# Patient Record
Sex: Female | Born: 1945
Health system: Southern US, Community
[De-identification: ages and names within clinical notes are randomized; demographics above are authoritative.]

## PROBLEM LIST (undated history)

## (undated) DIAGNOSIS — I252 Old myocardial infarction: Secondary | ICD-10-CM

## (undated) DIAGNOSIS — I200 Unstable angina: Secondary | ICD-10-CM

## (undated) DIAGNOSIS — E669 Obesity, unspecified: Secondary | ICD-10-CM

## (undated) DIAGNOSIS — R519 Headache, unspecified: Secondary | ICD-10-CM

## (undated) DIAGNOSIS — I219 Acute myocardial infarction, unspecified: Secondary | ICD-10-CM

## (undated) DIAGNOSIS — F329 Major depressive disorder, single episode, unspecified: Secondary | ICD-10-CM

## (undated) DIAGNOSIS — R112 Nausea with vomiting, unspecified: Secondary | ICD-10-CM

## (undated) DIAGNOSIS — I2510 Atherosclerotic heart disease of native coronary artery without angina pectoris: Secondary | ICD-10-CM

## (undated) DIAGNOSIS — Z9289 Personal history of other medical treatment: Secondary | ICD-10-CM

## (undated) DIAGNOSIS — R739 Hyperglycemia, unspecified: Secondary | ICD-10-CM

## (undated) DIAGNOSIS — D649 Anemia, unspecified: Secondary | ICD-10-CM

## (undated) DIAGNOSIS — F32A Depression, unspecified: Secondary | ICD-10-CM

## (undated) DIAGNOSIS — H269 Unspecified cataract: Secondary | ICD-10-CM

## (undated) DIAGNOSIS — C801 Malignant (primary) neoplasm, unspecified: Secondary | ICD-10-CM

## (undated) DIAGNOSIS — Z9889 Other specified postprocedural states: Secondary | ICD-10-CM

## (undated) DIAGNOSIS — G4730 Sleep apnea, unspecified: Secondary | ICD-10-CM

## (undated) DIAGNOSIS — G5600 Carpal tunnel syndrome, unspecified upper limb: Secondary | ICD-10-CM

## (undated) DIAGNOSIS — Z951 Presence of aortocoronary bypass graft: Secondary | ICD-10-CM

## (undated) DIAGNOSIS — I10 Essential (primary) hypertension: Secondary | ICD-10-CM

## (undated) DIAGNOSIS — N2889 Other specified disorders of kidney and ureter: Secondary | ICD-10-CM

## (undated) DIAGNOSIS — I4891 Unspecified atrial fibrillation: Secondary | ICD-10-CM

## (undated) DIAGNOSIS — I499 Cardiac arrhythmia, unspecified: Secondary | ICD-10-CM

## (undated) DIAGNOSIS — N200 Calculus of kidney: Secondary | ICD-10-CM

## (undated) DIAGNOSIS — M1990 Unspecified osteoarthritis, unspecified site: Secondary | ICD-10-CM

## (undated) DIAGNOSIS — F419 Anxiety disorder, unspecified: Secondary | ICD-10-CM

## (undated) DIAGNOSIS — Z8719 Personal history of other diseases of the digestive system: Secondary | ICD-10-CM

## (undated) DIAGNOSIS — R51 Headache: Secondary | ICD-10-CM

## (undated) DIAGNOSIS — Z8489 Family history of other specified conditions: Secondary | ICD-10-CM

## (undated) DIAGNOSIS — I209 Angina pectoris, unspecified: Secondary | ICD-10-CM

## (undated) DIAGNOSIS — E785 Hyperlipidemia, unspecified: Secondary | ICD-10-CM

## (undated) DIAGNOSIS — K219 Gastro-esophageal reflux disease without esophagitis: Secondary | ICD-10-CM

## (undated) HISTORY — PX: LUMBAR SPINE SURGERY: SHX701

## (undated) HISTORY — PX: OTHER SURGICAL HISTORY: SHX169

## (undated) HISTORY — PX: ABDOMINAL HYSTERECTOMY: SUR658

## (undated) HISTORY — PX: BLADDER SURGERY: SHX569

## (undated) HISTORY — DX: Anxiety disorder, unspecified: F41.9

## (undated) HISTORY — PX: APPENDECTOMY: SHX54

## (undated) HISTORY — DX: Carpal tunnel syndrome, unspecified upper limb: G56.00

## (undated) HISTORY — DX: Acute myocardial infarction, unspecified: I21.9

## (undated) HISTORY — DX: Sleep apnea, unspecified: G47.30

## (undated) HISTORY — PX: CATARACT EXTRACTION W/ INTRAOCULAR LENS  IMPLANT, BILATERAL: SHX1307

## (undated) HISTORY — DX: Gastro-esophageal reflux disease without esophagitis: K21.9

## (undated) HISTORY — DX: Unspecified cataract: H26.9

## (undated) HISTORY — DX: Hyperlipidemia, unspecified: E78.5

## (undated) HISTORY — PX: UPPER GASTROINTESTINAL ENDOSCOPY: SHX188

## (undated) HISTORY — PX: INGUINAL HERNIA REPAIR: SUR1180

## (undated) HISTORY — DX: Calculus of kidney: N20.0

## (undated) HISTORY — PX: LAPAROSCOPIC CHOLECYSTECTOMY: SUR755

## (undated) HISTORY — PX: INCONTINENCE SURGERY: SHX676

## (undated) HISTORY — DX: Atherosclerotic heart disease of native coronary artery without angina pectoris: I25.10

## (undated) HISTORY — DX: Anemia, unspecified: D64.9

## (undated) HISTORY — PX: BACK SURGERY: SHX140

## (undated) HISTORY — PX: CARPAL TUNNEL RELEASE: SHX101

## (undated) HISTORY — DX: Obesity, unspecified: E66.9

## (undated) HISTORY — PX: LUMBAR DISC SURGERY: SHX700

## (undated) HISTORY — DX: Unspecified osteoarthritis, unspecified site: M19.90

## (undated) HISTORY — DX: Unstable angina: I20.0

## (undated) HISTORY — DX: Hyperglycemia, unspecified: R73.9

## (undated) HISTORY — PX: CHOLECYSTECTOMY: SHX55

---

## 1997-05-25 DIAGNOSIS — I252 Old myocardial infarction: Secondary | ICD-10-CM

## 1997-05-25 HISTORY — DX: Old myocardial infarction: I25.2

## 1997-05-25 HISTORY — PX: CORONARY ANGIOPLASTY WITH STENT PLACEMENT: SHX49

## 1999-10-20 ENCOUNTER — Ambulatory Visit (HOSPITAL_COMMUNITY): Admission: RE | Admit: 1999-10-20 | Discharge: 1999-10-20 | Payer: Self-pay | Admitting: Neurological Surgery

## 1999-10-20 ENCOUNTER — Encounter: Payer: Self-pay | Admitting: Neurological Surgery

## 2000-08-14 ENCOUNTER — Encounter: Payer: Self-pay | Admitting: Neurological Surgery

## 2000-08-14 ENCOUNTER — Ambulatory Visit (HOSPITAL_COMMUNITY): Admission: RE | Admit: 2000-08-14 | Discharge: 2000-08-14 | Payer: Self-pay | Admitting: Neurological Surgery

## 2000-09-30 ENCOUNTER — Encounter: Payer: Self-pay | Admitting: Neurological Surgery

## 2000-10-04 ENCOUNTER — Ambulatory Visit (HOSPITAL_COMMUNITY): Admission: RE | Admit: 2000-10-04 | Discharge: 2000-10-05 | Payer: Self-pay | Admitting: Neurological Surgery

## 2000-10-04 ENCOUNTER — Encounter: Payer: Self-pay | Admitting: Neurological Surgery

## 2006-01-14 ENCOUNTER — Ambulatory Visit: Payer: Self-pay | Admitting: Family Medicine

## 2006-04-30 ENCOUNTER — Ambulatory Visit: Payer: Self-pay | Admitting: Family Medicine

## 2006-07-13 ENCOUNTER — Ambulatory Visit: Payer: Self-pay | Admitting: Family Medicine

## 2006-07-20 ENCOUNTER — Ambulatory Visit: Payer: Self-pay | Admitting: Family Medicine

## 2006-07-20 LAB — CONVERTED CEMR LAB
Cholesterol: 246 mg/dL (ref 0–200)
Direct LDL: 182.7 mg/dL
HDL: 47 mg/dL (ref >39.0)
VLDL: 33 mg/dL (ref 0–40)

## 2006-08-04 ENCOUNTER — Ambulatory Visit: Payer: Self-pay | Admitting: Gastroenterology

## 2006-08-10 ENCOUNTER — Ambulatory Visit: Payer: Self-pay | Admitting: Cardiology

## 2006-08-17 ENCOUNTER — Ambulatory Visit: Payer: Self-pay | Admitting: Family Medicine

## 2006-08-18 ENCOUNTER — Ambulatory Visit: Payer: Self-pay

## 2006-08-23 ENCOUNTER — Encounter (INDEPENDENT_AMBULATORY_CARE_PROVIDER_SITE_OTHER): Payer: Self-pay | Admitting: Specialist

## 2006-08-23 ENCOUNTER — Ambulatory Visit: Payer: Self-pay | Admitting: Gastroenterology

## 2006-09-20 ENCOUNTER — Ambulatory Visit: Payer: Self-pay | Admitting: Family Medicine

## 2006-09-20 LAB — CONVERTED CEMR LAB
ALT: 18 units/L (ref 0–40)
Bilirubin, Direct: 0.1 mg/dL (ref 0.0–0.3)
Cholesterol: 251 mg/dL (ref 0–200)
Direct LDL: 180.3 mg/dL
HDL: 43.6 mg/dL (ref >39.0)
Total CHOL/HDL Ratio: 5.8
Total Protein: 6.8 g/dL (ref 6.0–8.3)
VLDL: 36 mg/dL (ref 0–40)

## 2006-10-20 ENCOUNTER — Encounter: Admission: RE | Admit: 2006-10-20 | Discharge: 2006-10-20 | Payer: Self-pay | Admitting: Family Medicine

## 2006-10-22 ENCOUNTER — Encounter (INDEPENDENT_AMBULATORY_CARE_PROVIDER_SITE_OTHER): Payer: Self-pay | Admitting: *Deleted

## 2006-11-04 DIAGNOSIS — F419 Anxiety disorder, unspecified: Secondary | ICD-10-CM | POA: Insufficient documentation

## 2006-11-04 DIAGNOSIS — I252 Old myocardial infarction: Secondary | ICD-10-CM

## 2006-11-04 DIAGNOSIS — F411 Generalized anxiety disorder: Secondary | ICD-10-CM

## 2006-11-04 DIAGNOSIS — F329 Major depressive disorder, single episode, unspecified: Secondary | ICD-10-CM

## 2006-11-04 DIAGNOSIS — I2510 Atherosclerotic heart disease of native coronary artery without angina pectoris: Secondary | ICD-10-CM | POA: Insufficient documentation

## 2006-11-04 DIAGNOSIS — I10 Essential (primary) hypertension: Secondary | ICD-10-CM | POA: Insufficient documentation

## 2006-11-09 ENCOUNTER — Ambulatory Visit: Payer: Self-pay | Admitting: Family Medicine

## 2006-11-09 DIAGNOSIS — E7800 Pure hypercholesterolemia, unspecified: Secondary | ICD-10-CM | POA: Insufficient documentation

## 2006-11-09 DIAGNOSIS — G4709 Other insomnia: Secondary | ICD-10-CM

## 2006-11-09 DIAGNOSIS — R350 Frequency of micturition: Secondary | ICD-10-CM

## 2006-11-09 LAB — CONVERTED CEMR LAB
Casts: 0 /lpf
Urine crystals, microscopic: 0 /hpf

## 2007-01-17 ENCOUNTER — Ambulatory Visit: Payer: Self-pay | Admitting: Family Medicine

## 2007-01-17 LAB — CONVERTED CEMR LAB
Glucose, Urine, Semiquant: NEGATIVE
Ketones, urine, test strip: NEGATIVE
Specific Gravity, Urine: >=1.03
WBC Urine, dipstick: NEGATIVE
Yeast, UA: 0
pH: 6

## 2007-01-18 ENCOUNTER — Encounter: Payer: Self-pay | Admitting: Family Medicine

## 2007-01-19 LAB — CONVERTED CEMR LAB
Albumin: 3.7 g/dL (ref 3.5–5.2)
Calcium: 9.1 mg/dL (ref 8.4–10.5)
Chloride: 108 meq/L (ref 96–112)
Creatinine, Ser: 0.8 mg/dL (ref 0.4–1.2)
GFR calc Af Amer: 94 mL/min
GFR calc non Af Amer: 78 mL/min

## 2007-01-26 ENCOUNTER — Telehealth (INDEPENDENT_AMBULATORY_CARE_PROVIDER_SITE_OTHER): Payer: Self-pay | Admitting: *Deleted

## 2007-01-31 ENCOUNTER — Telehealth (INDEPENDENT_AMBULATORY_CARE_PROVIDER_SITE_OTHER): Payer: Self-pay | Admitting: *Deleted

## 2007-02-02 ENCOUNTER — Telehealth (INDEPENDENT_AMBULATORY_CARE_PROVIDER_SITE_OTHER): Payer: Self-pay | Admitting: *Deleted

## 2007-02-04 ENCOUNTER — Encounter: Payer: Self-pay | Admitting: Family Medicine

## 2007-02-24 ENCOUNTER — Telehealth (INDEPENDENT_AMBULATORY_CARE_PROVIDER_SITE_OTHER): Payer: Self-pay | Admitting: *Deleted

## 2007-03-14 ENCOUNTER — Telehealth (INDEPENDENT_AMBULATORY_CARE_PROVIDER_SITE_OTHER): Payer: Self-pay | Admitting: *Deleted

## 2007-03-29 ENCOUNTER — Telehealth: Payer: Self-pay | Admitting: Family Medicine

## 2007-05-13 ENCOUNTER — Ambulatory Visit: Payer: Self-pay | Admitting: Family Medicine

## 2007-06-03 ENCOUNTER — Ambulatory Visit: Payer: Self-pay | Admitting: Family Medicine

## 2007-06-06 LAB — CONVERTED CEMR LAB
AST: 23 units/L (ref 0–37)
Albumin: 4.1 g/dL (ref 3.5–5.2)
BUN: 18 mg/dL (ref 6–23)
Basophils Absolute: 0 10*3/uL (ref 0.0–0.1)
Calcium: 9.5 mg/dL (ref 8.4–10.5)
Cholesterol: 283 mg/dL (ref 0–200)
Eosinophils Absolute: 0.3 10*3/uL (ref 0.0–0.6)
GFR calc Af Amer: 82 mL/min
Glucose, Bld: 119 mg/dL — ABNORMAL HIGH (ref 70–99)
HDL: 42.3 mg/dL (ref >39.0)
MCHC: 34.1 g/dL (ref 30.0–36.0)
MCV: 95.5 fL (ref 78.0–100.0)
Monocytes Relative: 11.3 % — ABNORMAL HIGH (ref 3.0–11.0)
Neutrophils Relative %: 58 % (ref 43.0–77.0)
Platelets: 251 10*3/uL (ref 150–400)
Potassium: 4.7 meq/L (ref 3.5–5.1)
RBC: 4.48 M/uL (ref 3.87–5.11)
Triglycerides: 148 mg/dL (ref 0–149)

## 2007-06-22 ENCOUNTER — Ambulatory Visit: Payer: Self-pay | Admitting: Family Medicine

## 2007-08-02 ENCOUNTER — Telehealth: Payer: Self-pay | Admitting: Family Medicine

## 2007-08-03 ENCOUNTER — Telehealth: Payer: Self-pay | Admitting: Family Medicine

## 2007-08-08 ENCOUNTER — Telehealth: Payer: Self-pay | Admitting: Family Medicine

## 2007-08-09 ENCOUNTER — Telehealth: Payer: Self-pay | Admitting: Family Medicine

## 2007-09-14 ENCOUNTER — Ambulatory Visit: Payer: Self-pay | Admitting: Cardiology

## 2007-09-21 ENCOUNTER — Encounter: Payer: Self-pay | Admitting: Family Medicine

## 2007-10-04 ENCOUNTER — Ambulatory Visit: Payer: Self-pay | Admitting: Family Medicine

## 2007-10-10 ENCOUNTER — Telehealth: Payer: Self-pay | Admitting: Family Medicine

## 2007-10-10 ENCOUNTER — Ambulatory Visit (HOSPITAL_COMMUNITY): Admission: RE | Admit: 2007-10-10 | Discharge: 2007-10-10 | Payer: Self-pay | Admitting: Family Medicine

## 2007-10-11 DIAGNOSIS — R05 Cough: Secondary | ICD-10-CM

## 2007-10-19 ENCOUNTER — Ambulatory Visit: Payer: Self-pay | Admitting: Pulmonary Disease

## 2007-10-26 ENCOUNTER — Encounter: Admission: RE | Admit: 2007-10-26 | Discharge: 2007-10-26 | Payer: Self-pay | Admitting: Family Medicine

## 2007-10-28 ENCOUNTER — Encounter (INDEPENDENT_AMBULATORY_CARE_PROVIDER_SITE_OTHER): Payer: Self-pay | Admitting: *Deleted

## 2008-01-06 ENCOUNTER — Ambulatory Visit: Payer: Self-pay | Admitting: Family Medicine

## 2008-01-06 DIAGNOSIS — R7989 Other specified abnormal findings of blood chemistry: Secondary | ICD-10-CM | POA: Insufficient documentation

## 2008-01-11 LAB — CONVERTED CEMR LAB
Albumin: 3.8 g/dL (ref 3.5–5.2)
CO2: 27 meq/L (ref 19–32)
Calcium: 8.7 mg/dL (ref 8.4–10.5)
GFR calc Af Amer: 82 mL/min
GFR calc non Af Amer: 68 mL/min
HDL: 36.6 mg/dL — ABNORMAL LOW (ref >39.0)
Sodium: 140 meq/L (ref 135–145)
Total CHOL/HDL Ratio: 7.3
Triglycerides: 117 mg/dL (ref 0–149)

## 2008-02-20 DIAGNOSIS — Z860100 Personal history of colon polyps, unspecified: Secondary | ICD-10-CM | POA: Insufficient documentation

## 2008-02-20 DIAGNOSIS — K5730 Diverticulosis of large intestine without perforation or abscess without bleeding: Secondary | ICD-10-CM | POA: Insufficient documentation

## 2008-02-21 ENCOUNTER — Ambulatory Visit: Payer: Self-pay | Admitting: Gastroenterology

## 2008-02-21 DIAGNOSIS — K921 Melena: Secondary | ICD-10-CM

## 2008-09-04 ENCOUNTER — Encounter (INDEPENDENT_AMBULATORY_CARE_PROVIDER_SITE_OTHER): Payer: Self-pay | Admitting: *Deleted

## 2008-09-13 ENCOUNTER — Ambulatory Visit: Payer: Self-pay | Admitting: Family Medicine

## 2008-09-14 LAB — CONVERTED CEMR LAB
Alkaline Phosphatase: 55 units/L (ref 39–117)
Bilirubin, Direct: 0.2 mg/dL (ref 0.0–0.3)
Direct LDL: 224.2 mg/dL
Glucose, Bld: 98 mg/dL (ref 70–99)
HDL: 42.3 mg/dL (ref >39.00)
Hgb A1c MFr Bld: 5.8 % (ref 4.6–6.5)
Phosphorus: 3.5 mg/dL (ref 2.3–4.6)
Potassium: 4.5 meq/L (ref 3.5–5.1)
Sodium: 143 meq/L (ref 135–145)
Total CHOL/HDL Ratio: 7
VLDL: 30.8 mg/dL (ref 0.0–40.0)

## 2008-09-15 DIAGNOSIS — K449 Diaphragmatic hernia without obstruction or gangrene: Secondary | ICD-10-CM | POA: Insufficient documentation

## 2008-09-15 DIAGNOSIS — E785 Hyperlipidemia, unspecified: Secondary | ICD-10-CM | POA: Insufficient documentation

## 2008-09-15 DIAGNOSIS — M129 Arthropathy, unspecified: Secondary | ICD-10-CM | POA: Insufficient documentation

## 2008-09-17 ENCOUNTER — Ambulatory Visit: Payer: Self-pay | Admitting: Family Medicine

## 2008-09-17 DIAGNOSIS — J069 Acute upper respiratory infection, unspecified: Secondary | ICD-10-CM | POA: Insufficient documentation

## 2008-09-17 DIAGNOSIS — E119 Type 2 diabetes mellitus without complications: Secondary | ICD-10-CM

## 2008-10-01 ENCOUNTER — Encounter (INDEPENDENT_AMBULATORY_CARE_PROVIDER_SITE_OTHER): Payer: Self-pay | Admitting: *Deleted

## 2008-11-21 ENCOUNTER — Encounter: Admission: RE | Admit: 2008-11-21 | Discharge: 2008-11-21 | Payer: Self-pay | Admitting: Family Medicine

## 2008-11-27 ENCOUNTER — Encounter (INDEPENDENT_AMBULATORY_CARE_PROVIDER_SITE_OTHER): Payer: Self-pay | Admitting: *Deleted

## 2008-11-30 ENCOUNTER — Ambulatory Visit: Payer: Self-pay | Admitting: Family Medicine

## 2008-12-02 LAB — CONVERTED CEMR LAB
BUN: 17 mg/dL (ref 6–23)
Chloride: 107 meq/L (ref 96–112)
Glucose, Bld: 99 mg/dL (ref 70–99)
Phosphorus: 3.9 mg/dL (ref 2.3–4.6)
Potassium: 4.6 meq/L (ref 3.5–5.1)

## 2009-03-04 ENCOUNTER — Telehealth: Payer: Self-pay | Admitting: Family Medicine

## 2009-03-04 DIAGNOSIS — R5381 Other malaise: Secondary | ICD-10-CM | POA: Insufficient documentation

## 2009-03-04 DIAGNOSIS — R5383 Other fatigue: Secondary | ICD-10-CM

## 2009-03-05 ENCOUNTER — Telehealth: Payer: Self-pay | Admitting: Family Medicine

## 2009-03-08 ENCOUNTER — Encounter (INDEPENDENT_AMBULATORY_CARE_PROVIDER_SITE_OTHER): Payer: Self-pay | Admitting: *Deleted

## 2009-04-04 ENCOUNTER — Encounter (INDEPENDENT_AMBULATORY_CARE_PROVIDER_SITE_OTHER): Payer: Self-pay | Admitting: *Deleted

## 2009-04-09 ENCOUNTER — Encounter (INDEPENDENT_AMBULATORY_CARE_PROVIDER_SITE_OTHER): Payer: Self-pay | Admitting: *Deleted

## 2009-06-18 ENCOUNTER — Encounter: Payer: Self-pay | Admitting: Family Medicine

## 2009-07-04 ENCOUNTER — Encounter (INDEPENDENT_AMBULATORY_CARE_PROVIDER_SITE_OTHER): Payer: Self-pay | Admitting: *Deleted

## 2009-09-04 ENCOUNTER — Ambulatory Visit: Payer: Self-pay | Admitting: Gastroenterology

## 2009-09-06 ENCOUNTER — Telehealth (INDEPENDENT_AMBULATORY_CARE_PROVIDER_SITE_OTHER): Payer: Self-pay | Admitting: *Deleted

## 2009-12-06 ENCOUNTER — Ambulatory Visit: Payer: Self-pay | Admitting: Internal Medicine

## 2009-12-20 ENCOUNTER — Ambulatory Visit: Payer: Self-pay | Admitting: Surgery

## 2010-04-29 ENCOUNTER — Telehealth: Payer: Self-pay | Admitting: Gastroenterology

## 2010-06-14 ENCOUNTER — Encounter: Payer: Self-pay | Admitting: Internal Medicine

## 2010-06-15 ENCOUNTER — Encounter: Payer: Self-pay | Admitting: Internal Medicine

## 2010-06-16 ENCOUNTER — Encounter: Payer: Self-pay | Admitting: Family Medicine

## 2010-06-24 NOTE — Letter (Signed)
Summary: Note from pt. to schedule her appts.  Note from pt. to schedule her appts.   Imported By: Beau Fanny 06/19/2009 16:52:15  _____________________________________________________________________  External Attachment:    Type:   Image     Comment:   External Document

## 2010-06-24 NOTE — Progress Notes (Signed)
 ----   Converted from flag ---- ---- 09/06/2009 11:44 AM, Christie Nottingham CMA (AAMA) wrote: lol yes. he tells me to bill everyone who does not call and does not show up. It's like a standing order.   ---- 09/06/2009 11:09 AM, Leanor Kail [facility] wrote: Per Dr Russella Dar?  ---- 09/06/2009 11:08 AM, Christie Nottingham CMA (AAMA) wrote: sorry, you can bill  ---- 09/06/2009 10:18 AM, Leanor Kail [facility] wrote: No Show 09-04-09 rev ------------------------------    Patient BILLED. Leanor Kail [facility]  September 06, 2009 12:29 PM

## 2010-06-24 NOTE — Letter (Signed)
Summary: Colonoscopy Letter  [facility]  [address] [place], [place] [address]   Phone: [phone]  Fax: [phone]      July 04, 2009 MRN: [id number]   [facility] [address] [place], [place]  [address]   Dear Ms. Mangas,   According to your medical record, it is time for you to schedule a Colonoscopy. The American Cancer Society recommends this procedure as a method to detect early colon cancer. Patients with a family history of colon cancer, or a personal history of colon polyps or inflammatory bowel disease are at increased risk.  This letter has beeen generated based on the recommendations made at the time of your procedure. If you feel that in your particular situation this may no longer apply, please contact our office.  Please call our office at [phone] to schedule this appointment or to update your records at your earliest convenience.  Thank you for cooperating with [place] to provide you with the very best care possible.   Sincerely,  Judie Petit T. Russella Dar, M.D.  [facility] [phone]

## 2010-06-26 NOTE — Progress Notes (Signed)
Summary: Schedule Recall Colonoscopy   Phone Note Outgoing Call Call back at Home Phone [phone]   Call placed by: Harlow Mares CMA Duncan Dull),  April 29, 2010 11:28 AM Call placed to: Patient Summary of Call: Left a message on patients machine to call back, patient due for a colonoscopy due to previous adenomatous polyps.  Initial call taken by: Harlow Mares CMA Duncan Dull),  April 29, 2010 11:29 AM  Follow-up for Phone Call        Left a message on the patient machine to call back and schedule a previsit and procedure with our office. A letter will be mailed to the patient.   Follow-up by: Harlow Mares CMA Duncan Dull),  May 21, 2010 9:25 AM

## 2010-07-23 ENCOUNTER — Inpatient Hospital Stay: Payer: Self-pay | Admitting: Internal Medicine

## 2010-08-18 ENCOUNTER — Other Ambulatory Visit: Payer: Self-pay | Admitting: Family Medicine

## 2010-10-07 NOTE — Assessment & Plan Note (Signed)
[facility]                            CARDIOLOGY OFFICE NOTE   ZAMIA, TYMINSKI                 MRN:          [id number]  DATE:09/14/2007                            DOB:          22-Feb-1946    HISTORY:  Debbie Delacruz is a very pleasant 65 year old female with past  medical history of coronary disease, hypertension, hyperlipidemia.  Her  most recent Myoview was performed on August 18, 2006.  At that, time she  had an ejection fraction of 68% and there was no ischemia or infarction.  Since I last saw her, she denies any dyspnea, although she has had a  chronic cough since January that is being evaluated by Dr. Milinda Antis.  She  has not had chest pain or pedal edema.  There has been no syncope.  She  did not follow up in the lipid clinic.   SOCIAL HISTORY:  Note she does not smoke.   MEDICATIONS:  1. Zetia 10 mg p.o. daily.  2. Diovan 160 mg p.o. daily.  3. Prozac 40 mg p.o. daily.  4. Aspirin 81 mg p.o. daily.  5. Crestor 40 mg p.o. daily.  6. Toprol 150 mg p.o. daily.   PHYSICAL EXAMINATION:  VITAL SIGNS:  Blood pressure of 179/92, but she  states she is not taking her blood pressure medications, pulse is 94.  She weighs 215 pounds.  HEENT:  Normal.  NECK:  Supple with no bruits.  CHEST:  Clear.  CARDIOVASCULAR:  Regular rate and rhythm.  ABDOMEN:  Shows no tenderness.  EXTREMITIES:  No edema.   DIAGNOSTICS:  Electrogram shows sinus rhythm at a rate of 93.  There are  no significant ST changes.   DIAGNOSES:  1. Coronary artery disease - Mrs. Karow has had no chest pain or      shortness of breath.  We will continue with medical therapy      including her aspirin, ARB, statin and beta blocker.  She will need      to continue with risk factor modification.  Note she does not      smoke.  We discussed the importance of diet and exercise.  2. Hypertension - her blood pressure is elevated today, but she has      not taken her  medications.  I have asked her to track this at home.      If her systolic blood pressure runs higher than 130 or diastolic      higher than 85, then she will contact us and we could increase her      Diovan at that time.  3. Hyperlipidemia - she has severe hyperlipidemia.  She will continue      on her Crestor and Zetia.  I have asked her to follow up with the      lipid clinic (she did not do this previously).  There appears to be      difficulty in coming to the office due to her employment.  I have      offered that they could potentially give her suggestions concerning      additional medications and  follow her less frequently.  4. History of anxiety.   FOLLOW UP:  We will see her back in 12 months.     Madolyn Frieze Jens Som, MD, [facility]  Electronically Signed    BSC/MedQ  DD: 09/14/2007  DT: 09/14/2007  Job #: [id number]   cc:   Marne A. Milinda Antis, MD

## 2010-10-10 NOTE — Assessment & Plan Note (Signed)
[facility]                         GASTROENTEROLOGY OFFICE NOTE   LAFREDA, CASEBEER                 MRN:          [id number]  DATE:08/04/2006                            DOB:          07/05/1945    REFERRING PHYSICIAN:  Marne A. Tower, MD   REASON FOR REFERRAL:  Personal history of adenomatous colon polyps and  intermittent diarrhea.   HISTORY OF PRESENT ILLNESS:  Mrs.  Kilty is a 65 year old white  female, referred through the courtesy of Dr. Roxy Manns.  She has a  sister with ulcerative colitis, who developed colon cancer at age 82.  Mrs. Stenglein has had colon polyps found on prior colonoscopies by Dr.  Lynnae Prude.  She states she was recommended to have colonoscopies  every two years, but her last colonoscopy was about four to five years  ago.  I do not have records from her prior colonoscopies or pathology at  the time of this dictation.  She relates a history of hemorrhoids, but  no current active hemorrhoidal symptoms.  She has infrequent episodes of  heartburn and indigestion, occurring maybe once every two months.  She  has intermittent lower abdominal discomfort, associated with loose  stools when she eats salads, greens and other foods.  These symptoms  have been present for many years.  She relates no hematochezia, weight-  loss, change in stool caliber, change in bowel habits.   CURRENT MEDICATIONS:  Listed on the chart, updated and reviewed.   MEDICATION ALLERGIES:  None known.   PAST MEDICAL HISTORY:  Coronary artery disease, status post stent  placement in 2000.  Status post myocardial infarction, 2000.  Hyperlipidemia.  Status post cholecystectomy.  Status post right  inguinal hernia repairs times five.  Status post appendectomy.   SOCIAL HISTORY:  Per the handwritten form.   REVIEW OF SYSTEMS:  Per the handwritten form.   PHYSICAL EXAM:  Overweight, white female, in no acute distress.  Height  5 feet 2  inches, weight 195.6 pounds.  Blood pressure is 150/96, pulse  68 and regular.  HEENT EXAM:  Anicteric sclerae.  Oropharynx clear.  CHEST:  Clear to auscultation bilaterally.  CARDIAC:  Regular rate and rhythm without murmurs appreciated.  ABDOMEN:  Soft, nontender, nondistended.  Normoactive bowel sounds, no  palpable organomegaly, masses or hernias.  RECTAL EXAMINATION:  Deferred to the time of the colonoscopy.  EXTREMITIES:  Without clubbing, cyanosis or edema.  NEUROLOGICAL:  Alert and oriented times three.  Grossly nonfocal.   ASSESSMENT AND PLAN:  Personal history of adenomatous colon polyps with  chronic mild intermittent diarrhea.  We will obtain records from Dr.  Mechele Collin.  Risks, benefits, and alternatives to colonoscopy and possible  biopsy and possible polypectomy were discussed with the patient.  She  consented to proceed.  This will be scheduled electively.     Venita Lick. Russella Dar, MD, [facility]  Electronically Signed    MTS/MedQ  DD: 08/04/2006  DT: 08/05/2006  Job #: [id number]

## 2010-10-10 NOTE — Op Note (Signed)
[facility]  Patient:    Debbie Delacruz, Debbie Delacruz                       MRN: [id number] Proc. Date: 10/04/00 Adm. Date:  [id number] Attending:  Jonne Ply                           Operative Report  PREOPERATIVE DIAGNOSIS:  L5-S1 herniated nucleus pulposus with right lumbar radiculopathy.  POSTOPERATIVE DIAGNOSIS:  L5-S1 herniated nucleus pulposus with right lumbar radiculopathy.  OPERATION PERFORMED:  Lumbar microendoscopic diskectomy, L5-S1 right with operating microscope and microdissection technique.  SURGEON:  Stefani Dama, M.D.  ASSISTANT:  Danae Orleans. Venetia Maxon, M.D.  ANESTHESIA:  General endotracheal.  INDICATIONS FOR PROCEDURE:  The patient is a 65 year old individual who has had a lumbar herniated nucleus pulposus on the right side for over a years time.  She has had a chronic right lumbar radiculopathy in an L5 distribution and was advised regarding surgery.  She is now taken to the operating room.  DESCRIPTION OF PROCEDURE:   The patient was brought to the operating room supine on the stretcher.  After a smooth induction of general endotracheal anesthesia, she was turned prone.  The back was shaved and prepped with DuraPrep and draped in a sterile fashion.  The fluoroscope was then draped and brought into the field and in the PA projections, the midline and the right interpedicular line was marked.  The disk space and the L5-S1 level was marked and then the machine was turned into the cross table position and further cross table visualization was obtained.  The area of skin at the insertion site was then infiltrated with 5 cc of 1% lidocaine with epinephrine.  A small stab incision was made and the K-wire was passed down to the laminar arch of L5 on the right side.  The dissection was then obtained bluntly by using a series of dilators over the K-wire, removing the K-wire and then using a wanding technique to dissect the muscular  tissue along the laminar arch of L5. Dilation was then performed out to the 18 mm diameter and an 18 mm x 6 cm endoscopic cannula was placed and affixed at the laminar arch of L5.  The cannula was then affixed to the table with the holding clamp and then the microscope was draped and brought into the field.  Then using microdissection technique and an Anspach drill and 2.8 mm dissecting tool, a laminotomy of the L5 vertebra was created on the right side out to the mesial wall of the facet. Redundant yellow ligament was taken up in this region.  The common dural tube was explored and the take off of the L5 nerve root was noted to be compressed and elevated dorsally off of a significant mass.  The underlying mass was noted to be a portion of calcified ligament.  After dividing some epidural veins and mobilizing the nerve medially, this ligament was opened with a small vertical incision but then a 2 and 3 mm Kerrison punch was required to removed portions of the ligament to enter into the disk space and remove a significant quantity of markedly degenerated disk material.  A series of curets, rongeurs and pituitary forceps were used to remove the disk and free up  area along the path of the L5 nerve foot.  The disk space was evacuated of a significant quantity of markedly degenerated  disk material.  In the end, the L5 nerve root was well decompressed in its travel out the foramen and the disk space was also emptied of a remarkable quantity of significantly degenerated disk material.  End plate osteophytes off the inferior margin of the body of L5 and the superior margin of the body of S1 at the take off of the L5 nerve root were also cleared.  Hemostasis from the soft tissue was obtained meticulously. Small pledgets of Gelfoam soaked in thrombin which were used to obtain hemostasis were later removed and irrigated away and then the endoscopic cannula was removed and 3-0 Vicryl suture was used in  the subcutaneous tissues to close this layer and subcuticular tissues were closed similarly.  A clear plastic dressing was placed on the skin.  The patient tolerated the procedure well. DD:  10/04/00 TD:  10/04/00 Job: [id number]

## 2010-10-10 NOTE — Assessment & Plan Note (Signed)
[facility]                            CARDIOLOGY OFFICE NOTE   Debbie Delacruz                 MRN:          [id number]  DATE:08/10/2006                            DOB:          1946-02-03    Debbie Delacruz is a very pleasant 65 year old female with a past medical  history of coronary disease, hypertension, hyperlipidemia, who presents  for establishment.  The patient's cardiac history dates back to June of  2000.  She was in [place] at the time and developed sudden onset of neck  pain, nausea, and diaphoresis.  She was seen at the [facility] emergency  room and was found to be having a myocardial infarction.  She underwent  cardiac catheterization and stents placed, but I do not have those  records available.  She has since been followed by Dr. Juliann Pares in  [place], and had her last stress test approximately 3 years ago.  However, she would prefer to be followed in [place], and presents for  further establishment.  She denies any dyspnea on exertion, orthopnea,  PND, pedal edema, palpitations, presyncope, syncope, exertional chest  pain or neck pain.   CURRENT MEDICATIONS:  1. Zetia 10 mg p.o. daily.  2. Diovan 160 p.o. daily.  3. Prozac 40 mg p.o. daily.  4. Aspirin 81 mg p.o. daily.  5. Crestor 40 mg p.o. daily.   She has no known drug allergies.   SOCIAL HISTORY:  She does not smoke and only rarely consumes alcohol.   FAMILY HISTORY:  Positive for coronary artery disease.   PAST MEDICAL HISTORY:  Significant for hypertension and hyperlipidemia,  but there is no diabetes mellitus.  She has a history of coronary  disease as outlined in the HPI.  She has had previous hernia repair x5.  She has had carpal tunnel surgery as well as cholecystectomy.  She has  also had a hysterectomy.  She has also had an appendectomy.  She has had  previous bladder surgery.  She also has a hiatal hernia and has  arthritis.   REVIEW OF  SYSTEMS:  She denies any headaches or fevers or chills.  There  is no productive cough or hemoptysis.  There is no dysphagia or  odynophagia, melena or hematochezia.  There is no dysuria or hematuria.  No seizure activity.  There is no orthopnea, PND, or pedal edema.  She  does have some pain in her joints at times.  The remaining systems are  negative.   PHYSICAL EXAMINATION:  Shows a blood pressure of 148/86, and her pulse  is 97.  She weighs 196 pounds.  She is well developed and somewhat obese.  She is in no acute distress.  Her skin is warm and dry.  She does not appear to be depressed, and there is no peripheral  clubbing.  Her back is normal.  Her HEENT is unremarkable with normal eyelids.  Her neck is supple with a normal upstroke bilaterally and I cannot  appreciate bruits.  There is no jugular venous distension and there is  no thyromegaly noted.  Her chest is clear to auscultation with normal expansion.  Her cardiovascular exam reveals a regular rate and rhythm with normal S1  and S2.  There are no murmurs, rubs, or gallops noted.  ABDOMINAL EXAM:  Not tender.  Standard positive bowel sounds.  No  hepatosplenomegaly, no masses.  There is no abdominal bruit.  She has 2+ femoral pulses bilaterally, no bruits.  EXTREMITIES:  Showed no edema and I could palpate no cords.  She has 2+  dorsalis pedis pulses bilaterally.  NEUROLOGICAL EXAM:  Grossly intact.   Her electrocardiogram shows a sinus rhythm at a rate of 97.  There are  minor, nonspecific ST changes.   DIAGNOSES:  1. Coronary artery disease with history of percutaneous coronary      interventions.  2. Hypertension.  3. Hyperlipidemia.  4. History of anxiety.   PLAN:  Debbie Delacruz presents for establishment.  It has been 3 years  since her previous stress test, and we will plan to proceed with a  stress Myoview for risk stratification.  If it shows no ischemia, we  will continue with medical therapy.  We will  also obtain the records  from [place] concerning her previous cardiac anatomy and previous stent  placement.  We discussed risk factor modification today, including diet  and exercise.  Note, also, she has severe hyperlipidemia, and her LDL  most recently was 183 on Crestor and Zetia by her report.  I will have  her followed in the lipid clinic.  Her blood pressure is elevated today,  and I will add Toprol 50 mg p.o. daily.  We will see her back in 6  months or sooner if necessary.     Debbie Frieze Jens Som, MD, [facility]  Electronically Signed    BSC/MedQ  DD: 08/10/2006  DT: 08/11/2006  Job #: [id number]   cc:   Marne A. Milinda Antis, MD

## 2011-07-30 DIAGNOSIS — R51 Headache: Secondary | ICD-10-CM | POA: Insufficient documentation

## 2011-07-30 DIAGNOSIS — R519 Headache, unspecified: Secondary | ICD-10-CM | POA: Insufficient documentation

## 2011-08-24 ENCOUNTER — Encounter: Payer: Self-pay | Admitting: Gastroenterology

## 2011-09-14 DIAGNOSIS — D649 Anemia, unspecified: Secondary | ICD-10-CM | POA: Insufficient documentation

## 2011-12-03 ENCOUNTER — Ambulatory Visit: Payer: Self-pay | Admitting: Family Medicine

## 2012-08-29 ENCOUNTER — Emergency Department (HOSPITAL_COMMUNITY): Payer: 59

## 2012-08-29 ENCOUNTER — Encounter (HOSPITAL_COMMUNITY): Payer: Self-pay | Admitting: *Deleted

## 2012-08-29 ENCOUNTER — Observation Stay (HOSPITAL_COMMUNITY)
Admission: EM | Admit: 2012-08-29 | Discharge: 2012-08-30 | Disposition: A | Discharge status: Home / Self Care | Payer: 59 | Attending: Internal Medicine | Admitting: Internal Medicine

## 2012-08-29 DIAGNOSIS — F329 Major depressive disorder, single episode, unspecified: Secondary | ICD-10-CM

## 2012-08-29 DIAGNOSIS — M129 Arthropathy, unspecified: Secondary | ICD-10-CM

## 2012-08-29 DIAGNOSIS — R350 Frequency of micturition: Secondary | ICD-10-CM

## 2012-08-29 DIAGNOSIS — R7989 Other specified abnormal findings of blood chemistry: Secondary | ICD-10-CM

## 2012-08-29 DIAGNOSIS — R7309 Other abnormal glucose: Secondary | ICD-10-CM | POA: Insufficient documentation

## 2012-08-29 DIAGNOSIS — F411 Generalized anxiety disorder: Secondary | ICD-10-CM

## 2012-08-29 DIAGNOSIS — K5730 Diverticulosis of large intestine without perforation or abscess without bleeding: Secondary | ICD-10-CM

## 2012-08-29 DIAGNOSIS — E7800 Pure hypercholesterolemia, unspecified: Secondary | ICD-10-CM

## 2012-08-29 DIAGNOSIS — Z9861 Coronary angioplasty status: Secondary | ICD-10-CM | POA: Insufficient documentation

## 2012-08-29 DIAGNOSIS — R05 Cough: Secondary | ICD-10-CM

## 2012-08-29 DIAGNOSIS — I2510 Atherosclerotic heart disease of native coronary artery without angina pectoris: Secondary | ICD-10-CM | POA: Insufficient documentation

## 2012-08-29 DIAGNOSIS — J069 Acute upper respiratory infection, unspecified: Secondary | ICD-10-CM

## 2012-08-29 DIAGNOSIS — Z86010 Personal history of colonic polyps: Secondary | ICD-10-CM

## 2012-08-29 DIAGNOSIS — K449 Diaphragmatic hernia without obstruction or gangrene: Secondary | ICD-10-CM

## 2012-08-29 DIAGNOSIS — R55 Syncope and collapse: Principal | ICD-10-CM | POA: Diagnosis present

## 2012-08-29 DIAGNOSIS — R5381 Other malaise: Secondary | ICD-10-CM

## 2012-08-29 DIAGNOSIS — E119 Type 2 diabetes mellitus without complications: Secondary | ICD-10-CM

## 2012-08-29 DIAGNOSIS — G4709 Other insomnia: Secondary | ICD-10-CM

## 2012-08-29 DIAGNOSIS — I252 Old myocardial infarction: Secondary | ICD-10-CM | POA: Insufficient documentation

## 2012-08-29 DIAGNOSIS — E785 Hyperlipidemia, unspecified: Secondary | ICD-10-CM | POA: Insufficient documentation

## 2012-08-29 DIAGNOSIS — I10 Essential (primary) hypertension: Secondary | ICD-10-CM | POA: Insufficient documentation

## 2012-08-29 DIAGNOSIS — K921 Melena: Secondary | ICD-10-CM

## 2012-08-29 DIAGNOSIS — R197 Diarrhea, unspecified: Secondary | ICD-10-CM | POA: Diagnosis present

## 2012-08-29 HISTORY — DX: Old myocardial infarction: I25.2

## 2012-08-29 HISTORY — DX: Atherosclerotic heart disease of native coronary artery without angina pectoris: I25.10

## 2012-08-29 HISTORY — DX: Essential (primary) hypertension: I10

## 2012-08-29 LAB — CBC WITH DIFFERENTIAL/PLATELET
Eosinophils Absolute: 0.3 10*3/uL (ref 0.0–0.7)
Eosinophils Relative: 4 % (ref 0–5)
HCT: 40.5 % (ref 36.0–46.0)
Hemoglobin: 13.7 g/dL (ref 12.0–15.0)
Lymphocytes Relative: 22 % (ref 12–46)
Lymphs Abs: 1.6 10*3/uL (ref 0.7–4.0)
MCH: 30 pg (ref 26.0–34.0)
MCV: 88.6 fL (ref 78.0–100.0)
Monocytes Absolute: 0.6 10*3/uL (ref 0.1–1.0)
Monocytes Relative: 8 % (ref 3–12)
RBC: 4.57 MIL/uL (ref 3.87–5.11)

## 2012-08-29 LAB — TROPONIN I
Troponin I: <0.3 ng/mL (ref <0.30)
Troponin I: <0.3 ng/mL (ref <0.30)

## 2012-08-29 LAB — COMPREHENSIVE METABOLIC PANEL
ALT: 28 U/L (ref 0–35)
AST: 30 U/L (ref 0–37)
Albumin: 3.7 g/dL (ref 3.5–5.2)
CO2: 25 mEq/L (ref 19–32)
Calcium: 8.9 mg/dL (ref 8.4–10.5)
Chloride: 102 mEq/L (ref 96–112)
GFR calc non Af Amer: 88 mL/min — ABNORMAL LOW (ref >90)
Sodium: 137 mEq/L (ref 135–145)

## 2012-08-29 LAB — URINALYSIS, ROUTINE W REFLEX MICROSCOPIC
Bilirubin Urine: NEGATIVE
Glucose, UA: NEGATIVE mg/dL
Hgb urine dipstick: NEGATIVE
Specific Gravity, Urine: 1.012 (ref 1.005–1.030)
Urobilinogen, UA: 0.2 mg/dL (ref 0.0–1.0)
pH: 7 (ref 5.0–8.0)

## 2012-08-29 LAB — CREATININE, SERUM
Creatinine, Ser: 0.84 mg/dL (ref 0.50–1.10)
GFR calc Af Amer: 83 mL/min — ABNORMAL LOW (ref >90)
GFR calc non Af Amer: 72 mL/min — ABNORMAL LOW (ref >90)

## 2012-08-29 LAB — CBC
Hemoglobin: 14.1 g/dL (ref 12.0–15.0)
RBC: 4.74 MIL/uL (ref 3.87–5.11)

## 2012-08-29 LAB — URINE MICROSCOPIC-ADD ON

## 2012-08-29 MED ORDER — ONDANSETRON HCL 4 MG/2ML IJ SOLN
4.0000 mg | Freq: Three times a day (TID) | INTRAMUSCULAR | Status: DC | PRN
  Administered 2012-08-29: 4 mg via INTRAVENOUS
  Filled 2012-08-29: qty 2

## 2012-08-29 MED ORDER — HYDROCHLOROTHIAZIDE 50 MG PO TABS
50.0000 mg | ORAL_TABLET | Freq: Every day | ORAL | Status: DC
  Administered 2012-08-30: 50 mg via ORAL
  Filled 2012-08-29: qty 1

## 2012-08-29 MED ORDER — EZETIMIBE 10 MG PO TABS
10.0000 mg | ORAL_TABLET | Freq: Every day | ORAL | Status: DC
  Administered 2012-08-30: 10 mg via ORAL
  Filled 2012-08-29 (×2): qty 1

## 2012-08-29 MED ORDER — ASPIRIN EC 81 MG PO TBEC
81.0000 mg | DELAYED_RELEASE_TABLET | Freq: Every day | ORAL | Status: DC
  Administered 2012-08-30: 81 mg via ORAL
  Filled 2012-08-29 (×2): qty 1

## 2012-08-29 MED ORDER — SODIUM CHLORIDE 0.9 % IV SOLN: INTRAVENOUS | Status: DC

## 2012-08-29 MED ORDER — SODIUM CHLORIDE 0.9 % IV SOLN
INTRAVENOUS | Status: DC
  Administered 2012-08-29: 1000 mL via INTRAVENOUS
  Administered 2012-08-30: 04:00:00 via INTRAVENOUS

## 2012-08-29 MED ORDER — LABETALOL HCL 5 MG/ML IV SOLN: 10.0000 mg | INTRAVENOUS | Status: DC | PRN

## 2012-08-29 MED ORDER — TRAMADOL HCL 50 MG PO TABS: 50.0000 mg | ORAL_TABLET | Freq: Three times a day (TID) | ORAL | Status: DC | PRN

## 2012-08-29 MED ORDER — ASPIRIN 81 MG PO CHEW
234.0000 mg | CHEWABLE_TABLET | Freq: Once | ORAL | Status: AC
  Administered 2012-08-29: 243 mg via ORAL

## 2012-08-29 MED ORDER — ONDANSETRON HCL 4 MG PO TABS: 4.0000 mg | ORAL_TABLET | Freq: Four times a day (QID) | ORAL | Status: DC | PRN

## 2012-08-29 MED ORDER — ATORVASTATIN CALCIUM 80 MG PO TABS
80.0000 mg | ORAL_TABLET | Freq: Every day | ORAL | Status: DC
  Administered 2012-08-30: 80 mg via ORAL
  Filled 2012-08-29 (×2): qty 1

## 2012-08-29 MED ORDER — METOPROLOL SUCCINATE ER 50 MG PO TB24
50.0000 mg | ORAL_TABLET | Freq: Every day | ORAL | Status: DC
  Administered 2012-08-30: 50 mg via ORAL
  Filled 2012-08-29 (×2): qty 1

## 2012-08-29 MED ORDER — ONDANSETRON HCL 4 MG/2ML IJ SOLN: 4.0000 mg | Freq: Four times a day (QID) | INTRAMUSCULAR | Status: DC | PRN

## 2012-08-29 MED ORDER — VENLAFAXINE HCL ER 75 MG PO CP24
75.0000 mg | ORAL_CAPSULE | Freq: Every day | ORAL | Status: DC
Start: 2012-08-29 — End: 2012-08-30
  Administered 2012-08-29: 75 mg via ORAL
  Filled 2012-08-29 (×2): qty 1

## 2012-08-29 MED ORDER — AMLODIPINE BESYLATE 10 MG PO TABS
10.0000 mg | ORAL_TABLET | Freq: Every day | ORAL | Status: DC
  Administered 2012-08-30: 10 mg via ORAL
  Filled 2012-08-29: qty 1

## 2012-08-29 MED ORDER — ENOXAPARIN SODIUM 40 MG/0.4ML ~~LOC~~ SOLN
40.0000 mg | SUBCUTANEOUS | Status: DC
  Administered 2012-08-29: 40 mg via SUBCUTANEOUS
  Filled 2012-08-29 (×2): qty 0.4

## 2012-08-29 MED ORDER — SODIUM CHLORIDE 0.9 % IJ SOLN
3.0000 mL | Freq: Two times a day (BID) | INTRAMUSCULAR | Status: DC
  Administered 2012-08-29: 3 mL via INTRAVENOUS

## 2012-08-29 MED ORDER — ACETAMINOPHEN 650 MG RE SUPP: 650.0000 mg | Freq: Four times a day (QID) | RECTAL | Status: DC | PRN

## 2012-08-29 MED ORDER — ASPIRIN 81 MG PO CHEW
324.0000 mg | CHEWABLE_TABLET | Freq: Once | ORAL | Status: DC
  Filled 2012-08-29: qty 4

## 2012-08-29 MED ORDER — ACETAMINOPHEN 325 MG PO TABS: 650.0000 mg | ORAL_TABLET | Freq: Four times a day (QID) | ORAL | Status: DC | PRN

## 2012-08-29 NOTE — ED Notes (Signed)
No old EKG.

## 2012-08-29 NOTE — ED Provider Notes (Signed)
History     CSN: [id number]  Arrival date & time 08/29/12  1123   First MD Initiated Contact with Patient 08/29/12 1134      Chief Complaint  Patient presents with  . Abdominal Pain    (Consider location/radiation/quality/duration/timing/severity/associated sxs/prior treatment) HPI Comments: Debbie Delacruz is a 67 y.o. Female presenting with abdominal pain,  Nausea and diarrhea.  She describes having about 6 episodes of non bloody diarrhea along with intermittent cramping abdominal yesterday,  Then at work this am,  She became nauseated,  Had cramping abdominal pain,  Felt like she needed to have a bm but could not,  Then became diaphoretic and weak,  Made it to a coworkers office, sat down,  Then collapsed on the floor, possibly with a brief loc.  She denies chest pain or shortness of breath,  Has had no palpitations and denies injury from her fall from the chair.  She currently feels chilled and slightly nauseated and weak and still having vague mid abdominal cramping.  She has taken no medicines prior to arrival including her normal morning meds which she usually takes at work mid morning.     The history is provided by the patient.    Past Medical History  Diagnosis Date  . Hypertension   . Coronary artery disease   . Myocardial infarct, old     Past Surgical History  Procedure Laterality Date  . Coronary stent placement      multiple  . Appendectomy    . Cholecystectomy    . Hernia repair    . Abdominal hysterectomy      No family history on file.  History  Substance Use Topics  . Smoking status: Not on file  . Smokeless tobacco: Not on file  . Alcohol Use: Not on file    OB History   Grav Para Term Preterm Abortions TAB SAB Ect Mult Living                  Review of Systems  Constitutional: Positive for chills and diaphoresis. Negative for fever.  HENT: Negative for congestion, sore throat and neck pain.   Eyes: Negative.   Respiratory: Negative  for cough, chest tightness and shortness of breath.   Cardiovascular: Negative for chest pain and palpitations.  Gastrointestinal: Positive for nausea, abdominal pain and diarrhea. Negative for vomiting.  Genitourinary: Negative.  Negative for dysuria.  Musculoskeletal: Negative for joint swelling and arthralgias.  Skin: Negative.  Negative for rash and wound.  Neurological: Positive for weakness. Negative for dizziness, light-headedness, numbness and headaches.  Psychiatric/Behavioral: Negative.     Allergies  Review of patient's allergies indicates no known allergies.  Home Medications   Current Outpatient Rx  Name  Route  Sig  Dispense  Refill  . amLODipine (NORVASC) 10 MG tablet   Oral   Take 10 mg by mouth daily.         Marland Kitchen aspirin EC 81 MG tablet   Oral   Take 81 mg by mouth daily.         Marland Kitchen ezetimibe (ZETIA) 10 MG tablet   Oral   Take 10 mg by mouth daily.         . hydrochlorothiazide (HYDRODIURIL) 50 MG tablet   Oral   Take 50 mg by mouth daily.         . metoprolol succinate (TOPROL-XL) 50 MG 24 hr tablet   Oral   Take 50 mg by mouth daily. Take with  or immediately following a meal.         . rosuvastatin (CRESTOR) 40 MG tablet   Oral   Take 40 mg by mouth daily.         . traMADol (ULTRAM) 50 MG tablet   Oral   Take 50 mg by mouth every 8 (eight) hours as needed for pain.         Marland Kitchen venlafaxine XR (EFFEXOR-XR) 75 MG 24 hr capsule   Oral   Take 75 mg by mouth at bedtime.           BP 150/70  Pulse 84  Temp(Src) 98.5 F (36.9 C)  Resp 18  Ht 5' 2.5" (1.588 m)  Wt 190 lb (86.183 kg)  BMI 34.18 kg/m2  SpO2 97%  Physical Exam  Nursing note and vitals reviewed. Constitutional: She is oriented to person, place, and time. She appears well-developed and well-nourished.  HENT:  Head: Normocephalic and atraumatic.  Eyes: Conjunctivae are normal.  Neck: Normal range of motion.  Cardiovascular: Normal rate, regular rhythm, normal heart  sounds and intact distal pulses.   Pulmonary/Chest: Effort normal and breath sounds normal. She has no wheezes.  Abdominal: Soft. Bowel sounds are normal. She exhibits no distension. There is tenderness. There is no guarding.  Musculoskeletal: Normal range of motion.  Neurological: She is alert and oriented to person, place, and time.  Skin: Skin is warm and dry.  Psychiatric: She has a normal mood and affect.    ED Course  Procedures (including critical care time)  Labs Reviewed  COMPREHENSIVE METABOLIC PANEL - Abnormal; Notable for the following:    Glucose, Bld 121 (*)    GFR calc non Af Amer 88 (*)    All other components within normal limits  URINALYSIS, ROUTINE W REFLEX MICROSCOPIC - Abnormal; Notable for the following:    APPearance CLOUDY (*)    Leukocytes, UA TRACE (*)    All other components within normal limits  URINE MICROSCOPIC-ADD ON - Abnormal; Notable for the following:    Squamous Epithelial / LPF MANY (*)    Bacteria, UA MANY (*)    All other components within normal limits  POCT I-STAT TROPONIN I - Abnormal; Notable for the following:    Troponin i, poc 0.10 (*)    All other components within normal limits  URINE CULTURE  CBC WITH DIFFERENTIAL   Dg Chest 2 View  08/29/2012  *RADIOLOGY REPORT*  Clinical Data: Abdominal pain this morning.  Diarrhea began yesterday.  Syncopal episode at work.  History of hypertension.  CHEST - 2 VIEW  Comparison: 10/10/2007  Findings: Heart size is normal.  There are no focal consolidations or pleural effusions.  No pulmonary edema.  Mild degenerate changes are seen in the spine.  Surgical clips are present in the right upper quadrant of the abdomen.  IMPRESSION: Negative exam.   Original Report Authenticated By: Norva Pavlov, M.D.      1. Syncope   2. Diarrhea     Patient was giving her home morning BP medicines including metoprolol, amlodipine and HCTZ.  She was also given aspirin 324 mg by mouth.  Preliminary lab resulting  with a i-STAT troponin elevated at 0.10.  Other labs pending at this time.  EKG without acute changes.  MDM  Discussed patient's care with Dr. Adriana Simas who agrees with admission for troponin cycling and further evaluation of GI symptoms.  It is unclear whether her GI symptoms could be an atypical cardiac presentation.  Patient denies chest pain and shortness of breath still, but with syncopal event and prior history of MI with stent placement, further evaluation warranted.    Date: 08/29/2012  Rate: 94  Rhythm: normal sinus rhythm  QRS Axis: normal  Intervals: QT prolonged  ST/T Wave abnormalities: nonspecific ST changes  Conduction Disutrbances:none  Narrative Interpretation:   Old EKG Reviewed: none available     Discussed with Dr. Kirt Boys with [facility].  Will admit,  Temp admission orders given.     Burgess Amor, PA-C 08/29/12 1356  Burgess Amor, PA-C 08/29/12 1356

## 2012-08-29 NOTE — ED Notes (Signed)
Pt reports while at work she had ABD pain and nausea. Pt felt weak and  Sat on the floor but did not pass out. EMS reported Pt 's hair was wet from the reported earlier diaphoretic episode. EMS reported Pt has a Hx of multiple cardiac stents and a HX of HTN. Pt reports she did not take her AM Meds.

## 2012-08-29 NOTE — ED Provider Notes (Signed)
Medical screening examination/treatment/procedure(s) were conducted as a shared visit with non-physician practitioner(s) and myself.  I personally evaluated the patient during the encounter.  Unexplained syncope.  admit  Donnetta Hutching, MD 08/29/12 1409

## 2012-08-29 NOTE — ED Notes (Signed)
Patient transported to X-ray 

## 2012-08-29 NOTE — ED Notes (Signed)
Family at bedside. 

## 2012-08-29 NOTE — ED Notes (Signed)
PA at bedside.

## 2012-08-29 NOTE — H&P (Signed)
[facility] History and Physical  Debbie Delacruz NWG:[id number] DOB: [birth year] DOA: 08/29/2012  Referring physician:  PCP: Doristine Mango, PA-C  Specialists:   Chief Complaint: Syncope  HPI: Debbie Delacruz is a 67 y.o. female history of hypertension, coronary artery disease /MI in the past and status post stents (about 15 years ago) who presents with above complaints. She states that yesterday she had diarrhea x5, non-bloody, and today only one episode of diarrhea so far. She went to the office this morning and felt nauseous so she went to the bathroom but did not have a bowel movement and also did not vomit. On her way back to the office she just felt very nauseous and so stopped co-worker's office and sat down-the next thing she knew she had passed out and was lying on the floor. She states that when she came to it her coworker told her she had a passed out briefly-seconds. She was diaphoretic/clammy. No jerking or seizure-like activity, no incontinence reported. She denies dizziness, chest pain, fevers, dysuria, melena and no hematochezia. She was seen in the ED and a point-of-care troponin was elevated at 0.1 but the lab troponin came back normal at <0.30. A chest x-ray was done and showed no active cardiopulmonary disease. In the ED her blood pressure was markedly elevated at 192/80. She is admitted for further evaluation and management.   Review of Systems: The patient denies anorexia, fever, weight loss, vision loss, decreased hearing, hoarseness, chest pain, dyspnea on exertion, peripheral edema, balance deficits, hemoptysis, abdominal pain, melena, hematochezia, severe indigestion/heartburn, hematuria, incontinence,  transient blindness, difficulty walking, depression, unusual weight change, abnormal bleeding, enlarged lymph nodes, angioedema, and breast masses.    Past Medical History  Diagnosis Date  . Hypertension   . Coronary artery disease   . Myocardial infarct,  old    she admits to borderline diabetes-on no meds or diet. Past Surgical History  Procedure Laterality Date  . Coronary stent placement      multiple  . Appendectomy    . Cholecystectomy    . Hernia repair    . Abdominal hysterectomy     Social History:  has no tobacco, alcohol, and drug history on file.  where does patient live--home Can patient participate in ADLs-yes  No Known Allergies  Family history-her brother had a stroke  Prior to Admission medications   Medication Sig Start Date End Date Taking? Authorizing Provider  amLODipine (NORVASC) 10 MG tablet Take 10 mg by mouth daily.   Yes Historical Provider, MD  aspirin EC 81 MG tablet Take 81 mg by mouth daily.   Yes Historical Provider, MD  ezetimibe (ZETIA) 10 MG tablet Take 10 mg by mouth daily.   Yes Historical Provider, MD  hydrochlorothiazide (HYDRODIURIL) 50 MG tablet Take 50 mg by mouth daily.   Yes Historical Provider, MD  metoprolol succinate (TOPROL-XL) 50 MG 24 hr tablet Take 50 mg by mouth daily. Take with or immediately following a meal.   Yes Historical Provider, MD  rosuvastatin (CRESTOR) 40 MG tablet Take 40 mg by mouth daily.   Yes Historical Provider, MD  traMADol (ULTRAM) 50 MG tablet Take 50 mg by mouth every 8 (eight) hours as needed for pain.   Yes Historical Provider, MD  venlafaxine XR (EFFEXOR-XR) 75 MG 24 hr capsule Take 75 mg by mouth at bedtime.   Yes Historical Provider, MD   Physical Exam: Filed Vitals:   08/29/12 1438 08/29/12 1445 08/29/12 1553 08/29/12 1600  BP: 159/74  150/71 166/84 161/85  Pulse: 80 81 85 89  Temp:   98.9 F (37.2 C)   TempSrc:   Oral   Resp: 21 15 18 20   Height:   5' 2.5" (1.588 m)   Weight:   90.3 kg (199 lb 1.2 oz)   SpO2: 98% 97% 97% 98%    Constitutional: Vital signs reviewed.  Patient is a well-developed and well-nourished  in no acute distress and cooperative with exam. Alert and oriented x3.  Head: Normocephalic and atraumatic Ear: TM normal  bilaterally Mouth: no erythema or exudates, slightly dry MM Eyes: PERRL, EOMI, conjunctivae normal, No scleral icterus.  Neck: Supple, Trachea midline normal ROM, No JVD, mass, thyromegaly, or carotid bruit present.  Cardiovascular: RRR, S1 normal, S2 normal, no MRG, pulses symmetric and intact bilaterally Pulmonary/Chest: CTAB, no wheezes, rales, or rhonchi Abdominal: Soft. Non-tender, non-distended, bowel sounds are normal, no masses, organomegaly, or guarding present.  GU: no CVA tenderness Extremities: no cyanosis and no edema  Neurological: A&O x3, Strength is normal and symmetric bilaterally, cranial nerve II-XII are grossly intact, no focal motor deficit, sensory intact to light touch bilaterally.  Skin: Warm, dry and intact. No rash, cyanosis, or clubbing.  Psychiatric: Normal mood and affect. speech and behavior is normal.   Labs on Admission:  Basic Metabolic Panel:  Recent Labs Lab 08/29/12 1139  NA 137  K 3.9  CL 102  CO2 25  GLUCOSE 121*  BUN 23  CREATININE 0.73  CALCIUM 8.9   Liver Function Tests:  Recent Labs Lab 08/29/12 1139  AST 30  ALT 28  ALKPHOS 74  BILITOT 0.3  PROT 7.1  ALBUMIN 3.7   No results found for this basename: LIPASE, AMYLASE,  in the last 168 hours No results found for this basename: AMMONIA,  in the last 168 hours CBC:  Recent Labs Lab 08/29/12 1139  WBC 7.2  NEUTROABS 4.8  HGB 13.7  HCT 40.5  MCV 88.6  PLT 213   Cardiac Enzymes:  Recent Labs Lab 08/29/12 1433  TROPONINI <0.30    BNP (last 3 results) No results found for this basename: PROBNP,  in the last 8760 hours CBG: No results found for this basename: GLUCAP,  in the last 168 hours  Radiological Exams on Admission: Dg Chest 2 View  08/29/2012  *RADIOLOGY REPORT*  Clinical Data: Abdominal pain this morning.  Diarrhea began yesterday.  Syncopal episode at work.  History of hypertension.  CHEST - 2 VIEW  Comparison: 10/10/2007  Findings: Heart size is normal.   There are no focal consolidations or pleural effusions.  No pulmonary edema.  Mild degenerate changes are seen in the spine.  Surgical clips are present in the right upper quadrant of the abdomen.  IMPRESSION: Negative exam.   Original Report Authenticated By: Norva Pavlov, M.D.     EKG: Independently reviewed. Sinus rhythm at 94 with no acute ischemic changes the  Assessment/Plan Active Problems:   Syncope -Orthostatic versus vasovagal -She has no localizing neurologic findings -We'll obtain orthostatic vitals, cycle cardiac enzymes also obtained 2-D echocardiogram and follow.  -Elevated point-of-care troponin x1-follow and consult cardiology pending further enzymes  Uncontrolled HYPERTENSION -Improved after she was given her outpatient meds in ED, monitor and treat further with when necessary labetalol as appropriate   Diarrhea -Obtain stool studies, supportive care and follow   HYPERLIPIDEMIA -Continue statin   CAD -she is chest pain free, cycle cardiac enzymes as above -Continue outpatient medications the History of 'borderline' diabetes -  Obtain hemoglobin A1c and follow   Code Status: full Family Communication: Family at bedside  Disposition Plan: Admit to step down  Time spent:   Kela Millin [facility] Pager [phone]  If 7PM-7AM, please contact night-coverage [email or web address] Password [email or web address] 08/29/2012, 5:20 PM

## 2012-08-30 DIAGNOSIS — R197 Diarrhea, unspecified: Secondary | ICD-10-CM

## 2012-08-30 LAB — TROPONIN I: Troponin I: <0.3 ng/mL (ref <0.30)

## 2012-08-30 LAB — BASIC METABOLIC PANEL
BUN: 23 mg/dL (ref 6–23)
Calcium: 8.6 mg/dL (ref 8.4–10.5)
Chloride: 101 mEq/L (ref 96–112)
Creatinine, Ser: 0.93 mg/dL (ref 0.50–1.10)
GFR calc Af Amer: 74 mL/min — ABNORMAL LOW (ref >90)
GFR calc non Af Amer: 64 mL/min — ABNORMAL LOW (ref >90)

## 2012-08-30 NOTE — Progress Notes (Signed)
  Echocardiogram 2D Echocardiogram has been performed.  Ellender Hose A 08/30/2012, 12:06 PM

## 2012-08-30 NOTE — Discharge Summary (Signed)
Physician Discharge Summary  Debbie Delacruz NWG:[id number] DOB: 09-02-1945 DOA: 08/29/2012  PCP: Doristine Mango, PA-C  Admit date: 08/29/2012 Discharge date: 09/06/2012  Time spent: > 45 minutes   Discharge Diagnoses:  Principal Problem:   Syncope Active Problems:   HYPERLIPIDEMIA   HYPERTENSION   CAD   Diarrhea   Discharge Condition: stable  Diet recommendation: heart healthy  Filed Weights   08/29/12 1133 08/29/12 1553  Weight: 86.183 kg (190 lb) 90.3 kg (199 lb 1.2 oz)    History of present illness:  Debbie Delacruz is a 67 y.o. female history of hypertension, coronary artery disease /MI in the past and status post stents (about 15 years ago) who presents with above complaints. She states that yesterday she had diarrhea x5, non-bloody, and today only one episode of diarrhea so far. She went to the office this morning and felt nauseous so she went to the bathroom but did not have a bowel movement and also did not vomit. On her way back to the office she just felt very nauseous and so stopped co-worker's office and sat down-the next thing she knew she had passed out and was lying on the floor. She states that when she came to it her coworker told her she had a passed out briefly-seconds. She was diaphoretic/clammy. No jerking or seizure-like activity, no incontinence reported. She denies dizziness, chest pain, fevers, dysuria, melena and no hematochezia. She was seen in the ED and a point-of-care troponin was elevated at 0.1 but the lab troponin came back normal at <0.30. A chest x-ray was done and showed no active cardiopulmonary disease. In the ED her blood pressure was markedly elevated at 192/80. She is admitted for further evaluation and management.   Hospital Course:  Syncope  -Possibly vasovagal  -She has no localizing neurologic findings  - orthostatic vitals were negative -4 sets of troponins were negative after an initial troponin of 0.10 -A 2-D echo has been  completed-results reveal only grade 1 diastolic dysfuntion -Since the patient never had any chest pain no further cardiac workup has been ordered  Uncontrolled HYPERTENSION  -Improved after she was given her outpatient meds in ED  Diarrhea  -resolved  HYPERLIPIDEMIA  -Continue statin   CAD  -she is chest pain free, cycled cardiac enzymes- which were negative -Continue outpatient medications   History of 'borderline' diabetes  -A1c 5.8  NOTE: UA reveals trace leukocytes and many bacteria however, pt is asymptomatic and therefore does not require treatment for this  Discharge Exam: Filed Vitals:   08/30/12 0935 08/30/12 0937 08/30/12 1219 08/30/12 1605  BP: 164/89 161/96 143/91 127/68  Pulse: 81 86 73 78  Temp:   98.1 F (36.7 C) 98.8 F (37.1 C)  TempSrc:   Oral Oral  Resp: 15 18 18 22   Height:      Weight:      SpO2: 96% 96% 95% 95%    General: AAO X3, no distress Cardiovascular: RRR, no murmurs Respiratory: CTA b/l  Discharge Instructions      Discharge Orders   Future Appointments Provider Department Dept Phone   11/16/2012 8:30 AM Wynona Dove, MD [facility] Rough [phone]   Future Orders Complete By Expires     Diet - low sodium heart healthy  As directed     Increase activity slowly  As directed         Medication List    STOP taking these medications       rosuvastatin  40 MG tablet  Commonly known as:  CRESTOR      TAKE these medications       amLODipine 10 MG tablet  Commonly known as:  NORVASC  Take 10 mg by mouth daily.     aspirin EC 81 MG tablet  Take 81 mg by mouth daily.     ezetimibe 10 MG tablet  Commonly known as:  ZETIA  Take 10 mg by mouth daily.     hydrochlorothiazide 50 MG tablet  Commonly known as:  HYDRODIURIL  Take 50 mg by mouth daily.     metoprolol succinate 50 MG 24 hr tablet  Commonly known as:  TOPROL-XL  Take 50 mg by mouth daily. Take with or immediately following a meal.      traMADol 50 MG tablet  Commonly known as:  ULTRAM  Take 50 mg by mouth every 8 (eight) hours as needed for pain.     venlafaxine XR 75 MG 24 hr capsule  Commonly known as:  EFFEXOR-XR  Take 75 mg by mouth at bedtime.          The results of significant diagnostics from this hospitalization (including imaging, microbiology, ancillary and laboratory) are listed below for reference.    Significant Diagnostic Studies: Dg Chest 2 View  08/29/2012  *RADIOLOGY REPORT*  Clinical Data: Abdominal pain this morning.  Diarrhea began yesterday.  Syncopal episode at work.  History of hypertension.  CHEST - 2 VIEW  Comparison: 10/10/2007  Findings: Heart size is normal.  There are no focal consolidations or pleural effusions.  No pulmonary edema.  Mild degenerate changes are seen in the spine.  Surgical clips are present in the right upper quadrant of the abdomen.  IMPRESSION: Negative exam.   Original Report Authenticated By: Norva Pavlov, M.D.     Microbiology:    Labs: Basic Metabolic Panel: No results found for this basename: NA, K, CL, CO2, GLUCOSE, BUN, CREATININE, CALCIUM, MG, PHOS,  in the last 168 hours Liver Function Tests: No results found for this basename: AST, ALT, ALKPHOS, BILITOT, PROT, ALBUMIN,  in the last 168 hours No results found for this basename: LIPASE, AMYLASE,  in the last 168 hours No results found for this basename: AMMONIA,  in the last 168 hours CBC: No results found for this basename: WBC, NEUTROABS, HGB, HCT, MCV, PLT,  in the last 168 hours Cardiac Enzymes: No results found for this basename: CKTOTAL, CKMB, CKMBINDEX, TROPONINI,  in the last 168 hours BNP: BNP (last 3 results) No results found for this basename: PROBNP,  in the last 8760 hours CBG: No results found for this basename: GLUCAP,  in the last 168 hours     Signed:  Lumir Demetriou  [facility] 09/06/2012, 9:06 PM

## 2012-08-31 LAB — URINE CULTURE: Colony Count: >=100000

## 2012-11-16 ENCOUNTER — Encounter: Payer: Self-pay | Admitting: Gastroenterology

## 2012-11-16 ENCOUNTER — Ambulatory Visit (INDEPENDENT_AMBULATORY_CARE_PROVIDER_SITE_OTHER): Payer: Medicare Other | Admitting: Internal Medicine

## 2012-11-16 ENCOUNTER — Encounter: Payer: Self-pay | Admitting: Internal Medicine

## 2012-11-16 VITALS — BP 178/100 | HR 86 | Temp 98.2°F | Ht 62.0 in | Wt 205.0 lb

## 2012-11-16 DIAGNOSIS — R739 Hyperglycemia, unspecified: Secondary | ICD-10-CM

## 2012-11-16 DIAGNOSIS — R7309 Other abnormal glucose: Secondary | ICD-10-CM

## 2012-11-16 DIAGNOSIS — Z1211 Encounter for screening for malignant neoplasm of colon: Secondary | ICD-10-CM

## 2012-11-16 DIAGNOSIS — Z1239 Encounter for other screening for malignant neoplasm of breast: Secondary | ICD-10-CM

## 2012-11-16 DIAGNOSIS — D509 Iron deficiency anemia, unspecified: Secondary | ICD-10-CM | POA: Insufficient documentation

## 2012-11-16 DIAGNOSIS — D649 Anemia, unspecified: Secondary | ICD-10-CM

## 2012-11-16 DIAGNOSIS — I10 Essential (primary) hypertension: Secondary | ICD-10-CM

## 2012-11-16 DIAGNOSIS — M2550 Pain in unspecified joint: Secondary | ICD-10-CM | POA: Insufficient documentation

## 2012-11-16 DIAGNOSIS — E785 Hyperlipidemia, unspecified: Secondary | ICD-10-CM

## 2012-11-16 DIAGNOSIS — F411 Generalized anxiety disorder: Secondary | ICD-10-CM

## 2012-11-16 DIAGNOSIS — I2510 Atherosclerotic heart disease of native coronary artery without angina pectoris: Secondary | ICD-10-CM

## 2012-11-16 LAB — CBC WITH DIFFERENTIAL/PLATELET
Basophils Relative: 0.6 % (ref 0.0–3.0)
Eosinophils Relative: 5 % (ref 0.0–5.0)
HCT: 41.4 % (ref 36.0–46.0)
Lymphs Abs: 1.6 10*3/uL (ref 0.7–4.0)
MCV: 89 fl (ref 78.0–100.0)
Monocytes Absolute: 0.7 10*3/uL (ref 0.1–1.0)
Platelets: 254 10*3/uL (ref 150.0–400.0)
RBC: 4.65 Mil/uL (ref 3.87–5.11)
WBC: 7.5 10*3/uL (ref 4.5–10.5)

## 2012-11-16 LAB — LIPID PANEL
Cholesterol: 225 mg/dL — ABNORMAL HIGH (ref 0–200)
Total CHOL/HDL Ratio: 4
Triglycerides: 141 mg/dL (ref 0.0–149.0)

## 2012-11-16 LAB — COMPREHENSIVE METABOLIC PANEL
Albumin: 4.2 g/dL (ref 3.5–5.2)
Alkaline Phosphatase: 64 U/L (ref 39–117)
BUN: 23 mg/dL (ref 6–23)
Glucose, Bld: 116 mg/dL — ABNORMAL HIGH (ref 70–99)
Potassium: 4.9 mEq/L (ref 3.5–5.1)

## 2012-11-16 MED ORDER — HYDROCHLOROTHIAZIDE 50 MG PO TABS: 50.0000 mg | ORAL_TABLET | Freq: Every day | ORAL | Status: DC

## 2012-11-16 MED ORDER — FLUOXETINE HCL 20 MG PO TABS: 20.0000 mg | ORAL_TABLET | Freq: Every day | ORAL | Status: DC

## 2012-11-16 MED ORDER — METOPROLOL SUCCINATE ER 50 MG PO TB24: 50.0000 mg | ORAL_TABLET | Freq: Every day | ORAL | Status: DC

## 2012-11-16 MED ORDER — AMLODIPINE BESYLATE 10 MG PO TABS: 10.0000 mg | ORAL_TABLET | Freq: Every day | ORAL | Status: DC

## 2012-11-16 MED ORDER — TRAMADOL HCL 50 MG PO TABS: 50.0000 mg | ORAL_TABLET | Freq: Three times a day (TID) | ORAL | Status: DC | PRN

## 2012-11-16 NOTE — Assessment & Plan Note (Signed)
History of anemia in the past. CBC normal today.

## 2012-11-16 NOTE — Assessment & Plan Note (Signed)
History of coronary artery disease status post stent placement. Will request records on cardiac catheterization and stent placement. Starting Crestor today. Continue aspirin.

## 2012-11-16 NOTE — Assessment & Plan Note (Signed)
History of borderline elevated blood sugars. A1c today 6.4%. Encouraged healthy diet and regular physical activity. She is following up in 4 weeks. Will discuss nutrition education referral at that visit.

## 2012-11-16 NOTE — Assessment & Plan Note (Signed)
Lipids are elevated today with LDL 164. Known h/o CAD. Given myalgia with atorvastatin in the past, will try Crestor. Plan to repeat CMP and lipids in 1 month.

## 2012-11-16 NOTE — Assessment & Plan Note (Signed)
Significant anxiety with recent job stressors and caring for her nephew. Offered support today. Will start Fluoxetine as this has worked well for her in the past. Follow up 4 weeks and prn.

## 2012-11-16 NOTE — Assessment & Plan Note (Signed)
BP Readings from Last 3 Encounters:  11/16/12 178/100  08/30/12 127/68  09/17/08 150/100   BP elevated today, however she has been off meds for several days. Will restart BP meds and have her monitor BP at home. She will call if persistently >140/90. Follow up 4 weeks and prn.

## 2012-11-16 NOTE — Assessment & Plan Note (Signed)
Symptoms of diffuse arthralgia well controlled with tramadol as needed. Will continue.

## 2012-11-16 NOTE — Progress Notes (Signed)
Subjective:    Patient ID: Debbie Delacruz, female    DOB: [birth year], 67 y.o.   MRN: [id number]  HPI 67 year old female with history of hypertension, coronary artery disease s/p stent placement and anxiety/depression presents to establish care. She reports that approximately 10 years ago she had an episode during which he became diaphoretic at work. She went home, thinking that she had the flu. Ultimately, her husband brought her to the ER where she was found to have had a heart attack. She underwent cardiac catheterization and had 2 stents placed. She is unsure which vessels these were placed in. She was on Plavix for 3 months after this procedure. She has not recently followed with a cardiologist. She denies any recent episodes of chest pain, palpitations, shortness of breath. She has been generally compliant with her medication but has been out of blood pressure medication for several days. In the past, she was tried on Lipitor but had some muscle pain with this so stopped the medication.  She reports some worsening anxiety and depressed mood recently with work stressors and with caring for her 45 year old nephew. Her previous doctor had put her on Effexor for this however she did not feel that the medication was helpful. She reports that Prozac worked very well for her in the past.  Outpatient Encounter Prescriptions as of 11/16/2012  Medication Sig Dispense Refill  . amLODipine (NORVASC) 10 MG tablet Take 1 tablet (10 mg total) by mouth daily.  90 tablet  4  . aspirin EC 81 MG tablet Take 81 mg by mouth daily.      . hydrochlorothiazide (HYDRODIURIL) 50 MG tablet Take 1 tablet (50 mg total) by mouth daily.  90 tablet  4  . metoprolol succinate (TOPROL-XL) 50 MG 24 hr tablet Take 1 tablet (50 mg total) by mouth daily. Take with or immediately following a meal.  90 tablet  4  . traMADol (ULTRAM) 50 MG tablet Take 1 tablet (50 mg total) by mouth every 8 (eight) hours as needed for pain.  90  tablet  6   No facility-administered encounter medications on file as of 11/16/2012.   BP 178/100  Pulse 86  Temp(Src) 98.2 F (36.8 C) (Oral)  Ht 5\' 2"  (1.575 m)  Wt 205 lb (92.987 kg)  BMI 37.49 kg/m2  SpO2 95%  Review of Systems  Constitutional: Negative for fever, chills, appetite change, fatigue and unexpected weight change.  HENT: Negative for ear pain, congestion, sore throat, trouble swallowing, neck pain, voice change and sinus pressure.   Eyes: Negative for visual disturbance.  Respiratory: Negative for cough, shortness of breath, wheezing and stridor.   Cardiovascular: Negative for chest pain, palpitations and leg swelling.  Gastrointestinal: Negative for nausea, vomiting, abdominal pain, diarrhea, constipation, blood in stool, abdominal distention and anal bleeding.  Genitourinary: Negative for dysuria and flank pain.  Musculoskeletal: Negative for myalgias, arthralgias and gait problem.  Skin: Negative for color change and rash.  Neurological: Negative for dizziness and headaches.  Hematological: Negative for adenopathy. Does not bruise/bleed easily.  Psychiatric/Behavioral: Positive for dysphoric mood. Negative for suicidal ideas and sleep disturbance. The patient is nervous/anxious.        Objective:   Physical Exam  Constitutional: She is oriented to person, place, and time. She appears well-developed and well-nourished. No distress.  HENT:  Head: Normocephalic and atraumatic.  Right Ear: External ear normal.  Left Ear: External ear normal.  Nose: Nose normal.  Mouth/Throat: Oropharynx is clear and moist. No oropharyngeal  exudate.  Eyes: Conjunctivae are normal. Pupils are equal, round, and reactive to light. Right eye exhibits no discharge. Left eye exhibits no discharge. No scleral icterus.  Neck: Normal range of motion. Neck supple. No tracheal deviation present. No thyromegaly present.  Cardiovascular: Normal rate, regular rhythm, normal heart sounds and  intact distal pulses.  Exam reveals no gallop and no friction rub.   No murmur heard. Pulmonary/Chest: Effort normal and breath sounds normal. No accessory muscle usage. Not tachypneic. No respiratory distress. She has no decreased breath sounds. She has no wheezes. She has no rhonchi. She has no rales. She exhibits no tenderness.  Abdominal: Soft. Bowel sounds are normal. She exhibits no distension and no mass. There is no tenderness. There is no rebound and no guarding.  Musculoskeletal: Normal range of motion. She exhibits no edema and no tenderness.  Lymphadenopathy:    She has no cervical adenopathy.  Neurological: She is alert and oriented to person, place, and time. No cranial nerve deficit. She exhibits normal muscle tone. Coordination normal.  Skin: Skin is warm and dry. No rash noted. She is not diaphoretic. No erythema. No pallor.  Psychiatric: She has a normal mood and affect. Her behavior is normal. Judgment and thought content normal.          Assessment & Plan:

## 2012-11-17 ENCOUNTER — Encounter: Payer: Self-pay | Admitting: Internal Medicine

## 2012-11-21 ENCOUNTER — Encounter: Payer: Self-pay | Admitting: Internal Medicine

## 2012-11-21 ENCOUNTER — Ambulatory Visit: Payer: Medicare Other | Admitting: Cardiovascular Disease

## 2012-11-22 ENCOUNTER — Encounter: Payer: Self-pay | Admitting: Internal Medicine

## 2012-12-02 ENCOUNTER — Ambulatory Visit: Payer: Medicare Other | Admitting: Cardiovascular Disease

## 2012-12-12 ENCOUNTER — Encounter: Payer: Self-pay | Admitting: Internal Medicine

## 2012-12-12 DIAGNOSIS — F411 Generalized anxiety disorder: Secondary | ICD-10-CM

## 2012-12-13 ENCOUNTER — Encounter: Payer: Self-pay | Admitting: *Deleted

## 2012-12-13 ENCOUNTER — Telehealth: Payer: Self-pay | Admitting: *Deleted

## 2012-12-13 MED ORDER — FLUOXETINE HCL 20 MG PO TABS: 40.0000 mg | ORAL_TABLET | Freq: Every day | ORAL | Status: DC

## 2012-12-13 NOTE — Telephone Encounter (Signed)
Megan from [facility] pharmacy called, she stated there is an interaction between the Fluoxetine and Tramadol the patient is prescribed. Please advise

## 2012-12-13 NOTE — Telephone Encounter (Signed)
Informed patient to stop the Tramadol and she verbalized understanding. Also informed Aundra Millet the patient has been informed to d/c the Tramadol.

## 2012-12-13 NOTE — Telephone Encounter (Signed)
Please let pt know that she will need to discontinue Tramadol.

## 2012-12-15 ENCOUNTER — Ambulatory Visit
Admission: RE | Admit: 2012-12-15 | Discharge: 2012-12-15 | Disposition: A | Discharge status: Home / Self Care | Payer: Medicare Other | Source: Ambulatory Visit | Attending: Internal Medicine | Admitting: Internal Medicine

## 2012-12-15 DIAGNOSIS — Z1239 Encounter for other screening for malignant neoplasm of breast: Secondary | ICD-10-CM

## 2012-12-19 ENCOUNTER — Encounter: Payer: Self-pay | Admitting: Cardiovascular Disease

## 2012-12-22 ENCOUNTER — Ambulatory Visit: Payer: Medicare Other | Admitting: Cardiovascular Disease

## 2012-12-28 ENCOUNTER — Other Ambulatory Visit: Payer: Self-pay

## 2012-12-29 ENCOUNTER — Encounter: Payer: Self-pay | Admitting: Internal Medicine

## 2013-01-03 ENCOUNTER — Encounter: Payer: Self-pay | Admitting: Internal Medicine

## 2013-01-03 ENCOUNTER — Telehealth: Payer: Self-pay

## 2013-01-03 ENCOUNTER — Encounter: Payer: Self-pay | Admitting: Cardiovascular Disease

## 2013-01-03 NOTE — Telephone Encounter (Signed)
Dr. Windell Norfolk Debbie Delacruz is coming into the office for her pre-visit in the am prior to her colonoscopy with you on 01/18/13.Last colonoscopy done in 2008 the Debbie Delacruz had fair results with the Moviprep. She can not take Prepopik due to hypertension.Please advise  Thanks

## 2013-01-04 ENCOUNTER — Encounter: Payer: Self-pay | Admitting: Internal Medicine

## 2013-01-04 ENCOUNTER — Ambulatory Visit (AMBULATORY_SURGERY_CENTER): Payer: Medicare Other | Admitting: *Deleted

## 2013-01-04 ENCOUNTER — Encounter: Payer: Self-pay | Admitting: Cardiovascular Disease

## 2013-01-04 VITALS — Ht 64.0 in | Wt 211.4 lb

## 2013-01-04 DIAGNOSIS — Z86010 Personal history of colonic polyps: Secondary | ICD-10-CM

## 2013-01-04 MED ORDER — SOD PICOSULFATE-MAG OX-CIT ACD 10-3.5-12 MG-GM-GM PO PACK: 1.0000 | PACK | Freq: Once | ORAL | Status: DC

## 2013-01-04 NOTE — Progress Notes (Signed)
No egg or soy allergy  Pt has no renal hx, so prepopik given  She changed her appt to 01-13-13 at 8:30.  New times given with her instructions and understanding voiced

## 2013-01-04 NOTE — Telephone Encounter (Signed)
Noted  

## 2013-01-04 NOTE — Telephone Encounter (Signed)
The only contraindication to Prepopik is renal failure with pts on dialysis or very near dialysis.  No problem with HTN.

## 2013-01-09 ENCOUNTER — Encounter: Payer: Medicare Other | Admitting: Internal Medicine

## 2013-01-10 ENCOUNTER — Ambulatory Visit: Payer: Medicare Other | Admitting: Cardiovascular Disease

## 2013-01-13 ENCOUNTER — Ambulatory Visit (AMBULATORY_SURGERY_CENTER): Payer: Medicare Other | Admitting: Gastroenterology

## 2013-01-13 ENCOUNTER — Encounter: Payer: Self-pay | Admitting: Gastroenterology

## 2013-01-13 VITALS — BP 160/87 | HR 71 | Temp 99.1°F | Resp 16 | Ht 64.0 in | Wt 211.0 lb

## 2013-01-13 DIAGNOSIS — Z86010 Personal history of colonic polyps: Secondary | ICD-10-CM

## 2013-01-13 MED ORDER — SODIUM CHLORIDE 0.9 % IV SOLN: 500.0000 mL | INTRAVENOUS | Status: DC

## 2013-01-13 NOTE — Op Note (Signed)
[facility] [address] [place], [address]   COLONOSCOPY PROCEDURE REPORT  PATIENT: Debbie Delacruz, Debbie Delacruz  MR#: [id number] BIRTHDATE: 1945-12-02 , 67  yrs. old GENDER: Female ENDOSCOPIST: Meryl Dare, MD, [facility] PROCEDURE DATE:  01/13/2013 PROCEDURE:   Colonoscopy, screening First Screening Colonoscopy - Avg.  risk and is 50 yrs.  old or older - No.  Prior Negative Screening - Now for repeat screening. N/A  History of Adenoma - Now for follow-up colonoscopy & has been > or = to 3 yrs.  Yes hx of adenoma.  Has been 3 or more years since last colonoscopy.  Polyps Removed Today? No.  Recommend repeat exam, <10 yrs? Yes.  High risk (family or personal hx). ASA CLASS:   Class II INDICATIONS:Patient's personal history of adenomatous colon polyps.  MEDICATIONS: MAC sedation, administered by CRNA and propofol (Diprivan) 250mg  IV DESCRIPTION OF PROCEDURE:   After the risks benefits and alternatives of the procedure were thoroughly explained, informed consent was obtained.  A digital rectal exam revealed no abnormalities of the rectum.   The LB [id number] and [id number]  endoscope was introduced through the anus and advanced to the cecum, which was identified by both the appendix and ileocecal valve. No adverse events experienced.  The sigmoid colon was tortuous. It was moderately difficult to advance around the sigmoid colon.  The quality of the prep was [employer] The instrument was then slowly withdrawn as the colon was fully examined.  COLON FINDINGS: Moderate diverticulosis was noted in the descending colon and sigmoid colon.   The colon was otherwise normal.  There was no diverticulosis, inflammation, polyps or cancers unless previously stated.  Retroflexed views revealed small internal hemorrhoids. The time to cecum=8 minutes 36 seconds.  Withdrawal time=8 minutes 34 seconds.  The scope was withdrawn and the procedure  completed.  COMPLICATIONS: There were no complications.  ENDOSCOPIC IMPRESSION: 1.   Moderate diverticulosis was noted in the descending colon and sigmoid colon 2.   Small internal hemorroids  RECOMMENDATIONS: 1.  High fiber diet with liberal fluid intake. 2.  Repeat Colonoscopy in 5 years.  eSigned:  Meryl Dare, MD, [facility] 01/13/2013 8:58 AM

## 2013-01-13 NOTE — Progress Notes (Signed)
Patient did not experience any of the following events: a burn prior to discharge; a fall within the facility; wrong site/side/patient/procedure/implant event; or a hospital transfer or hospital admission upon discharge from the facility. (G8907) Patient did not have preoperative order for IV antibiotic SSI prophylaxis. (G8918)  

## 2013-01-13 NOTE — Progress Notes (Signed)
Procedure ends, to recovery, report given and VSS. 

## 2013-01-13 NOTE — Patient Instructions (Signed)
YOU HAD AN ENDOSCOPIC PROCEDURE TODAY AT THE [facility]: Refer to the procedure report that was given to you for any specific questions about what was found during the examination.  If the procedure report does not answer your questions, please call your gastroenterologist to clarify.  If you requested that your care partner not be given the details of your procedure findings, then the procedure report has been included in a sealed envelope for you to review at your convenience later.  YOU SHOULD EXPECT: Some feelings of bloating in the abdomen. Passage of more gas than usual.  Walking can help get rid of the air that was put into your GI tract during the procedure and reduce the bloating. If you had a lower endoscopy (such as a colonoscopy or flexible sigmoidoscopy) you may notice spotting of blood in your stool or on the toilet paper. If you underwent a bowel prep for your procedure, then you may not have a normal bowel movement for a few days.  DIET: Your first meal following the procedure should be a light meal and then it is ok to progress to your normal diet.  A half-sandwich or bowl of soup is an example of a good first meal.  Heavy or fried foods are harder to digest and may make you feel nauseous or bloated.  Likewise meals heavy in dairy and vegetables can cause extra gas to form and this can also increase the bloating.  Drink plenty of fluids but you should avoid alcoholic beverages for 24 hours.  ACTIVITY: Your care partner should take you home directly after the procedure.  You should plan to take it easy, moving slowly for the rest of the day.  You can resume normal activity the day after the procedure however you should NOT DRIVE or use heavy machinery for 24 hours (because of the sedation medicines used during the test).    SYMPTOMS TO REPORT IMMEDIATELY: A gastroenterologist can be reached at any hour.  During normal business hours, 8:30 AM to 5:00 PM Monday through Friday,  call [phone].  After hours and on weekends, please call the GI answering service at [phone] who will take a message and have the physician on call contact you.   Following lower endoscopy (colonoscopy or flexible sigmoidoscopy):  Excessive amounts of blood in the stool  Significant tenderness or worsening of abdominal pains  Swelling of the abdomen that is new, acute  Fever of 100F or higher    FOLLOW UP: If any biopsies were taken you will be contacted by phone or by letter within the next 1-3 weeks.  Call your gastroenterologist if you have not heard about the biopsies in 3 weeks.  Our staff will call the home number listed on your records the next business day following your procedure to check on you and address any questions or concerns that you may have at that time regarding the information given to you following your procedure. This is a courtesy call and so if there is no answer at the home number and we have not heard from you through the emergency physician on call, we will assume that you have returned to your regular daily activities without incident.  SIGNATURES/CONFIDENTIALITY: You and/or your care partner have signed paperwork which will be entered into your electronic medical record.  These signatures attest to the fact that that the information above on your After Visit Summary has been reviewed and is understood.  Full responsibility of the confidentiality   of this discharge information lies with you and/or your care-partner.     

## 2013-01-16 ENCOUNTER — Telehealth: Payer: Self-pay

## 2013-01-16 NOTE — Telephone Encounter (Signed)
  Follow up Call-  Call back number 01/13/2013  Post procedure Call Back phone  # [phone] or [phone]  Permission to leave phone message Yes     Patient questions:  Do you have a fever, pain , or abdominal swelling? no Pain Score  0 *  Have you tolerated food without any problems? yes  Have you been able to return to your normal activities? yes  Do you have any questions about your discharge instructions: Diet   no Medications  no Follow up visit  no  Do you have questions or concerns about your Care? no  Actions: * If pain score is 4 or above: No action needed, pain <4.

## 2013-01-18 ENCOUNTER — Encounter: Payer: Medicare Other | Admitting: Gastroenterology

## 2013-01-24 ENCOUNTER — Encounter: Payer: Self-pay | Admitting: Cardiovascular Disease

## 2013-01-24 ENCOUNTER — Ambulatory Visit (INDEPENDENT_AMBULATORY_CARE_PROVIDER_SITE_OTHER): Payer: Medicare Other | Admitting: Cardiovascular Disease

## 2013-01-24 VITALS — BP 158/98 | HR 80 | Ht 62.5 in | Wt 206.8 lb

## 2013-01-24 DIAGNOSIS — E785 Hyperlipidemia, unspecified: Secondary | ICD-10-CM

## 2013-01-24 DIAGNOSIS — I2510 Atherosclerotic heart disease of native coronary artery without angina pectoris: Secondary | ICD-10-CM

## 2013-01-24 DIAGNOSIS — I10 Essential (primary) hypertension: Secondary | ICD-10-CM

## 2013-01-24 NOTE — Progress Notes (Signed)
HPI  This is a pleasant 66 year old female who was referred by Dr. Dan Humphreys to establish cardiovascular care. She has known history of coronary artery disease s/p stent placement to the right coronary artery in 1999. No cardiac events since then. Most recent stress test was in 2003 which showed no evidence of ischemia. Other medical conditions include hypertension and hyperlipidemia with previous myalgia with atorvastatin. She reports high blood pressure readings when she is at the physician's office. Overall she has been stable and denies any chest pain, dyspnea, palpitations or syncope.  Allergies  Allergen Reactions  . Lipitor [Atorvastatin]     myalgia     Current Outpatient Prescriptions on File Prior to Visit  Medication Sig Dispense Refill  . amLODipine (NORVASC) 10 MG tablet Take 1 tablet (10 mg total) by mouth daily.  90 tablet  4  . aspirin EC 81 MG tablet Take 81 mg by mouth daily.      Marland Kitchen FLUoxetine (PROZAC) 20 MG tablet Take 2 tablets (40 mg total) by mouth daily.  60 tablet  3  . hydrochlorothiazide (HYDRODIURIL) 50 MG tablet Take 1 tablet (50 mg total) by mouth daily.  90 tablet  4  . metoprolol succinate (TOPROL-XL) 50 MG 24 hr tablet Take 1 tablet (50 mg total) by mouth daily. Take with or immediately following a meal.  90 tablet  4  . rosuvastatin (CRESTOR) 5 MG tablet Take 5 mg by mouth daily.      . traMADol (ULTRAM) 50 MG tablet Take 1 tablet (50 mg total) by mouth every 8 (eight) hours as needed for pain.  90 tablet  6   No current facility-administered medications on file prior to visit.     Past Medical History  Diagnosis Date  . Hypertension   . Coronary artery disease 1999    2x stents, Dr. Juliann Pares  . Anxiety   . Hyperlipidemia   . Myocardial infarct, old     15 years ago  . Sleep apnea      Past Surgical History  Procedure Laterality Date  . Coronary stent placement      multiple, x 2  . Appendectomy    . Cholecystectomy    . Hernia repair  Bilateral     x3  . Abdominal hysterectomy    . Bladder repair    . Cataracts      both eyes  . Carpal tunnel release      left  . Cardiac catheterization      [facility] x2 stent  . Back surgery       Family History  Problem Relation Age of Onset  . Heart disease Mother   . Stroke Mother   . Heart attack Mother   . COPD Father   . Heart disease Father   . Heart attack Father   . Cancer Sister 35    colon  . Stroke Brother   . Colon cancer Brother   . Esophageal cancer Neg Hx   . Stomach cancer Neg Hx   . Rectal cancer Neg Hx      History   Social History  . Marital Status: Widowed    Spouse Name: N/A    Number of Children: N/A  . Years of Education: N/A   Occupational History  . Not on file.   Social History Main Topics  . Smoking status: Never Smoker   . Smokeless tobacco: Never Used  . Alcohol Use: No     Comment: very  rarely  . Drug Use: No  . Sexual Activity: Not on file   Other Topics Concern  . Not on file   Social History Narrative   Lives in [place] with 10 YO nephew and husband. 3 dogs outside.      Work - Therapist, sports, [employer]      Diet - regular, limited meat      Exercise - walks some     ROS A 10 point review of system was performed. It is negative other than what is mentioned in the history of present illness.  PHYSICAL EXAM   BP 158/98  Pulse 80  Ht 5' 2.5" (1.588 m)  Wt 206 lb 12 oz (93.781 kg)  BMI 37.19 kg/m2 Constitutional: She is oriented to person, place, and time. She appears well-developed and well-nourished. No distress.  HENT: No nasal discharge.  Head: Normocephalic and atraumatic.  Eyes: Pupils are equal and round. Right eye exhibits no discharge. Left eye exhibits no discharge.  Neck: Normal range of motion. Neck supple. No JVD present. No thyromegaly present.  Cardiovascular: Normal rate, regular rhythm, normal heart sounds. Exam reveals no gallop and no friction rub. No murmur heard.    Pulmonary/Chest: Effort normal and breath sounds normal. No stridor. No respiratory distress. She has no wheezes. She has no rales. She exhibits no tenderness.  Abdominal: Soft. Bowel sounds are normal. She exhibits no distension. There is no tenderness. There is no rebound and no guarding.  Musculoskeletal: Normal range of motion. She exhibits no edema and no tenderness.  Neurological: She is alert and oriented to person, place, and time. Coordination normal.  Skin: Skin is warm and dry. No rash noted. She is not diaphoretic. No erythema. No pallor.  Psychiatric: She has a normal mood and affect. Her behavior is normal. Judgment and thought content normal.     EKG: Sinus  Rhythm  WITHIN NORMAL LIMITS   ASSESSMENT AND PLAN

## 2013-01-24 NOTE — Patient Instructions (Addendum)
Continue same medications  Follow up in 1 year

## 2013-01-29 ENCOUNTER — Encounter: Payer: Self-pay | Admitting: Cardiovascular Disease

## 2013-01-29 NOTE — Assessment & Plan Note (Signed)
Previous stenting of the right coronary artery in 1999. Currently, she has no symptoms suggestive of angina, heart failure or arrhythmia. Resting EKG is normal. Continue medical therapy.

## 2013-01-29 NOTE — Assessment & Plan Note (Signed)
Lab Results  Component Value Date   CHOL 225* 11/16/2012   HDL 50.20 11/16/2012   LDLDIRECT 164.8 11/16/2012   TRIG 141.0 11/16/2012   CHOLHDL 4 11/16/2012   She reports intolerance to atorvastatin. She is on small dose  rosuvastatin which should be continued given her established history of coronary artery disease with significant hyperlipidemia.

## 2013-01-29 NOTE — Assessment & Plan Note (Signed)
She probably has white coat syndrome. Continue to monitor blood pressure.

## 2013-02-14 ENCOUNTER — Telehealth: Payer: Self-pay | Admitting: *Deleted

## 2013-02-14 DIAGNOSIS — F411 Generalized anxiety disorder: Secondary | ICD-10-CM

## 2013-02-14 NOTE — Telephone Encounter (Signed)
That is fine 

## 2013-02-14 NOTE — Telephone Encounter (Signed)
Patient has transferred her prescriptions to [facility], they would like to know if it would be ok to dispense capsules instead of tablets for her Fluoxetine?

## 2013-02-15 MED ORDER — FLUOXETINE HCL 20 MG PO TABS: 40.0000 mg | ORAL_TABLET | Freq: Every day | ORAL | Status: DC

## 2013-02-15 NOTE — Telephone Encounter (Signed)
Sent prescription to pharmacy.  

## 2013-02-20 ENCOUNTER — Encounter: Payer: Self-pay | Admitting: Internal Medicine

## 2013-03-27 ENCOUNTER — Encounter: Payer: Self-pay | Admitting: *Deleted

## 2013-03-28 ENCOUNTER — Encounter: Payer: Self-pay | Admitting: Internal Medicine

## 2013-03-28 ENCOUNTER — Ambulatory Visit (INDEPENDENT_AMBULATORY_CARE_PROVIDER_SITE_OTHER): Payer: Medicare Other | Admitting: Internal Medicine

## 2013-03-28 VITALS — BP 180/98 | HR 74 | Temp 98.6°F | Ht 62.25 in | Wt 206.0 lb

## 2013-03-28 DIAGNOSIS — F411 Generalized anxiety disorder: Secondary | ICD-10-CM

## 2013-03-28 DIAGNOSIS — Z0000 Encounter for general adult medical examination without abnormal findings: Secondary | ICD-10-CM

## 2013-03-28 DIAGNOSIS — R739 Hyperglycemia, unspecified: Secondary | ICD-10-CM

## 2013-03-28 DIAGNOSIS — E785 Hyperlipidemia, unspecified: Secondary | ICD-10-CM

## 2013-03-28 DIAGNOSIS — I10 Essential (primary) hypertension: Secondary | ICD-10-CM

## 2013-03-28 DIAGNOSIS — R7309 Other abnormal glucose: Secondary | ICD-10-CM

## 2013-03-28 DIAGNOSIS — M2550 Pain in unspecified joint: Secondary | ICD-10-CM

## 2013-03-28 DIAGNOSIS — Z23 Encounter for immunization: Secondary | ICD-10-CM

## 2013-03-28 LAB — CBC WITH DIFFERENTIAL/PLATELET
Basophils Absolute: 0 10*3/uL (ref 0.0–0.1)
Basophils Relative: 0.7 % (ref 0.0–3.0)
Eosinophils Absolute: 0.5 10*3/uL (ref 0.0–0.7)
Eosinophils Relative: 9.3 % — ABNORMAL HIGH (ref 0.0–5.0)
Lymphocytes Relative: 25.6 % (ref 12.0–46.0)
Monocytes Absolute: 0.5 10*3/uL (ref 0.1–1.0)
Monocytes Relative: 9.4 % (ref 3.0–12.0)
Neutrophils Relative %: 55 % (ref 43.0–77.0)
RBC: 4.87 Mil/uL (ref 3.87–5.11)
RDW: 16.6 % — ABNORMAL HIGH (ref 11.5–14.6)
WBC: 5.3 10*3/uL (ref 4.5–10.5)

## 2013-03-28 LAB — LIPID PANEL
Cholesterol: 264 mg/dL — ABNORMAL HIGH (ref 0–200)
Triglycerides: 178 mg/dL — ABNORMAL HIGH (ref 0.0–149.0)

## 2013-03-28 LAB — COMPREHENSIVE METABOLIC PANEL
Albumin: 4 g/dL (ref 3.5–5.2)
Alkaline Phosphatase: 63 U/L (ref 39–117)
BUN: 19 mg/dL (ref 6–23)
CO2: 28 mEq/L (ref 19–32)
Calcium: 9 mg/dL (ref 8.4–10.5)
Chloride: 102 mEq/L (ref 96–112)
GFR: 74.28 mL/min (ref >60.00)
Glucose, Bld: 113 mg/dL — ABNORMAL HIGH (ref 70–99)
Potassium: 4 mEq/L (ref 3.5–5.1)

## 2013-03-28 LAB — MICROALBUMIN / CREATININE URINE RATIO
Creatinine,U: 116.9 mg/dL
Microalb Creat Ratio: 1.5 mg/g (ref 0.0–30.0)
Microalb, Ur: 1.7 mg/dL (ref 0.0–1.9)

## 2013-03-28 LAB — LDL CHOLESTEROL, DIRECT: Direct LDL: 206.9 mg/dL

## 2013-03-28 MED ORDER — AMLODIPINE BESYLATE 10 MG PO TABS: 10.0000 mg | ORAL_TABLET | Freq: Every day | ORAL | Status: DC

## 2013-03-28 MED ORDER — TRAMADOL HCL 50 MG PO TABS: 50.0000 mg | ORAL_TABLET | Freq: Three times a day (TID) | ORAL | Status: DC | PRN

## 2013-03-28 MED ORDER — HYDROCHLOROTHIAZIDE 50 MG PO TABS: 50.0000 mg | ORAL_TABLET | Freq: Every day | ORAL | Status: DC

## 2013-03-28 MED ORDER — ZOSTER VACCINE LIVE 19400 UNT/0.65ML ~~LOC~~ SOLR: 0.6500 mL | Freq: Once | SUBCUTANEOUS | Status: DC

## 2013-03-28 MED ORDER — METOPROLOL SUCCINATE ER 50 MG PO TB24: 50.0000 mg | ORAL_TABLET | Freq: Every day | ORAL | Status: DC

## 2013-03-28 MED ORDER — ROSUVASTATIN CALCIUM 5 MG PO TABS: 5.0000 mg | ORAL_TABLET | Freq: Every day | ORAL | Status: DC

## 2013-03-28 MED ORDER — FLUOXETINE HCL 20 MG PO TABS: 40.0000 mg | ORAL_TABLET | Freq: Every day | ORAL | Status: DC

## 2013-03-28 NOTE — Progress Notes (Signed)
Subjective:    Patient ID: Debbie Delacruz, female    DOB: 1945-06-27, 67 y.o.   MRN: [id number]  HPI The patient is here for annual Medicare wellness examination and management of other chronic and acute problems.   The risk factors are reflected in the social history.  The roster of all physicians providing medical care to patient - is listed in the Snapshot section of the chart.  Activities of daily living:  The patient is 100% independent in all ADLs: dressing, toileting, feeding as well as independent mobility  Home safety : The patient has smoke detectors in the home. They wear seatbelts.  There are no firearms at home. There is no violence in the home.   There is no risks for hepatitis, STDs or HIV. There is no history of blood transfusion. They have no travel history to infectious disease endemic areas of the world.  The patient has seen their dentist in the last six month. Dentist - Dr. Jolee Ewing  They have seen their eye doctor in the last year. Opthalmology - Dr. Rachael Darby No issues with hearing. They have deferred audiologic testing in the last year.   They do not  have excessive sun exposure. Discussed the need for sun protection: hats, long sleeves and use of sunscreen if there is significant sun exposure. Dermatologist - Dr. Adolphus Birchwood  Diet: the importance of a healthy diet is discussed. They do have a relatively healthy diet.  The benefits of regular aerobic exercise were discussed. She walks 2 nights per week.  Depression screen: there are no signs or vegative symptoms of depression- irritability, change in appetite, anhedonia, sadness/tearfullness.  Cognitive assessment: the patient manages all their financial and personal affairs and is actively engaged. They could relate day,date,year and events.  HCPOA - husband, Margarette Canada  The following portions of the patient's history were reviewed and updated as appropriate: allergies, current medications, past family history,  past medical history,  past surgical history, past social history  and problem list.  Visual acuity was not assessed per patient preference since she has regular follow up with her ophthalmologist. Hearing and body mass index were assessed and reviewed.   During the course of the visit the patient was educated and counseled about appropriate screening and preventive services including : fall prevention , diabetes screening, nutrition counseling, colorectal cancer screening, and recommended immunizations.    Outpatient Prescriptions Prior to Visit  Medication Sig Dispense Refill  . aspirin EC 81 MG tablet Take 81 mg by mouth daily.      Marland Kitchen amLODipine (NORVASC) 10 MG tablet Take 1 tablet (10 mg total) by mouth daily.  90 tablet  4  . FLUoxetine (PROZAC) 20 MG tablet Take 2 tablets (40 mg total) by mouth daily. Okay to dispense capsules  60 tablet  3  . hydrochlorothiazide (HYDRODIURIL) 50 MG tablet Take 1 tablet (50 mg total) by mouth daily.  90 tablet  4  . metoprolol succinate (TOPROL-XL) 50 MG 24 hr tablet Take 1 tablet (50 mg total) by mouth daily. Take with or immediately following a meal.  90 tablet  4  . rosuvastatin (CRESTOR) 5 MG tablet Take 5 mg by mouth daily.      . traMADol (ULTRAM) 50 MG tablet Take 1 tablet (50 mg total) by mouth every 8 (eight) hours as needed for pain.  90 tablet  6   No facility-administered medications prior to visit.   BP 180/98  Pulse 74  Temp(Src) 98.6 F (37 C) (  Oral)  Ht 5' 2.25" (1.581 m)  Wt 206 lb (93.441 kg)  BMI 37.38 kg/m2  SpO2 96%   Review of Systems  Constitutional: Negative for fever, chills, appetite change, fatigue and unexpected weight change.  HENT: Negative for congestion, ear pain, sinus pressure, sore throat, trouble swallowing and voice change.   Eyes: Negative for visual disturbance.  Respiratory: Negative for cough, shortness of breath, wheezing and stridor.   Cardiovascular: Negative for chest pain, palpitations and leg  swelling.  Gastrointestinal: Negative for nausea, vomiting, abdominal pain, diarrhea, constipation, blood in stool, abdominal distention and anal bleeding.  Genitourinary: Negative for dysuria and flank pain.  Musculoskeletal: Negative for arthralgias, gait problem, myalgias and neck pain.  Skin: Negative for color change and rash.  Neurological: Negative for dizziness and headaches.  Hematological: Negative for adenopathy. Does not bruise/bleed easily.  Psychiatric/Behavioral: Negative for suicidal ideas, sleep disturbance and dysphoric mood. The patient is not nervous/anxious.        Objective:   Physical Exam  Constitutional: She is oriented to person, place, and time. She appears well-developed and well-nourished. No distress.  HENT:  Head: Normocephalic and atraumatic.  Right Ear: External ear normal.  Left Ear: External ear normal.  Nose: Nose normal.  Mouth/Throat: Oropharynx is clear and moist. No oropharyngeal exudate.  Eyes: Conjunctivae are normal. Pupils are equal, round, and reactive to light. Right eye exhibits no discharge. Left eye exhibits no discharge. No scleral icterus.  Neck: Normal range of motion. Neck supple. No tracheal deviation present. No thyromegaly present.  Cardiovascular: Normal rate, regular rhythm, normal heart sounds and intact distal pulses.  Exam reveals no gallop and no friction rub.   No murmur heard. Pulmonary/Chest: Effort normal and breath sounds normal. No accessory muscle usage. Not tachypneic. No respiratory distress. She has no decreased breath sounds. She has no wheezes. She has no rhonchi. She has no rales. She exhibits no tenderness. Right breast exhibits no inverted nipple, no mass, no nipple discharge, no skin change and no tenderness. Left breast exhibits no inverted nipple, no mass, no nipple discharge, no skin change and no tenderness. Breasts are symmetrical.  Abdominal: Soft. Bowel sounds are normal. She exhibits no distension and no  mass. There is no tenderness. There is no rebound and no guarding.  Musculoskeletal: Normal range of motion. She exhibits no edema and no tenderness.  Lymphadenopathy:    She has no cervical adenopathy.  Neurological: She is alert and oriented to person, place, and time. No cranial nerve deficit. She exhibits normal muscle tone. Coordination normal.  Skin: Skin is warm and dry. No rash noted. She is not diaphoretic. No erythema. No pallor.  Psychiatric: She has a normal mood and affect. Her behavior is normal. Judgment and thought content normal.          Assessment & Plan:

## 2013-03-28 NOTE — Assessment & Plan Note (Signed)
BP Readings from Last 3 Encounters:  03/28/13 180/98  01/24/13 158/98  01/13/13 160/87   BP elevated today, but pt has not taken medications. BP well controlled at home. Will continue to monitor at home. She will call if BP consistently >140/90.

## 2013-03-28 NOTE — Assessment & Plan Note (Signed)
Pt was recently started on Crestor. Will recheck lipids with labs today.

## 2013-03-28 NOTE — Progress Notes (Signed)
Pre-visit discussion using our clinic review tool. No additional management support is needed unless otherwise documented below in the visit note.  

## 2013-03-28 NOTE — Assessment & Plan Note (Signed)
General medical exam including breast exam normal today. PAP and pelvic deferred as pt s/p complete hysterectomy. Mammogram UTD and reviewed. Colonoscopy UTD and reviewed. EKG stable today. Will check labs including CBC, CMP, lipids, urine microalbumin. Flu and pneumovax given today. Pt will look into coverage for Zostavax. Encouraged healthy diet and regular exercise. Follow up 6 months and prn.

## 2013-05-09 ENCOUNTER — Other Ambulatory Visit: Payer: Self-pay | Admitting: Internal Medicine

## 2013-05-09 ENCOUNTER — Encounter: Payer: Self-pay | Admitting: Internal Medicine

## 2013-05-11 ENCOUNTER — Other Ambulatory Visit: Payer: Medicare Other

## 2013-05-23 ENCOUNTER — Encounter: Payer: Self-pay | Admitting: Internal Medicine

## 2013-06-02 ENCOUNTER — Telehealth: Payer: Self-pay | Admitting: *Deleted

## 2013-06-02 ENCOUNTER — Other Ambulatory Visit: Payer: Self-pay | Admitting: *Deleted

## 2013-06-02 DIAGNOSIS — E785 Hyperlipidemia, unspecified: Secondary | ICD-10-CM

## 2013-06-02 NOTE — Telephone Encounter (Signed)
Cmp, lipids 272.4

## 2013-06-02 NOTE — Telephone Encounter (Signed)
Pt is coming in for labs on 01.12.2015 what labs and dX?

## 2013-06-05 ENCOUNTER — Other Ambulatory Visit: Payer: Medicare Other

## 2013-06-05 ENCOUNTER — Encounter: Payer: Self-pay | Admitting: Internal Medicine

## 2013-06-08 ENCOUNTER — Encounter: Payer: Self-pay | Admitting: Internal Medicine

## 2013-06-14 ENCOUNTER — Encounter: Payer: Self-pay | Admitting: Internal Medicine

## 2013-06-15 ENCOUNTER — Other Ambulatory Visit: Payer: Medicare Other

## 2013-06-16 ENCOUNTER — Other Ambulatory Visit: Payer: Medicare Other

## 2013-06-28 ENCOUNTER — Other Ambulatory Visit: Payer: Medicare Other

## 2013-07-17 ENCOUNTER — Encounter: Payer: Self-pay | Admitting: Internal Medicine

## 2013-08-03 ENCOUNTER — Encounter: Payer: Self-pay | Admitting: Internal Medicine

## 2013-08-14 ENCOUNTER — Encounter: Payer: Self-pay | Admitting: Internal Medicine

## 2013-08-15 ENCOUNTER — Ambulatory Visit: Payer: Medicare Other | Admitting: Internal Medicine

## 2013-08-26 ENCOUNTER — Emergency Department: Payer: Self-pay | Admitting: Emergency Medicine

## 2013-08-28 ENCOUNTER — Encounter: Payer: Self-pay | Admitting: Internal Medicine

## 2013-08-29 ENCOUNTER — Ambulatory Visit (INDEPENDENT_AMBULATORY_CARE_PROVIDER_SITE_OTHER): Payer: Medicare Other | Admitting: Internal Medicine

## 2013-08-29 ENCOUNTER — Encounter: Payer: Self-pay | Admitting: Internal Medicine

## 2013-08-29 VITALS — BP 160/90 | HR 85 | Temp 98.3°F | Wt 201.0 lb

## 2013-08-29 DIAGNOSIS — R059 Cough, unspecified: Secondary | ICD-10-CM

## 2013-08-29 DIAGNOSIS — J209 Acute bronchitis, unspecified: Secondary | ICD-10-CM | POA: Insufficient documentation

## 2013-08-29 DIAGNOSIS — M2550 Pain in unspecified joint: Secondary | ICD-10-CM

## 2013-08-29 DIAGNOSIS — R05 Cough: Secondary | ICD-10-CM

## 2013-08-29 MED ORDER — AZITHROMYCIN 250 MG PO TABS: ORAL_TABLET | ORAL | Status: DC

## 2013-08-29 MED ORDER — PREDNISONE 10 MG PO TABS: ORAL_TABLET | ORAL | Status: DC

## 2013-08-29 MED ORDER — TRAMADOL HCL 50 MG PO TABS: 50.0000 mg | ORAL_TABLET | Freq: Three times a day (TID) | ORAL | Status: DC | PRN

## 2013-08-29 NOTE — Assessment & Plan Note (Signed)
Symptoms most consistent with bronchitis. Will start prednisone taper and treat with azithromycin, given some symptoms suggestive of pertussis. Will use tramadol to help with cough. Follow up for recheck on Friday. Will also request records from ED with recent CXR.

## 2013-08-29 NOTE — Progress Notes (Signed)
Pre visit review using our clinic review tool, if applicable. No additional management support is needed unless otherwise documented below in the visit note. 

## 2013-08-29 NOTE — Progress Notes (Signed)
Subjective:    Patient ID: Debbie Delacruz, female    DOB: September 19, 1945, 68 y.o.   MRN: [id number]  HPI 68YO female presents for acute visit.  4 weeks ago, had nausea/vomiting and diarrhea (entire family sick). Shortly after developed a cough. Cough has persisted last 3 weeks. Cough is dry, hacking. Sometimes with post-tussive emesis. Was seen at [facility] Saturday, sent to [facility] ED. CXR was reportedly normal. Told she was having bronchial spasms. Treated with Tussionex. No fever, shortness of breath. Cannot sleep because of cough. Never smoker. No one smokes at home. Coworkers had been ill with similar symptoms. No h/o asthma. However, had similar episode 4-5 years ago during which she had to take Prednisone to control cough symptoms.  Review of Systems  Constitutional: Positive for fatigue. Negative for fever, chills and unexpected weight change.  HENT: Negative for congestion, ear discharge, ear pain, facial swelling, hearing loss, mouth sores, nosebleeds, postnasal drip, rhinorrhea, sinus pressure, sneezing, sore throat, tinnitus, trouble swallowing and voice change.   Eyes: Negative for pain, discharge, redness and visual disturbance.  Respiratory: Positive for cough. Negative for chest tightness, shortness of breath, wheezing and stridor.   Cardiovascular: Negative for chest pain, palpitations and leg swelling.  Musculoskeletal: Negative for arthralgias, myalgias, neck pain and neck stiffness.  Skin: Negative for color change and rash.  Neurological: Negative for dizziness, weakness, light-headedness and headaches.  Hematological: Negative for adenopathy.  Psychiatric/Behavioral: Positive for sleep disturbance.       Objective:    BP 160/90  Pulse 85  Temp(Src) 98.3 F (36.8 C) (Oral)  Wt 201 lb (91.173 kg)  SpO2 97% Physical Exam  Constitutional: She is oriented to person, place, and time. She appears well-developed and well-nourished. No distress.  HENT:  Head:  Normocephalic and atraumatic.  Right Ear: External ear normal.  Left Ear: External ear normal.  Nose: Nose normal.  Mouth/Throat: Oropharynx is clear and moist. No oropharyngeal exudate.  Eyes: Conjunctivae are normal. Pupils are equal, round, and reactive to light. Right eye exhibits no discharge. Left eye exhibits no discharge. No scleral icterus.  Neck: Normal range of motion. Neck supple. No tracheal deviation present. No thyromegaly present.  Cardiovascular: Normal rate, regular rhythm, normal heart sounds and intact distal pulses.  Exam reveals no gallop and no friction rub.   No murmur heard. Pulmonary/Chest: Effort normal. No accessory muscle usage. Not tachypneic. No respiratory distress. She has no decreased breath sounds. She has no wheezes. She has rhonchi (scattered). She has no rales. She exhibits no tenderness.  Musculoskeletal: Normal range of motion. She exhibits no edema and no tenderness.  Lymphadenopathy:    She has no cervical adenopathy.  Neurological: She is alert and oriented to person, place, and time. No cranial nerve deficit. She exhibits normal muscle tone. Coordination normal.  Skin: Skin is warm and dry. No rash noted. She is not diaphoretic. No erythema. No pallor.  Psychiatric: She has a normal mood and affect. Her behavior is normal. Judgment and thought content normal.          Assessment & Plan:   Problem List Items Addressed This Visit   Acute bronchitis - Primary     Symptoms most consistent with bronchitis. Will start prednisone taper and treat with azithromycin, given some symptoms suggestive of pertussis. Will use tramadol to help with cough. Follow up for recheck on Friday. Will also request records from ED with recent CXR.    Relevant Medications  predniSONE (DELTASONE) tablet   Arthralgia   Relevant Medications      traMADol (ULTRAM) 50 MG tablet    Other Visit Diagnoses   Cough        Relevant Medications       azithromycin  (ZITHROMAX) tablet        Return in about 3 days (around 09/01/2013) for Recheck.

## 2013-08-31 ENCOUNTER — Encounter: Payer: Self-pay | Admitting: Internal Medicine

## 2013-09-01 ENCOUNTER — Ambulatory Visit: Payer: Medicare Other | Admitting: Internal Medicine

## 2013-09-26 ENCOUNTER — Ambulatory Visit: Payer: Medicare Other | Admitting: Internal Medicine

## 2013-11-01 ENCOUNTER — Ambulatory Visit: Payer: Medicare Other | Admitting: Internal Medicine

## 2013-11-03 ENCOUNTER — Emergency Department: Payer: Self-pay | Admitting: Emergency Medicine

## 2013-11-10 ENCOUNTER — Encounter: Payer: Self-pay | Admitting: Internal Medicine

## 2013-11-30 ENCOUNTER — Other Ambulatory Visit: Payer: Self-pay

## 2013-11-30 DIAGNOSIS — Z1231 Encounter for screening mammogram for malignant neoplasm of breast: Secondary | ICD-10-CM

## 2013-12-06 ENCOUNTER — Ambulatory Visit: Payer: Medicare Other | Admitting: Internal Medicine

## 2013-12-27 ENCOUNTER — Ambulatory Visit: Payer: Medicare Other

## 2014-01-19 ENCOUNTER — Ambulatory Visit: Payer: Medicare Other | Admitting: Internal Medicine

## 2014-01-24 ENCOUNTER — Ambulatory Visit: Payer: Medicare Other

## 2014-01-30 ENCOUNTER — Ambulatory Visit: Payer: Medicare Other | Admitting: Cardiovascular Disease

## 2014-02-05 ENCOUNTER — Ambulatory Visit: Payer: Medicare Other

## 2014-02-12 ENCOUNTER — Ambulatory Visit: Payer: Medicare Other

## 2014-02-13 ENCOUNTER — Ambulatory Visit: Payer: Medicare Other | Admitting: Cardiovascular Disease

## 2014-02-20 ENCOUNTER — Ambulatory Visit: Payer: Medicare Other | Admitting: Internal Medicine

## 2014-02-20 ENCOUNTER — Ambulatory Visit: Payer: Medicare Other | Admitting: Cardiovascular Disease

## 2014-02-20 DIAGNOSIS — Z0289 Encounter for other administrative examinations: Secondary | ICD-10-CM

## 2014-02-23 ENCOUNTER — Ambulatory Visit: Payer: Medicare Other | Admitting: Cardiovascular Disease

## 2014-02-26 ENCOUNTER — Ambulatory Visit: Payer: Medicare Other

## 2014-03-02 ENCOUNTER — Ambulatory Visit: Payer: Medicare Other | Admitting: Cardiovascular Disease

## 2014-03-15 ENCOUNTER — Ambulatory Visit: Payer: Medicare Other | Admitting: Cardiovascular Disease

## 2014-03-20 ENCOUNTER — Ambulatory Visit: Payer: Medicare Other | Admitting: Cardiovascular Disease

## 2014-03-20 ENCOUNTER — Ambulatory Visit: Payer: Medicare Other

## 2014-04-02 ENCOUNTER — Other Ambulatory Visit: Payer: Self-pay | Admitting: Internal Medicine

## 2014-04-02 ENCOUNTER — Encounter: Payer: Self-pay | Admitting: Cardiovascular Disease

## 2014-04-03 ENCOUNTER — Ambulatory Visit: Payer: Medicare Other | Admitting: Cardiovascular Disease

## 2014-04-06 ENCOUNTER — Ambulatory Visit: Payer: Medicare Other

## 2014-04-17 ENCOUNTER — Ambulatory Visit: Payer: Medicare Other | Admitting: Cardiovascular Disease

## 2014-04-18 ENCOUNTER — Encounter: Payer: Medicare Other | Admitting: Internal Medicine

## 2014-05-25 DIAGNOSIS — Z9289 Personal history of other medical treatment: Secondary | ICD-10-CM

## 2014-05-25 HISTORY — DX: Personal history of other medical treatment: Z92.89

## 2014-05-30 ENCOUNTER — Encounter: Payer: Medicare Other | Admitting: Internal Medicine

## 2014-06-05 ENCOUNTER — Ambulatory Visit: Payer: Medicare Other | Admitting: Cardiovascular Disease

## 2014-06-05 ENCOUNTER — Observation Stay: Payer: Self-pay | Admitting: Internal Medicine

## 2014-06-05 LAB — URINALYSIS, COMPLETE
BILIRUBIN, UR: NEGATIVE
Bacteria: NONE SEEN
Blood: NEGATIVE
Glucose,UR: NEGATIVE mg/dL (ref 0–75)
KETONE: NEGATIVE
LEUKOCYTE ESTERASE: NEGATIVE
Nitrite: NEGATIVE
PH: 7 (ref 4.5–8.0)
PROTEIN: NEGATIVE
RBC,UR: <1 /HPF (ref 0–5)
SPECIFIC GRAVITY: 1.012 (ref 1.003–1.030)
Squamous Epithelial: 2
WBC UR: <1 /HPF (ref 0–5)

## 2014-06-05 LAB — COMPREHENSIVE METABOLIC PANEL
ANION GAP: 10 (ref 7–16)
AST: 25 U/L (ref 15–37)
Albumin: 3.4 g/dL (ref 3.4–5.0)
Alkaline Phosphatase: 71 U/L
BUN: 16 mg/dL (ref 7–18)
Bilirubin,Total: 0.4 mg/dL (ref 0.2–1.0)
CHLORIDE: 102 mmol/L (ref 98–107)
Calcium, Total: 8.3 mg/dL — ABNORMAL LOW (ref 8.5–10.1)
Co2: 25 mmol/L (ref 21–32)
Creatinine: 1.06 mg/dL (ref 0.60–1.30)
EGFR (Non-African Amer.): 55 — ABNORMAL LOW
Glucose: 202 mg/dL — ABNORMAL HIGH (ref 65–99)
OSMOLALITY: 281 (ref 275–301)
POTASSIUM: 3.3 mmol/L — AB (ref 3.5–5.1)
SGPT (ALT): 22 U/L
SODIUM: 137 mmol/L (ref 136–145)
TOTAL PROTEIN: 7.2 g/dL (ref 6.4–8.2)

## 2014-06-05 LAB — IRON AND TIBC
IRON BIND. CAP.(TOTAL): 532 ug/dL — AB (ref 250–450)
Iron Saturation: 4 %
Iron: 20 ug/dL — ABNORMAL LOW (ref 50–170)
Unbound Iron-Bind.Cap.: 512 ug/dL

## 2014-06-05 LAB — TROPONIN I
TROPONIN-I: 0.68 ng/mL — AB
TROPONIN-I: 0.53 ng/mL — AB
Troponin-I: 0.53 ng/mL — ABNORMAL HIGH

## 2014-06-05 LAB — RETICULOCYTES
Absolute Retic Count: 0.1765 10*6/uL (ref 0.019–0.186)
Reticulocyte: 3.99 % — ABNORMAL HIGH (ref 0.4–3.1)

## 2014-06-05 LAB — CBC
HCT: 25.5 % — AB (ref 35.0–47.0)
HGB: 7 g/dL — ABNORMAL LOW (ref 12.0–16.0)
MCH: 16 pg — ABNORMAL LOW (ref 26.0–34.0)
MCHC: 27.5 g/dL — ABNORMAL LOW (ref 32.0–36.0)
MCV: 58 fL — AB (ref 80–100)
Platelet: 291 10*3/uL (ref 150–440)
RBC: 4.37 10*6/uL (ref 3.80–5.20)
RDW: 19.3 % — ABNORMAL HIGH (ref 11.5–14.5)
WBC: 6.7 10*3/uL (ref 3.6–11.0)

## 2014-06-05 LAB — CK-MB: CK-MB: 1.1 ng/mL (ref 0.5–3.6)

## 2014-06-05 LAB — FERRITIN: Ferritin (ARMC): 3 ng/mL — ABNORMAL LOW (ref 8–388)

## 2014-06-06 ENCOUNTER — Telehealth: Payer: Self-pay | Admitting: *Deleted

## 2014-06-06 LAB — CBC WITH DIFFERENTIAL/PLATELET
BASOS PCT: 1 %
Basophil #: 0.1 10*3/uL (ref 0.0–0.1)
EOS ABS: 0.3 10*3/uL (ref 0.0–0.7)
EOS PCT: 4.2 %
HCT: 32.2 % — ABNORMAL LOW (ref 35.0–47.0)
HGB: 9.5 g/dL — ABNORMAL LOW (ref 12.0–16.0)
Lymphocyte #: 1.6 10*3/uL (ref 1.0–3.6)
Lymphocyte %: 26.5 %
MCH: 18.7 pg — AB (ref 26.0–34.0)
MCHC: 29.3 g/dL — AB (ref 32.0–36.0)
MCV: 64 fL — ABNORMAL LOW (ref 80–100)
Monocyte #: 0.8 x10 3/mm (ref 0.2–0.9)
Monocyte %: 13.7 %
Neutrophil #: 3.4 10*3/uL (ref 1.4–6.5)
Neutrophil %: 54.6 %
Platelet: 269 10*3/uL (ref 150–440)
RBC: 5.05 10*6/uL (ref 3.80–5.20)
RDW: 28.2 % — ABNORMAL HIGH (ref 11.5–14.5)
WBC: 6.2 10*3/uL (ref 3.6–11.0)

## 2014-06-06 LAB — BASIC METABOLIC PANEL
ANION GAP: 7 (ref 7–16)
BUN: 20 mg/dL — AB (ref 7–18)
Calcium, Total: 8.2 mg/dL — ABNORMAL LOW (ref 8.5–10.1)
Chloride: 101 mmol/L (ref 98–107)
Co2: 29 mmol/L (ref 21–32)
Creatinine: 1.05 mg/dL (ref 0.60–1.30)
EGFR (Non-African Amer.): 56 — ABNORMAL LOW
Glucose: 106 mg/dL — ABNORMAL HIGH (ref 65–99)
Osmolality: 277 (ref 275–301)
Potassium: 3.3 mmol/L — ABNORMAL LOW (ref 3.5–5.1)
SODIUM: 137 mmol/L (ref 136–145)

## 2014-06-06 LAB — HEMOGLOBIN A1C: Hemoglobin A1C: 5.6 % (ref 4.2–6.3)

## 2014-06-06 NOTE — Telephone Encounter (Signed)
Pt discharged from [facility] today, scheduled pt hospital follow up appt for 06/08/14.  Pt is being seen in 2 business days, therefore no TCM call required.

## 2014-06-08 ENCOUNTER — Ambulatory Visit: Payer: Medicare Other | Admitting: Internal Medicine

## 2014-06-12 ENCOUNTER — Ambulatory Visit: Payer: Medicare Other | Admitting: Cardiovascular Disease

## 2014-06-22 ENCOUNTER — Encounter: Payer: Self-pay | Admitting: Cardiovascular Disease

## 2014-06-25 HISTORY — PX: CARDIAC CATHETERIZATION: SHX172

## 2014-07-03 ENCOUNTER — Encounter: Payer: Self-pay | Admitting: Internal Medicine

## 2014-07-04 ENCOUNTER — Encounter: Payer: Medicare Other | Admitting: Internal Medicine

## 2014-07-11 ENCOUNTER — Ambulatory Visit: Payer: Self-pay | Admitting: Cardiology

## 2014-07-26 ENCOUNTER — Ambulatory Visit: Payer: Self-pay | Admitting: Unknown Physician Specialty

## 2014-08-09 ENCOUNTER — Encounter: Payer: Medicare Other | Admitting: Internal Medicine

## 2014-08-09 ENCOUNTER — Telehealth: Payer: Self-pay

## 2014-08-09 NOTE — Telephone Encounter (Signed)
Called the patient to inquire about not showing for her appointment, no answer, left a voice mail on her machine.

## 2014-08-14 ENCOUNTER — Ambulatory Visit: Payer: Self-pay | Admitting: Unknown Physician Specialty

## 2014-08-14 ENCOUNTER — Encounter: Payer: Self-pay | Admitting: Internal Medicine

## 2014-08-21 ENCOUNTER — Ambulatory Visit: Payer: Self-pay | Admitting: Unknown Physician Specialty

## 2014-08-27 ENCOUNTER — Other Ambulatory Visit: Payer: Self-pay | Admitting: Internal Medicine

## 2014-09-07 DIAGNOSIS — I219 Acute myocardial infarction, unspecified: Secondary | ICD-10-CM | POA: Insufficient documentation

## 2014-09-07 DIAGNOSIS — I2510 Atherosclerotic heart disease of native coronary artery without angina pectoris: Secondary | ICD-10-CM | POA: Insufficient documentation

## 2014-09-10 ENCOUNTER — Encounter: Payer: Self-pay | Admitting: Thoracic Surgery (Cardiothoracic Vascular Surgery)

## 2014-09-11 ENCOUNTER — Encounter: Payer: Self-pay | Admitting: Internal Medicine

## 2014-09-11 ENCOUNTER — Encounter: Payer: Self-pay | Admitting: *Deleted

## 2014-09-11 ENCOUNTER — Other Ambulatory Visit: Payer: Self-pay | Admitting: *Deleted

## 2014-09-11 ENCOUNTER — Encounter: Payer: Self-pay | Admitting: Thoracic Surgery (Cardiothoracic Vascular Surgery)

## 2014-09-11 ENCOUNTER — Ambulatory Visit (INDEPENDENT_AMBULATORY_CARE_PROVIDER_SITE_OTHER): Payer: Medicare Other | Admitting: Internal Medicine

## 2014-09-11 ENCOUNTER — Institutional Professional Consult (permissible substitution) (INDEPENDENT_AMBULATORY_CARE_PROVIDER_SITE_OTHER): Payer: Medicare Other | Admitting: Thoracic Surgery (Cardiothoracic Vascular Surgery)

## 2014-09-11 VITALS — BP 158/83 | HR 74 | Temp 98.2°F | Ht 62.25 in | Wt 179.0 lb

## 2014-09-11 VITALS — BP 142/88 | HR 80 | Resp 16 | Ht 63.0 in | Wt 179.0 lb

## 2014-09-11 DIAGNOSIS — I2510 Atherosclerotic heart disease of native coronary artery without angina pectoris: Secondary | ICD-10-CM

## 2014-09-11 DIAGNOSIS — I2583 Coronary atherosclerosis due to lipid rich plaque: Principal | ICD-10-CM

## 2014-09-11 DIAGNOSIS — D509 Iron deficiency anemia, unspecified: Secondary | ICD-10-CM

## 2014-09-11 DIAGNOSIS — I25119 Atherosclerotic heart disease of native coronary artery with unspecified angina pectoris: Secondary | ICD-10-CM | POA: Diagnosis not present

## 2014-09-11 DIAGNOSIS — K2100 Gastro-esophageal reflux disease with esophagitis, without bleeding: Secondary | ICD-10-CM

## 2014-09-11 DIAGNOSIS — K219 Gastro-esophageal reflux disease without esophagitis: Secondary | ICD-10-CM | POA: Insufficient documentation

## 2014-09-11 MED ORDER — PANTOPRAZOLE SODIUM 40 MG PO TBEC: 40.0000 mg | DELAYED_RELEASE_TABLET | Freq: Two times a day (BID) | ORAL | Status: DC

## 2014-09-11 MED ORDER — NITROGLYCERIN 0.4 MG/SPRAY TL SOLN: 1.0000 | Status: DC | PRN

## 2014-09-11 NOTE — Patient Instructions (Addendum)
Use sublingual nitroglycerine spray as needed for chest pain.  If pain is unrelieved despite 3 nitroglycerine applications call EMS or go directly to the nearest emergency room

## 2014-09-11 NOTE — Progress Notes (Signed)
[address]       [place] [address]             [phone]     CARDIOTHORACIC SURGERY CONSULTATION REPORT  Referring Provider is Yolonda Kida, MD PCP is [name], MD  Chief Complaint  Patient presents with  . Coronary Artery Disease    eval for CABG...CATH/ECHO     HPI:  Patient is a 69 year old female with known history of coronary artery disease, hypertension, hyperlipidemia, and a strong family history of coronary artery disease who has been referred for possible surgical treatment of severe multivessel coronary artery disease with unstable angina pectoris.  The patient's cardiac history dates back to 1999 when she suffered an acute inferior wall myocardial infarction just before her 52th birthday. She was hospitalized at [facility] where she underwent diagnostic cardiac catheterization followed by PCI with stenting of her right coronary artery. She recovered uneventfully and has done well until recently. She states that she was in her usual state of health until early January of this year when she developed new onset symptoms of exertional shortness of breath and chest tightness with severe fatigue. Symptoms progressed fairly rapidly over a relatively brief period of time, ultimately causing her to present to [facility]. She was found to be severely anemic at that time and transfused 2 units packed red blood cells. Details of that hospitalization are not currently available and it is unclear whether or not the patient ruled in for an acute non-ST segment elevation myocardial infarction.  Stool was reportedly Hemoccult negative at that time.  She was referred to a gastroenterologist who subsequently ordered an upper GI barium swallow and CT scan of the abdomen. She has been told that she had evidence of GE reflux disease with esophagitis. The patient was reportedly felt to be too high risk to undergo  upper GI endoscopy.The patient was additionally seen in follow-up by her cardiologist and diagnostic cardiac catheterization was recommended. Catheterization was performed 07/11/2014 and confirmed the presence of severe multivessel coronary artery disease with 100% chronic occlusion of the left anterior descending coronary artery. Left ventricular systolic function was mildly reduced. The patient's symptoms improved following transfusion at the time of her brief hospitalization in January, but she has continued to experience nearly daily episodes of substernal chest tightness and shortness of breath consistent with angina pectoralis that have been refractory to medical therapy. The patient has been referred for possible surgical revascularization.    The patient is married and lives with her husband in [place].  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 The patient and her husband care for their 56 year old great nephew whom they have legally adopted.  The patient does not exercise on a regular basis but she reports no specific physical limitations. Up until recently the patient has not experienced problems with chronic exertional shortness of breath or chest pain. Over the last few months she has continued to describe decreased energy, exertional shortness breath with chest tightness, and occasional palpitations. Symptoms of chest tightness and shortness of breath can occur with very mild activity and with stress, although for the most part symptoms are relieved with rest.  The patient denies any prolonged episodes of substernal chest tightness or shortness of breath that have lasted more than 5-10 minutes other than that which occurred prompting her hospitalization in early January. She denies any symptoms of PND, orthopnea, dizzy spells,  or syncope. She has occasional palpitations and intermittent mild bilateral lower extremity edema.  Recent complete blood count demonstrated stable  hemoglobin level of 9.7 and the patient has not had any symptoms to suggest active esophagitis, peptic ulcer disease, or GI bleeding.  She had a routine screening colonoscopy performed in August of 2014 that was reportedly normal.   Past Medical History  Diagnosis Date  . Hypertension   . Anxiety   . Hyperlipidemia   . Myocardial infarct, old     15 years ago  . Sleep apnea   . GERD (gastroesophageal reflux disease)   . Anemia   . Coronary artery disease     2x stents, Dr. Clayborn Bigness    Past Surgical History  Procedure Laterality Date  . Coronary stent placement      multiple, x 2  . Appendectomy    . Cholecystectomy    . Hernia repair Bilateral     x3  . Abdominal hysterectomy    . Bladder repair    . Cataracts      both eyes  . Carpal tunnel release      left  . Cardiac catheterization      [facility] x2 stent  . Back surgery      Family History  Problem Relation Age of Onset  . Heart disease Mother   . Stroke Mother   . Heart attack Mother   . Hypertension Mother   . COPD Father   . Heart disease Father   . Heart attack Father   . Glaucoma Father   . Osteoporosis Father   . Emphysema Father   . Cancer Sister 74    colon  . Hypertension Sister   . Stroke Brother   . Colon cancer Brother   . Hypertension Brother   . Esophageal cancer Neg Hx   . Stomach cancer Neg Hx   . Rectal cancer Neg Hx   . Stroke Maternal Grandmother   . Osteoporosis Maternal Grandmother   . Hypertension Maternal Grandmother   . Hypertension Paternal Grandmother   . Hypertension Paternal Grandfather     History   Social History  . Marital Status: Married    Spouse Name: N/A  . Number of Children: N/A  . Years of Education: N/A   Occupational History  . Not on file.   Social History Main Topics  . Smoking status: Never Smoker   . Smokeless tobacco: Never Used  . Alcohol Use: No     Comment: very rarely  . Drug Use: No  . Sexual Activity: Not on file   Other Topics  Concern  . Not on file   Social History Narrative   Lives in [place] with 87 YO nephew and husband. 3 dogs outside.      Work - Doctor, general practice, [employer]      Diet - regular, limited meat      Exercise - walks some    Current Outpatient Prescriptions  Medication Sig Dispense Refill  . amLODipine (NORVASC) 10 MG tablet TAKE 1 TABLET BY MOUTH ONCE DAILY. 90 tablet 1  . aspirin EC 81 MG tablet Take 81 mg by mouth daily.    . ferrous sulfate 325 (65 FE) MG tablet Take by mouth.    . isosorbide mononitrate (IMDUR) 60 MG 24 hr tablet Take 60 mg by mouth.    . metoprolol succinate (TOPROL-XL) 50 MG 24 hr tablet TAKE ONE TABLET BY MOUTH ONCE DAILY, WITH OR IMMEDIATELY FOLLOWING A MEAL.  90 tablet 1  . omeprazole (PRILOSEC OTC) 20 MG tablet Take 20 mg by mouth daily.    . ranolazine (RANEXA) 500 MG 12 hr tablet Take 500 mg by mouth 2 (two) times daily.    . rosuvastatin (CRESTOR) 20 MG tablet Take 40 mg by mouth daily. Take 2 tablets    . traMADol (ULTRAM) 50 MG tablet TAKE 1 OR 2 TABLETS BY MOUTH EVERY 8 HOURS AS NEEDED. (Patient taking differently: TAKE 1 OR 2 TABLETS BY MOUTH EVERY 8 HOURS AS NEEDED FOR PAIN) 180 tablet 1  . valsartan (DIOVAN) 320 MG tablet Take 320 mg by mouth daily.    Marland Kitchen venlafaxine XR (EFFEXOR-XR) 75 MG 24 hr capsule Take 75 mg by mouth daily with breakfast.     No current facility-administered medications for this visit.    Allergies  Allergen Reactions  . Lipitor [Atorvastatin]     myalgia      Review of Systems:   General:  decreased appetite, decreased energy, no weight gain, no weight loss, no fever  Cardiac:  + chest pain with exertion, no chest pain at rest, + SOB with exertion, no resting SOB, no PND, no orthopnea, occasional palpitations, no arrhythmia, no atrial fibrillation, intermittent mild LE edema, no dizzy spells, no syncope  Respiratory:  exertional shortness of breath, no home oxygen, no productive cough, no dry cough, no bronchitis,  no wheezing, no hemoptysis, no asthma, no pain with inspiration or cough, + sleep apnea, does not use CPAP at night  GI:   no difficulty swallowing, + reported h/o reflux, no frequent heartburn, + hiatal hernia, no abdominal pain, no constipation, no diarrhea, no hematochezia, no hematemesis, no melena  GU:   no dysuria,  no frequency, no urinary tract infection, no hematuria, no kidney stones, no kidney disease  Vascular:  no pain suggestive of claudication, no pain in feet, occasional nocturnal leg cramps, no varicose veins, no DVT, no non-healing foot ulcer  Neuro:   no stroke, no TIA's, no seizures, no headaches, no temporary blindness one eye,  no slurred speech, no peripheral neuropathy, no chronic pain, no instability of gait, no memory/cognitive dysfunction  Musculoskeletal: no arthritis, no joint swelling, no myalgias, no difficulty walking, normal mobility   Skin:   no rash, no itching, no skin infections, no pressure sores or ulcerations  Psych:   no anxiety, no depression, no nervousness, no unusual recent stress  Eyes:   no blurry vision, no floaters, no recent vision changes, + wears glasses or contacts  ENT:   no hearing loss, no loose or painful teeth, no dentures, last saw dentist within the past year  Hematologic:  + easy bruising, possible abnormal bleeding, no clotting disorder, no frequent epistaxis  Endocrine:  no diabetes, does not check CBG's at home     Physical Exam:   BP 142/88 mmHg  Pulse 80  Resp 16  Ht 5\' 3"  (1.6 m)  Wt 179 lb (81.194 kg)  BMI 31.72 kg/m2  SpO2 98%  General:  Mildly obese, o/w  well-appearing  HEENT:  Unremarkable   Neck:   no JVD, no bruits, no adenopathy   Chest:   clear to auscultation, symmetrical breath sounds, no wheezes, no rhonchi   CV:   RRR, no murmur   Abdomen:  soft, non-tender, no masses   Extremities:  warm, well-perfused, pulses palpable, no LE edema  Rectal/GU  Deferred  Neuro:   Grossly non-focal and symmetrical  throughout  Skin:   Clean  and dry, no rashes, no breakdown   Diagnostic Tests:  CARDIAC CATHETERIZATION  Both the report and images from diagnostic cardiac catheterization performed 07/11/2014 at [facility] are reviewed. The patient has diffuse multivessel coronary artery disease with mild left ventricular systolic dysfunction. There is 75% proximal stenosis of the left anterior descending coronary artery with 100% chronic occlusion of the left anterior descending coronary artery. There is 60% stenosis of a large obtuse marginal branch of the left circumflex system. There is right dominant coronary circulation. There is long segment diffuse stenosis within a long overlapping stent in the mid right coronary artery. Although the absolute caliber of stenosis is less than 50%, the amount of stenosis over the long distance within the stent may be flow limiting. There is 75% stenosis of the distal right coronary artery beyond the distal end of the stent. There are faint collaterals filling some terminal branches of the distal left anterior descending coronary artery territory. Whether or not there will be a reasonably good target vessel for grafting and the left anterior descending coronary artery territory remains uncertain. However, the distal anterior wall and apex appears viable based on only mild decrease in contractility noted on left ventriculogram.   Impression:  Patient has severe multivessel coronary artery disease including 100% chronic occlusion of the left anterior descending coronary artery and mild left ventricular systolic dysfunction. She is experiencing nearly daily symptoms of exertional chest tightness and shortness of breath consistent with unstable angina pectoris despite maximal medical therapy.  I have personally reviewed the patient's recent diagnostic cardiac catheterization films. I agree that she would probably benefit from surgical revascularization, although  it remains unclear whether or not she will have a very good target vessel for grafting in the left anterior descending coronary artery distribution.  Risks associated with surgery should be relatively low, although the reason for the patient's recent problems with severe anemia has never been completely explained.   Plan:  I have reviewed the indications, risks, and potential benefits of coronary artery bypass grafting with the patient in the office today.  Alternative treatment strategies have been discussed.  The patient understands and accepts all potential associated risks of surgery including but not limited to risk of death, stroke or other neurologic complication, myocardial infarction, congestive heart failure, respiratory failure, renal failure, bleeding requiring blood transfusion and/or reexploration, aortic dissection or other major vascular complication, arrhythmia, heart block or bradycardia requiring permanent pacemaker, pneumonia, pleural effusion, wound infection, pulmonary embolus or other thromboembolic complication, chronic pain or other delayed complications related to median sternotomy, or the late recurrence of symptomatic ischemic heart disease and/or congestive heart failure.  The importance of long term risk modification have been emphasized.  All questions answered.  We tentatively plan to proceed with surgery on Thursday, 09/27/2014.  We will try to obtain reports from the upper GI barium swallow and abdominal CT scan performed recently in [place].  I have given the patient a prescription for protonix to treat presumed GE reflux disease with esophagitis. I have also given the patient a prescription for sublingual nitroglycerin spray to use as needed.  The patient will return for follow-up on Monday, 09/24/2014 prior to surgery. All of her questions have been answered.   I spent in excess of 90 minutes during the conduct of this office consultation and >50% of this time involved  direct face-to-face encounter with the patient for counseling and/or coordination of their care.   Valentina Gu. Roxy Manns, MD 09/11/2014 12:13 PM

## 2014-09-11 NOTE — Progress Notes (Signed)
Subjective:    Patient ID: Debbie Delacruz, female    DOB: 06-Dec-1945, 69 y.o.   MRN: [id number]  HPI  69YO female presents for preop evaluation.  Plans to see Dr. Ricard Dillon today to discuss CABG. Followed by Dr. Clayborn Bigness. Having tightness in her chest and palpitations almost every day. Cardiac cath by Dr. Clayborn Bigness in 06/2014. Also had GI bleeding and was hospitalized in 06/2014 at [facility]. Presented with fatigue, no frank bleeding. 2 units of RPBC. Had a barium swallow and CT scan but source of bleeding not determined. Had colonoscopy last year which was normal. Recently had been using NSAIDS, suspected upper GI bleeding.  No previous adverse reactions to anesthesia. No recent illnesses.  Trying to follow a healthy diet. Unable to exercise.  Wt Readings from Last 3 Encounters:  09/11/14 179 lb (81.194 kg)  08/29/13 201 lb (91.173 kg)  03/28/13 206 lb (93.441 kg)     Past medical, surgical, family and social history per today's encounter.  Review of Systems  Constitutional: Positive for fatigue. Negative for fever, chills, appetite change and unexpected weight change.  Eyes: Negative for visual disturbance.  Respiratory: Positive for shortness of breath.   Cardiovascular: Positive for chest pain and palpitations. Negative for leg swelling.  Gastrointestinal: Negative for nausea, vomiting, abdominal pain, diarrhea, constipation and blood in stool.  Skin: Negative for color change and rash.  Hematological: Negative for adenopathy. Does not bruise/bleed easily.  Psychiatric/Behavioral: Negative for dysphoric mood. The patient is not nervous/anxious.        Objective:    BP 158/83 mmHg  Pulse 74  Temp(Src) 98.2 F (36.8 C) (Oral)  Ht 5' 2.25" (1.581 m)  Wt 179 lb (81.194 kg)  BMI 32.48 kg/m2  SpO2 98% Physical Exam  Constitutional: She is oriented to person, place, and time. She appears well-developed and well-nourished. No distress.  HENT:  Head: Normocephalic and  atraumatic.  Right Ear: External ear normal.  Left Ear: External ear normal.  Nose: Nose normal.  Mouth/Throat: Oropharynx is clear and moist. No oropharyngeal exudate.  Eyes: Conjunctivae are normal. Pupils are equal, round, and reactive to light. Right eye exhibits no discharge. Left eye exhibits no discharge. No scleral icterus.  Neck: Normal range of motion. Neck supple. No tracheal deviation present. No thyromegaly present.  Cardiovascular: Normal rate, regular rhythm, normal heart sounds and intact distal pulses.  Exam reveals no gallop and no friction rub.   No murmur heard. Pulmonary/Chest: Effort normal and breath sounds normal. No respiratory distress. She has no wheezes. She has no rales. She exhibits no tenderness.  Musculoskeletal: Normal range of motion. She exhibits no edema or tenderness.  Lymphadenopathy:    She has no cervical adenopathy.  Neurological: She is alert and oriented to person, place, and time. No cranial nerve deficit. She exhibits normal muscle tone. Coordination normal.  Skin: Skin is warm and dry. No rash noted. She is not diaphoretic. No erythema. No pallor.  Psychiatric: She has a normal mood and affect. Her behavior is normal. Judgment and thought content normal.          Assessment & Plan:   Problem List Items Addressed This Visit      Unprioritized   Anemia    Recent microcytic anemia c/w iron deficiency. She has stopped NSAIDS and started Omeprazole. Recent Hgb 9.7. Reviewed notes from GI. Will request recent CT abdomen.       Relevant Medications   ferrous sulfate 325 (65 FE) MG tablet  Coronary artery disease - Primary    Pt with unstable angina. Reviewed cardiology notes. Recommend proceed with surgical evaluation with Dr. Ricard Dillon. Continue Imdur, Aspirin, Metoprolol, Crestor. Follow up here after surgical evaluation.      Relevant Medications   isosorbide mononitrate (IMDUR) 60 MG 24 hr tablet       Return in about 3 months (around  12/11/2014) for Recheck.

## 2014-09-11 NOTE — Assessment & Plan Note (Signed)
Pt with unstable angina. Reviewed cardiology notes. Recommend proceed with surgical evaluation with Dr. Ricard Dillon. Continue Imdur, Aspirin, Metoprolol, Crestor. Follow up here after surgical evaluation.

## 2014-09-11 NOTE — Patient Instructions (Signed)
Follow up with Dr. Ricard Dillon as scheduled.

## 2014-09-11 NOTE — Assessment & Plan Note (Signed)
Recent microcytic anemia c/w iron deficiency. She has stopped NSAIDS and started Omeprazole. Recent Hgb 9.7. Reviewed notes from GI. Will request recent CT abdomen.

## 2014-09-11 NOTE — Progress Notes (Signed)
Pre visit review using our clinic review tool, if applicable. No additional management support is needed unless otherwise documented below in the visit note. 

## 2014-09-13 ENCOUNTER — Encounter (HOSPITAL_COMMUNITY): Payer: Self-pay | Admitting: Emergency Medicine

## 2014-09-13 ENCOUNTER — Emergency Department (HOSPITAL_COMMUNITY): Payer: Medicare Other

## 2014-09-13 ENCOUNTER — Inpatient Hospital Stay (HOSPITAL_COMMUNITY)
Admission: EM | Admit: 2014-09-13 | Discharge: 2014-09-23 | DRG: 236 | Disposition: A | Discharge status: Home / Self Care | Payer: Medicare Other | Attending: Thoracic Surgery (Cardiothoracic Vascular Surgery) | Admitting: Thoracic Surgery (Cardiothoracic Vascular Surgery)

## 2014-09-13 DIAGNOSIS — T1490XA Injury, unspecified, initial encounter: Secondary | ICD-10-CM

## 2014-09-13 DIAGNOSIS — F419 Anxiety disorder, unspecified: Secondary | ICD-10-CM | POA: Diagnosis present

## 2014-09-13 DIAGNOSIS — I25110 Atherosclerotic heart disease of native coronary artery with unstable angina pectoris: Secondary | ICD-10-CM | POA: Diagnosis not present

## 2014-09-13 DIAGNOSIS — D509 Iron deficiency anemia, unspecified: Secondary | ICD-10-CM | POA: Diagnosis present

## 2014-09-13 DIAGNOSIS — I10 Essential (primary) hypertension: Secondary | ICD-10-CM | POA: Diagnosis present

## 2014-09-13 DIAGNOSIS — Z8719 Personal history of other diseases of the digestive system: Secondary | ICD-10-CM | POA: Diagnosis present

## 2014-09-13 DIAGNOSIS — R55 Syncope and collapse: Secondary | ICD-10-CM | POA: Diagnosis present

## 2014-09-13 DIAGNOSIS — I25119 Atherosclerotic heart disease of native coronary artery with unspecified angina pectoris: Secondary | ICD-10-CM

## 2014-09-13 DIAGNOSIS — E785 Hyperlipidemia, unspecified: Secondary | ICD-10-CM | POA: Diagnosis not present

## 2014-09-13 DIAGNOSIS — D62 Acute posthemorrhagic anemia: Secondary | ICD-10-CM | POA: Diagnosis not present

## 2014-09-13 DIAGNOSIS — I200 Unstable angina: Secondary | ICD-10-CM | POA: Diagnosis present

## 2014-09-13 DIAGNOSIS — Z955 Presence of coronary angioplasty implant and graft: Secondary | ICD-10-CM

## 2014-09-13 DIAGNOSIS — G4733 Obstructive sleep apnea (adult) (pediatric): Secondary | ICD-10-CM | POA: Diagnosis present

## 2014-09-13 DIAGNOSIS — J9811 Atelectasis: Secondary | ICD-10-CM

## 2014-09-13 DIAGNOSIS — I2510 Atherosclerotic heart disease of native coronary artery without angina pectoris: Secondary | ICD-10-CM

## 2014-09-13 DIAGNOSIS — Z7982 Long term (current) use of aspirin: Secondary | ICD-10-CM

## 2014-09-13 DIAGNOSIS — Z825 Family history of asthma and other chronic lower respiratory diseases: Secondary | ICD-10-CM

## 2014-09-13 DIAGNOSIS — M25462 Effusion, left knee: Secondary | ICD-10-CM | POA: Diagnosis not present

## 2014-09-13 DIAGNOSIS — M25562 Pain in left knee: Secondary | ICD-10-CM

## 2014-09-13 DIAGNOSIS — K2980 Duodenitis without bleeding: Secondary | ICD-10-CM | POA: Diagnosis present

## 2014-09-13 DIAGNOSIS — I252 Old myocardial infarction: Secondary | ICD-10-CM

## 2014-09-13 DIAGNOSIS — K2270 Barrett's esophagus without dysplasia: Secondary | ICD-10-CM | POA: Diagnosis present

## 2014-09-13 DIAGNOSIS — K210 Gastro-esophageal reflux disease with esophagitis: Secondary | ICD-10-CM | POA: Diagnosis present

## 2014-09-13 DIAGNOSIS — E8770 Fluid overload, unspecified: Secondary | ICD-10-CM | POA: Diagnosis not present

## 2014-09-13 DIAGNOSIS — Z79899 Other long term (current) drug therapy: Secondary | ICD-10-CM

## 2014-09-13 DIAGNOSIS — Z8249 Family history of ischemic heart disease and other diseases of the circulatory system: Secondary | ICD-10-CM

## 2014-09-13 DIAGNOSIS — Z951 Presence of aortocoronary bypass graft: Secondary | ICD-10-CM

## 2014-09-13 HISTORY — DX: Angina pectoris, unspecified: I20.9

## 2014-09-13 HISTORY — DX: Presence of aortocoronary bypass graft: Z95.1

## 2014-09-13 HISTORY — DX: Headache, unspecified: R51.9

## 2014-09-13 HISTORY — DX: Family history of other specified conditions: Z84.89

## 2014-09-13 HISTORY — DX: Headache: R51

## 2014-09-13 HISTORY — DX: Other specified postprocedural states: Z98.890

## 2014-09-13 HISTORY — DX: Personal history of other medical treatment: Z92.89

## 2014-09-13 HISTORY — DX: Nausea with vomiting, unspecified: R11.2

## 2014-09-13 HISTORY — DX: Personal history of other diseases of the digestive system: Z87.19

## 2014-09-13 LAB — CBC WITH DIFFERENTIAL/PLATELET
BASOS ABS: 0 10*3/uL (ref 0.0–0.1)
Basophils Relative: 0 % (ref 0–1)
Eosinophils Absolute: 0.1 10*3/uL (ref 0.0–0.7)
Eosinophils Relative: 1 % (ref 0–5)
HCT: 29 % — ABNORMAL LOW (ref 36.0–46.0)
Hemoglobin: 8.3 g/dL — ABNORMAL LOW (ref 12.0–15.0)
Lymphocytes Relative: 10 % — ABNORMAL LOW (ref 12–46)
Lymphs Abs: 0.9 10*3/uL (ref 0.7–4.0)
MCH: 19.4 pg — AB (ref 26.0–34.0)
MCHC: 28.6 g/dL — ABNORMAL LOW (ref 30.0–36.0)
MCV: 67.8 fL — ABNORMAL LOW (ref 78.0–100.0)
MONO ABS: 0.6 10*3/uL (ref 0.1–1.0)
Monocytes Relative: 6 % (ref 3–12)
NEUTROS PCT: 83 % — AB (ref 43–77)
Neutro Abs: 7.8 10*3/uL — ABNORMAL HIGH (ref 1.7–7.7)
PLATELETS: 263 10*3/uL (ref 150–400)
RBC: 4.28 MIL/uL (ref 3.87–5.11)
RDW: 17.5 % — AB (ref 11.5–15.5)
WBC: 9.4 10*3/uL (ref 4.0–10.5)

## 2014-09-13 LAB — BASIC METABOLIC PANEL
Anion gap: 10 (ref 5–15)
BUN: 19 mg/dL (ref 6–23)
CO2: 23 mmol/L (ref 19–32)
Calcium: 9 mg/dL (ref 8.4–10.5)
Chloride: 105 mmol/L (ref 96–112)
Creatinine, Ser: 0.86 mg/dL (ref 0.50–1.10)
GFR calc Af Amer: 80 mL/min — ABNORMAL LOW (ref >90)
GFR calc non Af Amer: 69 mL/min — ABNORMAL LOW (ref >90)
Glucose, Bld: 119 mg/dL — ABNORMAL HIGH (ref 70–99)
POTASSIUM: 4.1 mmol/L (ref 3.5–5.1)
SODIUM: 138 mmol/L (ref 135–145)

## 2014-09-13 LAB — TROPONIN I: Troponin I: <0.03 ng/mL (ref <0.031)

## 2014-09-13 LAB — RETICULOCYTES
RBC.: 4.19 MIL/uL (ref 3.87–5.11)
RETIC CT PCT: 1.1 % (ref 0.4–3.1)
Retic Count, Absolute: 46.1 10*3/uL (ref 19.0–186.0)

## 2014-09-13 LAB — CBG MONITORING, ED: GLUCOSE-CAPILLARY: 122 mg/dL — AB (ref 70–99)

## 2014-09-13 LAB — I-STAT TROPONIN, ED: Troponin i, poc: 0.01 ng/mL (ref 0.00–0.08)

## 2014-09-13 MED ORDER — NITROGLYCERIN IN D5W 200-5 MCG/ML-% IV SOLN
0.0000 ug/min | Freq: Once | INTRAVENOUS | Status: AC
  Administered 2014-09-13: 10 ug/min via INTRAVENOUS

## 2014-09-13 MED ORDER — PANTOPRAZOLE SODIUM 40 MG PO TBEC
40.0000 mg | DELAYED_RELEASE_TABLET | Freq: Two times a day (BID) | ORAL | Status: DC
  Administered 2014-09-13 – 2014-09-16 (×6): 40 mg via ORAL
  Filled 2014-09-13 (×6): qty 1

## 2014-09-13 MED ORDER — METOPROLOL SUCCINATE ER 50 MG PO TB24
50.0000 mg | ORAL_TABLET | Freq: Every day | ORAL | Status: DC
  Administered 2014-09-14: 50 mg via ORAL
  Filled 2014-09-13: qty 1

## 2014-09-13 MED ORDER — ONDANSETRON HCL 4 MG/2ML IJ SOLN
4.0000 mg | Freq: Once | INTRAMUSCULAR | Status: AC
  Administered 2014-09-13: 4 mg via INTRAVENOUS
  Filled 2014-09-13: qty 2

## 2014-09-13 MED ORDER — ROSUVASTATIN CALCIUM 40 MG PO TABS
40.0000 mg | ORAL_TABLET | Freq: Every day | ORAL | Status: DC
  Administered 2014-09-14 – 2014-09-22 (×8): 40 mg via ORAL
  Filled 2014-09-13 (×3): qty 1
  Filled 2014-09-13: qty 4
  Filled 2014-09-13 (×3): qty 1
  Filled 2014-09-13 (×3): qty 4

## 2014-09-13 MED ORDER — ENOXAPARIN SODIUM 40 MG/0.4ML ~~LOC~~ SOLN
40.0000 mg | SUBCUTANEOUS | Status: DC
  Administered 2014-09-13 – 2014-09-15 (×3): 40 mg via SUBCUTANEOUS
  Filled 2014-09-13 (×3): qty 0.4

## 2014-09-13 MED ORDER — FLUOXETINE HCL 20 MG PO CAPS
40.0000 mg | ORAL_CAPSULE | Freq: Every day | ORAL | Status: DC
  Administered 2014-09-14 – 2014-09-23 (×9): 40 mg via ORAL
  Filled 2014-09-13 (×9): qty 2

## 2014-09-13 MED ORDER — NITROGLYCERIN IN D5W 200-5 MCG/ML-% IV SOLN
0.0000 ug/min | Freq: Once | INTRAVENOUS | Status: AC
  Administered 2014-09-13: 5 ug/min via INTRAVENOUS
  Filled 2014-09-13: qty 250

## 2014-09-13 MED ORDER — ASPIRIN EC 81 MG PO TBEC
81.0000 mg | DELAYED_RELEASE_TABLET | Freq: Every day | ORAL | Status: DC
  Administered 2014-09-14 – 2014-09-17 (×4): 81 mg via ORAL
  Filled 2014-09-13 (×4): qty 1

## 2014-09-13 MED ORDER — ISOSORBIDE MONONITRATE ER 60 MG PO TB24: 120.0000 mg | ORAL_TABLET | Freq: Every day | ORAL | Status: DC

## 2014-09-13 MED ORDER — SODIUM CHLORIDE 0.9 % IV SOLN
INTRAVENOUS | Status: DC
  Administered 2014-09-13 – 2014-09-14 (×2): via INTRAVENOUS

## 2014-09-13 MED ORDER — ACETAMINOPHEN 500 MG PO TABS
500.0000 mg | ORAL_TABLET | Freq: Four times a day (QID) | ORAL | Status: DC | PRN
  Administered 2014-09-17 (×3): 500 mg via ORAL
  Filled 2014-09-13 (×3): qty 1

## 2014-09-13 NOTE — ED Notes (Signed)
APPOINTMENT TIME FOR 3 WEST- 5:55PM

## 2014-09-13 NOTE — ED Provider Notes (Signed)
CSN: [id number]     Arrival date & time 09/13/14  1316 History   First MD Initiated Contact with Patient 09/13/14 1325     Chief Complaint  Patient presents with  . Chest Pain  . Loss of Consciousness     (Consider location/radiation/quality/duration/timing/severity/associated sxs/prior Treatment) HPI Patient states she felt nauseated at work immediately prior to coming here she stood up and suffered a syncopal event, she reports that her boss states he was unconscious for approximately 2 minutes. Upon regaining consciousness patient had anterior chest pain typical of the chest pain she gets daily chest pain is presently  minimal. The chest pain she gets daily is worse with exertion or stress. She denies any shortness of breath no longer nauseated. Patient is scheduled for CABG 09/27/2014. EMS treated patient with aspirin and started peripheral IV of normal saline.. Her topical nitrate was recently increased in dosage by her cardiologist, Dr.Collwood at [facility] clinic Past Medical History  Diagnosis Date  . Hypertension   . Anxiety   . Hyperlipidemia   . Myocardial infarct, old     15 years ago  . Sleep apnea   . GERD (gastroesophageal reflux disease)   . Anemia   . Coronary artery disease     2x stents, Dr. Clayborn Bigness   Past Surgical History  Procedure Laterality Date  . Coronary stent placement      multiple, x 2  . Appendectomy    . Cholecystectomy    . Hernia repair Bilateral     x3  . Abdominal hysterectomy    . Bladder repair    . Cataracts      both eyes  . Carpal tunnel release      left  . Cardiac catheterization      [facility] x2 stent  . Back surgery     Family History  Problem Relation Age of Onset  . Heart disease Mother   . Stroke Mother   . Heart attack Mother   . Hypertension Mother   . COPD Father   . Heart disease Father   . Heart attack Father   . Glaucoma Father   . Osteoporosis Father   . Emphysema Father   . Cancer Sister 103    colon  .  Hypertension Sister   . Stroke Brother   . Colon cancer Brother   . Hypertension Brother   . Esophageal cancer Neg Hx   . Stomach cancer Neg Hx   . Rectal cancer Neg Hx   . Stroke Maternal Grandmother   . Osteoporosis Maternal Grandmother   . Hypertension Maternal Grandmother   . Hypertension Paternal Grandmother   . Hypertension Paternal Grandfather    History  Substance Use Topics  . Smoking status: Never Smoker   . Smokeless tobacco: Never Used  . Alcohol Use: No     Comment: very rarely   OB History    No data available     Review of Systems  Constitutional: Negative.   HENT: Negative.   Respiratory: Negative.   Cardiovascular: Positive for chest pain.       Syncope  Gastrointestinal: Negative.   Musculoskeletal: Negative.   Skin: Negative.   Neurological: Negative.   Psychiatric/Behavioral: Negative.   All other systems reviewed and are negative.     Allergies  Lipitor  Home Medications   Prior to Admission medications   Medication Sig Start Date End Date Taking? Authorizing Provider  nitroGLYCERIN (NITROLINGUAL) 0.4 MG/SPRAY spray Place 1 spray under the tongue  every 5 (five) minutes x 3 doses as needed for chest pain. 09/11/14  Yes [name], MD  amLODipine (NORVASC) 10 MG tablet TAKE 1 TABLET BY MOUTH ONCE DAILY. 04/02/14   [name], MD  aspirin EC 81 MG tablet Take 81 mg by mouth daily.    Historical Provider, MD  ferrous sulfate 325 (65 FE) MG tablet Take by mouth.    Historical Provider, MD  isosorbide mononitrate (IMDUR) 60 MG 24 hr tablet Take 60 mg by mouth. 08/28/14 08/28/15  Historical Provider, MD  metoprolol succinate (TOPROL-XL) 50 MG 24 hr tablet TAKE ONE TABLET BY MOUTH ONCE DAILY, WITH OR IMMEDIATELY FOLLOWING A MEAL. 04/02/14   [name], MD  omeprazole (PRILOSEC OTC) 20 MG tablet Take 20 mg by mouth daily.    Historical Provider, MD  pantoprazole (PROTONIX) 40 MG tablet Take 1 tablet (40 mg total) by mouth 2 (two) times  daily. 09/11/14   [name], MD  ranolazine (RANEXA) 500 MG 12 hr tablet Take 500 mg by mouth 2 (two) times daily.    Historical Provider, MD  rosuvastatin (CRESTOR) 20 MG tablet Take 40 mg by mouth daily. Take 2 tablets    Historical Provider, MD  traMADol (ULTRAM) 50 MG tablet TAKE 1 OR 2 TABLETS BY MOUTH EVERY 8 HOURS AS NEEDED. Patient taking differently: TAKE 1 OR 2 TABLETS BY MOUTH EVERY 8 HOURS AS NEEDED FOR PAIN 04/02/14   [name], MD  valsartan (DIOVAN) 320 MG tablet Take 320 mg by mouth daily.    Historical Provider, MD  venlafaxine XR (EFFEXOR-XR) 75 MG 24 hr capsule Take 75 mg by mouth daily with breakfast.    Historical Provider, MD   BP 139/72 mmHg  Temp(Src) 97.6 F (36.4 C) (Oral)  Resp 15  Ht 5\' 2"  (1.575 m)  Wt 179 lb (81.194 kg)  BMI 32.73 kg/m2  SpO2 100% Physical Exam  Constitutional: She appears well-developed and well-nourished.  HENT:  Head: Normocephalic and atraumatic.  Eyes: Conjunctivae are normal. Pupils are equal, round, and reactive to light.  Neck: Neck supple. No tracheal deviation present. No thyromegaly present.  Cardiovascular: Normal rate and regular rhythm.   No murmur heard. Pulmonary/Chest: Effort normal and breath sounds normal.  Abdominal: Soft. Bowel sounds are normal. She exhibits no distension. There is no tenderness.  Musculoskeletal: Normal range of motion. She exhibits no edema or tenderness.  Neurological: She is alert. Coordination normal.  Skin: Skin is warm and dry. No rash noted.  Psychiatric: She has a normal mood and affect.  Nursing note and vitals reviewed.   ED Course  Procedures (including critical care time) Labs Review Labs Reviewed  CBG MONITORING, ED - Abnormal; Notable for the following:    Glucose-Capillary 122 (*)    All other components within normal limits  BASIC METABOLIC PANEL  CBC  I-STAT TROPOININ, ED    Imaging Review No results found.   EKG Interpretation   Date/Time:  Thursday  September 13 2014 13:22:15 EDT Ventricular Rate:  79 PR Interval:  109 QRS Duration: 92 QT Interval:  412 QTC Calculation: 472 R Axis:   37 Text Interpretation:  Sinus rhythm Short PR interval No significant change  since last tracing Confirmed by Niana Martorana  MD, Hammond Obeirne [phone]) on 09/13/2014  1:36:45 PM     4:20 PM patient asymptomatic pain-free after treatment with intravenous nitroglycerin drip at 15 mcg/m. Chest x-ray viewed by me Results for orders placed or performed during the  hospital encounter of 67/89/38  Basic metabolic panel  Result Value Ref Range   Sodium 138 135 - 145 mmol/L   Potassium 4.1 3.5 - 5.1 mmol/L   Chloride 105 96 - 112 mmol/L   CO2 23 19 - 32 mmol/L   Glucose, Bld 119 (H) 70 - 99 mg/dL   BUN 19 6 - 23 mg/dL   Creatinine, Ser 0.86 0.50 - 1.10 mg/dL   Calcium 9.0 8.4 - 10.5 mg/dL   GFR calc non Af Amer 69 (L) >90 mL/min   GFR calc Af Amer 80 (L) >90 mL/min   Anion gap 10 5 - 15  I-stat troponin, ED (not at [facility])  Result Value Ref Range   Troponin i, poc 0.01 0.00 - 0.08 ng/mL   Comment 3          CBG, ED  Result Value Ref Range   Glucose-Capillary 122 (H) 70 - 99 mg/dL   Dg Chest Port 1 View  09/13/2014   CLINICAL DATA:  Chest pain.  Loss of consciousness.  EXAM: PORTABLE CHEST - 1 VIEW  COMPARISON:  August 29, 2012.  FINDINGS: The heart size and mediastinal contours are within normal limits. Both lungs are clear. No pneumothorax or pleural effusion is noted. The visualized skeletal structures are unremarkable.  IMPRESSION: No acute cardiopulmonary abnormality seen   Electronically Signed   By: Marijo Conception, M.D.   On: 09/13/2014 14:56   Results for orders placed or performed during the hospital encounter of 03/10/50  Basic metabolic panel  Result Value Ref Range   Sodium 138 135 - 145 mmol/L   Potassium 4.1 3.5 - 5.1 mmol/L   Chloride 105 96 - 112 mmol/L   CO2 23 19 - 32 mmol/L   Glucose, Bld 119 (H) 70 - 99 mg/dL   BUN 19 6 - 23 mg/dL   Creatinine,  Ser 0.86 0.50 - 1.10 mg/dL   Calcium 9.0 8.4 - 10.5 mg/dL   GFR calc non Af Amer 69 (L) >90 mL/min   GFR calc Af Amer 80 (L) >90 mL/min   Anion gap 10 5 - 15  I-stat troponin, ED (not at [facility])  Result Value Ref Range   Troponin i, poc 0.01 0.00 - 0.08 ng/mL   Comment 3          CBG, ED  Result Value Ref Range   Glucose-Capillary 122 (H) 70 - 99 mg/dL   Dg Chest Port 1 View  09/13/2014   CLINICAL DATA:  Chest pain.  Loss of consciousness.  EXAM: PORTABLE CHEST - 1 VIEW  COMPARISON:  August 29, 2012.  FINDINGS: The heart size and mediastinal contours are within normal limits. Both lungs are clear. No pneumothorax or pleural effusion is noted. The visualized skeletal structures are unremarkable.  IMPRESSION: No acute cardiopulmonary abnormality seen   Electronically Signed   By: Marijo Conception, M.D.   On: 09/13/2014 14:56    MDM  Spoke with Dr.Akula plan admit telemetry, intravenous nitroglycerin drip. Cardiology consult.Spoke with Dr. Orene Desanctis , cardiology service, who will consult Diagnosis #1 unstable angina #2 syncope Final diagnoses:  None  CRITICAL CARE Performed by: Orlie Dakin Total critical care time: 30 minutes Critical care time was exclusive of separately billable procedures and treating other patients. Critical care was necessary to treat or prevent imminent or life-threatening deterioration. Critical care was time spent personally by me on the following activities: development of treatment plan with patient and/or surrogate as well as nursing, discussions  with consultants, evaluation of patient's response to treatment, examination of patient, obtaining history from patient or surrogate, ordering and performing treatments and interventions, ordering and review of laboratory studies, ordering and review of radiographic studies, pulse oximetry and re-evaluation of patient's condition.      Orlie Dakin, MD 09/13/14 [phone]

## 2014-09-13 NOTE — H&P (Signed)
[facility] History and Physical  Kimberli Winne FMB:[id number] DOB: Feb 03, 1946 DOA: 09/13/2014  Referring physician: EDP PCP: [name], MD   Chief Complaint: syncope earlier today with chest pain and nausea.   HPI: Debbie Delacruz is a 69 y.o. female with h/o hypertension, hyperlipidemia, CAD, comes inf or an episode of syncope at work. As per the patient, she was feeling nausea and stood up to go to the bathroom and the next thing she remembers is being on the floor. Her boss was beside her and had told her that she passed out for about 2 minutes. She also reports associated chest pain with some palpitations. She had a prior episode of syncope in January and she was admitted to the hospital and was found to be anemic. She reports her stools here heme occult negative at that time. She also underwent colonoscopy by Dr stark and was essentially normal . She never had an upper endoscopy done. At this time her chest pain has resolved with nitro drip. She denies any other complaints. She was referred to medical service for admission and cardiology consulted for recommendations. She was seen by Dr Ricard Dillon and planned for CABG for may 5th 2016.   Review of Systems:  Constitutional:  No weight loss, night sweats, Fevers, chills, fatigue.  HEENT:  No headaches, Difficulty swallowing,Tooth/dental problems,Sore throat,  No sneezing, itching, ear ache, nasal congestion, post nasal drip,  Cardio-vascular:  Chest pain, and passed out with some nausea.   GI:  No heartburn, indigestion, abdominal pain,  vomiting, diarrhea, change in bowel habits, loss of appetite  Resp:  No shortness of breath with exertion or at rest. No excess mucus, no productive cough, No non-productive cough, No coughing up of blood.No change in color of mucus.No wheezing.No chest wall deformity  Skin:  no rash or lesions.  GU:  no dysuria, change in color of urine, no urgency or frequency. No flank  pain.  Musculoskeletal:  No joint pain or swelling. No decreased range of motion. No back pain.  Psych:  No change in mood or affect. No depression or anxiety. No memory loss.   Past Medical History  Diagnosis Date  . Hypertension   . Anxiety   . Hyperlipidemia   . Myocardial infarct, old     15 years ago  . Sleep apnea   . GERD (gastroesophageal reflux disease)   . Anemia   . Coronary artery disease     2x stents, Dr. Clayborn Bigness   Past Surgical History  Procedure Laterality Date  . Coronary stent placement      multiple, x 2  . Appendectomy    . Cholecystectomy    . Hernia repair Bilateral     x3  . Abdominal hysterectomy    . Bladder repair    . Cataracts      both eyes  . Carpal tunnel release      left  . Cardiac catheterization      [facility] x2 stent  . Back surgery     Social History:  reports that she has never smoked. She has never used smokeless tobacco. She reports that she does not drink alcohol or use illicit drugs.  Allergies  Allergen Reactions  . Lipitor [Atorvastatin]     myalgia    Family History  Problem Relation Age of Onset  . Heart disease Mother   . Stroke Mother   . Heart attack Mother   . Hypertension Mother   . COPD Father   .  Heart disease Father   . Heart attack Father   . Glaucoma Father   . Osteoporosis Father   . Emphysema Father   . Cancer Sister 61    colon  . Hypertension Sister   . Stroke Brother   . Colon cancer Brother   . Hypertension Brother   . Esophageal cancer Neg Hx   . Stomach cancer Neg Hx   . Rectal cancer Neg Hx   . Stroke Maternal Grandmother   . Osteoporosis Maternal Grandmother   . Hypertension Maternal Grandmother   . Hypertension Paternal Grandmother   . Hypertension Paternal Grandfather     Prior to Admission medications   Medication Sig Start Date End Date Taking? Authorizing Provider  acetaminophen (TYLENOL) 500 MG tablet Take 500 mg by mouth every 6 (six) hours as needed for headache.   Yes  Historical Provider, MD  amLODipine (NORVASC) 10 MG tablet TAKE 1 TABLET BY MOUTH ONCE DAILY. 04/02/14  Yes [name], MD  aspirin EC 81 MG tablet Take 81 mg by mouth daily.   Yes Historical Provider, MD  ferrous sulfate 325 (65 FE) MG tablet Take 325 mg by mouth daily with breakfast.    Yes Historical Provider, MD  FLUoxetine (PROZAC) 20 MG capsule Take 40 mg by mouth daily.   Yes Historical Provider, MD  isosorbide mononitrate (IMDUR) 60 MG 24 hr tablet Take 120 mg by mouth daily.  08/28/14 08/28/15 Yes Historical Provider, MD  metoprolol succinate (TOPROL-XL) 50 MG 24 hr tablet TAKE ONE TABLET BY MOUTH ONCE DAILY, WITH OR IMMEDIATELY FOLLOWING A MEAL. 04/02/14  Yes [name], MD  nitroGLYCERIN (NITROLINGUAL) 0.4 MG/SPRAY spray Place 1 spray under the tongue every 5 (five) minutes x 3 doses as needed for chest pain. 09/11/14  Yes [name], MD  omeprazole (PRILOSEC OTC) 20 MG tablet Take 20 mg by mouth daily.   Yes Historical Provider, MD  pantoprazole (PROTONIX) 40 MG tablet Take 1 tablet (40 mg total) by mouth 2 (two) times daily. 09/11/14  Yes [name], MD  rosuvastatin (CRESTOR) 20 MG tablet Take 40 mg by mouth daily.    Yes Historical Provider, MD  traMADol (ULTRAM) 50 MG tablet TAKE 1 OR 2 TABLETS BY MOUTH EVERY 8 HOURS AS NEEDED. Patient taking differently: TAKE 1 OR 2 TABLETS BY MOUTH EVERY 8 HOURS AS NEEDED FOR PAIN 04/02/14  Yes [name], MD   Physical Exam: Filed Vitals:   09/13/14 1630 09/13/14 1640 09/13/14 1712 09/13/14 1720  BP: 127/63 119/59 114/65 123/71  Pulse: 84 80 90 85  Temp:      TempSrc:      Resp: 15 17 22 16   Height:      Weight:      SpO2: 99% 98% 95% 97%    Wt Readings from Last 3 Encounters:  09/13/14 81.194 kg (179 lb)  09/11/14 81.194 kg (179 lb)  09/11/14 81.194 kg (179 lb)    General:  Appears calm and comfortable Eyes: PERRL, normal lids, irises & conjunctiva Neck: no LAD, masses or thyromegaly Cardiovascular: RRR,  no m/r/g. No LE edema. Respiratory: CTA bilaterally, no w/r/r. Normal respiratory effort. Abdomen: soft, ntnd Skin: no rash or induration seen on limited exam Musculoskeletal: grossly normal tone BUE/BLE Psychiatric: grossly normal mood and affect, speech fluent and appropriate Neurologic: grossly non-focal.          Labs on Admission:  Basic Metabolic Panel:  Recent Labs Lab 09/13/14 1328  NA 138  K 4.1  CL 105  CO2 23  GLUCOSE 119*  BUN 19  CREATININE 0.86  CALCIUM 9.0   Liver Function Tests: No results for input(s): AST, ALT, ALKPHOS, BILITOT, PROT, ALBUMIN in the last 168 hours. No results for input(s): LIPASE, AMYLASE in the last 168 hours. No results for input(s): AMMONIA in the last 168 hours. CBC:  Recent Labs Lab 09/13/14 1500  WBC 9.4  NEUTROABS PENDING  HGB 8.3*  HCT 29.0*  MCV 67.8*  PLT 263   Cardiac Enzymes: No results for input(s): CKTOTAL, CKMB, CKMBINDEX, TROPONINI in the last 168 hours.  BNP (last 3 results) No results for input(s): BNP in the last 8760 hours.  ProBNP (last 3 results) No results for input(s): PROBNP in the last 8760 hours.  CBG:  Recent Labs Lab 09/13/14 1343  GLUCAP 122*    Radiological Exams on Admission: Dg Chest Port 1 View  09/13/2014   CLINICAL DATA:  Chest pain.  Loss of consciousness.  EXAM: PORTABLE CHEST - 1 VIEW  COMPARISON:  August 29, 2012.  FINDINGS: The heart size and mediastinal contours are within normal limits. Both lungs are clear. No pneumothorax or pleural effusion is noted. The visualized skeletal structures are unremarkable.  IMPRESSION: No acute cardiopulmonary abnormality seen   Electronically Signed   By: Marijo Conception, M.D.   On: 09/13/2014 14:56    EKG: reviewed sinus rhythm,  Assessment/Plan Active Problems:   Unstable angina   Syncope  Syncope: probably vaso vagal . Orthostatics and hydration.  Admit to telemetry and cycle cardiac enzymes and get echocardiogram.    Unstable  angina: relieved with nitro gtt.  Resume aspirin. Initial troponin negative. Cycle enzymes. Ekg does not show any ischemic changes.  Echocardiogram ordered and cardiology requested by EDP to see the patient.  CXR does not show any pulmonary edema.   Anemia: Hypochromic, anemia panel sent, and stool for occult blood ordered.    Hypertension; well controlled.    Hyperlipidemia:  Lipid panel in am.      Code Status: full code.  DVT Prophylaxis: Family Communication:family at bedside Disposition Plan: admit to telem  Time spent: 55 min  [facility] Pager [phone]

## 2014-09-13 NOTE — ED Notes (Signed)
Cbc to be redrawn per lab request

## 2014-09-13 NOTE — H&P (Signed)
Patient ID: Debbie Delacruz MRN: [id number], DOB/AGE: [id number]   Admit date: 09/13/2014   Primary Physician: [name], MD Primary Cardiologist: Dr Clayborn Bigness  HPI:  69 y/o female followed by Dr Clayborn Bigness with a history of CAD, s/p RCA PCI in 1999. She was admitted to [facility] in Feb with a GI bleed and had chest pain. Cath revealed progression of CAD and she was referred to Dr Roxy Manns for CABG which is scheduled for May 5th (see his note from 09/11/14).           The pt was in her usual state of health today when she developed nausea at work and got up to use the bathroom. She had to sit back down and the next thing she knew EMS was there. Her head was on her desk. She admits her heart was "pounding" before this episode. She does not complain of chest pain. She has had occasional palpitations at home but no syncope. She has had mild orthostatic symptoms when she gets up to fast.    Problem List: Past Medical History  Diagnosis Date  . Hypertension   . Anxiety   . Hyperlipidemia   . Myocardial infarct, old     15 years ago  . Sleep apnea   . GERD (gastroesophageal reflux disease)   . Anemia   . Coronary artery disease     2x stents, Dr. Clayborn Bigness    Past Surgical History  Procedure Laterality Date  . Coronary stent placement      multiple, x 2  . Appendectomy    . Cholecystectomy    . Hernia repair Bilateral     x3  . Abdominal hysterectomy    . Bladder repair    . Cataracts      both eyes  . Carpal tunnel release      left  . Cardiac catheterization      [facility] x2 stent  . Back surgery       Allergies:  Allergies  Allergen Reactions  . Lipitor [Atorvastatin]     myalgia     Home Medications Current Facility-Administered Medications  Medication Dose Route Frequency Provider Last Rate Last Dose  . 0.9 %  sodium chloride infusion   Intravenous Continuous Hosie Poisson, MD 75 mL/hr at 09/13/14 1826    . acetaminophen (TYLENOL) tablet 500 mg  500 mg  Oral Q6H PRN Hosie Poisson, MD      . Derrill Memo ON 09/14/2014] aspirin EC tablet 81 mg  81 mg Oral Daily Hosie Poisson, MD      . enoxaparin (LOVENOX) injection 40 mg  40 mg Subcutaneous Q24H Hosie Poisson, MD      . Derrill Memo ON 09/14/2014] FLUoxetine (PROZAC) capsule 40 mg  40 mg Oral Daily Hosie Poisson, MD      . Derrill Memo ON 09/14/2014] metoprolol succinate (TOPROL-XL) 24 hr tablet 50 mg  50 mg Oral Daily Hosie Poisson, MD      . pantoprazole (PROTONIX) EC tablet 40 mg  40 mg Oral BID Hosie Poisson, MD      . Derrill Memo ON 09/14/2014] rosuvastatin (CRESTOR) tablet 40 mg  40 mg Oral q1800 Hosie Poisson, MD         Family History  Problem Relation Age of Onset  . Heart disease Mother   . Stroke Mother   . Heart attack Mother   . Hypertension Mother   . COPD Father   . Heart disease Father   . Heart attack Father   .  Glaucoma Father   . Osteoporosis Father   . Emphysema Father   . Cancer Sister 33    colon  . Hypertension Sister   . Stroke Brother   . Colon cancer Brother   . Hypertension Brother   . Esophageal cancer Neg Hx   . Stomach cancer Neg Hx   . Rectal cancer Neg Hx   . Stroke Maternal Grandmother   . Osteoporosis Maternal Grandmother   . Hypertension Maternal Grandmother   . Hypertension Paternal Grandmother   . Hypertension Paternal Grandfather      History   Social History  . Marital Status: Married    Spouse Name: N/A  . Number of Children: N/A  . Years of Education: N/A   Occupational History  . Not on file.   Social History Main Topics  . Smoking status: Never Smoker   . Smokeless tobacco: Never Used  . Alcohol Use: No     Comment: very rarely  . Drug Use: No  . Sexual Activity: Not on file   Other Topics Concern  . Not on file   Social History Narrative   Lives in [place] with 26 YO nephew and husband. 3 dogs outside.      Work - Doctor, general practice, [employer]      Diet - regular, limited meat      Exercise - walks some     Review of  Systems: General: negative for chills, fever, night sweats or weight changes.  Cardiovascular: negative for chest pain, dyspnea on exertion, edema, orthopnea, palpitations, paroxysmal nocturnal dyspnea or shortness of breath Dermatological: negative for rash Respiratory: negative for cough or wheezing Urologic: negative for hematuria Abdominal: negative for nausea, vomiting, diarrhea, bright red blood per rectum, melena, or hematemesis Neurologic: negative for visual changes, syncope, or dizziness All other systems reviewed and are otherwise negative except as noted above.  Physical Exam: Blood pressure 131/68, pulse 90, temperature 98.8 F (37.1 C), temperature source Oral, resp. rate 16, height 5\' 2"  (1.575 m), weight 179 lb 6.4 oz (81.375 kg), SpO2 97 %.  General appearance: alert, cooperative, no distress, moderately obese and pale Neck: no JVD and Rt carotid bruit Lungs: clear to auscultation bilaterally Heart: regular rate and rhythm and 2/6 systolic murmur at AOV, preserved S2 Abdomen: obese, non tender Extremities: no edema Pulses: 2+ and symmetric Skin: Skin color, texture, turgor normal. No rashes or lesions or pale Neurologic: Grossly normal    Labs:   Results for orders placed or performed during the hospital encounter of 09/13/14 (from the past 24 hour(s))  Basic metabolic panel     Status: Abnormal   Collection Time: 09/13/14  1:28 PM  Result Value Ref Range   Sodium 138 135 - 145 mmol/L   Potassium 4.1 3.5 - 5.1 mmol/L   Chloride 105 96 - 112 mmol/L   CO2 23 19 - 32 mmol/L   Glucose, Bld 119 (H) 70 - 99 mg/dL   BUN 19 6 - 23 mg/dL   Creatinine, Ser 0.86 0.50 - 1.10 mg/dL   Calcium 9.0 8.4 - 10.5 mg/dL   GFR calc non Af Amer 69 (L) >90 mL/min   GFR calc Af Amer 80 (L) >90 mL/min   Anion gap 10 5 - 15  I-stat troponin, ED (not at [facility])     Status: None   Collection Time: 09/13/14  1:43 PM  Result Value Ref Range   Troponin i, poc 0.01 0.00 - 0.08 ng/mL    Comment  3          CBG, ED     Status: Abnormal   Collection Time: 09/13/14  1:43 PM  Result Value Ref Range   Glucose-Capillary 122 (H) 70 - 99 mg/dL  CBC with Differential     Status: Abnormal   Collection Time: 09/13/14  3:00 PM  Result Value Ref Range   WBC 9.4 4.0 - 10.5 K/uL   RBC 4.28 3.87 - 5.11 MIL/uL   Hemoglobin 8.3 (L) 12.0 - 15.0 g/dL   HCT 29.0 (L) 36.0 - 46.0 %   MCV 67.8 (L) 78.0 - 100.0 fL   MCH 19.4 (L) 26.0 - 34.0 pg   MCHC 28.6 (L) 30.0 - 36.0 g/dL   RDW 17.5 (H) 11.5 - 15.5 %   Platelets 263 150 - 400 K/uL   Neutrophils Relative % 83 (H) 43 - 77 %   Lymphocytes Relative 10 (L) 12 - 46 %   Monocytes Relative 6 3 - 12 %   Eosinophils Relative 1 0 - 5 %   Basophils Relative 0 0 - 1 %   Neutro Abs 7.8 (H) 1.7 - 7.7 K/uL   Lymphs Abs 0.9 0.7 - 4.0 K/uL   Monocytes Absolute 0.6 0.1 - 1.0 K/uL   Eosinophils Absolute 0.1 0.0 - 0.7 K/uL   Basophils Absolute 0.0 0.0 - 0.1 K/uL   RBC Morphology POLYCHROMASIA PRESENT    WBC Morphology TOXIC GRANULATION    Smear Review LARGE PLATELETS PRESENT   Troponin I (q 6hr x 3)     Status: None   Collection Time: 09/13/14  5:29 PM  Result Value Ref Range   Troponin I <0.03 <0.031 ng/mL     Radiology/Studies: Dg Chest Port 1 View  09/13/2014   CLINICAL DATA:  Chest pain.  Loss of consciousness.  EXAM: PORTABLE CHEST - 1 VIEW  COMPARISON:  August 29, 2012.  FINDINGS: The heart size and mediastinal contours are within normal limits. Both lungs are clear. No pneumothorax or pleural effusion is noted. The visualized skeletal structures are unremarkable.  IMPRESSION: No acute cardiopulmonary abnormality seen   Electronically Signed   By: Marijo Conception, M.D.   On: 09/13/2014 14:56    EKG:NSR without acute changes  ASSESSMENT AND PLAN:  Principal Problem:   Syncope Active Problems:   Coronary artery disease   Essential hypertension, benign   Anemia   Dyslipidemia   PLAN: Admit, check echo, check carotid dopplers, monitor,  cycle Troponin.    Henri Medal, PA-C 09/13/2014, 6:44 PM I have seen and examined the patient along with KILROY,LUKE K, PA-C.  I have reviewed the chart, notes and new data.  I agree with PA's note.  Key new complaints: episode of syncope was preceded by lengthy prodrome of nausea, diaphoresis and flushing, all more suggestive of a neurally mediated event than arrhythmia. However, she also had rapid palpitations. Key examination changes: loud right carotid bruit, less impressive aortic systolic ejection murmur Key new findings / data: still moderately anemic, Hgb 8.3  PLAN: I think it would be worthwhile to involve GI in this institution as well. I would not categorize her as high risk for EGD and endoscopy might offer very valuable insight into anemia etiology. Carotid Duplex. Repeat her echo - new wall motion abnormality? Murmur might be anemia/flow related. Telemetry. She will need a transfusion before surgery.  Sanda Klein, MD, [place] [phone] 09/13/2014, 7:00 PM

## 2014-09-13 NOTE — ED Notes (Signed)
Per EMS: pt from work for eval of sudden onset of substernal cp with no radiation that began today at 12 pm. Pt reports nausea, diaphoresis, and reports that when she stood up to go vomit she had LOC. Per co-workers pt was pale and unresponsive x2 minutes. EMS noted pt to be axox 4 en route and reports 3/10 cp at this time, received 324 mg of aspirin and takes long acting nitro normally. EKG showed NSR.  Pt has 90% blockage in "arteries of my heart and I am supposed to have a CABG soon." nad noted.

## 2014-09-14 ENCOUNTER — Other Ambulatory Visit: Payer: Self-pay | Admitting: *Deleted

## 2014-09-14 DIAGNOSIS — E785 Hyperlipidemia, unspecified: Secondary | ICD-10-CM | POA: Diagnosis not present

## 2014-09-14 DIAGNOSIS — K2270 Barrett's esophagus without dysplasia: Secondary | ICD-10-CM | POA: Diagnosis not present

## 2014-09-14 DIAGNOSIS — I2510 Atherosclerotic heart disease of native coronary artery without angina pectoris: Secondary | ICD-10-CM

## 2014-09-14 DIAGNOSIS — D520 Dietary folate deficiency anemia: Secondary | ICD-10-CM

## 2014-09-14 DIAGNOSIS — I10 Essential (primary) hypertension: Secondary | ICD-10-CM | POA: Diagnosis not present

## 2014-09-14 DIAGNOSIS — R55 Syncope and collapse: Secondary | ICD-10-CM | POA: Diagnosis not present

## 2014-09-14 LAB — VITAMIN B12
Vitamin B-12: 314 pg/mL (ref 211–911)
Vitamin B-12: 262 pg/mL (ref 211–911)

## 2014-09-14 LAB — LIPID PANEL
Cholesterol: 180 mg/dL (ref 0–200)
HDL: 29 mg/dL — ABNORMAL LOW (ref >39)
LDL Cholesterol: 121 mg/dL — ABNORMAL HIGH (ref 0–99)
Total CHOL/HDL Ratio: 6.2 ratio
Triglycerides: 148 mg/dL (ref <150)
VLDL: 30 mg/dL (ref 0–40)

## 2014-09-14 LAB — ABO/RH: ABO/RH(D): O POS

## 2014-09-14 LAB — FERRITIN
Ferritin: 5 ng/mL — ABNORMAL LOW (ref 10–291)
Ferritin: 6 ng/mL — ABNORMAL LOW (ref 10–291)

## 2014-09-14 LAB — CBC
HCT: 28.6 % — ABNORMAL LOW (ref 36.0–46.0)
Hemoglobin: 8.1 g/dL — ABNORMAL LOW (ref 12.0–15.0)
MCH: 19.3 pg — ABNORMAL LOW (ref 26.0–34.0)
MCHC: 28.3 g/dL — AB (ref 30.0–36.0)
MCV: 68.1 fL — ABNORMAL LOW (ref 78.0–100.0)
Platelets: 234 10*3/uL (ref 150–400)
RBC: 4.2 MIL/uL (ref 3.87–5.11)
RDW: 17.4 % — ABNORMAL HIGH (ref 11.5–15.5)
WBC: 4.9 10*3/uL (ref 4.0–10.5)

## 2014-09-14 LAB — IRON AND TIBC
Iron: 13 ug/dL — ABNORMAL LOW (ref 42–145)
Iron: 12 ug/dL — ABNORMAL LOW (ref 42–145)
Saturation Ratios: 3 % — ABNORMAL LOW (ref 20–55)
Saturation Ratios: 3 % — ABNORMAL LOW (ref 20–55)
TIBC: 438 ug/dL (ref 250–470)
TIBC: 417 ug/dL (ref 250–470)
UIBC: 425 ug/dL — AB (ref 125–400)
UIBC: 405 ug/dL — ABNORMAL HIGH (ref 125–400)

## 2014-09-14 LAB — TYPE AND SCREEN
ABO/RH(D): O POS
Antibody Screen: NEGATIVE

## 2014-09-14 LAB — TROPONIN I

## 2014-09-14 LAB — FOLATE
FOLATE: 14.2 ng/mL
Folate: 14.6 ng/mL

## 2014-09-14 LAB — RETICULOCYTES
RBC.: 4.53 MIL/uL (ref 3.87–5.11)
RETIC COUNT ABSOLUTE: 54.4 10*3/uL (ref 19.0–186.0)
Retic Ct Pct: 1.2 % (ref 0.4–3.1)

## 2014-09-14 MED ORDER — METOPROLOL SUCCINATE ER 50 MG PO TB24
75.0000 mg | ORAL_TABLET | Freq: Every day | ORAL | Status: DC
  Administered 2014-09-15 – 2014-09-17 (×3): 75 mg via ORAL
  Filled 2014-09-14 (×6): qty 1

## 2014-09-14 MED ORDER — AMLODIPINE BESYLATE 5 MG PO TABS
5.0000 mg | ORAL_TABLET | Freq: Every day | ORAL | Status: DC
Start: 2014-09-14 — End: 2014-09-18
  Administered 2014-09-14 – 2014-09-17 (×4): 5 mg via ORAL
  Filled 2014-09-14 (×2): qty 1
  Filled 2014-09-14: qty 2
  Filled 2014-09-14: qty 1

## 2014-09-14 MED ORDER — ISOSORBIDE MONONITRATE ER 30 MG PO TB24
30.0000 mg | ORAL_TABLET | Freq: Every day | ORAL | Status: DC
Start: 2014-09-14 — End: 2014-09-18
  Administered 2014-09-14 – 2014-09-17 (×4): 30 mg via ORAL
  Filled 2014-09-14 (×4): qty 1

## 2014-09-14 NOTE — Progress Notes (Signed)
Patient Demographics  Debbie Delacruz, is a 69 y.o. female, DOB - [birth year], CVE:[id number]  Admit date - 09/13/2014   Admitting Physician Hosie Poisson, MD  Outpatient Primary MD for the patient is [name], MD  LOS - 1   Chief Complaint  Patient presents with  . Chest Pain  . Loss of Consciousness        Subjective:   Debbie Delacruz today has, No headache, No chest pain, No abdominal pain - No Nausea, No new weakness tingling or numbness, No Cough - SOB.    Assessment & Plan    1. Syncope with chest pain in a patient who was scheduled for CABG in 2 weeks. Currently chest pain-free, highly suspicious for cardiac dysrhythmia, currently on telemetry cardiology on board, on aspirin, statin, beta blocker and Imdur. Echogram pending. Have requested cardiothoracic surgery to evaluate to see if we can proceed with CABG now instead of 2 weeks.   2. Hx of anemia. Apparently required transfusions in January, her and her hemoglobin at 8. This does not account for her syncope. She is undergoing outpatient GI workup which should be pursued. Continue PPI. Will check baseline anemia panel. No inpatient GI testing is warranted at this time unless H&H drops precipitously.   3. Carotid bruits. Carotid duplex ordered.   4. Dyslipidemia. Continue statin.   5. GERD. On PPI.   6. Essential hypertension continue combination of beta blocker, Norvasc and Imdur.     Code Status: Full  Family Communication: Husband bedside  Disposition Plan: Home   Procedures    Echogram, carotid duplex both pending   Consults  Cards, CTVS   Medications  Scheduled Meds: . amLODipine  5 mg Oral Daily  . aspirin EC  81 mg Oral Daily  . enoxaparin (LOVENOX) injection  40 mg Subcutaneous Q24H  .  FLUoxetine  40 mg Oral Daily  . isosorbide mononitrate  30 mg Oral Daily  . [START ON 09/15/2014] metoprolol succinate  75 mg Oral Daily  . pantoprazole  40 mg Oral BID  . rosuvastatin  40 mg Oral q1800   Continuous Infusions:  PRN Meds:.acetaminophen  DVT Prophylaxis  Lovenox    Lab Results  Component Value Date   PLT 234 09/14/2014    Antibiotics    Anti-infectives    None          Objective:   Filed Vitals:   09/14/14 0440 09/14/14 0550 09/14/14 0739 09/14/14 0926  BP: 136/58  145/75 141/62  Pulse: 68  82 87  Temp:  98.2 F (36.8 C) 98.1 F (36.7 C)   TempSrc:  Oral Oral   Resp: 15  18   Height:      Weight:  81.829 kg (180 lb 6.4 oz)    SpO2: 99%  99%     Wt Readings from Last 3 Encounters:  09/14/14 81.829 kg (180 lb 6.4 oz)  09/11/14 81.194 kg (179 lb)  09/11/14 81.194 kg (179 lb)     Intake/Output Summary (Last 24 hours) at 09/14/14 1043 Last data filed at 09/14/14 0916  Gross per 24 hour  Intake  545.4 ml  Output   1000 ml  Net -454.6 ml     Physical Exam  Awake Alert, Oriented  X 3, No new F.N deficits, Normal affect [place].AT,PERRAL Supple Neck,No JVD, No cervical lymphadenopathy appriciated.  Symmetrical Chest wall movement, Good air movement bilaterally, CTAB RRR,No Gallops,Rubs or new Murmurs, No Parasternal Heave +ve B.Sounds, Abd Soft, No tenderness, No organomegaly appriciated, No rebound - guarding or rigidity. No Cyanosis, Clubbing or edema, No new Rash or bruise     Data Review   Micro Results No results found for this or any previous visit (from the past 240 hour(s)).  Radiology Reports Dg Chest Port 1 View  09/13/2014   CLINICAL DATA:  Chest pain.  Loss of consciousness.  EXAM: PORTABLE CHEST - 1 VIEW  COMPARISON:  August 29, 2012.  FINDINGS: The heart size and mediastinal contours are within normal limits. Both lungs are clear. No pneumothorax or pleural effusion is noted. The visualized skeletal structures are unremarkable.   IMPRESSION: No acute cardiopulmonary abnormality seen   Electronically Signed   By: Marijo Conception, M.D.   On: 09/13/2014 14:56     CBC  Recent Labs Lab 09/13/14 1500 09/14/14 0624  WBC 9.4 4.9  HGB 8.3* 8.1*  HCT 29.0* 28.6*  PLT 263 234  MCV 67.8* 68.1*  MCH 19.4* 19.3*  MCHC 28.6* 28.3*  RDW 17.5* 17.4*  LYMPHSABS 0.9  --   MONOABS 0.6  --   EOSABS 0.1  --   BASOSABS 0.0  --     Chemistries   Recent Labs Lab 09/13/14 1328  NA 138  K 4.1  CL 105  CO2 23  GLUCOSE 119*  BUN 19  CREATININE 0.86  CALCIUM 9.0   ------------------------------------------------------------------------------------------------------------------ estimated creatinine clearance is 63.8 mL/min (by C-G formula based on Cr of 0.86). ------------------------------------------------------------------------------------------------------------------ No results for input(s): HGBA1C in the last 72 hours. ------------------------------------------------------------------------------------------------------------------  Recent Labs  09/14/14 0624  CHOL 180  HDL 29*  LDLCALC 121*  TRIG 148  CHOLHDL 6.2   ------------------------------------------------------------------------------------------------------------------ No results for input(s): TSH, T4TOTAL, T3FREE, THYROIDAB in the last 72 hours.  Invalid input(s): FREET3 ------------------------------------------------------------------------------------------------------------------  Recent Labs  09/13/14 1930  VITAMINB12 314  FOLATE 14.6  FERRITIN 5*  TIBC 438  IRON 13*  RETICCTPCT 1.1    Coagulation profile No results for input(s): INR, PROTIME in the last 168 hours.  No results for input(s): DDIMER in the last 72 hours.  Cardiac Enzymes  Recent Labs Lab 09/13/14 1729 09/13/14 2341 09/14/14 0624  TROPONINI <0.03 <0.03 <0.03    ------------------------------------------------------------------------------------------------------------------ Invalid input(s): POCBNP     Time Spent in minutes   35   Maylon Sailors K M.D on 09/14/2014 at 10:43 AM  Between 7am to 7pm - Pager - [phone]  After 7pm go to [email or web address] - password [facility]  [facility]   Office  [phone]

## 2014-09-14 NOTE — Progress Notes (Signed)
CARDIAC REHAB PHASE I   Pre-op education done. Reviewed IS, guidelines, surgery book, activity progression, sternal precautions. Pt verbalized understanding. Pt given pre and post surgery video information. Pt states she will watch videos with her husband tonight. Pt states she is anxious about surgery and "wishes she knew when she was going."  States her husband is retired and will stay with her after surgery. 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 Pt very appreciative of education.   [phone]  Lenna Sciara, RN, BSN 09/14/2014 3:20 PM

## 2014-09-14 NOTE — Progress Notes (Signed)
[address]       [place] [address]             [phone]     CARDIOTHORACIC SURGERY PROGRESS NOTE  Subjective: Patient is well known to me from recent office consultation earlier this week - please see note in EPIC.  Events of last 24 hours noted.  Patient denies any symptoms of chest discomfort, nausea, or dizziness since she was admitted. At present she feels quite well.    Objective: Vital signs in last 24 hours: Temp:  [98 F (36.7 C)-98.4 F (36.9 C)] 98 F (36.7 C) (04/22 1425) Pulse Rate:  [66-88] 66 (04/22 1425) Cardiac Rhythm:  [-] Normal sinus rhythm (04/22 1600) Resp:  [11-26] 18 (04/22 1425) BP: (107-151)/(51-95) 107/54 mmHg (04/22 1425) SpO2:  [92 %-100 %] 97 % (04/22 1425) Weight:  [81.829 kg (180 lb 6.4 oz)] 81.829 kg (180 lb 6.4 oz) (04/22 0550)  Physical Exam:  Rhythm:   sinus  Breath sounds: clear  Heart sounds:  RRR w/out murmur  Incisions:  n/a  Abdomen:  soft  Extremities:  warm   Intake/Output from previous day: 04/21 0701 - 04/22 0700 In: 125.2 [I.V.:125.2] Out: 1000 [Urine:1000] Intake/Output this shift: Total I/O In: 620.2 [P.O.:440; I.V.:180.2] Out: -   Lab Results:  Recent Labs  09/13/14 1500 09/14/14 0624  WBC 9.4 4.9  HGB 8.3* 8.1*  HCT 29.0* 28.6*  PLT 263 234   BMET:  Recent Labs  09/13/14 1328  NA 138  K 4.1  CL 105  CO2 23  GLUCOSE 119*  BUN 19  CREATININE 0.86  CALCIUM 9.0    CBG (last 3)   Recent Labs  09/13/14 1343  GLUCAP 122*   PT/INR:  No results for input(s): LABPROT, INR in the last 72 hours.  CXR:  PORTABLE CHEST - 1 VIEW  COMPARISON: August 29, 2012.  FINDINGS: The heart size and mediastinal contours are within normal limits. Both lungs are clear. No pneumothorax or pleural effusion is noted. The visualized skeletal structures are unremarkable.  IMPRESSION: No acute cardiopulmonary abnormality seen   Electronically Signed  By: Marijo Conception, M.D.   On: 09/13/2014 14:56  Assessment/Plan:  Events of yesterday notable in that the patient reports feeling nauseated at work associated with some chest tightness.  She got up to go use the bathroom and suffered a sudden syncopal episode.  She recovered quickly and has not had any recurrent symptoms since admission.  She has ruled out for an acute MI.  She is anemic but Hgb has remained stable since admission and no signs of ongoing blood loss.  There are no recorded stool samples for hemoccult testing during this admission, although an order has been placed. The source of anemia has not been confirmed, although recent upper GI barium contrast study was suggestive of possible esophagitis.  She was seen by a gastroenterologist in [place] who refused to perform upper GI endoscopy due to the presence of known 3-vessel CAD.  It's difficult to know for certain whether or not the patient's syncopal event yesterday was related to myocardial ischemia.  She needs CABG, but I remain concerned about the possibility of active esophagitis, gastritis, and/or peptic ulcer disease.  I recommend GI consult to reconsider upper GI endoscopy prior to subjecting her to the stress of CABG.  We can plan CABG early next week if EGD does not reveal something concerning.  I spent in excess of 15 minutes  during the conduct of this hospital encounter and >50% of this time involved direct face-to-face encounter with the patient for counseling and/or coordination of their care.     [name] 09/14/2014 6:55 PM

## 2014-09-14 NOTE — Care Management Note (Unsigned)
    Page 1 of 1   09/14/2014     4:41:06 PM CARE MANAGEMENT NOTE 09/14/2014  Patient:  Debbie Delacruz, Debbie Delacruz   Account Number:  [id number]  Date Initiated:  09/14/2014  Documentation initiated by:  GRAVES-BIGELOW,BRENDA  Subjective/Objective Assessment:   Pt admitted for cp and syncope. Pt is from home with husband.     Action/Plan:   CM to continue to monitor for disposition needs.   Anticipated DC Date:  09/15/2014   Anticipated DC Plan:  [place]  CM consult      Choice offered to / List presented to:             Status of service:  In process, will continue to follow Medicare Important Message given?  YES (If response is "NO", the following Medicare IM given date fields will be blank) Date Medicare IM given:  09/14/2014 Medicare IM given by:  GRAVES-BIGELOW,BRENDA Date Additional Medicare IM given:   Additional Medicare IM given by:    Discharge Disposition:    Per UR Regulation:  Reviewed for med. necessity/level of care/duration of stay  If discussed at [place] of Stay Meetings, dates discussed:    Comments:

## 2014-09-14 NOTE — Progress Notes (Addendum)
Subjective: No CP, SOB or dizziness.  Objective: Vital signs in last 24 hours: Temp:  [97.6 F (36.4 C)-98.8 F (37.1 C)] 98.1 F (36.7 C) (04/22 0739) Pulse Rate:  [68-90] 82 (04/22 0739) Resp:  [11-26] 18 (04/22 0739) BP: (103-145)/(51-95) 145/75 mmHg (04/22 0739) SpO2:  [92 %-100 %] 99 % (04/22 0739) Weight:  [179 lb (81.194 kg)-180 lb 6.4 oz (81.829 kg)] 180 lb 6.4 oz (81.829 kg) (04/22 0550) Last BM Date: 09/13/14  Intake/Output from previous day: 04/21 0701 - 04/22 0700 In: 47.2 [I.V.:47.2] Out: 1000 [Urine:1000] Intake/Output this shift: Total I/O In: 240 [P.O.:240] Out: -   Medications Current Facility-Administered Medications  Medication Dose Route Frequency Provider Last Rate Last Dose  . 0.9 %  sodium chloride infusion   Intravenous Continuous Hosie Poisson, MD 75 mL/hr at 09/14/14 0753    . acetaminophen (TYLENOL) tablet 500 mg  500 mg Oral Q6H PRN Hosie Poisson, MD      . aspirin EC tablet 81 mg  81 mg Oral Daily Hosie Poisson, MD      . enoxaparin (LOVENOX) injection 40 mg  40 mg Subcutaneous Q24H Hosie Poisson, MD   40 mg at 09/13/14 1951  . FLUoxetine (PROZAC) capsule 40 mg  40 mg Oral Daily Hosie Poisson, MD      . metoprolol succinate (TOPROL-XL) 24 hr tablet 50 mg  50 mg Oral Daily Hosie Poisson, MD      . pantoprazole (PROTONIX) EC tablet 40 mg  40 mg Oral BID Hosie Poisson, MD   40 mg at 09/13/14 2135  . rosuvastatin (CRESTOR) tablet 40 mg  40 mg Oral q1800 Hosie Poisson, MD         PE: General appearance: alert, cooperative and no distress Lungs: clear to auscultation bilaterally Heart: regular rate and rhythm and 1/6 sys MM Extremities: No LEE Pulses: 2+ and symmetric Skin: Warm and dry. Neurologic: Grossly normal  Lab Results:   Recent Labs  09/13/14 1500 09/14/14 0624  WBC 9.4 4.9  HGB 8.3* 8.1*  HCT 29.0* 28.6*  PLT 263 234   BMET  Recent Labs  09/13/14 1328  NA 138  K 4.1  CL 105  CO2 23  GLUCOSE 119*  BUN 19  CREATININE  0.86  CALCIUM 9.0   PT/INR No results for input(s): LABPROT, INR in the last 72 hours. Cholesterol  Recent Labs  09/14/14 0624  CHOL 180   Lipid Panel     Component Value Date/Time   CHOL 180 09/14/2014 0624   TRIG 148 09/14/2014 0624   HDL 29* 09/14/2014 0624   CHOLHDL 6.2 09/14/2014 0624   VLDL 30 09/14/2014 0624   LDLCALC 121* 09/14/2014 0624   LDLDIRECT 206.9 03/28/2013 0900     Assessment/Plan  69 y/o female followed by Dr Clayborn Bigness with a history of CAD, s/p RCA PCI in 1999. She was admitted to [facility] in Feb with a GI bleed and had chest pain. Cath revealed progression of CAD and she was referred to Dr Roxy Manns for CABG which is scheduled for May 5th (see his note from 09/11/14).  The pt was in her usual state of health today when she developed nausea at work and got up to use the bathroom. She had to sit back down and the next thing she knew EMS was there. Her head was on her desk. She admits her heart was "pounding" before this episode. She does not complain of chest pain. She has had occasional palpitations at home  but no syncope. She has had mild orthostatic symptoms when she gets up to fast.   Principal Problem:   Syncope Active Problems:   Dyslipidemia   Essential hypertension, benign   Anemia   Coronary artery disease  Plan:  Troponin negative x 3 in the setting of a hgb of 8.1 which is reassuring that this was not an arrhythmic mediated syncope.  NSR on tele with no alarms.   Echo pending.  Needs IV iron replacement.  BP stable.  ASA, toprol 50, crestor.    LOS: 1 day    HAGER, BRYAN PA-C 09/14/2014 8:51 AM  Patient seen and examined. Agree with assessment and plan. No chest pain presently, but pt states the pain was similar to her previous cardiac pain. Cardiac markers negative. H/H 8.1/28.6 today. Will increase cardiac meds; not well bb's with resting P in the upper 90's and current BP in the 140's. Will increase Toprol to 75 mg. Pt was also on ranexa 500  mg bid, imdur 60 mg daily and amlodipine when she was seen on 4/19 by Dr. Roxy Manns; currently is not recieving. Will resume nitrates at 30 mg and amlodipine 5 mg. GI eval. Consider contacting Dr. Roxy Manns to move up surgery date if stable from GI standpoint.   Troy Sine, MD, [facility] 09/14/2014 10:17 AM

## 2014-09-15 DIAGNOSIS — D509 Iron deficiency anemia, unspecified: Secondary | ICD-10-CM | POA: Diagnosis not present

## 2014-09-15 DIAGNOSIS — R55 Syncope and collapse: Secondary | ICD-10-CM | POA: Diagnosis not present

## 2014-09-15 DIAGNOSIS — Z8719 Personal history of other diseases of the digestive system: Secondary | ICD-10-CM

## 2014-09-15 DIAGNOSIS — I25119 Atherosclerotic heart disease of native coronary artery with unspecified angina pectoris: Secondary | ICD-10-CM

## 2014-09-15 LAB — CBC
HCT: 30.4 % — ABNORMAL LOW (ref 36.0–46.0)
Hemoglobin: 8.6 g/dL — ABNORMAL LOW (ref 12.0–15.0)
MCH: 19.2 pg — AB (ref 26.0–34.0)
MCHC: 28.3 g/dL — AB (ref 30.0–36.0)
MCV: 67.9 fL — ABNORMAL LOW (ref 78.0–100.0)
PLATELETS: 257 10*3/uL (ref 150–400)
RBC: 4.48 MIL/uL (ref 3.87–5.11)
RDW: 17.3 % — ABNORMAL HIGH (ref 11.5–15.5)
WBC: 5.7 10*3/uL (ref 4.0–10.5)

## 2014-09-15 LAB — OCCULT BLOOD X 1 CARD TO LAB, STOOL: FECAL OCCULT BLD: POSITIVE — AB

## 2014-09-15 MED ORDER — SODIUM CHLORIDE 0.9 % IV SOLN
25.0000 mg | Freq: Once | INTRAVENOUS | Status: AC
  Administered 2014-09-15: 25 mg via INTRAVENOUS
  Filled 2014-09-15: qty 0.5

## 2014-09-15 MED ORDER — SODIUM CHLORIDE 0.9 % IV SOLN
500.0000 mg | INTRAVENOUS | Status: AC
  Administered 2014-09-15 – 2014-09-17 (×3): 500 mg via INTRAVENOUS
  Filled 2014-09-15 (×4): qty 10

## 2014-09-15 NOTE — Progress Notes (Signed)
UR completed 

## 2014-09-15 NOTE — Progress Notes (Signed)
Patient is ambulating independently without difficulty about 600 feet.  She is aware to walk 3 times daily up until surgery and to report any signs and symptoms of angina.  Reviewed IS, "heart pillow", and pulmonary toilet.  She is awaiting EGD Sunday/Monday to evaluate anemia issues.  She seems less anxious, just wants to get the surgery behind her.  Encouraged and supported. [id number]

## 2014-09-15 NOTE — Consult Note (Signed)
[facility] Consult: 10:05 AM 09/15/2014  LOS: 2 days    Referring Provider: Dr Roxy Manns Primary Care Physician:  [name], MD Primary Gastroenterologist:  Dr. Fuller Plan.  Dr Tiffany Kocher in [place].     Reason for Consultation:  Dr.Owen requesting EGD be performed.  Pt has microcytic anemia.    HPI: Debbie Delacruz is a 69 y.o. female.  PMH CAD with PCI 1999.  OSA, Obesity, GERD, colon polyps.  S/p numerous abd/pelvic surgeries including right inguinal hernia repair 3..  12/2012  Colonoscopy.  Surveillance for hx adenomatous polyps in 1998, 2008 Descending and sigmoid diverticulosis, int rrhoids.  No recurrent polyps.   In 06/2014 anemia treated at [facility] with blood transfusion and parenteral iron.  FOBT was negative. Cath then showed progressive CAD with significant LAD disease and referred to Dr Roxy Manns. CABG set for May 2016.  Due to CAD GI decided against EGD.  Sent home on po iron. In 07/2014 had esophagram showing HH, "inflamed esophagus", GER.  Started on BID OTC Ranitidine.    Admitted 2 days ago with nausea, vomiting, syncope, pounding heart but no CP.  Not clear if she vomited bloody or coffee ground material. Ruled out for acute MI.  Labs show Hgb 8.3 to 8.1 with MCV 67.  In 06/2013 Hgb was 9.2, MCV 68. . Iron 12, ferritin 5.  B12 and Folate ok. Renal fx normal.   Patient has had some constipation due to her intake of twice daily oral iron. Her stools are not dark. She has not passed any blood or melena per rectum. There's been no unusual bleeding from the nose or significant bruising on the body. GI-wise she does not have significant symptoms and denies dysphagia.  She does admit to some mild discomfort in the right lower quadrant in the region of her 3 previous hernia repairs. This pain is not  severe and has been present for quite a long time.  Other than the recent nausea and vomiting she does not have these symptoms.  Past Medical History  Diagnosis Date  . Hypertension   . Anxiety   . Hyperlipidemia   . GERD (gastroesophageal reflux disease)   . Anemia   . Coronary artery disease     2x stents, Dr. Clayborn Bigness  . Family history of adverse reaction to anesthesia     "brother; S/P lipotripsy in [place]; transferred to [facility]; had to put him on life support for 2 days"  . PONV (postoperative nausea and vomiting)   . Anginal pain   . Myocardial infarct, old 1999    15 years ago  . Sleep apnea     "don't use a mask" (09/13/2014)  . History of blood transfusion 05/2014    "we haven't figured out why I needed it yet" (09/13/2014)  . History of hiatal hernia   . Headache     "weekly" (09/13/2014)    Past Surgical History  Procedure Laterality Date  . Incontinence surgery    . Cataract extraction w/ intraocular lens  implant, bilateral    .  Carpal tunnel release Left   . Back surgery    . Laparoscopic cholecystectomy    . Inguinal hernia repair Right X 3    "last one was in the 1990's; still have hernia there now" (09/13/2014)  . Coronary angioplasty with stent placement  1999    [facility] x2 stent  . Cardiac catheterization  06/2014  . Appendectomy    . Vaginal hysterectomy    . Lumbar disc surgery  1990's?    Prior to Admission medications   Medication Sig Start Date End Date Taking? Authorizing Provider  acetaminophen (TYLENOL) 500 MG tablet Take 500 mg by mouth every 6 (six) hours as needed for headache.   Yes Historical Provider, MD  amLODipine (NORVASC) 10 MG tablet TAKE 1 TABLET BY MOUTH ONCE DAILY. 04/02/14  Yes [name], MD  aspirin EC 81 MG tablet Take 81 mg by mouth daily.   Yes Historical Provider, MD  ferrous sulfate 325 (65 FE) MG tablet Take 325 mg by mouth daily with breakfast.    Yes Historical Provider, MD  FLUoxetine (PROZAC) 20 MG capsule  Take 40 mg by mouth daily.   Yes Historical Provider, MD  isosorbide mononitrate (IMDUR) 60 MG 24 hr tablet Take 120 mg by mouth daily.  08/28/14 08/28/15 Yes Historical Provider, MD  metoprolol succinate (TOPROL-XL) 50 MG 24 hr tablet TAKE ONE TABLET BY MOUTH ONCE DAILY, WITH OR IMMEDIATELY FOLLOWING A MEAL. 04/02/14  Yes [name], MD  nitroGLYCERIN (NITROLINGUAL) 0.4 MG/SPRAY spray Place 1 spray under the tongue every 5 (five) minutes x 3 doses as needed for chest pain. 09/11/14  Yes [name], MD  omeprazole (PRILOSEC OTC) 20 MG tablet Take 20 mg by mouth daily.   Yes Historical Provider, MD  pantoprazole (PROTONIX) 40 MG tablet Take 1 tablet (40 mg total) by mouth 2 (two) times daily. 09/11/14  Yes [name], MD  rosuvastatin (CRESTOR) 20 MG tablet Take 40 mg by mouth daily.    Yes Historical Provider, MD  traMADol (ULTRAM) 50 MG tablet TAKE 1 OR 2 TABLETS BY MOUTH EVERY 8 HOURS AS NEEDED. Patient taking differently: TAKE 1 OR 2 TABLETS BY MOUTH EVERY 8 HOURS AS NEEDED FOR PAIN 04/02/14  Yes [name], MD    Scheduled Meds: . amLODipine  5 mg Oral Daily  . aspirin EC  81 mg Oral Daily  . enoxaparin (LOVENOX) injection  40 mg Subcutaneous Q24H  . FLUoxetine  40 mg Oral Daily  . isosorbide mononitrate  30 mg Oral Daily  . metoprolol succinate  75 mg Oral Daily  . pantoprazole  40 mg Oral BID  . rosuvastatin  40 mg Oral q1800   Infusions:   PRN Meds: acetaminophen   Allergies as of 09/13/2014 - Review Complete 09/13/2014  Allergen Reaction Noted  . Lipitor [atorvastatin]  11/16/2012    Family History  Problem Relation Age of Onset  . Heart disease Mother   . Stroke Mother   . Heart attack Mother   . Hypertension Mother   . COPD Father   . Heart disease Father   . Heart attack Father   . Glaucoma Father   . Osteoporosis Father   . Emphysema Father   . Cancer Sister 2    colon  . Hypertension Sister   . Stroke Brother   . Colon cancer Brother   .  Hypertension Brother   . Esophageal cancer Neg Hx   . Stomach cancer Neg Hx   .  Rectal cancer Neg Hx   . Stroke Maternal Grandmother   . Osteoporosis Maternal Grandmother   . Hypertension Maternal Grandmother   . Hypertension Paternal Grandmother   . Hypertension Paternal Grandfather     History   Social History  . Marital Status: Married    Spouse Name: N/A  . Number of Children: N/A  . Years of Education: N/A   Occupational History  . Not on file.   Social History Main Topics  . Smoking status: Never Smoker   . Smokeless tobacco: Never Used  . Alcohol Use: Yes     Comment: 09/13/2014 "might have a drink a couple times/yr"  . Drug Use: No  . Sexual Activity: Not Currently   Other Topics Concern  . Not on file   Social History Narrative   Lives in [place] with 30 YO nephew and husband. 3 dogs outside.      Work - Doctor, general practice, [employer]      Diet - regular, limited meat      Exercise - walks some    REVIEW OF SYSTEMS: Constitutional: Appetite is good. Weight is stable.   ENT:  No nose bleeds Pulm:  Stable dyspnea with prolonged effort but able to climb a few stairs without problems CV:  No palpitations, no LE edema.  GU:  No hematuria, no frequency GI:  Per HPI Heme:  Per HPI Transfusions:  Per HPI Neuro:  No headaches, no peripheral tingling or numbness.. Syncope as per HPI Derm:  No itching, no rash or sores.  Endocrine:  No sweats or chills.  No polyuria or dysuria Immunization:  Not queried Travel:  None beyond local counties in last few months.    PHYSICAL EXAM: Vital signs in last 24 hours: Filed Vitals:   09/15/14 0530  BP: 121/60  Pulse: 63  Temp: 98 F (36.7 C)  Resp: 18   Wt Readings from Last 3 Encounters:  09/15/14 178 lb 9.6 oz (81.012 kg)  09/11/14 179 lb (81.194 kg)  09/11/14 179 lb (81.194 kg)    General: Pleasant, not acutely ill-appearing. Good historian. WF who looks younger than age.  HeadNo facial asymmetry or  edema. No signs of trauma.  Eyes:  No scleral icterus, no conjunctival pallor. Ears:  No difficulty hearing.  Nose:   No congestion or discharge Mouth:  Clear, moist. No lesions or exudate. Neck:  no JVD, no thyromegaly, no masses. Lung: Clear bilaterally. No cough or dyspnea Heart: RRR. No MRG. S1/S2 audible Abdomen:  soft, without masses. Active bowel sounds. Not tender. No HSM.Marland Kitchen   Rectal: FOBT negative on scant specimen.  No masses   Musc/Skeltl: no joint swelling, contracture or redness. Extremities:  No CCE  Neurologic:  Oriented x 3.  No tremor, no limb weakness.  Skin:  No telangectasia Tattoos:  none Nodes:  No cervical adenopathy   Psych:  Pleasant, intelligent, relaxed.   Intake/Output from previous day: 04/22 0701 - 04/23 0700 In: 740.2 [P.O.:560; I.V.:180.2] Out: 500 [Urine:500] Intake/Output this shift:    LAB RESULTS:  Recent Labs  09/13/14 1500 09/14/14 0624 09/15/14 0315  WBC 9.4 4.9 5.7  HGB 8.3* 8.1* 8.6*  HCT 29.0* 28.6* 30.4*  PLT 263 234 257   BMET Lab Results  Component Value Date   NA 138 09/13/2014   NA 137 03/28/2013   NA 141 11/16/2012   K 4.1 09/13/2014   K 4.0 03/28/2013   K 4.9 11/16/2012   CL 105 09/13/2014   CL  102 03/28/2013   CL 104 11/16/2012   CO2 23 09/13/2014   CO2 28 03/28/2013   CO2 23 11/16/2012   GLUCOSE 119* 09/13/2014   GLUCOSE 113* 03/28/2013   GLUCOSE 116* 11/16/2012   BUN 19 09/13/2014   BUN 19 03/28/2013   BUN 23 11/16/2012   CREATININE 0.86 09/13/2014   CREATININE 0.8 03/28/2013   CREATININE 0.8 11/16/2012   CALCIUM 9.0 09/13/2014   CALCIUM 9.0 03/28/2013   CALCIUM 9.4 11/16/2012   LFT No results for input(s): PROT, ALBUMIN, AST, ALT, ALKPHOS, BILITOT, BILIDIR, IBILI in the last 72 hours. PT/INR No results found for: INR, PROTIME  RADIOLOGY STUDIES: Dg Chest Port 1 View  09/13/2014   CLINICAL DATA:  Chest pain.  Loss of consciousness.  EXAM: PORTABLE CHEST - 1 VIEW  COMPARISON:  August 29, 2012.   FINDINGS: The heart size and mediastinal contours are within normal limits. Both lungs are clear. No pneumothorax or pleural effusion is noted. The visualized skeletal structures are unremarkable.  IMPRESSION: No acute cardiopulmonary abnormality seen   Electronically Signed   By: Marijo Conception, M.D.   On: 09/13/2014 14:56    ENDOSCOPIC STUDIES: Per HPI  IMPRESSION:   *  Microcytic anemia.  Treated with 2 units packed red blood cells and parenteral iron in January 2016.  On chronic twice a day iron since then.  *  Barium esophagram 07/2014 showing esophagitis, hiatal hernia, GER. Though she is heme negative, wonder if the hiatal hernia is large enough to be causing mechanical irritation and associated blood loss. Patient's H2 blocker/ranitidine has been replaced with oral Protonix.  *  History adenomatous polyps dating back to 1998. Surveillance colonoscopy in 2014 showed only left sided diverticulosis and internal hemorrhoids.  *  LAD/three-vessel disease equivalent. With her recent syncope, V date 4 CABG moved up to 5/26.  *  OSA.  Doesn't use CPAP at night due to claustrophobia.  PLAN:     *  Will check a TSH in the morning. *  Timing of EGD per Dr. Hilarie Fredrickson.   Azucena Freed  09/15/2014, 10:05 AM Pager: [phone]

## 2014-09-15 NOTE — Progress Notes (Signed)
Subjective:  No complaints of chest pain or shortness of breath.  Objective:  Vital Signs in the last 24 hours: BP 121/60 mmHg  Pulse 63  Temp(Src) 98 F (36.7 C) (Oral)  Resp 18  Ht 5\' 2"  (1.575 m)  Wt 81.012 kg (178 lb 9.6 oz)  BMI 32.66 kg/m2  SpO2 97%  Physical Exam: Pleasant female mildly obese in no acute distress Lungs:  Clear Cardiac:  Regular rhythm, normal S1 and S2, no S3 Abdomen:  Soft, nontender, no masses Extremities:  No edema present  Intake/Output from previous day: 04/22 0701 - 04/23 0700 In: 740.2 [P.O.:560; I.V.:180.2] Out: 500 [Urine:500]  Weight Filed Weights   09/13/14 1720 09/14/14 0550 09/15/14 0530  Weight: 81.375 kg (179 lb 6.4 oz) 81.829 kg (180 lb 6.4 oz) 81.012 kg (178 lb 9.6 oz)    Lab Results: Basic Metabolic Panel:  Recent Labs  09/13/14 1328  NA 138  K 4.1  CL 105  CO2 23  GLUCOSE 119*  BUN 19  CREATININE 0.86   CBC:  Recent Labs  09/13/14 1500 09/14/14 0624 09/15/14 0315  WBC 9.4 4.9 5.7  NEUTROABS 7.8*  --   --   HGB 8.3* 8.1* 8.6*  HCT 29.0* 28.6* 30.4*  MCV 67.8* 68.1* 67.9*  PLT 263 234 257   Cardiac Enzymes: Troponin (Point of Care Test)  Recent Labs  09/13/14 1343  TROPIPOC 0.01   Cardiac Panel (last 3 results)  Recent Labs  09/13/14 1729 09/13/14 2341 09/14/14 0624  TROPONINI <0.03 <0.03 <0.03    Telemetry: Sinus rhythm, no bradycardia  Assessment/Plan:  1.  Severe three-vessel coronary artery disease plans are for surgery on Tuesday 2.  Recent history of GI bleeding in February with continued anemia 3.  Hypertension controlled 4.  Syncope without recurrence 5.  Iron deficiency anemia  Recommendations:  Plan to keep in the hospital for surgery.  She is to have endoscopy either tomorrow or Monday to assess the status of the previous bleeding.  Continue telemetry monitoring.      Kerry Hough  MD [facility]  09/15/2014, 10:43 AM

## 2014-09-15 NOTE — Progress Notes (Addendum)
Patient Demographics  Debbie Delacruz, is a 69 y.o. female, DOB - 09-21-1945, JHE:[id number]  Admit date - 09/13/2014   Admitting Physician Hosie Poisson, MD  Outpatient Primary MD for the patient is [name], MD  LOS - 2   Chief Complaint  Patient presents with  . Chest Pain  . Loss of Consciousness        Subjective:   Debbie Delacruz today has, No headache, No chest pain, No abdominal pain - No Nausea, No new weakness tingling or numbness, No Cough - SOB.    Assessment & Plan    1. Syncope with chest pain in a patient who was scheduled for CABG in 2 weeks. Currently chest pain-free, highly suspicious for cardiac dysrhythmia, currently on telemetry cardiology on board, on aspirin, statin, beta blocker and Imdur. Echogram pending and carotid duplex. Seen by cardiothoracic surgery and CABG scheduled for 09/18/2014.   2. Hx of anemia. Apparently required transfusions in January, her and her hemoglobin at 8. This does not account for her syncope. He has had an unremarkable colonoscopy by Dr. Fuller Plan. Cardiothoracic surgery wants an EGD this admission due to her underlying esophagitis. Have requested GI to evaluate, continue PPI. Current H&H remain stable in fact is going up. For her iron deficiency IV iron to be replaced, pharmacy to dose on 09/15/2014.   3. Carotid bruits. Carotid duplex pending.   4. Dyslipidemia. Continue statin.   5. GERD. On PPI.   6. Essential hypertension continue combination of beta blocker, Norvasc and Imdur.     Code Status: Full  Family Communication: Husband bedside  Disposition Plan: Home   Procedures    Echogram, carotid duplex both pending   Consults  Cards, CTVS, [facility]   Medications  Scheduled Meds: . amLODipine  5 mg Oral  Daily  . aspirin EC  81 mg Oral Daily  . enoxaparin (LOVENOX) injection  40 mg Subcutaneous Q24H  . FLUoxetine  40 mg Oral Daily  . isosorbide mononitrate  30 mg Oral Daily  . metoprolol succinate  75 mg Oral Daily  . pantoprazole  40 mg Oral BID  . rosuvastatin  40 mg Oral q1800   Continuous Infusions:  PRN Meds:.acetaminophen  DVT Prophylaxis  Lovenox    Lab Results  Component Value Date   PLT 257 09/15/2014    Antibiotics    Anti-infectives    None          Objective:   Filed Vitals:   09/14/14 1040 09/14/14 1425 09/14/14 1941 09/15/14 0530  BP: 151/68 107/54 125/62 121/60  Pulse: 76 66 68 63  Temp:  98 F (36.7 C) 98.7 F (37.1 C) 98 F (36.7 C)  TempSrc:  Oral Oral Oral  Resp: 19 18 18 18   Height:      Weight:    81.012 kg (178 lb 9.6 oz)  SpO2: 99% 97% 97% 97%    Wt Readings from Last 3 Encounters:  09/15/14 81.012 kg (178 lb 9.6 oz)  09/11/14 81.194 kg (179 lb)  09/11/14 81.194 kg (179 lb)     Intake/Output Summary (Last 24 hours) at 09/15/14 1114 Last data filed at 09/15/14 0659  Gross per 24 hour  Intake    320 ml  Output  500 ml  Net   -180 ml     Physical Exam  Awake Alert, Oriented X 3, No new F.N deficits, Normal affect [place].AT,PERRAL Supple Neck,No JVD, No cervical lymphadenopathy appriciated.  Symmetrical Chest wall movement, Good air movement bilaterally, CTAB RRR,No Gallops,Rubs or new Murmurs, No Parasternal Heave +ve B.Sounds, Abd Soft, No tenderness, No organomegaly appriciated, No rebound - guarding or rigidity. No Cyanosis, Clubbing or edema, No new Rash or bruise     Data Review   Micro Results No results found for this or any previous visit (from the past 240 hour(s)).  Radiology Reports Dg Chest Port 1 View  09/13/2014   CLINICAL DATA:  Chest pain.  Loss of consciousness.  EXAM: PORTABLE CHEST - 1 VIEW  COMPARISON:  August 29, 2012.  FINDINGS: The heart size and mediastinal contours are within normal limits. Both  lungs are clear. No pneumothorax or pleural effusion is noted. The visualized skeletal structures are unremarkable.  IMPRESSION: No acute cardiopulmonary abnormality seen   Electronically Signed   By: Marijo Conception, M.D.   On: 09/13/2014 14:56     CBC  Recent Labs Lab 09/13/14 1500 09/14/14 0624 09/15/14 0315  WBC 9.4 4.9 5.7  HGB 8.3* 8.1* 8.6*  HCT 29.0* 28.6* 30.4*  PLT 263 234 257  MCV 67.8* 68.1* 67.9*  MCH 19.4* 19.3* 19.2*  MCHC 28.6* 28.3* 28.3*  RDW 17.5* 17.4* 17.3*  LYMPHSABS 0.9  --   --   MONOABS 0.6  --   --   EOSABS 0.1  --   --   BASOSABS 0.0  --   --     Chemistries   Recent Labs Lab 09/13/14 1328  NA 138  K 4.1  CL 105  CO2 23  GLUCOSE 119*  BUN 19  CREATININE 0.86  CALCIUM 9.0   ------------------------------------------------------------------------------------------------------------------ estimated creatinine clearance is 63.5 mL/min (by C-G formula based on Cr of 0.86). ------------------------------------------------------------------------------------------------------------------ No results for input(s): HGBA1C in the last 72 hours. ------------------------------------------------------------------------------------------------------------------  Recent Labs  09/14/14 0624  CHOL 180  HDL 29*  LDLCALC 121*  TRIG 148  CHOLHDL 6.2   ------------------------------------------------------------------------------------------------------------------ No results for input(s): TSH, T4TOTAL, T3FREE, THYROIDAB in the last 72 hours.  Invalid input(s): FREET3 ------------------------------------------------------------------------------------------------------------------  Recent Labs  09/13/14 1930 09/14/14 1225  VITAMINB12 314 262  FOLATE 14.6 14.2  FERRITIN 5* 6*  TIBC 438 417  IRON 13* 12*  RETICCTPCT 1.1 1.2    Coagulation profile No results for input(s): INR, PROTIME in the last 168 hours.  No results for input(s): DDIMER  in the last 72 hours.  Cardiac Enzymes  Recent Labs Lab 09/13/14 1729 09/13/14 2341 09/14/14 0624  TROPONINI <0.03 <0.03 <0.03   ------------------------------------------------------------------------------------------------------------------ Invalid input(s): POCBNP     Time Spent in minutes   35   SINGH,PRASHANT K M.D on 09/15/2014 at 11:14 AM  Between 7am to 7pm - Pager - [phone]  After 7pm go to [email or web address] - password [facility]  [facility]   Office  [phone]

## 2014-09-15 NOTE — Progress Notes (Signed)
MEDICATION RELATED CONSULT NOTE - INITIAL  Pharmacy Consult:  Infed Indication:  Iron-deficiency anemia  Allergies  Allergen Reactions  . Lipitor [Atorvastatin]     myalgia    Patient Measurements: Height: 5\' 2"  (157.5 cm) Weight: 178 lb 9.6 oz (81.012 kg) IBW/kg (Calculated) : 50.1  Vital Signs: Temp: 98 F (36.7 C) (04/23 0530) Temp Source: Oral (04/23 0530) BP: 121/60 mmHg (04/23 0530) Pulse Rate: 63 (04/23 0530) Intake/Output from previous day: 04/22 0701 - 04/23 0700 In: 740.2 [P.O.:560; I.V.:180.2] Out: 500 [Urine:500]  Labs:  Recent Labs  09/13/14 1328 09/13/14 1500 09/14/14 0624 09/15/14 0315  WBC  --  9.4 4.9 5.7  HGB  --  8.3* 8.1* 8.6*  HCT  --  29.0* 28.6* 30.4*  PLT  --  263 234 257  CREATININE 0.86  --   --   --    Estimated Creatinine Clearance: 63.5 mL/min (by C-G formula based on Cr of 0.86).   Microbiology: No results found for this or any previous visit (from the past 720 hour(s)).    Assessment: 60 YOF with history of anemia presented s/p syncopal episode.  Anemia panel revealed patient is iron deficient and Pharmacy consulted to supplement using IV iron.  Noted that patient was on ferrous sulfate PTA.   Goal of Therapy:  Hemoglobin ~14 g/dL   Plan:  - Iron dextran 25mg  IV x 1 test dose (tolerated), then - Iron dextran 500mg  IV Q24H x 3 doses - Pharmacy will sign off.  Thank you for the consult!    Maricella Filyaw D. Mina Marble, PharmD, BCPS Pager:  [phone] 09/15/2014, 11:44 AM

## 2014-09-15 NOTE — Progress Notes (Signed)
  Echocardiogram 2D Echocardiogram has been performed.  Margalit Leece FRANCES 09/15/2014, 11:29 AM

## 2014-09-16 DIAGNOSIS — R55 Syncope and collapse: Secondary | ICD-10-CM | POA: Diagnosis not present

## 2014-09-16 DIAGNOSIS — I25119 Atherosclerotic heart disease of native coronary artery with unspecified angina pectoris: Secondary | ICD-10-CM | POA: Diagnosis not present

## 2014-09-16 DIAGNOSIS — I2510 Atherosclerotic heart disease of native coronary artery without angina pectoris: Secondary | ICD-10-CM | POA: Diagnosis not present

## 2014-09-16 DIAGNOSIS — Z8719 Personal history of other diseases of the digestive system: Secondary | ICD-10-CM | POA: Diagnosis present

## 2014-09-16 DIAGNOSIS — I10 Essential (primary) hypertension: Secondary | ICD-10-CM | POA: Diagnosis not present

## 2014-09-16 DIAGNOSIS — E785 Hyperlipidemia, unspecified: Secondary | ICD-10-CM | POA: Diagnosis not present

## 2014-09-16 DIAGNOSIS — D520 Dietary folate deficiency anemia: Secondary | ICD-10-CM | POA: Diagnosis not present

## 2014-09-16 LAB — HEMOGLOBIN AND HEMATOCRIT, BLOOD
HCT: 29.9 % — ABNORMAL LOW (ref 36.0–46.0)
Hemoglobin: 8.4 g/dL — ABNORMAL LOW (ref 12.0–15.0)

## 2014-09-16 LAB — TSH: TSH: 2.406 u[IU]/mL (ref 0.350–4.500)

## 2014-09-16 MED ORDER — PANTOPRAZOLE SODIUM 40 MG PO TBEC
40.0000 mg | DELAYED_RELEASE_TABLET | Freq: Every day | ORAL | Status: DC
  Administered 2014-09-17: 40 mg via ORAL
  Filled 2014-09-16: qty 1

## 2014-09-16 NOTE — Progress Notes (Signed)
Patient Demographics  Debbie Delacruz, is a 69 y.o. female, DOB - [birth year], FAO:[id number]  Admit date - 09/13/2014   Admitting Physician Hosie Poisson, MD  Outpatient Primary MD for the patient is [name], MD  LOS - 3   Chief Complaint  Patient presents with  . Chest Pain  . Loss of Consciousness        Subjective:   Debbie Delacruz today has, No headache, No chest pain, No abdominal pain - No Nausea, No new weakness tingling or numbness, No Cough - SOB.  No dizziness remains symptom-free.  Assessment & Plan    1. Syncope with chest pain in a patient who was scheduled for CABG in 2 weeks. Currently chest pain-free, highly suspicious for cardiac dysrhythmia, currently on telemetry cardiology on board, on aspirin, statin, beta blocker and Imdur. Stable Echogram & carotid duplex. Seen by cardiothoracic surgery and CABG scheduled for 09/18/2014.   2. Hx of anemia. Apparently required transfusions in January, her and her hemoglobin at 8. This does not account for her syncope. He has had an unremarkable colonoscopy by Dr. Fuller Plan. Cardiothoracic surgery wants an EGD this admission due to her underlying esophagitis. GI on board and due for EGD on 09/17/2014, continue PPI. Current H&H remain stable in fact is going up. For her iron deficiency IV iron to be replaced, pharmacy to dose on 09/15/2014.   3. Carotid bruits. Carotid duplex stable.    4. Dyslipidemia. Continue statin.    5. GERD. On PPI.    6. Essential hypertension continue combination of beta blocker, Norvasc and Imdur.     Code Status: Full  Family Communication: Husband bedside  Disposition Plan: Home   Procedures    Echogram   - Left ventricle: The cavity size was normal. There was moderateconcentric  hypertrophy. Systolic function was normal. The estimated ejection fraction was in the range of 55% to 60%.Regional wall motion abnormalities cannot be excluded. - Mitral valve: There was mild regurgitation. - Left atrium: The atrium was mildly dilated.   Carotid Dopplers -  Preliminary report: 1-39% ICA stenosis. Vertebral artery flow is antegrade.    Consults  Cards, CTVS,  GI   Medications  Scheduled Meds: . amLODipine  5 mg Oral Daily  . aspirin EC  81 mg Oral Daily  . FLUoxetine  40 mg Oral Daily  . iron dextran (INFED/DEXFERRUM) infusion  500 mg Intravenous Q24H  . isosorbide mononitrate  30 mg Oral Daily  . metoprolol succinate  75 mg Oral Daily  . [START ON 09/17/2014] pantoprazole  40 mg Oral Daily  . rosuvastatin  40 mg Oral q1800   Continuous Infusions:  PRN Meds:.acetaminophen  DVT Prophylaxis  Lovenox    Lab Results  Component Value Date   PLT 257 09/15/2014    Antibiotics    Anti-infectives    None          Objective:   Filed Vitals:   09/15/14 1500 09/15/14 2121 09/16/14 0609 09/16/14 0910  BP: 106/84 104/57 124/76 125/55  Pulse: 65 64 71 68  Temp: 99.2 F (37.3 C) 98.1 F (36.7 C) 97.9 F (36.6 C)   TempSrc: Oral     Resp: 18 18 18    Height:  Weight:   81.33 kg (179 lb 4.8 oz)   SpO2: 98% 96% 99%     Wt Readings from Last 3 Encounters:  09/16/14 81.33 kg (179 lb 4.8 oz)  09/11/14 81.194 kg (179 lb)  09/11/14 81.194 kg (179 lb)     Intake/Output Summary (Last 24 hours) at 09/16/14 1119 Last data filed at 09/16/14 0900  Gross per 24 hour  Intake    680 ml  Output   1450 ml  Net   -770 ml     Physical Exam  Awake Alert, Oriented X 3, No new F.N deficits, Normal affect [place].AT,PERRAL Supple Neck,No JVD, No cervical lymphadenopathy appriciated.  Symmetrical Chest wall movement, Good air movement bilaterally, CTAB RRR,No Gallops,Rubs or new Murmurs, No Parasternal Heave +ve B.Sounds, Abd Soft, No tenderness, No  organomegaly appriciated, No rebound - guarding or rigidity. No Cyanosis, Clubbing or edema, No new Rash or bruise     Data Review   Micro Results No results found for this or any previous visit (from the past 240 hour(s)).  Radiology Reports Dg Chest Port 1 View  09/13/2014   CLINICAL DATA:  Chest pain.  Loss of consciousness.  EXAM: PORTABLE CHEST - 1 VIEW  COMPARISON:  August 29, 2012.  FINDINGS: The heart size and mediastinal contours are within normal limits. Both lungs are clear. No pneumothorax or pleural effusion is noted. The visualized skeletal structures are unremarkable.  IMPRESSION: No acute cardiopulmonary abnormality seen   Electronically Signed   By: Marijo Conception, M.D.   On: 09/13/2014 14:56     CBC  Recent Labs Lab 09/13/14 1500 09/14/14 0624 09/15/14 0315 09/16/14 0320  WBC 9.4 4.9 5.7  --   HGB 8.3* 8.1* 8.6* 8.4*  HCT 29.0* 28.6* 30.4* 29.9*  PLT 263 234 257  --   MCV 67.8* 68.1* 67.9*  --   MCH 19.4* 19.3* 19.2*  --   MCHC 28.6* 28.3* 28.3*  --   RDW 17.5* 17.4* 17.3*  --   LYMPHSABS 0.9  --   --   --   MONOABS 0.6  --   --   --   EOSABS 0.1  --   --   --   BASOSABS 0.0  --   --   --     Chemistries   Recent Labs Lab 09/13/14 1328  NA 138  K 4.1  CL 105  CO2 23  GLUCOSE 119*  BUN 19  CREATININE 0.86  CALCIUM 9.0   ------------------------------------------------------------------------------------------------------------------ estimated creatinine clearance is 63.6 mL/min (by C-G formula based on Cr of 0.86). ------------------------------------------------------------------------------------------------------------------ No results for input(s): HGBA1C in the last 72 hours. ------------------------------------------------------------------------------------------------------------------  Recent Labs  09/14/14 0624  CHOL 180  HDL 29*  LDLCALC 121*  TRIG 148  CHOLHDL 6.2    ------------------------------------------------------------------------------------------------------------------  Recent Labs  09/16/14 0320  TSH 2.406   ------------------------------------------------------------------------------------------------------------------  Recent Labs  09/13/14 1930 09/14/14 1225  VITAMINB12 314 262  FOLATE 14.6 14.2  FERRITIN 5* 6*  TIBC 438 417  IRON 13* 12*  RETICCTPCT 1.1 1.2    Coagulation profile No results for input(s): INR, PROTIME in the last 168 hours.  No results for input(s): DDIMER in the last 72 hours.  Cardiac Enzymes  Recent Labs Lab 09/13/14 1729 09/13/14 2341 09/14/14 0624  TROPONINI <0.03 <0.03 <0.03   ------------------------------------------------------------------------------------------------------------------ Invalid input(s): POCBNP     Time Spent in minutes   35   SINGH,PRASHANT K M.D on 09/16/2014 at 11:19 AM  Between  7am to 7pm - Pager - [phone]  After 7pm go to [email or web address] - password [facility]  [facility]   Office  [phone]

## 2014-09-16 NOTE — Progress Notes (Signed)
          Daily Rounding Note  09/16/2014, 11:00 AM  LOS: 3 days   SUBJECTIVE:       Feels well, no sob, weakness, pain.   OBJECTIVE:         Vital signs in last 24 hours:    Temp:  [97.9 F (36.6 C)-99.2 F (37.3 C)] 97.9 F (36.6 C) (04/24 0609) Pulse Rate:  [64-71] 68 (04/24 0910) Resp:  [18] 18 (04/24 0609) BP: (104-125)/(55-84) 125/55 mmHg (04/24 0910) SpO2:  [96 %-99 %] 99 % (04/24 0609) Weight:  [179 lb 4.8 oz (81.33 kg)] 179 lb 4.8 oz (81.33 kg) (04/24 0609) Last BM Date: 09/15/14 Filed Weights   09/14/14 0550 09/15/14 0530 09/16/14 0609  Weight: 180 lb 6.4 oz (81.829 kg) 178 lb 9.6 oz (81.012 kg) 179 lb 4.8 oz (81.33 kg)   General: looks rested, comfortable.  Not ill.   Heart: RRR Chest: clear bil.   No SOB or cough Abdomen: soft, NT, ND.  No mass or HSM.  Active BS  Extremities: no CCE  Neuro/Psych:  Pleasant, relaxed.  No gross deficits.   Intake/Output from previous day: 04/23 0701 - 04/24 0700 In: 720 [P.O.:720] Out: 1000 [Urine:1000]  Intake/Output this shift: Total I/O In: 200 [P.O.:200] Out: 450 [Urine:450]  Lab Results:  Recent Labs  09/13/14 1500 09/14/14 0624 09/15/14 0315 09/16/14 0320  WBC 9.4 4.9 5.7  --   HGB 8.3* 8.1* 8.6* 8.4*  HCT 29.0* 28.6* 30.4* 29.9*  PLT 263 234 257  --    BMET  Recent Labs  09/13/14 1328  NA 138  K 4.1  CL 105  CO2 23  GLUCOSE 119*  BUN 19  CREATININE 0.86  CALCIUM 9.0   Scheduled Meds: . amLODipine  5 mg Oral Daily  . aspirin EC  81 mg Oral Daily  . FLUoxetine  40 mg Oral Daily  . iron dextran (INFED/DEXFERRUM) infusion  500 mg Intravenous Q24H  . isosorbide mononitrate  30 mg Oral Daily  . metoprolol succinate  75 mg Oral Daily  . [START ON 09/17/2014] pantoprazole  40 mg Oral Daily  . rosuvastatin  40 mg Oral q1800   Continuous Infusions:  PRN Meds:.acetaminophen   ASSESMENT:   * Microcytic anemia. Treated with 2 units packed red  blood cells and parenteral iron in January/February 2016. On chronic twice a day iron since then.  Daily doses x 3 of Infed just initiated.  stool of 4/23 tested FOBT +.  There has been no gross bleeding or melena.    * Barium esophagram 07/2014 showing esophagitis, hiatal hernia, GER. Though she is heme negative, wonder if the hiatal hernia is large enough to be causing mechanical irritation and associated blood loss. Patient's H2 blocker/ranitidine has been replaced with oral Protonix.  * History adenomatous polyps dating back to 1998. Surveillance colonoscopy in 2014 showed only left sided diverticulosis and internal hemorrhoids.  * LAD/three-vessel disease equivalent. With her recent syncope, V date 4 CABG moved up to 5/26.  * OSA. Doesn't use CPAP at night due to claustrophobia.    PLAN   *  EGD tomorrow.  Will stop Lovenox in anticipation of this, so not injection tonight.  Daily Protonix.     Azucena Freed  09/16/2014, 11:00 AM Pager: [phone]

## 2014-09-16 NOTE — Progress Notes (Signed)
UR completed 

## 2014-09-16 NOTE — Progress Notes (Signed)
Subjective:  No complaints of chest pain or shortness of breath. Awaiting endo tomorrow.  Repost one stool positive for blood.  Objective:  Vital Signs in the last 24 hours: BP 124/76 mmHg  Pulse 71  Temp(Src) 97.9 F (36.6 C) (Oral)  Resp 18  Ht 5\' 2"  (1.575 m)  Wt 81.33 kg (179 lb 4.8 oz)  BMI 32.79 kg/m2  SpO2 99%  Physical Exam: Pleasant female mildly obese in no acute distress Lungs:  Clear Cardiac:  Regular rhythm, normal S1 and S2, no S3 Abdomen:  Soft, nontender, no masses Extremities:  No edema present  Intake/Output from previous day: 04/23 0701 - 04/24 0700 In: 720 [P.O.:720] Out: 1000 [Urine:1000]  Weight Filed Weights   09/14/14 0550 09/15/14 0530 09/16/14 0609  Weight: 81.829 kg (180 lb 6.4 oz) 81.012 kg (178 lb 9.6 oz) 81.33 kg (179 lb 4.8 oz)    Lab Results: Basic Metabolic Panel:  Recent Labs  09/13/14 1328  NA 138  K 4.1  CL 105  CO2 23  GLUCOSE 119*  BUN 19  CREATININE 0.86   CBC:  Recent Labs  09/13/14 1500 09/14/14 0624 09/15/14 0315 09/16/14 0320  WBC 9.4 4.9 5.7  --   NEUTROABS 7.8*  --   --   --   HGB 8.3* 8.1* 8.6* 8.4*  HCT 29.0* 28.6* 30.4* 29.9*  MCV 67.8* 68.1* 67.9*  --   PLT 263 234 257  --    Cardiac Enzymes: Troponin (Point of Care Test)  Recent Labs  09/13/14 1343  TROPIPOC 0.01   Cardiac Panel (last 3 results)  Recent Labs  09/13/14 1729 09/13/14 2341 09/14/14 0624  TROPONINI <0.03 <0.03 <0.03    Telemetry: Sinus rhythm, no bradycardia  Assessment/Plan:  1.  Severe three-vessel coronary artery disease plans are for surgery on Tuesday 2.  Recent history of GI bleeding in February with continued anemia endo planned tomorrow 3.  Hypertension controlled 4.  Syncope without recurrence 5.  Iron deficiency anemia  Recommendations:  Plan to keep in the hospital for surgery.  She is to have endoscopy tomorrow  to assess the status of the previous bleeding.  Continue telemetry monitoring. No heparin  now.     Kerry Hough  MD [facility]  09/16/2014, 8:15 AM

## 2014-09-16 NOTE — Progress Notes (Addendum)
VASCULAR LAB PRELIMINARY  PRELIMINARY  PRELIMINARY  PRELIMINARY  Pre-op Cardiac Surgery  Carotid Findings:  1-39% ICA stenosis. Vertebral artery flow is antegrade.   Delacruz, Debbie, RVT 09/16/2014, 11:07 AM   Upper Extremity Right Left  Brachial Pressures 119 130  Radial Waveforms Bi Tri  Ulnar Waveforms Bi Tri  Palmar Arch (Allen's Test) Normal  Obliterates with radial compression, normal with ulnar compression   Findings:  Palpable pedal pulses    Debbie Delacruz, Debbie Delacruz, RVT 09/17/2014, 3:23 PM

## 2014-09-17 ENCOUNTER — Observation Stay (HOSPITAL_COMMUNITY): Payer: Medicare Other

## 2014-09-17 ENCOUNTER — Encounter (HOSPITAL_COMMUNITY)
Admission: EM | Disposition: A | Discharge status: Home / Self Care | Payer: Self-pay | Source: Home / Self Care | Attending: Thoracic Surgery (Cardiothoracic Vascular Surgery)

## 2014-09-17 ENCOUNTER — Other Ambulatory Visit: Payer: Self-pay | Admitting: *Deleted

## 2014-09-17 ENCOUNTER — Observation Stay (HOSPITAL_COMMUNITY): Payer: Medicare Other | Admitting: Anesthesiology

## 2014-09-17 ENCOUNTER — Encounter (HOSPITAL_COMMUNITY): Payer: Self-pay | Admitting: *Deleted

## 2014-09-17 DIAGNOSIS — K2980 Duodenitis without bleeding: Secondary | ICD-10-CM | POA: Diagnosis present

## 2014-09-17 DIAGNOSIS — K2270 Barrett's esophagus without dysplasia: Secondary | ICD-10-CM

## 2014-09-17 DIAGNOSIS — I10 Essential (primary) hypertension: Secondary | ICD-10-CM | POA: Diagnosis not present

## 2014-09-17 DIAGNOSIS — I25119 Atherosclerotic heart disease of native coronary artery with unspecified angina pectoris: Secondary | ICD-10-CM

## 2014-09-17 DIAGNOSIS — I2510 Atherosclerotic heart disease of native coronary artery without angina pectoris: Secondary | ICD-10-CM | POA: Diagnosis not present

## 2014-09-17 DIAGNOSIS — I25110 Atherosclerotic heart disease of native coronary artery with unstable angina pectoris: Secondary | ICD-10-CM

## 2014-09-17 DIAGNOSIS — R55 Syncope and collapse: Secondary | ICD-10-CM | POA: Diagnosis not present

## 2014-09-17 HISTORY — PX: ESOPHAGOGASTRODUODENOSCOPY: SHX5428

## 2014-09-17 LAB — PULMONARY FUNCTION TEST
DL/VA % pred: 90 %
DL/VA: 4.1 ml/min/mmHg/L
DLCO unc % pred: 70 %
DLCO unc: 15.1 ml/min/mmHg
FEF 25-75 POST: 2.18 L/s
FEF 25-75 Pre: 2.11 L/sec
FEF2575-%Change-Post: 3 %
FEF2575-%PRED-PRE: 109 %
FEF2575-%Pred-Post: 112 %
FEV1-%Change-Post: 0 %
FEV1-%PRED-POST: 96 %
FEV1-%Pred-Pre: 95 %
FEV1-Post: 2.1 L
FEV1-Pre: 2.09 L
FEV1FVC-%Change-Post: 0 %
FEV1FVC-%PRED-PRE: 103 %
FEV6-%Change-Post: 0 %
FEV6-%Pred-Post: 94 %
FEV6-%Pred-Pre: 95 %
FEV6-Post: 2.6 L
FEV6-Pre: 2.61 L
FEV6FVC-%Change-Post: 0 %
FEV6FVC-%PRED-PRE: 104 %
FEV6FVC-%Pred-Post: 103 %
FVC-%CHANGE-POST: 0 %
FVC-%Pred-Post: 91 %
FVC-%Pred-Pre: 91 %
FVC-Post: 2.61 L
FVC-Pre: 2.61 L
POST FEV1/FVC RATIO: 81 %
PRE FEV1/FVC RATIO: 80 %
PRE FEV6/FVC RATIO: 100 %
Post FEV6/FVC ratio: 100 %
RV % PRED: 108 %
RV: 2.2 L
TLC % pred: 100 %
TLC: 4.77 L

## 2014-09-17 SURGERY — EGD (ESOPHAGOGASTRODUODENOSCOPY)
Anesthesia: Monitor Anesthesia Care

## 2014-09-17 MED ORDER — DEXTROSE 5 % IV SOLN
750.0000 mg | INTRAVENOUS | Status: DC
  Filled 2014-09-17: qty 750

## 2014-09-17 MED ORDER — METOPROLOL TARTRATE 12.5 MG HALF TABLET
12.5000 mg | ORAL_TABLET | Freq: Once | ORAL | Status: AC
  Administered 2014-09-18: 12.5 mg via ORAL
  Filled 2014-09-17: qty 1

## 2014-09-17 MED ORDER — PROPOFOL 10 MG/ML IV BOLUS
INTRAVENOUS | Status: DC | PRN
  Administered 2014-09-17: 10 mg via INTRAVENOUS
  Administered 2014-09-17 (×2): 20 mg via INTRAVENOUS
  Administered 2014-09-17: 10 mg via INTRAVENOUS
  Administered 2014-09-17 (×3): 20 mg via INTRAVENOUS

## 2014-09-17 MED ORDER — MIDAZOLAM HCL 5 MG/5ML IJ SOLN
INTRAMUSCULAR | Status: DC | PRN
  Administered 2014-09-17: 0.5 mg via INTRAVENOUS
  Administered 2014-09-17: 1.5 mg via INTRAVENOUS

## 2014-09-17 MED ORDER — SODIUM CHLORIDE 0.9 % IV SOLN
INTRAVENOUS | Status: DC
  Administered 2014-09-17: 11:00:00 via INTRAVENOUS

## 2014-09-17 MED ORDER — SODIUM CHLORIDE 0.9 % IV SOLN
INTRAVENOUS | Status: DC
  Filled 2014-09-17: qty 2.5

## 2014-09-17 MED ORDER — DEXTROSE 5 % IV SOLN
1.5000 g | INTRAVENOUS | Status: AC
  Administered 2014-09-18: .75 g via INTRAVENOUS
  Administered 2014-09-18: 1.5 g via INTRAVENOUS
  Filled 2014-09-17: qty 1.5

## 2014-09-17 MED ORDER — PHENYLEPHRINE HCL 10 MG/ML IJ SOLN
30.0000 ug/min | INTRAVENOUS | Status: DC
  Filled 2014-09-17: qty 2

## 2014-09-17 MED ORDER — NITROGLYCERIN IN D5W 200-5 MCG/ML-% IV SOLN
2.0000 ug/min | INTRAVENOUS | Status: DC
  Filled 2014-09-17: qty 250

## 2014-09-17 MED ORDER — BUTAMBEN-TETRACAINE-BENZOCAINE 2-2-14 % EX AERO
INHALATION_SPRAY | CUTANEOUS | Status: DC | PRN
  Administered 2014-09-17 (×2): 2 via TOPICAL

## 2014-09-17 MED ORDER — ONDANSETRON HCL 4 MG/2ML IJ SOLN
INTRAMUSCULAR | Status: DC | PRN
  Administered 2014-09-17: 4 mg via INTRAVENOUS

## 2014-09-17 MED ORDER — CHLORHEXIDINE GLUCONATE 4 % EX LIQD
60.0000 mL | Freq: Once | CUTANEOUS | Status: AC
  Administered 2014-09-17: 4 via TOPICAL
  Filled 2014-09-17: qty 60

## 2014-09-17 MED ORDER — VANCOMYCIN HCL 1000 MG IV SOLR
INTRAVENOUS | Status: AC
  Administered 2014-09-18: 800 mL
  Filled 2014-09-17: qty 1000

## 2014-09-17 MED ORDER — LACTATED RINGERS IV SOLN
INTRAVENOUS | Status: DC
  Administered 2014-09-17: 1000 mL via INTRAVENOUS

## 2014-09-17 MED ORDER — SODIUM CHLORIDE 0.9 % IV SOLN
INTRAVENOUS | Status: DC
  Filled 2014-09-17: qty 30

## 2014-09-17 MED ORDER — LORAZEPAM 0.5 MG PO TABS
0.5000 mg | ORAL_TABLET | Freq: Every evening | ORAL | Status: DC | PRN
  Administered 2014-09-17: 0.5 mg via ORAL
  Filled 2014-09-17: qty 1

## 2014-09-17 MED ORDER — VANCOMYCIN HCL 10 G IV SOLR
1250.0000 mg | INTRAVENOUS | Status: AC
  Administered 2014-09-18: 1250 mg via INTRAVENOUS
  Filled 2014-09-17: qty 1250

## 2014-09-17 MED ORDER — POTASSIUM CHLORIDE 2 MEQ/ML IV SOLN
80.0000 meq | INTRAVENOUS | Status: DC
  Filled 2014-09-17: qty 40

## 2014-09-17 MED ORDER — EPINEPHRINE HCL 1 MG/ML IJ SOLN
0.0000 ug/min | INTRAVENOUS | Status: DC
  Filled 2014-09-17: qty 4

## 2014-09-17 MED ORDER — COLCHICINE 0.6 MG PO TABS
0.6000 mg | ORAL_TABLET | Freq: Two times a day (BID) | ORAL | Status: AC
  Administered 2014-09-17 (×2): 0.6 mg via ORAL
  Filled 2014-09-17 (×2): qty 1

## 2014-09-17 MED ORDER — ALBUTEROL SULFATE (2.5 MG/3ML) 0.083% IN NEBU
2.5000 mg | INHALATION_SOLUTION | Freq: Once | RESPIRATORY_TRACT | Status: AC
  Administered 2014-09-17: 2.5 mg via RESPIRATORY_TRACT

## 2014-09-17 MED ORDER — BISACODYL 5 MG PO TBEC
5.0000 mg | DELAYED_RELEASE_TABLET | Freq: Once | ORAL | Status: AC
  Administered 2014-09-17: 5 mg via ORAL
  Filled 2014-09-17: qty 1

## 2014-09-17 MED ORDER — TEMAZEPAM 15 MG PO CAPS: 15.0000 mg | ORAL_CAPSULE | Freq: Once | ORAL | Status: DC | PRN

## 2014-09-17 MED ORDER — SODIUM CHLORIDE 0.9 % IV SOLN
INTRAVENOUS | Status: DC
  Filled 2014-09-17: qty 40

## 2014-09-17 MED ORDER — DOPAMINE-DEXTROSE 3.2-5 MG/ML-% IV SOLN
0.0000 ug/kg/min | INTRAVENOUS | Status: DC
  Filled 2014-09-17: qty 250

## 2014-09-17 MED ORDER — LIDOCAINE HCL (CARDIAC) 20 MG/ML IV SOLN
INTRAVENOUS | Status: DC | PRN
  Administered 2014-09-17: 60 mg via INTRAVENOUS

## 2014-09-17 MED ORDER — PAPAVERINE HCL 30 MG/ML IJ SOLN
INTRAMUSCULAR | Status: AC
  Administered 2014-09-18: 400 mL
  Filled 2014-09-17: qty 2.5

## 2014-09-17 MED ORDER — MAGNESIUM SULFATE 50 % IJ SOLN
40.0000 meq | INTRAMUSCULAR | Status: DC
  Filled 2014-09-17: qty 10

## 2014-09-17 MED ORDER — DEXMEDETOMIDINE HCL IN NACL 400 MCG/100ML IV SOLN
0.1000 ug/kg/h | INTRAVENOUS | Status: DC
Start: 2014-09-18 — End: 2014-09-18
  Filled 2014-09-17: qty 100

## 2014-09-17 MED ORDER — METOPROLOL SUCCINATE ER 50 MG PO TB24: 75.0000 mg | ORAL_TABLET | Freq: Every day | ORAL | Status: DC

## 2014-09-17 MED ORDER — CHLORHEXIDINE GLUCONATE 4 % EX LIQD
60.0000 mL | Freq: Once | CUTANEOUS | Status: AC
  Administered 2014-09-18: 4 via TOPICAL
  Filled 2014-09-17: qty 60

## 2014-09-17 NOTE — Op Note (Signed)
[facility] [place], [id number]   ENDOSCOPY PROCEDURE REPORT  PATIENT: Debbie Delacruz, Debbie Delacruz  MR#: [id number] BIRTHDATE: 01-Dec-1945 , 43  yrs. old GENDER: female ENDOSCOPIST: Inda Castle, MD REFERRED BY:  Ronette Deter, M.D. PROCEDURE DATE:  09/17/2014 PROCEDURE:  EGD w/ biopsy ASA CLASS:     Class II INDICATIONS:  iron deficiency anemia. MEDICATIONS: Monitored anesthesia care TOPICAL ANESTHETIC:  DESCRIPTION OF PROCEDURE: After the risks benefits and alternatives of the procedure were thoroughly explained, informed consent was obtained.  The Pentax Gastroscope [id number] endoscope was introduced through the mouth and advanced to the third portion of the duodenum , Without limitations.  The instrument was slowly withdrawn as the mucosa was fully examined.    DUODENUM: Moderate duodenal inflammation was found in the bulb and second portion of the duodenum. There were enlarged, firm folds. Biopsies were taken.    ESOPHAGUS: There was a 7cm segment of suspected Barrett's esophagus found 33 cm from the incisors.  The length of circumferential Barrett's was 7cm ([place] C7).  There was no nodular mucosa noted in the Barrett's segment.  Multiple biopsies were performed using cold forceps. There was a 5 cm sliding hiatal hernia.The remainder the exam was normal.  There was no fresh or old blood. Retroflexed views revealed no abnormalities. The scope was then withdrawn from the patient and the procedure completed.  COMPLICATIONS: There were no immediate complications.  ENDOSCOPIC IMPRESSION: 1.  probable Barrett's esophagus 2.  Enlarged duodenal folds 2.  Hiatal hernia  No bleeding source was identified by upper endoscopy  RECOMMENDATIONS: Await biopsy results capsule endoscopy as an outpatient  REPEAT EXAM:  eSigned:  Inda Castle, MD 09/17/2014 12:05 PM    CC: Darylene Price, MD  PATIENT NAME:  Debbie Delacruz, Debbie Delacruz MR#:  [id number]

## 2014-09-17 NOTE — Progress Notes (Signed)
Patient: Debbie Delacruz / Admit Date: 09/13/2014 / Date of Encounter: 09/17/2014, 7:46 AM   Subjective: No CP, SOB, dizziness or recurrent syncope. She banged her left knee on the bedside table yesterday and has some knee swelling.   Objective: Telemetry: NSR Physical Exam: Blood pressure 115/62, pulse 68, temperature 98.9 F (37.2 C), temperature source Oral, resp. rate 18, height 5\' 2"  (1.575 m), weight 179 lb 5.2 oz (81.34 kg), SpO2 96 %. General: Well developed, well nourished WF in no acute distress. Head: Normocephalic, atraumatic, sclera non-icteric, no xanthomas, nares are without discharge. Neck: Negative for carotid bruits. JVP not elevated. Lungs: Clear bilaterally to auscultation without wheezes, rales, or rhonchi. Breathing is unlabored. Heart: RRR S1 S2 without murmurs, rubs, or gallops.  Abdomen: Soft, non-tender, non-distended with normoactive bowel sounds. No rebound/guarding. Extremities: No clubbing or cyanosis. No edema, except for mild left knee swelling, no obvious ecchymosis, + tender to palpation. Distal pedal pulses are 2+ and equal bilaterally. Neuro: Alert and oriented X 3. Moves all extremities spontaneously. Psych:  Responds to questions appropriately with a normal affect.   Intake/Output Summary (Last 24 hours) at 09/17/14 0746 Last data filed at 09/17/14 0427  Gross per 24 hour  Intake    450 ml  Output   1050 ml  Net   -600 ml    Inpatient Medications:  . amLODipine  5 mg Oral Daily  . aspirin EC  81 mg Oral Daily  . FLUoxetine  40 mg Oral Daily  . iron dextran (INFED/DEXFERRUM) infusion  500 mg Intravenous Q24H  . isosorbide mononitrate  30 mg Oral Daily  . metoprolol succinate  75 mg Oral Daily  . pantoprazole  40 mg Oral Daily  . rosuvastatin  40 mg Oral q1800   Infusions:    Labs: No results for input(s): NA, K, CL, CO2, GLUCOSE, BUN, CREATININE, CALCIUM, MG, PHOS in the last 72 hours. No results for input(s): AST, ALT, ALKPHOS,  BILITOT, PROT, ALBUMIN in the last 72 hours.  Recent Labs  09/15/14 0315 09/16/14 0320  WBC 5.7  --   HGB 8.6* 8.4*  HCT 30.4* 29.9*  MCV 67.9*  --   PLT 257  --    No results for input(s): CKTOTAL, CKMB, TROPONINI in the last 72 hours. Invalid input(s): POCBNP No results for input(s): HGBA1C in the last 72 hours.   Radiology/Studies:  Dg Chest Port 1 View  09/13/2014   CLINICAL DATA:  Chest pain.  Loss of consciousness.  EXAM: PORTABLE CHEST - 1 VIEW  COMPARISON:  August 29, 2012.  FINDINGS: The heart size and mediastinal contours are within normal limits. Both lungs are clear. No pneumothorax or pleural effusion is noted. The visualized skeletal structures are unremarkable.  IMPRESSION: No acute cardiopulmonary abnormality seen   Electronically Signed   By: Marijo Conception, M.D.   On: 09/13/2014 14:56   Dg Knee Left Port  09/17/2014   CLINICAL DATA:  Acute onset of anterior left knee pain, inferior to the patella. Initial encounter.  EXAM: PORTABLE LEFT KNEE - 1-2 VIEW  COMPARISON:  None.  FINDINGS: There is no evidence of fracture or dislocation. Mild joint space narrowing is noted at the patellofemoral compartment. Small marginal osteophytes are seen arising at the lateral and patellofemoral compartments.  Trace knee joint fluid likely remains within normal limits. Scattered vascular calcifications are seen.  IMPRESSION: 1. No evidence of fracture or dislocation. 2. Mild osteoarthritis noted at the left knee. 3. Scattered vascular calcifications  seen.   Electronically Signed   By: Garald Balding M.D.   On: 09/17/2014 05:32     Assessment and Plan  1. Syncope, question due to neurocardiogenic cause versus cardiac arrhythmia - 2D echo with normal LV function, RMWA could not be excluded, mild MR - no further events  2. Severe three-vessel CAD  - tentative plans for CABG on 4/26 pending outcome of EGD - continue aspirin, BB, statin, Imdur  3. Recent history of GIB with microcytic  anemia - required transfusions in January/Feb 2016 - FOBT + on 4/23 but no gross bleeding or melena - endoscopy planned for today, GI on board  4. HTN - continue current regimen  5. Hyperlipidemia - continue Crestor  6. Knee pain - further per IM  Signed, Melina Copa PA-C Pager: [phone]    Agree with assessment and plan. No further chest pain. Sinus rhythm at 64. EGD with probable Barrett's esophagus, enlarged duodenal folds and hiatal hernia without fresh or old blood.  For probable CABG tomorrow.   Troy Sine, MD, [facility] 09/17/2014 2:20 PM

## 2014-09-17 NOTE — Progress Notes (Signed)
[facility] BRIEF PROGRESS NOTE   Results of EGD noted. We plan CABG in am tomorrow. I have again reviewed the indications, risks and potential benefits of surgery. All questions answered.  Debbie Delacruz 09/17/2014 6:49 PM

## 2014-09-17 NOTE — Progress Notes (Signed)
Patient Demographics  Debbie Delacruz, is a 69 y.o. female, DOB - 1946/05/14, JSH:[id number]  Admit date - 09/13/2014   Admitting Physician Hosie Poisson, MD  Outpatient Primary MD for the patient is [name], MD  LOS - 4   Chief Complaint  Patient presents with  . Chest Pain  . Loss of Consciousness        Subjective:   Debbie Delacruz today has, No headache, No chest pain, No abdominal pain - No Nausea, No new weakness tingling or numbness, No Cough - SOB.  No dizziness remains symptom-free. She bumped her left knee on the side of the table on 09/16/2014 night with smile amount of swelling and discomfort.   Assessment & Plan    1. Syncope with chest pain in a patient who was scheduled for CABG in 2 weeks. Currently chest pain-free, highly suspicious for cardiac dysrhythmia, currently on telemetry cardiology on board, on aspirin, statin, beta blocker and Imdur. Stable Echogram & carotid duplex. Seen by cardiothoracic surgery and CABG scheduled for 09/18/2014.   2. Hx of anemia. Apparently required transfusions in January, her and her hemoglobin at 8. This does not account for her syncope. He has had an unremarkable colonoscopy by Dr. Fuller Plan. Cardiothoracic surgery wants an EGD this admission due to her underlying esophagitis. GI on board and due for EGD later on 09/17/2014, continue PPI. Current H&H remain stable in fact is going up. For her iron deficiency she received IV iron on 09/15/2014 dose by pharmacy.   3. Carotid bruits. Carotid duplex stable.    4. Dyslipidemia. Continue statin.    5. GERD. On PPI.    6. Essential hypertension continue combination of beta blocker, Norvasc and Imdur.    7. Left knee. Small traumatic injury, x-ray stable, minimal effusion. Ice  packs and supportive care. Cannot use NSAIDs due to #1 and #2. Will give her a few doses of Colchicine for an anti-inflammatory effect.    Code Status: Full  Family Communication: Husband bedside  Disposition Plan: Home   Procedures    Echogram   - Left ventricle: The cavity size was normal. There was moderateconcentric hypertrophy. Systolic function was normal. The estimated ejection fraction was in the range of 55% to 60%.Regional wall motion abnormalities cannot be excluded. - Mitral valve: There was mild regurgitation. - Left atrium: The atrium was mildly dilated.   Carotid Dopplers -  Preliminary report: 1-39% ICA stenosis. Vertebral artery flow is antegrade.    EGD later today on 09/17/2014   CABG 09/18/2014   Consults  Cards, CTVS, [facility]   Medications  Scheduled Meds: . [MAR Hold] amLODipine  5 mg Oral Daily  . [MAR Hold] aspirin EC  81 mg Oral Daily  . [MAR Hold] colchicine  0.6 mg Oral BID  . [MAR Hold] FLUoxetine  40 mg Oral Daily  . [MAR Hold] iron dextran (INFED/DEXFERRUM) infusion  500 mg Intravenous Q24H  . [MAR Hold] isosorbide mononitrate  30 mg Oral Daily  . [MAR Hold] metoprolol succinate  75 mg Oral Daily  . [MAR Hold] pantoprazole  40 mg Oral Daily  . [MAR Hold] rosuvastatin  40 mg Oral q1800   Continuous Infusions:  PRN Meds:.[MAR Hold] acetaminophen  DVT Prophylaxis  Lovenox  Lab Results  Component Value Date   PLT 257 09/15/2014    Antibiotics    Anti-infectives    None          Objective:   Filed Vitals:   09/16/14 0910 09/16/14 1501 09/16/14 2100 09/17/14 0541  BP: 125/55 113/59 101/56 115/62  Pulse: 68 65 62 68  Temp:  98.2 F (36.8 C) 98.6 F (37 C) 98.9 F (37.2 C)  TempSrc:  Oral Oral Oral  Resp:  18 18 18   Height:      Weight:    81.34 kg (179 lb 5.2 oz)  SpO2:  97% 98% 96%    Wt Readings from Last 3 Encounters:  09/17/14 81.34 kg (179 lb 5.2 oz)  09/11/14 81.194 kg (179 lb)  09/11/14  81.194 kg (179 lb)     Intake/Output Summary (Last 24 hours) at 09/17/14 1014 Last data filed at 09/17/14 0427  Gross per 24 hour  Intake    250 ml  Output    600 ml  Net   -350 ml     Physical Exam  Awake Alert, Oriented X 3, No new F.N deficits, Normal affect [place].AT,PERRAL Supple Neck,No JVD, No cervical lymphadenopathy appriciated.  Symmetrical Chest wall movement, Good air movement bilaterally, CTAB RRR,No Gallops,Rubs or new Murmurs, No Parasternal Heave +ve B.Sounds, Abd Soft, No tenderness, No organomegaly appriciated, No rebound - guarding or rigidity. No Cyanosis, Clubbing or edema, No new Rash or bruise     Data Review   Micro Results No results found for this or any previous visit (from the past 240 hour(s)).  Radiology Reports Dg Chest Port 1 View  09/13/2014   CLINICAL DATA:  Chest pain.  Loss of consciousness.  EXAM: PORTABLE CHEST - 1 VIEW  COMPARISON:  August 29, 2012.  FINDINGS: The heart size and mediastinal contours are within normal limits. Both lungs are clear. No pneumothorax or pleural effusion is noted. The visualized skeletal structures are unremarkable.  IMPRESSION: No acute cardiopulmonary abnormality seen   Electronically Signed   By: Marijo Conception, M.D.   On: 09/13/2014 14:56   Dg Knee Left Port  09/17/2014   CLINICAL DATA:  Acute onset of anterior left knee pain, inferior to the patella. Initial encounter.  EXAM: PORTABLE LEFT KNEE - 1-2 VIEW  COMPARISON:  None.  FINDINGS: There is no evidence of fracture or dislocation. Mild joint space narrowing is noted at the patellofemoral compartment. Small marginal osteophytes are seen arising at the lateral and patellofemoral compartments.  Trace knee joint fluid likely remains within normal limits. Scattered vascular calcifications are seen.  IMPRESSION: 1. No evidence of fracture or dislocation. 2. Mild osteoarthritis noted at the left knee. 3. Scattered vascular calcifications seen.   Electronically Signed    By: Garald Balding M.D.   On: 09/17/2014 05:32     CBC  Recent Labs Lab 09/13/14 1500 09/14/14 0624 09/15/14 0315 09/16/14 0320  WBC 9.4 4.9 5.7  --   HGB 8.3* 8.1* 8.6* 8.4*  HCT 29.0* 28.6* 30.4* 29.9*  PLT 263 234 257  --   MCV 67.8* 68.1* 67.9*  --   MCH 19.4* 19.3* 19.2*  --   MCHC 28.6* 28.3* 28.3*  --   RDW 17.5* 17.4* 17.3*  --   LYMPHSABS 0.9  --   --   --   MONOABS 0.6  --   --   --   EOSABS 0.1  --   --   --  BASOSABS 0.0  --   --   --     Chemistries   Recent Labs Lab 09/13/14 1328  NA 138  K 4.1  CL 105  CO2 23  GLUCOSE 119*  BUN 19  CREATININE 0.86  CALCIUM 9.0   ------------------------------------------------------------------------------------------------------------------ estimated creatinine clearance is 63.6 mL/min (by C-G formula based on Cr of 0.86). ------------------------------------------------------------------------------------------------------------------ No results for input(s): HGBA1C in the last 72 hours. ------------------------------------------------------------------------------------------------------------------ No results for input(s): CHOL, HDL, LDLCALC, TRIG, CHOLHDL, LDLDIRECT in the last 72 hours. ------------------------------------------------------------------------------------------------------------------  Recent Labs  09/16/14 0320  TSH 2.406   ------------------------------------------------------------------------------------------------------------------  Recent Labs  09/14/14 1225  VITAMINB12 262  FOLATE 14.2  FERRITIN 6*  TIBC 417  IRON 12*  RETICCTPCT 1.2    Coagulation profile No results for input(s): INR, PROTIME in the last 168 hours.  No results for input(s): DDIMER in the last 72 hours.  Cardiac Enzymes  Recent Labs Lab 09/13/14 1729 09/13/14 2341 09/14/14 0624  TROPONINI <0.03 <0.03 <0.03    ------------------------------------------------------------------------------------------------------------------ Invalid input(s): POCBNP     Time Spent in minutes   35   Gayle Martinez K M.D on 09/17/2014 at 10:14 AM  Between 7am to 7pm - Pager - [phone]  After 7pm go to [email or web address] - password [email or web address]  [facility]   Office  [phone]

## 2014-09-17 NOTE — H&P (View-Only) (Signed)
          Daily Rounding Note  09/16/2014, 11:00 AM  LOS: 3 days   SUBJECTIVE:       Feels well, no sob, weakness, pain.   OBJECTIVE:         Vital signs in last 24 hours:    Temp:  [97.9 F (36.6 C)-99.2 F (37.3 C)] 97.9 F (36.6 C) (04/24 0609) Pulse Rate:  [64-71] 68 (04/24 0910) Resp:  [18] 18 (04/24 0609) BP: (104-125)/(55-84) 125/55 mmHg (04/24 0910) SpO2:  [96 %-99 %] 99 % (04/24 0609) Weight:  [179 lb 4.8 oz (81.33 kg)] 179 lb 4.8 oz (81.33 kg) (04/24 0609) Last BM Date: 09/15/14 Filed Weights   09/14/14 0550 09/15/14 0530 09/16/14 0609  Weight: 180 lb 6.4 oz (81.829 kg) 178 lb 9.6 oz (81.012 kg) 179 lb 4.8 oz (81.33 kg)   General: looks rested, comfortable.  Not ill.   Heart: RRR Chest: clear bil.   No SOB or cough Abdomen: soft, NT, ND.  No mass or HSM.  Active BS  Extremities: no CCE  Neuro/Psych:  Pleasant, relaxed.  No gross deficits.   Intake/Output from previous day: 04/23 0701 - 04/24 0700 In: 720 [P.O.:720] Out: 1000 [Urine:1000]  Intake/Output this shift: Total I/O In: 200 [P.O.:200] Out: 450 [Urine:450]  Lab Results:  Recent Labs  09/13/14 1500 09/14/14 0624 09/15/14 0315 09/16/14 0320  WBC 9.4 4.9 5.7  --   HGB 8.3* 8.1* 8.6* 8.4*  HCT 29.0* 28.6* 30.4* 29.9*  PLT 263 234 257  --    BMET  Recent Labs  09/13/14 1328  NA 138  K 4.1  CL 105  CO2 23  GLUCOSE 119*  BUN 19  CREATININE 0.86  CALCIUM 9.0   Scheduled Meds: . amLODipine  5 mg Oral Daily  . aspirin EC  81 mg Oral Daily  . FLUoxetine  40 mg Oral Daily  . iron dextran (INFED/DEXFERRUM) infusion  500 mg Intravenous Q24H  . isosorbide mononitrate  30 mg Oral Daily  . metoprolol succinate  75 mg Oral Daily  . [START ON 09/17/2014] pantoprazole  40 mg Oral Daily  . rosuvastatin  40 mg Oral q1800   Continuous Infusions:  PRN Meds:.acetaminophen   ASSESMENT:   * Microcytic anemia. Treated with 2 units packed red  blood cells and parenteral iron in January/February 2016. On chronic twice a day iron since then.  Daily doses x 3 of Infed just initiated.  stool of 4/23 tested FOBT +.  There has been no gross bleeding or melena.    * Barium esophagram 07/2014 showing esophagitis, hiatal hernia, GER. Though she is heme negative, wonder if the hiatal hernia is large enough to be causing mechanical irritation and associated blood loss. Patient's H2 blocker/ranitidine has been replaced with oral Protonix.  * History adenomatous polyps dating back to 1998. Surveillance colonoscopy in 2014 showed only left sided diverticulosis and internal hemorrhoids.  * LAD/three-vessel disease equivalent. With her recent syncope, V date 4 CABG moved up to 5/26.  * OSA. Doesn't use CPAP at night due to claustrophobia.    PLAN   *  EGD tomorrow.  Will stop Lovenox in anticipation of this, so not injection tonight.  Daily Protonix.     Azucena Freed  09/16/2014, 11:00 AM Pager: [phone]

## 2014-09-17 NOTE — Transfer of Care (Signed)
Immediate Anesthesia Transfer of Care Note  Patient: Debbie Delacruz  Procedure(s) Performed: Procedure(s): ESOPHAGOGASTRODUODENOSCOPY (EGD) (N/A)  Patient Location: Endoscopy Unit  Anesthesia Type:MAC  Level of Consciousness: awake, alert  and sedated  Airway & Oxygen Therapy: Patient connected to nasal cannula oxygen  Post-op Assessment: Report given to RN  Post vital signs: stable  Last Vitals:  Filed Vitals:   09/17/14 1135  BP: 179/106  Pulse:   Temp:   Resp: 14    Complications: No apparent anesthesia complications

## 2014-09-17 NOTE — Progress Notes (Addendum)
      [address]       [place], [address]             [phone]     CARDIOTHORACIC SURGERY PROGRESS NOTE  Subjective: No complaints except pain in left knee, which is quite sore.  No chest pain over the weekend.    Objective: Vital signs in last 24 hours: Temp:  [98.2 F (36.8 C)-98.9 F (37.2 C)] 98.9 F (37.2 C) (04/25 0541) Pulse Rate:  [62-68] 68 (04/25 0541) Cardiac Rhythm:  [-] Normal sinus rhythm (04/25 0813) Resp:  [18] 18 (04/25 0541) BP: (101-115)/(56-62) 115/62 mmHg (04/25 0541) SpO2:  [96 %-98 %] 96 % (04/25 0541) Weight:  [81.34 kg (179 lb 5.2 oz)] 81.34 kg (179 lb 5.2 oz) (04/25 0541)  Physical Exam:  Rhythm:   sinus  Breath sounds: clear  Heart sounds:  RRR  Incisions:  n/a  Abdomen:  soft  Extremities:  Warm, well-perfused    Intake/Output from previous day: 04/24 0701 - 04/25 0700 In: 450 [P.O.:200; IV Piggyback:250] Out: 1050 [Urine:1050] Intake/Output this shift:    Lab Results:  Recent Labs  09/15/14 0315 09/16/14 0320  WBC 5.7  --   HGB 8.6* 8.4*  HCT 30.4* 29.9*  PLT 257  --    BMET: No results for input(s): NA, K, CL, CO2, GLUCOSE, BUN, CREATININE, CALCIUM in the last 72 hours.  CBG (last 3)  No results for input(s): GLUCAP in the last 72 hours. PT/INR:  No results for input(s): LABPROT, INR in the last 72 hours.  CXR:  N/A  PORTABLE LEFT KNEE - 1-2 VIEW  COMPARISON: None.  FINDINGS: There is no evidence of fracture or dislocation. Mild joint space narrowing is noted at the patellofemoral compartment. Small marginal osteophytes are seen arising at the lateral and patellofemoral compartments.  Trace knee joint fluid likely remains within normal limits. Scattered vascular calcifications are seen.  IMPRESSION: 1. No evidence of fracture or dislocation. 2. Mild osteoarthritis noted at the left knee. 3. Scattered vascular calcifications seen.   Electronically Signed  By: Garald Balding  M.D.  On: 09/17/2014 05:32  Assessment/Plan:  Hemeoccult positive stool w/ GI bleed requiring transfusion this January.  Hgb stable since admission.  For EGD today.  Severe 3-vessel CAD w/ unstable angina, brief syncopal episode prior to admission.  Will tentatively plan for CABG tomorrow pending results of EGD.  Left knee injury over the weekend - currently icing the knee.  Single view x-ray unrevealing.  This may affect the patient's ability to mobilize post-operatively.  Needs PT consult.  I spent in excess of 15 minutes during the conduct of this hospital encounter and >50% of this time involved direct face-to-face encounter with the patient for counseling and/or coordination of their care.         [name] 09/17/2014 9:23 AM

## 2014-09-17 NOTE — Anesthesia Preprocedure Evaluation (Addendum)
Anesthesia Evaluation  Patient identified by MRN, date of birth, ID band Patient awake    Reviewed: Allergy & Precautions, NPO status , Patient's Chart, lab work & pertinent test results  History of Anesthesia Complications (+) PONV  Airway Mallampati: II  TM Distance: >3 FB Neck ROM: Full    Dental   Pulmonary sleep apnea ,  breath sounds clear to auscultation        Cardiovascular hypertension, + angina at rest + CAD and + Past MI Rhythm:Regular Rate:Normal  Severe 3v CAD for CABG 4/26  NL LV function- EF 55%.   Neuro/Psych  Headaches, Anxiety    GI/Hepatic Neg liver ROS, hiatal hernia, GERD-  ,  Endo/Other  Morbid obesity  Renal/GU      Musculoskeletal negative musculoskeletal ROS (+)   Abdominal   Peds  Hematology  (+) anemia , Hgb 8.4   Anesthesia Other Findings Scheduled for CABG  With Dr. Roxy Manns 4/26.    Reproductive/Obstetrics                          Anesthesia Physical Anesthesia Plan  ASA: IV  Anesthesia Plan: MAC   Post-op Pain Management:    Induction: Intravenous  Airway Management Planned: Simple Face Mask and Nasal Cannula  Additional Equipment:   Intra-op Plan:   Post-operative Plan:   Informed Consent: I have reviewed the patients History and Physical, chart, labs and discussed the procedure including the risks, benefits and alternatives for the proposed anesthesia with the patient or authorized representative who has indicated his/her understanding and acceptance.   Dental advisory given  Plan Discussed with: CRNA  Anesthesia Plan Comments:         Anesthesia Quick Evaluation

## 2014-09-17 NOTE — Progress Notes (Signed)
Came earlier and pt c/o knee pain and didn't feel she could walk right then. Returned again and she is in respiratory but husband sts her knee is still painful and she has not been able to walk in the room much today. Will hold ambulation today. Yves Dill CES, ACSM 2:11 PM 09/17/2014

## 2014-09-17 NOTE — Interval H&P Note (Signed)
History and Physical Interval Note:  09/17/2014 11:31 AM  Debbie Delacruz  has presented today for surgery, with the diagnosis of microcytic anemia.  The various methods of treatment have been discussed with the patient and family. After consideration of risks, benefits and other options for treatment, the patient has consented to  Procedure(s): ESOPHAGOGASTRODUODENOSCOPY (EGD) (N/A) as a surgical intervention .  The patient's history has been reviewed, patient examined, no change in status, stable for surgery.  I have reviewed the patient's chart and labs.  Questions were answered to the patient's satisfaction.    The recent H&P (dated *09/16/14**) was reviewed, the patient was examined and there is no change in the patients condition since that H&P was completed.   [name]  09/17/2014, 11:31 AM    [name]

## 2014-09-17 NOTE — Anesthesia Postprocedure Evaluation (Signed)
  Anesthesia Post-op Note  Patient: Debbie Delacruz  Procedure(s) Performed: Procedure(s): ESOPHAGOGASTRODUODENOSCOPY (EGD) (N/A)  Patient Location: PACU  Anesthesia Type:MAC  Level of Consciousness: awake and alert   Airway and Oxygen Therapy: Patient Spontanous Breathing  Post-op Pain: none  Post-op Assessment: Post-op Vital signs reviewed  Post-op Vital Signs: Reviewed  Last Vitals:  Filed Vitals:   09/17/14 1209  BP:   Pulse:   Temp: 36.9 C  Resp:     Complications: No apparent anesthesia complications

## 2014-09-17 NOTE — Progress Notes (Signed)
No bleeding source was seen at upper endoscopy.  She appears to have Barrett's esophagus and duodenitis.  Recommendations #1 continue PPI therapy #2 capsule study as an outpatient procedure  Signing off

## 2014-09-17 NOTE — Progress Notes (Signed)
Pt complains of L knee pain. She states she hit it yesterday on the bedside table. Her knee is swollen and painful to palpation.  She states is hurts when she walks on it.  Tylene Fantasia NP notified and orders received. Ice pack applied and PRN tylenol given will continue to monitor Jessie Foot, RN

## 2014-09-18 ENCOUNTER — Observation Stay (HOSPITAL_COMMUNITY): Payer: Medicare Other | Admitting: Certified Registered Nurse Anesthetist

## 2014-09-18 ENCOUNTER — Encounter (HOSPITAL_COMMUNITY): Admission: RE | Payer: Self-pay | Source: Ambulatory Visit

## 2014-09-18 ENCOUNTER — Inpatient Hospital Stay (HOSPITAL_COMMUNITY): Payer: Medicare Other

## 2014-09-18 ENCOUNTER — Inpatient Hospital Stay (HOSPITAL_COMMUNITY)
Admission: RE | Admit: 2014-09-18 | Payer: Medicare Other | Source: Ambulatory Visit | Admitting: Thoracic Surgery (Cardiothoracic Vascular Surgery)

## 2014-09-18 ENCOUNTER — Encounter (HOSPITAL_COMMUNITY): Payer: Self-pay | Admitting: Gastroenterology

## 2014-09-18 ENCOUNTER — Encounter: Payer: Self-pay | Admitting: Gastroenterology

## 2014-09-18 ENCOUNTER — Encounter (HOSPITAL_COMMUNITY)
Admission: EM | Disposition: A | Discharge status: Home / Self Care | Payer: Medicare Other | Source: Home / Self Care | Attending: Thoracic Surgery (Cardiothoracic Vascular Surgery)

## 2014-09-18 DIAGNOSIS — I25110 Atherosclerotic heart disease of native coronary artery with unstable angina pectoris: Secondary | ICD-10-CM | POA: Diagnosis not present

## 2014-09-18 DIAGNOSIS — E8770 Fluid overload, unspecified: Secondary | ICD-10-CM | POA: Diagnosis not present

## 2014-09-18 DIAGNOSIS — D62 Acute posthemorrhagic anemia: Secondary | ICD-10-CM | POA: Diagnosis not present

## 2014-09-18 DIAGNOSIS — D500 Iron deficiency anemia secondary to blood loss (chronic): Secondary | ICD-10-CM | POA: Diagnosis present

## 2014-09-18 DIAGNOSIS — Z79899 Other long term (current) drug therapy: Secondary | ICD-10-CM | POA: Diagnosis not present

## 2014-09-18 DIAGNOSIS — K2980 Duodenitis without bleeding: Secondary | ICD-10-CM | POA: Diagnosis present

## 2014-09-18 DIAGNOSIS — E785 Hyperlipidemia, unspecified: Secondary | ICD-10-CM | POA: Diagnosis present

## 2014-09-18 DIAGNOSIS — Z825 Family history of asthma and other chronic lower respiratory diseases: Secondary | ICD-10-CM | POA: Diagnosis not present

## 2014-09-18 DIAGNOSIS — F419 Anxiety disorder, unspecified: Secondary | ICD-10-CM | POA: Diagnosis present

## 2014-09-18 DIAGNOSIS — I252 Old myocardial infarction: Secondary | ICD-10-CM | POA: Diagnosis not present

## 2014-09-18 DIAGNOSIS — Z8249 Family history of ischemic heart disease and other diseases of the circulatory system: Secondary | ICD-10-CM | POA: Diagnosis not present

## 2014-09-18 DIAGNOSIS — Z951 Presence of aortocoronary bypass graft: Secondary | ICD-10-CM

## 2014-09-18 DIAGNOSIS — K2270 Barrett's esophagus without dysplasia: Secondary | ICD-10-CM | POA: Diagnosis present

## 2014-09-18 DIAGNOSIS — I10 Essential (primary) hypertension: Secondary | ICD-10-CM | POA: Diagnosis not present

## 2014-09-18 DIAGNOSIS — M25462 Effusion, left knee: Secondary | ICD-10-CM | POA: Diagnosis not present

## 2014-09-18 DIAGNOSIS — R55 Syncope and collapse: Secondary | ICD-10-CM | POA: Diagnosis present

## 2014-09-18 DIAGNOSIS — I200 Unstable angina: Secondary | ICD-10-CM | POA: Diagnosis present

## 2014-09-18 DIAGNOSIS — Z955 Presence of coronary angioplasty implant and graft: Secondary | ICD-10-CM | POA: Diagnosis not present

## 2014-09-18 DIAGNOSIS — G4733 Obstructive sleep apnea (adult) (pediatric): Secondary | ICD-10-CM | POA: Diagnosis present

## 2014-09-18 DIAGNOSIS — K210 Gastro-esophageal reflux disease with esophagitis: Secondary | ICD-10-CM | POA: Diagnosis present

## 2014-09-18 DIAGNOSIS — Z7982 Long term (current) use of aspirin: Secondary | ICD-10-CM | POA: Diagnosis not present

## 2014-09-18 HISTORY — DX: Presence of aortocoronary bypass graft: Z95.1

## 2014-09-18 HISTORY — PX: TEE WITHOUT CARDIOVERSION: SHX5443

## 2014-09-18 HISTORY — PX: CORONARY ARTERY BYPASS GRAFT: SHX141

## 2014-09-18 LAB — POCT I-STAT 3, ART BLOOD GAS (G3+)
Acid-base deficit: 5 mmol/L — ABNORMAL HIGH (ref 0.0–2.0)
Acid-base deficit: 5 mmol/L — ABNORMAL HIGH (ref 0.0–2.0)
Acid-base deficit: 5 mmol/L — ABNORMAL HIGH (ref 0.0–2.0)
Acid-base deficit: 5 mmol/L — ABNORMAL HIGH (ref 0.0–2.0)
Bicarbonate: 25 mEq/L — ABNORMAL HIGH (ref 20.0–24.0)
Bicarbonate: 20.4 mEq/L (ref 20.0–24.0)
Bicarbonate: 19.5 mEq/L — ABNORMAL LOW (ref 20.0–24.0)
Bicarbonate: 21.3 mEq/L (ref 20.0–24.0)
Bicarbonate: 21.4 mEq/L (ref 20.0–24.0)
O2 SAT: 100 %
O2 SAT: 97 %
O2 SAT: 95 %
O2 Saturation: 98 %
O2 Saturation: 95 %
PCO2 ART: 39.1 mmHg (ref 35.0–45.0)
PCO2 ART: 33.7 mmHg — AB (ref 35.0–45.0)
PCO2 ART: 42.2 mmHg (ref 35.0–45.0)
PH ART: 7.31 — AB (ref 7.350–7.450)
PO2 ART: 83 mmHg (ref 80.0–100.0)
Patient temperature: 36.8
Patient temperature: 36.5
Patient temperature: 36.3
TCO2: 26 mmol/L (ref 0–100)
TCO2: 22 mmol/L (ref 0–100)
TCO2: 21 mmol/L (ref 0–100)
TCO2: 23 mmol/L (ref 0–100)
TCO2: 23 mmol/L (ref 0–100)
pCO2 arterial: 43.3 mmHg (ref 35.0–45.0)
pCO2 arterial: 45.8 mmHg — ABNORMAL HIGH (ref 35.0–45.0)
pH, Arterial: 7.369 (ref 7.350–7.450)
pH, Arterial: 7.324 — ABNORMAL LOW (ref 7.350–7.450)
pH, Arterial: 7.368 (ref 7.350–7.450)
pH, Arterial: 7.274 — ABNORMAL LOW (ref 7.350–7.450)
pO2, Arterial: 258 mmHg — ABNORMAL HIGH (ref 80.0–100.0)
pO2, Arterial: 99 mmHg (ref 80.0–100.0)
pO2, Arterial: 114 mmHg — ABNORMAL HIGH (ref 80.0–100.0)
pO2, Arterial: 78 mmHg — ABNORMAL LOW (ref 80.0–100.0)

## 2014-09-18 LAB — POCT I-STAT, CHEM 8
BUN: 13 mg/dL (ref 6–23)
BUN: 11 mg/dL (ref 6–23)
BUN: 10 mg/dL (ref 6–23)
BUN: 11 mg/dL (ref 6–23)
CALCIUM ION: 1.19 mmol/L (ref 1.13–1.30)
CALCIUM ION: 1.28 mmol/L (ref 1.13–1.30)
CHLORIDE: 105 mmol/L (ref 96–112)
CHLORIDE: 104 mmol/L (ref 96–112)
CREATININE: 0.6 mg/dL (ref 0.50–1.10)
Calcium, Ion: 1.06 mmol/L — ABNORMAL LOW (ref 1.13–1.30)
Calcium, Ion: 1.08 mmol/L — ABNORMAL LOW (ref 1.13–1.30)
Chloride: 106 mmol/L (ref 96–112)
Chloride: 110 mmol/L (ref 96–112)
Creatinine, Ser: 0.6 mg/dL (ref 0.50–1.10)
Creatinine, Ser: 0.6 mg/dL (ref 0.50–1.10)
Creatinine, Ser: 0.7 mg/dL (ref 0.50–1.10)
GLUCOSE: 105 mg/dL — AB (ref 70–99)
GLUCOSE: 120 mg/dL — AB (ref 70–99)
Glucose, Bld: 140 mg/dL — ABNORMAL HIGH (ref 70–99)
Glucose, Bld: 145 mg/dL — ABNORMAL HIGH (ref 70–99)
HCT: 23 % — ABNORMAL LOW (ref 36.0–46.0)
HEMATOCRIT: 28 % — AB (ref 36.0–46.0)
HEMATOCRIT: 29 % — AB (ref 36.0–46.0)
HEMATOCRIT: 27 % — AB (ref 36.0–46.0)
Hemoglobin: 9.5 g/dL — ABNORMAL LOW (ref 12.0–15.0)
Hemoglobin: 9.9 g/dL — ABNORMAL LOW (ref 12.0–15.0)
Hemoglobin: 9.2 g/dL — ABNORMAL LOW (ref 12.0–15.0)
Hemoglobin: 7.8 g/dL — ABNORMAL LOW (ref 12.0–15.0)
POTASSIUM: 4.1 mmol/L (ref 3.5–5.1)
POTASSIUM: 4.4 mmol/L (ref 3.5–5.1)
Potassium: 4.1 mmol/L (ref 3.5–5.1)
Potassium: 4.3 mmol/L (ref 3.5–5.1)
SODIUM: 139 mmol/L (ref 135–145)
Sodium: 141 mmol/L (ref 135–145)
Sodium: 142 mmol/L (ref 135–145)
Sodium: 139 mmol/L (ref 135–145)
TCO2: 22 mmol/L (ref 0–100)
TCO2: 22 mmol/L (ref 0–100)
TCO2: 20 mmol/L (ref 0–100)
TCO2: 20 mmol/L (ref 0–100)

## 2014-09-18 LAB — PROTIME-INR
INR: 1.14 (ref 0.00–1.49)
INR: 1.71 — ABNORMAL HIGH (ref 0.00–1.49)
PROTHROMBIN TIME: 14.8 s (ref 11.6–15.2)
Prothrombin Time: 20.2 seconds — ABNORMAL HIGH (ref 11.6–15.2)

## 2014-09-18 LAB — COMPREHENSIVE METABOLIC PANEL
ALBUMIN: 3.1 g/dL — AB (ref 3.5–5.2)
ALT: 10 U/L (ref 0–35)
AST: 15 U/L (ref 0–37)
Alkaline Phosphatase: 55 U/L (ref 39–117)
Anion gap: 10 (ref 5–15)
BUN: 14 mg/dL (ref 6–23)
CO2: 22 mmol/L (ref 19–32)
Calcium: 8.5 mg/dL (ref 8.4–10.5)
Chloride: 108 mmol/L (ref 96–112)
Creatinine, Ser: 0.88 mg/dL (ref 0.50–1.10)
GFR calc Af Amer: 78 mL/min — ABNORMAL LOW (ref >90)
GFR, EST NON AFRICAN AMERICAN: 67 mL/min — AB (ref >90)
Glucose, Bld: 114 mg/dL — ABNORMAL HIGH (ref 70–99)
Potassium: 4 mmol/L (ref 3.5–5.1)
SODIUM: 140 mmol/L (ref 135–145)
TOTAL PROTEIN: 5.8 g/dL — AB (ref 6.0–8.3)
Total Bilirubin: 0.7 mg/dL (ref 0.3–1.2)

## 2014-09-18 LAB — POCT I-STAT 4, (NA,K, GLUC, HGB,HCT)
Glucose, Bld: 112 mg/dL — ABNORMAL HIGH (ref 70–99)
HEMATOCRIT: 26 % — AB (ref 36.0–46.0)
Hemoglobin: 8.8 g/dL — ABNORMAL LOW (ref 12.0–15.0)
Potassium: 3.8 mmol/L (ref 3.5–5.1)
SODIUM: 140 mmol/L (ref 135–145)

## 2014-09-18 LAB — GLUCOSE, CAPILLARY
GLUCOSE-CAPILLARY: 95 mg/dL (ref 70–99)
GLUCOSE-CAPILLARY: 114 mg/dL — AB (ref 70–99)
Glucose-Capillary: 105 mg/dL — ABNORMAL HIGH (ref 70–99)
Glucose-Capillary: 107 mg/dL — ABNORMAL HIGH (ref 70–99)
Glucose-Capillary: 115 mg/dL — ABNORMAL HIGH (ref 70–99)
Glucose-Capillary: 101 mg/dL — ABNORMAL HIGH (ref 70–99)
Glucose-Capillary: 142 mg/dL — ABNORMAL HIGH (ref 70–99)
Glucose-Capillary: 100 mg/dL — ABNORMAL HIGH (ref 70–99)

## 2014-09-18 LAB — CBC
HCT: 29.6 % — ABNORMAL LOW (ref 36.0–46.0)
HCT: 26.6 % — ABNORMAL LOW (ref 36.0–46.0)
HEMATOCRIT: 25.3 % — AB (ref 36.0–46.0)
HEMOGLOBIN: 8 g/dL — AB (ref 12.0–15.0)
Hemoglobin: 8.5 g/dL — ABNORMAL LOW (ref 12.0–15.0)
Hemoglobin: 7.5 g/dL — ABNORMAL LOW (ref 12.0–15.0)
MCH: 19.6 pg — ABNORMAL LOW (ref 26.0–34.0)
MCH: 21.9 pg — AB (ref 26.0–34.0)
MCH: 21.5 pg — ABNORMAL LOW (ref 26.0–34.0)
MCHC: 28.7 g/dL — ABNORMAL LOW (ref 30.0–36.0)
MCHC: 30.1 g/dL (ref 30.0–36.0)
MCHC: 29.6 g/dL — ABNORMAL LOW (ref 30.0–36.0)
MCV: 68.2 fL — ABNORMAL LOW (ref 78.0–100.0)
MCV: 72.7 fL — ABNORMAL LOW (ref 78.0–100.0)
MCV: 72.5 fL — AB (ref 78.0–100.0)
PLATELETS: 183 10*3/uL (ref 150–400)
Platelets: 217 10*3/uL (ref 150–400)
Platelets: 163 10*3/uL (ref 150–400)
RBC: 4.34 MIL/uL (ref 3.87–5.11)
RBC: 3.66 MIL/uL — ABNORMAL LOW (ref 3.87–5.11)
RBC: 3.49 MIL/uL — AB (ref 3.87–5.11)
RDW: 17.9 % — AB (ref 11.5–15.5)
RDW: 20.8 % — AB (ref 11.5–15.5)
RDW: 20.8 % — AB (ref 11.5–15.5)
WBC: 8 10*3/uL (ref 4.0–10.5)
WBC: 15.3 10*3/uL — AB (ref 4.0–10.5)
WBC: 17.4 10*3/uL — ABNORMAL HIGH (ref 4.0–10.5)

## 2014-09-18 LAB — MAGNESIUM: Magnesium: 3.2 mg/dL — ABNORMAL HIGH (ref 1.5–2.5)

## 2014-09-18 LAB — SURGICAL PCR SCREEN
MRSA, PCR: NEGATIVE
Staphylococcus aureus: NEGATIVE

## 2014-09-18 LAB — PREPARE RBC (CROSSMATCH)

## 2014-09-18 LAB — HEMOGLOBIN AND HEMATOCRIT, BLOOD
HCT: 25.5 % — ABNORMAL LOW (ref 36.0–46.0)
HEMOGLOBIN: 7.7 g/dL — AB (ref 12.0–15.0)

## 2014-09-18 LAB — CREATININE, SERUM
CREATININE: 0.73 mg/dL (ref 0.50–1.10)
GFR calc Af Amer: >90 mL/min (ref >90)
GFR calc non Af Amer: 87 mL/min — ABNORMAL LOW (ref >90)

## 2014-09-18 LAB — APTT
APTT: 35 s (ref 24–37)
aPTT: 32 seconds (ref 24–37)

## 2014-09-18 LAB — PLATELET COUNT: PLATELETS: 169 10*3/uL (ref 150–400)

## 2014-09-18 SURGERY — CORONARY ARTERY BYPASS GRAFTING (CABG)
Anesthesia: General | Site: Chest

## 2014-09-18 MED ORDER — FAMOTIDINE IN NACL 20-0.9 MG/50ML-% IV SOLN
20.0000 mg | Freq: Two times a day (BID) | INTRAVENOUS | Status: AC
  Administered 2014-09-18: 20 mg via INTRAVENOUS

## 2014-09-18 MED ORDER — TRAMADOL HCL 50 MG PO TABS
50.0000 mg | ORAL_TABLET | ORAL | Status: DC | PRN
  Administered 2014-09-19: 100 mg via ORAL
  Filled 2014-09-18: qty 2

## 2014-09-18 MED ORDER — ARTIFICIAL TEARS OP OINT
TOPICAL_OINTMENT | OPHTHALMIC | Status: AC
  Filled 2014-09-18: qty 3.5

## 2014-09-18 MED ORDER — FENTANYL CITRATE (PF) 250 MCG/5ML IJ SOLN
INTRAMUSCULAR | Status: AC
  Filled 2014-09-18: qty 5

## 2014-09-18 MED ORDER — MORPHINE SULFATE 2 MG/ML IJ SOLN
1.0000 mg | INTRAMUSCULAR | Status: AC | PRN
  Administered 2014-09-18: 2 mg via INTRAVENOUS

## 2014-09-18 MED ORDER — SODIUM CHLORIDE 0.9 % IV SOLN
INTRAVENOUS | Status: DC
  Administered 2014-09-18: 1.8 [IU]/h via INTRAVENOUS
  Filled 2014-09-18 (×2): qty 2.5

## 2014-09-18 MED ORDER — SODIUM CHLORIDE 0.9 % IJ SOLN
INTRAMUSCULAR | Status: AC
  Filled 2014-09-18: qty 10

## 2014-09-18 MED ORDER — INSULIN REGULAR BOLUS VIA INFUSION
0.0000 [IU] | Freq: Three times a day (TID) | INTRAVENOUS | Status: DC
  Filled 2014-09-18: qty 10

## 2014-09-18 MED ORDER — PROPOFOL 10 MG/ML IV BOLUS
INTRAVENOUS | Status: DC | PRN
  Administered 2014-09-18: 30 mg via INTRAVENOUS
  Administered 2014-09-18: 20 mg via INTRAVENOUS

## 2014-09-18 MED ORDER — PROPOFOL 10 MG/ML IV BOLUS
INTRAVENOUS | Status: AC
  Filled 2014-09-18: qty 20

## 2014-09-18 MED ORDER — ALBUMIN HUMAN 5 % IV SOLN
250.0000 mL | INTRAVENOUS | Status: AC | PRN
  Administered 2014-09-18 (×3): 250 mL via INTRAVENOUS
  Filled 2014-09-18: qty 250

## 2014-09-18 MED ORDER — FENTANYL CITRATE (PF) 100 MCG/2ML IJ SOLN
INTRAMUSCULAR | Status: DC | PRN
  Administered 2014-09-18 (×2): 50 ug via INTRAVENOUS
  Administered 2014-09-18: 150 ug via INTRAVENOUS
  Administered 2014-09-18 (×2): 100 ug via INTRAVENOUS
  Administered 2014-09-18: 50 ug via INTRAVENOUS
  Administered 2014-09-18: 150 ug via INTRAVENOUS
  Administered 2014-09-18: 100 ug via INTRAVENOUS
  Administered 2014-09-18: 150 ug via INTRAVENOUS
  Administered 2014-09-18 (×4): 100 ug via INTRAVENOUS
  Administered 2014-09-18: 150 ug via INTRAVENOUS
  Administered 2014-09-18: 100 ug via INTRAVENOUS

## 2014-09-18 MED ORDER — CHLORHEXIDINE GLUCONATE 0.12 % MT SOLN
15.0000 mL | Freq: Two times a day (BID) | OROMUCOSAL | Status: DC
  Administered 2014-09-18 – 2014-09-19 (×2): 15 mL via OROMUCOSAL
  Filled 2014-09-18 (×2): qty 15

## 2014-09-18 MED ORDER — SODIUM CHLORIDE 0.9 % IV SOLN: INTRAVENOUS | Status: DC

## 2014-09-18 MED ORDER — LIDOCAINE HCL (CARDIAC) 20 MG/ML IV SOLN
INTRAVENOUS | Status: DC | PRN
  Administered 2014-09-18: 70 mg via INTRAVENOUS

## 2014-09-18 MED ORDER — BISACODYL 5 MG PO TBEC
10.0000 mg | DELAYED_RELEASE_TABLET | Freq: Every day | ORAL | Status: DC
  Administered 2014-09-19 – 2014-09-23 (×3): 10 mg via ORAL
  Filled 2014-09-18 (×2): qty 2

## 2014-09-18 MED ORDER — AMINOCAPROIC ACID 250 MG/ML IV SOLN
10.0000 g | INTRAVENOUS | Status: DC | PRN
  Administered 2014-09-18: 5 g/h via INTRAVENOUS

## 2014-09-18 MED ORDER — PANTOPRAZOLE SODIUM 40 MG PO TBEC: 40.0000 mg | DELAYED_RELEASE_TABLET | Freq: Every day | ORAL | Status: DC

## 2014-09-18 MED ORDER — VECURONIUM BROMIDE 10 MG IV SOLR
INTRAVENOUS | Status: DC | PRN
  Administered 2014-09-18 (×4): 5 mg via INTRAVENOUS

## 2014-09-18 MED ORDER — CETYLPYRIDINIUM CHLORIDE 0.05 % MT LIQD
7.0000 mL | Freq: Four times a day (QID) | OROMUCOSAL | Status: DC
  Administered 2014-09-18 – 2014-09-19 (×3): 7 mL via OROMUCOSAL

## 2014-09-18 MED ORDER — MAGNESIUM SULFATE 4 GM/100ML IV SOLN
4.0000 g | Freq: Once | INTRAVENOUS | Status: AC
  Administered 2014-09-18: 4 g via INTRAVENOUS
  Filled 2014-09-18: qty 100

## 2014-09-18 MED ORDER — LACTATED RINGERS IV SOLN
INTRAVENOUS | Status: DC | PRN
  Administered 2014-09-18: 07:00:00 via INTRAVENOUS

## 2014-09-18 MED ORDER — HEMOSTATIC AGENTS (NO CHARGE) OPTIME
TOPICAL | Status: DC | PRN
  Administered 2014-09-18: 5 via TOPICAL

## 2014-09-18 MED ORDER — PHENYLEPHRINE HCL 10 MG/ML IJ SOLN
INTRAMUSCULAR | Status: DC | PRN
  Administered 2014-09-18: 40 ug via INTRAVENOUS
  Administered 2014-09-18: 80 ug via INTRAVENOUS
  Administered 2014-09-18 (×3): 40 ug via INTRAVENOUS

## 2014-09-18 MED ORDER — ASPIRIN 81 MG PO CHEW: 324.0000 mg | CHEWABLE_TABLET | Freq: Every day | ORAL | Status: DC

## 2014-09-18 MED ORDER — MIDAZOLAM HCL 10 MG/2ML IJ SOLN
INTRAMUSCULAR | Status: AC
  Filled 2014-09-18: qty 2

## 2014-09-18 MED ORDER — ACETAMINOPHEN 500 MG PO TABS
1000.0000 mg | ORAL_TABLET | Freq: Four times a day (QID) | ORAL | Status: DC
Start: 2014-09-19 — End: 2014-09-23
  Administered 2014-09-18 – 2014-09-23 (×18): 1000 mg via ORAL
  Filled 2014-09-18 (×21): qty 2

## 2014-09-18 MED ORDER — ARTIFICIAL TEARS OP OINT
TOPICAL_OINTMENT | OPHTHALMIC | Status: DC | PRN
  Administered 2014-09-18: 1 via OPHTHALMIC

## 2014-09-18 MED ORDER — OXYCODONE HCL 5 MG PO TABS
5.0000 mg | ORAL_TABLET | ORAL | Status: DC | PRN
Start: 2014-09-18 — End: 2014-09-23
  Administered 2014-09-18 – 2014-09-19 (×8): 5 mg via ORAL
  Administered 2014-09-20 – 2014-09-22 (×5): 10 mg via ORAL
  Filled 2014-09-18 (×2): qty 1
  Filled 2014-09-18: qty 2
  Filled 2014-09-18 (×2): qty 1
  Filled 2014-09-18: qty 2
  Filled 2014-09-18 (×2): qty 1
  Filled 2014-09-18 (×2): qty 2
  Filled 2014-09-18 (×2): qty 1
  Filled 2014-09-18: qty 2

## 2014-09-18 MED ORDER — SODIUM CHLORIDE 0.9 % IV SOLN
250.0000 [IU] | INTRAVENOUS | Status: DC | PRN
  Administered 2014-09-18: .9 [IU]/h via INTRAVENOUS

## 2014-09-18 MED ORDER — SODIUM CHLORIDE 0.9 % IV SOLN
INTRAVENOUS | Status: AC
  Administered 2014-09-18: 100 mL/h via INTRAVENOUS

## 2014-09-18 MED ORDER — MIDAZOLAM HCL 5 MG/5ML IJ SOLN
INTRAMUSCULAR | Status: DC | PRN
  Administered 2014-09-18 (×2): 1 mg via INTRAVENOUS
  Administered 2014-09-18: 4 mg via INTRAVENOUS
  Administered 2014-09-18: 1 mg via INTRAVENOUS
  Administered 2014-09-18: 3 mg via INTRAVENOUS

## 2014-09-18 MED ORDER — SODIUM CHLORIDE 0.9 % IV SOLN
Freq: Once | INTRAVENOUS | Status: AC
  Administered 2014-09-18: 21:00:00 via INTRAVENOUS

## 2014-09-18 MED ORDER — ROCURONIUM BROMIDE 50 MG/5ML IV SOLN
INTRAVENOUS | Status: AC
  Filled 2014-09-18: qty 1

## 2014-09-18 MED ORDER — MIDAZOLAM HCL 2 MG/2ML IJ SOLN: 2.0000 mg | INTRAMUSCULAR | Status: DC | PRN

## 2014-09-18 MED ORDER — BISACODYL 10 MG RE SUPP: 10.0000 mg | Freq: Every day | RECTAL | Status: DC

## 2014-09-18 MED ORDER — HEPARIN SODIUM (PORCINE) 1000 UNIT/ML IJ SOLN
INTRAMUSCULAR | Status: DC | PRN
  Administered 2014-09-18: 25 mL via INTRAVENOUS

## 2014-09-18 MED ORDER — SODIUM CHLORIDE 0.45 % IV SOLN
INTRAVENOUS | Status: DC | PRN
  Administered 2014-09-18: 20 mL/h via INTRAVENOUS

## 2014-09-18 MED ORDER — LACTATED RINGERS IV SOLN: INTRAVENOUS | Status: DC

## 2014-09-18 MED ORDER — SODIUM CHLORIDE 0.9 % IJ SOLN: 3.0000 mL | INTRAMUSCULAR | Status: DC | PRN

## 2014-09-18 MED ORDER — ONDANSETRON HCL 4 MG/2ML IJ SOLN
4.0000 mg | Freq: Four times a day (QID) | INTRAMUSCULAR | Status: DC | PRN
  Administered 2014-09-20: 4 mg via INTRAVENOUS
  Filled 2014-09-18: qty 2

## 2014-09-18 MED ORDER — NITROGLYCERIN IN D5W 200-5 MCG/ML-% IV SOLN
0.0000 ug/min | INTRAVENOUS | Status: DC
Start: 2014-09-18 — End: 2014-09-19

## 2014-09-18 MED ORDER — SODIUM CHLORIDE 0.9 % IV SOLN
250.0000 mL | INTRAVENOUS | Status: DC
Start: 2014-09-19 — End: 2014-09-23
  Administered 2014-09-19: 250 mL via INTRAVENOUS

## 2014-09-18 MED ORDER — ALBUMIN HUMAN 5 % IV SOLN
INTRAVENOUS | Status: DC | PRN
  Administered 2014-09-18 (×3): via INTRAVENOUS

## 2014-09-18 MED ORDER — SODIUM CHLORIDE 0.9 % IJ SOLN
3.0000 mL | Freq: Two times a day (BID) | INTRAMUSCULAR | Status: DC
  Administered 2014-09-19: 10 mL via INTRAVENOUS
  Administered 2014-09-19 – 2014-09-22 (×3): 3 mL via INTRAVENOUS

## 2014-09-18 MED ORDER — ACETAMINOPHEN 650 MG RE SUPP
650.0000 mg | Freq: Once | RECTAL | Status: AC
  Administered 2014-09-18: 650 mg via RECTAL

## 2014-09-18 MED ORDER — LACTATED RINGERS IV SOLN
INTRAVENOUS | Status: DC | PRN
  Administered 2014-09-18: 08:00:00 via INTRAVENOUS

## 2014-09-18 MED ORDER — DOCUSATE SODIUM 100 MG PO CAPS
200.0000 mg | ORAL_CAPSULE | Freq: Every day | ORAL | Status: DC
  Administered 2014-09-19 – 2014-09-23 (×4): 200 mg via ORAL
  Filled 2014-09-18 (×5): qty 2

## 2014-09-18 MED ORDER — SODIUM CHLORIDE 0.9 % IV SOLN
200.0000 ug | INTRAVENOUS | Status: DC | PRN
  Administered 2014-09-18: .3 ug/kg/h via INTRAVENOUS

## 2014-09-18 MED ORDER — METOPROLOL TARTRATE 1 MG/ML IV SOLN: 2.5000 mg | INTRAVENOUS | Status: DC | PRN

## 2014-09-18 MED ORDER — 0.9 % SODIUM CHLORIDE (POUR BTL) OPTIME
TOPICAL | Status: DC | PRN
  Administered 2014-09-18: 2000 mL

## 2014-09-18 MED ORDER — ACETAMINOPHEN 160 MG/5ML PO SOLN: 1000.0000 mg | Freq: Four times a day (QID) | ORAL | Status: DC

## 2014-09-18 MED ORDER — PHENYLEPHRINE HCL 10 MG/ML IJ SOLN
10.0000 mg | INTRAVENOUS | Status: DC | PRN
  Administered 2014-09-18: 25 ug/min via INTRAVENOUS

## 2014-09-18 MED ORDER — SUCCINYLCHOLINE CHLORIDE 20 MG/ML IJ SOLN
INTRAMUSCULAR | Status: AC
  Filled 2014-09-18: qty 1

## 2014-09-18 MED ORDER — SODIUM CHLORIDE 0.9 % IJ SOLN
INTRAMUSCULAR | Status: DC | PRN
  Administered 2014-09-18: 4 mL via TOPICAL

## 2014-09-18 MED ORDER — LIDOCAINE HCL (CARDIAC) 20 MG/ML IV SOLN
INTRAVENOUS | Status: AC
  Filled 2014-09-18: qty 5

## 2014-09-18 MED ORDER — MORPHINE SULFATE 2 MG/ML IJ SOLN
2.0000 mg | INTRAMUSCULAR | Status: DC | PRN
  Administered 2014-09-18 – 2014-09-19 (×4): 2 mg via INTRAVENOUS
  Filled 2014-09-18 (×5): qty 1

## 2014-09-18 MED ORDER — ROCURONIUM BROMIDE 100 MG/10ML IV SOLN
INTRAVENOUS | Status: DC | PRN
  Administered 2014-09-18: 50 mg via INTRAVENOUS

## 2014-09-18 MED ORDER — PHENYLEPHRINE HCL 10 MG/ML IJ SOLN
0.0000 ug/min | INTRAVENOUS | Status: DC
  Filled 2014-09-18: qty 2

## 2014-09-18 MED ORDER — VANCOMYCIN HCL IN DEXTROSE 1-5 GM/200ML-% IV SOLN
1000.0000 mg | Freq: Once | INTRAVENOUS | Status: AC
  Administered 2014-09-18: 1000 mg via INTRAVENOUS
  Filled 2014-09-18: qty 200

## 2014-09-18 MED ORDER — PROTAMINE SULFATE 10 MG/ML IV SOLN
INTRAVENOUS | Status: DC | PRN
  Administered 2014-09-18: 230 mg via INTRAVENOUS
  Administered 2014-09-18: 10 mg via INTRAVENOUS

## 2014-09-18 MED ORDER — LACTATED RINGERS IV SOLN: 500.0000 mL | Freq: Once | INTRAVENOUS | Status: AC | PRN

## 2014-09-18 MED ORDER — DEXMEDETOMIDINE HCL IN NACL 200 MCG/50ML IV SOLN
0.0000 ug/kg/h | INTRAVENOUS | Status: DC
  Filled 2014-09-18: qty 50

## 2014-09-18 MED ORDER — PHENYLEPHRINE 40 MCG/ML (10ML) SYRINGE FOR IV PUSH (FOR BLOOD PRESSURE SUPPORT)
PREFILLED_SYRINGE | INTRAVENOUS | Status: AC
  Filled 2014-09-18: qty 10

## 2014-09-18 MED ORDER — EPHEDRINE SULFATE 50 MG/ML IJ SOLN
INTRAMUSCULAR | Status: AC
  Filled 2014-09-18: qty 1

## 2014-09-18 MED ORDER — ASPIRIN EC 325 MG PO TBEC
325.0000 mg | DELAYED_RELEASE_TABLET | Freq: Every day | ORAL | Status: DC
  Administered 2014-09-19 – 2014-09-23 (×5): 325 mg via ORAL
  Filled 2014-09-18 (×5): qty 1

## 2014-09-18 MED ORDER — SODIUM BICARBONATE 8.4 % IV SOLN
50.0000 meq | Freq: Once | INTRAVENOUS | Status: AC
  Administered 2014-09-18: 50 meq via INTRAVENOUS

## 2014-09-18 MED ORDER — PROTAMINE SULFATE 10 MG/ML IV SOLN
INTRAVENOUS | Status: AC
  Filled 2014-09-18: qty 25

## 2014-09-18 MED ORDER — METOPROLOL TARTRATE 25 MG/10 ML ORAL SUSPENSION
12.5000 mg | Freq: Two times a day (BID) | ORAL | Status: DC
  Filled 2014-09-18 (×3): qty 5

## 2014-09-18 MED ORDER — DEXTROSE 5 % IV SOLN
1.5000 g | Freq: Two times a day (BID) | INTRAVENOUS | Status: AC
  Administered 2014-09-18 – 2014-09-20 (×4): 1.5 g via INTRAVENOUS
  Filled 2014-09-18 (×4): qty 1.5

## 2014-09-18 MED ORDER — METOPROLOL TARTRATE 12.5 MG HALF TABLET
12.5000 mg | ORAL_TABLET | Freq: Two times a day (BID) | ORAL | Status: DC
  Administered 2014-09-19 – 2014-09-20 (×3): 12.5 mg via ORAL
  Filled 2014-09-18 (×7): qty 1

## 2014-09-18 MED ORDER — POTASSIUM CHLORIDE 10 MEQ/50ML IV SOLN
10.0000 meq | INTRAVENOUS | Status: AC
  Administered 2014-09-18 (×3): 10 meq via INTRAVENOUS

## 2014-09-18 MED ORDER — ACETAMINOPHEN 160 MG/5ML PO SOLN: 650.0000 mg | Freq: Once | ORAL | Status: AC

## 2014-09-18 MED ORDER — HEPARIN SODIUM (PORCINE) 1000 UNIT/ML IJ SOLN
INTRAMUSCULAR | Status: AC
  Filled 2014-09-18: qty 1

## 2014-09-18 MED FILL — Electrolyte-R (PH 7.4) Solution: INTRAVENOUS | Qty: 4000 | Status: AC

## 2014-09-18 MED FILL — Heparin Sodium (Porcine) Inj 1000 Unit/ML: INTRAMUSCULAR | Qty: 10 | Status: AC

## 2014-09-18 MED FILL — Lidocaine HCl IV Inj 20 MG/ML: INTRAVENOUS | Qty: 5 | Status: AC

## 2014-09-18 MED FILL — Sodium Bicarbonate IV Soln 8.4%: INTRAVENOUS | Qty: 50 | Status: AC

## 2014-09-18 MED FILL — Mannitol IV Soln 20%: INTRAVENOUS | Qty: 500 | Status: AC

## 2014-09-18 MED FILL — Sodium Chloride IV Soln 0.9%: INTRAVENOUS | Qty: 2000 | Status: AC

## 2014-09-18 SURGICAL SUPPLY — 93 items
ADAPTER CARDIO PERF ANTE/RETRO (ADAPTER) ×3 IMPLANT
ADPR PRFSN 84XANTGRD RTRGD (ADAPTER) ×2
BAG DECANTER FOR FLEXI CONT (MISCELLANEOUS) ×6 IMPLANT
BANDAGE ELASTIC 4 VELCRO ST LF (GAUZE/BANDAGES/DRESSINGS) ×3 IMPLANT
BANDAGE ELASTIC 6 VELCRO ST LF (GAUZE/BANDAGES/DRESSINGS) ×3 IMPLANT
BASKET HEART (ORDER IN 25'S) (MISCELLANEOUS) ×1
BASKET HEART (ORDER IN 25S) (MISCELLANEOUS) ×2 IMPLANT
BLADE STERNUM SYSTEM 6 (BLADE) ×3 IMPLANT
BLADE SURG ROTATE 9660 (MISCELLANEOUS) IMPLANT
BNDG GAUZE ELAST 4 BULKY (GAUZE/BANDAGES/DRESSINGS) ×3 IMPLANT
CANISTER SUCTION 2500CC (MISCELLANEOUS) ×3 IMPLANT
CANNULA EZ GLIDE AORTIC 21FR (CANNULA) ×6 IMPLANT
CANNULA GUNDRY RCSP 15FR (MISCELLANEOUS) ×3 IMPLANT
CATH CPB KIT OWEN (MISCELLANEOUS) ×3 IMPLANT
CATH THORACIC 36FR (CATHETERS) ×3 IMPLANT
CLIP RETRACTION 3.0MM CORONARY (MISCELLANEOUS) ×3 IMPLANT
CLIP TI MEDIUM 24 (CLIP) IMPLANT
CLIP TI WIDE RED SMALL 24 (CLIP) IMPLANT
CRADLE DONUT ADULT HEAD (MISCELLANEOUS) ×3 IMPLANT
DRAIN CHANNEL 32F RND 10.7 FF (WOUND CARE) ×6 IMPLANT
DRAPE CARDIOVASCULAR INCISE (DRAPES) ×3
DRAPE INCISE IOBAN 66X45 STRL (DRAPES) ×3 IMPLANT
DRAPE SLUSH/WARMER DISC (DRAPES) ×3 IMPLANT
DRAPE SRG 135X102X78XABS (DRAPES) ×2 IMPLANT
DRSG AQUACEL AG ADV 3.5X14 (GAUZE/BANDAGES/DRESSINGS) ×3 IMPLANT
DRSG COVADERM 4X14 (GAUZE/BANDAGES/DRESSINGS) ×3 IMPLANT
ELECT REM PT RETURN 9FT ADLT (ELECTROSURGICAL) ×6
ELECTRODE REM PT RTRN 9FT ADLT (ELECTROSURGICAL) ×4 IMPLANT
GAUZE SPONGE 4X4 12PLY STRL (GAUZE/BANDAGES/DRESSINGS) ×6 IMPLANT
GLOVE ORTHO TXT STRL SZ7.5 (GLOVE) ×6 IMPLANT
GOWN STRL REUS W/ TWL LRG LVL3 (GOWN DISPOSABLE) ×8 IMPLANT
GOWN STRL REUS W/TWL LRG LVL3 (GOWN DISPOSABLE) ×4
HEMOSTAT POWDER SURGIFOAM 1G (HEMOSTASIS) ×15 IMPLANT
INSERT FOGARTY XLG (MISCELLANEOUS) ×3 IMPLANT
KIT BASIN OR (CUSTOM PROCEDURE TRAY) ×3 IMPLANT
KIT ROOM TURNOVER OR (KITS) ×3 IMPLANT
KIT SUCTION CATH 14FR (SUCTIONS) ×9 IMPLANT
KIT VASOVIEW W/TROCAR VH 2000 (KITS) ×3 IMPLANT
LEAD PACING MYOCARDI (MISCELLANEOUS) ×3 IMPLANT
MARKER GRAFT CORONARY BYPASS (MISCELLANEOUS) ×9 IMPLANT
NS IRRIG 1000ML POUR BTL (IV SOLUTION) ×15 IMPLANT
PACK OPEN HEART (CUSTOM PROCEDURE TRAY) ×3 IMPLANT
PAD ARMBOARD 7.5X6 YLW CONV (MISCELLANEOUS) ×6 IMPLANT
PAD ELECT DEFIB RADIOL ZOLL (MISCELLANEOUS) ×3 IMPLANT
PENCIL BUTTON HOLSTER BLD 10FT (ELECTRODE) ×3 IMPLANT
PUNCH AORTIC ROTATE 4.0MM (MISCELLANEOUS) ×3 IMPLANT
PUNCH AORTIC ROTATE 4.5MM 8IN (MISCELLANEOUS) IMPLANT
PUNCH AORTIC ROTATE 5MM 8IN (MISCELLANEOUS) IMPLANT
SET CARDIOPLEGIA MPS 5001102 (MISCELLANEOUS) ×3 IMPLANT
SOLUTION ANTI FOG 6CC (MISCELLANEOUS) IMPLANT
SPONGE GAUZE 4X4 12PLY STER LF (GAUZE/BANDAGES/DRESSINGS) ×6 IMPLANT
SPONGE LAP 18X18 X RAY DECT (DISPOSABLE) ×3 IMPLANT
SPONGE LAP 4X18 X RAY DECT (DISPOSABLE) ×3 IMPLANT
SUT BONE WAX W31G (SUTURE) ×3 IMPLANT
SUT ETHIBOND X763 2 0 SH 1 (SUTURE) ×6 IMPLANT
SUT MNCRL AB 3-0 PS2 18 (SUTURE) ×6 IMPLANT
SUT MNCRL AB 4-0 PS2 18 (SUTURE) IMPLANT
SUT PDS AB 1 CTX 36 (SUTURE) ×6 IMPLANT
SUT PROLENE 2 0 SH DA (SUTURE) IMPLANT
SUT PROLENE 3 0 SH DA (SUTURE) ×3 IMPLANT
SUT PROLENE 3 0 SH1 36 (SUTURE) IMPLANT
SUT PROLENE 4 0 RB 1 (SUTURE) ×3
SUT PROLENE 4 0 SH DA (SUTURE) ×3 IMPLANT
SUT PROLENE 4-0 RB1 .5 CRCL 36 (SUTURE) ×2 IMPLANT
SUT PROLENE 5 0 C 1 36 (SUTURE) IMPLANT
SUT PROLENE 6 0 C 1 30 (SUTURE) ×3 IMPLANT
SUT PROLENE 7.0 RB 3 (SUTURE) ×9 IMPLANT
SUT PROLENE 8 0 BV175 6 (SUTURE) ×9 IMPLANT
SUT PROLENE BLUE 7 0 (SUTURE) ×27 IMPLANT
SUT PROLENE POLY MONO (SUTURE) IMPLANT
SUT SILK  1 MH (SUTURE) ×1
SUT SILK 1 MH (SUTURE) ×2 IMPLANT
SUT STEEL 6MS V (SUTURE) IMPLANT
SUT STEEL STERNAL CCS#1 18IN (SUTURE) IMPLANT
SUT STEEL SZ 6 DBL 3X14 BALL (SUTURE) IMPLANT
SUT VIC AB 1 CTX 36 (SUTURE)
SUT VIC AB 1 CTX36XBRD ANBCTR (SUTURE) IMPLANT
SUT VIC AB 2-0 CT1 27 (SUTURE) ×3
SUT VIC AB 2-0 CT1 TAPERPNT 27 (SUTURE) ×2 IMPLANT
SUT VIC AB 2-0 CTX 27 (SUTURE) IMPLANT
SUT VIC AB 3-0 SH 27 (SUTURE)
SUT VIC AB 3-0 SH 27X BRD (SUTURE) IMPLANT
SUT VIC AB 3-0 X1 27 (SUTURE) ×3 IMPLANT
SUT VICRYL 4-0 PS2 18IN ABS (SUTURE) IMPLANT
SUTURE E-PAK OPEN HEART (SUTURE) ×3 IMPLANT
SYSTEM SAHARA CHEST DRAIN ATS (WOUND CARE) ×3 IMPLANT
TAPE CLOTH SURG 4X10 WHT LF (GAUZE/BANDAGES/DRESSINGS) ×3 IMPLANT
TOWEL OR 17X24 6PK STRL BLUE (TOWEL DISPOSABLE) ×6 IMPLANT
TOWEL OR 17X26 10 PK STRL BLUE (TOWEL DISPOSABLE) ×6 IMPLANT
TRAY FOLEY IC TEMP SENS 16FR (CATHETERS) ×3 IMPLANT
TUBING INSUFFLATION (TUBING) ×3 IMPLANT
UNDERPAD 30X30 INCONTINENT (UNDERPADS AND DIAPERS) ×3 IMPLANT
WATER STERILE IRR 1000ML POUR (IV SOLUTION) ×6 IMPLANT

## 2014-09-18 NOTE — Anesthesia Procedure Notes (Signed)
Procedure Name: Intubation Date/Time: 09/18/2014 8:00 AM Performed by: Clearnce Sorrel Pre-anesthesia Checklist: Patient identified, Emergency Drugs available, Suction available, Patient being monitored and Timeout performed Patient Re-evaluated:Patient Re-evaluated prior to inductionOxygen Delivery Method: Circle system utilized Preoxygenation: Pre-oxygenation with 100% oxygen Intubation Type: IV induction Ventilation: Mask ventilation without difficulty Laryngoscope Size: Mac and 3 Grade View: Grade II Tube type: Oral Endobronchial tube: EBT position confirmed by auscultation Tube size: 8.0 mm Number of attempts: 1 Airway Equipment and Method: Stylet Placement Confirmation: ETT inserted through vocal cords under direct vision,  positive ETCO2 and breath sounds checked- equal and bilateral Secured at: 22 cm Tube secured with: Tape Dental Injury: Teeth and Oropharynx as per pre-operative assessment

## 2014-09-18 NOTE — Brief Op Note (Addendum)
09/13/2014 - 09/18/2014      [address]       [place] [address]             [phone]     09/13/2014 - 09/18/2014  11:07 AM  PATIENT:  Debbie Delacruz  69 y.o. female  PRE-OPERATIVE DIAGNOSIS:  CAD  POST-OPERATIVE DIAGNOSIS:  CAD  PROCEDURE:  Procedure(s): CORONARY ARTERY BYPASS GRAFTING (CABG)X4 LIMA-LAD; SEQ SVG-PD-DIST RCA; SVG-OM EVH RIGHT THIGH GREATER SAPHENOUS VEIN TRANSESOPHAGEAL ECHOCARDIOGRAM (TEE)  SURGEON:    [name], MD  ASSISTANTS:  John Giovanni, PA-C  ANESTHESIA:   Laurie Panda, MD  CROSSCLAMP TIME:   79'  CARDIOPULMONARY BYPASS TIME: 100'  FINDINGS:  Normal LV systolic function  Remote myocardial infarction involving distal anterior wall, septum and apex  Good quality LIMA conduit for grafting  Good quality SVG conduit for grafting  Good quality target vessels for grafting  COMPLICATIONS: None  BASELINE WEIGHT: 81.5 kg  PATIENT DISPOSITION:   TO SICU IN STABLE CONDITION  [name] 09/18/2014 12:36 PM

## 2014-09-18 NOTE — Plan of Care (Signed)
Patient has been in the OR since this morning for CABG. Once she comes out of the OR if she still under the hospitalist service we will see her or else kindly call with any questions. She likely will be transferred to Dr. Roxy Manns service cardiothoracic surgery postop.  Thurnell Lose M.D on 09/18/2014 at 10:50 AM  Between 7am to 7pm - Pager - [phone], After 7pm go to [email or web address] - password [facility]  Office  [phone]

## 2014-09-18 NOTE — Op Note (Signed)
CARDIOTHORACIC SURGERY OPERATIVE NOTE  Date of Procedure: 09/18/2014  Preoperative Diagnosis:   Severe 3-vessel Coronary Artery Disease  Unstable Angina Pectoris  Postoperative Diagnosis: Same  Procedure:    Coronary Artery Bypass Grafting x 4   Left Internal Mammary Artery to Distal Left Anterior Descending Coronary Artery  Saphenous Vein Graft to Posterior Descending Coronary Artery  Sequential Saphenous Vein Graft to Distal Right Coronary Artery  Saphenous Vein Graft to Obtuse Marginal Branch of Left Circumflex Coronary Artery  Endoscopic Vein Harvest from Right Thigh   Surgeon: Valentina Gu. Roxy Manns, MD  Assistant: John Giovanni, PA-C  Anesthesia: Laurie Panda, MD  Operative Findings:  Normal LV systolic function  Remote myocardial infarction involving distal anterior wall, septum and apex  Good quality LIMA conduit for grafting  Good quality SVG conduit for grafting  Good quality target vessels for grafting     BRIEF CLINICAL NOTE AND INDICATIONS FOR SURGERY  Patient is a 69 year old female with known history of coronary artery disease, hypertension, hyperlipidemia, and a strong family history of coronary artery disease who has been referred for possible surgical treatment of severe multivessel coronary artery disease with unstable angina pectoris. The patient's cardiac history dates back to 1999 when she suffered an acute inferior wall myocardial infarction just before her 52th birthday. She was hospitalized at [facility] where she underwent diagnostic cardiac catheterization followed by PCI with stenting of her right coronary artery. She recovered uneventfully and has done well until recently. She states that she was in her usual state of health until early January of this year when she developed new onset symptoms of exertional shortness of breath and chest tightness with severe fatigue. Symptoms progressed fairly rapidly over a relatively brief  period of time, ultimately causing her to present to [facility]. She was found to be severely anemic at that time and transfused 2 units packed red blood cells. Details of that hospitalization are not currently available and it is unclear whether or not the patient ruled in for an acute non-ST segment elevation myocardial infarction. Stool was reportedly Hemoccult negative at that time. She was referred to a gastroenterologist who subsequently ordered an upper GI barium swallow and CT scan of the abdomen. She has been told that she had evidence of GE reflux disease with esophagitis. The patient was reportedly felt to be too high risk to undergo upper GI endoscopy.The patient was additionally seen in follow-up by her cardiologist and diagnostic cardiac catheterization was recommended. Catheterization was performed 07/11/2014 and confirmed the presence of severe multivessel coronary artery disease with 100% chronic occlusion of the left anterior descending coronary artery. Left ventricular systolic function was mildly reduced. The patient's symptoms improved following transfusion at the time of her brief hospitalization in January, but she has continued to experience nearly daily episodes of substernal chest tightness and shortness of breath consistent with angina pectoralis that have been refractory to medical therapy. The patient was referred for possible surgical revascularization. The patient has been seen in consultation and counseled at length regarding the indications, risks and potential benefits of surgery.  Surgery was scheduled electively for the first week of May per the patient's request.  However, she was subsequently admitted to the hospital acutely for a syncopal episode associated with chest pain.  She ruled out for acute myocardial infarction.  Plans were made for urgent surgery.  All questions have been answered, and the patient provides full informed consent for the  operation as described.  DETAILS OF THE OPERATIVE PROCEDURE  Preparation:  The patient is brought to the operating room on the above mentioned date and central monitoring was established by the anesthesia team including placement of Swan-Ganz catheter and radial arterial line. The patient is placed in the supine position on the operating table.  Intravenous antibiotics are administered. General endotracheal anesthesia is induced uneventfully. A Foley catheter is placed.  Baseline transesophageal echocardiogram was performed.  Findings were notable for normal LV systolic function with ejection fraction estimated 55%.  The patient's chest, abdomen, both groins, and both lower extremities are prepared and draped in a sterile manner. A time out procedure is performed.   Surgical Approach and Conduit Harvest:  A median sternotomy incision was performed and the left internal mammary artery is dissected from the chest wall and prepared for bypass grafting. The left internal mammary artery is notably good quality conduit. Simultaneously, the greater saphenous vein is obtained from the patient's right thigh using endoscopic vein harvest technique. The saphenous vein is notably good quality conduit. After removal of the saphenous vein, the small surgical incisions in the lower extremity are closed with absorbable suture. Following systemic heparinization, the left internal mammary artery was transected distally noted to have excellent flow.   Extracorporeal Cardiopulmonary Bypass and Myocardial Protection:  The pericardium is opened. The ascending aorta is normal in appearance. The ascending aorta and the right atrium are cannulated for cardiopulmonary bypass.  Adequate heparinization is verified.   A retrograde cardioplegia cannula is placed through the right atrium into the coronary sinus.  The entire pre-bypass portion of the operation was notable for stable hemodynamics.  Cardiopulmonary bypass  was begun and the surface of the heart is inspected. Distal target vessels are selected for coronary artery bypass grafting. There is scarring in the distal anterior wall consistent with remote myocardial infarction.  A cardioplegia cannula is placed in the ascending aorta.  A temperature probe was placed in the interventricular septum.  The patient is allowed to cool passively to [facility] systemic temperature.  The aortic cross clamp is applied and cold blood cardioplegia is delivered initially in an antegrade fashion through the aortic root.   Supplemental cardioplegia is given retrograde through the coronary sinus catheter.  Iced saline slush is applied for topical hypothermia.  The initial cardioplegic arrest is rapid with early diastolic arrest.  Repeat doses of cardioplegia are administered intermittently throughout the entire cross clamp portion of the operation through the aortic root, through the coronary sinus catheter, and through subsequently placed vein grafts in order to maintain completely flat electrocardiogram and septal myocardial temperature below 15C.  Myocardial protection was felt to be excellent.  Coronary Artery Bypass Grafting:   The posterior descending branch of the right coronary artery was grafted using a reversed saphenous vein graft in an side-to-side fashion.  At the site of distal anastomosis the target vessel was good quality and measured approximately 1.8 mm in diameter.  The distal right coronary artery was grafted in and end-to-side fashion using a sequential saphenous vein graft off of the distal segment of the vein graft placed to the posterior descending coronary artery.  At the site of distal anastomosis the target vessel was good quality and measured approximately 2.0 mm in diameter.  The obtuse marginal branch of the left circumflex coronary artery was grafted using a reversed saphenous vein graft in an end-to-side fashion.  At the site of distal anastomosis the  target vessel was good quality and measured approximately 2.0 mm in  diameter.  The distal left anterior coronary artery was grafted with the left internal mammary artery in an end-to-side fashion.  At the site of distal anastomosis the target vessel was fair to good quality and measured approximately 1.5 mm in diameter.  All proximal vein graft anastomoses were placed directly to the ascending aorta prior to removal of the aortic cross clamp.  The septal myocardial temperature rose rapidly after reperfusion of the left internal mammary artery graft.  The aortic cross clamp was removed after a total cross clamp time of 79 minutes.   Procedure Completion:  All proximal and distal coronary anastomoses were inspected for hemostasis and appropriate graft orientation. Epicardial pacing wires are fixed to the right ventricular outflow tract and to the right atrial appendage. The patient is rewarmed to 37C temperature. The patient is weaned and disconnected from cardiopulmonary bypass.  The patient's rhythm at separation from bypass was sinus brady.  Atrial pacing was employed.  The patient was weaned from cardiopulmonary bypass without any inotropic support. Total cardiopulmonary bypass time for the operation was 100 minutes.  Followup transesophageal echocardiogram performed after separation from bypass revealed no changes from the preoperative exam.  The aortic and venous cannula were removed uneventfully. Protamine was administered to reverse the anticoagulation. The mediastinum and pleural space were inspected for hemostasis and irrigated with saline solution. The mediastinum and the left pleural space were drained using 3 chest tubes placed through separate stab incisions inferiorly.  The soft tissues anterior to the aorta were reapproximated loosely. The sternum is closed with double strength sternal wire. The soft tissues anterior to the sternum were closed in multiple layers and the skin is closed  with a running subcuticular skin closure.  The post-bypass portion of the operation was notable for stable rhythm and hemodynamics.  The patient received 2 units packed red blood cells during the procedure due to anemia which was present preoperatively and exacerbated by acute blood loss and hemodilution during cardiopulmonary bypass.   Disposition:  The patient tolerated the procedure well and is transported to the surgical intensive care in stable condition. There are no intraoperative complications. All sponge and instrument counts are verified correct at completion of the operation.    Valentina Gu. Roxy Manns MD 09/18/2014 12:40 PM

## 2014-09-18 NOTE — Anesthesia Postprocedure Evaluation (Signed)
  Anesthesia Post-op Note  Patient: Debbie Delacruz  Procedure(s) Performed: Procedure(s): CORONARY ARTERY BYPASS GRAFTING (CABG)times four using LIMA  to LAD:SVG to  PD and DIST RCA;SVG to OM;, EVH Right thigh (N/A) TRANSESOPHAGEAL ECHOCARDIOGRAM (TEE) (N/A)  Patient Location: ICU  Anesthesia Type:General  Level of Consciousness: sedated  Airway and Oxygen Therapy: Patient remains intubated per anesthesia plan  Post-op Pain: none  Post-op Assessment: Post-op Vital signs reviewed, Patient's Cardiovascular Status Stable, Respiratory Function Stable, Patent Airway and Pain level controlled  Post-op Vital Signs: Reviewed and stable  Last Vitals:  Filed Vitals:   09/18/14 1311  BP: 112/60  Pulse: 86  Temp:   Resp: 14    Complications: No apparent anesthesia complications

## 2014-09-18 NOTE — Anesthesia Preprocedure Evaluation (Addendum)
Anesthesia Evaluation  Patient identified by MRN, date of birth, ID band Patient awake    Reviewed: Allergy & Precautions, NPO status , Patient's Chart, lab work & pertinent test results, reviewed documented beta blocker date and time   History of Anesthesia Complications (+) PONV and history of anesthetic complications  Airway Mallampati: III  TM Distance: >3 FB Neck ROM: Full    Dental  (+) Teeth Intact   Pulmonary sleep apnea ,  breath sounds clear to auscultation  Pulmonary exam normal       Cardiovascular hypertension, Pt. on home beta blockers and Pt. on medications + angina + CAD and + Past MI Rhythm:Regular Rate:Normal     Neuro/Psych  Headaches, Anxiety    GI/Hepatic Neg liver ROS, hiatal hernia, GERD-  Controlled and Medicated,  Endo/Other    Renal/GU negative Renal ROS     Musculoskeletal   Abdominal (+)  Abdomen: soft.    Peds  Hematology  (+) anemia ,   Anesthesia Other Findings   Reproductive/Obstetrics                          Anesthesia Physical Anesthesia Plan  ASA: IV  Anesthesia Plan: General   Post-op Pain Management:    Induction: Intravenous  Airway Management Planned: Oral ETT  Additional Equipment: Arterial line, CVP, PA Cath and TEE  Intra-op Plan:   Post-operative Plan: Post-operative intubation/ventilation  Informed Consent: I have reviewed the patients History and Physical, chart, labs and discussed the procedure including the risks, benefits and alternatives for the proposed anesthesia with the patient or authorized representative who has indicated his/her understanding and acceptance.   Dental advisory given  Plan Discussed with: CRNA and Surgeon  Anesthesia Plan Comments:        Anesthesia Quick Evaluation

## 2014-09-18 NOTE — Progress Notes (Signed)
  Echocardiogram Echocardiogram Transesophageal has been performed.  Debbie Delacruz 09/18/2014, 8:59 AM

## 2014-09-18 NOTE — Transfer of Care (Signed)
Immediate Anesthesia Transfer of Care Note  Patient: Debbie Delacruz  Procedure(s) Performed: Procedure(s): CORONARY ARTERY BYPASS GRAFTING (CABG)times four using LIMA  to LAD:SVG to  PD and DIST RCA;SVG to OM;, EVH Right thigh (N/A) TRANSESOPHAGEAL ECHOCARDIOGRAM (TEE) (N/A)  Patient Location: SICU  Anesthesia Type:General  Level of Consciousness: Patient remains intubated per anesthesia plan  Airway & Oxygen Therapy: Patient remains intubated per anesthesia plan  Post-op Assessment: Report given to RN and Post -op Vital signs reviewed and stable  Post vital signs: Reviewed  Last Vitals:  Filed Vitals:   09/18/14 0500  BP: 121/79  Pulse: 69  Temp: 37 C  Resp: 18    Complications: No apparent anesthesia complications

## 2014-09-18 NOTE — Procedures (Signed)
Extubation Procedure Note  Patient Details:   Name: Debbie Delacruz DOB: Jul 22, 1945 MRN: [id number]   Airway Documentation:     Evaluation  O2 sats: stable throughout Complications: No apparent complications Patient did tolerate procedure well. Bilateral Breath Sounds: Clear, Diminished   Yes  Pt extubated at [place]. NIF -25 VC- 0.7L Pt able to vocalize and clear secretions. RT to monitor as needed.  Jori Moll 09/18/2014, 6:14 PM

## 2014-09-18 NOTE — Progress Notes (Signed)
[facility] BRIEF SICU PROGRESS NOTE  Day of Surgery  S/P Procedure(s) (LRB): CORONARY ARTERY BYPASS GRAFTING (CABG)times four using LIMA  to LAD:SVG to  PD and DIST RCA;SVG to OM;, EVH Right thigh (N/A) TRANSESOPHAGEAL ECHOCARDIOGRAM (TEE) (N/A)   Extubated uneventfully Awake and alert, mild soreness in chest NSR - AAI paced with stable hemodynamics Chest tube output low UOP adequate Labs okay although Hgb down to 7.8  Plan: Will transfuse 1 unit PRBC's.  Otherwise routine postop  [name] 09/18/2014 7:36 PM

## 2014-09-19 ENCOUNTER — Encounter (HOSPITAL_COMMUNITY): Payer: Self-pay | Admitting: Thoracic Surgery (Cardiothoracic Vascular Surgery)

## 2014-09-19 ENCOUNTER — Inpatient Hospital Stay (HOSPITAL_COMMUNITY): Payer: Medicare Other

## 2014-09-19 DIAGNOSIS — Z951 Presence of aortocoronary bypass graft: Secondary | ICD-10-CM

## 2014-09-19 LAB — BASIC METABOLIC PANEL
Anion gap: 6 (ref 5–15)
BUN: 12 mg/dL (ref 6–23)
CALCIUM: 7.5 mg/dL — AB (ref 8.4–10.5)
CO2: 22 mmol/L (ref 19–32)
Chloride: 109 mmol/L (ref 96–112)
Creatinine, Ser: 0.8 mg/dL (ref 0.50–1.10)
GFR calc Af Amer: 87 mL/min — ABNORMAL LOW (ref >90)
GFR, EST NON AFRICAN AMERICAN: 75 mL/min — AB (ref >90)
GLUCOSE: 102 mg/dL — AB (ref 70–99)
POTASSIUM: 4.2 mmol/L (ref 3.5–5.1)
Sodium: 137 mmol/L (ref 135–145)

## 2014-09-19 LAB — GLUCOSE, CAPILLARY
GLUCOSE-CAPILLARY: 100 mg/dL — AB (ref 70–99)
GLUCOSE-CAPILLARY: 130 mg/dL — AB (ref 70–99)
GLUCOSE-CAPILLARY: 98 mg/dL (ref 70–99)
Glucose-Capillary: 110 mg/dL — ABNORMAL HIGH (ref 70–99)
Glucose-Capillary: 156 mg/dL — ABNORMAL HIGH (ref 70–99)
Glucose-Capillary: 94 mg/dL (ref 70–99)
Glucose-Capillary: 96 mg/dL (ref 70–99)
Glucose-Capillary: 97 mg/dL (ref 70–99)

## 2014-09-19 LAB — CBC
HCT: 27 % — ABNORMAL LOW (ref 36.0–46.0)
HCT: 27.4 % — ABNORMAL LOW (ref 36.0–46.0)
Hemoglobin: 8.3 g/dL — ABNORMAL LOW (ref 12.0–15.0)
Hemoglobin: 8.1 g/dL — ABNORMAL LOW (ref 12.0–15.0)
MCH: 22.9 pg — ABNORMAL LOW (ref 26.0–34.0)
MCH: 22.6 pg — AB (ref 26.0–34.0)
MCHC: 30.7 g/dL (ref 30.0–36.0)
MCHC: 29.6 g/dL — ABNORMAL LOW (ref 30.0–36.0)
MCV: 74.4 fL — AB (ref 78.0–100.0)
MCV: 76.5 fL — AB (ref 78.0–100.0)
PLATELETS: 194 10*3/uL (ref 150–400)
PLATELETS: 154 10*3/uL (ref 150–400)
RBC: 3.63 MIL/uL — AB (ref 3.87–5.11)
RBC: 3.58 MIL/uL — ABNORMAL LOW (ref 3.87–5.11)
RDW: 21.1 % — ABNORMAL HIGH (ref 11.5–15.5)
RDW: 22.6 % — ABNORMAL HIGH (ref 11.5–15.5)
WBC: 15.8 10*3/uL — ABNORMAL HIGH (ref 4.0–10.5)
WBC: 11.9 10*3/uL — AB (ref 4.0–10.5)

## 2014-09-19 LAB — POCT I-STAT, CHEM 8
BUN: 15 mg/dL (ref 6–23)
BUN: 15 mg/dL (ref 6–23)
Calcium, Ion: 1.18 mmol/L (ref 1.13–1.30)
Calcium, Ion: 1.18 mmol/L (ref 1.13–1.30)
Chloride: 101 mmol/L (ref 96–112)
Chloride: 103 mmol/L (ref 96–112)
Creatinine, Ser: 0.8 mg/dL (ref 0.50–1.10)
Creatinine, Ser: 0.8 mg/dL (ref 0.50–1.10)
GLUCOSE: 129 mg/dL — AB (ref 70–99)
Glucose, Bld: 114 mg/dL — ABNORMAL HIGH (ref 70–99)
HCT: 28 % — ABNORMAL LOW (ref 36.0–46.0)
HEMATOCRIT: 25 % — AB (ref 36.0–46.0)
HEMOGLOBIN: 8.5 g/dL — AB (ref 12.0–15.0)
Hemoglobin: 9.5 g/dL — ABNORMAL LOW (ref 12.0–15.0)
Potassium: 4.4 mmol/L (ref 3.5–5.1)
Potassium: 4.4 mmol/L (ref 3.5–5.1)
Sodium: 137 mmol/L (ref 135–145)
Sodium: 138 mmol/L (ref 135–145)
TCO2: 21 mmol/L (ref 0–100)
TCO2: 21 mmol/L (ref 0–100)

## 2014-09-19 LAB — HEMOGLOBIN A1C
HEMOGLOBIN A1C: 6.3 % — AB (ref 4.8–5.6)
Mean Plasma Glucose: 134 mg/dL

## 2014-09-19 LAB — POCT I-STAT 3, ART BLOOD GAS (G3+)
Acid-base deficit: 5 mmol/L — ABNORMAL HIGH (ref 0.0–2.0)
Bicarbonate: 21.2 mEq/L (ref 20.0–24.0)
O2 Saturation: 95 %
TCO2: 22 mmol/L (ref 0–100)
pCO2 arterial: 42.3 mmHg (ref 35.0–45.0)
pH, Arterial: 7.307 — ABNORMAL LOW (ref 7.350–7.450)
pO2, Arterial: 83 mmHg (ref 80.0–100.0)

## 2014-09-19 LAB — MAGNESIUM
MAGNESIUM: 2.8 mg/dL — AB (ref 1.5–2.5)
Magnesium: 2.6 mg/dL — ABNORMAL HIGH (ref 1.5–2.5)

## 2014-09-19 LAB — CREATININE, SERUM
CREATININE: 0.81 mg/dL (ref 0.50–1.10)
GFR calc non Af Amer: 74 mL/min — ABNORMAL LOW (ref >90)
GFR, EST AFRICAN AMERICAN: 86 mL/min — AB (ref >90)

## 2014-09-19 MED ORDER — PANTOPRAZOLE SODIUM 40 MG PO TBEC: 40.0000 mg | DELAYED_RELEASE_TABLET | Freq: Every day | ORAL | Status: DC

## 2014-09-19 MED ORDER — FUROSEMIDE 10 MG/ML IJ SOLN
20.0000 mg | Freq: Once | INTRAMUSCULAR | Status: AC
  Administered 2014-09-19: 20 mg via INTRAVENOUS

## 2014-09-19 MED ORDER — PANTOPRAZOLE SODIUM 40 MG PO TBEC
40.0000 mg | DELAYED_RELEASE_TABLET | Freq: Two times a day (BID) | ORAL | Status: DC
  Administered 2014-09-19 – 2014-09-22 (×8): 40 mg via ORAL
  Filled 2014-09-19 (×8): qty 1

## 2014-09-19 MED ORDER — INSULIN ASPART 100 UNIT/ML ~~LOC~~ SOLN
0.0000 [IU] | SUBCUTANEOUS | Status: DC
  Administered 2014-09-19 – 2014-09-20 (×3): 2 [IU] via SUBCUTANEOUS

## 2014-09-19 MED ORDER — MORPHINE SULFATE 2 MG/ML IJ SOLN
2.0000 mg | INTRAMUSCULAR | Status: DC | PRN
  Administered 2014-09-19 (×3): 2 mg via INTRAVENOUS
  Filled 2014-09-19 (×3): qty 1

## 2014-09-19 MED FILL — Dexmedetomidine HCl in NaCl 0.9% IV Soln 400 MCG/100ML: INTRAVENOUS | Qty: 100 | Status: AC

## 2014-09-19 MED FILL — Heparin Sodium (Porcine) Inj 1000 Unit/ML: INTRAMUSCULAR | Qty: 30 | Status: AC

## 2014-09-19 MED FILL — Potassium Chloride Inj 2 mEq/ML: INTRAVENOUS | Qty: 40 | Status: AC

## 2014-09-19 MED FILL — Magnesium Sulfate Inj 50%: INTRAMUSCULAR | Qty: 10 | Status: AC

## 2014-09-19 NOTE — Plan of Care (Signed)
Problem: Phase II - Intermediate Post-Op Goal: Activity Progressed Outcome: Progressing OOB to chair.

## 2014-09-19 NOTE — Plan of Care (Signed)
Problem: Phase II - Intermediate Post-Op Goal: Maintain Hemodynamic Stability Outcome: Completed/Met Date Met:  09/19/14 No drips. Neo weaned off at 40. MAP remains >65.

## 2014-09-19 NOTE — Progress Notes (Signed)
[address]       [place] [address]             [phone]        CARDIOTHORACIC SURGERY PROGRESS NOTE   R1 Day Post-Op Procedure(s) (LRB): CORONARY ARTERY BYPASS GRAFTING (CABG)times four using LIMA  to LAD:SVG to  PD and DIST RCA;SVG to OM;, EVH Right thigh (N/A) TRANSESOPHAGEAL ECHOCARDIOGRAM (TEE) (N/A)  Subjective: Feels well.  Very mild soreness in chest.  No SOB  Objective: Vital signs: BP Readings from Last 1 Encounters:  09/19/14 92/52   Pulse Readings from Last 1 Encounters:  09/19/14 90   Resp Readings from Last 1 Encounters:  09/19/14 16   Temp Readings from Last 1 Encounters:  09/19/14 98.6 F (37 C)     Hemodynamics: PAP: (17-34)/(5-25) 27/16 mmHg CO:  [3.9 L/min-5.7 L/min] 5.7 L/min CI:  [2.1 L/min/m2-3.1 L/min/m2] 3.1 L/min/m2  Physical Exam:  Rhythm:   sinus  Breath sounds: clear  Heart sounds:  RRR  Incisions:  Dressing dry, intact  Abdomen:  Soft, non-distended, non-tender  Extremities:  Warm, well-perfused  Chest tubes:  Low volume thin serosanguinous output, no air leak    Intake/Output from previous day: 04/26 0701 - 04/27 0700 In: 7107.9 [P.O.:400; I.V.:3872.9; Blood:735; IV [id number]:5093] Out: 2730 [Urine:2370; Chest Tube:360] Intake/Output this shift: Total I/O In: 65 [I.V.:65] Out: -   Lab Results:  CBC: Recent Labs  09/18/14 1845 09/18/14 1852 09/19/14 0412  WBC 17.4*  --  15.8*  HGB 7.5* 7.8* 8.3*  HCT 25.3* 23.0* 27.0*  PLT 183  --  194    BMET:  Recent Labs  09/18/14 0424  09/18/14 1852 09/19/14 0412  NA 140  < > 139 137  K 4.0  < > 4.4 4.2  CL 108  < > 110 109  CO2 22  --   --  22  GLUCOSE 114*  < > 120* 102*  BUN 14  < > 11 12  CREATININE 0.88  < > 0.70 0.80  CALCIUM 8.5  --   --  7.5*  < > = values in this interval not displayed.   CBG (last 3)   Recent Labs  09/19/14 0002 09/19/14 0418 09/19/14 0726  GLUCAP 97 96 98    ABG    Component Value Date/Time   PHART 7.307* 09/19/2014 0422   PCO2ART 42.3 09/19/2014 0422   PO2ART 83.0 09/19/2014 0422   HCO3 21.2 09/19/2014 0422   TCO2 22 09/19/2014 0422   ACIDBASEDEF 5.0* 09/19/2014 0422   O2SAT 95.0 09/19/2014 0422    CXR: PORTABLE CHEST - 1 VIEW  COMPARISON: 09/18/2014.  FINDINGS: Interim removal of endotracheal tube and NG tube. Left chest tube, mediastinal drainage catheter, and Swan-Ganz catheter stable position. Prior CABG. Stable cardiomegaly. No pulmonary venous congestion. Persistent left mid lung and left lung base subsegmental atelectasis. Tiny left pleural effusion cannot be excluded. No pneumothorax.  IMPRESSION: 1. Interim removal of endotracheal tube and NG tube. Remaining lines and tubes in stable position. No pneumothorax. 2. Stable left mid lung and left lung base subsegmental atelectasis. Tiny left pleural effusion cannot be excluded. 3. Prior CABG . Stable cardiomegaly. No pulmonary venous congestion.   Electronically Signed  By: Marcello Moores Register  On: 09/19/2014 07:44   Assessment/Plan: S/P Procedure(s) (LRB): CORONARY ARTERY BYPASS GRAFTING (CABG)times four using LIMA  to LAD:SVG to  PD and DIST RCA;SVG to OM;, EVH Right thigh (N/A) TRANSESOPHAGEAL ECHOCARDIOGRAM (TEE) (N/A)  Doing  well POD1 Maintaining NSR w/ stable hemodynamics on low dose Neo drip for BP Expected post op acute blood loss anemia with pre-op anemia, stable Expected post op atelectasis, mild Expected post op volume excess, mild GERD with Barrett's Esophagus Hemoccult positive stool Obstructive sleep apnea   Mobilize  D/C tubes and lines  Wean neo as tolerated  Hold diuretics until stable off Neo  Protonix  Transfer step down once stable of Neo   [name] 09/19/2014 8:42 AM

## 2014-09-19 NOTE — Progress Notes (Signed)
Patient examined and record reviewed.Hemodynamics stable,labs satisfactory.Patient had stable day.Continue current care. Debbie Delacruz 09/19/2014

## 2014-09-19 NOTE — Plan of Care (Signed)
Problem: Phase II - Intermediate Post-Op Goal: Advance Diet Outcome: Completed/Met Date Met:  09/19/14 Cardiac/carb modified diet.

## 2014-09-19 NOTE — Progress Notes (Signed)
    Doing well post op CABG She feels better than anticipated NSR  Candee Furbish, MD

## 2014-09-20 ENCOUNTER — Inpatient Hospital Stay (HOSPITAL_COMMUNITY): Payer: Medicare Other

## 2014-09-20 LAB — CBC
HCT: 26 % — ABNORMAL LOW (ref 36.0–46.0)
HEMOGLOBIN: 7.8 g/dL — AB (ref 12.0–15.0)
MCH: 23 pg — AB (ref 26.0–34.0)
MCHC: 30 g/dL (ref 30.0–36.0)
MCV: 76.7 fL — ABNORMAL LOW (ref 78.0–100.0)
Platelets: 138 10*3/uL — ABNORMAL LOW (ref 150–400)
RBC: 3.39 MIL/uL — ABNORMAL LOW (ref 3.87–5.11)
RDW: 23.7 % — AB (ref 11.5–15.5)
WBC: 10.1 10*3/uL (ref 4.0–10.5)

## 2014-09-20 LAB — BASIC METABOLIC PANEL
Anion gap: 6 (ref 5–15)
BUN: 15 mg/dL (ref 6–23)
CALCIUM: 8 mg/dL — AB (ref 8.4–10.5)
CO2: 26 mmol/L (ref 19–32)
CREATININE: 0.81 mg/dL (ref 0.50–1.10)
Chloride: 104 mmol/L (ref 96–112)
GFR calc Af Amer: 86 mL/min — ABNORMAL LOW (ref >90)
GFR calc non Af Amer: 74 mL/min — ABNORMAL LOW (ref >90)
GLUCOSE: 128 mg/dL — AB (ref 70–99)
Potassium: 4.4 mmol/L (ref 3.5–5.1)
Sodium: 136 mmol/L (ref 135–145)

## 2014-09-20 LAB — GLUCOSE, CAPILLARY: GLUCOSE-CAPILLARY: 131 mg/dL — AB (ref 70–99)

## 2014-09-20 MED ORDER — SODIUM CHLORIDE 0.9 % IJ SOLN
3.0000 mL | Freq: Two times a day (BID) | INTRAMUSCULAR | Status: DC
  Administered 2014-09-20 – 2014-09-23 (×4): 3 mL via INTRAVENOUS

## 2014-09-20 MED ORDER — SODIUM CHLORIDE 0.9 % IV SOLN: 250.0000 mL | INTRAVENOUS | Status: DC | PRN

## 2014-09-20 MED ORDER — FOLIC ACID 1 MG PO TABS
1.0000 mg | ORAL_TABLET | Freq: Every day | ORAL | Status: DC
  Administered 2014-09-20 – 2014-09-23 (×4): 1 mg via ORAL
  Filled 2014-09-20 (×4): qty 1

## 2014-09-20 MED ORDER — FERROUS SULFATE 325 (65 FE) MG PO TABS
325.0000 mg | ORAL_TABLET | Freq: Every day | ORAL | Status: DC
  Administered 2014-09-21 – 2014-09-23 (×3): 325 mg via ORAL
  Filled 2014-09-20 (×4): qty 1

## 2014-09-20 MED ORDER — FUROSEMIDE 40 MG PO TABS
40.0000 mg | ORAL_TABLET | Freq: Every day | ORAL | Status: AC
  Administered 2014-09-20 – 2014-09-22 (×3): 40 mg via ORAL
  Filled 2014-09-20 (×3): qty 1

## 2014-09-20 MED ORDER — SODIUM CHLORIDE 0.9 % IJ SOLN: 3.0000 mL | INTRAMUSCULAR | Status: DC | PRN

## 2014-09-20 MED ORDER — POTASSIUM CHLORIDE CRYS ER 20 MEQ PO TBCR
20.0000 meq | EXTENDED_RELEASE_TABLET | Freq: Every day | ORAL | Status: AC
  Administered 2014-09-20 – 2014-09-22 (×3): 20 meq via ORAL
  Filled 2014-09-20 (×3): qty 1

## 2014-09-20 MED ORDER — MOVING RIGHT ALONG BOOK
Freq: Once | Status: AC
  Administered 2014-09-20: 10:00:00
  Filled 2014-09-20: qty 1

## 2014-09-20 NOTE — Progress Notes (Signed)
[address]       [place] [address]             [phone]        CARDIOTHORACIC SURGERY PROGRESS NOTE   R2 Days Post-Op Procedure(s) (LRB): CORONARY ARTERY BYPASS GRAFTING (CABG)times four using LIMA  to LAD:SVG to  PD and DIST RCA;SVG to OM;, EVH Right thigh (N/A) TRANSESOPHAGEAL ECHOCARDIOGRAM (TEE) (N/A)  Subjective: Feels well.  No complaints.  Already ambulated around SICU once this morning.  Objective: Vital signs: BP Readings from Last 1 Encounters:  09/20/14 124/63   Pulse Readings from Last 1 Encounters:  09/20/14 73   Resp Readings from Last 1 Encounters:  09/20/14 27   Temp Readings from Last 1 Encounters:  09/20/14 98.1 F (36.7 C) Oral    Hemodynamics: PAP: (27)/(16) 27/16 mmHg  Physical Exam:  Rhythm:   sinus  Breath sounds: clear  Heart sounds:  RRR  Incisions:  Dressing dry, intact  Abdomen:  Soft, non-distended, non-tender  Extremities:  Warm, well-perfused    Intake/Output from previous day: 04/27 0701 - 04/28 0700 In: 1088.8 [P.O.:770; I.V.:218.8; IV Piggyback:100] Out: 665 [Urine:555; Chest Tube:110] Intake/Output this shift:    Lab Results:  CBC: Recent Labs  09/19/14 1625  09/19/14 2339 09/20/14 0420  WBC 11.9*  --   --  10.1  HGB 8.1*  < > 8.5* 7.8*  HCT 27.4*  < > 25.0* 26.0*  PLT 154  --   --  138*  < > = values in this interval not displayed.  BMET:  Recent Labs  09/19/14 0412  09/19/14 2339 09/20/14 0420  NA 137  < > 137 136  K 4.2  < > 4.4 4.4  CL 109  < > 101 104  CO2 22  --   --  26  GLUCOSE 102*  < > 129* 128*  BUN 12  < > 15 15  CREATININE 0.80  < > 0.80 0.81  CALCIUM 7.5*  --   --  8.0*  < > = values in this interval not displayed.   CBG (last 3)   Recent Labs  09/19/14 1926 09/19/14 2355 09/20/14 0351  GLUCAP 156* 130* 131*    ABG    Component Value Date/Time   PHART 7.307* 09/19/2014 0422   PCO2ART 42.3 09/19/2014 0422   PO2ART 83.0 09/19/2014 0422   HCO3  21.2 09/19/2014 0422   TCO2 21 09/19/2014 2339   ACIDBASEDEF 5.0* 09/19/2014 0422   O2SAT 95.0 09/19/2014 0422    CXR: PORTABLE CHEST - 1 VIEW  COMPARISON: 09/19/2014.  FINDINGS: Right IJ sheath in good anatomic position. Interim removal of Swan-Ganz catheter, mediastinal drainage catheter, and left chest tube. Cardiomegaly. Prior CABG. Pulmonary vascularity normal. Mild bibasilar subsegmental atelectasis. Tiny left pleural effusion cannot be excluded. No pneumothorax.  IMPRESSION: 1. Interim removal Swan-Ganz catheter, mediastinal drainage catheter, and left chest tube. No pneumothorax. 2. Prior CABG. Stable cardiomegaly. No pulmonary venous congestion. 3. Mild bibasilar subsegmental atelectasis. Tiny left pleural effusion cannot be excluded .   Electronically Signed  By: Marcello Moores Register  On: 09/20/2014 07:37   Assessment/Plan: S/P Procedure(s) (LRB): CORONARY ARTERY BYPASS GRAFTING (CABG)times four using LIMA  to LAD:SVG to  PD and DIST RCA;SVG to OM;, EVH Right thigh (N/A) TRANSESOPHAGEAL ECHOCARDIOGRAM (TEE) (N/A)  Doing well POD2 Maintaining NSR w/ stable BP Expected post op acute blood loss anemia with pre-op anemia, Hgb down slightly to 7.8 today Expected post op atelectasis,  mild Expected post op volume excess, mild GERD with Barrett's Esophagus Hemoccult positive stool Obstructive sleep apnea   Mobilize  Diuresis  Watch Hgb  Resume iron  Protonix  Transfer floor  Possible d/c home 2-3 days  [name] 09/20/2014 8:50 AM

## 2014-09-21 ENCOUNTER — Inpatient Hospital Stay (HOSPITAL_COMMUNITY): Payer: Medicare Other

## 2014-09-21 LAB — BASIC METABOLIC PANEL
Anion gap: 8 (ref 5–15)
BUN: 21 mg/dL (ref 6–23)
CO2: 26 mmol/L (ref 19–32)
Calcium: 8.1 mg/dL — ABNORMAL LOW (ref 8.4–10.5)
Chloride: 104 mmol/L (ref 96–112)
Creatinine, Ser: 0.92 mg/dL (ref 0.50–1.10)
GFR calc Af Amer: 74 mL/min — ABNORMAL LOW (ref >90)
GFR, EST NON AFRICAN AMERICAN: 63 mL/min — AB (ref >90)
Glucose, Bld: 124 mg/dL — ABNORMAL HIGH (ref 70–99)
Potassium: 3.7 mmol/L (ref 3.5–5.1)
Sodium: 138 mmol/L (ref 135–145)

## 2014-09-21 LAB — TYPE AND SCREEN
ABO/RH(D): O POS
Antibody Screen: NEGATIVE
UNIT DIVISION: 0
UNIT DIVISION: 0
UNIT DIVISION: 0
Unit division: 0
Unit division: 0
Unit division: 0

## 2014-09-21 LAB — CBC
HCT: 27 % — ABNORMAL LOW (ref 36.0–46.0)
Hemoglobin: 8 g/dL — ABNORMAL LOW (ref 12.0–15.0)
MCH: 23.3 pg — ABNORMAL LOW (ref 26.0–34.0)
MCHC: 29.6 g/dL — AB (ref 30.0–36.0)
MCV: 78.5 fL (ref 78.0–100.0)
Platelets: 168 10*3/uL (ref 150–400)
RBC: 3.44 MIL/uL — AB (ref 3.87–5.11)
RDW: 26.3 % — ABNORMAL HIGH (ref 11.5–15.5)
WBC: 9 10*3/uL (ref 4.0–10.5)

## 2014-09-21 MED ORDER — POTASSIUM CHLORIDE CRYS ER 20 MEQ PO TBCR
40.0000 meq | EXTENDED_RELEASE_TABLET | Freq: Once | ORAL | Status: AC
  Administered 2014-09-21: 40 meq via ORAL
  Filled 2014-09-21: qty 2

## 2014-09-21 MED ORDER — METOPROLOL SUCCINATE ER 50 MG PO TB24
50.0000 mg | ORAL_TABLET | Freq: Every day | ORAL | Status: DC
  Administered 2014-09-21 – 2014-09-23 (×3): 50 mg via ORAL
  Filled 2014-09-21 (×3): qty 1

## 2014-09-21 NOTE — Discharge Summary (Signed)
Physician Discharge Summary  Patient ID: Debbie Delacruz MRN: [id number] DOB/AGE: [birth year] 69 y.o.  Admit date: 09/13/2014 Discharge date: 09/23/2014  Admission Diagnoses:  Patient Active Problem List   Diagnosis Date Noted  . [name] esophagus 09/17/2014  . Duodenitis 09/17/2014  . History of gastritis   . Unstable angina 09/13/2014  . Syncope 09/13/2014  . GERD (gastroesophageal reflux disease)   . Medicare annual wellness visit, initial 03/28/2013  . Essential hypertension, benign 11/16/2012  . Anemia 11/16/2012  . Elevated blood sugar 11/16/2012  . Arthralgia 11/16/2012  . Screening for breast cancer 11/16/2012  . Special screening for malignant neoplasms, colon 11/16/2012  . Coronary artery disease 11/16/2012  . Dyslipidemia 09/15/2008  . COLONIC POLYPS, ADENOMATOUS, HX OF 02/20/2008  . ANXIETY 11/04/2006   Discharge Diagnoses:   Patient Active Problem List   Diagnosis Date Noted  . S/P CABG x 4 09/18/2014  . Bernerd Terhune's esophagus 09/17/2014  . Duodenitis 09/17/2014  . History of gastritis   . Unstable angina 09/13/2014  . Syncope 09/13/2014  . GERD (gastroesophageal reflux disease)   . Medicare annual wellness visit, initial 03/28/2013  . Essential hypertension, benign 11/16/2012  . Anemia 11/16/2012  . Elevated blood sugar 11/16/2012  . Arthralgia 11/16/2012  . Screening for breast cancer 11/16/2012  . Special screening for malignant neoplasms, colon 11/16/2012  . Coronary artery disease 11/16/2012  . Dyslipidemia 09/15/2008  . COLONIC POLYPS, ADENOMATOUS, HX OF 02/20/2008  . ANXIETY 11/04/2006   Discharged Condition: good  History of Present Illness:  [name] is a [age] year old female with known history of coronary artery disease, hypertension, hyperlipidemia, and a strong family history of coronary artery disease who has been referred for possible surgical treatment of severe multivessel coronary artery disease with unstable angina  pectoris. The patient's cardiac history dates back to 1999 when she suffered an acute inferior wall myocardial infarction just before her 52th birthday. She was hospitalized at [facility] where she underwent diagnostic cardiac catheterization followed by PCI with stenting of her right coronary artery. She recovered uneventfully and has done well until recently. She states that she was in her usual state of health until early January of this year when she developed new onset symptoms of exertional shortness of breath and chest tightness with severe fatigue. Symptoms progressed fairly rapidly over a relatively brief period of time, ultimately causing her to present to [facility]. She was found to be severely anemic at that time and transfused 2 units packed red blood cells. Details of that hospitalization are not currently available and it is unclear whether or not the patient ruled in for an acute non-ST segment elevation myocardial infarction. Stool was reportedly Hemoccult negative at that time. She was referred to a gastroenterologist who subsequently ordered an upper GI barium swallow and CT scan of the abdomen. She has been told that she had evidence of GE reflux disease with esophagitis. The patient was reportedly felt to be too high risk to undergo upper GI endoscopy.The patient was additionally seen in follow-up by her cardiologist and diagnostic cardiac catheterization was recommended. Catheterization was performed 07/11/2014 and confirmed the presence of severe multivessel coronary artery disease with 100% chronic occlusion of the left anterior descending coronary artery. Left ventricular systolic function was mildly reduced. The patient's symptoms improved following transfusion at the time of her brief hospitalization in January, but she has continued to experience nearly daily episodes of substernal chest tightness and shortness of breath consistent with  angina pectoralis that have been refractory to medical therapy.  Due to this she was felt to require surgical revascularization.  She was evaluated by Dr. Roxy Manns on 09/11/2014 at which time he was in agreement the patient would benefit from Coronary Bypass Grafting procedure.  The risks and benefits of the  procedure were explained to the patient and she was agreeable to proceed.  This was tentatively scheduled for 09/27/2014.  However, the patient presented to the ED with complaints of chest pain on 09/13/2014.  She developed chest pain with associated nausea and vomiting, however when the patient stood up to vomit she lost conscious ness.  He cardiac enzymes were negative but she was admitted for further care.      Hospital Course:   During admission the patient underwent GI workup for anemia.  She had a recent GI bleed in January which required transfusion.  She underwent EGD which was negative for active bleed.  She remained chest pain free during this hospitalization.  It was felt CABG procedure should be done during this admission.  She was taken to the operating room on 09/18/2014.  She underwent CABG x 4 utilizing LIMA to LAD, SVG to PDA, SVG to RCA, and SVG to OM.  She also underwent Endoscopic harvest of the greater saphenous vein from the right thigh.  She tolerated the procedure well and was taken to the SICU in stable condition.  She was extubated the evening of surgery.  During her stay in the SICU the patient was transfused 1 unit of packed cells for post operative anemia.  She was weaned off Neo Synephrine drip as tolerated.  Her chest tubes and arterial lines were removed without difficulty.  She was maintaining NSR and was felt stable for transfer to the floor on POD #2.   The patient continued to progress.  She continued to maintain NSR and her pacing wires were removed without difficulty.  She was hypervolemic and responded well to Lasix.  Follow up CXR does show small pleural effusions and patient  was educated on importance of IS use.  She is ambulating independently.  She is tolerating a heart healthy diet.  She is felt medically stable for discharge 09/23/2014.  Treatments: surgery:    Coronary Artery Bypass Grafting x 4 Left Internal Mammary Artery to Distal Left Anterior Descending Coronary Artery Saphenous Vein Graft to Posterior Descending Coronary Artery Sequential Saphenous Vein Graft to Distal Right Coronary Artery Saphenous Vein Graft to Obtuse Marginal Branch of Left Circumflex Coronary Artery Endoscopic Vein Harvest from Right Thigh    Disposition: 01-Home or Self Care   Discharge Medications:  The patient has been discharged on:   1.Beta Blocker:  Yes [x   ]                              No   [   ]                              If No, reason:  2.Ace Inhibitor/ARB: Yes [   ]                                     No  [ x   ]  If No, reason: Labile blood pressure  3.Statin:   Yes [ x  ]                  No  [   ]                  If No, reason:  4.Ecasa:  Yes  [  x ]                  No   [   ]                  If No, reason:     Medication List    STOP taking these medications        amLODipine 10 MG tablet  Commonly known as:  NORVASC     isosorbide mononitrate 60 MG 24 hr tablet  Commonly known as:  IMDUR     nitroGLYCERIN 0.4 MG/SPRAY spray  Commonly known as:  NITROLINGUAL      TAKE these medications        acetaminophen 500 MG tablet  Commonly known as:  TYLENOL  Take 500 mg by mouth every 6 (six) hours as needed for headache.     aspirin 325 MG EC tablet  Take 1 tablet (325 mg total) by mouth daily.     ferrous sulfate 325 (65 FE) MG tablet  Take 325 mg by mouth daily with breakfast.     FLUoxetine 20 MG capsule  Commonly known as:  PROZAC  Take 40 mg by mouth daily.     folic acid 1 MG tablet  Commonly known as:  FOLVITE  Take 1 tablet  (1 mg total) by mouth daily.     furosemide 40 MG tablet  Commonly known as:  LASIX  Take 1 tablet (40 mg total) by mouth daily. For 7 Days     metoprolol succinate 50 MG 24 hr tablet  Commonly known as:  TOPROL-XL  TAKE ONE TABLET BY MOUTH ONCE DAILY, WITH OR IMMEDIATELY FOLLOWING A MEAL.     omeprazole 20 MG tablet  Commonly known as:  PRILOSEC OTC  Take 20 mg by mouth daily.     oxyCODONE 5 MG immediate release tablet  Commonly known as:  Oxy IR/ROXICODONE  Take 1-2 tablets (5-10 mg total) by mouth every 3 (three) hours as needed for severe pain.     pantoprazole 40 MG tablet  Commonly known as:  PROTONIX  Take 1 tablet (40 mg total) by mouth 2 (two) times daily.     rosuvastatin 20 MG tablet  Commonly known as:  CRESTOR  Take 40 mg by mouth daily.     traMADol 50 MG tablet  Commonly known as:  ULTRAM  TAKE 1 OR 2 TABLETS BY MOUTH EVERY 8 HOURS AS NEEDED.       Follow-up Information    Follow up with [facility] On 10/15/2014.   Why:  Appointment is at 1:00   Contact information:   [address] [place] [id number]       Follow up with [facility] On 10/15/2014.   Why:  Please get CXR at 12:30   Contact information:   [facility]       Follow up with Yolonda Kida., MD.   Specialty:  Internal Medicine   Why:  Please contact office to set up follow up appointment at your convenience   Contact information:  [place] [address] [phone]       Signed: Ellwood Handler 09/23/2014, 9:41 AM

## 2014-09-21 NOTE — Addendum Note (Signed)
Addendum  created 09/21/14 0951 by Josephine Igo, CRNA   Modules edited: Anesthesia Medication Administration

## 2014-09-21 NOTE — Progress Notes (Signed)
UR Completed. Sabir Charters, RN, BSN.  [phone] 

## 2014-09-21 NOTE — Progress Notes (Signed)
Medicare Important Message given? YES  (If response is "NO", the following Medicare IM given date fields will be blank)  Date Medicare IM given: 09/21/14 Medicare IM given by:  Dahlia Client [employer]

## 2014-09-21 NOTE — Discharge Instructions (Signed)
Coronary Artery Bypass Grafting, Care After °Refer to this sheet in the next few weeks. These instructions provide you with information on caring for yourself after your procedure. Your health care provider may also give you more specific instructions. Your treatment has been planned according to current medical practices, but problems sometimes occur. Call your health care provider if you have any problems or questions after your procedure. °WHAT TO EXPECT AFTER THE PROCEDURE °Recovery from surgery will be different for everyone. Some people feel well after 3 or 4 weeks, while for others it takes longer. After your procedure, it is typical to have the following: °· Nausea and a lack of appetite.   °· Constipation. °· Weakness and fatigue.   °· Depression or irritability.   °· Pain or discomfort at your incision site. °HOME CARE INSTRUCTIONS °· Take medicines only as directed by your health care provider. Do not stop taking medicines or start any new medicines without first checking with your health care provider. °· Take your pulse as directed by your health care provider. °· Perform deep breathing as directed by your health care provider. If you were given a device called an incentive spirometer, use it to practice deep breathing several times a day. Support your chest with a pillow or your arms when you take deep breaths or cough. °· Keep incision areas clean, dry, and protected. Remove or change any bandages (dressings) only as directed by your health care provider. You may have skin adhesive strips over the incision areas. Do not take the strips off. They will fall off on their own. °· Check incision areas daily for any swelling, redness, or drainage. °· If incisions were made in your legs, do the following: °¨ Avoid crossing your legs.   °¨ Avoid sitting for long periods of time. Change positions every 30 minutes.   °¨ Elevate your legs when you are sitting. °· Wear compression stockings as directed by your  health care provider. These stockings help keep blood clots from forming in your legs. °· Take showers once your health care provider approves. Until then, only take sponge baths. Pat incisions dry. Do not rub incisions with a washcloth or towel. Do not take baths, swim, or use a hot tub until your health care provider approves. °· Eat foods that are high in fiber, such as raw fruits and vegetables, whole grains, beans, and nuts. Meats should be lean cut. Avoid canned, processed, and fried foods. °· Drink enough fluid to keep your urine clear or pale yellow. °· Weigh yourself every day. This helps identify if you are retaining fluid that may make your heart and lungs work harder. °· Rest and limit activity as directed by your health care provider. You may be instructed to: °¨ Stop any activity at once if you have chest pain, shortness of breath, irregular heartbeats, or dizziness. Get help right away if you have any of these symptoms. °¨ Move around frequently for short periods or take short walks as directed by your health care provider. Increase your activities gradually. You may need physical therapy or cardiac rehabilitation to help strengthen your muscles and build your endurance. °¨ Avoid lifting, pushing, or pulling anything heavier than 10 lb (4.5 kg) for at least 6 weeks after surgery. °· Do not drive until your health care provider approves.  °· Ask your health care provider when you may return to work. °· Ask your health care provider when you may resume sexual activity. °· Keep all follow-up visits as directed by your health care   provider. This is important. °SEEK MEDICAL CARE IF: °· You have swelling, redness, increasing pain, or drainage at the site of an incision. °· You have a fever. °· You have swelling in your ankles or legs. °· You have pain in your legs.   °· You gain 2 or more pounds (0.9 kg) a day. °· You are nauseous or vomit. °· You have diarrhea.  °SEEK IMMEDIATE MEDICAL CARE IF: °· You have  chest pain that goes to your jaw or arms. °· You have shortness of breath.   °· You have a fast or irregular heartbeat.   °· You notice a "clicking" in your breastbone (sternum) when you move.   °· You have numbness or weakness in your arms or legs. °· You feel dizzy or light-headed.   °MAKE SURE YOU: °· Understand these instructions. °· Will watch your condition. °· Will get help right away if you are not doing well or get worse. °Document Released: 11/28/2004 Document Revised: 09/25/2013 Document Reviewed: 10/18/2012 °ExitCare® Patient Information ©2015 [employer]. This information is not intended to replace advice given to you by your health care provider. Make sure you discuss any questions you have with your health care provider. ° °Endoscopic Saphenous Vein Harvesting °Care After °Refer to this sheet in the next few weeks. These instructions provide you with information on caring for yourself after your procedure. Your health care provider may also give you more specific instructions. Your treatment has been planned according to current medical practices, but problems sometimes occur. Call your health care provider if you have any problems or questions after your procedure. °HOME CARE INSTRUCTIONS °Medicine °· Take whatever pain medicine your surgeon prescribes. Follow the directions carefully. Do not take over-the-counter pain medicine unless your surgeon says it is okay. Some pain medicine can cause bleeding problems for several weeks after surgery. °· Follow your surgeon's instructions about driving. You will probably not be permitted to drive after heart surgery. °· Take any medicines your surgeon prescribes. Any medicines you took before your heart surgery should be checked with your health care provider before you start taking them again. °Wound care °· If your surgeon has prescribed an elastic bandage or stocking, ask how long you should wear it. °· Check the area around your surgical cuts  (incisions) whenever your bandages (dressings) are changed. Look for any redness or swelling. °· You will need to return to have the stitches (sutures) or staples taken out. Ask your surgeon when to do that. °· Ask your surgeon when you can shower or bathe. °Activity °· Try to keep your legs raised when you are sitting. °· Do any exercises your health care providers have given you. These may include deep breathing exercises, coughing, walking, or other exercises. °SEEK MEDICAL CARE IF: °· You have any questions about your medicines. °· You have more leg pain, especially if your pain medicine stops working. °· New or growing bruises develop on your leg. °· Your leg swells, feels tight, or becomes red. °· You have numbness in your leg. °SEEK IMMEDIATE MEDICAL CARE IF: °· Your pain gets much worse. °· Blood or fluid leaks from any of the incisions. °· Your incisions become warm, swollen, or red. °· You have chest pain. °· You have trouble breathing. °· You have a fever. °· You have more pain near your leg incision. °MAKE SURE YOU: °· Understand these instructions. °· Will watch your condition. °· Will get help right away if you are not doing well or get worse. °Document Released: 01/21/2011   Document Revised: 05/16/2013 Document Reviewed: 01/21/2011 °ExitCare® Patient Information ©2015 [employer]. This information is not intended to replace advice given to you by your health care provider. Make sure you discuss any questions you have with your health care provider. ° ° °

## 2014-09-21 NOTE — Progress Notes (Signed)
[id number] Offered to walk with pt twice but stated would prefer to walk with cousin after bath. Pt stated she walked from unit yesterday and did well. Stated she has rolling walker available if needed for home. Education completed with pt and cousin who voiced understanding. Encouraged her to watch post op video later. Discussed CRP 2 and permission given to send letter of interest to [place]. Graylon Good RN BSN 09/21/2014 10:56 AM .

## 2014-09-22 MED ORDER — FUROSEMIDE 40 MG PO TABS
40.0000 mg | ORAL_TABLET | Freq: Every day | ORAL | Status: DC
  Filled 2014-09-22: qty 1

## 2014-09-22 MED ORDER — FUROSEMIDE 40 MG PO TABS
40.0000 mg | ORAL_TABLET | Freq: Every day | ORAL | Status: DC
  Administered 2014-09-23: 40 mg via ORAL
  Filled 2014-09-22: qty 1

## 2014-09-22 NOTE — Progress Notes (Signed)
Epicardial pacing wires discontinued per md order and protocol.  Sutures were clipped and wires were removed without difficulty.  No drainage from wire sites.  Chest tube sutures are still in place, dry/intact.  Pt had no complaints during or following wire removal.  VS remained stable.  Will continue to monitor.

## 2014-09-22 NOTE — Progress Notes (Addendum)
      [address]       [place] [address]             [phone]      4 Days Post-Op Procedure(s) (LRB): CORONARY ARTERY BYPASS GRAFTING (CABG)times four using LIMA  to LAD:SVG to  PD and DIST RCA;SVG to OM;, EVH Right thigh (N/A) TRANSESOPHAGEAL ECHOCARDIOGRAM (TEE) (N/A)   Subjective:  Ms. Scogin has no complaints this morning.  States she is feeling good and hopeful to be discharged tomorrow.  + ambulation + BM  Objective: Vital signs in last 24 hours: Temp:  [98.3 F (36.8 C)-98.6 F (37 C)] 98.3 F (36.8 C) (04/30 0543) Pulse Rate:  [71-75] 71 (04/30 0543) Cardiac Rhythm:  [-] Normal sinus rhythm (04/30 0327) Resp:  [18] 18 (04/30 0543) BP: (108-138)/(57-69) 137/69 mmHg (04/30 0543) SpO2:  [95 %-97 %] 95 % (04/30 0543) Weight:  [189 lb 14.4 oz (86.138 kg)] 189 lb 14.4 oz (86.138 kg) (04/30 0543)  Intake/Output from previous day: 04/29 0701 - 04/30 0700 In: 360 [P.O.:360] Out: 1 [Stool:1]  General appearance: alert, cooperative and no distress Heart: regular rate and rhythm Lungs: clear to auscultation bilaterally Abdomen: soft, non-tender; bowel sounds normal; no masses,  no organomegaly Extremities: edema trace Wound: clean and dry, some ecchymosis at [facility] site  Lab Results:  Recent Labs  09/20/14 0420 09/21/14 0610  WBC 10.1 9.0  HGB 7.8* 8.0*  HCT 26.0* 27.0*  PLT 138* 168   BMET:  Recent Labs  09/20/14 0420 09/21/14 0610  NA 136 138  K 4.4 3.7  CL 104 104  CO2 26 26  GLUCOSE 128* 124*  BUN 15 21  CREATININE 0.81 0.92  CALCIUM 8.0* 8.1*    PT/INR: No results for input(s): LABPROT, INR in the last 72 hours. ABG    Component Value Date/Time   PHART 7.307* 09/19/2014 0422   HCO3 21.2 09/19/2014 0422   TCO2 21 09/19/2014 2339   ACIDBASEDEF 5.0* 09/19/2014 0422   O2SAT 95.0 09/19/2014 0422   CBG (last 3)   Recent Labs  09/19/14 1926 09/19/14 2355 09/20/14 0351  GLUCAP 156* 130* 131*    Assessment/Plan: S/P  Procedure(s) (LRB): CORONARY ARTERY BYPASS GRAFTING (CABG)times four using LIMA  to LAD:SVG to  PD and DIST RCA;SVG to OM;, EVH Right thigh (N/A) TRANSESOPHAGEAL ECHOCARDIOGRAM (TEE) (N/A)  1. CV- NSR continue Lopressor 2. Pulm- off oxygen, no acute issues 3. Renal- creatinine WNL, remains hypervolemic weight is up 10 lbs since admission, continue Lasix 4. Expected Blood Loss Anemia- stable, Hgb is 8.0, continue iron, no further evidence of melena 5. Dispo- patient progressing well, d/c EPW today, likely home in AM   LOS: 9 days    BARRETT, ERIN 09/22/2014  I have seen and examined [name] and agree with the above assessment  and plan.  Grace Isaac MD [facility] [phone] Office [phone] 09/22/2014 2:23 PM

## 2014-09-22 NOTE — Progress Notes (Signed)
CARDIAC REHAB PHASE I   PRE:  Rate/Rhythm: 74 SR  BP:  Supine:   Sitting: 130/70  Standing:    SaO2: 95%RA  MODE:  Ambulation: 450 ft   POST:  Rate/Rhythm: 86  BP:  Supine: 160/78  Sitting:   Standing:    SaO2: 97%RA 0940-1002 Pt walked 450 ft on RA with rolling walker with steady gait. Stopped a couple of times to rest. To bed after walk. Pt knows to walk at least two more times today.   Graylon Good, RN BSN  09/22/2014 10:00 AM

## 2014-09-23 MED ORDER — ASPIRIN 325 MG PO TBEC: 325.0000 mg | DELAYED_RELEASE_TABLET | Freq: Every day | ORAL | Status: DC

## 2014-09-23 MED ORDER — FOLIC ACID 1 MG PO TABS: 1.0000 mg | ORAL_TABLET | Freq: Every day | ORAL | Status: DC

## 2014-09-23 MED ORDER — OXYCODONE HCL 5 MG PO TABS: 5.0000 mg | ORAL_TABLET | ORAL | Status: DC | PRN

## 2014-09-23 MED ORDER — FUROSEMIDE 40 MG PO TABS: 40.0000 mg | ORAL_TABLET | Freq: Every day | ORAL | Status: DC

## 2014-09-23 NOTE — Discharge Summary (Signed)
PATIENT NAME:  Debbie Delacruz, Debbie Delacruz MR#:  [id number] DATE OF BIRTH:  [birth year]  DATE OF ADMISSION:  06/05/2014 DATE OF DISCHARGE:  06/06/2014  PRIMARY CARE PHYSICIAN:  Anderson Malta A. Gilford Rile, MD   FINAL DIAGNOSES: 1. Symptomatic anemia. Iron deficiency anemia from pica, eating ice.  2. Elevated troponin. Demand ischemia from anemia.  3. Hypertension.  4. Hypokalemia.  5. Hyperlipidemia, unspecified.  6. Impaired fasting glucose, but not a diabetic.   MEDICATIONS ON DISCHARGE: Include Norvasc 10 mg daily, tramadol 50 mg every 4 hours as needed for pain, aspirin 81 mg daily, Prozac 20 mg 2 capsules daily, metoprolol ER 50 mg daily, Crestor 5 mg once a day, ferrous sulfate 325 mg twice a day, ranitidine 150 mg twice a day. Stop taking hydrochlorothiazide.   HOME HEALTH: None.   HOME OXYGEN: No.   DIET: Low-sodium diet, regular consistency.   ACTIVITY: As tolerated.   FOLLOWUP INSTRUCTIONS: Follow-up with your gastroenterologist as outpatient for a possible EGD. Follow up in 2-4 weeks with Dr. Ronette Deter.   HOSPITAL COURSE: The patient was admitted 06/05/2014 and discharged 06/06/2014, admitted as an observation for shortness of breath, no energy, and heart racing. Found to be acute anemia and was guaiac negative. Iron studies were sent off. The patient had an elevated troponin.   LABORATORY AND RADIOLOGICAL DATA: Vitamin B12 of 310, reticulocyte count 3.99, iron saturation 4%, iron serum 20, ferritin 3, TIBC 532. Urinalysis: Negative. Troponin: Borderline at 0.68. White blood cell count 6.7, H and H 7.0 and 25.5, platelet count of 291, MCV 58. Glucose 202, BUN 16, creatinine 1.06, sodium 137, potassium 3.3, chloride 102, CO2 of 25, calcium 8.3. Liver function tests: Normal range. EKG: Normal sinus rhythm, nonspecific ST or T-wave changes. Chest x-ray: No active lung disease. The patient's troponin trended down to 0.53 x 2 more times. Hemoglobin A1c 5.6, creatinine upon discharge 1.05, glucose  106, hematocrit is 32.2, hemoglobin 9.5, platelet count 269. White blood cell count 6.2.   HOSPITAL COURSE PER PROBLEM LIST:  1. For the patient's symptomatic anemia. With very limited activity, the patient was short of breath, found to have a hemoglobin of 7.0, was transfused 2 units of packed red blood cells. Hemoglobin came up to 9.5, and the patient was feeling symptomatically better and was discharged home in stable condition. I sent off iron studies. She has severe iron deficiency anemia, most likely is secondary to pica ice eating. I told her to stop eating ice. The patient was guaiac-negative, but further GI work-up can be done as an outpatient with guaiac stools and possible endoscopy. She had a colonoscopy last year. I have seen people with pica ice eating have a severe iron deficiency anemia. I did give the patient IV iron of Venofer on the morning of January 13th and oral iron upon discharge home. The patient will follow-up with Dr. Ronette Deter and close follow up on blood counts as an outpatient.  2. Elevated troponin. This is demand ischemia from anemia. Because she was symptomatic, she felt much better upon going home, I did keep her aspirin and metoprolol going with her history of coronary artery disease.  3. Hypertension. Blood pressure stable. I got rid of the hydrochlorothiazide.  4. Hypokalemia. I got rid of the hydrochlorothiazide, replaced potassium.  5. Hyperlipidemia, unspecified, continue Crestor.  6. Impaired fasting glucose. The patient is not a diabetic. First sugar was elevated, second sugar was normal range. Hemoglobin A1c was not a diabetic at this point. Follow up as  outpatient.   TIME SPENT ON DISCHARGE: 35 minutes.    ____________________________ Tana Conch. Leslye Peer, MD rjw:mw D: 06/06/2014 16:27:52 ET T: 06/06/2014 17:23:04 ET JOB#: [id number]  cc: Tana Conch. Leslye Peer, MD, <Dictator> Eduard Clos. Gilford Rile, MD Marisue Brooklyn MD ELECTRONICALLY SIGNED 06/12/2014  8:31

## 2014-09-23 NOTE — Progress Notes (Signed)
Patient sutures removed.  Discharge paperwork and prescriptions given.

## 2014-09-23 NOTE — H&P (Signed)
PATIENT NAME:  Debbie Delacruz, Debbie Delacruz MR#:  [id number] DATE OF BIRTH:  [birth year]  DATE OF ADMISSION:  06/05/2014  PRIMARY CARE PHYSICIAN: Anderson Malta A. Gilford Rile, MD  CARDIOLOGIST: Mertie Clause. Fletcher Anon, MD  CHIEF COMPLAINT: Shortness of breath. No energy. Heart racing.   HISTORY OF PRESENT ILLNESS: This is a 69 year old female with a history of heart disease. She presents to the ER which shortness of breath, no energy, heart racing, dizziness with standing, and unable to walk very far without shortness of breath. All these symptoms have been going on for a few weeks. She came to the ER for further evaluation. In the ER, she was found to have a hemoglobin of 7 with an MCV of 58 and a borderline troponin at 0.68. Hospitalist services were contacted for further evaluation.   The patient has no nausea or vomiting. No abdominal pain. No bright red blood per rectum. No melena. In the ER, she was guaiac-negative. As per the patient last year she had a colonoscopy that was negative; no polyps were seen. She has never had an endoscopy. The patient does admit to eating ice and also eating Maalox tablets. Hospitalist services were contacted for further evaluation.   PAST MEDICAL HISTORY: Anemia, sleep apnea, hypertension, hyperlipidemia, coronary artery disease.   PAST SURGICAL HISTORY: Appendectomy, hysterectomy, cholecystectomy, bladder tuck, carpal tunnel on the left.   ALLERGIES: No known drug allergies.   MEDICATIONS: As per prescription writer include: Aspirin 81 mg daily, Crestor 5 mg daily, hydrochlorothiazide 50 mg daily, metoprolol ER 50 mg daily, Norvasc 10 mg daily, Prozac 20 mg daily, tramadol 50 mg as needed.   SOCIAL HISTORY: No smoking. Very rare alcohol. No drug use. Is a paralegal.   FAMILY HISTORY: Father with chronic obstructive pulmonary disease and MI. Mother died of CVA at age 91.   REVIEW OF SYSTEMS:  CONSTITUTIONAL: Slight weight loss. No fever, chills, or sweats. Positive for fatigue.   EYES: No blurry vision. Ears, nose, mouth and throat: No hearing loss. No sore throat. No difficulty swallowing. CARDIOVASCULAR: No chest pain. Positive for palpitations.  RESPIRATORY: Positive for shortness of breath with activity.  Slight cough. No sputum. No hemoptysis.  GASTROINTESTINAL: No nausea. No vomiting. No abdominal pain. No diarrhea. No constipation. No bright red blood per rectum. No melena.  GENITOURINARY: No burning on urination. No hematuria.  MUSCULOSKELETAL: No joint pain or muscle pain.  INTEGUMENT: Occasional itching on the arms and neck.  NEUROLOGIC: No fainting or blackouts.  PSYCHIATRIC: On medication for depression.  ENDOCRINE: No thyroid problems.  HEMATOLOGIC AND LYMPHATIC: Positive for anemia in the past.   PHYSICAL EXAMINATION:  VITAL SIGNS: On presentation to the ER, included temperature of 98.5, pulse 95, respirations 20, blood pressure 144/79, pulse oximetry 100% on room air. The patient was not orthostatic.  GENERAL: No respiratory distress.  EYES: Conjunctivae pale. Lids normal. Pupils equal, round, and reactive to light. Extraocular muscles intact. No nystagmus.  EARS, NOSE, MOUTH AND THROAT: Tympanic Membranes: No erythema. Nasal Mucosa: No erythema. Throat: No erythema. No exudate seen. Lips and Gums: No lesions.  NECK: No JVD. No bruits. No lymphadenopathy. No thyromegaly. No thyroid nodules palpated.  LUNGS: Clear to auscultation. No use of accessory muscles to breathe. No rhonchi, rales, or wheeze heard.  CARDIOVASCULAR: S1, S2 normal. No gallops, rubs, or murmurs heard. Carotid upstroke 2+ bilaterally. No bruits. Dorsalis pedis pulses 2+ bilaterally. No edema of the lower extremities.  ABDOMEN: Soft, nontender. No organomegaly/splenomegaly. Normoactive bowel sounds. No masses felt.  RECTAL: ER physician did rectal exam, which was guaiac-negative.  EXTREMITIES: No clubbing, edema, or cyanosis.  SKIN: No rashes or ulcers seen.  NEUROLOGIC: Cranial  nerves II through XII grossly intact. Deep tendon reflexes 2+ bilateral lower extremities.  PSYCHIATRIC: The patient is oriented to person, place, and time.   LABORATORY AND RADIOLOGICAL DATA: Chest x-ray showed no active disease. Glucose 202, BUN 16, creatinine 1.06, sodium 137, potassium 3.3, chloride 102, CO2 of 25, calcium 8.3. Liver function tests normal range. White blood cell count 6.7, H and H 7.0 and 25.5, platelet count of 291,000, MCV 58. Troponin borderline at 0.68. Urinalysis negative.   ASSESSMENT AND PLAN:  1.  Acute anemia. The patient is guaiac-negative. I will send off iron studies to figure out what type of anemia this is. With her history of the eating ice, which is pica, I have seen severe iron deficiency anemia because of this. It would not surprise me if her ferritin is very low. I will send off a stool guaiac again, if she has a bowel movement here. The ER physician ordered 2 units of blood. I will spread that out over 4 hours instead of the 1 hour that he ordered. I consented for blood also; benefits and risks explained, since the patient is symptomatic with her anemia, with shortness of breath with any activity.  2.  Elevated troponin. This is likely demand ischemia. I will put her on telemetry. Continue at the aspirin for now, metoprolol and statin.  3.  Hypertension. Blood pressure currently stable. Will hold hydrochlorothiazide with the patient's low potassium at this point.  4.  Hyperlipidemia. We will check a lipid profile in the morning; continue statin. Hyperlipidemia is unspecified.  5.  Hypokalemia, likely secondary to high-dose hydrochlorothiazide. Will stop the hydrochlorothiazide and replace potassium.  6.  Impaired fasting glucose with a glucose over 200. We will check a hemoglobin A1c and put her on sliding scale at this point.   TIME SPENT ON ADMISSION: 55 minutes.   CODE STATUS: The patient is a Full Code.    ____________________________ Tana Conch.  Leslye Peer, MD rjw:MT D: 06/05/2014 15:34:51 ET T: 06/05/2014 16:04:58 ET JOB#: [id number]  cc: Tana Conch. Leslye Peer, MD, <Dictator> Eduard Clos. Gilford Rile, MD Mertie Clause. Fletcher Anon, MD Marisue Brooklyn MD ELECTRONICALLY SIGNED 06/06/2014 17:00

## 2014-09-24 ENCOUNTER — Ambulatory Visit (HOSPITAL_COMMUNITY): Payer: BLUE CROSS/BLUE SHIELD

## 2014-09-24 ENCOUNTER — Encounter: Payer: Self-pay | Admitting: Thoracic Surgery (Cardiothoracic Vascular Surgery)

## 2014-09-24 ENCOUNTER — Encounter (HOSPITAL_COMMUNITY): Payer: Self-pay

## 2014-09-24 ENCOUNTER — Other Ambulatory Visit (HOSPITAL_COMMUNITY): Payer: Self-pay

## 2014-09-24 ENCOUNTER — Emergency Department: Payer: Medicare Other

## 2014-09-24 ENCOUNTER — Encounter: Payer: Self-pay | Admitting: *Deleted

## 2014-09-24 ENCOUNTER — Emergency Department
Admission: EM | Admit: 2014-09-24 | Discharge: 2014-09-24 | Disposition: A | Discharge status: Home / Self Care | Payer: Medicare Other | Attending: Emergency Medicine | Admitting: Emergency Medicine

## 2014-09-24 ENCOUNTER — Other Ambulatory Visit: Payer: Self-pay

## 2014-09-24 DIAGNOSIS — Z79899 Other long term (current) drug therapy: Secondary | ICD-10-CM | POA: Insufficient documentation

## 2014-09-24 DIAGNOSIS — F419 Anxiety disorder, unspecified: Secondary | ICD-10-CM | POA: Insufficient documentation

## 2014-09-24 DIAGNOSIS — I10 Essential (primary) hypertension: Secondary | ICD-10-CM | POA: Diagnosis not present

## 2014-09-24 DIAGNOSIS — Z7982 Long term (current) use of aspirin: Secondary | ICD-10-CM | POA: Diagnosis not present

## 2014-09-24 DIAGNOSIS — K5900 Constipation, unspecified: Secondary | ICD-10-CM | POA: Diagnosis present

## 2014-09-24 MED ORDER — PROMETHAZINE HCL 25 MG PO TABS
ORAL_TABLET | ORAL | Status: AC
  Filled 2014-09-24: qty 1

## 2014-09-24 MED ORDER — ONDANSETRON 8 MG PO TBDP
ORAL_TABLET | ORAL | Status: AC
  Administered 2014-09-24: 8 mg via SUBLINGUAL
  Filled 2014-09-24: qty 1

## 2014-09-24 MED ORDER — ACETAMINOPHEN 500 MG PO TABS
ORAL_TABLET | ORAL | Status: AC
Start: 2014-09-24 — End: 2014-09-24
  Filled 2014-09-24: qty 2

## 2014-09-24 MED ORDER — LIDOCAINE HCL 2 % EX GEL
CUTANEOUS | Status: AC
  Administered 2014-09-24: 15:00:00
  Filled 2014-09-24: qty 10

## 2014-09-24 MED ORDER — ACETAMINOPHEN 500 MG PO TABS
1000.0000 mg | ORAL_TABLET | Freq: Once | ORAL | Status: AC
  Administered 2014-09-24: 1000 mg via ORAL

## 2014-09-24 MED ORDER — ONDANSETRON 8 MG PO TBDP: 8.0000 mg | ORAL_TABLET | Freq: Once | ORAL | Status: AC

## 2014-09-24 MED ORDER — PROMETHAZINE HCL 25 MG PO TABS
25.0000 mg | ORAL_TABLET | Freq: Once | ORAL | Status: AC
  Administered 2014-09-24: 25 mg via ORAL

## 2014-09-24 NOTE — ED Provider Notes (Signed)
[facility] Emergency Department Provider Note   ____________________________________________  Time see  I have reviewed the triage vital signs and the nursing notes.   HISTORY  Chief Complaint Constipation     HPI Debbie Delacruz is a 69 y.o. female who reports she had a CABG on this past Tuesday and has not stooled since patient says she is having a lot of pressure in her rectum when she tries to his stool she gets pain in her belly and chest and she has not been able to pass any stool since surgery she has no other complaints and feels fine otherwise patient is currently in the hallway and I cannot do a rectal exam on her there     Past Medical History  Diagnosis Date  . Hypertension   . Anxiety   . Hyperlipidemia   . GERD (gastroesophageal reflux disease)   . Anemia   . Coronary artery disease     2x stents, Dr. Clayborn Bigness  . Family history of adverse reaction to anesthesia     "brother; S/P lipotripsy in [place]; transferred to [facility]; had to put him on life support for 2 days"  . PONV (postoperative nausea and vomiting)   . Anginal pain   . Myocardial infarct, old 1999    15 years ago  . Sleep apnea     "don't use a mask" (09/13/2014)  . History of blood transfusion 05/2014    "we haven't figured out why I needed it yet" (09/13/2014)  . History of hiatal hernia   . Headache     "weekly" (09/13/2014)  . S/P CABG x 4 09/18/2014    LIMA to LAD, SVG to PDA-dRCA sequentially, SVG to OM, EVH via right thigh     Patient Active Problem List   Diagnosis Date Noted  . S/P CABG x 4 09/18/2014  . Barrett's esophagus 09/17/2014  . Duodenitis 09/17/2014  . History of gastritis   . Unstable angina 09/13/2014  . Syncope 09/13/2014  . GERD (gastroesophageal reflux disease)   . Medicare annual wellness visit, initial 03/28/2013  . Essential hypertension, benign 11/16/2012  . Anemia 11/16/2012  . Elevated blood sugar 11/16/2012  .  Arthralgia 11/16/2012  . Screening for breast cancer 11/16/2012  . Special screening for malignant neoplasms, colon 11/16/2012  . Coronary artery disease 11/16/2012  . Dyslipidemia 09/15/2008  . COLONIC POLYPS, ADENOMATOUS, HX OF 02/20/2008  . ANXIETY 11/04/2006    Past Surgical History  Procedure Laterality Date  . Incontinence surgery    . Cataract extraction w/ intraocular lens  implant, bilateral    . Carpal tunnel release Left   . Back surgery    . Laparoscopic cholecystectomy    . Inguinal hernia repair Right X 3    "last one was in the 1990's; still have hernia there now" (09/13/2014)  . Coronary angioplasty with stent placement  1999    [facility] x2 stent  . Cardiac catheterization  06/2014  . Appendectomy    . Vaginal hysterectomy    . Lumbar disc surgery  1990's?  . Esophagogastroduodenoscopy N/A 09/17/2014    Procedure: ESOPHAGOGASTRODUODENOSCOPY (EGD);  Surgeon: Inda Castle, MD;  Location: [place];  Service: Endoscopy;  Laterality: N/A;  . Coronary artery bypass graft N/A 09/18/2014    Procedure: CORONARY ARTERY BYPASS GRAFTING (CABG)times four using LIMA  to LAD:SVG to  PD and DIST RCA;SVG to OM;, EVH Right thigh;  Surgeon: [name], MD;  Location: [place];  Service: Open Heart Surgery;  Laterality: N/A;  . Tee without cardioversion N/A 09/18/2014    Procedure: TRANSESOPHAGEAL ECHOCARDIOGRAM (TEE);  Surgeon: [name], MD;  Location: [place];  Service: Open Heart Surgery;  Laterality: N/A;    Current Outpatient Rx  Name  Route  Sig  Dispense  Refill  . acetaminophen (TYLENOL) 500 MG tablet   Oral   Take 500 mg by mouth every 6 (six) hours as needed for headache.         Marland Kitchen aspirin EC 325 MG EC tablet   Oral   Take 1 tablet (325 mg total) by mouth daily.   30 tablet   0   . ferrous sulfate 325 (65 FE) MG tablet   Oral   Take 325 mg by mouth daily with breakfast.          . FLUoxetine (PROZAC) 20 MG capsule   Oral   Take 40 mg by mouth daily.          . folic acid (FOLVITE) 1 MG tablet   Oral   Take 1 tablet (1 mg total) by mouth daily.   30 tablet   0   . furosemide (LASIX) 40 MG tablet   Oral   Take 1 tablet (40 mg total) by mouth daily. For 7 Days   7 tablet   0   . metoprolol succinate (TOPROL-XL) 50 MG 24 hr tablet      TAKE ONE TABLET BY MOUTH ONCE DAILY, WITH OR IMMEDIATELY FOLLOWING A MEAL.   90 tablet   1   . omeprazole (PRILOSEC OTC) 20 MG tablet   Oral   Take 20 mg by mouth daily.         Marland Kitchen oxyCODONE (OXY IR/ROXICODONE) 5 MG immediate release tablet   Oral   Take 1-2 tablets (5-10 mg total) by mouth every 3 (three) hours as needed for severe pain.   30 tablet   0   . pantoprazole (PROTONIX) 40 MG tablet   Oral   Take 1 tablet (40 mg total) by mouth 2 (two) times daily.   60 tablet   0   . rosuvastatin (CRESTOR) 20 MG tablet   Oral   Take 40 mg by mouth daily.          . traMADol (ULTRAM) 50 MG tablet      TAKE 1 OR 2 TABLETS BY MOUTH EVERY 8 HOURS AS NEEDED. Patient taking differently: TAKE 1 OR 2 TABLETS BY MOUTH EVERY 8 HOURS AS NEEDED FOR PAIN   180 tablet   1     Allergies Lipitor  Family History  Problem Relation Age of Onset  . Heart disease Mother   . Stroke Mother   . Heart attack Mother   . Hypertension Mother   . COPD Father   . Heart disease Father   . Heart attack Father   . Glaucoma Father   . Osteoporosis Father   . Emphysema Father   . Cancer Sister 35    colon  . Hypertension Sister   . Stroke Brother   . Colon cancer Brother   . Hypertension Brother   . Esophageal cancer Neg Hx   . Stomach cancer Neg Hx   . Rectal cancer Neg Hx   . Stroke Maternal Grandmother   . Osteoporosis Maternal Grandmother   . Hypertension Maternal Grandmother   . Hypertension Paternal Grandmother   . Hypertension Paternal Grandfather     Social History History  Substance Use Topics  . Smoking status: Never Smoker   . Smokeless tobacco: Never Used  . Alcohol Use: Yes      Comment: 09/13/2014 "might have a drink a couple times/yr"    Review of Systems  Constitutional: Negative for fever. Eyes: Negative for visual changes. ENT: Negative for sore throat. Cardiovascular: Negative for chest pain. Respiratory: Negative for shortness of breath. Gastrointestinal: Negative for abdominal pain, vomiting and diarrhea. Genitourinary: Negative for dysuria. Musculoskeletal: Negative for back pain. Skin: Negative for rash. Neurological: Negative for headaches, focal weakness or numbness.   10-point ROS otherwise negative.  ____________________________________________   PHYSICAL EXAM:  VITAL SIGNS: ED Triage Vitals  Enc Vitals Group     BP 09/24/14 1210 167/75 mmHg     Pulse Rate 09/24/14 1210 80     Resp 09/24/14 1217 20     Temp 09/24/14 1210 98.6 F (37 C)     Temp Source 09/24/14 1210 Oral     SpO2 09/24/14 1210 97 %     Weight --      Height --      Head Cir --      Peak Flow --      Pain Score 09/24/14 1217 6     Pain Loc --      Pain Edu? --      Excl. in [place]? --      Constitutional: Alert and oriented. Well appearing and in no distress. Eyes: Conjunctivae are normal. PERRL. Normal extraocular movements. ENT   Head: Normocephalic and atraumatic.   Nose: No congestion/rhinnorhea.   Mouth/Throat: Mucous membranes are moist.   Neck: No stridor. Hematological/Lymphatic/Immunilogical: No cervical lymphadenopathy. Cardiovascular: Normal rate, regular rhythm. Normal and symmetric distal pulses are present in all extremities. No murmurs, rubs, or gallops. Respiratory: Normal respiratory effort without tachypnea nor retractions. Breath sounds are clear and equal bilaterally. No wheezes/rales/rhonchi. Gastrointestinal: Soft and nontender. No distention. No abdominal bruits. There is no CVA tenderness. Musculoskeletal: Nontender with normal range of motion in all extremities. No joint effusions.  No lower extremity tenderness nor  edema. Neurologic:  Normal speech and language. No gross focal neurologic deficits are appreciated. Speech is normal. . Skin:  Skin is warm, dry and intact. No rash noted. Psychiatric: Mood and affect are normal. Speech and behavior are normal. Patient exhibits appropriate insight and judgment. Rectal exam Hemoccult negative patient appears to be very tender there are no hemorrhoids or obvious erythema there is no crepitus does have a large soft bolus of stool in the rectum  ____________________________________________   EKG    ____________________________________________    RADIOLOGY   KUB results reviewed pictures reviewed as well there is stool in the rectum on the x-ray  ____________________________________________   PROCEDURES  Procedure(s) performed: None  Critical Care performed: No  ____________________________________________   INITIAL IMPRESSION / ASSESSMENT AND PLAN / ED COURSE  Pertinent labs & imaging results that were available during my care of the patient were reviewed by me and considered in my medical decision making (see chart for details).    ____________________________________________   FINAL CLINICAL IMPRESSION(S) / ED DIAGNOSES  Final diagnoses:  None   [facility] Emergency Department Provider Note   ____________________________________________  Time seen:   I have reviewed the triage vital signs and the nursing notes.   HISTORY  Chief Complaint Constipation       HPI Debbie Delacruz is a 69 y.o. female    Past Medical History  Diagnosis  Date  . Hypertension   . Anxiety   . Hyperlipidemia   . GERD (gastroesophageal reflux disease)   . Anemia   . Coronary artery disease     2x stents, Dr. Clayborn Bigness  . Family history of adverse reaction to anesthesia     "brother; S/P lipotripsy in [place]; transferred to [facility]; had to put him on life support for 2 days"  . PONV  (postoperative nausea and vomiting)   . Anginal pain   . Myocardial infarct, old 1999    15 years ago  . Sleep apnea     "don't use a mask" (09/13/2014)  . History of blood transfusion 05/2014    "we haven't figured out why I needed it yet" (09/13/2014)  . History of hiatal hernia   . Headache     "weekly" (09/13/2014)  . S/P CABG x 4 09/18/2014    LIMA to LAD, SVG to PDA-dRCA sequentially, SVG to OM, EVH via right thigh     Patient Active Problem List   Diagnosis Date Noted  . S/P CABG x 4 09/18/2014  . Barrett's esophagus 09/17/2014  . Duodenitis 09/17/2014  . History of gastritis   . Unstable angina 09/13/2014  . Syncope 09/13/2014  . GERD (gastroesophageal reflux disease)   . Medicare annual wellness visit, initial 03/28/2013  . Essential hypertension, benign 11/16/2012  . Anemia 11/16/2012  . Elevated blood sugar 11/16/2012  . Arthralgia 11/16/2012  . Screening for breast cancer 11/16/2012  . Special screening for malignant neoplasms, colon 11/16/2012  . Coronary artery disease 11/16/2012  . Dyslipidemia 09/15/2008  . COLONIC POLYPS, ADENOMATOUS, HX OF 02/20/2008  . ANXIETY 11/04/2006    Past Surgical History  Procedure Laterality Date  . Incontinence surgery    . Cataract extraction w/ intraocular lens  implant, bilateral    . Carpal tunnel release Left   . Back surgery    . Laparoscopic cholecystectomy    . Inguinal hernia repair Right X 3    "last one was in the 1990's; still have hernia there now" (09/13/2014)  . Coronary angioplasty with stent placement  1999    [facility] x2 stent  . Cardiac catheterization  06/2014  . Appendectomy    . Vaginal hysterectomy    . Lumbar disc surgery  1990's?  . Esophagogastroduodenoscopy N/A 09/17/2014    Procedure: ESOPHAGOGASTRODUODENOSCOPY (EGD);  Surgeon: Inda Castle, MD;  Location: [place];  Service: Endoscopy;  Laterality: N/A;  . Coronary artery bypass graft N/A 09/18/2014    Procedure: CORONARY ARTERY BYPASS GRAFTING  (CABG)times four using LIMA  to LAD:SVG to  PD and DIST RCA;SVG to OM;, EVH Right thigh;  Surgeon: [name], MD;  Location: [place];  Service: Open Heart Surgery;  Laterality: N/A;  . Tee without cardioversion N/A 09/18/2014    Procedure: TRANSESOPHAGEAL ECHOCARDIOGRAM (TEE);  Surgeon: [name], MD;  Location: [place];  Service: Open Heart Surgery;  Laterality: N/A;    Current Outpatient Rx  Name  Route  Sig  Dispense  Refill  . acetaminophen (TYLENOL) 500 MG tablet   Oral   Take 500 mg by mouth every 6 (six) hours as needed for headache.         Marland Kitchen aspirin EC 325 MG EC tablet   Oral   Take 1 tablet (325 mg total) by mouth daily.   30 tablet   0   . ferrous sulfate 325 (65 FE) MG tablet   Oral   Take  325 mg by mouth daily with breakfast.          . FLUoxetine (PROZAC) 20 MG capsule   Oral   Take 40 mg by mouth daily.         . folic acid (FOLVITE) 1 MG tablet   Oral   Take 1 tablet (1 mg total) by mouth daily.   30 tablet   0   . furosemide (LASIX) 40 MG tablet   Oral   Take 1 tablet (40 mg total) by mouth daily. For 7 Days   7 tablet   0   . metoprolol succinate (TOPROL-XL) 50 MG 24 hr tablet      TAKE ONE TABLET BY MOUTH ONCE DAILY, WITH OR IMMEDIATELY FOLLOWING A MEAL.   90 tablet   1   . omeprazole (PRILOSEC OTC) 20 MG tablet   Oral   Take 20 mg by mouth daily.         Marland Kitchen oxyCODONE (OXY IR/ROXICODONE) 5 MG immediate release tablet   Oral   Take 1-2 tablets (5-10 mg total) by mouth every 3 (three) hours as needed for severe pain.   30 tablet   0   . pantoprazole (PROTONIX) 40 MG tablet   Oral   Take 1 tablet (40 mg total) by mouth 2 (two) times daily.   60 tablet   0   . rosuvastatin (CRESTOR) 20 MG tablet   Oral   Take 40 mg by mouth daily.          . traMADol (ULTRAM) 50 MG tablet      TAKE 1 OR 2 TABLETS BY MOUTH EVERY 8 HOURS AS NEEDED. Patient taking differently: TAKE 1 OR 2 TABLETS BY MOUTH EVERY 8 HOURS AS NEEDED FOR PAIN    180 tablet   1     Allergies Lipitor  Family History  Problem Relation Age of Onset  . Heart disease Mother   . Stroke Mother   . Heart attack Mother   . Hypertension Mother   . COPD Father   . Heart disease Father   . Heart attack Father   . Glaucoma Father   . Osteoporosis Father   . Emphysema Father   . Cancer Sister 59    colon  . Hypertension Sister   . Stroke Brother   . Colon cancer Brother   . Hypertension Brother   . Esophageal cancer Neg Hx   . Stomach cancer Neg Hx   . Rectal cancer Neg Hx   . Stroke Maternal Grandmother   . Osteoporosis Maternal Grandmother   . Hypertension Maternal Grandmother   . Hypertension Paternal Grandmother   . Hypertension Paternal Grandfather     Social History History  Substance Use Topics  . Smoking status: Never Smoker   . Smokeless tobacco: Never Used  . Alcohol Use: Yes     Comment: 09/13/2014 "might have a drink a couple times/yr"    Review of Systems  Review of systems negative except for as noted in history of present illness  10-point ROS otherwise negative.  ____________________________________________   PHYSICAL EXAM:  VITAL SIGNS: ED Triage Vitals  Enc Vitals Group     BP 09/24/14 1210 167/75 mmHg     Pulse Rate 09/24/14 1210 80     Resp 09/24/14 1217 20     Temp 09/24/14 1210 98.6 F (37 C)     Temp Source 09/24/14 1210 Oral     SpO2 09/24/14 1210 97 %  Weight --      Height --      Head Cir --      Peak Flow --      Pain Score 09/24/14 1217 6     Pain Loc --      Pain Edu? --      Excl. in [place]? --     Constitutional: Alert and oriented. Well appearing and in no distress. Eyes: Conjunctivae are normal. PERRL. Normal extraocular movements. ENT   Head: Normocephalic and atraumatic.   Nose: No congestion/rhinnorhea.   Mouth/Throat: Mucous membranes are moist.   Neck: No stridor. Hematological/Lymphatic/Immunilogical: No cervical lymphadenopathy. Cardiovascular: Normal  rate, regular rhythm. Normal and symmetric distal pulses are present in all extremities. No murmurs, rubs, or gallops. Chest has a large midline scar from her CABG this is healing nicely and does not appear to be tender Respiratory: Normal respiratory effort without tachypnea nor retractions. Breath sounds are clear and equal bilaterally. No wheezes/rales/rhonchi. Gastrointestinal: Soft and nontender. No distention. No abdominal bruits. There is no CVA tenderness. Genitourinary deferred Musculoskeletal: Nontender with normal range of motion in all extremities. No joint effusions.  No lower extremity tenderness nor edema. Neurologic:  Normal speech and language. No gross focal neurologic deficits are appreciated. Speech is normal. No gait instability. Skin:  Skin is warm, dry and intact. No rash noted. Psychiatric: Mood and affect are normal. Speech and behavior are normal. Patient exhibits appropriate insight and judgment.  ____________________________________________   EKG  EKG noted below  ____________________________________________    RADIOLOGY  Per radiology and my review KUB shows stool in the rectum  ____________________________________________   PROCEDURES  Procedure(s) performed: None  Critical Care performed: No  ____________________________________________   INITIAL IMPRESSION / ASSESSMENT AND PLAN / ED COURSE  Pertinent labs & imaging results that were available during my care of the patient were reviewed by me and considered in my medical decision making (see chart for details).  On rectal exam patient is a large soft mass of stool in the rectum she complains of a lot of pain when this is done and stool is Hemoccult negative Urojet with lidocaine jelly was inserted into the rectum and the Urojet was given to the patient after this patient receives one enema puts out 1 the ball sized lump of stool another enema was subsequently given to the patient patient had a  very large result reports she feels much much better she had felt some nausea but that improved drastically KG was done because of the nausea showed normal sinus rhythm at 79 and normal axis and no acute changes were seen no old EKGs were found to be available in the record due to the new Epic computer system  ____________________________________________   FINAL CLINICAL IMPRESSION(S) / ED DIAGNOSES  Final diagnoses:  Constipation, unspecified constipation type    Nena Polio, MD 09/24/14 1625

## 2014-09-24 NOTE — Discharge Instructions (Signed)

## 2014-09-24 NOTE — ED Notes (Signed)
Enema given at 1450, order completed in error at this time.

## 2014-09-24 NOTE — ED Notes (Signed)
Pt to ED via EMS with rectal pain and constipation for the past few days. Pt was released from this hospital yesterday after cardiac bypass surgery on Tuesday. Pt states unable to have bowel movement, feels the urge but cannot release anything. Vitals wnl, no acute distress noted.

## 2014-09-24 NOTE — ED Notes (Signed)
Pt experienced large bowel movement, states "feeling much better, but still sick on stomach." MD Malinda notified, no further orders given at this time. Pt cleaned up, sheets changed, and readjusted in bed. Pt resting comfortably at this time, vitals wnl, no acute distress noted.

## 2014-09-25 ENCOUNTER — Observation Stay
Admission: EM | Admit: 2014-09-25 | Discharge: 2014-09-27 | Disposition: A | Discharge status: Home / Self Care | Payer: Medicare Other | Attending: Internal Medicine | Admitting: Internal Medicine

## 2014-09-25 ENCOUNTER — Encounter: Payer: Self-pay | Admitting: Emergency Medicine

## 2014-09-25 DIAGNOSIS — G4730 Sleep apnea, unspecified: Secondary | ICD-10-CM | POA: Diagnosis not present

## 2014-09-25 DIAGNOSIS — D649 Anemia, unspecified: Secondary | ICD-10-CM | POA: Insufficient documentation

## 2014-09-25 DIAGNOSIS — K219 Gastro-esophageal reflux disease without esophagitis: Secondary | ICD-10-CM | POA: Diagnosis not present

## 2014-09-25 DIAGNOSIS — R51 Headache: Secondary | ICD-10-CM | POA: Insufficient documentation

## 2014-09-25 DIAGNOSIS — Z955 Presence of coronary angioplasty implant and graft: Secondary | ICD-10-CM | POA: Insufficient documentation

## 2014-09-25 DIAGNOSIS — Y839 Surgical procedure, unspecified as the cause of abnormal reaction of the patient, or of later complication, without mention of misadventure at the time of the procedure: Secondary | ICD-10-CM | POA: Insufficient documentation

## 2014-09-25 DIAGNOSIS — Z79899 Other long term (current) drug therapy: Secondary | ICD-10-CM | POA: Diagnosis not present

## 2014-09-25 DIAGNOSIS — I25119 Atherosclerotic heart disease of native coronary artery with unspecified angina pectoris: Secondary | ICD-10-CM | POA: Insufficient documentation

## 2014-09-25 DIAGNOSIS — I10 Essential (primary) hypertension: Secondary | ICD-10-CM | POA: Diagnosis not present

## 2014-09-25 DIAGNOSIS — E785 Hyperlipidemia, unspecified: Secondary | ICD-10-CM | POA: Diagnosis not present

## 2014-09-25 DIAGNOSIS — Z888 Allergy status to other drugs, medicaments and biological substances status: Secondary | ICD-10-CM | POA: Insufficient documentation

## 2014-09-25 DIAGNOSIS — Z7982 Long term (current) use of aspirin: Secondary | ICD-10-CM | POA: Diagnosis not present

## 2014-09-25 DIAGNOSIS — F419 Anxiety disorder, unspecified: Secondary | ICD-10-CM | POA: Insufficient documentation

## 2014-09-25 DIAGNOSIS — F329 Major depressive disorder, single episode, unspecified: Secondary | ICD-10-CM | POA: Insufficient documentation

## 2014-09-25 DIAGNOSIS — K9189 Other postprocedural complications and disorders of digestive system: Principal | ICD-10-CM | POA: Insufficient documentation

## 2014-09-25 DIAGNOSIS — K567 Ileus, unspecified: Secondary | ICD-10-CM | POA: Diagnosis present

## 2014-09-25 DIAGNOSIS — R1110 Vomiting, unspecified: Secondary | ICD-10-CM

## 2014-09-25 DIAGNOSIS — I499 Cardiac arrhythmia, unspecified: Secondary | ICD-10-CM | POA: Insufficient documentation

## 2014-09-25 DIAGNOSIS — Z951 Presence of aortocoronary bypass graft: Secondary | ICD-10-CM | POA: Diagnosis not present

## 2014-09-25 DIAGNOSIS — R112 Nausea with vomiting, unspecified: Secondary | ICD-10-CM | POA: Diagnosis present

## 2014-09-25 DIAGNOSIS — I252 Old myocardial infarction: Secondary | ICD-10-CM | POA: Insufficient documentation

## 2014-09-25 HISTORY — DX: Major depressive disorder, single episode, unspecified: F32.9

## 2014-09-25 HISTORY — DX: Cardiac arrhythmia, unspecified: I49.9

## 2014-09-25 HISTORY — DX: Depression, unspecified: F32.A

## 2014-09-25 LAB — COMPREHENSIVE METABOLIC PANEL
ALBUMIN: 3.5 g/dL (ref 3.5–5.0)
ALT: 15 U/L (ref 14–54)
AST: 23 U/L (ref 15–41)
Alkaline Phosphatase: 56 U/L (ref 38–126)
Anion gap: 10 (ref 5–15)
BUN: 16 mg/dL (ref 6–20)
CHLORIDE: 107 mmol/L (ref 101–111)
CO2: 25 mmol/L (ref 22–32)
Calcium: 8.3 mg/dL — ABNORMAL LOW (ref 8.9–10.3)
Creatinine, Ser: 0.57 mg/dL (ref 0.44–1.00)
GFR calc Af Amer: >60 mL/min (ref >60)
GFR calc non Af Amer: >60 mL/min (ref >60)
Glucose, Bld: 116 mg/dL — ABNORMAL HIGH (ref 65–99)
Potassium: 3.3 mmol/L — ABNORMAL LOW (ref 3.5–5.1)
Sodium: 142 mmol/L (ref 135–145)
TOTAL PROTEIN: 7 g/dL (ref 6.5–8.1)
Total Bilirubin: 0.9 mg/dL (ref 0.3–1.2)

## 2014-09-25 LAB — CBC
HCT: 32.8 % — ABNORMAL LOW (ref 35.0–47.0)
Hemoglobin: 10.3 g/dL — ABNORMAL LOW (ref 12.0–16.0)
MCH: 25.1 pg — ABNORMAL LOW (ref 26.0–34.0)
MCHC: 31.6 g/dL — ABNORMAL LOW (ref 32.0–36.0)
MCV: 79.5 fL — ABNORMAL LOW (ref 80.0–100.0)
Platelets: 310 10*3/uL (ref 150–440)
RBC: 4.12 MIL/uL (ref 3.80–5.20)
RDW: 34 % — ABNORMAL HIGH (ref 11.5–14.5)
WBC: 8.6 10*3/uL (ref 3.6–11.0)

## 2014-09-25 LAB — CREATININE, SERUM
Creatinine, Ser: 0.61 mg/dL (ref 0.44–1.00)
GFR calc Af Amer: >60 mL/min (ref >60)
GFR calc non Af Amer: >60 mL/min (ref >60)

## 2014-09-25 LAB — CBC WITH DIFFERENTIAL/PLATELET
BASOS ABS: 0 10*3/uL (ref 0–0.1)
Eosinophils Absolute: 0 10*3/uL (ref 0–0.7)
Eosinophils Relative: 0 %
HCT: 36.3 % (ref 35.0–47.0)
HEMOGLOBIN: 11.5 g/dL — AB (ref 12.0–16.0)
Lymphocytes Relative: 8 %
Lymphs Abs: 0.7 10*3/uL — ABNORMAL LOW (ref 1.0–3.6)
MCH: 25 pg — AB (ref 26.0–34.0)
MCHC: 31.7 g/dL — ABNORMAL LOW (ref 32.0–36.0)
MCV: 79 fL — ABNORMAL LOW (ref 80.0–100.0)
MONO ABS: 0.5 10*3/uL (ref 0.2–0.9)
Monocytes Relative: 5 %
Neutro Abs: 8 10*3/uL — ABNORMAL HIGH (ref 1.4–6.5)
Neutrophils Relative %: 87 %
Platelets: 301 10*3/uL (ref 150–440)
RBC: 4.59 MIL/uL (ref 3.80–5.20)
RDW: 33.9 % — ABNORMAL HIGH (ref 11.5–14.5)
WBC: 9.3 10*3/uL (ref 3.6–11.0)

## 2014-09-25 MED ORDER — ONDANSETRON 4 MG PO TBDP
4.0000 mg | ORAL_TABLET | Freq: Once | ORAL | Status: AC
  Administered 2014-09-25: 4 mg via ORAL

## 2014-09-25 MED ORDER — ONDANSETRON HCL 4 MG/2ML IJ SOLN: 4.0000 mg | Freq: Four times a day (QID) | INTRAMUSCULAR | Status: DC | PRN

## 2014-09-25 MED ORDER — FOLIC ACID 1 MG PO TABS
1.0000 mg | ORAL_TABLET | Freq: Every day | ORAL | Status: DC
  Administered 2014-09-26 – 2014-09-27 (×2): 1 mg via ORAL
  Filled 2014-09-25 (×2): qty 1

## 2014-09-25 MED ORDER — FERROUS SULFATE 325 (65 FE) MG PO TABS
325.0000 mg | ORAL_TABLET | Freq: Every day | ORAL | Status: DC
  Administered 2014-09-26 – 2014-09-27 (×2): 325 mg via ORAL
  Filled 2014-09-25 (×2): qty 1

## 2014-09-25 MED ORDER — POTASSIUM CHLORIDE IN NACL 20-0.9 MEQ/L-% IV SOLN
INTRAVENOUS | Status: DC
  Administered 2014-09-25: via INTRAVENOUS
  Filled 2014-09-25 (×5): qty 1000

## 2014-09-25 MED ORDER — SODIUM CHLORIDE 0.9 % IV SOLN
Freq: Once | INTRAVENOUS | Status: AC
  Administered 2014-09-25: 15:00:00 via INTRAVENOUS

## 2014-09-25 MED ORDER — PROMETHAZINE HCL 25 MG/ML IJ SOLN
12.5000 mg | Freq: Once | INTRAMUSCULAR | Status: AC
  Administered 2014-09-25: 12.5 mg via INTRAVENOUS

## 2014-09-25 MED ORDER — MINERAL OIL RE ENEM
1.0000 | ENEMA | Freq: Once | RECTAL | Status: AC
  Administered 2014-09-25: 1 via RECTAL

## 2014-09-25 MED ORDER — PROMETHAZINE HCL 25 MG/ML IJ SOLN
INTRAMUSCULAR | Status: AC
  Administered 2014-09-25: 12.5 mg via INTRAVENOUS
  Filled 2014-09-25: qty 1

## 2014-09-25 MED ORDER — METOCLOPRAMIDE HCL 5 MG/ML IJ SOLN
INTRAMUSCULAR | Status: AC
  Administered 2014-09-25: 10 mg via INTRAVENOUS
  Filled 2014-09-25: qty 2

## 2014-09-25 MED ORDER — ONDANSETRON HCL 4 MG/2ML IJ SOLN
4.0000 mg | Freq: Once | INTRAMUSCULAR | Status: AC
  Administered 2014-09-25: 4 mg via INTRAVENOUS

## 2014-09-25 MED ORDER — METOCLOPRAMIDE HCL 5 MG/ML IJ SOLN
10.0000 mg | Freq: Once | INTRAMUSCULAR | Status: AC
  Administered 2014-09-25: 10 mg via INTRAVENOUS

## 2014-09-25 MED ORDER — METOPROLOL SUCCINATE ER 50 MG PO TB24
50.0000 mg | ORAL_TABLET | Freq: Every day | ORAL | Status: DC
  Administered 2014-09-26 – 2014-09-27 (×2): 50 mg via ORAL
  Filled 2014-09-25 (×2): qty 1

## 2014-09-25 MED ORDER — ONDANSETRON HCL 4 MG PO TABS: 4.0000 mg | ORAL_TABLET | Freq: Four times a day (QID) | ORAL | Status: DC | PRN

## 2014-09-25 MED ORDER — ASPIRIN EC 325 MG PO TBEC
325.0000 mg | DELAYED_RELEASE_TABLET | Freq: Every day | ORAL | Status: DC
  Administered 2014-09-25 – 2014-09-27 (×3): 325 mg via ORAL
  Filled 2014-09-25 (×3): qty 1

## 2014-09-25 MED ORDER — PANTOPRAZOLE SODIUM 40 MG PO TBEC
40.0000 mg | DELAYED_RELEASE_TABLET | Freq: Two times a day (BID) | ORAL | Status: DC
  Administered 2014-09-25 – 2014-09-27 (×4): 40 mg via ORAL
  Filled 2014-09-25 (×4): qty 1

## 2014-09-25 MED ORDER — MORPHINE SULFATE 2 MG/ML IJ SOLN: 1.0000 mg | INTRAMUSCULAR | Status: DC | PRN

## 2014-09-25 MED ORDER — TRAMADOL HCL 50 MG PO TABS: 50.0000 mg | ORAL_TABLET | Freq: Four times a day (QID) | ORAL | Status: DC | PRN

## 2014-09-25 MED ORDER — FUROSEMIDE 40 MG PO TABS
40.0000 mg | ORAL_TABLET | Freq: Every day | ORAL | Status: DC
  Administered 2014-09-26 – 2014-09-27 (×2): 40 mg via ORAL
  Filled 2014-09-25 (×2): qty 1

## 2014-09-25 MED ORDER — HEPARIN SODIUM (PORCINE) 5000 UNIT/ML IJ SOLN
5000.0000 [IU] | Freq: Three times a day (TID) | INTRAMUSCULAR | Status: DC
  Administered 2014-09-25 – 2014-09-27 (×5): 5000 [IU] via SUBCUTANEOUS
  Filled 2014-09-25 (×5): qty 1

## 2014-09-25 MED ORDER — ROSUVASTATIN CALCIUM 20 MG PO TABS
40.0000 mg | ORAL_TABLET | Freq: Every day | ORAL | Status: DC
  Administered 2014-09-26 – 2014-09-27 (×2): 40 mg via ORAL
  Filled 2014-09-25 (×2): qty 2

## 2014-09-25 MED ORDER — FLUOXETINE HCL 20 MG PO CAPS
40.0000 mg | ORAL_CAPSULE | Freq: Every day | ORAL | Status: DC
  Administered 2014-09-26 – 2014-09-27 (×2): 40 mg via ORAL
  Filled 2014-09-25 (×2): qty 2

## 2014-09-25 MED ORDER — ONDANSETRON HCL 4 MG/2ML IJ SOLN
INTRAMUSCULAR | Status: AC
  Administered 2014-09-25: 4 mg via INTRAVENOUS
  Filled 2014-09-25: qty 2

## 2014-09-25 MED ORDER — SENNOSIDES-DOCUSATE SODIUM 8.6-50 MG PO TABS: 1.0000 | ORAL_TABLET | Freq: Every evening | ORAL | Status: DC | PRN

## 2014-09-25 NOTE — ED Notes (Signed)
Admitting physician at the bedside for evaluation

## 2014-09-25 NOTE — ED Notes (Signed)
Patient to Ed with c.o abdominal pain and chest pain. Patient recently had Bypass surgery. Patient was seen in the ER last night for constipation and was given an enema. Patient states she still has had no appetite, experiencing abdominal pain and has been nauseated. Patient also states she has not had any BM since the enema.

## 2014-09-25 NOTE — ED Notes (Signed)
Pt urinated, not able to catch in the bedpad. Admitting physician back to bedside for evaluation

## 2014-09-25 NOTE — ED Notes (Signed)
Patient had bypass surgery one week ago, was here last pm with c/o constipation, was given enema, states still unable to eat, abdominal pain, has take stool softeners, no laxatives

## 2014-09-25 NOTE — ED Notes (Signed)
Pt vomited clear yellow emesis and had small bowel movement. Pt ivf running very slow, flushed with with turbulent flush. Restarted iv for hydration

## 2014-09-25 NOTE — ED Provider Notes (Addendum)
[facility] Emergency Department Provider Note   Time seen: 2 PM  I have reviewed the triage vital signs and nursing notes.   HISTORY  Chief Complaint Abdominal Pain and Constipation    HPI Debbie Delacruz is a 69 y.o. female who presents here 1 week after bypass surgery. Patient states she has not been able to have a bowel movement since discharge from hospital. She was seen last night and was disimpacted and believe received an enema as well. She complains of severe nausea Or poor by mouth intake. She is not having abdominal tenderness or pain but nausea is severe denies any chest pain.    Past Medical History  Diagnosis Date  . Hypertension   . Anxiety   . Hyperlipidemia   . GERD (gastroesophageal reflux disease)   . Anemia   . Coronary artery disease     2x stents, Dr. Clayborn Bigness  . Family history of adverse reaction to anesthesia     "brother; S/P lipotripsy in [place]; transferred to [facility]; had to put him on life support for 2 days"  . PONV (postoperative nausea and vomiting)   . Anginal pain   . Myocardial infarct, old 1999    15 years ago  . Sleep apnea     "don't use a mask" (09/13/2014)  . History of blood transfusion 05/2014    "we haven't figured out why I needed it yet" (09/13/2014)  . History of hiatal hernia   . Headache     "weekly" (09/13/2014)  . S/P CABG x 4 09/18/2014    LIMA to LAD, SVG to PDA-dRCA sequentially, SVG to OM, EVH via right thigh     Patient Active Problem List   Diagnosis Date Noted  . S/P CABG x 4 09/18/2014  . Barrett's esophagus 09/17/2014  . Duodenitis 09/17/2014  . History of gastritis   . Unstable angina 09/13/2014  . Syncope 09/13/2014  . GERD (gastroesophageal reflux disease)   . Medicare annual wellness visit, initial 03/28/2013  . Essential hypertension, benign 11/16/2012  . Anemia 11/16/2012  . Elevated blood sugar 11/16/2012  . Arthralgia 11/16/2012  . Screening for breast  cancer 11/16/2012  . Special screening for malignant neoplasms, colon 11/16/2012  . Coronary artery disease 11/16/2012  . Dyslipidemia 09/15/2008  . COLONIC POLYPS, ADENOMATOUS, HX OF 02/20/2008  . ANXIETY 11/04/2006    Past Surgical History  Procedure Laterality Date  . Incontinence surgery    . Cataract extraction w/ intraocular lens  implant, bilateral    . Carpal tunnel release Left   . Back surgery    . Laparoscopic cholecystectomy    . Inguinal hernia repair Right X 3    "last one was in the 1990's; still have hernia there now" (09/13/2014)  . Coronary angioplasty with stent placement  1999    [facility] x2 stent  . Cardiac catheterization  06/2014  . Appendectomy    . Vaginal hysterectomy    . Lumbar disc surgery  1990's?  . Esophagogastroduodenoscopy N/A 09/17/2014    Procedure: ESOPHAGOGASTRODUODENOSCOPY (EGD);  Surgeon: Inda Castle, MD;  Location: [place];  Service: Endoscopy;  Laterality: N/A;  . Coronary artery bypass graft N/A 09/18/2014    Procedure: CORONARY ARTERY BYPASS GRAFTING (CABG)times four using LIMA  to LAD:SVG to  PD and DIST RCA;SVG to OM;, EVH Right thigh;  Surgeon: [name], MD;  Location: [place];  Service: Open Heart Surgery;  Laterality: N/A;  . Tee without cardioversion N/A  09/18/2014    Procedure: TRANSESOPHAGEAL ECHOCARDIOGRAM (TEE);  Surgeon: [name], MD;  Location: [place];  Service: Open Heart Surgery;  Laterality: N/A;    Current Outpatient Rx  Name  Route  Sig  Dispense  Refill  . acetaminophen (TYLENOL) 500 MG tablet   Oral   Take 500 mg by mouth every 6 (six) hours as needed for headache.         Marland Kitchen aspirin EC 325 MG EC tablet   Oral   Take 1 tablet (325 mg total) by mouth daily.   30 tablet   0   . ferrous sulfate 325 (65 FE) MG tablet   Oral   Take 325 mg by mouth daily with breakfast.          . FLUoxetine (PROZAC) 20 MG capsule   Oral   Take 40 mg by mouth daily.         . folic acid (FOLVITE) 1 MG tablet    Oral   Take 1 tablet (1 mg total) by mouth daily.   30 tablet   0   . furosemide (LASIX) 40 MG tablet   Oral   Take 1 tablet (40 mg total) by mouth daily. For 7 Days   7 tablet   0   . metoprolol succinate (TOPROL-XL) 50 MG 24 hr tablet      TAKE ONE TABLET BY MOUTH ONCE DAILY, WITH OR IMMEDIATELY FOLLOWING A MEAL.   90 tablet   1   . omeprazole (PRILOSEC OTC) 20 MG tablet   Oral   Take 20 mg by mouth daily.         Marland Kitchen oxyCODONE (OXY IR/ROXICODONE) 5 MG immediate release tablet   Oral   Take 1-2 tablets (5-10 mg total) by mouth every 3 (three) hours as needed for severe pain.   30 tablet   0   . pantoprazole (PROTONIX) 40 MG tablet   Oral   Take 1 tablet (40 mg total) by mouth 2 (two) times daily.   60 tablet   0   . rosuvastatin (CRESTOR) 20 MG tablet   Oral   Take 40 mg by mouth daily.          . traMADol (ULTRAM) 50 MG tablet      TAKE 1 OR 2 TABLETS BY MOUTH EVERY 8 HOURS AS NEEDED. Patient taking differently: TAKE 1 OR 2 TABLETS BY MOUTH EVERY 8 HOURS AS NEEDED FOR PAIN   180 tablet   1     Allergies Lipitor  Family History  Problem Relation Age of Onset  . Heart disease Mother   . Stroke Mother   . Heart attack Mother   . Hypertension Mother   . COPD Father   . Heart disease Father   . Heart attack Father   . Glaucoma Father   . Osteoporosis Father   . Emphysema Father   . Cancer Sister 1    colon  . Hypertension Sister   . Stroke Brother   . Colon cancer Brother   . Hypertension Brother   . Esophageal cancer Neg Hx   . Stomach cancer Neg Hx   . Rectal cancer Neg Hx   . Stroke Maternal Grandmother   . Osteoporosis Maternal Grandmother   . Hypertension Maternal Grandmother   . Hypertension Paternal Grandmother   . Hypertension Paternal Grandfather     Social History History  Substance Use Topics  . Smoking status: Never Smoker   . Smokeless  tobacco: Never Used  . Alcohol Use: Yes     Comment: 09/13/2014 "might have a drink a  couple times/yr"    Review of Systems Constitutional: Negative for fever. Eyes: Negative for visual changes. ENT: Negative for sore throat. Cardiovascular: Negative for chest pain. Respiratory: Negative for shortness of breath. Gastrointestinal: Negative for abdominal pain, strong positive for nausea Genitourinary: Negative for dysuria. Musculoskeletal: Negative for back pain. Skin: Negative for rash. Neurological: Negative for headaches, positive for weakness  10-point ROS otherwise negative.  ____________________________________________   PHYSICAL EXAM:  VITAL SIGNS: ED Triage Vitals  Enc Vitals Group     BP 09/25/14 1256 144/88 mmHg     Pulse --      Resp 09/25/14 1256 18     Temp --      Temp Source 09/25/14 1256 Oral     SpO2 09/25/14 1256 97 %     Weight --      Height --      Head Cir --      Peak Flow --      Pain Score 09/25/14 1257 8     Pain Loc --      Pain Edu? --      Excl. in [place]? --     Constitutional: Alert and oriented. Mild distress Eyes: Conjunctivae are normal. PERRL. Normal extraocular movements. ENT   Head: Normocephalic and atraumatic.   Nose: No congestion/rhinnorhea.   Mouth/Throat: Mucous membranes are moist.   Neck: No stridor. Hematological/Lymphatic/Immunilogical: No cervical lymphadenopathy. Cardiovascular: Normal rate, regular rhythm. Normal and symmetric distal pulses are present in all extremities. No murmurs, rubs, or gallops. Respiratory: Normal respiratory effort without tachypnea nor retractions. Breath sounds are clear and equal bilaterally. No wheezes/rales/rhonchi. Gastrointestinal: Soft and nontender. No distention. No abdominal bruits. There is no CVA tenderness. Rectal: No significant stool rectally Musculoskeletal: Nontender with normal range of motion in all extremities. No joint effusions.  No lower extremity tenderness nor edema. Neurologic:  Normal speech and language. No gross focal neurologic deficits  are appreciated. Speech is normal. No gait instability. Skin:  Skin is warm, dry and intact. No rash noted.   ____________________________________________    LABS (pertinent positives/negatives)  Grossly unremarkable  ____________________________________________  EKG: Normal sinus rhythm with normal axis and normal intervals no evidence of hypertrophy. Possible septal infarct. Rate is 84 bpm    RADIOLOGY  None  ____________________________________________    IMPRESSION / ASSESSMENT AND PLAN / ED COURSE  Pertinent labs & imaging results that were available during my care of the patient were reviewed by me and considered in my medical decision making (see chart for details).  Constipation and fecal impaction  We'll give IV fluid rehydration Zofran and disimpacted patient -------------------------------------------------------------------------- Enema given with positive results. The patient does not feel much better ----------------------------------------------------------------------------  Final impression  Intractable nausea and vomiting  Patient received saline hydration as well as 3 different doses of antiemetics. She is still vomiting. Abdomen remained soft she will require inpatient admission and IV hydration as well as continued IV antiemetics.   Earleen Newport, MD   Earleen Newport, MD 09/25/14 1713  Earleen Newport, MD 09/25/14 2154

## 2014-09-25 NOTE — ED Notes (Signed)
Pt still vomiting. MD notified.  MD reports going to admit patient.  No new orders at this time.

## 2014-09-25 NOTE — H&P (Signed)
Debbie Delacruz is an 69 y.o. female.   Chief Complaint: nausea and vomiting.  HPI: the patient presents to the emergency department complaining of nausea for 3 days. The patient has had nonbloody emesis for 1 day. Unfortunately, all of her vomit has been bilious in color. The patient recently underwent coronary artery bypass graft surgery. She has not been able to eat more than a few bites of pastry and perhaps 6 ounces of water due to her upset stomach for the last few days. She states that her chest only hurts when she is vomiting. She denies any shortness of breath or fevers. In the emergency department the patient was given antibiotics but continued to have bilious vomiting. She also states that she has not been able to have a bowel movement in nearly a week. She was seen in emergency department a few days ago and given an enema which only yielded a few small pieces of hard stool. Due to intractable vomiting emergency department staff called for admission.   Past Medical History  Diagnosis Date  . Hypertension   . Anxiety   . Hyperlipidemia   . GERD (gastroesophageal reflux disease)   . Anemia   . Coronary artery disease     2x stents, Dr. Clayborn Bigness  . Family history of adverse reaction to anesthesia     "brother; S/P lipotripsy in [place]; transferred to [facility]; had to put him on life support for 2 days"  . PONV (postoperative nausea and vomiting)   . Anginal pain   . Myocardial infarct, old 1999    15 years ago  . Sleep apnea     "don't use a mask" (09/13/2014)  . History of blood transfusion 05/2014    "we haven't figured out why I needed it yet" (09/13/2014)  . History of hiatal hernia   . Headache     "weekly" (09/13/2014)  . S/P CABG x 4 09/18/2014    LIMA to LAD, SVG to PDA-dRCA sequentially, SVG to OM, EVH via right thigh   . Dysrhythmia   . Depression     Past Surgical History  Procedure Laterality Date  . Incontinence surgery    . Cataract extraction w/  intraocular lens  implant, bilateral    . Carpal tunnel release Left   . Back surgery    . Laparoscopic cholecystectomy    . Inguinal hernia repair Right X 3    "last one was in the 1990's; still have hernia there now" (09/13/2014)  . Coronary angioplasty with stent placement  1999    [facility] x2 stent  . Cardiac catheterization  06/2014  . Appendectomy    . Vaginal hysterectomy    . Lumbar disc surgery  1990's?  . Esophagogastroduodenoscopy N/A 09/17/2014    Procedure: ESOPHAGOGASTRODUODENOSCOPY (EGD);  Surgeon: Inda Castle, MD;  Location: [place];  Service: Endoscopy;  Laterality: N/A;  . Coronary artery bypass graft N/A 09/18/2014    Procedure: CORONARY ARTERY BYPASS GRAFTING (CABG)times four using LIMA  to LAD:SVG to  PD and DIST RCA;SVG to OM;, EVH Right thigh;  Surgeon: [name], MD;  Location: [place];  Service: Open Heart Surgery;  Laterality: N/A;  . Tee without cardioversion N/A 09/18/2014    Procedure: TRANSESOPHAGEAL ECHOCARDIOGRAM (TEE);  Surgeon: [name], MD;  Location: [place];  Service: Open Heart Surgery;  Laterality: N/A;    Family History  Problem Relation Age of Onset  . Heart disease Mother   . Stroke Mother   .  Heart attack Mother   . Hypertension Mother   . COPD Father   . Heart disease Father   . Heart attack Father   . Glaucoma Father   . Osteoporosis Father   . Emphysema Father   . Cancer Sister 70    colon  . Hypertension Sister   . Stroke Brother   . Colon cancer Brother   . Hypertension Brother   . Esophageal cancer Neg Hx   . Stomach cancer Neg Hx   . Rectal cancer Neg Hx   . Stroke Maternal Grandmother   . Osteoporosis Maternal Grandmother   . Hypertension Maternal Grandmother   . Hypertension Paternal Grandmother   . Hypertension Paternal Grandfather    Social History:  reports that she has never smoked. She has never used smokeless tobacco. She reports that she drinks alcohol. She reports that she does not use illicit  drugs.  Allergies:  Allergies  Allergen Reactions  . Lipitor [Atorvastatin]     myalgia    Medications Prior to Admission  Medication Sig Dispense Refill  . acetaminophen (TYLENOL) 500 MG tablet Take 500 mg by mouth every 6 (six) hours as needed for headache.    Marland Kitchen aspirin EC 325 MG EC tablet Take 1 tablet (325 mg total) by mouth daily. 30 tablet 0  . ferrous sulfate 325 (65 FE) MG tablet Take 325 mg by mouth daily with breakfast.     . FLUoxetine (PROZAC) 20 MG capsule Take 40 mg by mouth daily.    . folic acid (FOLVITE) 1 MG tablet Take 1 tablet (1 mg total) by mouth daily. 30 tablet 0  . furosemide (LASIX) 40 MG tablet Take 1 tablet (40 mg total) by mouth daily. For 7 Days 7 tablet 0  . metoprolol succinate (TOPROL-XL) 50 MG 24 hr tablet TAKE ONE TABLET BY MOUTH ONCE DAILY, WITH OR IMMEDIATELY FOLLOWING A MEAL. 90 tablet 1  . omeprazole (PRILOSEC OTC) 20 MG tablet Take 20 mg by mouth daily.    Marland Kitchen oxyCODONE (OXY IR/ROXICODONE) 5 MG immediate release tablet Take 1-2 tablets (5-10 mg total) by mouth every 3 (three) hours as needed for severe pain. 30 tablet 0  . pantoprazole (PROTONIX) 40 MG tablet Take 1 tablet (40 mg total) by mouth 2 (two) times daily. 60 tablet 0  . rosuvastatin (CRESTOR) 20 MG tablet Take 40 mg by mouth daily.     . traMADol (ULTRAM) 50 MG tablet TAKE 1 OR 2 TABLETS BY MOUTH EVERY 8 HOURS AS NEEDED. (Patient taking differently: TAKE 1 OR 2 TABLETS BY MOUTH EVERY 8 HOURS AS NEEDED FOR PAIN) 180 tablet 1    Results for orders placed or performed during the hospital encounter of 09/25/14 (from the past 48 hour(s))  Comprehensive metabolic panel     Status: Abnormal   Collection Time: 09/25/14  2:09 PM  Result Value Ref Range   Sodium 142 135 - 145 mmol/L   Potassium 3.3 (L) 3.5 - 5.1 mmol/L   Chloride 107 101 - 111 mmol/L   CO2 25 22 - 32 mmol/L   Glucose, Bld 116 (H) 65 - 99 mg/dL   BUN 16 6 - 20 mg/dL   Creatinine, Ser 0.57 0.44 - 1.00 mg/dL   Calcium 8.3 (L)  8.9 - 10.3 mg/dL   Total Protein 7.0 6.5 - 8.1 g/dL   Albumin 3.5 3.5 - 5.0 g/dL   AST 23 15 - 41 U/L   ALT 15 14 - 54 U/L   Alkaline  Phosphatase 56 38 - 126 U/L   Total Bilirubin 0.9 0.3 - 1.2 mg/dL   GFR calc non Af Amer >60 >60 mL/min   GFR calc Af Amer >60 >60 mL/min    Comment: (NOTE) The eGFR has been calculated using the CKD EPI equation. This calculation has not been validated in all clinical situations. eGFR's persistently <90 mL/min signify possible Chronic Kidney Disease.    Anion gap 10 5 - 15  CBC with Differential/Platelet     Status: Abnormal   Collection Time: 09/25/14  2:09 PM  Result Value Ref Range   WBC 9.3 3.6 - 11.0 K/uL   RBC 4.59 3.80 - 5.20 MIL/uL   Hemoglobin 11.5 (L) 12.0 - 16.0 g/dL   HCT 36.3 35.0 - 47.0 %   MCV 79.0 (L) 80.0 - 100.0 fL   MCH 25.0 (L) 26.0 - 34.0 pg   MCHC 31.7 (L) 32.0 - 36.0 g/dL   RDW 33.9 (H) 11.5 - 14.5 %   Platelets 301 150 - 440 K/uL   Neutrophils Relative % 87% %   Neutro Abs 8.0 (H) 1.4 - 6.5 K/uL   Lymphocytes Relative 8% %   Lymphs Abs 0.7 (L) 1.0 - 3.6 K/uL   Monocytes Relative 5% %   Monocytes Absolute 0.5 0.2 - 0.9 K/uL   Eosinophils Relative 0% %   Eosinophils Absolute 0.0 0 - 0.7 K/uL   Basophils Relative 0% %   Basophils Absolute 0.0 0 - 0.1 K/uL   Dg Abd 1 View  09/24/2014   CLINICAL DATA:  Constipation. Rectal pain. Recent coronary artery bypass surgery.  EXAM: ABDOMEN - 1 VIEW  COMPARISON:  07/26/2014  FINDINGS: Left lower lobe airspace opacity with blunting of the left lateral costophrenic angle.  Mild prominence of formed stool in the rectum. Upper normal amount of stool in the rest of the colon. No dilated small bowel.  Cholecystectomy clips noted.  IMPRESSION: 1. Borderline prominence of rectal stool but the remainder of the colon appears unremarkable. Correlate with bowel sounds and bowel recovery after surgery in assessing for ileus, but overall no significantly dilated bowel is appreciated. 2. Left lower  lobe airspace opacity probably from atelectasis. There is at least a small left pleural effusion.   Electronically Signed   By: Van Clines M.D.   On: 09/24/2014 13:48    Review of Systems  Constitutional: Negative for fever and chills.  HENT: Negative for sore throat.   Eyes: Negative for blurred vision.  Respiratory: Negative for cough and shortness of breath.   Cardiovascular: Negative for chest pain, palpitations and orthopnea.  Gastrointestinal: Positive for nausea, vomiting and constipation. Negative for diarrhea and blood in stool.  Genitourinary: Negative for dysuria and urgency.  Musculoskeletal: Positive for back pain. Negative for myalgias.  Skin: Negative for itching and rash.  Neurological: Positive for weakness and headaches. Negative for dizziness and focal weakness.  Endo/Heme/Allergies: Negative for polydipsia. Does not bruise/bleed easily.  Psychiatric/Behavioral: Negative for depression and suicidal ideas.    Blood pressure 152/76, pulse 85, temperature 98.4 F (36.9 C), temperature source Oral, resp. rate 18, SpO2 94 %. Physical Exam  Vitals reviewed. Constitutional: She is oriented to person, place, and time. She appears well-developed and well-nourished.  HENT:  Head: Normocephalic and atraumatic.  Mouth/Throat: Oropharynx is clear and moist.  Eyes: EOM are normal. Pupils are equal, round, and reactive to light. No scleral icterus.  Neck: Normal range of motion. No tracheal deviation present. No thyromegaly present.  Cardiovascular:  Normal rate, regular rhythm and normal heart sounds.  Exam reveals no gallop and no friction rub.   No murmur heard. Respiratory: Effort normal and breath sounds normal. She exhibits tenderness.  GI: Soft. She exhibits no distension. Bowel sounds are decreased. There is no hepatosplenomegaly. There is tenderness in the right lower quadrant, suprapubic area and left lower quadrant. There is no rebound, no guarding and negative  Murphy's sign.  Musculoskeletal: Normal range of motion. She exhibits no edema or tenderness.  Lymphadenopathy:    She has no cervical adenopathy.  Neurological: She is alert and oriented to person, place, and time. She has normal reflexes. No cranial nerve deficit.  Skin: Skin is warm and dry. No rash noted. No erythema.  Psychiatric: She has a normal mood and affect. Judgment normal.     Assessment/Plan This is a 69 year old female admitted for intractable nausea and vomiting likely due to postoperative ileus. 1. Ileus: The patient has been unable to move her bowels and continues to have bilious emesis. We will manage her nausea with anti-emetics and continue to provide intravenous fluid for hydration. I have placed her on a clear liquid diet for now and we will advance her diet as tolerated. 2. Coronary artery disease: The patient denies chest pain and has had no EKG changes consistent with ischemia. I do not believe that her nausea and vomiting as a symptom of myocarditis or heart failure. We will place her on telemetry. Continue high-dose aspirin and Lasix per home regimen. 3. Hyperlipidemia: Continue statin therapy. 4. Hypertension: Continue metoprolol  5. Depression: Continue fluoxetine  6. DVT prophylaxis: Heparin  7. GI prophylaxis: Pantoprazole per home regimen due to gastroesophageal reflux disease  The patient is a full code. Time spent on admission orders and patient care approximately 45 minutes.   Harrie Foreman 09/25/2014, 8:38 PM

## 2014-09-25 NOTE — ED Notes (Signed)
Patient denies pain and is resting comfortably.  

## 2014-09-26 DIAGNOSIS — K9189 Other postprocedural complications and disorders of digestive system: Secondary | ICD-10-CM | POA: Diagnosis not present

## 2014-09-26 LAB — URINALYSIS COMPLETE WITH MICROSCOPIC (ARMC ONLY)
Bilirubin Urine: NEGATIVE
GLUCOSE, UA: NEGATIVE mg/dL
Hgb urine dipstick: NEGATIVE
KETONES UR: NEGATIVE mg/dL
LEUKOCYTES UA: NEGATIVE
NITRITE: NEGATIVE
Protein, ur: NEGATIVE mg/dL
SPECIFIC GRAVITY, URINE: 1.013 (ref 1.005–1.030)
pH: 7 (ref 5.0–8.0)

## 2014-09-26 LAB — COMPREHENSIVE METABOLIC PANEL
ALT: 13 U/L — ABNORMAL LOW (ref 14–54)
AST: 18 U/L (ref 15–41)
Albumin: 3 g/dL — ABNORMAL LOW (ref 3.5–5.0)
Alkaline Phosphatase: 45 U/L (ref 38–126)
Anion gap: 9 (ref 5–15)
BUN: 15 mg/dL (ref 6–20)
CO2: 26 mmol/L (ref 22–32)
Calcium: 8.3 mg/dL — ABNORMAL LOW (ref 8.9–10.3)
Chloride: 107 mmol/L (ref 101–111)
Creatinine, Ser: 0.67 mg/dL (ref 0.44–1.00)
GFR calc Af Amer: >60 mL/min (ref >60)
GFR calc non Af Amer: >60 mL/min (ref >60)
Glucose, Bld: 95 mg/dL (ref 65–99)
Potassium: 3.4 mmol/L — ABNORMAL LOW (ref 3.5–5.1)
Sodium: 142 mmol/L (ref 135–145)
Total Bilirubin: 1 mg/dL (ref 0.3–1.2)
Total Protein: 5.9 g/dL — ABNORMAL LOW (ref 6.5–8.1)

## 2014-09-26 MED ORDER — POLYETHYLENE GLYCOL 3350 17 G PO PACK
17.0000 g | PACK | Freq: Every day | ORAL | Status: DC | PRN
  Administered 2014-09-26: 17 g via ORAL
  Filled 2014-09-26: qty 1

## 2014-09-26 MED ORDER — POTASSIUM CHLORIDE CRYS ER 20 MEQ PO TBCR
20.0000 meq | EXTENDED_RELEASE_TABLET | Freq: Two times a day (BID) | ORAL | Status: DC
Start: 2014-09-26 — End: 2014-09-27
  Administered 2014-09-26 – 2014-09-27 (×3): 20 meq via ORAL
  Filled 2014-09-26 (×3): qty 1

## 2014-09-26 MED ORDER — LACTULOSE 10 GM/15ML PO SOLN
30.0000 g | Freq: Two times a day (BID) | ORAL | Status: DC
  Administered 2014-09-26: 30 g via ORAL
  Filled 2014-09-26: qty 60

## 2014-09-26 NOTE — Care Management (Signed)
Patient with recent discharge from [facility] 4/27 for CABG.  No services in the home.   None ordered at discharge from [facility].  Admitted under observation for nausea, vomiting.  Sx have improved.  No needs

## 2014-09-26 NOTE — Progress Notes (Signed)
[facility] NAME: Debbie Delacruz    MR#:  [id number]  DATE OF BIRTH:  1945/06/04  SUBJECTIVE:  CHIEF COMPLAINT:   Chief Complaint  Patient presents with  . Abdominal Pain  . Constipation   Today pt feels better. Abdominal pain resolved. No Nausea, vomiting.  Had BM yesterday.  Tolerating liquid diet well.    REVIEW OF SYSTEMS:  CONSTITUTIONAL: No fever, fatigue or weakness.  RESPIRATORY: No cough, shortness of breath, wheezing or hemoptysis.  CARDIOVASCULAR: No chest pain, orthopnea, edema.  GASTROINTESTINAL: No nausea, vomiting, diarrhea or abdominal pain.  GENITOURINARY: No dysuria, hematuria.  ENDOCRINE: No polyuria, nocturia,  HEMATOLOGY: No anemia, easy bruising or bleeding SKIN: No rash or lesion. MUSCULOSKELETAL: No joint pain or arthritis.   NEUROLOGIC: No tingling, numbness, weakness.  PSYCHIATRY: No anxiety or depression.   DRUG ALLERGIES:   Allergies  Allergen Reactions  . Lipitor [Atorvastatin]     myalgia    VITALS:  Blood pressure 147/70, pulse 79, temperature 98.5 F (36.9 C), temperature source Oral, resp. rate 18, height 5\' 2"  (1.575 m), weight 78.835 kg (173 lb 12.8 oz), SpO2 96 %.  PHYSICAL EXAMINATION:  GENERAL:  69 y.o.-year-old patient lying in the bed with no acute distress.  EYES: Pupils equal, round, reactive to light and accommodation. No scleral icterus. Extraocular muscles intact.  HEENT: Head atraumatic, normocephalic. Oropharynx and nasopharynx clear.  NECK:  Supple, no jugular venous distention. No thyroid enlargement, no tenderness.  LUNGS: Normal breath sounds bilaterally, no wheezing, rales,rhonchi or crepitation. No use of accessory muscles of respiration.  CARDIOVASCULAR: S1, S2 normal. No murmurs, rubs, or gallops. + scar from recent CABG  ABDOMEN: Soft, nontender, nondistended. + bowel sounds. No organomegaly or mass.  EXTREMITIES: No cyanosis, clubbing or peripheral  edema.  NEUROLOGIC: Cranial nerves II through XII are intact. Muscle strength 5/5 in all extremities. No focal sensory defecits PSYCHIATRIC: The patient is alert and oriented x 3.  SKIN: No obvious rash, lesion, or ulcer.    LABORATORY PANEL:   CBC  Recent Labs Lab 09/25/14 2008  WBC 8.6  HGB 10.3*  HCT 32.8*  PLT 310   ------------------------------------------------------------------------------------------------------------------  Chemistries   Recent Labs Lab 09/19/14 1625  09/26/14 0419  NA  --   < > 142  K  --   < > 3.4*  CL  --   < > 107  CO2  --   < > 26  GLUCOSE  --   < > 95  BUN  --   < > 15  CREATININE 0.81  < > 0.67  CALCIUM  --   < > 8.3*  MG 2.6*  --   --   AST  --   < > 18  ALT  --   < > 13*  ALKPHOS  --   < > 45  BILITOT  --   < > 1.0  < > = values in this interval not displayed. ------------------------------------------------------------------------------------------------------------------  Cardiac Enzymes No results for input(s): TROPONINI in the last 168 hours. ------------------------------------------------------------------------------------------------------------------  RADIOLOGY:  Dg Abd 1 View  09/24/2014   CLINICAL DATA:  Constipation. Rectal pain. Recent coronary artery bypass surgery.  EXAM: ABDOMEN - 1 VIEW  COMPARISON:  07/26/2014  FINDINGS: Left lower lobe airspace opacity with blunting of the left lateral costophrenic angle.  Mild prominence of formed stool in the rectum. Upper normal amount of stool in the rest of the colon. No dilated small  bowel.  Cholecystectomy clips noted.  IMPRESSION: 1. Borderline prominence of rectal stool but the remainder of the colon appears unremarkable. Correlate with bowel sounds and bowel recovery after surgery in assessing for ileus, but overall no significantly dilated bowel is appreciated. 2. Left lower lobe airspace opacity probably from atelectasis. There is at least a small left pleural effusion.    Electronically Signed   By: Van Clines M.D.   On: 09/24/2014 13:48    EKG:     .   Marland Kitchen     ASSESSMENT AND PLAN:   1. Ileus - likely cause of pt's nausea, vomiting, abdominal pain.  - had enema in the ER and had BM yesterday.  Clinically improved.  - no N/V and abdominal pain resolved.  - will start on Lactulose, Miralax PRN - will advanced diet and d/c fluids and monitor.    2. Hx of CAD s/p CABG - no chest pain.  No evidence of CHF - cont. ASA, B-blocker, Statin.  - d/c tele.   3. HTN - cont. Metoprolol.   4. Depression - cont. Prozac.   5. Hx of CHF - clinically not in CHF.  - cont. Lasix, b-blocker.   6. GERD - cont. Protonix.   7. Hyperlipidemia - cont. Crestor.   If tolerating PO well and has BM tomorrow then likely d/c home.     All the records are reviewed and case discussed with Care Management/Social Workerr. Management plans discussed with the patient, family and they are in agreement.  CODE STATUS: Full  TOTAL TIME TAKING CARE OF THIS PATIENT: 25 minutes.   POSSIBLE D/C IN 1-2 DAYS, DEPENDING ON CLINICAL CONDITION.   Henreitta Leber M.D on 09/26/2014 at 10:09 AM  Between 7am to 6pm - Pager - [phone]  After 6pm go to [email or web address] - password [email or web address] [facility]  Office  [phone]  CC: Primary care physician; [name], MD

## 2014-09-27 ENCOUNTER — Telehealth: Payer: Self-pay | Admitting: Internal Medicine

## 2014-09-27 DIAGNOSIS — K9189 Other postprocedural complications and disorders of digestive system: Secondary | ICD-10-CM | POA: Diagnosis not present

## 2014-09-27 NOTE — Telephone Encounter (Signed)
Discharge Date: 09/27/14  Transition Care Management Follow-up Telephone Call  How have you been since you were released from the hospital? No appetite. Denies any more nausea or vomiting.    Do you understand why you were in the hospital? YES. Vomiting  Do you understand the discharge instructions? YES. Items Reviewed:  Medications reviewed: YES. No changes per patient.   Allergies reviewed: No new drug allergies, allergies reviewed.   Dietary changes reviewed: low sodium diet  Referrals reviewed: n/a   Functional Questionnaire:   Activities of Daily Living (ADLs):  She states they are independent in the following: Independent in all States they require assistance with the following: Driving until cleared with cardiologist   Any transportation issues/concerns?: NO, will have husband drive until cleared by cardiologist.   Any patient concerns? Not at this time. Advised to call back with any questions/concerns   Confirmed importance and date/time of follow-up visits scheduled: YES, will have to send message to Dr. Gilford Rile for work in appt. No appts currently available.    Confirmed with patient if condition begins to worsen call PCP or go to the ER. Patient was given the Call-a-Nurse line [phone]: YES

## 2014-09-27 NOTE — Discharge Summary (Signed)
[facility]   PATIENT NAME: Debbie Delacruz    MR#:  [id number]  DATE OF BIRTH:  07-01-1945  DATE OF ADMISSION:  09/25/2014 ADMITTING PHYSICIAN: Harrie Foreman, MD  DATE OF DISCHARGE: 09/27/2014  PRIMARY CARE PHYSICIAN: [name], MD    ADMISSION DIAGNOSIS:  Intractable vomiting with nausea, vomiting of unspecified type [R11.10]  DISCHARGE DIAGNOSIS:   Post operative ileus with nausea and vomiting, abdominal pain now resolved   SECONDARY DIAGNOSIS:   Past Medical History  Diagnosis Date  . Hypertension   . Anxiety   . Hyperlipidemia   . GERD (gastroesophageal reflux disease)   . Anemia   . Coronary artery disease     2x stents, Dr. Clayborn Bigness  . Family history of adverse reaction to anesthesia     "brother; S/P lipotripsy in [place]; transferred to [facility]; had to put him on life support for 2 days"  . PONV (postoperative nausea and vomiting)   . Anginal pain   . Myocardial infarct, old 1999    15 years ago  . Sleep apnea     "don't use a mask" (09/13/2014)  . History of blood transfusion 05/2014    "we haven't figured out why I needed it yet" (09/13/2014)  . History of hiatal hernia   . Headache     "weekly" (09/13/2014)  . S/P CABG x 4 09/18/2014    LIMA to LAD, SVG to PDA-dRCA sequentially, SVG to OM, EVH via right thigh   . Dysrhythmia   . Depression     HOSPITAL COURSE:   1. Ileus - causing nausea, vomiting, abdominal pain.  - feeling much better this morning. Has had multiple BM's, tolerating meals, up and walking in hall.  2. Hx of CAD s/p CABG - no chest pain. No evidence of CHF - cont. ASA, B-blocker, Statin.  - will need to follow cardiothoracic surgery recs as before.  3. HTN - cont. Metoprolol.   4. Depression - cont. Prozac.   5. Hx of CHF - clinically not in CHF.  - cont. Lasix, b-blocker.   6. GERD - cont. Protonix.   7. Hyperlipidemia - cont.  Crestor.    DISCHARGE CONDITIONS:   Stable with no additional home health needs  CONSULTS OBTAINED:   none  DRUG ALLERGIES:   Allergies  Allergen Reactions  . Lipitor [Atorvastatin]     myalgia    DISCHARGE MEDICATIONS:   Current Discharge Medication List    CONTINUE these medications which have NOT CHANGED   Details  acetaminophen (TYLENOL) 500 MG tablet Take 500 mg by mouth every 6 (six) hours as needed for headache.    aspirin EC 325 MG EC tablet Take 1 tablet (325 mg total) by mouth daily. Qty: 30 tablet, Refills: 0    ferrous sulfate 325 (65 FE) MG tablet Take 325 mg by mouth daily with breakfast.     FLUoxetine (PROZAC) 20 MG capsule Take 40 mg by mouth daily.    folic acid (FOLVITE) 1 MG tablet Take 1 tablet (1 mg total) by mouth daily. Qty: 30 tablet, Refills: 0    furosemide (LASIX) 40 MG tablet Take 1 tablet (40 mg total) by mouth daily. For 7 Days Qty: 7 tablet, Refills: 0    metoprolol succinate (TOPROL-XL) 50 MG 24 hr tablet TAKE ONE TABLET BY MOUTH ONCE DAILY, WITH OR IMMEDIATELY FOLLOWING A MEAL. Qty: 90 tablet, Refills: 1    oxyCODONE (OXY  IR/ROXICODONE) 5 MG immediate release tablet Take 1-2 tablets (5-10 mg total) by mouth every 3 (three) hours as needed for severe pain. Qty: 30 tablet, Refills: 0    pantoprazole (PROTONIX) 40 MG tablet Take 1 tablet (40 mg total) by mouth 2 (two) times daily. Qty: 60 tablet, Refills: 0    rosuvastatin (CRESTOR) 20 MG tablet Take 40 mg by mouth daily.     traMADol (ULTRAM) 50 MG tablet TAKE 1 OR 2 TABLETS BY MOUTH EVERY 8 HOURS AS NEEDED. Qty: 180 tablet, Refills: 1      STOP taking these medications     omeprazole (PRILOSEC OTC) 20 MG tablet          DISCHARGE INSTRUCTIONS:     If you experience worsening of your admission symptoms, develop shortness of breath, life threatening emergency, suicidal or homicidal thoughts you must seek medical attention immediately by calling 911 or calling your MD  immediately  if symptoms less severe.  You Must read complete instructions/literature along with all the possible adverse reactions/side effects for all the Medicines you take and that have been prescribed to you. Take any new Medicines after you have completely understood and accept all the possible adverse reactions/side effects.   Please note  You were cared for by a hospitalist during your hospital stay. If you have any questions about your discharge medications or the care you received while you were in the hospital after you are discharged, you can call the unit and asked to speak with the hospitalist on call if the hospitalist that took care of you is not available. Once you are discharged, your primary care physician will handle any further medical issues. Please note that NO REFILLS for any discharge medications will be authorized once you are discharged, as it is imperative that you return to your primary care physician (or establish a relationship with a primary care physician if you do not have one) for your aftercare needs so that they can reassess your need for medications and monitor your lab values.    Today   CHIEF COMPLAINT:   Chief Complaint  Patient presents with  . Abdominal Pain  . Constipation    HISTORY OF PRESENT ILLNESS:  HPI: the patient presents to the emergency department complaining of nausea for 3 days. The patient has had nonbloody emesis for 1 day. Unfortunately, all of her vomit has been bilious in color. The patient recently underwent coronary artery bypass graft surgery. She has not been able to eat more than a few bites of pastry and perhaps 6 ounces of water due to her upset stomach for the last few days. She states that her chest only hurts when she is vomiting. She denies any shortness of breath or fevers. In the emergency department the patient was given antibiotics but continued to have bilious vomiting. She also states that she has not been able to have  a bowel movement in nearly a week. She was seen in emergency department a few days ago and given an enema which only yielded a few small pieces of hard stool. Due to intractable vomiting emergency department staff called for admission.    VITAL SIGNS:  Blood pressure 158/77, pulse 75, temperature 98 F (36.7 C), temperature source Oral, resp. rate 18, height 5\' 2"  (1.575 m), weight 80.513 kg (177 lb 8 oz), SpO2 98 %.  I/O:   Intake/Output Summary (Last 24 hours) at 09/27/14 1017 Last data filed at 09/27/14 0506  Gross per 24 hour  Intake    360 ml  Output      0 ml  Net    360 ml    PHYSICAL EXAMINATION:  GENERAL:  69 y.o.-year-old patient sitting in chair with no acute distress.  LUNGS: Normal breath sounds bilaterally, no wheezing, rales,rhonchi or crepitation. No use of accessory muscles of respiration.  CARDIOVASCULAR: S1, S2 normal. No murmurs, rubs, or gallops. Sternal scar healing well ABDOMEN: Soft, non-tender, non-distended. Bowel sounds present. No organomegaly or mass.  EXTREMITIES: No pedal edema, cyanosis, or clubbing.  NEUROLOGIC: Cranial nerves II through XII are intact. Muscle strength 5/5 in all extremities. Sensation intact. Gait normal, up and walking in the hall PSYCHIATRIC: The patient is alert and oriented x 3.  SKIN: No obvious rash, lesion, or ulcer.   DATA REVIEW:   CBC  Recent Labs Lab 09/25/14 2008  WBC 8.6  HGB 10.3*  HCT 32.8*  PLT 310    Chemistries   Recent Labs Lab 09/26/14 0419  NA 142  K 3.4*  CL 107  CO2 26  GLUCOSE 95  BUN 15  CREATININE 0.67  CALCIUM 8.3*  AST 18  ALT 13*  ALKPHOS 45  BILITOT 1.0    Cardiac Enzymes No results for input(s): TROPONINI in the last 168 hours.  Microbiology Results  Results for orders placed or performed during the hospital encounter of 09/13/14  Surgical pcr screen     Status: None   Collection Time: 09/18/14  5:00 AM  Result Value Ref Range Status   MRSA, PCR NEGATIVE NEGATIVE Final    Staphylococcus aureus NEGATIVE NEGATIVE Final    Comment:        The Xpert SA Assay (FDA approved for NASAL specimens in patients over 63 years of age), is one component of a comprehensive surveillance program.  Test performance has been validated by [facility] for patients greater than or equal to 101 year old. It is not intended to diagnose infection nor to guide or monitor treatment.     RADIOLOGY:  No results found.  EKG:   Orders placed or performed during the hospital encounter of 09/25/14  . EKG 12-Lead  . EKG 12-Lead      Management plans discussed with the patient, family and they are in agreement.  CODE STATUS:     Code Status Orders        Start     Ordered   09/25/14 2008  Full code   Continuous     09/25/14 2007    Advance Directive Documentation        Most Recent Value   Type of Advance Directive  Healthcare Power of Attorney, Living will   Pre-existing out of facility DNR order (yellow form or pink MOST form)     "MOST" Form in Place?        TOTAL TIME TAKING CARE OF THIS PATIENT: 45 minutes.    Myrtis Ser M.D on 09/27/2014 at 10:17 AM  Between 7am to 6pm - Pager - [phone]  After 6pm go to [email or web address] - password [email or web address] [facility]  Office  [phone]  CC: Primary care physician; [name], MD

## 2014-09-27 NOTE — Telephone Encounter (Signed)
TCM call completed. Offered appt 10/01/14 with Dr. Gilford Rile. Pt states unable to have any appointments between 2 and 4 each day, has school pick up. Is there another date you would like to work her in?

## 2014-09-27 NOTE — Progress Notes (Signed)
Pt resting well between care. No c/o pain or SOB. 1 assist to bathroom. Will continue to monitor.

## 2014-09-27 NOTE — Telephone Encounter (Signed)
Can we use an open Tuesday morning 32min slot?

## 2014-09-27 NOTE — Telephone Encounter (Signed)
Pt needs HFU for vomiting and nausea. Pt being discharged today. Please advise where to add pt to the schedule.

## 2014-09-27 NOTE — Progress Notes (Signed)
VSS. Room air. No tele. Pt has not reported any pain. BM 5/4. Husband at the bedside. Takes meds ok. A & O. IV removed. Discharge instructions given to pt. Standby assist to BR. Pt has no further concerns at this time.

## 2014-09-27 NOTE — Telephone Encounter (Signed)
I have 3:00 10/01/14 blocked on Walker;s schedule for a hospital f/u, see if she can come then? Let me know, if she makes it then, no TCM call needed, within 2 business days. Thanks!

## 2014-09-28 DIAGNOSIS — Z736 Limitation of activities due to disability: Secondary | ICD-10-CM

## 2014-10-01 NOTE — Telephone Encounter (Signed)
Spoke to pt, appt scheduled 10/03/14 with Dr. Gilford Rile.

## 2014-10-03 ENCOUNTER — Ambulatory Visit: Payer: Medicare Other | Admitting: Internal Medicine

## 2014-10-03 DIAGNOSIS — Z0289 Encounter for other administrative examinations: Secondary | ICD-10-CM

## 2014-10-12 ENCOUNTER — Other Ambulatory Visit: Payer: Self-pay | Admitting: Thoracic Surgery (Cardiothoracic Vascular Surgery)

## 2014-10-12 DIAGNOSIS — Z951 Presence of aortocoronary bypass graft: Secondary | ICD-10-CM

## 2014-10-15 ENCOUNTER — Ambulatory Visit
Admission: RE | Admit: 2014-10-15 | Discharge: 2014-10-15 | Disposition: A | Discharge status: Home / Self Care | Payer: Medicare Other | Source: Ambulatory Visit | Attending: Thoracic Surgery (Cardiothoracic Vascular Surgery) | Admitting: Thoracic Surgery (Cardiothoracic Vascular Surgery)

## 2014-10-15 ENCOUNTER — Ambulatory Visit (INDEPENDENT_AMBULATORY_CARE_PROVIDER_SITE_OTHER): Payer: Self-pay | Admitting: Physician Assistant

## 2014-10-15 VITALS — BP 153/98 | HR 100 | Resp 20 | Ht 62.0 in

## 2014-10-15 DIAGNOSIS — Z951 Presence of aortocoronary bypass graft: Secondary | ICD-10-CM

## 2014-10-15 DIAGNOSIS — I25119 Atherosclerotic heart disease of native coronary artery with unspecified angina pectoris: Secondary | ICD-10-CM

## 2014-10-15 NOTE — Progress Notes (Signed)
[address]       [place] [address]             [phone]          HPI: Patient returns for routine postoperative follow-up having undergone CABG x 4 by Dr. Roxy Manns on 09/18/2014.  The patient's postoperative course was generally uneventful and she was discharged home on 09/23/2014. She ended up returning to the ER at [facility] 2 days later with constipation and nausea. She was treated with an emema and was discharged home after having a small BM, but returned to the ER on 5/3 with intractable nausea and vomiting.  She was found to have an ileus and was admitted for further treatment.  She was managed conservatively with minimization of narcotics and an aggressive bowel regimen and was her ileus did eventually resolve.  She was discharged home on 5/4 in improved condition.   Since discharge, she has continued to progress.  She still has decreased appetite, but no further nausea and vomiting.  Bowel function is back to normal. She is ambulating daily and feels that her breathing is significantly improved since prior to surgery. She has had minimal chest discomfort and has not taken any pain medication since discharge.  She has been seen by her cardiologist and is scheduled for further follow up in September. Overall, she is doing well and has no complaints today.    Current Outpatient Prescriptions  Medication Sig Dispense Refill  . acetaminophen (TYLENOL) 500 MG tablet Take 500 mg by mouth every 6 (six) hours as needed for headache.    Marland Kitchen aspirin EC 325 MG EC tablet Take 1 tablet (325 mg total) by mouth daily. 30 tablet 0  . ferrous sulfate 325 (65 FE) MG tablet Take 325 mg by mouth daily with breakfast.     . FLUoxetine (PROZAC) 20 MG capsule Take 40 mg by mouth daily.    . folic acid (FOLVITE) 1 MG tablet Take 1 tablet (1 mg total) by mouth daily. 30 tablet 0  . metoprolol succinate (TOPROL-XL) 50 MG 24 hr tablet TAKE ONE TABLET BY MOUTH ONCE DAILY, WITH OR IMMEDIATELY  FOLLOWING A MEAL. 90 tablet 1  . pantoprazole (PROTONIX) 40 MG tablet Take 1 tablet (40 mg total) by mouth 2 (two) times daily. 60 tablet 0  . rosuvastatin (CRESTOR) 20 MG tablet Take 40 mg by mouth daily.     . traMADol (ULTRAM) 50 MG tablet TAKE 1 OR 2 TABLETS BY MOUTH EVERY 8 HOURS AS NEEDED. (Patient taking differently: TAKE 1 OR 2 TABLETS BY MOUTH EVERY 8 HOURS AS NEEDED FOR PAIN) 180 tablet 1   No current facility-administered medications for this visit.     Physical Exam: BP 153/98 HR 100 Resp 20 Wounds: Sternal and right leg wounds healing well. Sternum is stable to palpation. Heart: regular rate and rhythm Lungs: Clear to auscultation Extremities: Trace right lower extremity edema   Diagnostic Tests: Chest xray: Dg Chest 2 View  10/15/2014   CLINICAL DATA:  CABG.  Soreness.  EXAM: CHEST  2 VIEW  COMPARISON:  09/21/2014.  FINDINGS: Mediastinum hilar structures are normal. Prior CABG. Stable cardiomegaly. No pulmonary venous congestion. Mild left base subsegmental atelectasis with small left pleural effusion. No acute bony abnormality.  IMPRESSION: 1. Prior CABG.  Mild cardiomegaly.  No pulmonary venous congestion. 2. Mild left base subsegmental atelectasis and small left pleural effusion.   Electronically Signed   By: Marcello Moores  Register  On: 10/15/2014 12:29       Assessment/Plan: Ms. Hohn is overall doing well status post CABG.  She was admitted to [facility] with a postop ileus, but this has since resolved and bowel function is back to baseline.  I went ahead and discontinued her oral iron today since she feels it is affecting her appetite. She has been seen by Dr. Clayborn Bigness and is doing well from his standpoint. Her blood pressure is elevated today, but she states it has been within normal limits and that she hasn't take her medications yet today.  These may need to be adjusted if her BPs remain elevated.  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  She has been contacted by cardiac rehab and plans to enroll once her work schedule is finalized.  She may begin driving at this point and increase her activity as tolerated.  We will see her back in 1 month for follow up, or sooner if she has any problems.

## 2014-10-30 ENCOUNTER — Encounter: Payer: Self-pay | Admitting: Internal Medicine

## 2014-11-19 ENCOUNTER — Ambulatory Visit (INDEPENDENT_AMBULATORY_CARE_PROVIDER_SITE_OTHER): Payer: Self-pay | Admitting: Thoracic Surgery (Cardiothoracic Vascular Surgery)

## 2014-11-19 ENCOUNTER — Encounter: Payer: Self-pay | Admitting: Thoracic Surgery (Cardiothoracic Vascular Surgery)

## 2014-11-19 VITALS — BP 149/87 | HR 78 | Resp 16 | Ht 62.0 in | Wt 176.0 lb

## 2014-11-19 DIAGNOSIS — Z951 Presence of aortocoronary bypass graft: Secondary | ICD-10-CM

## 2014-11-19 DIAGNOSIS — I25119 Atherosclerotic heart disease of native coronary artery with unspecified angina pectoris: Secondary | ICD-10-CM

## 2014-11-19 NOTE — Patient Instructions (Addendum)
The patient should continue to avoid any heavy lifting or strenuous use of arms or shoulders for at least a total of three months from the time of surgery.  The patient is encouraged to enroll and participate in the outpatient cardiac rehab program beginning as soon as practical.  The patient is reminded of the numerous potential long-term benefits of regular exercise and adherence to a "heart healthy" diet  Keep an eye on your blood pressure and report your readings to your primary care physician

## 2014-11-19 NOTE — Progress Notes (Signed)
[address]       [place] [address]             [phone]     CARDIOTHORACIC SURGERY OFFICE NOTE  Referring Provider is Yolonda Kida, MD PCP is [name], MD   HPI:  Patient returns for routine follow-up status post coronary artery bypass grafting 4 on 09/18/2014 for severe three-vessel coronary artery disease with unstable angina pectoris.  Her early postoperative recovery was notable for postoperative nausea and vomiting and constipation requiring readmission to the hospital at [facility] in early May.  These problems resolved by minimizing narcotic administration and the patient has subsequently done very well. She was last seen here in our office on 10/15/2014. Since then she has continued to do well. She reports excellent improvement in her overall exercise tolerance. She is walking every day, as much as 2 miles at a time. She recently purchased a bicycle in hopes to start riding her bicycle for exercise. She plans to enroll in the cardiac rehabilitation program. She has no significant residual soreness in her chest. Her appetite is normal. She is sleeping well. She feels well overall.   Current Outpatient Prescriptions  Medication Sig Dispense Refill  . acetaminophen (TYLENOL) 500 MG tablet Take 500 mg by mouth every 6 (six) hours as needed for headache.    Marland Kitchen aspirin EC 325 MG EC tablet Take 1 tablet (325 mg total) by mouth daily. 30 tablet 0  . FLUoxetine (PROZAC) 20 MG capsule Take 40 mg by mouth daily.    . metoprolol succinate (TOPROL-XL) 50 MG 24 hr tablet TAKE ONE TABLET BY MOUTH ONCE DAILY, WITH OR IMMEDIATELY FOLLOWING A MEAL. 90 tablet 1  . pantoprazole (PROTONIX) 40 MG tablet Take 1 tablet (40 mg total) by mouth 2 (two) times daily. 60 tablet 0  . rosuvastatin (CRESTOR) 20 MG tablet Take 40 mg by mouth daily.     . traMADol (ULTRAM) 50 MG tablet TAKE 1 OR 2 TABLETS BY MOUTH EVERY 8 HOURS AS NEEDED. (Patient taking differently: TAKE 1  OR 2 TABLETS BY MOUTH EVERY 8 HOURS AS NEEDED FOR PAIN) 180 tablet 1   No current facility-administered medications for this visit.      Physical Exam:   BP 149/87 mmHg  Pulse 78  Resp 16  Ht 5\' 2"  (1.575 m)  Wt 176 lb (79.833 kg)  BMI 32.18 kg/m2  SpO2 97%  General:  Well-appearing  Chest:   clear  CV:   Regular rate and rhythm without murmur  Incisions:  Healing nicely, sternum is stable  Abdomen:  Soft and nontender  Extremities:  Warm and well-perfused  Diagnostic Tests:  n/a   Impression:  Patient is doing very well 2 months status post coronary artery bypass grafting.  Plan:  I have encouraged the patient to continue to gradually increase her physical activity with her only limitations at this point remaining that she refrain from heavy lifting or other strenuous use of her arms and shoulders for at least another 4-6 weeks.  We have not recommended any changes to the patient's current medications at this time. The patient will return for routine follow-up next April, approximately 1 year following her original surgery.  Otherwise she will call and return to see [place] as needed.  During the interim period time she will continue to follow-up with Dr. Clayborn Bigness and Dr. Gilford Rile.     Valentina Gu. Roxy Manns, MD 11/19/2014 5:02 PM

## 2014-12-10 ENCOUNTER — Ambulatory Visit: Payer: Medicare Other | Admitting: Internal Medicine

## 2014-12-10 DIAGNOSIS — Z0289 Encounter for other administrative examinations: Secondary | ICD-10-CM

## 2015-01-08 ENCOUNTER — Encounter: Payer: Self-pay | Admitting: Thoracic Surgery (Cardiothoracic Vascular Surgery)

## 2015-01-17 ENCOUNTER — Other Ambulatory Visit: Payer: Medicare Other

## 2015-01-21 ENCOUNTER — Other Ambulatory Visit: Payer: Medicare Other

## 2015-01-30 ENCOUNTER — Ambulatory Visit: Payer: Medicare Other | Admitting: Internal Medicine

## 2015-01-30 ENCOUNTER — Telehealth: Payer: Self-pay | Admitting: *Deleted

## 2015-01-30 NOTE — Telephone Encounter (Signed)
Patient canceled appt, from today and wants to reschedule within the next two week , please advise where to put patient on Dr. Thomes Dinning schedule.-Thanks

## 2015-02-01 NOTE — Telephone Encounter (Signed)
Left vm for pt to return my call, pt's are available for next week

## 2015-02-18 ENCOUNTER — Encounter: Payer: Self-pay | Admitting: Internal Medicine

## 2015-02-19 ENCOUNTER — Other Ambulatory Visit: Payer: Self-pay

## 2015-02-19 ENCOUNTER — Telehealth: Payer: Self-pay

## 2015-02-19 MED ORDER — FLUOXETINE HCL 20 MG PO CAPS: 40.0000 mg | ORAL_CAPSULE | Freq: Every day | ORAL | Status: DC

## 2015-02-19 MED ORDER — METOPROLOL SUCCINATE ER 50 MG PO TB24
50.0000 mg | ORAL_TABLET | Freq: Every day | ORAL | Status: DC
Start: 2015-02-19 — End: 2016-05-15

## 2015-02-19 MED ORDER — AMLODIPINE BESYLATE 10 MG PO TABS: 10.0000 mg | ORAL_TABLET | Freq: Every day | ORAL | Status: DC

## 2015-02-19 NOTE — Telephone Encounter (Signed)
Called her to ask her about her rx request.

## 2015-02-19 NOTE — Telephone Encounter (Signed)
Left voice mail to call back 

## 2015-02-19 NOTE — Telephone Encounter (Signed)
I don't typically write for that high of a dose of HCTZ. Please decline refill. May need to check with pt to see if another provider has written this.

## 2015-02-19 NOTE — Telephone Encounter (Signed)
[facility]

## 2015-02-19 NOTE — Telephone Encounter (Signed)
Pts pharmacy sent in a rx for hydrochlorothiazide 50 mg. i dont see on her list. Please advise?

## 2015-02-20 DIAGNOSIS — R1084 Generalized abdominal pain: Secondary | ICD-10-CM | POA: Diagnosis not present

## 2015-02-20 DIAGNOSIS — R1111 Vomiting without nausea: Secondary | ICD-10-CM | POA: Diagnosis not present

## 2015-03-07 ENCOUNTER — Inpatient Hospital Stay (HOSPITAL_COMMUNITY)
Admission: EM | Admit: 2015-03-07 | Discharge: 2015-03-09 | DRG: 392 | Disposition: A | Discharge status: Home / Self Care | Payer: Medicare Other | Attending: Internal Medicine | Admitting: Internal Medicine

## 2015-03-07 ENCOUNTER — Encounter (HOSPITAL_COMMUNITY): Payer: Self-pay | Admitting: *Deleted

## 2015-03-07 ENCOUNTER — Emergency Department (HOSPITAL_COMMUNITY): Payer: Medicare Other

## 2015-03-07 DIAGNOSIS — F419 Anxiety disorder, unspecified: Secondary | ICD-10-CM | POA: Diagnosis not present

## 2015-03-07 DIAGNOSIS — G4730 Sleep apnea, unspecified: Secondary | ICD-10-CM | POA: Diagnosis not present

## 2015-03-07 DIAGNOSIS — F329 Major depressive disorder, single episode, unspecified: Secondary | ICD-10-CM | POA: Diagnosis present

## 2015-03-07 DIAGNOSIS — Z888 Allergy status to other drugs, medicaments and biological substances status: Secondary | ICD-10-CM

## 2015-03-07 DIAGNOSIS — E785 Hyperlipidemia, unspecified: Secondary | ICD-10-CM | POA: Diagnosis present

## 2015-03-07 DIAGNOSIS — N179 Acute kidney failure, unspecified: Secondary | ICD-10-CM

## 2015-03-07 DIAGNOSIS — R1110 Vomiting, unspecified: Secondary | ICD-10-CM | POA: Diagnosis not present

## 2015-03-07 DIAGNOSIS — I25110 Atherosclerotic heart disease of native coronary artery with unstable angina pectoris: Secondary | ICD-10-CM | POA: Diagnosis present

## 2015-03-07 DIAGNOSIS — I252 Old myocardial infarction: Secondary | ICD-10-CM

## 2015-03-07 DIAGNOSIS — R404 Transient alteration of awareness: Secondary | ICD-10-CM | POA: Diagnosis not present

## 2015-03-07 DIAGNOSIS — R112 Nausea with vomiting, unspecified: Secondary | ICD-10-CM | POA: Diagnosis not present

## 2015-03-07 DIAGNOSIS — Z951 Presence of aortocoronary bypass graft: Secondary | ICD-10-CM

## 2015-03-07 DIAGNOSIS — K2970 Gastritis, unspecified, without bleeding: Secondary | ICD-10-CM | POA: Diagnosis not present

## 2015-03-07 DIAGNOSIS — E669 Obesity, unspecified: Secondary | ICD-10-CM | POA: Diagnosis not present

## 2015-03-07 DIAGNOSIS — Z955 Presence of coronary angioplasty implant and graft: Secondary | ICD-10-CM | POA: Diagnosis not present

## 2015-03-07 DIAGNOSIS — I2510 Atherosclerotic heart disease of native coronary artery without angina pectoris: Secondary | ICD-10-CM | POA: Diagnosis not present

## 2015-03-07 DIAGNOSIS — R1114 Bilious vomiting: Secondary | ICD-10-CM

## 2015-03-07 DIAGNOSIS — K210 Gastro-esophageal reflux disease with esophagitis: Secondary | ICD-10-CM | POA: Diagnosis not present

## 2015-03-07 DIAGNOSIS — K2270 Barrett's esophagus without dysplasia: Secondary | ICD-10-CM | POA: Diagnosis not present

## 2015-03-07 DIAGNOSIS — E869 Volume depletion, unspecified: Secondary | ICD-10-CM | POA: Diagnosis present

## 2015-03-07 DIAGNOSIS — R55 Syncope and collapse: Secondary | ICD-10-CM | POA: Diagnosis not present

## 2015-03-07 DIAGNOSIS — Z9884 Bariatric surgery status: Secondary | ICD-10-CM

## 2015-03-07 DIAGNOSIS — I10 Essential (primary) hypertension: Secondary | ICD-10-CM | POA: Diagnosis not present

## 2015-03-07 LAB — URINALYSIS, ROUTINE W REFLEX MICROSCOPIC
Bilirubin Urine: NEGATIVE
Glucose, UA: NEGATIVE mg/dL
Hgb urine dipstick: NEGATIVE
Ketones, ur: 15 mg/dL — AB
Leukocytes, UA: NEGATIVE
Nitrite: NEGATIVE
Protein, ur: NEGATIVE mg/dL
Specific Gravity, Urine: 1.02 (ref 1.005–1.030)
UROBILINOGEN UA: 0.2 mg/dL (ref 0.0–1.0)
pH: 6.5 (ref 5.0–8.0)

## 2015-03-07 LAB — COMPREHENSIVE METABOLIC PANEL
ALBUMIN: 4.4 g/dL (ref 3.5–5.0)
ALK PHOS: 58 U/L (ref 38–126)
ALT: 22 U/L (ref 14–54)
ANION GAP: 12 (ref 5–15)
AST: 31 U/L (ref 15–41)
BUN: 23 mg/dL — ABNORMAL HIGH (ref 6–20)
CALCIUM: 10.4 mg/dL — AB (ref 8.9–10.3)
CO2: 24 mmol/L (ref 22–32)
CREATININE: 1.05 mg/dL — AB (ref 0.44–1.00)
Chloride: 102 mmol/L (ref 101–111)
GFR calc Af Amer: >60 mL/min (ref >60)
GFR calc non Af Amer: 54 mL/min — ABNORMAL LOW (ref >60)
GLUCOSE: 154 mg/dL — AB (ref 65–99)
Potassium: 4 mmol/L (ref 3.5–5.1)
Sodium: 138 mmol/L (ref 135–145)
TOTAL PROTEIN: 7.8 g/dL (ref 6.5–8.1)
Total Bilirubin: 0.8 mg/dL (ref 0.3–1.2)

## 2015-03-07 LAB — CBC
HCT: 46.5 % — ABNORMAL HIGH (ref 36.0–46.0)
Hemoglobin: 15.8 g/dL — ABNORMAL HIGH (ref 12.0–15.0)
MCH: 31.2 pg (ref 26.0–34.0)
MCHC: 34 g/dL (ref 30.0–36.0)
MCV: 91.9 fL (ref 78.0–100.0)
PLATELETS: 231 10*3/uL (ref 150–400)
RBC: 5.06 MIL/uL (ref 3.87–5.11)
RDW: 13.4 % (ref 11.5–15.5)
WBC: 11.4 10*3/uL — ABNORMAL HIGH (ref 4.0–10.5)

## 2015-03-07 LAB — TROPONIN I: TROPONIN I: 0.03 ng/mL (ref <0.031)

## 2015-03-07 LAB — LIPASE, BLOOD: Lipase: 29 U/L (ref 22–51)

## 2015-03-07 MED ORDER — SODIUM CHLORIDE 0.9 % IV SOLN: INTRAVENOUS | Status: DC

## 2015-03-07 MED ORDER — PROMETHAZINE HCL 25 MG/ML IJ SOLN
12.5000 mg | Freq: Once | INTRAMUSCULAR | Status: AC
  Administered 2015-03-07: 12.5 mg via INTRAVENOUS
  Filled 2015-03-07: qty 1

## 2015-03-07 MED ORDER — SODIUM CHLORIDE 0.9 % IV SOLN: Freq: Once | INTRAVENOUS | Status: AC

## 2015-03-07 MED ORDER — ONDANSETRON HCL 4 MG/2ML IJ SOLN
4.0000 mg | Freq: Three times a day (TID) | INTRAMUSCULAR | Status: DC | PRN
Start: 2015-03-07 — End: 2015-03-07

## 2015-03-07 MED ORDER — ISOSORBIDE MONONITRATE ER 60 MG PO TB24
60.0000 mg | ORAL_TABLET | Freq: Every day | ORAL | Status: DC
  Administered 2015-03-09: 60 mg via ORAL
  Filled 2015-03-07 (×2): qty 1

## 2015-03-07 MED ORDER — ONDANSETRON HCL 4 MG/2ML IJ SOLN
4.0000 mg | Freq: Four times a day (QID) | INTRAMUSCULAR | Status: DC | PRN
  Administered 2015-03-08: 4 mg via INTRAVENOUS
  Filled 2015-03-07: qty 2

## 2015-03-07 MED ORDER — ENOXAPARIN SODIUM 40 MG/0.4ML ~~LOC~~ SOLN
40.0000 mg | Freq: Every day | SUBCUTANEOUS | Status: DC
  Administered 2015-03-08 – 2015-03-09 (×2): 40 mg via SUBCUTANEOUS
  Filled 2015-03-07 (×2): qty 0.4

## 2015-03-07 MED ORDER — DEXTROSE-NACL 5-0.45 % IV SOLN
INTRAVENOUS | Status: DC
  Administered 2015-03-08: 01:00:00 via INTRAVENOUS

## 2015-03-07 MED ORDER — METOPROLOL SUCCINATE ER 50 MG PO TB24
50.0000 mg | ORAL_TABLET | Freq: Every day | ORAL | Status: DC
  Administered 2015-03-09: 50 mg via ORAL
  Filled 2015-03-07 (×2): qty 1

## 2015-03-07 MED ORDER — METOCLOPRAMIDE HCL 5 MG/ML IJ SOLN
10.0000 mg | Freq: Once | INTRAMUSCULAR | Status: AC
  Administered 2015-03-07: 10 mg via INTRAVENOUS
  Filled 2015-03-07: qty 2

## 2015-03-07 MED ORDER — SODIUM CHLORIDE 0.9 % IV BOLUS (SEPSIS)
500.0000 mL | Freq: Once | INTRAVENOUS | Status: AC
  Administered 2015-03-07: 500 mL via INTRAVENOUS

## 2015-03-07 MED ORDER — PANTOPRAZOLE SODIUM 40 MG IV SOLR
40.0000 mg | Freq: Two times a day (BID) | INTRAVENOUS | Status: DC
  Administered 2015-03-08 (×3): 40 mg via INTRAVENOUS
  Filled 2015-03-07 (×4): qty 40

## 2015-03-07 MED ORDER — SODIUM CHLORIDE 0.9 % IV SOLN
Freq: Once | INTRAVENOUS | Status: AC
  Administered 2015-03-07: 16:00:00 via INTRAVENOUS

## 2015-03-07 MED ORDER — ASPIRIN EC 325 MG PO TBEC
325.0000 mg | DELAYED_RELEASE_TABLET | Freq: Every day | ORAL | Status: DC
  Administered 2015-03-09: 325 mg via ORAL
  Filled 2015-03-07 (×2): qty 1

## 2015-03-07 MED ORDER — ONDANSETRON HCL 4 MG/2ML IJ SOLN
4.0000 mg | Freq: Once | INTRAMUSCULAR | Status: AC
  Administered 2015-03-07: 4 mg via INTRAVENOUS
  Filled 2015-03-07: qty 2

## 2015-03-07 NOTE — ED Notes (Signed)
Patient with active vomiting, EDP aware, comfort measures taken

## 2015-03-07 NOTE — ED Notes (Signed)
Patient with large watery stool, patient unable to control bowels due to vomiting. Patient cleaned and bed changed. EDP made aware.

## 2015-03-07 NOTE — ED Notes (Signed)
Attempted report 

## 2015-03-07 NOTE — H&P (Signed)
History and Physical  Debbie Delacruz NID:[id number] DOB: 1945/11/06 DOA: 03/07/2015  PCP: [name], MD   Chief Complaint: vomiting   History of Present Illness:  Patient is a 69 year old female with history of CAD s/p CABG in April, HTN, Barrett esophagus and several admissions with ileus who came here today with cc of vomiting that started today around noon. She has not eaten anything except cereals before she vomited at least once around noon then she had a sandwich from [employer] that was followed by frequent vomiting of bile with no blood. She had watery diarrhea and epigastric abdominal pain. No fever, chills, dyspnea, cough, dysuria, chest pain. No constipation   Review of Systems:  CONSTITUTIONAL:  No night sweats.  No fatigue, malaise, lethargy.  No fever or chills. Eyes:  No visual changes.  No eye pain.  No eye discharge.   ENT:    No epistaxis.  No sinus pain.  No sore throat.  No ear pain.  No congestion. RESPIRATORY:  No cough.  No wheeze.  No hemoptysis.  No shortness of breath. CARDIOVASCULAR:  No chest pains.  No palpitations. GASTROINTESTINAL:  +abdominal pain.  +nausea +vomiting.  +diarrhea .  No hematemesis.  No hematochezia.  No melena. GENITOURINARY:  No urgency.  No frequency.  No dysuria.  No hematuria.  No obstructive symptoms.  No discharge.  No pain.  No significant abnormal bleeding. MUSCULOSKELETAL:  No musculoskeletal pain.  No joint swelling.  No arthritis. NEUROLOGICAL:  No confusion.  No weakness. No headache. No seizure. PSYCHIATRIC:  No depression. No anxiety. No suicidal ideation. SKIN:  No rashes.  No lesions.  No wounds. ENDOCRINE:  No unexplained weight loss.  No polydipsia.  No polyuria.  No polyphagia. HEMATOLOGIC:  No anemia.  No purpura.  No petechiae.  No bleeding.  ALLERGIC AND IMMUNOLOGIC:  No pruritus.  No swelling Other:  Past Medical and Surgical History:   Past Medical History  Diagnosis Date  . CAD (coronary  artery disease)   . Myocardial infarct ([place])   . Carpal tunnel syndrome     bilateral  . Hyperglycemia   . Sleep apnea   . Obesity   . Unstable angina ([place])   . Hypertension   . Anxiety   . Hyperlipidemia   . GERD (gastroesophageal reflux disease)   . Anemia   . Coronary artery disease     2x stents, Dr. Clayborn Bigness  . Family history of adverse reaction to anesthesia     "brother; S/P lipotripsy in [place]; transferred to [facility]; had to put him on life support for 2 days"  . PONV (postoperative nausea and vomiting)   . Anginal pain ([place])   . Myocardial infarct, old 1999    15 years ago  . Sleep apnea     "don't use a mask" (09/13/2014)  . History of blood transfusion 05/2014    "we haven't figured out why I needed it yet" (09/13/2014)  . History of hiatal hernia   . Headache     "weekly" (09/13/2014)  . S/P CABG x 4 09/18/2014    LIMA to LAD, SVG to PDA-dRCA sequentially, SVG to OM, EVH via right thigh   . Dysrhythmia   . Depression    Past Surgical History  Procedure Laterality Date  . Cholecystectomy    . Lumbar spine surgery    . Inguinal hernia repair    . Abdominal hysterectomy    . Removal of ovaries    .  Bladder surgery    . Heart stents      1999  . Incontinence surgery    . Cataract extraction w/ intraocular lens  implant, bilateral    . Carpal tunnel release Left   . Back surgery    . Laparoscopic cholecystectomy    . Inguinal hernia repair Right X 3    "last one was in the 1990's; still have hernia there now" (09/13/2014)  . Coronary angioplasty with stent placement  1999    [facility] x2 stent  . Cardiac catheterization  06/2014  . Appendectomy    . Vaginal hysterectomy    . Lumbar disc surgery  1990's?  . Esophagogastroduodenoscopy N/A 09/17/2014    Procedure: ESOPHAGOGASTRODUODENOSCOPY (EGD);  Surgeon: Inda Castle, MD;  Location: [facility];  Service: Endoscopy;  Laterality: N/A;  . Coronary artery bypass graft N/A 09/18/2014    Procedure:  CORONARY ARTERY BYPASS GRAFTING (CABG)times four using LIMA  to LAD:SVG to  PD and DIST RCA;SVG to OM;, EVH Right thigh;  Surgeon: [name], MD;  Location: [facility];  Service: Open Heart Surgery;  Laterality: N/A;  . Tee without cardioversion N/A 09/18/2014    Procedure: TRANSESOPHAGEAL ECHOCARDIOGRAM (TEE);  Surgeon: [name], MD;  Location: [facility];  Service: Open Heart Surgery;  Laterality: N/A;    Social History:   reports that she has never smoked. She has never used smokeless tobacco. She reports that she drinks alcohol. She reports that she does not use illicit drugs.   Allergies  Allergen Reactions  . Lipitor [Atorvastatin]     myalgia    Family History  Problem Relation Age of Onset  . Coronary artery disease Mother   . Osteoporosis Mother   . Hypertension Father   . Coronary artery disease Father   . Colon cancer Sister   . Stroke Maternal Grandfather   . Heart disease Mother   . Stroke Mother   . Heart attack Mother   . Hypertension Mother   . COPD Father   . Heart disease Father   . Heart attack Father   . Glaucoma Father   . Osteoporosis Father   . Emphysema Father   . Cancer Sister 23    colon  . Hypertension Sister   . Stroke Brother   . Colon cancer Brother   . Hypertension Brother   . Esophageal cancer Neg Hx   . Stomach cancer Neg Hx   . Rectal cancer Neg Hx   . Stroke Maternal Grandmother   . Osteoporosis Maternal Grandmother   . Hypertension Maternal Grandmother   . Hypertension Paternal Grandmother   . Hypertension Paternal Grandfather       Prior to Admission medications   Medication Sig Start Date End Date Taking? Authorizing Provider  acetaminophen (TYLENOL) 500 MG tablet Take 500 mg by mouth every 6 (six) hours as needed for headache.    Historical Provider, MD  amLODipine (NORVASC) 10 MG tablet Take 1 tablet (10 mg total) by mouth daily. 02/19/15   [name], MD  aspirin EC 325 MG EC tablet Take 1 tablet (325 mg total) by  mouth daily. 09/23/14   Erin R Barrett, PA-C  aspirin EC 81 MG tablet Take 81 mg by mouth daily.    Historical Provider, MD  ferrous fumarate (HEMOCYTE - 106 MG FE) 325 (106 FE) MG TABS tablet Take 1 tablet by mouth daily.    Historical Provider, MD  FLUoxetine (PROZAC) 20 MG capsule Take 2 capsules (  40 mg total) by mouth daily. 02/19/15   [name], MD  isosorbide mononitrate (IMDUR) 60 MG 24 hr tablet Take 60 mg by mouth daily.    Historical Provider, MD  metoprolol succinate (TOPROL-XL) 50 MG 24 hr tablet Take 50 mg by mouth daily. Take with or immediately following a meal.    Historical Provider, MD  metoprolol succinate (TOPROL-XL) 50 MG 24 hr tablet Take 1 tablet (50 mg total) by mouth daily. Take with or immediately following a meal. 02/19/15   [name], MD  omeprazole (PRILOSEC OTC) 20 MG tablet Take 20 mg by mouth daily.    Historical Provider, MD  pantoprazole (PROTONIX) 40 MG tablet Take 1 tablet (40 mg total) by mouth 2 (two) times daily. 09/11/14   [name], MD  rosuvastatin (CRESTOR) 20 MG tablet Take 40 mg by mouth daily.     Historical Provider, MD  rosuvastatin (CRESTOR) 40 MG tablet Take 40 mg by mouth daily.    Historical Provider, MD  traMADol (ULTRAM) 50 MG tablet TAKE 1 OR 2 TABLETS BY MOUTH EVERY 8 HOURS AS NEEDED. Patient taking differently: TAKE 1 OR 2 TABLETS BY MOUTH EVERY 8 HOURS AS NEEDED FOR PAIN 04/02/14   [name], MD  traMADol (ULTRAM) 50 MG tablet Take by mouth every 6 (six) hours as needed.    Historical Provider, MD  valsartan (DIOVAN) 320 MG tablet Take 320 mg by mouth daily.    Historical Provider, MD  venlafaxine XR (EFFEXOR-XR) 75 MG 24 hr capsule Take 75 mg by mouth daily with breakfast.    Historical Provider, MD    Physical Exam: BP 129/84 mmHg  Pulse 93  Temp(Src) 97.9 F (36.6 C) (Oral)  Resp 18  SpO2 94%  GENERAL : Well developed, well nourished, alert and cooperative, and appears to be in mild acute distress. HEAD:  normocephalic. EYES: PERRL, EOMI.  EARS: hearing grossly intact. NOSE: No nasal discharge. THROAT: Oral cavity and pharynx normal.  NECK: Neck supple, non-tender. CARDIAC: Normal S1 and S2. No S3, S4 or murmurs. Rhythm is regular. There is no peripheral edema, cyanosis or pallor.  LUNGS: Clear to auscultation and percussion without rales, rhonchi, wheezing or diminished breath sounds. ABDOMEN: Positive bowel sounds. Soft, nondistended, tender in periumbilical, epigastric and RUQ area. + guarding .No rebound. No masses. EXTREMITIES: No significant deformity or joint abnormality. No edema. NEUROLOGICAL: The mental examination revealed the patient was oriented to person, place, and time.CN II-XII intact. Strength and sensation symmetric and intact throughout.  SKIN: Skin normal color, texture and turgor with no lesions or eruptions. PSYCHIATRIC:  The patient was able to demonstrate good judgement and reason, without hallucinations, abnormal affect or abnormal behaviors during the examination. Patient is not suicidal.          Labs on Admission:  Reviewed.   Radiological Exams on Admission: Dg Abd Acute W/chest  03/07/2015  CLINICAL DATA:  Nausea and vomiting 1 day. Gastric bypass surgery April 2016. EXAM: DG ABDOMEN ACUTE W/ 1V CHEST COMPARISON:  Chest x-ray 10/15/2014 and abdominal films 09/24/2014 FINDINGS: Sternotomy wires are present. Lungs are hypoinflated and otherwise clear. Cardiomediastinal silhouette and remainder of the chest is unchanged. Bowel gas pattern is nonobstructive. There is no free peritoneal air. There are surgical clips over the right upper quadrant and adjacent the left hemidiaphragm. There are mild degenerate changes of the spine and hips. IMPRESSION: No acute cardiopulmonary disease.  Nonobstructive bowel gas pattern. Electronically Signed   By: Quillian Quince  Derrel Nip M.D.   On: 03/07/2015 17:20    EKG:  Independently reviewed. NSR  Assessment/Plan  Vomiting/Nausea:    Likely due to gastritis/esophagitis  No ileus by AXR Will keep NPO, continue IVF, PPI IV BID, Zofran prn.   CAD s/p CABG:  Continue meds once she can tolerate PO meds  HTN: continue meds once she tolerates PO meds. BP stable for now   DVT prophylaxis: [place] enoxaparin GI prophylaxis:PPI IV Code Status: Full Family Communication: Husband at bedside      Gennaro Africa M.D [facility]

## 2015-03-07 NOTE — ED Notes (Signed)
Patient continues to have nausea and vomiting, patient diaphoretic and uncomfortable, EDP is aware

## 2015-03-07 NOTE — ED Notes (Signed)
Patients gown changed and linens changed

## 2015-03-07 NOTE — ED Provider Notes (Signed)
CSN: [id number]     Arrival date & time 03/07/15  5 History   First MD Initiated Contact with Patient 03/07/15 1600     Chief Complaint  Patient presents with  . Nausea  . Emesis     (Consider location/radiation/quality/duration/timing/severity/associated sxs/prior Treatment) HPI Comments: 69 y/o female 6 months out from 4X CABG surgery - had an admission just post op for abdominal pain n/v with constipation - during that admission she spent 3 days in the hospital, gradually improved and was able to be discharged home. She has not had any significant problems since that time. She reports that today she developed acute onset of nausea vomiting and midabdominal pain which occurred after she ate lunch. It is periumbilical but spreads across the right and left abdomen mostly in the lower abdomen. There is associated nausea and vomiting 4 episodes, she does note decreased stool frequency and hard stools. She denies fevers, chills, cough, shortness of breath, back pain, chest pain, diarrhea, rectal bleeding, swelling, numbness, weakness, headaches. She does report having dysuria ever since surgery. She came by paramedic transport, received antiemetics in route with some improvement.  Patient is a 69 y.o. female presenting with vomiting. The history is provided by the patient and medical records.  Emesis   Past Medical History  Diagnosis Date  . CAD (coronary artery disease)   . Myocardial infarct ([place])   . Carpal tunnel syndrome     bilateral  . Hyperglycemia   . Sleep apnea   . Obesity   . Unstable angina ([place])   . Hypertension   . Anxiety   . Hyperlipidemia   . GERD (gastroesophageal reflux disease)   . Anemia   . Coronary artery disease     2x stents, Dr. Clayborn Bigness  . Family history of adverse reaction to anesthesia     "brother; S/P lipotripsy in [place]; transferred to [facility]; had to put him on life support for 2 days"  . PONV (postoperative nausea and vomiting)   .  Anginal pain ([place])   . Myocardial infarct, old 1999    15 years ago  . Sleep apnea     "don't use a mask" (09/13/2014)  . History of blood transfusion 05/2014    "we haven't figured out why I needed it yet" (09/13/2014)  . History of hiatal hernia   . Headache     "weekly" (09/13/2014)  . S/P CABG x 4 09/18/2014    LIMA to LAD, SVG to PDA-dRCA sequentially, SVG to OM, EVH via right thigh   . Dysrhythmia   . Depression    Past Surgical History  Procedure Laterality Date  . Cholecystectomy    . Lumbar spine surgery    . Inguinal hernia repair    . Abdominal hysterectomy    . Removal of ovaries    . Bladder surgery    . Heart stents      1999  . Incontinence surgery    . Cataract extraction w/ intraocular lens  implant, bilateral    . Carpal tunnel release Left   . Back surgery    . Laparoscopic cholecystectomy    . Inguinal hernia repair Right X 3    "last one was in the 1990's; still have hernia there now" (09/13/2014)  . Coronary angioplasty with stent placement  1999    [facility] x2 stent  . Cardiac catheterization  06/2014  . Appendectomy    . Vaginal hysterectomy    . Lumbar disc surgery  1990's?  . Esophagogastroduodenoscopy N/A 09/17/2014    Procedure: ESOPHAGOGASTRODUODENOSCOPY (EGD);  Surgeon: Inda Castle, MD;  Location: [place];  Service: Endoscopy;  Laterality: N/A;  . Coronary artery bypass graft N/A 09/18/2014    Procedure: CORONARY ARTERY BYPASS GRAFTING (CABG)times four using LIMA  to LAD:SVG to  PD and DIST RCA;SVG to OM;, EVH Right thigh;  Surgeon: [name], MD;  Location: [place];  Service: Open Heart Surgery;  Laterality: N/A;  . Tee without cardioversion N/A 09/18/2014    Procedure: TRANSESOPHAGEAL ECHOCARDIOGRAM (TEE);  Surgeon: [name], MD;  Location: [place];  Service: Open Heart Surgery;  Laterality: N/A;   Family History  Problem Relation Age of Onset  . Coronary artery disease Mother   . Osteoporosis Mother   . Hypertension Father   .  Coronary artery disease Father   . Colon cancer Sister   . Stroke Maternal Grandfather   . Heart disease Mother   . Stroke Mother   . Heart attack Mother   . Hypertension Mother   . COPD Father   . Heart disease Father   . Heart attack Father   . Glaucoma Father   . Osteoporosis Father   . Emphysema Father   . Cancer Sister 83    colon  . Hypertension Sister   . Stroke Brother   . Colon cancer Brother   . Hypertension Brother   . Esophageal cancer Neg Hx   . Stomach cancer Neg Hx   . Rectal cancer Neg Hx   . Stroke Maternal Grandmother   . Osteoporosis Maternal Grandmother   . Hypertension Maternal Grandmother   . Hypertension Paternal Grandmother   . Hypertension Paternal Grandfather    Social History  Substance Use Topics  . Smoking status: Never Smoker   . Smokeless tobacco: Never Used  . Alcohol Use: 0.0 oz/week    0 Standard drinks or equivalent per week     Comment: 09/13/2014 "might have a drink a couple times/yr"   OB History    Gravida Para Term Preterm AB TAB SAB Ectopic Multiple Living   0 0 0 0 0 0 0 0       Review of Systems  Gastrointestinal: Positive for vomiting.  All other systems reviewed and are negative.     Allergies  Lipitor  Home Medications   Prior to Admission medications   Medication Sig Start Date End Date Taking? Authorizing Provider  acetaminophen (TYLENOL) 500 MG tablet Take 500 mg by mouth every 6 (six) hours as needed for headache.   Yes Historical Provider, MD  amLODipine (NORVASC) 10 MG tablet Take 1 tablet (10 mg total) by mouth daily. 02/19/15  Yes [name], MD  aspirin 325 MG tablet Take 325 mg by mouth daily.   Yes Historical Provider, MD  aspirin EC 325 MG EC tablet Take 1 tablet (325 mg total) by mouth daily. 09/23/14  Yes Erin R Barrett, PA-C  FLUoxetine (PROZAC) 20 MG capsule Take 2 capsules (40 mg total) by mouth daily. 02/19/15  Yes [name], MD  isosorbide mononitrate (IMDUR) 60 MG 24 hr tablet Take  60 mg by mouth daily.   Yes Historical Provider, MD  metoprolol succinate (TOPROL-XL) 50 MG 24 hr tablet Take 1 tablet (50 mg total) by mouth daily. Take with or immediately following a meal. 02/19/15  Yes [name], MD  omeprazole (PRILOSEC OTC) 20 MG tablet Take 20 mg by mouth daily.   Yes Historical  Provider, MD  pantoprazole (PROTONIX) 40 MG tablet Take 1 tablet (40 mg total) by mouth 2 (two) times daily. 09/11/14  Yes [name], MD  rosuvastatin (CRESTOR) 20 MG tablet Take 40 mg by mouth daily.    Yes Historical Provider, MD  traMADol (ULTRAM) 50 MG tablet TAKE 1 OR 2 TABLETS BY MOUTH EVERY 8 HOURS AS NEEDED. Patient taking differently: TAKE 1 OR 2 TABLETS BY MOUTH EVERY 8 HOURS AS NEEDED FOR PAIN 04/02/14  Yes [name], MD  valsartan (DIOVAN) 320 MG tablet Take 320 mg by mouth daily.   Yes Historical Provider, MD   BP 132/61 mmHg  Pulse 93  Temp(Src) 98.3 F (36.8 C) (Oral)  Resp 15  SpO2 97% Physical Exam  Constitutional: She appears well-developed and well-nourished.  Uncomfortable appearing  HENT:  Head: Normocephalic and atraumatic.  Mouth/Throat: Oropharynx is clear and moist. No oropharyngeal exudate.  Eyes: Conjunctivae and EOM are normal. Pupils are equal, round, and reactive to light. Right eye exhibits no discharge. Left eye exhibits no discharge. No scleral icterus.  Neck: Normal range of motion. Neck supple. No JVD present. No thyromegaly present.  Cardiovascular: Normal rate, regular rhythm, normal heart sounds and intact distal pulses.  Exam reveals no gallop and no friction rub.   No murmur heard. No murmurs, strong pulses at the radial arteries, no JVD  Pulmonary/Chest: Effort normal and breath sounds normal. No respiratory distress. She has no wheezes. She has no rales.  Normal respiratory effort, 100% on room air, speaks in full sentences, no abnormal lung sounds  Abdominal: Soft. Bowel sounds are normal. She exhibits no distension and no mass.  There is tenderness.  Soft abdomen, no upper abdominal tenderness, mid to lower bilateral abdominal tenderness with fullness, no tympanitic sounds to percussion, no peritoneal signs, no guarding  Musculoskeletal: Normal range of motion. She exhibits no edema or tenderness.  Lymphadenopathy:    She has no cervical adenopathy.  Neurological: She is alert. Coordination normal.  Skin: Skin is warm and dry. No rash noted. No erythema.  Psychiatric: She has a normal mood and affect. Her behavior is normal.  Nursing note and vitals reviewed.   ED Course  Procedures (including critical care time) Labs Review Labs Reviewed  COMPREHENSIVE METABOLIC PANEL - Abnormal; Notable for the following:    Glucose, Bld 154 (*)    BUN 23 (*)    Creatinine, Ser 1.05 (*)    Calcium 10.4 (*)    GFR calc non Af Amer 54 (*)    All other components within normal limits  CBC - Abnormal; Notable for the following:    WBC 11.4 (*)    Hemoglobin 15.8 (*)    HCT 46.5 (*)    All other components within normal limits  URINALYSIS, ROUTINE W REFLEX MICROSCOPIC (NOT AT [facility]) - Abnormal; Notable for the following:    Ketones, ur 15 (*)    All other components within normal limits  LIPASE, BLOOD  TROPONIN I  CBC WITH DIFFERENTIAL/PLATELET  COMPREHENSIVE METABOLIC PANEL    Imaging Review Dg Abd Acute W/chest  03/07/2015  CLINICAL DATA:  Nausea and vomiting 1 day. Gastric bypass surgery April 2016. EXAM: DG ABDOMEN ACUTE W/ 1V CHEST COMPARISON:  Chest x-ray 10/15/2014 and abdominal films 09/24/2014 FINDINGS: Sternotomy wires are present. Lungs are hypoinflated and otherwise clear. Cardiomediastinal silhouette and remainder of the chest is unchanged. Bowel gas pattern is nonobstructive. There is no free peritoneal air. There are surgical clips over the  right upper quadrant and adjacent the left hemidiaphragm. There are mild degenerate changes of the spine and hips. IMPRESSION: No acute cardiopulmonary disease.   Nonobstructive bowel gas pattern. Electronically Signed   By: Marin Olp M.D.   On: 03/07/2015 17:20   I have personally reviewed and evaluated these images and lab results as part of my medical decision-making.   EKG Interpretation   Date/Time:  Thursday March 07 2015 15:55:09 EDT Ventricular Rate:  72 PR Interval:  127 QRS Duration: 101 QT Interval:  440 QTC Calculation: 481 R Axis:   29 Text Interpretation:  Sinus rhythm Low voltage, precordial leads  Borderline repolarization abnormality since last tracing no significant  change Confirmed by Sabra Heck  MD, Sharlon Pfohl ([id number]) on 03/07/2015 4:13:57 PM      MDM   Final diagnoses:  Intractable vomiting with nausea, vomiting of unspecified type    The patient has normal vital signs, only mild hypertension, abdominal exam suggest retained stool burden, would consider other sources as well as she is postoperative and has had multiple abdominal surgeries would consider bowel obstruction, less likely to be mesenteric ischemia as the patient is taking aspirin and usually does not have postprandial pain, chronic dysuria will be evaluated with urinalysis, place IV, fluids, antiemetics. The patient is in agreement with the plan  Pt reevaluated - has ongoing n/v - it has been totally uncontrolled despite using zofran, phenergan.    Multiple different medications, multiple IV fluid boluses, no improvement in symptoms, admitted to the hospitalist for symptomatically control, no definite etiology found for the patient's nausea and vomiting.  Noemi Chapel, MD 03/08/15 [phone]

## 2015-03-07 NOTE — ED Notes (Signed)
Patient with sudden onset around 1230 of nausea of vomiting, mild generalized abdominal pain. 20g(L)AC, 4mg  of zofran, vitals stable en route. 12 lead unremarkable.

## 2015-03-08 ENCOUNTER — Ambulatory Visit: Payer: Medicare Other | Admitting: Internal Medicine

## 2015-03-08 DIAGNOSIS — I10 Essential (primary) hypertension: Secondary | ICD-10-CM

## 2015-03-08 DIAGNOSIS — I2510 Atherosclerotic heart disease of native coronary artery without angina pectoris: Secondary | ICD-10-CM

## 2015-03-08 DIAGNOSIS — R1110 Vomiting, unspecified: Secondary | ICD-10-CM | POA: Insufficient documentation

## 2015-03-08 DIAGNOSIS — K2970 Gastritis, unspecified, without bleeding: Principal | ICD-10-CM

## 2015-03-08 DIAGNOSIS — N179 Acute kidney failure, unspecified: Secondary | ICD-10-CM

## 2015-03-08 LAB — COMPREHENSIVE METABOLIC PANEL
ALT: 20 U/L (ref 14–54)
ANION GAP: 11 (ref 5–15)
AST: 33 U/L (ref 15–41)
Albumin: 3.5 g/dL (ref 3.5–5.0)
Alkaline Phosphatase: 49 U/L (ref 38–126)
BUN: 23 mg/dL — ABNORMAL HIGH (ref 6–20)
CO2: 23 mmol/L (ref 22–32)
Calcium: 8.6 mg/dL — ABNORMAL LOW (ref 8.9–10.3)
Chloride: 101 mmol/L (ref 101–111)
Creatinine, Ser: 1.05 mg/dL — ABNORMAL HIGH (ref 0.44–1.00)
GFR calc Af Amer: >60 mL/min (ref >60)
GFR calc non Af Amer: 54 mL/min — ABNORMAL LOW (ref >60)
GLUCOSE: 151 mg/dL — AB (ref 65–99)
POTASSIUM: 3.5 mmol/L (ref 3.5–5.1)
SODIUM: 135 mmol/L (ref 135–145)
Total Bilirubin: 0.6 mg/dL (ref 0.3–1.2)
Total Protein: 6.6 g/dL (ref 6.5–8.1)

## 2015-03-08 LAB — CBC WITH DIFFERENTIAL/PLATELET
BASOS PCT: 0 %
Basophils Absolute: 0 10*3/uL (ref 0.0–0.1)
EOS ABS: 0 10*3/uL (ref 0.0–0.7)
Eosinophils Relative: 0 %
HCT: 42.9 % (ref 36.0–46.0)
HEMOGLOBIN: 14.4 g/dL (ref 12.0–15.0)
Lymphocytes Relative: 6 %
Lymphs Abs: 0.9 10*3/uL (ref 0.7–4.0)
MCH: 31.4 pg (ref 26.0–34.0)
MCHC: 33.6 g/dL (ref 30.0–36.0)
MCV: 93.5 fL (ref 78.0–100.0)
MONO ABS: 0.4 10*3/uL (ref 0.1–1.0)
MONOS PCT: 3 %
NEUTROS PCT: 91 %
Neutro Abs: 13.6 10*3/uL — ABNORMAL HIGH (ref 1.7–7.7)
PLATELETS: 223 10*3/uL (ref 150–400)
RBC: 4.59 MIL/uL (ref 3.87–5.11)
RDW: 13.6 % (ref 11.5–15.5)
WBC: 14.9 10*3/uL — ABNORMAL HIGH (ref 4.0–10.5)

## 2015-03-08 MED ORDER — INFLUENZA VAC SPLIT QUAD 0.5 ML IM SUSY
0.5000 mL | PREFILLED_SYRINGE | INTRAMUSCULAR | Status: AC
  Administered 2015-03-09: 0.5 mL via INTRAMUSCULAR
  Filled 2015-03-08: qty 0.5

## 2015-03-08 MED ORDER — SODIUM CHLORIDE 0.9 % IV SOLN
INTRAVENOUS | Status: DC
  Administered 2015-03-08 (×2): via INTRAVENOUS
  Filled 2015-03-08 (×4): qty 1000

## 2015-03-08 MED ORDER — BUPIVACAINE-EPINEPHRINE (PF) 0.25% -1:200000 IJ SOLN
INTRAMUSCULAR | Status: AC
  Filled 2015-03-08: qty 30

## 2015-03-08 NOTE — Progress Notes (Signed)
Utilization review completed. Jakylah Bassinger, RN, BSN. 

## 2015-03-08 NOTE — Progress Notes (Signed)
Report given to oncoming RN.

## 2015-03-08 NOTE — Progress Notes (Signed)
PROGRESS NOTE  Debbie Delacruz JYN:[id number] DOB: 1946-04-05 DOA: 03/07/2015 PCP: [name], MD  Brief history 69 year old female with a history of CAD status post CABG x 4 on 09/18/2014, hypertension, Barrett's esophagus, anxiety, hyperlipidemia, impaired glucose tolerance presented with one-day history of nausea and vomiting. The patient also had complained of some loose stools, but states that this has improved since admission. She also complained of some epigastric pain.  She denied any fevers, chills, chest pain, shortness breath, syncope. Patient states that she has an episode of nausea and vomiting approximately 3 weeks prior to this admission resulting in syncope. She denies any recent travels or eating any unusual or undercooked foods. She states that 2 of her colleagues at work have also been sick with nausea and vomiting. She has not started on any new medications. Assessment/Plan: Intractable nausea and vomiting -Suspect underlying gastritis and esophagitis  -patient has biopsy-proven Barrett's esophagus, but only taking Prilosec OTC once daily -Restart Protonix 40 mg IV twice a day  -Certainly this may be nonspecific viral illness as she had colleagues with similar illness -Vomiting appears to be improving  -03/07/2015 acute abdominal series negative  Acute kidney injury -Secondary to volume depletion -Continue IV fluids -Baseline creatinine 0.6-0.8 -change fluids to 0.9 NS Hypertension  -Hold amlodipine,valsartan as the patient's blood pressure remained soft  -Restart medications as blood pressure improves  -Continue metoprolol succinate Leukocytosis  -The patient is afebrile and hemodynamically stable  -Urinalysis negative for pyuria  -Chest x-ray negative  -observe off antibiotics CAD with history of CABG  -Continue metoprolol succinate  -Continue imdur  -Continue aspirin  Hyperlipidemia  -Restart Crestor when able to tolerate diet    Depression/anxiety  -Restart Prozac and Effexor when able to tolerate diet   Family Communication:   Husband updated at beside Disposition Plan:   Home 03/09/15 if stable       Procedures/Studies: Dg Abd Acute W/chest  03/07/2015  CLINICAL DATA:  Nausea and vomiting 1 day. Gastric bypass surgery April 2016. EXAM: DG ABDOMEN ACUTE W/ 1V CHEST COMPARISON:  Chest x-ray 10/15/2014 and abdominal films 09/24/2014 FINDINGS: Sternotomy wires are present. Lungs are hypoinflated and otherwise clear. Cardiomediastinal silhouette and remainder of the chest is unchanged. Bowel gas pattern is nonobstructive. There is no free peritoneal air. There are surgical clips over the right upper quadrant and adjacent the left hemidiaphragm. There are mild degenerate changes of the spine and hips. IMPRESSION: No acute cardiopulmonary disease.  Nonobstructive bowel gas pattern. Electronically Signed   By: Marin Olp M.D.   On: 03/07/2015 17:20         Subjective: Patient is feeling better this morning. Vomiting and diarrhea have improved. Abdominal pain is improved. Denies any fevers, chills, chest pain, shortness breath, hematemesis, dysuria, hematuria, hematochezia, melena. No abdominal pain. Denies any headache or rashes.   Objective: Filed Vitals:   03/07/15 2130 03/07/15 2230 03/07/15 2317 03/08/15 0522  BP: 129/84 132/79 132/61 107/50  Pulse: 93 97 93 103  Temp:   98.3 F (36.8 C) 98 F (36.7 C)  TempSrc:   Oral Oral  Resp:   15 14  Height:    5\' 2"  (1.575 m)  Weight:    79.5 kg (175 lb 4.3 oz)  SpO2: 94% 97% 97% 95%    Intake/Output Summary (Last 24 hours) at 03/08/15 1206 Last data filed at 03/08/15 1108  Gross per 24 hour  Intake    500 ml  Output      5 ml  Net    495 ml   Weight change:  Exam:   General:  Pt is alert, follows commands appropriately, not in acute distress  HEENT: No icterus, No thrush, No neck mass, [place]/AT  Cardiovascular: RRR, S1/S2, no rubs, no  gallops  Respiratory: CTA bilaterally, no wheezing, no crackles, no rhonchi  Abdomen: Soft/+BS, non tender, non distended, no guarding  Extremities: No edema, No lymphangitis, No petechiae, No rashes, no synovitis  Data Reviewed: Basic Metabolic Panel:  Recent Labs Lab 03/07/15 1621 03/08/15 0514  NA 138 135  K 4.0 3.5  CL 102 101  CO2 24 23  GLUCOSE 154* 151*  BUN 23* 23*  CREATININE 1.05* 1.05*  CALCIUM 10.4* 8.6*   Liver Function Tests:  Recent Labs Lab 03/07/15 1621 03/08/15 0514  AST 31 33  ALT 22 20  ALKPHOS 58 49  BILITOT 0.8 0.6  PROT 7.8 6.6  ALBUMIN 4.4 3.5    Recent Labs Lab 03/07/15 1621  LIPASE 29   No results for input(s): AMMONIA in the last 168 hours. CBC:  Recent Labs Lab 03/07/15 1621 03/08/15 0514  WBC 11.4* 14.9*  NEUTROABS  --  13.6*  HGB 15.8* 14.4  HCT 46.5* 42.9  MCV 91.9 93.5  PLT 231 223   Cardiac Enzymes:  Recent Labs Lab 03/07/15 1825  TROPONINI 0.03   BNP: Invalid input(s): POCBNP CBG: No results for input(s): GLUCAP in the last 168 hours.  No results found for this or any previous visit (from the past 240 hour(s)).   Scheduled Meds: . aspirin EC  325 mg Oral Daily  . enoxaparin (LOVENOX) injection  40 mg Subcutaneous Daily  . [START ON 03/09/2015] Influenza vac split quadrivalent PF  0.5 mL Intramuscular Tomorrow-1000  . isosorbide mononitrate  60 mg Oral Daily  . metoprolol succinate  50 mg Oral Daily  . pantoprazole (PROTONIX) IV  40 mg Intravenous Q12H   Continuous Infusions: . dextrose 5 % and 0.45% NaCl 125 mL/hr at 03/08/15 0124     Izzah Pasqua, DO  [facility] Pager [phone]  If 7PM-7AM, please contact night-coverage [email or web address] Password [email or web address] 03/08/2015, 12:06 PM   LOS: 1 day

## 2015-03-08 NOTE — Plan of Care (Signed)
Problem: Phase I Progression Outcomes Goal: Initial discharge plan identified Outcome: Completed/Met Date Met:  03/08/15 To return home with husband

## 2015-03-09 DIAGNOSIS — F329 Major depressive disorder, single episode, unspecified: Secondary | ICD-10-CM | POA: Diagnosis not present

## 2015-03-09 DIAGNOSIS — E669 Obesity, unspecified: Secondary | ICD-10-CM | POA: Diagnosis not present

## 2015-03-09 DIAGNOSIS — I10 Essential (primary) hypertension: Secondary | ICD-10-CM | POA: Diagnosis not present

## 2015-03-09 DIAGNOSIS — G4730 Sleep apnea, unspecified: Secondary | ICD-10-CM | POA: Diagnosis not present

## 2015-03-09 DIAGNOSIS — Z951 Presence of aortocoronary bypass graft: Secondary | ICD-10-CM | POA: Diagnosis not present

## 2015-03-09 DIAGNOSIS — Z955 Presence of coronary angioplasty implant and graft: Secondary | ICD-10-CM | POA: Diagnosis not present

## 2015-03-09 DIAGNOSIS — K210 Gastro-esophageal reflux disease with esophagitis: Secondary | ICD-10-CM | POA: Diagnosis not present

## 2015-03-09 DIAGNOSIS — Z888 Allergy status to other drugs, medicaments and biological substances status: Secondary | ICD-10-CM | POA: Diagnosis not present

## 2015-03-09 DIAGNOSIS — E869 Volume depletion, unspecified: Secondary | ICD-10-CM | POA: Diagnosis not present

## 2015-03-09 DIAGNOSIS — K2970 Gastritis, unspecified, without bleeding: Secondary | ICD-10-CM | POA: Diagnosis not present

## 2015-03-09 DIAGNOSIS — Z9884 Bariatric surgery status: Secondary | ICD-10-CM | POA: Diagnosis not present

## 2015-03-09 DIAGNOSIS — K2270 Barrett's esophagus without dysplasia: Secondary | ICD-10-CM | POA: Diagnosis not present

## 2015-03-09 DIAGNOSIS — I252 Old myocardial infarction: Secondary | ICD-10-CM | POA: Diagnosis not present

## 2015-03-09 DIAGNOSIS — F419 Anxiety disorder, unspecified: Secondary | ICD-10-CM | POA: Diagnosis not present

## 2015-03-09 DIAGNOSIS — E785 Hyperlipidemia, unspecified: Secondary | ICD-10-CM | POA: Diagnosis not present

## 2015-03-09 DIAGNOSIS — I25110 Atherosclerotic heart disease of native coronary artery with unstable angina pectoris: Secondary | ICD-10-CM | POA: Diagnosis not present

## 2015-03-09 DIAGNOSIS — N179 Acute kidney failure, unspecified: Secondary | ICD-10-CM | POA: Diagnosis not present

## 2015-03-09 LAB — BASIC METABOLIC PANEL
Anion gap: 7 (ref 5–15)
BUN: 16 mg/dL (ref 6–20)
CO2: 27 mmol/L (ref 22–32)
Calcium: 8.6 mg/dL — ABNORMAL LOW (ref 8.9–10.3)
Chloride: 108 mmol/L (ref 101–111)
Creatinine, Ser: 0.98 mg/dL (ref 0.44–1.00)
GFR calc Af Amer: >60 mL/min (ref >60)
GFR, EST NON AFRICAN AMERICAN: 58 mL/min — AB (ref >60)
GLUCOSE: 97 mg/dL (ref 65–99)
POTASSIUM: 4 mmol/L (ref 3.5–5.1)
Sodium: 142 mmol/L (ref 135–145)

## 2015-03-09 LAB — CBC
HCT: 39.5 % (ref 36.0–46.0)
Hemoglobin: 13.1 g/dL (ref 12.0–15.0)
MCH: 31.6 pg (ref 26.0–34.0)
MCHC: 33.2 g/dL (ref 30.0–36.0)
MCV: 95.4 fL (ref 78.0–100.0)
Platelets: 171 10*3/uL (ref 150–400)
RBC: 4.14 MIL/uL (ref 3.87–5.11)
RDW: 13.7 % (ref 11.5–15.5)
WBC: 7.1 10*3/uL (ref 4.0–10.5)

## 2015-03-09 MED ORDER — PANTOPRAZOLE SODIUM 40 MG PO TBEC: 40.0000 mg | DELAYED_RELEASE_TABLET | Freq: Two times a day (BID) | ORAL | Status: DC

## 2015-03-09 NOTE — Progress Notes (Signed)
Debbie Delacruz to be D/C'd Home per MD order.  Discussed with the patient and all questions fully answered.  VSS, Skin clean, dry and intact without evidence of skin break down, no evidence of skin tears noted. IV catheter discontinued intact. Site without signs and symptoms of complications. Dressing and pressure applied.  An After Visit Summary was printed and given to the patient. Patient received prescription.  D/c education completed with patient/family including follow up instructions, medication list, d/c activities limitations if indicated, with other d/c instructions as indicated by MD - patient able to verbalize understanding, all questions fully answered.   Patient instructed to return to ED, call 911, or call MD for any changes in condition.   Patient escorted via [place], and D/C home via private auto.  Audria Nine F 03/09/2015 10:42 AM

## 2015-03-09 NOTE — Discharge Summary (Signed)
Physician Discharge Summary  Debbie Delacruz [id number] DOB: 1946-03-03 DOA: 03/07/2015  PCP: [name], MD  Admit date: 03/07/2015 Discharge date: 03/09/2015  Recommendations for Outpatient Follow-up:  1. Pt will need to follow up with PCP in 2 weeks post discharge 2. Please obtain BMP in 1-2 weeks  Discharge Diagnoses:  Intractable nausea and vomiting -Suspect underlying gastritis and esophagitis  -patient has biopsy-proven Barrett's esophagus, but only taking Prilosec OTC  -Restart Protonix 40 mg twice a day  -Certainly this may be nonspecific viral illness as she had colleagues with similar illness -Vomiting appears to be improving  -diet advanced which the patient tolerated -03/07/2015 acute abdominal series negative  -pt has not follow up with GI since dx of Barrett's (April 2016)--encouraged her to make appt to see her GI physician soon after d/c Acute kidney injury -Secondary to volume depletion -Continue IV fluids -Baseline creatinine 0.6-0.8 -change fluids to 0.9 NS -serum creatinine 0.9 on the day of d/c Hypertension  -Held amlodipine,valsartan initially as the patient's blood pressure remained soft  -Restart medications as blood pressure improves  -Continue metoprolol succinate -restart amlodipine -do not restart valsartan until pt follows up with PCP for BP check in setting of recovering AKI;  BP acceptable Leukocytosis  -The patient is afebrile and hemodynamically stable  -Urinalysis negative for pyuria  -Chest x-ray negative  -observe off antibiotics--remained clinically stable CAD with history of CABG  -Continue metoprolol succinate  -Continue imdur  -Continue aspirin  -S/p CABG x 4 vessels 09/18/14 Hyperlipidemia  -Restart Crestor  Depression/anxiety  -Restart Prozac and Effexor  Discharge Condition: stable  Disposition: home  Diet:heart healthy Wt Readings from Last 3 Encounters:  03/08/15 79.5 kg (175 lb  4.3 oz)  11/19/14 79.833 kg (176 lb)  09/27/14 80.513 kg (177 lb 8 oz)    History of present illness:  69 year old female with a history of CAD status post CABG x 4 on 09/18/2014, hypertension, Barrett's esophagus, anxiety, hyperlipidemia, impaired glucose tolerance presented with one-day history of nausea and vomiting. The patient also had complained of some loose stools, but states that this has improved since admission. She also complained of some epigastric pain. She denied any fevers, chills, chest pain, shortness breath, syncope. Patient states that she has an episode of nausea and vomiting approximately 3 weeks prior to this admission resulting in syncope. She denies any recent travels or eating any unusual or undercooked foods. She states that 2 of her colleagues at work have also been sick with nausea and vomiting. She has not started on any new medications.  Discharge Exam: Filed Vitals:   03/09/15 0534  BP: 135/68  Pulse: 77  Temp: 98.1 F (36.7 C)  Resp: 18   Filed Vitals:   03/08/15 0522 03/08/15 1454 03/08/15 2112 03/09/15 0534  BP: 107/50 130/68 130/70 135/68  Pulse: 103 91 82 77  Temp: 98 F (36.7 C) 99 F (37.2 C) 98.5 F (36.9 C) 98.1 F (36.7 C)  TempSrc: Oral Oral Oral Oral  Resp: 14 16 18 18   Height: 5\' 2"  (2.800 m)     Weight: 79.5 kg (175 lb 4.3 oz)     SpO2: 95% 89% 97% 98%   General: A&O x 3, NAD, pleasant, cooperative Cardiovascular: RRR, no rub, no gallop, no S3 Respiratory: CTAB, no wheeze, no rhonchi Abdomen:soft, nontender, nondistended, positive bowel sounds Extremities: No edema, No lymphangitis, no petechiae  Discharge Instructions      Discharge Instructions    Diet - low sodium heart healthy  Complete by:  As directed      Discharge instructions    Complete by:  As directed   Do not take valsartan until you follow up with your family doctor and instructed to restart     Increase activity slowly    Complete by:  As directed               Medication List    STOP taking these medications        omeprazole 20 MG tablet  Commonly known as:  PRILOSEC OTC     valsartan 320 MG tablet  Commonly known as:  DIOVAN      TAKE these medications        acetaminophen 500 MG tablet  Commonly known as:  TYLENOL  Take 500 mg by mouth every 6 (six) hours as needed for headache.     amLODipine 10 MG tablet  Commonly known as:  NORVASC  Take 1 tablet (10 mg total) by mouth daily.     aspirin 325 MG tablet  Take 325 mg by mouth daily.     FLUoxetine 20 MG capsule  Commonly known as:  PROZAC  Take 2 capsules (40 mg total) by mouth daily.     isosorbide mononitrate 60 MG 24 hr tablet  Commonly known as:  IMDUR  Take 60 mg by mouth daily.     metoprolol succinate 50 MG 24 hr tablet  Commonly known as:  TOPROL-XL  Take 1 tablet (50 mg total) by mouth daily. Take with or immediately following a meal.     pantoprazole 40 MG tablet  Commonly known as:  PROTONIX  Take 1 tablet (40 mg total) by mouth 2 (two) times daily.     rosuvastatin 20 MG tablet  Commonly known as:  CRESTOR  Take 40 mg by mouth daily.     traMADol 50 MG tablet  Commonly known as:  ULTRAM  TAKE 1 OR 2 TABLETS BY MOUTH EVERY 8 HOURS AS NEEDED.         The results of significant diagnostics from this hospitalization (including imaging, microbiology, ancillary and laboratory) are listed below for reference.    Significant Diagnostic Studies: Dg Abd Acute W/chest  03/07/2015  CLINICAL DATA:  Nausea and vomiting 1 day. Gastric bypass surgery April 2016. EXAM: DG ABDOMEN ACUTE W/ 1V CHEST COMPARISON:  Chest x-ray 10/15/2014 and abdominal films 09/24/2014 FINDINGS: Sternotomy wires are present. Lungs are hypoinflated and otherwise clear. Cardiomediastinal silhouette and remainder of the chest is unchanged. Bowel gas pattern is nonobstructive. There is no free peritoneal air. There are surgical clips over the right upper quadrant and adjacent the left  hemidiaphragm. There are mild degenerate changes of the spine and hips. IMPRESSION: No acute cardiopulmonary disease.  Nonobstructive bowel gas pattern. Electronically Signed   By: Marin Olp M.D.   On: 03/07/2015 17:20     Microbiology: No results found for this or any previous visit (from the past 240 hour(s)).   Labs: Basic Metabolic Panel:  Recent Labs Lab 03/07/15 1621 03/08/15 0514 03/09/15 0500  NA 138 135 142  K 4.0 3.5 4.0  CL 102 101 108  CO2 24 23 27   GLUCOSE 154* 151* 97  BUN 23* 23* 16  CREATININE 1.05* 1.05* 0.98  CALCIUM 10.4* 8.6* 8.6*   Liver Function Tests:  Recent Labs Lab 03/07/15 1621 03/08/15 0514  AST 31 33  ALT 22 20  ALKPHOS 58 49  BILITOT 0.8 0.6  PROT 7.8 6.6  ALBUMIN 4.4 3.5    Recent Labs Lab 03/07/15 1621  LIPASE 29   No results for input(s): AMMONIA in the last 168 hours. CBC:  Recent Labs Lab 03/07/15 1621 03/08/15 0514 03/09/15 0500  WBC 11.4* 14.9* 7.1  NEUTROABS  --  13.6*  --   HGB 15.8* 14.4 13.1  HCT 46.5* 42.9 39.5  MCV 91.9 93.5 95.4  PLT 231 223 171   Cardiac Enzymes:  Recent Labs Lab 03/07/15 1825  TROPONINI 0.03   BNP: Invalid input(s): POCBNP CBG: No results for input(s): GLUCAP in the last 168 hours.  Time coordinating discharge:  Greater than 30 minutes  Signed:  Kathryn Cosby, DO [facility] Pager: [phone] 03/09/2015, 8:04 AM

## 2015-03-11 ENCOUNTER — Telehealth: Payer: Self-pay

## 2015-03-11 NOTE — Telephone Encounter (Signed)
Transition Care Management Follow-up Telephone Call  How have you been since you were released from the hospital? [place], Have some stomach pain still, Admitted because of her Barrett's esophagus, not throwing up, small amount of diarhea.    Do you understand why you were in the hospital? Yes, basically kidney failure and dehydration   Do you understand the discharge instrcutions? Yes, GI for Endoscopy, already has appt with cardiology.   Items Reviewed:  Medications reviewed: No, Protonix started  Allergies reviewed: yes   Dietary changes reviewed:Yes, no peanuts, lettuce  Referrals reviewed: Endoscopy, Already has follow up   Functional Questionnaire:   Activities of Daily Living (ADLs):   She states they are independent in the following: Independent States they require assistance with the following: Independent   Any transportation issues/concerns?: no   Any patient concerns? no  Confirmed importance and date/time of follow-up visits scheduled: yes, scheduled for Tomorrow am at 7am.    Confirmed with patient if condition begins to worsen call PCP or go to the ER.  Patient was given the Call-a-Nurse line [phone]: Yes

## 2015-03-12 ENCOUNTER — Ambulatory Visit: Payer: Self-pay | Admitting: Internal Medicine

## 2015-03-13 ENCOUNTER — Telehealth: Payer: Self-pay | Admitting: Internal Medicine

## 2015-03-13 ENCOUNTER — Ambulatory Visit: Payer: Self-pay | Admitting: Internal Medicine

## 2015-03-13 DIAGNOSIS — Z0289 Encounter for other administrative examinations: Secondary | ICD-10-CM

## 2015-03-13 NOTE — Telephone Encounter (Signed)
FYI, Pt called stating that she cannot make her appt today due to having diarrhea. Appt is still on schedule. Pt did reschedule appt. Thank You!

## 2015-03-27 ENCOUNTER — Encounter: Payer: Self-pay | Admitting: Internal Medicine

## 2015-03-27 ENCOUNTER — Ambulatory Visit: Payer: Self-pay | Admitting: Internal Medicine

## 2015-04-11 ENCOUNTER — Encounter: Payer: Self-pay | Admitting: Internal Medicine

## 2015-04-24 ENCOUNTER — Other Ambulatory Visit: Payer: Self-pay | Admitting: Internal Medicine

## 2015-04-30 ENCOUNTER — Other Ambulatory Visit: Payer: Self-pay | Admitting: Internal Medicine

## 2015-06-24 ENCOUNTER — Other Ambulatory Visit: Payer: Self-pay | Admitting: Internal Medicine

## 2015-07-09 ENCOUNTER — Ambulatory Visit: Payer: Self-pay | Admitting: Internal Medicine

## 2015-07-09 ENCOUNTER — Telehealth: Payer: Self-pay | Admitting: Internal Medicine

## 2015-07-09 NOTE — Telephone Encounter (Signed)
FYI, Pt not able to come in today for her appt, due to being called into work today. Appt is still on schedule. Pt made another appt. Thank you!

## 2015-07-17 ENCOUNTER — Encounter: Payer: Self-pay | Admitting: Internal Medicine

## 2015-07-24 ENCOUNTER — Ambulatory Visit: Payer: Self-pay | Admitting: Internal Medicine

## 2015-08-12 ENCOUNTER — Encounter: Payer: Self-pay | Admitting: Gastroenterology

## 2015-08-30 ENCOUNTER — Telehealth: Payer: Self-pay | Admitting: Internal Medicine

## 2015-08-30 NOTE — Telephone Encounter (Signed)
Done

## 2015-08-30 NOTE — Telephone Encounter (Signed)
Please advise regarding labs and referral. Thanks

## 2015-08-30 NOTE — Telephone Encounter (Signed)
She needs to keep a follow up. She has no-showed or cancelled to last appointments.

## 2015-08-30 NOTE — Telephone Encounter (Signed)
Pt called wanting to get Hep C screening she states she received a letter stating she is due for it. Pt also states she has paper work that needs to be filled out regarding her heart surgery last year regarding pt may have came in contact with some kind of infectious disease. And pt needs to be referred to a infectious disease doctor. Call pt @ [phone] or cell [phone]. Thank you!

## 2015-08-30 NOTE — Telephone Encounter (Signed)
Rasheedah, Can you schedule her for a 30 minute appt to get that all taken care of. Thanks

## 2015-09-03 ENCOUNTER — Ambulatory Visit: Payer: Self-pay | Admitting: Internal Medicine

## 2015-09-12 ENCOUNTER — Telehealth: Payer: Self-pay

## 2015-09-12 NOTE — Telephone Encounter (Signed)
Called pt to change appt time. Pt agreed to change appt time to 10:00am. [name]-PV

## 2015-09-13 ENCOUNTER — Ambulatory Visit: Payer: Medicare Other

## 2015-09-13 VITALS — Ht 62.5 in

## 2015-09-13 DIAGNOSIS — K2270 Barrett's esophagus without dysplasia: Secondary | ICD-10-CM

## 2015-09-13 NOTE — Progress Notes (Signed)
No allergies to eggs or soy No past problems with anesthesia ; no difficulty intubating No diet meds No home oxygen  Has email and internet; refused emmi

## 2015-09-16 ENCOUNTER — Ambulatory Visit: Payer: Medicare Other | Admitting: Thoracic Surgery (Cardiothoracic Vascular Surgery)

## 2015-09-23 ENCOUNTER — Ambulatory Visit: Payer: Medicare Other | Admitting: Thoracic Surgery (Cardiothoracic Vascular Surgery)

## 2015-09-26 ENCOUNTER — Encounter: Payer: Self-pay | Admitting: Gastroenterology

## 2015-09-26 ENCOUNTER — Ambulatory Visit (AMBULATORY_SURGERY_CENTER): Payer: Medicare Other | Admitting: Gastroenterology

## 2015-09-26 VITALS — BP 135/70 | HR 68 | Temp 97.8°F | Resp 15 | Ht 62.0 in | Wt 175.0 lb

## 2015-09-26 DIAGNOSIS — K2270 Barrett's esophagus without dysplasia: Secondary | ICD-10-CM

## 2015-09-26 DIAGNOSIS — K219 Gastro-esophageal reflux disease without esophagitis: Secondary | ICD-10-CM | POA: Diagnosis not present

## 2015-09-26 MED ORDER — PANTOPRAZOLE SODIUM 40 MG PO TBEC: 40.0000 mg | DELAYED_RELEASE_TABLET | Freq: Two times a day (BID) | ORAL | Status: DC

## 2015-09-26 MED ORDER — SODIUM CHLORIDE 0.9 % IV SOLN: 500.0000 mL | INTRAVENOUS | Status: DC

## 2015-09-26 NOTE — Progress Notes (Signed)
Report to PACU, RN, vss, BBS= Clear.  

## 2015-09-26 NOTE — Op Note (Signed)
[facility] Patient Name: Debbie Delacruz Procedure Date: 09/26/2015 9:50 AM MRN: [id number] Endoscopist: Ladene Artist , MD Age: 70 Date of Birth: 1945/09/25 Gender: Female Procedure:                Upper GI endoscopy Indications:              Follow-up of Barrett's esophagus Medicines:                Monitored Anesthesia Care Procedure:                Pre-Anesthesia Assessment:                           - Prior to the procedure, a History and Physical                            was performed, and patient medications and                            allergies were reviewed. The patient's tolerance of                            previous anesthesia was also reviewed. The risks                            and benefits of the procedure and the sedation                            options and risks were discussed with the patient.                            All questions were answered, and informed consent                            was obtained. Prior Anticoagulants: The patient has                            taken no previous anticoagulant or antiplatelet                            agents. ASA Grade Assessment: II - A patient with                            mild systemic disease. After reviewing the risks                            and benefits, the patient was deemed in                            satisfactory condition to undergo the procedure.                           After obtaining informed consent, the endoscope was  passed under direct vision. Throughout the                            procedure, the patient's blood pressure, pulse, and                            oxygen saturations were monitored continuously. The                            Model GIF-HQ190 [id number]) scope was introduced                            through the mouth, and advanced to the second part                            of duodenum. The upper GI endoscopy was            accomplished without difficulty. The patient                            tolerated the procedure well. Scope In: Scope Out: Findings:                 There were esophageal mucosal changes secondary to                            established long-segment Barrett's disease present                            in the distal esophagus. The maximum longitudinal                            extent of these mucosal changes was 7 cm in length.                            Mucosa was biopsied with a cold forceps for                            histology in 4 quadrants at intervals of 1.5 cm.                           The exam of the esophagus was otherwise normal.                           A small hiatal hernia was present on retroflexed                            view.                           The exam of the stomach was otherwise normal.                           The duodenal bulb and second portion of the  duodenum were normal. Complications:            No immediate complications. Estimated Blood Loss:     Estimated blood loss: none. Impression:               - Esophageal mucosal changes secondary to                            established long-segment Barrett's disease.                            Biopsied.                           - Small hiatal hernia. Recommendation:           - Patient has a contact number available for                            emergencies. The signs and symptoms of potential                            delayed complications were discussed with the                            patient. Return to normal activities tomorrow.                            Written discharge instructions were provided to the                            patient.                           - Resume previous diet.                           - Continue present medications.                           - Await pathology results.                           - Repeat upper endoscopy  in 2 years for                            surveillance pending pathology review. Ladene Artist, MD 09/26/2015 10:12:29 AM This report has been signed electronically.

## 2015-09-26 NOTE — Patient Instructions (Signed)
YOU HAD AN ENDOSCOPIC PROCEDURE TODAY AT [facility]:   Refer to the procedure report that was given to you for any specific questions about what was found during the examination.  If the procedure report does not answer your questions, please call your gastroenterologist to clarify.  If you requested that your care partner not be given the details of your procedure findings, then the procedure report has been included in a sealed envelope for you to review at your convenience later.  YOU SHOULD EXPECT: Some feelings of bloating in the abdomen. Passage of more gas than usual.  Walking can help get rid of the air that was put into your GI tract during the procedure and reduce the bloating.   Please Note:  You might notice some irritation and congestion in your nose or some drainage.  This is from the oxygen used during your procedure.  There is no need for concern and it should clear up in a day or so.  SYMPTOMS TO REPORT IMMEDIATELY:   Following upper endoscopy (EGD)  Vomiting of blood or coffee ground material  New chest pain or pain under the shoulder blades  Painful or persistently difficult swallowing  New shortness of breath  Fever of 100F or higher  Black, tarry-looking stools  For urgent or emergent issues, a gastroenterologist can be reached at any hour by calling [phone].   DIET: Your first meal following the procedure should be a small meal and then it is ok to progress to your normal diet. Heavy or fried foods are harder to digest and may make you feel nauseous or bloated.  Likewise, meals heavy in dairy and vegetables can increase bloating.  Drink plenty of fluids but you should avoid alcoholic beverages for 24 hours.  ACTIVITY:  You should plan to take it easy for the rest of today and you should NOT DRIVE or use heavy machinery until tomorrow (because of the sedation medicines used during the test).    FOLLOW UP: Our staff will call the number listed on  your records the next business day following your procedure to check on you and address any questions or concerns that you may have regarding the information given to you following your procedure. If we do not reach you, we will leave a message.  However, if you are feeling well and you are not experiencing any problems, there is no need to return our call.  We will assume that you have returned to your regular daily activities without incident.  If any biopsies were taken you will be contacted by phone or by letter within the next 1-3 weeks.  Please call us at [phone] if you have not heard about the biopsies in 3 weeks.    SIGNATURES/CONFIDENTIALITY: You and/or your care partner have signed paperwork which will be entered into your electronic medical record.  These signatures attest to the fact that that the information above on your After Visit Summary has been reviewed and is understood.  Full responsibility of the confidentiality of this discharge information lies with you and/or your care-partner.  Your prescription was sent to [facility]  Please read over handouts about Barretts and Hiatal hernia  Please continue your normal medications and diet

## 2015-09-26 NOTE — Progress Notes (Signed)
Called to room to assist during endoscopic procedure.  Patient ID and intended procedure confirmed with present staff. Received instructions for my participation in the procedure from the performing physician.  

## 2015-09-27 ENCOUNTER — Telehealth: Payer: Self-pay | Admitting: *Deleted

## 2015-09-27 NOTE — Telephone Encounter (Signed)
  Follow up Call-  Call back number 09/26/2015 01/13/2013  Post procedure Call Back phone  # [phone] cell [phone] or [phone]  Permission to leave phone message Yes Yes     Patient questions:  Do you have a fever, pain , or abdominal swelling? No. Pain Score  0 *  Have you tolerated food without any problems? Yes.    Have you been able to return to your normal activities? Yes.    Do you have any questions about your discharge instructions: Diet   No. Medications  No. Follow up visit  No.  Do you have questions or concerns about your Care? No.  Actions: * If pain score is 4 or above: No action needed, pain <4.

## 2015-09-28 DIAGNOSIS — H524 Presbyopia: Secondary | ICD-10-CM | POA: Diagnosis not present

## 2015-10-02 ENCOUNTER — Encounter: Payer: Self-pay | Admitting: Gastroenterology

## 2015-10-04 ENCOUNTER — Ambulatory Visit: Payer: Self-pay | Admitting: Internal Medicine

## 2015-10-09 ENCOUNTER — Encounter: Payer: Self-pay | Admitting: Internal Medicine

## 2015-10-09 ENCOUNTER — Telehealth: Payer: Self-pay

## 2015-10-09 ENCOUNTER — Telehealth: Payer: Self-pay | Admitting: Internal Medicine

## 2015-10-09 ENCOUNTER — Ambulatory Visit (INDEPENDENT_AMBULATORY_CARE_PROVIDER_SITE_OTHER): Payer: Medicare Other | Admitting: Internal Medicine

## 2015-10-09 VITALS — BP 160/90 | HR 70 | Ht 62.0 in | Wt 183.0 lb

## 2015-10-09 DIAGNOSIS — Z20818 Contact with and (suspected) exposure to other bacterial communicable diseases: Secondary | ICD-10-CM

## 2015-10-09 DIAGNOSIS — R112 Nausea with vomiting, unspecified: Secondary | ICD-10-CM | POA: Diagnosis not present

## 2015-10-09 DIAGNOSIS — I10 Essential (primary) hypertension: Secondary | ICD-10-CM

## 2015-10-09 DIAGNOSIS — E785 Hyperlipidemia, unspecified: Secondary | ICD-10-CM

## 2015-10-09 DIAGNOSIS — F329 Major depressive disorder, single episode, unspecified: Secondary | ICD-10-CM | POA: Insufficient documentation

## 2015-10-09 DIAGNOSIS — F419 Anxiety disorder, unspecified: Secondary | ICD-10-CM

## 2015-10-09 DIAGNOSIS — F32A Depression, unspecified: Secondary | ICD-10-CM

## 2015-10-09 DIAGNOSIS — Z1159 Encounter for screening for other viral diseases: Secondary | ICD-10-CM

## 2015-10-09 LAB — COMPREHENSIVE METABOLIC PANEL
ALBUMIN: 4.4 g/dL (ref 3.5–5.2)
ALT: 12 U/L (ref 0–35)
AST: 17 U/L (ref 0–37)
Alkaline Phosphatase: 55 U/L (ref 39–117)
BILIRUBIN TOTAL: 0.4 mg/dL (ref 0.2–1.2)
BUN: 20 mg/dL (ref 6–23)
CALCIUM: 9.7 mg/dL (ref 8.4–10.5)
CO2: 28 mEq/L (ref 19–32)
CREATININE: 0.94 mg/dL (ref 0.40–1.20)
Chloride: 101 mEq/L (ref 96–112)
GFR: 62.96 mL/min (ref >60.00)
Glucose, Bld: 112 mg/dL — ABNORMAL HIGH (ref 70–99)
Potassium: 4.9 mEq/L (ref 3.5–5.1)
Sodium: 137 mEq/L (ref 135–145)
Total Protein: 7.4 g/dL (ref 6.0–8.3)

## 2015-10-09 LAB — CBC WITH DIFFERENTIAL/PLATELET
BASOS ABS: 0 10*3/uL (ref 0.0–0.1)
BASOS PCT: 0.6 % (ref 0.0–3.0)
EOS ABS: 0.3 10*3/uL (ref 0.0–0.7)
Eosinophils Relative: 4.8 % (ref 0.0–5.0)
HEMATOCRIT: 38 % (ref 36.0–46.0)
HEMOGLOBIN: 12.4 g/dL (ref 12.0–15.0)
LYMPHS PCT: 23 % (ref 12.0–46.0)
Lymphs Abs: 1.3 10*3/uL (ref 0.7–4.0)
MCHC: 32.5 g/dL (ref 30.0–36.0)
MCV: 80.4 fl (ref 78.0–100.0)
Monocytes Absolute: 0.7 10*3/uL (ref 0.1–1.0)
Monocytes Relative: 13.1 % — ABNORMAL HIGH (ref 3.0–12.0)
Neutro Abs: 3.3 10*3/uL (ref 1.4–7.7)
Neutrophils Relative %: 58.5 % (ref 43.0–77.0)
Platelets: 296 10*3/uL (ref 150.0–400.0)
RBC: 4.73 Mil/uL (ref 3.87–5.11)
RDW: 15.3 % (ref 11.5–15.5)
WBC: 5.6 10*3/uL (ref 4.0–10.5)

## 2015-10-09 LAB — LIPID PANEL
Cholesterol: 307 mg/dL — ABNORMAL HIGH (ref 0–200)
HDL: 37.3 mg/dL — AB (ref >39.00)
NonHDL: 269.62
TRIGLYCERIDES: 231 mg/dL — AB (ref 0.0–149.0)
Total CHOL/HDL Ratio: 8
VLDL: 46.2 mg/dL — AB (ref 0.0–40.0)

## 2015-10-09 LAB — LIPASE: Lipase: 11 U/L (ref 11.0–59.0)

## 2015-10-09 LAB — LDL CHOLESTEROL, DIRECT: Direct LDL: 222 mg/dL

## 2015-10-09 MED ORDER — SERTRALINE HCL 50 MG PO TABS: 50.0000 mg | ORAL_TABLET | Freq: Every day | ORAL | Status: DC

## 2015-10-09 NOTE — Assessment & Plan Note (Signed)
Reviewed letter she received from [facility] regarding potential exposure to Mycobacterium during her cardiac surgery. She has not had fever, chills, but does have sweats at night. Will set up ID evaluation. Question if any additional testing is necessary. CBC sent today.

## 2015-10-09 NOTE — Telephone Encounter (Signed)
I would recommend that we consider evaluation in the cardiology lipid clinic if she is taking Crestor 20mg  daily consistently. Her cholesterol is extremely high. If she is willing, I will place an order for referral.

## 2015-10-09 NOTE — Assessment & Plan Note (Signed)
Worsening depression/anxiety related to work stressors. Will stop Fluoxetine and start Sertraline at 50mg  daily. Follow up in 4 weeks. We also discussed counseling, but will hold off for now.

## 2015-10-09 NOTE — Telephone Encounter (Signed)
-----   Message from [name], MD sent at 10/09/2015 11:21 AM EDT ----- Please make sure pt gets this message.  Labs are normal except for cholesterol which is extremely high, in the range of inherited high cholesterol.  I see that you are not able to tolerate Atorvastatin. Have you tried Crestor in the past?

## 2015-10-09 NOTE — Assessment & Plan Note (Signed)
Intermittent nausea and vomiting. No persistent abdominal pain. Recent EGD normal. Exam today normal. No clear trigger. Will check CBC, CMP, lipase. Consider referral back to GI if symptoms persistent.

## 2015-10-09 NOTE — Progress Notes (Signed)
Subjective:    Patient ID: Debbie Delacruz, female    DOB: 1946-01-29, 70 y.o.   MRN: [id number]  HPI  70YO female presents for follow up.  Last seen over a year ago.  Recently received a letter from [facility] regarding potential exposure to Mycobacterium during her heart surgery. Recommended ID evaluation. She has not had fever, but has has had sweats at night and chronic NV.  Frequent, intermittent nausea and vomiting. No bloody emesis. No chronic abdominal pain. No clear food trigger. Recent endoscopy showed no clear cause of symptoms. Followed by Dr. Fuller Plan in GI. Taking Pantoprazole, and symptoms are worse if she misses a dose.  Depression - Feels that Prozac is no longer effective. Stressful time at work.  HTN - BP well controlled at home. Did not take meds today. No recent headache, chest pain or palpitations.   Wt Readings from Last 3 Encounters:  10/09/15 183 lb (83.008 kg)  09/26/15 175 lb (79.379 kg)  03/08/15 175 lb 4.3 oz (79.5 kg)   BP Readings from Last 3 Encounters:  10/09/15 160/90  09/26/15 135/70  03/09/15 135/68    Past Medical History  Diagnosis Date  . CAD (coronary artery disease)   . Myocardial infarct ([place])   . Carpal tunnel syndrome     bilateral  . Hyperglycemia   . Sleep apnea   . Obesity   . Unstable angina ([place])   . Hypertension   . Anxiety   . Hyperlipidemia   . GERD (gastroesophageal reflux disease)   . Anemia   . Coronary artery disease     2x stents, Dr. Clayborn Bigness  . Family history of adverse reaction to anesthesia     "brother; S/P lipotripsy in [place]; transferred to [facility]; had to put him on life support for 2 days"  . PONV (postoperative nausea and vomiting)   . Anginal pain ([place])   . Myocardial infarct, old 1999    15 years ago  . Sleep apnea     "don't use a mask" (09/13/2014)  . History of blood transfusion 05/2014    "we haven't figured out why I needed it yet" (09/13/2014)  . History of hiatal hernia   .  Headache     "weekly" (09/13/2014)  . S/P CABG x 4 09/18/2014    LIMA to LAD, SVG to PDA-dRCA sequentially, SVG to OM, EVH via right thigh   . Dysrhythmia   . Depression   . Arthritis   . Cataract     bil removed cataracts   Family History  Problem Relation Age of Onset  . Coronary artery disease Mother   . Osteoporosis Mother   . Heart disease Mother   . Stroke Mother   . Heart attack Mother   . Hypertension Mother   . Hypertension Father   . Coronary artery disease Father   . COPD Father   . Heart disease Father   . Heart attack Father   . Glaucoma Father   . Osteoporosis Father   . Emphysema Father   . Stroke Maternal Grandfather   . Cancer Sister 77    colon  . Hypertension Sister   . Colon cancer Sister     dx in her 41's  . Stroke Brother   . Hypertension Brother   . Esophageal cancer Neg Hx   . Stomach cancer Neg Hx   . Rectal cancer Neg Hx   . Pancreatic cancer Neg Hx   . Stroke Maternal Grandmother   .  Osteoporosis Maternal Grandmother   . Hypertension Maternal Grandmother   . Hypertension Paternal Grandmother   . Hypertension Paternal Grandfather    Past Surgical History  Procedure Laterality Date  . Cholecystectomy    . Lumbar spine surgery    . Inguinal hernia repair    . Abdominal hysterectomy    . Removal of ovaries    . Bladder surgery    . Heart stents      1999  . Incontinence surgery    . Cataract extraction w/ intraocular lens  implant, bilateral    . Carpal tunnel release Left   . Back surgery    . Laparoscopic cholecystectomy    . Inguinal hernia repair Right X 3    "last one was in the 1990's; still have hernia there now" (09/13/2014)  . Coronary angioplasty with stent placement  1999    [facility] x2 stent  . Cardiac catheterization  06/2014  . Appendectomy    . Vaginal hysterectomy    . Lumbar disc surgery  1990's?  . Esophagogastroduodenoscopy N/A 09/17/2014    Procedure: ESOPHAGOGASTRODUODENOSCOPY (EGD);  Surgeon: Inda Castle, MD;   Location: [place];  Service: Endoscopy;  Laterality: N/A;  . Coronary artery bypass graft N/A 09/18/2014    Procedure: CORONARY ARTERY BYPASS GRAFTING (CABG)times four using LIMA  to LAD:SVG to  PD and DIST RCA;SVG to OM;, EVH Right thigh;  Surgeon: [name], MD;  Location: [place];  Service: Open Heart Surgery;  Laterality: N/A;  . Tee without cardioversion N/A 09/18/2014    Procedure: TRANSESOPHAGEAL ECHOCARDIOGRAM (TEE);  Surgeon: [name], MD;  Location: [place];  Service: Open Heart Surgery;  Laterality: N/A;  . Upper gastrointestinal endoscopy    . Colonoscopy     Social History   Social History  . Marital Status: Married    Spouse Name: N/A  . Number of Children: N/A  . Years of Education: N/A   Social History Main Topics  . Smoking status: Never Smoker   . Smokeless tobacco: Never Used  . Alcohol Use: 0.0 oz/week    0 Standard drinks or equivalent per week     Comment: 09/13/2014 "might have a drink a couple times/yr"  . Drug Use: No  . Sexual Activity: Not Currently   Other Topics Concern  . None   Social History Narrative   ** Merged History Encounter **       Lives in [place] with 47 YO nephew and husband. 3 dogs outside.  Work - Doctor, general practice, [employer]  Diet - regular, limited meat  Exercise - walks some    Review of Systems  Constitutional: Negative for fever, chills, appetite change, fatigue and unexpected weight change.  Eyes: Negative for visual disturbance.  Respiratory: Negative for cough and shortness of breath.   Cardiovascular: Negative for chest pain, palpitations and leg swelling.  Gastrointestinal: Positive for nausea and vomiting. Negative for abdominal pain, diarrhea and constipation.  Musculoskeletal: Negative for myalgias and arthralgias.  Skin: Negative for color change and rash.  Hematological: Negative for adenopathy. Does not bruise/bleed easily.  Psychiatric/Behavioral: Positive for dysphoric mood. Negative for  suicidal ideas and sleep disturbance. The patient is nervous/anxious.        Objective:    BP 160/90 mmHg  Pulse 70  Ht 5\' 2"  (1.575 m)  Wt 183 lb (83.008 kg)  BMI 33.46 kg/m2  SpO2 98% Physical Exam  Constitutional: She is oriented to person, place, and time. She appears well-developed  and well-nourished. No distress.  HENT:  Head: Normocephalic and atraumatic.  Right Ear: External ear normal.  Left Ear: External ear normal.  Nose: Nose normal.  Mouth/Throat: Oropharynx is clear and moist. No oropharyngeal exudate.  Eyes: Conjunctivae are normal. Pupils are equal, round, and reactive to light. Right eye exhibits no discharge. Left eye exhibits no discharge. No scleral icterus.  Neck: Normal range of motion. Neck supple. No tracheal deviation present. No thyromegaly present.  Cardiovascular: Normal rate, regular rhythm, normal heart sounds and intact distal pulses.  Exam reveals no gallop and no friction rub.   No murmur heard. Pulmonary/Chest: Effort normal and breath sounds normal. No respiratory distress. She has no wheezes. She has no rales. She exhibits no tenderness.  Musculoskeletal: Normal range of motion. She exhibits no edema or tenderness.  Lymphadenopathy:    She has no cervical adenopathy.  Neurological: She is alert and oriented to person, place, and time. No cranial nerve deficit. She exhibits normal muscle tone. Coordination normal.  Skin: Skin is warm and dry. No rash noted. She is not diaphoretic. No erythema. No pallor.  Psychiatric: Her speech is normal and behavior is normal. Judgment and thought content normal. Her mood appears anxious. She exhibits a depressed mood. She expresses no suicidal ideation.          Assessment & Plan:   Problem List Items Addressed This Visit      Unprioritized   Contact with and (suspected) exposure to other bacterial communicable diseases    Reviewed letter she received from [facility] regarding potential exposure to  Mycobacterium during her cardiac surgery. She has not had fever, chills, but does have sweats at night. Will set up ID evaluation. Question if any additional testing is necessary. CBC sent today.      Relevant Orders   Ambulatory referral to Infectious Disease   CBC with Differential/Platelet   Depression    Worsening depression/anxiety related to work stressors. Will stop Fluoxetine and start Sertraline at 50mg  daily. Follow up in 4 weeks. We also discussed counseling, but will hold off for now.      Relevant Medications   sertraline (ZOLOFT) 50 MG tablet   Dyslipidemia   Relevant Orders   Lipid panel   Essential hypertension, benign - Primary    BP Readings from Last 3 Encounters:  10/09/15 160/90  09/26/15 135/70  03/09/15 135/68   BP elevated today, but she has not taken her medication. Will monitor for now. Continue Metoprolol, Imdur and Norvasc. Follow up in 4 weeks.      Relevant Orders   Comprehensive metabolic panel   Nausea with vomiting    Intermittent nausea and vomiting. No persistent abdominal pain. Recent EGD normal. Exam today normal. No clear trigger. Will check CBC, CMP, lipase. Consider referral back to GI if symptoms persistent.      Relevant Orders   Lipase   Need for hepatitis C screening test   Relevant Orders   Hepatitis C antibody       Return in about 4 weeks (around 11/06/2015) for Recheck.  Ronette Deter, MD Internal Medicine [facility]

## 2015-10-09 NOTE — Progress Notes (Signed)
Pre visit review using our clinic review tool, if applicable. No additional management support is needed unless otherwise documented below in the visit note. 

## 2015-10-09 NOTE — Telephone Encounter (Signed)
Patient informed of lab results and cholesterol.  She is currently taking crestor.

## 2015-10-09 NOTE — Patient Instructions (Addendum)
Stop Fluoxetine.  Start Sertraline 50mg  daily at bedtime.  We will set up referral to infectious disease.  Labs today.

## 2015-10-09 NOTE — Telephone Encounter (Signed)
Pt returned call. Please call at [phone].

## 2015-10-09 NOTE — Assessment & Plan Note (Signed)
BP Readings from Last 3 Encounters:  10/09/15 160/90  09/26/15 135/70  03/09/15 135/68   BP elevated today, but she has not taken her medication. Will monitor for now. Continue Metoprolol, Imdur and Norvasc. Follow up in 4 weeks.

## 2015-10-09 NOTE — Telephone Encounter (Signed)
Left message for patient to call office to discuss considering cardiology lipid clinic.

## 2015-10-10 LAB — HEPATITIS C ANTIBODY: HCV Ab: NEGATIVE

## 2015-10-10 NOTE — Telephone Encounter (Signed)
Patient is willing to try the cardiology lipid clinic.  Please order.

## 2015-10-14 ENCOUNTER — Telehealth: Payer: Self-pay | Admitting: *Deleted

## 2015-10-14 ENCOUNTER — Encounter: Payer: Medicare Other | Admitting: Thoracic Surgery (Cardiothoracic Vascular Surgery)

## 2015-10-15 ENCOUNTER — Encounter: Payer: Self-pay | Admitting: Thoracic Surgery (Cardiothoracic Vascular Surgery)

## 2015-10-15 ENCOUNTER — Encounter: Payer: Self-pay | Admitting: Internal Medicine

## 2015-10-15 ENCOUNTER — Encounter: Payer: Self-pay | Admitting: Pharmacist

## 2015-10-15 ENCOUNTER — Other Ambulatory Visit: Payer: Self-pay | Admitting: Internal Medicine

## 2015-10-15 NOTE — Progress Notes (Signed)
This encounter was created in error - please disregard.

## 2015-10-16 ENCOUNTER — Encounter: Payer: Self-pay | Admitting: Internal Medicine

## 2015-10-16 ENCOUNTER — Other Ambulatory Visit: Payer: Self-pay | Admitting: *Deleted

## 2015-10-16 MED ORDER — ROSUVASTATIN CALCIUM 10 MG PO TABS: 10.0000 mg | ORAL_TABLET | Freq: Every day | ORAL | Status: DC

## 2015-10-17 ENCOUNTER — Ambulatory Visit: Payer: Medicare Other | Admitting: Pharmacist

## 2015-10-19 ENCOUNTER — Encounter: Payer: Self-pay | Admitting: Internal Medicine

## 2015-10-24 ENCOUNTER — Encounter: Payer: Self-pay | Admitting: Internal Medicine

## 2015-11-04 ENCOUNTER — Ambulatory Visit: Payer: Medicare Other | Admitting: Thoracic Surgery (Cardiothoracic Vascular Surgery)

## 2015-11-11 ENCOUNTER — Ambulatory Visit: Payer: Medicare Other | Admitting: Thoracic Surgery (Cardiothoracic Vascular Surgery)

## 2015-11-15 ENCOUNTER — Encounter: Payer: Self-pay | Admitting: Thoracic Surgery (Cardiothoracic Vascular Surgery)

## 2015-11-18 ENCOUNTER — Encounter: Payer: Self-pay | Admitting: Internal Medicine

## 2015-11-21 ENCOUNTER — Encounter: Payer: Self-pay | Admitting: Internal Medicine

## 2015-11-22 ENCOUNTER — Encounter: Payer: Self-pay | Admitting: Internal Medicine

## 2015-11-22 ENCOUNTER — Ambulatory Visit (INDEPENDENT_AMBULATORY_CARE_PROVIDER_SITE_OTHER): Payer: Medicare Other | Admitting: Internal Medicine

## 2015-11-22 VITALS — BP 138/84 | HR 77 | Ht 62.0 in | Wt 187.6 lb

## 2015-11-22 DIAGNOSIS — N63 Unspecified lump in unspecified breast: Secondary | ICD-10-CM | POA: Insufficient documentation

## 2015-11-22 DIAGNOSIS — F329 Major depressive disorder, single episode, unspecified: Secondary | ICD-10-CM | POA: Diagnosis not present

## 2015-11-22 DIAGNOSIS — F32A Depression, unspecified: Secondary | ICD-10-CM

## 2015-11-22 NOTE — Patient Instructions (Signed)
We will set up evaluation with mammogram and ultrasound.  Follow up 2 weeks.

## 2015-11-22 NOTE — Assessment & Plan Note (Signed)
Symptoms of depression improved with addition of Sertraline. Will continue to monitor.

## 2015-11-22 NOTE — Assessment & Plan Note (Signed)
Left breast lump. Last mammogram 2014 BIRADS1. Strong family hx of breast cancer. Will get diagnostic mammogram and [place]. Follow up in 2 weeks.

## 2015-11-22 NOTE — Progress Notes (Signed)
Pre visit review using our clinic review tool, if applicable. No additional management support is needed unless otherwise documented below in the visit note. 

## 2015-11-22 NOTE — Progress Notes (Signed)
Subjective:    Patient ID: Debbie Delacruz, female    DOB: 1946/04/11, 70 y.o.   MRN: [id number]  HPI  70YO female presents for follow up.  Recently seen for worsening depression. Started on Sertraline. She reports symptoms of depression have improved.No side effects noted.  Breast lump - Left breast lump. Sore to touch. Noted a few weeks ago.  Last mammogram 2014, BIRADS 1. Strong family history of breast cancer.     Wt Readings from Last 3 Encounters:  11/22/15 187 lb 9.6 oz (85.095 kg)  10/09/15 183 lb (83.008 kg)  09/26/15 175 lb (79.379 kg)   BP Readings from Last 3 Encounters:  11/22/15 138/84  10/09/15 160/90  09/26/15 135/70    Past Medical History  Diagnosis Date  . CAD (coronary artery disease)   . Myocardial infarct ([place])   . Carpal tunnel syndrome     bilateral  . Hyperglycemia   . Sleep apnea   . Obesity   . Unstable angina ([place])   . Hypertension   . Anxiety   . Hyperlipidemia   . GERD (gastroesophageal reflux disease)   . Anemia   . Coronary artery disease     2x stents, Dr. Clayborn Bigness  . Family history of adverse reaction to anesthesia     "brother; S/P lipotripsy in [place]; transferred to [facility]; had to put him on life support for 2 days"  . PONV (postoperative nausea and vomiting)   . Anginal pain ([place])   . Myocardial infarct, old 1999    15 years ago  . Sleep apnea     "don't use a mask" (09/13/2014)  . History of blood transfusion 05/2014    "we haven't figured out why I needed it yet" (09/13/2014)  . History of hiatal hernia   . Headache     "weekly" (09/13/2014)  . S/P CABG x 4 09/18/2014    LIMA to LAD, SVG to PDA-dRCA sequentially, SVG to OM, EVH via right thigh   . Dysrhythmia   . Depression   . Arthritis   . Cataract     bil removed cataracts   Family History  Problem Relation Age of Onset  . Coronary artery disease Mother   . Osteoporosis Mother   . Heart disease Mother   . Stroke Mother   . Heart attack  Mother   . Hypertension Mother   . Hypertension Father   . Coronary artery disease Father   . COPD Father   . Heart disease Father   . Heart attack Father   . Glaucoma Father   . Osteoporosis Father   . Emphysema Father   . Stroke Maternal Grandfather   . Cancer Sister 91    colon  . Hypertension Sister   . Colon cancer Sister     dx in her 41's  . Stroke Brother   . Hypertension Brother   . Esophageal cancer Neg Hx   . Stomach cancer Neg Hx   . Rectal cancer Neg Hx   . Pancreatic cancer Neg Hx   . Stroke Maternal Grandmother   . Osteoporosis Maternal Grandmother   . Hypertension Maternal Grandmother   . Hypertension Paternal Grandmother   . Hypertension Paternal Grandfather    Past Surgical History  Procedure Laterality Date  . Cholecystectomy    . Lumbar spine surgery    . Inguinal hernia repair    . Abdominal hysterectomy    . Removal of ovaries    . Bladder  surgery    . Heart stents      1999  . Incontinence surgery    . Cataract extraction w/ intraocular lens  implant, bilateral    . Carpal tunnel release Left   . Back surgery    . Laparoscopic cholecystectomy    . Inguinal hernia repair Right X 3    "last one was in the 1990's; still have hernia there now" (09/13/2014)  . Coronary angioplasty with stent placement  1999    [facility] x2 stent  . Cardiac catheterization  06/2014  . Appendectomy    . Vaginal hysterectomy    . Lumbar disc surgery  1990's?  . Esophagogastroduodenoscopy N/A 09/17/2014    Procedure: ESOPHAGOGASTRODUODENOSCOPY (EGD);  Surgeon: Inda Castle, MD;  Location: [place];  Service: Endoscopy;  Laterality: N/A;  . Coronary artery bypass graft N/A 09/18/2014    Procedure: CORONARY ARTERY BYPASS GRAFTING (CABG)times four using LIMA  to LAD:SVG to  PD and DIST RCA;SVG to OM;, EVH Right thigh;  Surgeon: [name], MD;  Location: [place];  Service: Open Heart Surgery;  Laterality: N/A;  . Tee without cardioversion N/A 09/18/2014    Procedure:  TRANSESOPHAGEAL ECHOCARDIOGRAM (TEE);  Surgeon: [name], MD;  Location: [place];  Service: Open Heart Surgery;  Laterality: N/A;  . Upper gastrointestinal endoscopy    . Colonoscopy     Social History   Social History  . Marital Status: Married    Spouse Name: N/A  . Number of Children: N/A  . Years of Education: N/A   Social History Main Topics  . Smoking status: Never Smoker   . Smokeless tobacco: Never Used  . Alcohol Use: 0.0 oz/week    0 Standard drinks or equivalent per week     Comment: 09/13/2014 "might have a drink a couple times/yr"  . Drug Use: No  . Sexual Activity: Not Currently   Other Topics Concern  . None   Social History Narrative   ** Merged History Encounter **       Lives in [place] with 73 YO nephew and husband. 3 dogs outside.  Work - Doctor, general practice, [employer]  Diet - regular, limited meat  Exercise - walks some    Review of Systems  Constitutional: Negative for fever, chills, appetite change, fatigue and unexpected weight change.  Eyes: Negative for visual disturbance.  Respiratory: Negative for cough and shortness of breath.   Cardiovascular: Negative for chest pain, palpitations and leg swelling.  Gastrointestinal: Negative for nausea, vomiting, abdominal pain, diarrhea and constipation.  Musculoskeletal: Negative for myalgias and arthralgias.  Skin: Negative for color change and rash.  Hematological: Negative for adenopathy. Does not bruise/bleed easily.  Psychiatric/Behavioral: Negative for sleep disturbance and dysphoric mood. The patient is not nervous/anxious.        Objective:    BP 138/84 mmHg  Pulse 77  Ht 5\' 2"  (1.575 m)  Wt 187 lb 9.6 oz (85.095 kg)  BMI 34.30 kg/m2  SpO2 98% Physical Exam  Constitutional: She is oriented to person, place, and time. She appears well-developed and well-nourished. No distress.  HENT:  Head: Normocephalic and atraumatic.  Right Ear: External ear normal.  Left Ear: External  ear normal.  Nose: Nose normal.  Mouth/Throat: Oropharynx is clear and moist. No oropharyngeal exudate.  Eyes: Conjunctivae are normal. Pupils are equal, round, and reactive to light. Right eye exhibits no discharge. Left eye exhibits no discharge. No scleral icterus.  Neck: Normal range of motion. Neck  supple. No tracheal deviation present. No thyromegaly present.  Cardiovascular: Normal rate, regular rhythm, normal heart sounds and intact distal pulses.  Exam reveals no gallop and no friction rub.   No murmur heard. Pulmonary/Chest: Effort normal and breath sounds normal. No accessory muscle usage. No tachypnea. No respiratory distress. She has no decreased breath sounds. She has no wheezes. She has no rhonchi. She has no rales. She exhibits no tenderness. Right breast exhibits no inverted nipple, no mass, no nipple discharge, no skin change and no tenderness. Left breast exhibits mass and tenderness. Left breast exhibits no inverted nipple, no nipple discharge and no skin change. Breasts are symmetrical.    Abdominal: Soft. Bowel sounds are normal. She exhibits no distension and no mass. There is no tenderness. There is no rebound and no guarding.  Musculoskeletal: Normal range of motion. She exhibits no edema or tenderness.  Lymphadenopathy:    She has no cervical adenopathy.  Neurological: She is alert and oriented to person, place, and time. No cranial nerve deficit. She exhibits normal muscle tone. Coordination normal.  Skin: Skin is warm and dry. No rash noted. She is not diaphoretic. No erythema. No pallor.  Psychiatric: She has a normal mood and affect. Her behavior is normal. Judgment and thought content normal.          Assessment & Plan:   Problem List Items Addressed This Visit      Unprioritized   Breast lump - Primary    Left breast lump. Last mammogram 2014 BIRADS1. Strong family hx of breast cancer. Will get diagnostic mammogram and [place]. Follow up in 2 weeks.       Relevant Orders   MM Digital Diagnostic Bilat   US BREAST LTD UNI LEFT INC AXILLA   US BREAST LTD UNI RIGHT INC AXILLA   Depression    Symptoms of depression improved with addition of Sertraline. Will continue to monitor.          Return in about 2 weeks (around 12/06/2015) for Recheck.  Ronette Deter, MD Internal Medicine [facility]

## 2015-12-02 ENCOUNTER — Other Ambulatory Visit: Payer: Self-pay | Admitting: Internal Medicine

## 2015-12-02 ENCOUNTER — Telehealth: Payer: Self-pay | Admitting: Infectious Diseases

## 2015-12-02 ENCOUNTER — Ambulatory Visit
Admission: RE | Admit: 2015-12-02 | Discharge: 2015-12-02 | Disposition: A | Discharge status: Home / Self Care | Payer: Medicare Other | Source: Ambulatory Visit | Attending: Internal Medicine | Admitting: Internal Medicine

## 2015-12-02 DIAGNOSIS — N63 Unspecified lump in unspecified breast: Secondary | ICD-10-CM

## 2015-12-02 LAB — HM MAMMOGRAPHY

## 2015-12-02 NOTE — Telephone Encounter (Signed)
She is not having any of those symptoms. I will try to offer her reassurance. Thanks for your note! Delsa Sale

## 2015-12-02 NOTE — Telephone Encounter (Signed)
Debbie Delacruz I received a request for ID referral for this patient.  However it seems to be regarding the letter she received about potential exposure pt Non TB mycobacteria during her CABG last year.  As you know that letter was sent to thousands of people just letting them know of a potential exposure but very low risk of infection.  If she has not been ill with fevers, weight loss, etc infection would be very unlikely and she would not need any workup.  I can see if you still want me to but unlikely to do any further workup.  I will be out of town until Aug but can see her then if needed. Let me know Thanks Waunita Schooner

## 2015-12-04 ENCOUNTER — Other Ambulatory Visit: Payer: Self-pay | Admitting: Internal Medicine

## 2015-12-11 ENCOUNTER — Ambulatory Visit: Payer: Self-pay | Admitting: Internal Medicine

## 2015-12-11 ENCOUNTER — Encounter: Payer: Self-pay | Admitting: Internal Medicine

## 2015-12-16 ENCOUNTER — Encounter: Payer: Self-pay | Admitting: Internal Medicine

## 2015-12-18 NOTE — Telephone Encounter (Signed)
Yes ID clinic declined the referral. Dr. Dortha Schwalbe is a pulmonologist. I will send this to pulmonology at [facility].

## 2015-12-23 ENCOUNTER — Ambulatory Visit: Payer: Medicare Other | Admitting: Thoracic Surgery (Cardiothoracic Vascular Surgery)

## 2015-12-24 ENCOUNTER — Ambulatory Visit: Payer: Self-pay

## 2016-01-07 ENCOUNTER — Other Ambulatory Visit: Payer: Self-pay

## 2016-01-07 DIAGNOSIS — F329 Major depressive disorder, single episode, unspecified: Secondary | ICD-10-CM

## 2016-01-07 DIAGNOSIS — F32A Depression, unspecified: Secondary | ICD-10-CM

## 2016-01-07 MED ORDER — SERTRALINE HCL 50 MG PO TABS: 50.0000 mg | ORAL_TABLET | Freq: Every day | ORAL | 3 refills | Status: DC

## 2016-01-24 ENCOUNTER — Ambulatory Visit: Payer: Self-pay | Admitting: Family Medicine

## 2016-02-04 ENCOUNTER — Encounter: Payer: Self-pay | Admitting: Family Medicine

## 2016-02-06 ENCOUNTER — Other Ambulatory Visit: Payer: Self-pay | Admitting: Family Medicine

## 2016-02-06 DIAGNOSIS — A319 Mycobacterial infection, unspecified: Secondary | ICD-10-CM

## 2016-03-02 ENCOUNTER — Encounter: Payer: Self-pay | Admitting: Family Medicine

## 2016-03-05 ENCOUNTER — Other Ambulatory Visit: Payer: Self-pay | Admitting: Family Medicine

## 2016-03-05 ENCOUNTER — Encounter: Payer: Self-pay | Admitting: Thoracic Surgery (Cardiothoracic Vascular Surgery)

## 2016-03-05 ENCOUNTER — Encounter: Payer: Self-pay | Admitting: Family Medicine

## 2016-03-05 DIAGNOSIS — A319 Mycobacterial infection, unspecified: Secondary | ICD-10-CM

## 2016-03-09 ENCOUNTER — Ambulatory Visit: Payer: Medicare Other | Admitting: Thoracic Surgery (Cardiothoracic Vascular Surgery)

## 2016-03-10 ENCOUNTER — Ambulatory Visit: Payer: Self-pay | Admitting: Family Medicine

## 2016-03-16 ENCOUNTER — Ambulatory Visit: Payer: Self-pay | Admitting: Family Medicine

## 2016-03-18 ENCOUNTER — Encounter: Payer: Self-pay | Admitting: Family Medicine

## 2016-03-20 ENCOUNTER — Ambulatory Visit: Payer: Self-pay | Admitting: Family Medicine

## 2016-04-03 ENCOUNTER — Telehealth: Payer: Self-pay | Admitting: Family Medicine

## 2016-04-03 NOTE — Telephone Encounter (Signed)
I called pt and left a vm to call the office to sch AWV. Thank you! °

## 2016-04-06 ENCOUNTER — Ambulatory Visit: Payer: Medicare Other | Admitting: Thoracic Surgery (Cardiothoracic Vascular Surgery)

## 2016-04-20 ENCOUNTER — Ambulatory Visit: Payer: Self-pay | Admitting: Family Medicine

## 2016-04-27 ENCOUNTER — Ambulatory Visit: Payer: Self-pay | Admitting: Family Medicine

## 2016-05-15 ENCOUNTER — Other Ambulatory Visit: Payer: Self-pay

## 2016-05-15 ENCOUNTER — Ambulatory Visit: Payer: Self-pay

## 2016-05-15 MED ORDER — METOPROLOL SUCCINATE ER 50 MG PO TB24: 50.0000 mg | ORAL_TABLET | Freq: Every day | ORAL | 0 refills | Status: DC

## 2016-05-15 NOTE — Telephone Encounter (Signed)
Metoprolol sent to pharmacy. On review of most recent PCP note it appears that she is supposed to be on metoprolol, Imdur, and Norvasc. There is no mention of HCTZ. Please get the patient set up for follow-up. Thanks.

## 2016-05-15 NOTE — Telephone Encounter (Signed)
Last filled 02/19/15 30 0rf, pharmacy has also requested refill on hydrochlorothiazide which I see has been discontinued. I have left message with patient to confirm if she is taking this

## 2016-05-15 NOTE — Telephone Encounter (Signed)
Follow up on 05/27/16,lmtrc

## 2016-05-27 ENCOUNTER — Ambulatory Visit (INDEPENDENT_AMBULATORY_CARE_PROVIDER_SITE_OTHER): Payer: Medicare Other | Admitting: Family Medicine

## 2016-05-27 ENCOUNTER — Encounter: Payer: Self-pay | Admitting: Family Medicine

## 2016-05-27 ENCOUNTER — Ambulatory Visit (INDEPENDENT_AMBULATORY_CARE_PROVIDER_SITE_OTHER): Payer: Medicare Other

## 2016-05-27 VITALS — BP 130/64 | HR 96 | Temp 98.1°F | Wt 192.6 lb

## 2016-05-27 DIAGNOSIS — R059 Cough, unspecified: Secondary | ICD-10-CM

## 2016-05-27 DIAGNOSIS — R05 Cough: Secondary | ICD-10-CM

## 2016-05-27 DIAGNOSIS — I10 Essential (primary) hypertension: Secondary | ICD-10-CM

## 2016-05-27 DIAGNOSIS — J069 Acute upper respiratory infection, unspecified: Secondary | ICD-10-CM

## 2016-05-27 DIAGNOSIS — Z20818 Contact with and (suspected) exposure to other bacterial communicable diseases: Secondary | ICD-10-CM | POA: Diagnosis not present

## 2016-05-27 DIAGNOSIS — A319 Mycobacterial infection, unspecified: Secondary | ICD-10-CM | POA: Diagnosis not present

## 2016-05-27 DIAGNOSIS — B9789 Other viral agents as the cause of diseases classified elsewhere: Secondary | ICD-10-CM

## 2016-05-27 DIAGNOSIS — J40 Bronchitis, not specified as acute or chronic: Secondary | ICD-10-CM | POA: Insufficient documentation

## 2016-05-27 LAB — POCT INFLUENZA A/B
INFLUENZA B, POC: NEGATIVE
Influenza A, POC: NEGATIVE

## 2016-05-27 MED ORDER — BENZONATATE 200 MG PO CAPS: 200.0000 mg | ORAL_CAPSULE | Freq: Two times a day (BID) | ORAL | 0 refills | Status: DC | PRN

## 2016-05-27 NOTE — Assessment & Plan Note (Signed)
At goal. Continue current medications. 

## 2016-05-27 NOTE — Patient Instructions (Signed)
Nice to see you. Your symptoms are likely related to a virus though given your cough and your prior exposure to ENT and we will check a chest x-ray and a CBC. We will get she referred to infectious disease in the area. Can use the Tessalon for cough. If you develop cough productive of blood, trouble breathing, or fevers please seek medical attention immediately.

## 2016-05-27 NOTE — Progress Notes (Signed)
Pre visit review using our clinic review tool, if applicable. No additional management support is needed unless otherwise documented below in the visit note. 

## 2016-05-27 NOTE — Assessment & Plan Note (Signed)
Possible exposure to non-tubercular microbacterium during prior cardiac surgery. Has been having sweats at night since the surgery. No fevers or chills until her current respiratory illness. We'll attempt to refer her back to ID for evaluation and see if somebody will evaluate her for this. Check chest x-ray and CBC today.

## 2016-05-27 NOTE — Assessment & Plan Note (Signed)
Suspect recurrent respiratory illnesses related to viral upper respiratory infection. No focal findings to indicate bacterial illness. Check a chest x-ray given non-tubercular microbacterium exposure previously. CBC as well. Tessalon for cough. Stay well hydrated. Supportive care. Given return precautions.

## 2016-05-27 NOTE — Progress Notes (Signed)
Tommi Rumps, MD Phone: [phone]  Debbie Delacruz is a 71 y.o. female who presents today for follow-up.  Patient notes onset of cough 3-4 days ago. Notes some chest congestion, rhinorrhea, and aches. Had some chills last night though no sweats. No fevers. Has sick contacts at home. Does not feel like she's getting any better.  Patient was exposed to NTM during her CABG earlier this year. Has been referred to multiple infectious disease doctors though it appears that nobody will see her per her report. She notes no cough prior to her current illness. No fevers. Has been having some sweats at night reportedly since her surgery.  Hypertension: Checking at home. Typically in the 130s over 80s. Taking metoprolol, Imdur, and Norvasc. No chest pain or shortness of breath. No edema.  PMH: nonsmoker.   ROS see history of present illness  Objective  Physical Exam Vitals:   05/27/16 1548  BP: 130/64  Pulse: 96  Temp: 98.1 F (36.7 C)    BP Readings from Last 3 Encounters:  05/27/16 130/64  11/22/15 138/84  10/09/15 (!) 160/90   Wt Readings from Last 3 Encounters:  05/27/16 192 lb 9.6 oz (87.4 kg)  11/22/15 187 lb 9.6 oz (85.1 kg)  10/09/15 183 lb (83 kg)    Physical Exam  Constitutional: No distress.  HENT:  Head: Normocephalic and atraumatic.  Mouth/Throat: Oropharynx is clear and moist. No oropharyngeal exudate.  Normal TMs  Eyes: Conjunctivae are normal. Pupils are equal, round, and reactive to light.  Cardiovascular: Normal rate.   Pulmonary/Chest: Effort normal.  Musculoskeletal: She exhibits no edema.  Neurological: She is alert. Gait normal.  Skin: She is not diaphoretic.     Assessment/Plan: Please see individual problem list.  Contact with and (suspected) exposure to other bacterial communicable diseases Possible exposure to non-tubercular microbacterium during prior cardiac surgery. Has been having sweats at night since the surgery. No fevers or  chills until her current respiratory illness. We'll attempt to refer her back to ID for evaluation and see if somebody will evaluate her for this. Check chest x-ray and CBC today.  Viral upper respiratory illness Suspect recurrent respiratory illnesses related to viral upper respiratory infection. No focal findings to indicate bacterial illness. Check a chest x-ray given non-tubercular microbacterium exposure previously. CBC as well. Tessalon for cough. Stay well hydrated. Supportive care. Given return precautions.  Essential hypertension, benign At goal. Continue current medications.   Orders Placed This Encounter  Procedures  . DG Chest 2 View    Standing Status:   Future    Number of Occurrences:   1    Standing Expiration Date:   07/25/2017    Order Specific Question:   Reason for Exam (SYMPTOM  OR DIAGNOSIS REQUIRED)    Answer:   cough, chills, prior possible exposure to NTM    Order Specific Question:   Preferred imaging location?    Answer:   [facility]  . CBC w/Diff  . Ambulatory referral to Infectious Disease    Referral Priority:   Routine    Referral Type:   Consultation    Referral Reason:   Specialty Services Required    Requested Specialty:   Infectious Diseases    Number of Visits Requested:   1  . POCT Influenza A/B  Negative rapid flu.  Meds ordered this encounter  Medications  . benzonatate (TESSALON) 200 MG capsule    Sig: Take 1 capsule (200 mg total) by mouth 2 (two) times daily  as needed for cough.    Dispense:  20 capsule    Refill:  0    Tommi Rumps, MD [place]

## 2016-05-28 ENCOUNTER — Telehealth: Payer: Self-pay

## 2016-05-28 DIAGNOSIS — D72821 Monocytosis (symptomatic): Secondary | ICD-10-CM

## 2016-05-28 LAB — CBC WITH DIFFERENTIAL/PLATELET
BASOS PCT: 0.3 % (ref 0.0–3.0)
Basophils Absolute: 0 10*3/uL (ref 0.0–0.1)
EOS PCT: 10.5 % — AB (ref 0.0–5.0)
Eosinophils Absolute: 0.5 10*3/uL (ref 0.0–0.7)
HEMATOCRIT: 33.7 % — AB (ref 36.0–46.0)
HEMOGLOBIN: 10.9 g/dL — AB (ref 12.0–15.0)
LYMPHS PCT: 22.2 % (ref 12.0–46.0)
Lymphs Abs: 1.1 10*3/uL (ref 0.7–4.0)
MCHC: 32.4 g/dL (ref 30.0–36.0)
MCV: 71.8 fl — ABNORMAL LOW (ref 78.0–100.0)
Monocytes Absolute: 0.9 10*3/uL (ref 0.1–1.0)
Neutro Abs: 2.3 10*3/uL (ref 1.4–7.7)
Neutrophils Relative %: 48.1 % (ref 43.0–77.0)
Platelets: 261 10*3/uL (ref 150.0–400.0)
RBC: 4.69 Mil/uL (ref 3.87–5.11)
RDW: 17.9 % — AB (ref 11.5–15.5)
WBC: 4.9 10*3/uL (ref 4.0–10.5)

## 2016-05-28 NOTE — Telephone Encounter (Signed)
Pt coming for repeat labs 05/30/15. Please place future order. Thank you.

## 2016-05-28 NOTE — Telephone Encounter (Signed)
Orders placed.

## 2016-05-29 ENCOUNTER — Telehealth: Payer: Self-pay

## 2016-05-29 ENCOUNTER — Other Ambulatory Visit (INDEPENDENT_AMBULATORY_CARE_PROVIDER_SITE_OTHER): Payer: Medicare Other

## 2016-05-29 DIAGNOSIS — D72821 Monocytosis (symptomatic): Secondary | ICD-10-CM

## 2016-05-29 LAB — CBC WITH DIFFERENTIAL/PLATELET
BASOS PCT: 0.4 % (ref 0.0–3.0)
Basophils Absolute: 0 10*3/uL (ref 0.0–0.1)
EOS ABS: 0.4 10*3/uL (ref 0.0–0.7)
EOS PCT: 8.4 % — AB (ref 0.0–5.0)
HEMATOCRIT: 36.6 % (ref 36.0–46.0)
Hemoglobin: 11.7 g/dL — ABNORMAL LOW (ref 12.0–15.0)
Lymphocytes Relative: 21.6 % (ref 12.0–46.0)
Lymphs Abs: 1.1 10*3/uL (ref 0.7–4.0)
MCHC: 31.9 g/dL (ref 30.0–36.0)
MCV: 73.1 fl — ABNORMAL LOW (ref 78.0–100.0)
MONO ABS: 0.8 10*3/uL (ref 0.1–1.0)
Monocytes Relative: 15.4 % — ABNORMAL HIGH (ref 3.0–12.0)
NEUTROS ABS: 2.7 10*3/uL (ref 1.4–7.7)
Neutrophils Relative %: 54.2 % (ref 43.0–77.0)
PLATELETS: 255 10*3/uL (ref 150.0–400.0)
RBC: 5.01 Mil/uL (ref 3.87–5.11)
RDW: 18.1 % — AB (ref 11.5–15.5)
WBC: 4.9 10*3/uL (ref 4.0–10.5)

## 2016-05-29 NOTE — Telephone Encounter (Signed)
Patient notified and will go to the walk in clinic, patient states she has chest tightness only when she coughs, and still has sore throat and ear ache.

## 2016-05-29 NOTE — Telephone Encounter (Signed)
Patient should be re-evaluated given the tightness. Unfortunately we have no appointments. Given the tightness I would suggest urgent care or emergency room for evaluation. Thanks.

## 2016-05-29 NOTE — Telephone Encounter (Signed)
Patient came in for lab appointment and left note for you to inform you that she is not feeling better, the cough is worse,her throat hurts and she is having chest tightness. Please advise, patient was referred to infectious disease but has not yet been done.

## 2016-05-30 ENCOUNTER — Emergency Department
Admission: EM | Admit: 2016-05-30 | Discharge: 2016-05-30 | Disposition: A | Discharge status: Home / Self Care | Payer: Medicare Other | Attending: Student in an Organized Health Care Education/Training Program | Admitting: Student in an Organized Health Care Education/Training Program

## 2016-05-30 ENCOUNTER — Encounter: Payer: Self-pay | Admitting: *Deleted

## 2016-05-30 ENCOUNTER — Emergency Department: Payer: Medicare Other

## 2016-05-30 DIAGNOSIS — R059 Cough, unspecified: Secondary | ICD-10-CM

## 2016-05-30 DIAGNOSIS — R05 Cough: Secondary | ICD-10-CM | POA: Diagnosis not present

## 2016-05-30 DIAGNOSIS — I10 Essential (primary) hypertension: Secondary | ICD-10-CM | POA: Diagnosis not present

## 2016-05-30 DIAGNOSIS — Z7982 Long term (current) use of aspirin: Secondary | ICD-10-CM | POA: Diagnosis not present

## 2016-05-30 DIAGNOSIS — I2510 Atherosclerotic heart disease of native coronary artery without angina pectoris: Secondary | ICD-10-CM | POA: Insufficient documentation

## 2016-05-30 DIAGNOSIS — Z79899 Other long term (current) drug therapy: Secondary | ICD-10-CM | POA: Diagnosis not present

## 2016-05-30 DIAGNOSIS — J40 Bronchitis, not specified as acute or chronic: Secondary | ICD-10-CM | POA: Insufficient documentation

## 2016-05-30 LAB — BASIC METABOLIC PANEL
ANION GAP: 7 (ref 5–15)
BUN: 14 mg/dL (ref 6–20)
CHLORIDE: 105 mmol/L (ref 101–111)
CO2: 26 mmol/L (ref 22–32)
Calcium: 8.8 mg/dL — ABNORMAL LOW (ref 8.9–10.3)
Creatinine, Ser: 0.94 mg/dL (ref 0.44–1.00)
GFR calc Af Amer: >60 mL/min (ref >60)
GLUCOSE: 134 mg/dL — AB (ref 65–99)
Potassium: 4.4 mmol/L (ref 3.5–5.1)
Sodium: 138 mmol/L (ref 135–145)

## 2016-05-30 LAB — CBC
HCT: 38.3 % (ref 35.0–47.0)
HEMOGLOBIN: 12.1 g/dL (ref 12.0–16.0)
MCH: 23.2 pg — ABNORMAL LOW (ref 26.0–34.0)
MCHC: 31.6 g/dL — AB (ref 32.0–36.0)
MCV: 73.4 fL — AB (ref 80.0–100.0)
Platelets: 249 10*3/uL (ref 150–440)
RBC: 5.22 MIL/uL — ABNORMAL HIGH (ref 3.80–5.20)
RDW: 18.3 % — ABNORMAL HIGH (ref 11.5–14.5)
WBC: 5.3 10*3/uL (ref 3.6–11.0)

## 2016-05-30 LAB — TROPONIN I: Troponin I: <0.03 ng/mL (ref <0.03)

## 2016-05-30 MED ORDER — ALBUTEROL SULFATE (2.5 MG/3ML) 0.083% IN NEBU
5.0000 mg | INHALATION_SOLUTION | Freq: Once | RESPIRATORY_TRACT | Status: AC
  Administered 2016-05-30: 5 mg via RESPIRATORY_TRACT
  Filled 2016-05-30: qty 6

## 2016-05-30 MED ORDER — PREDNISONE 20 MG PO TABS: 40.0000 mg | ORAL_TABLET | Freq: Every day | ORAL | 0 refills | Status: AC

## 2016-05-30 MED ORDER — AZITHROMYCIN 500 MG PO TABS
500.0000 mg | ORAL_TABLET | Freq: Once | ORAL | Status: AC
  Administered 2016-05-30: 500 mg via ORAL
  Filled 2016-05-30: qty 1

## 2016-05-30 MED ORDER — AZITHROMYCIN 250 MG PO TABS: ORAL_TABLET | ORAL | 0 refills | Status: DC

## 2016-05-30 MED ORDER — OPTICHAMBER DIAMOND MISC
Status: AC
  Filled 2016-05-30: qty 1

## 2016-05-30 MED ORDER — PREDNISONE 10 MG PO TABS
ORAL_TABLET | ORAL | Status: AC
  Filled 2016-05-30: qty 1

## 2016-05-30 MED ORDER — ALBUTEROL SULFATE HFA 108 (90 BASE) MCG/ACT IN AERS: 2.0000 | INHALATION_SPRAY | Freq: Four times a day (QID) | RESPIRATORY_TRACT | 2 refills | Status: DC | PRN

## 2016-05-30 MED ORDER — OPTICHAMBER ADVANTAGE MISC: 1.0000 | Freq: Once | Status: DC

## 2016-05-30 MED ORDER — IPRATROPIUM-ALBUTEROL 0.5-2.5 (3) MG/3ML IN SOLN
RESPIRATORY_TRACT | Status: AC
  Filled 2016-05-30: qty 3

## 2016-05-30 MED ORDER — IPRATROPIUM-ALBUTEROL 0.5-2.5 (3) MG/3ML IN SOLN
3.0000 mL | Freq: Once | RESPIRATORY_TRACT | Status: AC
  Administered 2016-05-30: 3 mL via RESPIRATORY_TRACT

## 2016-05-30 MED ORDER — OPTICHAMBER DIAMOND MISC: 1.0000 | Freq: Once | Status: DC

## 2016-05-30 MED ORDER — IPRATROPIUM-ALBUTEROL 0.5-2.5 (3) MG/3ML IN SOLN
3.0000 mL | Freq: Once | RESPIRATORY_TRACT | Status: AC
  Administered 2016-05-30: 3 mL via RESPIRATORY_TRACT
  Filled 2016-05-30: qty 3

## 2016-05-30 MED ORDER — PREDNISONE 20 MG PO TABS
ORAL_TABLET | ORAL | Status: AC
  Filled 2016-05-30: qty 3

## 2016-05-30 MED ORDER — PREDNISONE 20 MG PO TABS
60.0000 mg | ORAL_TABLET | Freq: Once | ORAL | Status: AC
  Administered 2016-05-30: 60 mg via ORAL

## 2016-05-30 MED ORDER — DEXTROSE 5 % IV SOLN: 500.0000 mg | Freq: Once | INTRAVENOUS | Status: DC

## 2016-05-30 NOTE — ED Provider Notes (Signed)
[facility] Emergency Department Provider Note    First MD Initiated Contact with Patient 05/30/16 [phone]     (approximate)  I have reviewed the triage vital signs and the nursing notes.   HISTORY  Chief Complaint Cough    HPI Debbie Delacruz is a 71 y.o. female presents with 4 days of worsening shortness of breath and cough. Patient initially seen and PCP had a chest x-ray done and was told it was clear. Was given conservative management. The past 4 days has had worsening productive cough. No fevers. Has noted some wheezing. Denies any chest pain. Denies any nausea or vomiting. Having difficulty sleeping due to cough.   Past Medical History:  Diagnosis Date  . Anemia   . Anginal pain ([place])   . Anxiety   . Arthritis   . CAD (coronary artery disease)   . Carpal tunnel syndrome    bilateral  . Cataract    bil removed cataracts  . Coronary artery disease    2x stents, Dr. Clayborn Bigness  . Depression   . Dysrhythmia   . Family history of adverse reaction to anesthesia    "brother; S/P lipotripsy in [place]; transferred to [facility]; had to put him on life support for 2 days"  . GERD (gastroesophageal reflux disease)   . Headache    "weekly" (09/13/2014)  . History of blood transfusion 05/2014   "we haven't figured out why I needed it yet" (09/13/2014)  . History of hiatal hernia   . Hyperglycemia   . Hyperlipidemia   . Hypertension   . Myocardial infarct   . Myocardial infarct, old 1999   15 years ago  . Obesity   . PONV (postoperative nausea and vomiting)   . S/P CABG x 4 09/18/2014   LIMA to LAD, SVG to PDA-dRCA sequentially, SVG to OM, EVH via right thigh   . Sleep apnea   . Sleep apnea    "don't use a mask" (09/13/2014)  . Unstable angina (HCC)    Family History  Problem Relation Age of Onset  . Coronary artery disease Mother   . Osteoporosis Mother   . Heart disease Mother   . Stroke Mother   . Heart attack Mother   .  Hypertension Mother   . Hypertension Father   . Coronary artery disease Father   . COPD Father   . Heart disease Father   . Heart attack Father   . Glaucoma Father   . Osteoporosis Father   . Emphysema Father   . Stroke Maternal Grandfather   . Cancer Sister 31    colon  . Hypertension Sister   . Colon cancer Sister     dx in her 36's  . Breast cancer Sister   . Stroke Brother   . Hypertension Brother   . Esophageal cancer Neg Hx   . Stomach cancer Neg Hx   . Rectal cancer Neg Hx   . Pancreatic cancer Neg Hx   . Stroke Maternal Grandmother   . Osteoporosis Maternal Grandmother   . Hypertension Maternal Grandmother   . Hypertension Paternal Grandmother   . Hypertension Paternal Grandfather   . Breast cancer Maternal Aunt     2 aunts-50's   Past Surgical History:  Procedure Laterality Date  . ABDOMINAL HYSTERECTOMY    . APPENDECTOMY    . BACK SURGERY    . BLADDER SURGERY    . CARDIAC CATHETERIZATION  06/2014  . CARPAL TUNNEL RELEASE Left   .  CATARACT EXTRACTION W/ INTRAOCULAR LENS  IMPLANT, BILATERAL    . CHOLECYSTECTOMY    . COLONOSCOPY    . CORONARY ANGIOPLASTY WITH STENT PLACEMENT  1999   [facility] x2 stent  . CORONARY ARTERY BYPASS GRAFT N/A 09/18/2014   Procedure: CORONARY ARTERY BYPASS GRAFTING (CABG)times four using LIMA  to LAD:SVG to  PD and DIST RCA;SVG to OM;, EVH Right thigh;  Surgeon: [name], MD;  Location: [place];  Service: Open Heart Surgery;  Laterality: N/A;  . ESOPHAGOGASTRODUODENOSCOPY N/A 09/17/2014   Procedure: ESOPHAGOGASTRODUODENOSCOPY (EGD);  Surgeon: Inda Castle, MD;  Location: [place];  Service: Endoscopy;  Laterality: N/A;  . heart stents     1999  . INCONTINENCE SURGERY    . INGUINAL HERNIA REPAIR    . INGUINAL HERNIA REPAIR Right X 3   "last one was in the 1990's; still have hernia there now" (09/13/2014)  . LAPAROSCOPIC CHOLECYSTECTOMY    . LUMBAR DISC SURGERY  1990's?  . LUMBAR SPINE SURGERY    . removal of ovaries    . TEE  WITHOUT CARDIOVERSION N/A 09/18/2014   Procedure: TRANSESOPHAGEAL ECHOCARDIOGRAM (TEE);  Surgeon: [name], MD;  Location: [place];  Service: Open Heart Surgery;  Laterality: N/A;  . UPPER GASTROINTESTINAL ENDOSCOPY    . VAGINAL HYSTERECTOMY     Patient Active Problem List   Diagnosis Date Noted  . Viral upper respiratory illness 05/27/2016  . Breast lump 11/22/2015  . Contact with and (suspected) exposure to other bacterial communicable diseases 10/09/2015  . Depression 10/09/2015  . S/P CABG x 4 09/18/2014  . Barrett's esophagus 09/17/2014  . Duodenitis 09/17/2014  . History of gastritis   . GERD (gastroesophageal reflux disease)   . CAD (coronary artery disease)   . Medicare annual wellness visit, initial 03/28/2013  . Essential hypertension, benign 11/16/2012  . Anemia 11/16/2012  . Elevated blood sugar 11/16/2012  . Arthralgia 11/16/2012  . Screening for breast cancer 11/16/2012  . Special screening for malignant neoplasms, colon 11/16/2012  . Coronary artery disease 11/16/2012  . Dyslipidemia 09/15/2008  . COLONIC POLYPS, ADENOMATOUS, HX OF 02/20/2008  . ANXIETY 11/04/2006      Prior to Admission medications   Medication Sig Start Date End Date Taking? Authorizing Provider  acetaminophen (TYLENOL) 500 MG tablet Take 500 mg by mouth every 6 (six) hours as needed for headache.    Historical Provider, MD  albuterol (PROVENTIL HFA;VENTOLIN HFA) 108 (90 Base) MCG/ACT inhaler Inhale 2 puffs into the lungs every 6 (six) hours as needed for wheezing or shortness of breath. 05/30/16   Merlyn Lot, MD  amLODipine (NORVASC) 10 MG tablet TAKE 1 TABLET BY MOUTH ONCE DAILY. 12/04/15   [name], MD  aspirin 325 MG tablet Take 325 mg by mouth daily.    Historical Provider, MD  azithromycin (ZITHROMAX Z-PAK) 250 MG tablet take 1 tablet (250 mg) once daily on Days 2 through 5. 05/30/16 06/04/16  Merlyn Lot, MD  benzonatate (TESSALON) 200 MG capsule Take 1 capsule (200 mg  total) by mouth 2 (two) times daily as needed for cough. 05/27/16   [name], MD  isosorbide mononitrate (IMDUR) 60 MG 24 hr tablet Take 60 mg by mouth daily.    Historical Provider, MD  metoprolol succinate (TOPROL-XL) 50 MG 24 hr tablet Take 1 tablet (50 mg total) by mouth daily. Take with or immediately following a meal. 05/15/16   [name], MD  pantoprazole (PROTONIX) 40 MG tablet Take  1 tablet (40 mg total) by mouth 2 (two) times daily. 09/26/15   Ladene Artist, MD  predniSONE (DELTASONE) 20 MG tablet Take 2 tablets (40 mg total) by mouth daily. 05/30/16 06/04/16  Merlyn Lot, MD  rosuvastatin (CRESTOR) 10 MG tablet Take 1 tablet (10 mg total) by mouth daily. 10/16/15   [name], MD  sertraline (ZOLOFT) 50 MG tablet Take 1 tablet (50 mg total) by mouth daily. 01/07/16   [name], MD    Allergies Lipitor [atorvastatin]    Social History Social History  Substance Use Topics  . Smoking status: Never Smoker  . Smokeless tobacco: Never Used  . Alcohol use 0.0 oz/week     Comment: 09/13/2014 "might have a drink a couple times/yr"    Review of Systems Patient denies headaches, rhinorrhea, blurry vision, numbness, shortness of breath, chest pain, edema, cough, abdominal pain, nausea, vomiting, diarrhea, dysuria, fevers, rashes or hallucinations unless otherwise stated above in HPI. ____________________________________________   PHYSICAL EXAM:  VITAL SIGNS: Vitals:   05/30/16 0544 05/30/16 0700  BP: 122/71 127/75  Pulse: (!) 111 (!) 116  Resp: 19 (!) 29  Temp: 98.2 F (36.8 C)     Constitutional: Alert and oriented. Well appearing and in no acute distress. Eyes: Conjunctivae are normal. PERRL. EOMI. Head: Atraumatic. Nose: No congestion/rhinnorhea. Mouth/Throat: Mucous membranes are moist.  Oropharynx non-erythematous. Neck: No stridor. Painless ROM. No cervical spine tenderness to palpation Hematological/Lymphatic/Immunilogical: No cervical  lymphadenopathy. Cardiovascular: Normal rate, regular rhythm. Grossly normal heart sounds.  Good peripheral circulation. Respiratory: Normal respiratory effort.  No retractions. Lungs CTAB. Gastrointestinal: Soft and nontender. No distention. No abdominal bruits. No CVA tenderness. Genitourinary:  Musculoskeletal: No lower extremity tenderness nor edema.  No joint effusions. Neurologic:  Normal speech and language. No gross focal neurologic deficits are appreciated. No gait instability. Skin:  Skin is warm, dry and intact. No rash noted. Psychiatric: Mood and affect are normal. Speech and behavior are normal.  ____________________________________________   LABS (all labs ordered are listed, but only abnormal results are displayed)  Results for orders placed or performed during the hospital encounter of 05/30/16 (from the past 24 hour(s))  Basic metabolic panel     Status: Abnormal   Collection Time: 05/30/16  4:31 AM  Result Value Ref Range   Sodium 138 135 - 145 mmol/L   Potassium 4.4 3.5 - 5.1 mmol/L   Chloride 105 101 - 111 mmol/L   CO2 26 22 - 32 mmol/L   Glucose, Bld 134 (H) 65 - 99 mg/dL   BUN 14 6 - 20 mg/dL   Creatinine, Ser 0.94 0.44 - 1.00 mg/dL   Calcium 8.8 (L) 8.9 - 10.3 mg/dL   GFR calc non Af Amer >60 >60 mL/min   GFR calc Af Amer >60 >60 mL/min   Anion gap 7 5 - 15  CBC     Status: Abnormal   Collection Time: 05/30/16  4:31 AM  Result Value Ref Range   WBC 5.3 3.6 - 11.0 K/uL   RBC 5.22 (H) 3.80 - 5.20 MIL/uL   Hemoglobin 12.1 12.0 - 16.0 g/dL   HCT 38.3 35.0 - 47.0 %   MCV 73.4 (L) 80.0 - 100.0 fL   MCH 23.2 (L) 26.0 - 34.0 pg   MCHC 31.6 (L) 32.0 - 36.0 g/dL   RDW 18.3 (H) 11.5 - 14.5 %   Platelets 249 150 - 440 K/uL  Troponin I     Status: None   Collection  Time: 05/30/16  4:31 AM  Result Value Ref Range   Troponin I <0.03 <0.03 ng/mL   ____________________________________________  EKG My review and personal interpretation at Time: 4:23     Indication: cough  Rate: 90  Rhythm: sinus Axis: normal Other: sinus, no acute ischemia, normal intervals ____________________________________________  RADIOLOGY  I personally reviewed all radiographic images ordered to evaluate for the above acute complaints and reviewed radiology reports and findings.  These findings were personally discussed with the patient.  Please see medical record for radiology report. ____________________________________________   PROCEDURES  Procedure(s) performed:  Procedures    Critical Care performed: no ____________________________________________   INITIAL IMPRESSION / ASSESSMENT AND PLAN / ED COURSE  Pertinent labs & imaging results that were available during my care of the patient were reviewed by me and considered in my medical decision making (see chart for details).  DDX: Asthma, copd, CHF, pna, ptx, malignancy, Pe, anemia   Jennean Guillet is a 71 y.o. who presents to the ED with 4 days of worsening cough and shortness of breath. Patient afebrile and hemodynamically stable without any acute hypoxia. Her exam is consistent with acute bronchitis that she has diffuse expiratory wheezing with cough. Have very low suspicion for ACS. Although she does have history of CABG, she denies any chest pain or pressure. Her EKG shows no acute ischemia and her troponin is negative. Chest x-ray was ordered to evaluate for any lobar pneumonia shows none. Do not feel this clinically consistent with PE area plan nebulizer treatment, steroids and azithromycin. We'll reassess.  Clinical Course as of May 30 716  Sat May 30, 2016  [id number] Patient with marked improvement after nebulizer treatments. Significant improvement in airway movement. Still with significant cough the patient states she feels much improved. Based on her rapid improvement after nebulizer treatments do not feel this clinically consistent with pulmonary embolism. She is still mildly tachycardic  but has received nebulizer treatments and was not tachycardic upon arrival. She was able to ambulate with a steady gait without any hypoxia. She is able to tolerate oral medications. At this point I have offered admission to hospital for further symptomatic management.  Patient prefers a trial of outpatient management. Do feel that her presentation is consistent with bronchitis and has good follow-up.  Have discussed with the patient and available family all diagnostics and treatments performed thus far and all questions were answered to the best of my ability. The patient demonstrates understanding and agreement with plan.   [PR]    Clinical Course User Index [PR] Merlyn Lot, MD     ____________________________________________   FINAL CLINICAL IMPRESSION(S) / ED DIAGNOSES  Final diagnoses:  Bronchitis  Cough      NEW MEDICATIONS STARTED DURING THIS VISIT:  New Prescriptions   ALBUTEROL (PROVENTIL HFA;VENTOLIN HFA) 108 (90 BASE) MCG/ACT INHALER    Inhale 2 puffs into the lungs every 6 (six) hours as needed for wheezing or shortness of breath.   AZITHROMYCIN (ZITHROMAX Z-PAK) 250 MG TABLET    take 1 tablet (250 mg) once daily on Days 2 through 5.   PREDNISONE (DELTASONE) 20 MG TABLET    Take 2 tablets (40 mg total) by mouth daily.     Note:  This document was prepared using Dragon voice recognition software and may include unintentional dictation errors.    Merlyn Lot, MD 05/30/16 [phone]

## 2016-05-30 NOTE — ED Notes (Signed)
MD Quentin Cornwall requested to ambulate patient while monitoring pulse oximetry. Pt's O2 saturation stable at 94-95%. Pt denied SOB, dizziness, lightheadedness. Pt ambulated with steady gate. MD Quentin Cornwall informed.

## 2016-05-30 NOTE — ED Notes (Signed)
Patient c/o cough X 4 days. Pt denies chest pain/tightness, nasal congestion/drainage, SOB. Patient reports coughing worsens when she lays flat. Pt was given albuterol treatment in triage. Pt reports some relief with breathing treatment.

## 2016-05-30 NOTE — ED Triage Notes (Signed)
Pt presents w/ dry, bronchitic cough since this past Sunday. Pt saw PCP on Wed, CXR done and was clear. Pt given rx for tesslon perles, has been taking them w/o relief. Pt has taken OTC cough syrup w/o relief. Pt states coughing worse when lying flat and she is unable to sleep and has been. Pt denies CP and SoB, fever, n/v/d.

## 2016-05-30 NOTE — ED Notes (Signed)
Patient finished Neb Treatment, states improvement since arrival.

## 2016-05-30 NOTE — ED Notes (Signed)
MD Robinson at bedside 

## 2016-06-01 ENCOUNTER — Telehealth: Payer: Self-pay | Admitting: *Deleted

## 2016-06-01 LAB — PATHOLOGIST SMEAR REVIEW

## 2016-06-01 NOTE — Telephone Encounter (Signed)
Patient will need a ER follow up, she was seen at [facility] on 01/06 for bronchitis, pt was advised to have a follow up after meds are completed on 06/05/16. Please give a time and date for Pt to follow up next week.

## 2016-06-01 NOTE — Telephone Encounter (Signed)
Please schedule for Monday 05/1516 11:15 and block 1130, thank you

## 2016-06-02 NOTE — Telephone Encounter (Signed)
Patient scheduled and a message was left on voicemail with time and date

## 2016-06-08 ENCOUNTER — Encounter: Payer: Self-pay | Admitting: Family Medicine

## 2016-06-08 ENCOUNTER — Ambulatory Visit (INDEPENDENT_AMBULATORY_CARE_PROVIDER_SITE_OTHER): Payer: Medicare Other | Admitting: Family Medicine

## 2016-06-08 ENCOUNTER — Other Ambulatory Visit: Payer: Self-pay | Admitting: Family Medicine

## 2016-06-08 ENCOUNTER — Other Ambulatory Visit (INDEPENDENT_AMBULATORY_CARE_PROVIDER_SITE_OTHER): Payer: Medicare Other

## 2016-06-08 DIAGNOSIS — J40 Bronchitis, not specified as acute or chronic: Secondary | ICD-10-CM | POA: Diagnosis not present

## 2016-06-08 DIAGNOSIS — D509 Iron deficiency anemia, unspecified: Secondary | ICD-10-CM | POA: Diagnosis not present

## 2016-06-08 MED ORDER — PREDNISONE 10 MG PO TABS: ORAL_TABLET | ORAL | 0 refills | Status: DC

## 2016-06-08 MED ORDER — HYDROCODONE-HOMATROPINE 5-1.5 MG/5ML PO SYRP: 5.0000 mL | ORAL_SOLUTION | Freq: Three times a day (TID) | ORAL | 0 refills | Status: DC | PRN

## 2016-06-08 NOTE — Patient Instructions (Signed)
Nice to see you. We will prolong your steroid taper. We will treat your cough with Hycodan. Please monitor your symptoms and if they do not continue to improve please let us know. If you develop shortness of breath, cough productive of blood, or fevers please seek medical attention immediately.

## 2016-06-08 NOTE — Progress Notes (Signed)
Pre visit review using our clinic review tool, if applicable. No additional management support is needed unless otherwise documented below in the visit note. 

## 2016-06-08 NOTE — Progress Notes (Signed)
  Tommi Rumps, MD Phone: [phone]  Debbie Delacruz is a 71 y.o. female who presents today for same-day visit.  Patient previously seen by me for this issue. Patient notes continuing to cough after being seen in the emergency room. At that time she had some shortness of breath and wheezing. They treated her with nebulizers and she had improvement in her symptoms at that time. Discharged her with azithromycin and prednisone. She has finished these though continues to have cough. Cough is nonproductive. No chest pain. No fevers. Has decreased in energy. Cough has not gotten any better. The inhaler does help.   ROS see history of present illness  Objective  Physical Exam Vitals:   06/08/16 1058  BP: 134/80  Pulse: 94  Temp: 98.4 F (36.9 C)    BP Readings from Last 3 Encounters:  06/08/16 134/80  05/30/16 127/75  05/27/16 130/64   Wt Readings from Last 3 Encounters:  06/08/16 189 lb 14.4 oz (86.1 kg)  05/30/16 187 lb (84.8 kg)  05/27/16 192 lb 9.6 oz (87.4 kg)    Physical Exam  Constitutional: No distress.  HENT:  Head: Normocephalic.  Mouth/Throat: Oropharynx is clear and moist. No oropharyngeal exudate.  Eyes: Conjunctivae are normal. Pupils are equal, round, and reactive to light.  Cardiovascular: Normal rate, regular rhythm and normal heart sounds.   Pulmonary/Chest: Effort normal. No respiratory distress. She has wheezes (faint scattered wheezes). She has no rales.  Neurological: She is alert. Gait normal.  Skin: Skin is warm and dry. She is not diaphoretic.     Assessment/Plan: Please see individual problem list.  Bronchitis Patient's symptoms most consistent with bronchitis. I discussed the natural course of bronchitis and that it may take several weeks for this to improve significantly. Given mild faint wheezes we will extend her course of prednisone and taper her off of it. She'll continue the inhaler as needed. Hycodan for cough. Discussed that this  may make her drowsy. Continue to monitor her symptoms. Given return precautions.   No orders of the defined types were placed in this encounter.   Meds ordered this encounter  Medications  . predniSONE (DELTASONE) 10 MG tablet    Sig: Please take 40 mg (4 tablets) by mouth daily for 4 days, then decrease by 1 tablet daily until gone    Dispense:  22 tablet    Refill:  0  . HYDROcodone-homatropine (HYCODAN) 5-1.5 MG/5ML syrup    Sig: Take 5 mLs by mouth every 8 (eight) hours as needed for cough.    Dispense:  120 mL    Refill:  0    Tommi Rumps, MD [place]

## 2016-06-08 NOTE — Assessment & Plan Note (Addendum)
Patient's symptoms most consistent with bronchitis. I discussed the natural course of bronchitis and that it may take several weeks for this to improve significantly. Given mild faint wheezes we will extend her course of prednisone and taper her off of it. She'll continue the inhaler as needed. Hycodan for cough. Discussed that this may make her drowsy. Continue to monitor her symptoms. Given return precautions.

## 2016-06-09 LAB — IRON,TIBC AND FERRITIN PANEL
%SAT: 10 % — AB (ref 11–50)
Ferritin: 7 ng/mL — ABNORMAL LOW (ref 20–288)
Iron: 38 ug/dL — ABNORMAL LOW (ref 45–160)
TIBC: 399 ug/dL (ref 250–450)

## 2016-06-10 ENCOUNTER — Other Ambulatory Visit: Payer: Self-pay | Admitting: Family Medicine

## 2016-06-10 MED ORDER — FERROUS SULFATE 325 (65 FE) MG PO TABS: 325.0000 mg | ORAL_TABLET | Freq: Every day | ORAL | 1 refills | Status: DC

## 2016-06-12 ENCOUNTER — Encounter: Payer: Self-pay | Admitting: Family Medicine

## 2016-06-12 NOTE — Telephone Encounter (Signed)
Talked with referrals and looks like in order toget infectious disease to except patient we have to have confirmation patient exposed or a positive PPD or blood test.

## 2016-06-15 ENCOUNTER — Observation Stay (HOSPITAL_COMMUNITY)
Admission: EM | Admit: 2016-06-15 | Discharge: 2016-06-17 | Disposition: A | Discharge status: Home / Self Care | Payer: Medicare Other | Attending: Internal Medicine | Admitting: Internal Medicine

## 2016-06-15 ENCOUNTER — Encounter (HOSPITAL_COMMUNITY): Payer: Self-pay | Admitting: Emergency Medicine

## 2016-06-15 DIAGNOSIS — R55 Syncope and collapse: Secondary | ICD-10-CM | POA: Diagnosis not present

## 2016-06-15 DIAGNOSIS — Z955 Presence of coronary angioplasty implant and graft: Secondary | ICD-10-CM | POA: Diagnosis not present

## 2016-06-15 DIAGNOSIS — K219 Gastro-esophageal reflux disease without esophagitis: Secondary | ICD-10-CM | POA: Diagnosis not present

## 2016-06-15 DIAGNOSIS — I214 Non-ST elevation (NSTEMI) myocardial infarction: Secondary | ICD-10-CM | POA: Insufficient documentation

## 2016-06-15 DIAGNOSIS — I2510 Atherosclerotic heart disease of native coronary artery without angina pectoris: Secondary | ICD-10-CM | POA: Diagnosis not present

## 2016-06-15 DIAGNOSIS — R739 Hyperglycemia, unspecified: Secondary | ICD-10-CM | POA: Diagnosis not present

## 2016-06-15 DIAGNOSIS — Z803 Family history of malignant neoplasm of breast: Secondary | ICD-10-CM | POA: Diagnosis not present

## 2016-06-15 DIAGNOSIS — I10 Essential (primary) hypertension: Secondary | ICD-10-CM | POA: Diagnosis not present

## 2016-06-15 DIAGNOSIS — D649 Anemia, unspecified: Secondary | ICD-10-CM | POA: Insufficient documentation

## 2016-06-15 DIAGNOSIS — I25710 Atherosclerosis of autologous vein coronary artery bypass graft(s) with unstable angina pectoris: Secondary | ICD-10-CM | POA: Insufficient documentation

## 2016-06-15 DIAGNOSIS — E785 Hyperlipidemia, unspecified: Secondary | ICD-10-CM | POA: Diagnosis not present

## 2016-06-15 DIAGNOSIS — Z882 Allergy status to sulfonamides status: Secondary | ICD-10-CM | POA: Diagnosis not present

## 2016-06-15 DIAGNOSIS — G4730 Sleep apnea, unspecified: Secondary | ICD-10-CM | POA: Insufficient documentation

## 2016-06-15 DIAGNOSIS — F329 Major depressive disorder, single episode, unspecified: Secondary | ICD-10-CM | POA: Insufficient documentation

## 2016-06-15 DIAGNOSIS — Z8249 Family history of ischemic heart disease and other diseases of the circulatory system: Secondary | ICD-10-CM | POA: Insufficient documentation

## 2016-06-15 DIAGNOSIS — F419 Anxiety disorder, unspecified: Secondary | ICD-10-CM | POA: Diagnosis not present

## 2016-06-15 DIAGNOSIS — Z951 Presence of aortocoronary bypass graft: Secondary | ICD-10-CM

## 2016-06-15 DIAGNOSIS — Z7952 Long term (current) use of systemic steroids: Secondary | ICD-10-CM | POA: Diagnosis not present

## 2016-06-15 DIAGNOSIS — K449 Diaphragmatic hernia without obstruction or gangrene: Secondary | ICD-10-CM | POA: Insufficient documentation

## 2016-06-15 DIAGNOSIS — E669 Obesity, unspecified: Secondary | ICD-10-CM | POA: Insufficient documentation

## 2016-06-15 DIAGNOSIS — Z6833 Body mass index (BMI) 33.0-33.9, adult: Secondary | ICD-10-CM | POA: Insufficient documentation

## 2016-06-15 DIAGNOSIS — I252 Old myocardial infarction: Secondary | ICD-10-CM | POA: Insufficient documentation

## 2016-06-15 DIAGNOSIS — I700 Atherosclerosis of aorta: Secondary | ICD-10-CM | POA: Insufficient documentation

## 2016-06-15 DIAGNOSIS — N179 Acute kidney failure, unspecified: Secondary | ICD-10-CM | POA: Diagnosis not present

## 2016-06-15 DIAGNOSIS — Z7982 Long term (current) use of aspirin: Secondary | ICD-10-CM | POA: Diagnosis not present

## 2016-06-15 DIAGNOSIS — M1990 Unspecified osteoarthritis, unspecified site: Secondary | ICD-10-CM | POA: Insufficient documentation

## 2016-06-15 DIAGNOSIS — Z800 Family history of malignant neoplasm of digestive organs: Secondary | ICD-10-CM | POA: Diagnosis not present

## 2016-06-15 DIAGNOSIS — K210 Gastro-esophageal reflux disease with esophagitis: Secondary | ICD-10-CM | POA: Diagnosis not present

## 2016-06-15 DIAGNOSIS — R404 Transient alteration of awareness: Secondary | ICD-10-CM | POA: Diagnosis not present

## 2016-06-15 DIAGNOSIS — D509 Iron deficiency anemia, unspecified: Secondary | ICD-10-CM | POA: Diagnosis present

## 2016-06-15 DIAGNOSIS — Z823 Family history of stroke: Secondary | ICD-10-CM | POA: Diagnosis not present

## 2016-06-15 DIAGNOSIS — K2270 Barrett's esophagus without dysplasia: Secondary | ICD-10-CM | POA: Insufficient documentation

## 2016-06-15 LAB — BASIC METABOLIC PANEL
ANION GAP: 11 (ref 5–15)
BUN: 26 mg/dL — ABNORMAL HIGH (ref 6–20)
CO2: 24 mmol/L (ref 22–32)
Calcium: 8.8 mg/dL — ABNORMAL LOW (ref 8.9–10.3)
Chloride: 104 mmol/L (ref 101–111)
Creatinine, Ser: 1.17 mg/dL — ABNORMAL HIGH (ref 0.44–1.00)
GFR calc Af Amer: 54 mL/min — ABNORMAL LOW (ref >60)
GFR, EST NON AFRICAN AMERICAN: 47 mL/min — AB (ref >60)
GLUCOSE: 123 mg/dL — AB (ref 65–99)
POTASSIUM: 3.9 mmol/L (ref 3.5–5.1)
Sodium: 139 mmol/L (ref 135–145)

## 2016-06-15 LAB — CBC
HEMATOCRIT: 32 % — AB (ref 36.0–46.0)
HEMOGLOBIN: 9.5 g/dL — AB (ref 12.0–15.0)
MCH: 22.8 pg — ABNORMAL LOW (ref 26.0–34.0)
MCHC: 29.7 g/dL — AB (ref 30.0–36.0)
MCV: 76.7 fL — ABNORMAL LOW (ref 78.0–100.0)
Platelets: 314 10*3/uL (ref 150–400)
RBC: 4.17 MIL/uL (ref 3.87–5.11)
RDW: 18.1 % — ABNORMAL HIGH (ref 11.5–15.5)
WBC: 8.5 10*3/uL (ref 4.0–10.5)

## 2016-06-15 LAB — URINALYSIS, ROUTINE W REFLEX MICROSCOPIC
BILIRUBIN URINE: NEGATIVE
Glucose, UA: NEGATIVE mg/dL
HGB URINE DIPSTICK: NEGATIVE
KETONES UR: NEGATIVE mg/dL
Leukocytes, UA: NEGATIVE
Nitrite: NEGATIVE
PH: 7 (ref 5.0–8.0)
Protein, ur: NEGATIVE mg/dL
Specific Gravity, Urine: 1.017 (ref 1.005–1.030)

## 2016-06-15 LAB — I-STAT TROPONIN, ED: Troponin i, poc: 0.01 ng/mL (ref 0.00–0.08)

## 2016-06-15 MED ORDER — SODIUM CHLORIDE 0.9 % IV BOLUS (SEPSIS)
1000.0000 mL | Freq: Once | INTRAVENOUS | Status: AC
  Administered 2016-06-15: 1000 mL via INTRAVENOUS

## 2016-06-15 NOTE — ED Notes (Signed)
Attempted report x1. 

## 2016-06-15 NOTE — ED Notes (Signed)
Pt made aware of bed assignment 

## 2016-06-15 NOTE — H&P (Signed)
History and Physical        Hospital Admission Note Date: 06/15/2016  Patient name: Debbie Delacruz record number: [id number] Date of birth: March 09, 1946 Age: 71 y.o. Gender: female  PCP: Tommi Rumps, MD   Referring physician: Dr Lovena Le  Patient coming from: work via EMS   Chief Complaint:  Passed out  HPI: Patient is a 71 year old female with history of chronic anemia, coronary artery disease status post stent 2, CABG x4v, hypertension, hyperlipidemia, dysrhythmias, follows cardiology at [facility] presented with syncopal episode. History was obtained from the patient who has been going through extreme stressful period at home recently. Patient reported that her daughter-in-law committed suicide last week and they have 55-year-old grandson who was with her at the time. This morning, patient woke up, ate light breakfast, went to work around 8 AM. Around 10 AM, she felt slightly nauseous, dizzy and lightheaded. Next thing, she remembers is being with the EMS. She otherwise did not have any recollection of the event. She denied any chest pain, shortness of breath, any palpitations. No reports of any seizure-like activity, slurred speech or focal neurological deficits. Patient denied any recent vomiting or diarrhea. She reports that she recently had bronchitis in the last 2 weeks.   ED work-up/course:  At the time of triage, BP was 99/65, temp 98.8, respiratory rate 18, pulse 84 Sodium 139, potassium 3.9, creatinine 1.17, baseline creatinine 0.94 Troponin 0.01 Hemoglobin 9.5, hemoglobin was 12.1 on 05/30/16 EKG rate 85, normal sinus rhythm, no acute ST-T wave changes  Review of Systems: Positives marked in 'bold' Constitutional: Denies fever, chills, diaphoresis, poor appetite and fatigue.  HEENT: Denies photophobia, eye pain, redness, hearing loss, ear pain,  congestion, sore throat, rhinorrhea, sneezing, mouth sores, trouble swallowing, neck pain, neck stiffness and tinnitus.   Respiratory: Denies SOB, DOE, cough, chest tightness,  and wheezing.   Cardiovascular: Denies chest pain, palpitations and leg swelling.  Gastrointestinal: + nausea, no vomiting, abdominal pain, diarrhea, constipation, blood in stool and abdominal distention.  Genitourinary: Denies dysuria, urgency, frequency, hematuria, flank pain and difficulty urinating.  Musculoskeletal: Denies myalgias, back pain, joint swelling, arthralgias and gait problem.  Skin: Denies pallor, rash and wound.  Neurological:  dizziness, syncope, light-headedness, numbness and headaches. No seizures  Hematological: Denies adenopathy. Easy bruising, personal or family bleeding history  Psychiatric/Behavioral: Denies suicidal ideation, mood changes, confusion, nervousness, sleep disturbance and agitation  Past Medical History: Past Medical History:  Diagnosis Date  . Anemia   . Anginal pain ([place])   . Anxiety   . Arthritis   . CAD (coronary artery disease)   . Carpal tunnel syndrome    bilateral  . Cataract    bil removed cataracts  . Coronary artery disease    2x stents, Dr. Clayborn Bigness  . Depression   . Dysrhythmia   . Family history of adverse reaction to anesthesia    "brother; S/P lipotripsy in [place]; transferred to [facility]; had to put him on life support for 2 days"  . GERD (gastroesophageal reflux disease)   . Headache    "weekly" (09/13/2014)  . History of blood transfusion 05/2014   "we haven't figured out why I  needed it yet" (09/13/2014)  . History of hiatal hernia   . Hyperglycemia   . Hyperlipidemia   . Hypertension   . Myocardial infarct   . Myocardial infarct, old 1999   15 years ago  . Obesity   . PONV (postoperative nausea and vomiting)   . S/P CABG x 4 09/18/2014   LIMA to LAD, SVG to PDA-dRCA sequentially, SVG to OM, EVH via right thigh   . Sleep apnea   .  Sleep apnea    "don't use a mask" (09/13/2014)  . Unstable angina [facility])     Past Surgical History:  Procedure Laterality Date  . ABDOMINAL HYSTERECTOMY    . APPENDECTOMY    . BACK SURGERY    . BLADDER SURGERY    . CARDIAC CATHETERIZATION  06/2014  . CARPAL TUNNEL RELEASE Left   . CATARACT EXTRACTION W/ INTRAOCULAR LENS  IMPLANT, BILATERAL    . CHOLECYSTECTOMY    . COLONOSCOPY    . CORONARY ANGIOPLASTY WITH STENT PLACEMENT  1999   [facility] x2 stent  . CORONARY ARTERY BYPASS GRAFT N/A 09/18/2014   Procedure: CORONARY ARTERY BYPASS GRAFTING (CABG)times four using LIMA  to LAD:SVG to  PD and DIST RCA;SVG to OM;, EVH Right thigh;  Surgeon: [name], MD;  Location: [facility];  Service: Open Heart Surgery;  Laterality: N/A;  . ESOPHAGOGASTRODUODENOSCOPY N/A 09/17/2014   Procedure: ESOPHAGOGASTRODUODENOSCOPY (EGD);  Surgeon: Inda Castle, MD;  Location: [facility];  Service: Endoscopy;  Laterality: N/A;  . heart stents     1999  . INCONTINENCE SURGERY    . INGUINAL HERNIA REPAIR    . INGUINAL HERNIA REPAIR Right X 3   "last one was in the 1990's; still have hernia there now" (09/13/2014)  . LAPAROSCOPIC CHOLECYSTECTOMY    . LUMBAR DISC SURGERY  1990's?  . LUMBAR SPINE SURGERY    . removal of ovaries    . TEE WITHOUT CARDIOVERSION N/A 09/18/2014   Procedure: TRANSESOPHAGEAL ECHOCARDIOGRAM (TEE);  Surgeon: [name], MD;  Location: [place];  Service: Open Heart Surgery;  Laterality: N/A;  . UPPER GASTROINTESTINAL ENDOSCOPY    . VAGINAL HYSTERECTOMY      Medications: Prior to Admission medications   Medication Sig Start Date End Date Taking? Authorizing Provider  acetaminophen (TYLENOL) 500 MG tablet Take 500 mg by mouth every 6 (six) hours as needed for headache.   Yes Historical Provider, MD  albuterol (PROVENTIL HFA;VENTOLIN HFA) 108 (90 Base) MCG/ACT inhaler Inhale 2 puffs into the lungs every 6 (six) hours as needed for wheezing or shortness of breath. 05/30/16  Yes Merlyn Lot, MD  amLODipine (NORVASC) 10 MG tablet TAKE 1 TABLET BY MOUTH ONCE DAILY. 12/04/15  Yes [name], MD  aspirin 325 MG tablet Take 325 mg by mouth daily.   Yes Historical Provider, MD  benzonatate (TESSALON) 200 MG capsule Take 1 capsule (200 mg total) by mouth 2 (two) times daily as needed for cough. 05/27/16  Yes [name], MD  isosorbide mononitrate (IMDUR) 60 MG 24 hr tablet Take 60 mg by mouth daily.   Yes Historical Provider, MD  metoprolol succinate (TOPROL-XL) 50 MG 24 hr tablet Take 1 tablet (50 mg total) by mouth daily. Take with or immediately following a meal. 05/15/16  Yes [name], MD  pantoprazole (PROTONIX) 40 MG tablet Take 1 tablet (40 mg total) by mouth 2 (two) times daily. 09/26/15  Yes Ladene Artist, MD  rosuvastatin (CRESTOR) 10  MG tablet Take 1 tablet (10 mg total) by mouth daily. 10/16/15  Yes [name], MD  sertraline (ZOLOFT) 50 MG tablet Take 1 tablet (50 mg total) by mouth daily. 01/07/16  Yes [name], MD  Throat Lozenges (COUGH DROPS MT) Use as directed 1 lozenge in the mouth or throat daily as needed (COUGH).   Yes Historical Provider, MD  ferrous sulfate 325 (65 FE) MG tablet Take 1 tablet (325 mg total) by mouth daily with breakfast. 06/10/16   [name], MD  HYDROcodone-homatropine [facility]) 5-1.5 MG/5ML syrup Take 5 mLs by mouth every 8 (eight) hours as needed for cough. 06/08/16   [name], MD  predniSONE (DELTASONE) 10 MG tablet Please take 40 mg (4 tablets) by mouth daily for 4 days, then decrease by 1 tablet daily until gone Patient not taking: Reported on 06/15/2016 06/08/16   [name], MD    Allergies:   Allergies  Allergen Reactions  . Ferrous Sulfate [Ferrous Fumarate] Other (See Comments)    STOMACH ISSUES  . Lipitor [Atorvastatin] Other (See Comments)    myalgia    Social History:  reports that she has never smoked. She has never used smokeless tobacco. She reports that she  drinks alcohol. She reports that she does not use drugs.  Family History: Family History  Problem Relation Age of Onset  . Coronary artery disease Mother   . Osteoporosis Mother   . Heart disease Mother   . Stroke Mother   . Heart attack Mother   . Hypertension Mother   . Hypertension Father   . Coronary artery disease Father   . COPD Father   . Heart disease Father   . Heart attack Father   . Glaucoma Father   . Osteoporosis Father   . Emphysema Father   . Cancer Sister 34    colon  . Hypertension Sister   . Colon cancer Sister     dx in her 76's  . Breast cancer Sister   . Stroke Brother   . Hypertension Brother   . Stroke Maternal Grandfather   . Stroke Maternal Grandmother   . Osteoporosis Maternal Grandmother   . Hypertension Maternal Grandmother   . Hypertension Paternal Grandmother   . Hypertension Paternal Grandfather   . Breast cancer Maternal Aunt     2 aunts-50's  . Esophageal cancer Neg Hx   . Stomach cancer Neg Hx   . Rectal cancer Neg Hx   . Pancreatic cancer Neg Hx     Physical Exam: Blood pressure 133/67, pulse 90, temperature 98 F (36.7 C), temperature source Oral, resp. rate 16, height 5' 2.5" (1.588 m), weight 84.8 kg (187 lb), SpO2 100 %. General: Alert, awake, oriented x3, in no acute distress. HEENT: normocephalic, atraumatic, anicteric sclera, pink conjunctiva, pupils equal and reactive to light and accomodation, oropharynx clear Neck: supple, no masses or lymphadenopathy, no goiter, no bruits  Heart: Regular rate and rhythm, without murmurs, rubs or gallops. Lungs: Clear to auscultation bilaterally, no wheezing, rales or rhonchi. Abdomen: Soft, nontender, nondistended, positive bowel sounds, no masses. Extremities: No clubbing, cyanosis or edema with positive pedal pulses. Neuro: Grossly intact, no focal neurological deficits, strength 5/5 upper and lower extremities bilaterally Psych: alert and oriented x 3, normal mood and affect Skin:  no rashes or lesions, warm and dry   LABS on Admission:  Basic Metabolic Panel:  Recent Labs Lab 06/15/16 1142  NA 139  K 3.9  CL 104  CO2 24  GLUCOSE 123*  BUN 26*  CREATININE 1.17*  CALCIUM 8.8*   Liver Function Tests: No results for input(s): AST, ALT, ALKPHOS, BILITOT, PROT, ALBUMIN in the last 168 hours. No results for input(s): LIPASE, AMYLASE in the last 168 hours. No results for input(s): AMMONIA in the last 168 hours. CBC:  Recent Labs Lab 06/15/16 1142  WBC 8.5  HGB 9.5*  HCT 32.0*  MCV 76.7*  PLT 314   Cardiac Enzymes: No results for input(s): CKTOTAL, CKMB, CKMBINDEX, TROPONINI in the last 168 hours. BNP: Invalid input(s): POCBNP CBG: No results for input(s): GLUCAP in the last 168 hours.  Radiological Exams on Admission:  No results found.  *I have personally reviewed the images above*  EKG: Independently reviewed. Rate 85, normal sinus rhythm, no acute ST-T wave changes suggestive of ischemia  Assessment/Plan Principal Problem:   Syncope; possible vasovagal episode however patient also has significant cardiac history. Patient is also going through extreme stressful period at home   -  admit for observation, telemetry, serial cardiac enzymes  - Orthostatic vitals, place on gentle hydration  - 2-D echocardiogram  - BP was in 90s in ED, for now continue on the metoprolol and hold off on Norvasc and Imdur, gradually add other outpatient antihypertensives   Active Problems:   Essential hypertension, benign - Obtain orthostatic vitals, for now only continue metoprolol and add other outpatient antihypertensives gradually - Continue gentle hydration    Anemia - Obtain stool occult test, anemia panel - Patient has history of chronic anemia    CAD (coronary artery disease), status post CABG 4v - Continue aspirin, statin, beta blocker    GERD (gastroesophageal reflux disease) - Continue PPI    AKI (acute kidney injury) ([place]) - Placed on  gentle hydration    DVT prophylaxis: SCD's  CODE STATUS: full  Consults called: none   Family Communication: Admission, patients condition and plan of care including tests being ordered have been discussed with the patient and husband who indicates understanding and agree with the plan and Code Status  Admission status: obs, tele  Disposition plan: Further plan will depend as patient's clinical course evolves and further radiologic and laboratory data become available. Likely home When ready  At the time of admission, it appears that the appropriate admission status for this patient is Observation . This is judged to be reasonable and necessary in order to provide the required intensity of service to ensure the patient's safety given the presenting symptoms, physical exam findings, and initial radiographic and laboratory data in the context of their chronic comorbidities.     Time Spent on Admission: 20mins     Cathryne Mancebo M.D. [facility] 06/15/2016, 5:54 PM Pager: [phone]  If 7PM-7AM, please contact night-coverage [email or web address] Password [email or web address]

## 2016-06-15 NOTE — ED Triage Notes (Signed)
Per EMS: pt from work with syncope today; co worker found her on the floor; pt is unsure of event other than felt nausea; pt denies complaint at present

## 2016-06-15 NOTE — ED Provider Notes (Signed)
[facility] DEPT Provider Note   CSN: [id number] Arrival date & time: 06/15/16  1138     History   Chief Complaint Chief Complaint  Patient presents with  . Loss of Consciousness    HPI Debbie Delacruz is a 71 y.o. female.  HPI The patient is a 71 year old female with a past medical history of anemia, coronary artery disease status post stent 2 and coronary artery bypass graft to 4 vessels, hypertension, hyperlipidemia and dysrhythmia. Presents with loss of consciousness. This is the third episode of loss of consciousness the patient has experienced. The most recent one was approximately 2 years prior which led to her having a coronary artery bypass graft. She states today she was walking down the hall at work and began to feel lightheaded and dizzy. The next thing she remembers is being with the EMS. She has no further recollection of the event. She is unclear if she hit her head but denies headache. She has mild neck pain but has no other pain complaints on examination. She also states that she has been under extreme amounts of stress lately at work which has gotten acutely worse with the passing of her daughter-in-law by a drug overdose on Friday of last week. She was in her usual state of health until this episode today. She denies chest pain or shortness of breath. She denies nausea, vomiting or abdominal pain. She denies polyuria or dysuria. She denies diarrhea or constipation. She denies fevers or chills. She has no additional acute complaints or concerns at today's visit. Past Medical History:  Diagnosis Date  . Anemia   . Anginal pain ([place])   . Anxiety   . Arthritis   . CAD (coronary artery disease)   . Carpal tunnel syndrome    bilateral  . Cataract    bil removed cataracts  . Coronary artery disease    2x stents, Dr. Clayborn Bigness  . Depression   . Dysrhythmia   . Family history of adverse reaction to anesthesia    "brother; S/P lipotripsy in [place];  transferred to [facility]; had to put him on life support for 2 days"  . GERD (gastroesophageal reflux disease)   . Headache    "weekly" (09/13/2014)  . History of blood transfusion 05/2014   "we haven't figured out why I needed it yet" (09/13/2014)  . History of hiatal hernia   . Hyperglycemia   . Hyperlipidemia   . Hypertension   . Myocardial infarct   . Myocardial infarct, old 1999   15 years ago  . Obesity   . PONV (postoperative nausea and vomiting)   . S/P CABG x 4 09/18/2014   LIMA to LAD, SVG to PDA-dRCA sequentially, SVG to OM, EVH via right thigh   . Sleep apnea   . Sleep apnea    "don't use a mask" (09/13/2014)  . Unstable angina [facility])     Patient Active Problem List   Diagnosis Date Noted  . Bronchitis 05/27/2016  . Breast lump 11/22/2015  . Contact with and (suspected) exposure to other bacterial communicable diseases 10/09/2015  . Depression 10/09/2015  . S/P CABG x 4 09/18/2014  . Barrett's esophagus 09/17/2014  . Duodenitis 09/17/2014  . History of gastritis   . GERD (gastroesophageal reflux disease)   . CAD (coronary artery disease)   . Medicare annual wellness visit, initial 03/28/2013  . Essential hypertension, benign 11/16/2012  . Anemia 11/16/2012  . Elevated blood sugar 11/16/2012  . Arthralgia 11/16/2012  .  Screening for breast cancer 11/16/2012  . Special screening for malignant neoplasms, colon 11/16/2012  . Coronary artery disease 11/16/2012  . Dyslipidemia 09/15/2008  . COLONIC POLYPS, ADENOMATOUS, HX OF 02/20/2008  . ANXIETY 11/04/2006    Past Surgical History:  Procedure Laterality Date  . ABDOMINAL HYSTERECTOMY    . APPENDECTOMY    . BACK SURGERY    . BLADDER SURGERY    . CARDIAC CATHETERIZATION  06/2014  . CARPAL TUNNEL RELEASE Left   . CATARACT EXTRACTION W/ INTRAOCULAR LENS  IMPLANT, BILATERAL    . CHOLECYSTECTOMY    . COLONOSCOPY    . CORONARY ANGIOPLASTY WITH STENT PLACEMENT  1999   [facility] x2 stent  . CORONARY ARTERY BYPASS  GRAFT N/A 09/18/2014   Procedure: CORONARY ARTERY BYPASS GRAFTING (CABG)times four using LIMA  to LAD:SVG to  PD and DIST RCA;SVG to OM;, EVH Right thigh;  Surgeon: [name], MD;  Location: [facility];  Service: Open Heart Surgery;  Laterality: N/A;  . ESOPHAGOGASTRODUODENOSCOPY N/A 09/17/2014   Procedure: ESOPHAGOGASTRODUODENOSCOPY (EGD);  Surgeon: Inda Castle, MD;  Location: [facility];  Service: Endoscopy;  Laterality: N/A;  . heart stents     1999  . INCONTINENCE SURGERY    . INGUINAL HERNIA REPAIR    . INGUINAL HERNIA REPAIR Right X 3   "last one was in the 1990's; still have hernia there now" (09/13/2014)  . LAPAROSCOPIC CHOLECYSTECTOMY    . LUMBAR DISC SURGERY  1990's?  . LUMBAR SPINE SURGERY    . removal of ovaries    . TEE WITHOUT CARDIOVERSION N/A 09/18/2014   Procedure: TRANSESOPHAGEAL ECHOCARDIOGRAM (TEE);  Surgeon: [name], MD;  Location: [facility];  Service: Open Heart Surgery;  Laterality: N/A;  . UPPER GASTROINTESTINAL ENDOSCOPY    . VAGINAL HYSTERECTOMY      OB History    Gravida Para Term Preterm AB Living   0 0 0 0 0     SAB TAB Ectopic Multiple Live Births   0 0 0           Home Medications    Prior to Admission medications   Medication Sig Start Date End Date Taking? Authorizing Provider  acetaminophen (TYLENOL) 500 MG tablet Take 500 mg by mouth every 6 (six) hours as needed for headache.   Yes Historical Provider, MD  albuterol (PROVENTIL HFA;VENTOLIN HFA) 108 (90 Base) MCG/ACT inhaler Inhale 2 puffs into the lungs every 6 (six) hours as needed for wheezing or shortness of breath. 05/30/16  Yes Merlyn Lot, MD  amLODipine (NORVASC) 10 MG tablet TAKE 1 TABLET BY MOUTH ONCE DAILY. 12/04/15  Yes [name], MD  aspirin 325 MG tablet Take 325 mg by mouth daily.   Yes Historical Provider, MD  benzonatate (TESSALON) 200 MG capsule Take 1 capsule (200 mg total) by mouth 2 (two) times daily as needed for cough. 05/27/16  Yes [name], MD    isosorbide mononitrate (IMDUR) 60 MG 24 hr tablet Take 60 mg by mouth daily.   Yes Historical Provider, MD  metoprolol succinate (TOPROL-XL) 50 MG 24 hr tablet Take 1 tablet (50 mg total) by mouth daily. Take with or immediately following a meal. 05/15/16  Yes [name], MD  pantoprazole (PROTONIX) 40 MG tablet Take 1 tablet (40 mg total) by mouth 2 (two) times daily. 09/26/15  Yes Ladene Artist, MD  rosuvastatin (CRESTOR) 10 MG tablet Take 1 tablet (10 mg total) by mouth daily. 10/16/15  Yes  [name], MD  sertraline (ZOLOFT) 50 MG tablet Take 1 tablet (50 mg total) by mouth daily. 01/07/16  Yes [name], MD  Throat Lozenges (COUGH DROPS MT) Use as directed 1 lozenge in the mouth or throat daily as needed (COUGH).   Yes Historical Provider, MD  ferrous sulfate 325 (65 FE) MG tablet Take 1 tablet (325 mg total) by mouth daily with breakfast. 06/10/16   [name], MD  HYDROcodone-homatropine [facility]) 5-1.5 MG/5ML syrup Take 5 mLs by mouth every 8 (eight) hours as needed for cough. 06/08/16   [name], MD  predniSONE (DELTASONE) 10 MG tablet Please take 40 mg (4 tablets) by mouth daily for 4 days, then decrease by 1 tablet daily until gone Patient not taking: Reported on 06/15/2016 06/08/16   [name], MD    Family History Family History  Problem Relation Age of Onset  . Coronary artery disease Mother   . Osteoporosis Mother   . Heart disease Mother   . Stroke Mother   . Heart attack Mother   . Hypertension Mother   . Hypertension Father   . Coronary artery disease Father   . COPD Father   . Heart disease Father   . Heart attack Father   . Glaucoma Father   . Osteoporosis Father   . Emphysema Father   . Cancer Sister 81    colon  . Hypertension Sister   . Colon cancer Sister     dx in her 47's  . Breast cancer Sister   . Stroke Brother   . Hypertension Brother   . Stroke Maternal Grandfather   . Stroke Maternal Grandmother   .  Osteoporosis Maternal Grandmother   . Hypertension Maternal Grandmother   . Hypertension Paternal Grandmother   . Hypertension Paternal Grandfather   . Breast cancer Maternal Aunt     2 aunts-50's  . Esophageal cancer Neg Hx   . Stomach cancer Neg Hx   . Rectal cancer Neg Hx   . Pancreatic cancer Neg Hx     Social History Social History  Substance Use Topics  . Smoking status: Never Smoker  . Smokeless tobacco: Never Used  . Alcohol use 0.0 oz/week     Comment: 09/13/2014 "might have a drink a couple times/yr"     Allergies   Ferrous sulfate [ferrous fumarate] and Lipitor [atorvastatin]   Review of Systems Review of Systems  All other systems reviewed and are negative.    Physical Exam Updated Vital Signs BP 133/67 (BP Location: Left Arm)   Pulse 90   Temp 98 F (36.7 C) (Oral)   Resp 16   Ht 5' 2.5" (1.588 m)   Wt 84.8 kg   SpO2 100%   BMI 33.66 kg/m   Physical Exam  Constitutional: She is oriented to person, place, and time. She appears well-developed and well-nourished.  HENT:  Head: Normocephalic and atraumatic.  Eyes: Pupils are equal, round, and reactive to light.  Cardiovascular: Normal rate and regular rhythm.  Exam reveals no gallop and no friction rub.   No murmur heard. Pulmonary/Chest: Effort normal and breath sounds normal. No respiratory distress. She has no wheezes.  Abdominal: Soft. Bowel sounds are normal. She exhibits no distension. There is no tenderness.  Musculoskeletal: She exhibits no edema.  Neurological: She is alert and oriented to person, place, and time.  No cranial nerve deficit appreciated. Strength 5 out of 5 in the upper and lower extremities bilaterally. Normal  neurological examination.     ED Treatments / Results  Labs (all labs ordered are listed, but only abnormal results are displayed) Labs Reviewed  BASIC METABOLIC PANEL - Abnormal; Notable for the following:       Result Value   Glucose, Bld 123 (*)    BUN 26 (*)     Creatinine, Ser 1.17 (*)    Calcium 8.8 (*)    GFR calc non Af Amer 47 (*)    GFR calc Af Amer 54 (*)    All other components within normal limits  CBC - Abnormal; Notable for the following:    Hemoglobin 9.5 (*)    HCT 32.0 (*)    MCV 76.7 (*)    MCH 22.8 (*)    MCHC 29.7 (*)    RDW 18.1 (*)    All other components within normal limits  URINALYSIS, ROUTINE W REFLEX MICROSCOPIC  OCCULT BLOOD X 1 CARD TO LAB, STOOL  I-STAT TROPOININ, ED    EKG  EKG Interpretation  Date/Time:  Monday June 15 2016 11:43:13 EST Ventricular Rate:  85 PR Interval:  114 QRS Duration: 80 QT Interval:  386 QTC Calculation: 459 R Axis:   20 Text Interpretation:  Normal sinus rhythm Cannot rule out Anterior infarct , age undetermined Abnormal ECG Confirmed by RAY MD, Andee Poles [phone]) on 06/15/2016 3:18:38 PM       Radiology No results found.  Procedures Procedures (including critical care time)  Medications Ordered in ED Medications  sodium chloride 0.9 % bolus 1,000 mL (not administered)     Initial Impression / Assessment and Plan / ED Course  I have reviewed the triage vital signs and the nursing notes.  Pertinent labs & imaging results that were available during my care of the patient were reviewed by me and considered in my medical decision making (see chart for details).  Patient presents after episode of syncope. Review of systems is negative. Physical examination is unremarkable. Neurological examination is unremarkable. Patient has an extensive cardiac history including stent and coronary artery bypass graft. Her last episode of syncope the patient was admitted and required coronary artery bypass graft. Initial laboratory evaluation reveals an acute kidney injury with a creatinine of 1.17 and anemia with a hemoglobin of 9.5. Previous hemoglobin of 12.1 approximately 2 weeks earlier. BUN elevated at 26. Urinalysis was not collected. Troponin pending. EKG without acute ST segment  elevation. Currently, the differential diagnosis includes cardiac arrhythmia, seizure, orthostatic hypotension, pulmonary embolism, stress, symptomatic anemia or other potential etiology. Also, given the patient's elevated BUN and precipitous drop in hemoglobin over the prior 2 weeks the patient may have an upper GI bleed which would need further evaluation once in the inpatient setting. Currently, the patient is afebrile and hemodynamically stable. I spoke with the admitting physician and she will be admitted for syncope workup to triad hospitalist.  Final Clinical Impressions(s) / ED Diagnoses   Final diagnoses:  AKI (acute kidney injury) ([place])  Syncope, unspecified syncope type    New Prescriptions New Prescriptions   No medications on file     Ophelia Shoulder, MD 06/15/16 [place], MD 06/16/16 1252

## 2016-06-15 NOTE — ED Notes (Signed)
IV attempted x's 3 without success 

## 2016-06-16 ENCOUNTER — Observation Stay (HOSPITAL_BASED_OUTPATIENT_CLINIC_OR_DEPARTMENT_OTHER): Payer: Medicare Other

## 2016-06-16 ENCOUNTER — Encounter (HOSPITAL_COMMUNITY)
Admission: EM | Disposition: A | Discharge status: Home / Self Care | Payer: Self-pay | Source: Home / Self Care | Attending: Emergency Medicine

## 2016-06-16 ENCOUNTER — Encounter (HOSPITAL_COMMUNITY): Payer: Self-pay | Admitting: General Practice

## 2016-06-16 ENCOUNTER — Observation Stay (HOSPITAL_COMMUNITY): Payer: Medicare Other

## 2016-06-16 DIAGNOSIS — K210 Gastro-esophageal reflux disease with esophagitis: Secondary | ICD-10-CM

## 2016-06-16 DIAGNOSIS — I214 Non-ST elevation (NSTEMI) myocardial infarction: Secondary | ICD-10-CM

## 2016-06-16 DIAGNOSIS — I25710 Atherosclerosis of autologous vein coronary artery bypass graft(s) with unstable angina pectoris: Secondary | ICD-10-CM | POA: Diagnosis not present

## 2016-06-16 DIAGNOSIS — I2510 Atherosclerotic heart disease of native coronary artery without angina pectoris: Secondary | ICD-10-CM

## 2016-06-16 DIAGNOSIS — I10 Essential (primary) hypertension: Secondary | ICD-10-CM | POA: Diagnosis not present

## 2016-06-16 DIAGNOSIS — R55 Syncope and collapse: Secondary | ICD-10-CM | POA: Diagnosis not present

## 2016-06-16 DIAGNOSIS — Z951 Presence of aortocoronary bypass graft: Secondary | ICD-10-CM

## 2016-06-16 DIAGNOSIS — N179 Acute kidney failure, unspecified: Secondary | ICD-10-CM | POA: Diagnosis not present

## 2016-06-16 DIAGNOSIS — I252 Old myocardial infarction: Secondary | ICD-10-CM | POA: Diagnosis not present

## 2016-06-16 HISTORY — PX: CARDIAC CATHETERIZATION: SHX172

## 2016-06-16 LAB — TROPONIN I
TROPONIN I: 1.38 ng/mL — AB (ref <0.03)
Troponin I: 4.37 ng/mL (ref <0.03)
Troponin I: 4.5 ng/mL (ref <0.03)
Troponin I: 5.19 ng/mL (ref <0.03)

## 2016-06-16 LAB — BASIC METABOLIC PANEL
ANION GAP: 8 (ref 5–15)
BUN: 24 mg/dL — ABNORMAL HIGH (ref 6–20)
CALCIUM: 8.1 mg/dL — AB (ref 8.9–10.3)
CHLORIDE: 106 mmol/L (ref 101–111)
CO2: 23 mmol/L (ref 22–32)
Creatinine, Ser: 0.99 mg/dL (ref 0.44–1.00)
GFR calc non Af Amer: 57 mL/min — ABNORMAL LOW (ref >60)
Glucose, Bld: 122 mg/dL — ABNORMAL HIGH (ref 65–99)
POTASSIUM: 4 mmol/L (ref 3.5–5.1)
Sodium: 137 mmol/L (ref 135–145)

## 2016-06-16 LAB — CBC
HEMATOCRIT: 28.5 % — AB (ref 36.0–46.0)
HEMOGLOBIN: 8.6 g/dL — AB (ref 12.0–15.0)
MCH: 23.1 pg — AB (ref 26.0–34.0)
MCHC: 30.2 g/dL (ref 30.0–36.0)
MCV: 76.4 fL — AB (ref 78.0–100.0)
Platelets: 265 10*3/uL (ref 150–400)
RBC: 3.73 MIL/uL — AB (ref 3.87–5.11)
RDW: 18.3 % — ABNORMAL HIGH (ref 11.5–15.5)
WBC: 9.6 10*3/uL (ref 4.0–10.5)

## 2016-06-16 LAB — PROTIME-INR
INR: 1.22
PROTHROMBIN TIME: 15.4 s — AB (ref 11.4–15.2)

## 2016-06-16 LAB — IRON AND TIBC
Iron: 25 ug/dL — ABNORMAL LOW (ref 28–170)
SATURATION RATIOS: 6 % — AB (ref 10.4–31.8)
TIBC: 399 ug/dL (ref 250–450)
UIBC: 374 ug/dL

## 2016-06-16 LAB — ECHOCARDIOGRAM COMPLETE
Height: 62.5 in
Weight: 3006.4 oz

## 2016-06-16 LAB — FOLATE: Folate: 12.2 ng/mL (ref >5.9)

## 2016-06-16 LAB — GLUCOSE, CAPILLARY: Glucose-Capillary: 94 mg/dL (ref 65–99)

## 2016-06-16 LAB — RETICULOCYTES
RBC.: 3.72 MIL/uL — AB (ref 3.87–5.11)
RETIC CT PCT: 3 % (ref 0.4–3.1)
Retic Count, Absolute: 111.6 10*3/uL (ref 19.0–186.0)

## 2016-06-16 LAB — VITAMIN B12: Vitamin B-12: 182 pg/mL (ref 180–914)

## 2016-06-16 LAB — FERRITIN: FERRITIN: 6 ng/mL — AB (ref 11–307)

## 2016-06-16 SURGERY — LEFT HEART CATH AND CORS/GRAFTS ANGIOGRAPHY

## 2016-06-16 MED ORDER — LIDOCAINE HCL (PF) 1 % IJ SOLN
INTRAMUSCULAR | Status: DC | PRN
  Administered 2016-06-16: 2 mL via INTRADERMAL

## 2016-06-16 MED ORDER — SODIUM CHLORIDE 0.9% FLUSH: 3.0000 mL | INTRAVENOUS | Status: DC | PRN

## 2016-06-16 MED ORDER — ACETAMINOPHEN 325 MG PO TABS
650.0000 mg | ORAL_TABLET | Freq: Four times a day (QID) | ORAL | Status: DC | PRN
Start: 2016-06-16 — End: 2016-06-17

## 2016-06-16 MED ORDER — SODIUM CHLORIDE 0.9 % IV SOLN: INTRAVENOUS | Status: DC

## 2016-06-16 MED ORDER — MIDAZOLAM HCL 2 MG/2ML IJ SOLN
INTRAMUSCULAR | Status: AC
  Filled 2016-06-16: qty 2

## 2016-06-16 MED ORDER — HEPARIN (PORCINE) IN NACL 2-0.9 UNIT/ML-% IJ SOLN
INTRAMUSCULAR | Status: AC
  Filled 2016-06-16: qty 500

## 2016-06-16 MED ORDER — ROSUVASTATIN CALCIUM 10 MG PO TABS
10.0000 mg | ORAL_TABLET | Freq: Every day | ORAL | Status: DC
  Administered 2016-06-16 – 2016-06-17 (×2): 10 mg via ORAL
  Filled 2016-06-16 (×2): qty 1

## 2016-06-16 MED ORDER — VERAPAMIL HCL 2.5 MG/ML IV SOLN
INTRAVENOUS | Status: AC
  Filled 2016-06-16: qty 2

## 2016-06-16 MED ORDER — ACETAMINOPHEN 650 MG RE SUPP: 650.0000 mg | Freq: Four times a day (QID) | RECTAL | Status: DC | PRN

## 2016-06-16 MED ORDER — HEPARIN BOLUS VIA INFUSION
4000.0000 [IU] | Freq: Once | INTRAVENOUS | Status: AC
  Administered 2016-06-16: 4000 [IU] via INTRAVENOUS
  Filled 2016-06-16: qty 4000

## 2016-06-16 MED ORDER — PANTOPRAZOLE SODIUM 40 MG PO TBEC
40.0000 mg | DELAYED_RELEASE_TABLET | Freq: Two times a day (BID) | ORAL | Status: DC
  Administered 2016-06-16 – 2016-06-17 (×3): 40 mg via ORAL
  Filled 2016-06-16 (×3): qty 1

## 2016-06-16 MED ORDER — BENZONATATE 100 MG PO CAPS: 200.0000 mg | ORAL_CAPSULE | Freq: Two times a day (BID) | ORAL | Status: DC | PRN

## 2016-06-16 MED ORDER — SODIUM CHLORIDE 0.9% FLUSH: 3.0000 mL | Freq: Two times a day (BID) | INTRAVENOUS | Status: DC

## 2016-06-16 MED ORDER — SODIUM CHLORIDE 0.9 % IV SOLN
INTRAVENOUS | Status: DC
  Administered 2016-06-16: 100 mL/h via INTRAVENOUS
  Administered 2016-06-16: 12:00:00 via INTRAVENOUS

## 2016-06-16 MED ORDER — HEPARIN (PORCINE) IN NACL 2-0.9 UNIT/ML-% IJ SOLN
INTRAMUSCULAR | Status: DC | PRN
  Administered 2016-06-16: 1500 mL via INTRA_ARTERIAL

## 2016-06-16 MED ORDER — HEPARIN (PORCINE) IN NACL 100-0.45 UNIT/ML-% IJ SOLN
950.0000 [IU]/h | INTRAMUSCULAR | Status: DC
  Administered 2016-06-16: 950 [IU]/h via INTRAVENOUS
  Filled 2016-06-16: qty 250

## 2016-06-16 MED ORDER — ASPIRIN 81 MG PO CHEW: 81.0000 mg | CHEWABLE_TABLET | ORAL | Status: DC

## 2016-06-16 MED ORDER — SODIUM CHLORIDE 0.9 % IV SOLN: 250.0000 mL | INTRAVENOUS | Status: DC | PRN

## 2016-06-16 MED ORDER — SODIUM CHLORIDE 0.9 % IV SOLN
INTRAVENOUS | Status: AC
  Administered 2016-06-16: 17:00:00 via INTRAVENOUS

## 2016-06-16 MED ORDER — ALBUTEROL SULFATE (2.5 MG/3ML) 0.083% IN NEBU: 3.0000 mL | INHALATION_SOLUTION | Freq: Four times a day (QID) | RESPIRATORY_TRACT | Status: DC | PRN

## 2016-06-16 MED ORDER — IOPAMIDOL (ISOVUE-370) INJECTION 76%
INTRAVENOUS | Status: AC
Start: 2016-06-16 — End: ?
  Filled 2016-06-16: qty 100

## 2016-06-16 MED ORDER — SERTRALINE HCL 50 MG PO TABS
50.0000 mg | ORAL_TABLET | Freq: Every day | ORAL | Status: DC
  Administered 2016-06-16 – 2016-06-17 (×2): 50 mg via ORAL
  Filled 2016-06-16 (×2): qty 1

## 2016-06-16 MED ORDER — HEPARIN SODIUM (PORCINE) 1000 UNIT/ML IJ SOLN
INTRAMUSCULAR | Status: DC | PRN
  Administered 2016-06-16: 4500 [IU] via INTRAVENOUS

## 2016-06-16 MED ORDER — ASPIRIN 325 MG PO TABS
325.0000 mg | ORAL_TABLET | Freq: Every day | ORAL | Status: DC
  Administered 2016-06-16 – 2016-06-17 (×2): 325 mg via ORAL
  Filled 2016-06-16 (×2): qty 1

## 2016-06-16 MED ORDER — IOPAMIDOL (ISOVUE-370) INJECTION 76%
INTRAVENOUS | Status: DC | PRN
  Administered 2016-06-16: 95 mL via INTRA_ARTERIAL

## 2016-06-16 MED ORDER — VERAPAMIL HCL 2.5 MG/ML IV SOLN
INTRAVENOUS | Status: DC | PRN
  Administered 2016-06-16: 10 mL via INTRA_ARTERIAL

## 2016-06-16 MED ORDER — FERROUS SULFATE 325 (65 FE) MG PO TABS
325.0000 mg | ORAL_TABLET | Freq: Every day | ORAL | Status: DC
  Filled 2016-06-16: qty 1

## 2016-06-16 MED ORDER — FENTANYL CITRATE (PF) 100 MCG/2ML IJ SOLN
INTRAMUSCULAR | Status: AC
  Filled 2016-06-16: qty 2

## 2016-06-16 MED ORDER — MIDAZOLAM HCL 2 MG/2ML IJ SOLN
INTRAMUSCULAR | Status: DC | PRN
  Administered 2016-06-16 (×2): 1 mg via INTRAVENOUS

## 2016-06-16 MED ORDER — METOPROLOL SUCCINATE ER 50 MG PO TB24
50.0000 mg | ORAL_TABLET | Freq: Every day | ORAL | Status: DC
  Administered 2016-06-16 – 2016-06-17 (×2): 50 mg via ORAL
  Filled 2016-06-16 (×2): qty 1

## 2016-06-16 MED ORDER — ACETAMINOPHEN 500 MG PO TABS: 500.0000 mg | ORAL_TABLET | Freq: Four times a day (QID) | ORAL | Status: DC | PRN

## 2016-06-16 MED ORDER — LIDOCAINE HCL (PF) 1 % IJ SOLN
INTRAMUSCULAR | Status: AC
  Filled 2016-06-16: qty 30

## 2016-06-16 MED ORDER — SODIUM CHLORIDE 0.9% FLUSH
3.0000 mL | Freq: Two times a day (BID) | INTRAVENOUS | Status: DC
  Administered 2016-06-16 (×2): 3 mL via INTRAVENOUS

## 2016-06-16 MED ORDER — FENTANYL CITRATE (PF) 100 MCG/2ML IJ SOLN
INTRAMUSCULAR | Status: DC | PRN
  Administered 2016-06-16 (×2): 25 ug via INTRAVENOUS

## 2016-06-16 MED ORDER — HEPARIN SODIUM (PORCINE) 1000 UNIT/ML IJ SOLN
INTRAMUSCULAR | Status: AC
  Filled 2016-06-16: qty 1

## 2016-06-16 SURGICAL SUPPLY — 10 items
CATH INFINITI 5FR MULTPACK ANG (CATHETERS) ×3 IMPLANT
DEVICE RAD COMP TR BAND LRG (VASCULAR PRODUCTS) ×3 IMPLANT
GLIDESHEATH SLEND SS 6F .021 (SHEATH) ×3 IMPLANT
GUIDEWIRE INQWIRE 1.5J.035X260 (WIRE) ×1 IMPLANT
INQWIRE 1.5J .035X260CM (WIRE) ×3
KIT HEART LEFT (KITS) ×3 IMPLANT
PACK CARDIAC CATHETERIZATION (CUSTOM PROCEDURE TRAY) ×3 IMPLANT
SYR MEDRAD MARK V 150ML (SYRINGE) IMPLANT
TRANSDUCER W/STOPCOCK (MISCELLANEOUS) ×3 IMPLANT
TUBING CIL FLEX 10 FLL-RA (TUBING) ×3 IMPLANT

## 2016-06-16 NOTE — Progress Notes (Signed)
Notified MD of Troponin 4.5, MD called back stating she will contact cardiology ASAP.  Patient totally asymptomatic, no chest pain, no nausea, no diaphoresis, no shortness of breath.

## 2016-06-16 NOTE — Progress Notes (Signed)
Pt. With critical troponin of 1.38. On call for [facility] paged to make aware. RN will continue to monitor pt.

## 2016-06-16 NOTE — Interval H&P Note (Signed)
History and Physical Interval Note:  06/16/2016 3:43 PM  Debbie Delacruz  has presented today for cardiac cath with the diagnosis of nstemi  The various methods of treatment have been discussed with the patient and family. After consideration of risks, benefits and other options for treatment, the patient has consented to  Procedure(s): Left Heart Cath and Cors/Grafts Angiography (N/A) as a surgical intervention .  The patient's history has been reviewed, patient examined, no change in status, stable for surgery.  I have reviewed the patient's chart and labs.  Questions were answered to the patient's satisfaction.    Cath Lab Visit (complete for each Cath Lab visit)  Clinical Evaluation Leading to the Procedure:   ACS: Yes.    Non-ACS:    Anginal Classification: CCS II  Anti-ischemic medical therapy: Minimal Therapy (1 class of medications)  Non-Invasive Test Results: No non-invasive testing performed  Prior CABG: Previous CABG         Debbie Delacruz

## 2016-06-16 NOTE — Progress Notes (Signed)
TR band completely deflated, level 0.  Patient continues with no complaints, sitting up eating her supper.

## 2016-06-16 NOTE — Progress Notes (Signed)
  Echocardiogram 2D Echocardiogram has been performed.  Diamond Nickel 06/16/2016, 10:58 AM

## 2016-06-16 NOTE — Consult Note (Signed)
Patient ID: Debbie Delacruz MRN: [id number], DOB/AGE: June 16, 1945   Admit date: 06/15/2016   Reason for Consult: NSTEMI Requesting MD: Dr. Tana Coast, Internal Medicine    Primary Physician: Tommi Rumps, MD Primary Cardiologist: Dr. Clayborn Bigness, [place]   Pt. Profile:  71 year old female with history of chronic anemia, coronary artery disease status post stent 2, CABG x4v in 2016, hypertension and hyperlipidemia, admitted for syncope and found to have elevated troponins c/w NSTEMI.   Problem List  Past Medical History:  Diagnosis Date  . Anemia   . Anginal pain ([place])   . Anxiety   . Arthritis   . CAD (coronary artery disease)   . Carpal tunnel syndrome    bilateral  . Cataract    bil removed cataracts  . Coronary artery disease    2x stents, Dr. Clayborn Bigness  . Depression   . Dysrhythmia   . Family history of adverse reaction to anesthesia    "brother; S/P lipotripsy in [place]; transferred to [facility]; had to put him on life support for 2 days"  . GERD (gastroesophageal reflux disease)   . Headache    "weekly" (09/13/2014)  . History of blood transfusion 05/2014   "we haven't figured out why I needed it yet" (09/13/2014)  . History of hiatal hernia   . Hyperglycemia   . Hyperlipidemia   . Hypertension   . Myocardial infarct   . Myocardial infarct, old 1999   15 years ago  . Obesity   . PONV (postoperative nausea and vomiting)   . S/P CABG x 4 09/18/2014   LIMA to LAD, SVG to PDA-dRCA sequentially, SVG to OM, EVH via right thigh   . Sleep apnea   . Sleep apnea    "don't use a mask" (09/13/2014)  . Unstable angina [facility])     Past Surgical History:  Procedure Laterality Date  . ABDOMINAL HYSTERECTOMY    . APPENDECTOMY    . BACK SURGERY    . BLADDER SURGERY    . CARDIAC CATHETERIZATION  06/2014  . CARPAL TUNNEL RELEASE Left   . CATARACT EXTRACTION W/ INTRAOCULAR LENS  IMPLANT, BILATERAL    . CHOLECYSTECTOMY    . COLONOSCOPY    . CORONARY ANGIOPLASTY  WITH STENT PLACEMENT  1999   [facility] x2 stent  . CORONARY ARTERY BYPASS GRAFT N/A 09/18/2014   Procedure: CORONARY ARTERY BYPASS GRAFTING (CABG)times four using LIMA  to LAD:SVG to  PD and DIST RCA;SVG to OM;, EVH Right thigh;  Surgeon: [name], MD;  Location: [place];  Service: Open Heart Surgery;  Laterality: N/A;  . ESOPHAGOGASTRODUODENOSCOPY N/A 09/17/2014   Procedure: ESOPHAGOGASTRODUODENOSCOPY (EGD);  Surgeon: Inda Castle, MD;  Location: [place];  Service: Endoscopy;  Laterality: N/A;  . heart stents     1999  . INCONTINENCE SURGERY    . INGUINAL HERNIA REPAIR    . INGUINAL HERNIA REPAIR Right X 3   "last one was in the 1990's; still have hernia there now" (09/13/2014)  . LAPAROSCOPIC CHOLECYSTECTOMY    . LUMBAR DISC SURGERY  1990's?  . LUMBAR SPINE SURGERY    . removal of ovaries    . TEE WITHOUT CARDIOVERSION N/A 09/18/2014   Procedure: TRANSESOPHAGEAL ECHOCARDIOGRAM (TEE);  Surgeon: [name], MD;  Location: [place];  Service: Open Heart Surgery;  Laterality: N/A;  . UPPER GASTROINTESTINAL ENDOSCOPY    . VAGINAL HYSTERECTOMY       Allergies  Allergies  Allergen Reactions  . Ferrous  Sulfate [Ferrous Fumarate] Other (See Comments)    STOMACH ISSUES  . Lipitor [Atorvastatin] Other (See Comments)    myalgia    HPI  71 year old female with history of chronic anemia, coronary artery disease status post stent 2, CABG x4v in 2016, hypertension and hyperlipidemia, admitted for syncope and found to have elevated troponins c/w NSTEMI. She is followed in [place] by Dr. Clayborn Bigness. Her CABG was performed by Dr. Roxy Manns 09/18/14 (LIMA-LAD, SVG-PDA, SVG-dRCA, SVG-OM of LCx). She has not had a repeat cath since then. She was recently diagnosed with bronchitis and treated with antibiotics and two rounds of prednisone. She and her family have also been under a great deal of emotional stress lately. Her daughter-in-law recently committed suicide a week ago. She is also the primary  caregiver of her 38 y/o grandson. She has been under a lot of grief.   Yesterday, she was feeling fine, until she had sudden syncope and collapse. She denies any prodrome. No associated CP, dyspnea or palpitations. She got up from a seated position and started walking in her home when she suddenly passed out. Family was present. The next thing she can recall,  EMS was surrounding her. She states she felt a bit diaphoretic but no chest pain. No head trauma and no neurological deficits.   She has been admitted by IM. Troponin's are abnormal with upward trend, c/w NSTEMI. 1.38>>4.50. She continues to deny chest pain and dyspnea. SCr is WNL at 0.99. Hgb 8.6. EKG shows NSR with nonspecific Twave abnormalities. BP is stable. She denies any recurrent syncope/ near syncope.    Home Medications  Prior to Admission medications   Medication Sig Start Date End Date Taking? Authorizing Provider  acetaminophen (TYLENOL) 500 MG tablet Take 500 mg by mouth every 6 (six) hours as needed for headache.   Yes Historical Provider, MD  albuterol (PROVENTIL HFA;VENTOLIN HFA) 108 (90 Base) MCG/ACT inhaler Inhale 2 puffs into the lungs every 6 (six) hours as needed for wheezing or shortness of breath. 05/30/16  Yes Merlyn Lot, MD  amLODipine (NORVASC) 10 MG tablet TAKE 1 TABLET BY MOUTH ONCE DAILY. 12/04/15  Yes [name], MD  aspirin 325 MG tablet Take 325 mg by mouth daily.   Yes Historical Provider, MD  benzonatate (TESSALON) 200 MG capsule Take 1 capsule (200 mg total) by mouth 2 (two) times daily as needed for cough. 05/27/16  Yes [name], MD  isosorbide mononitrate (IMDUR) 60 MG 24 hr tablet Take 60 mg by mouth daily.   Yes Historical Provider, MD  metoprolol succinate (TOPROL-XL) 50 MG 24 hr tablet Take 1 tablet (50 mg total) by mouth daily. Take with or immediately following a meal. 05/15/16  Yes [name], MD  pantoprazole (PROTONIX) 40 MG tablet Take 1 tablet (40 mg total) by mouth 2  (two) times daily. 09/26/15  Yes Ladene Artist, MD  rosuvastatin (CRESTOR) 10 MG tablet Take 1 tablet (10 mg total) by mouth daily. 10/16/15  Yes [name], MD  sertraline (ZOLOFT) 50 MG tablet Take 1 tablet (50 mg total) by mouth daily. 01/07/16  Yes [name], MD  Throat Lozenges (COUGH DROPS MT) Use as directed 1 lozenge in the mouth or throat daily as needed (COUGH).   Yes Historical Provider, MD  ferrous sulfate 325 (65 FE) MG tablet Take 1 tablet (325 mg total) by mouth daily with breakfast. 06/10/16   [name], MD  HYDROcodone-homatropine [facility]) 5-1.5 MG/5ML syrup Take 5  mLs by mouth every 8 (eight) hours as needed for cough. 06/08/16   [name], MD  predniSONE (DELTASONE) 10 MG tablet Please take 40 mg (4 tablets) by mouth daily for 4 days, then decrease by 1 tablet daily until gone Patient not taking: Reported on 06/15/2016 06/08/16   [name], [place] . aspirin  325 mg Oral Daily  . ferrous sulfate  325 mg Oral Q breakfast  . heparin  4,000 Units Intravenous Once  . metoprolol succinate  50 mg Oral Daily  . pantoprazole  40 mg Oral BID  . rosuvastatin  10 mg Oral Daily  . sertraline  50 mg Oral Daily  . sodium chloride flush  3 mL Intravenous Q12H   Family History  Family History  Problem Relation Age of Onset  . Coronary artery disease Mother   . Osteoporosis Mother   . Heart disease Mother   . Stroke Mother   . Heart attack Mother   . Hypertension Mother   . Hypertension Father   . Coronary artery disease Father   . COPD Father   . Heart disease Father   . Heart attack Father   . Glaucoma Father   . Osteoporosis Father   . Emphysema Father   . Cancer Sister 37    colon  . Hypertension Sister   . Colon cancer Sister     dx in her 30's  . Breast cancer Sister   . Stroke Brother   . Hypertension Brother   . Stroke Maternal Grandfather   . Stroke Maternal Grandmother   . Osteoporosis Maternal Grandmother   .  Hypertension Maternal Grandmother   . Hypertension Paternal Grandmother   . Hypertension Paternal Grandfather   . Breast cancer Maternal Aunt     2 aunts-50's  . Esophageal cancer Neg Hx   . Stomach cancer Neg Hx   . Rectal cancer Neg Hx   . Pancreatic cancer Neg Hx     Social History  Social History   Social History  . Marital status: Married    Spouse name: N/A  . Number of children: N/A  . Years of education: N/A   Occupational History  . Not on file.   Social History Main Topics  . Smoking status: Never Smoker  . Smokeless tobacco: Never Used  . Alcohol use 0.0 oz/week     Comment: 09/13/2014 "might have a drink a couple times/yr"  . Drug use: No  . Sexual activity: Not Currently   Other Topics Concern  . Not on file   Social History Narrative   ** Merged History Encounter **       Lives in [place] with 48 YO nephew and husband. 3 dogs outside.  Work - Doctor, general practice, [employer]  Diet - regular, limited meat  Exercise - walks some     Review of Systems General:  No chills, fever, night sweats or weight changes.  Cardiovascular:  No chest pain, dyspnea on exertion, edema, orthopnea, palpitations, paroxysmal nocturnal dyspnea. Dermatological: No rash, lesions/masses Respiratory: No cough, dyspnea Urologic: No hematuria, dysuria Abdominal:   No nausea, vomiting, diarrhea, bright red blood per rectum, melena, or hematemesis Neurologic:  No visual changes, wkns, changes in mental status. All other systems reviewed and are otherwise negative except as noted above.  Physical Exam  Blood pressure (!) 106/58, pulse 89, temperature 98.2 F (36.8 C), temperature source Oral, resp. rate 18, height 5' 2.5" (1.588 m), weight 187 lb  14.4 oz (85.2 kg), SpO2 98 %.  General: Pleasant, NAD Psych: Normal affect. Neuro: Alert and oriented X 3. Moves all extremities spontaneously. HEENT: Normal  Neck: Supple without bruits or JVD. Lungs:  Resp regular and  unlabored, CTA. Heart: RRR no s3, s4, or murmurs. Abdomen: Soft, non-tender, non-distended, BS + x 4.  Extremities: No clubbing, cyanosis or edema. DP/PT/Radials 2+ and equal bilaterally.  Labs  Troponin [facility] Care Test)  Recent Labs  06/15/16 1718  TROPIPOC 0.01    Recent Labs  06/16/16 0111 06/16/16 0715  TROPONINI 1.38* 4.50*  4.37*   Lab Results  Component Value Date   WBC 9.6 06/16/2016   HGB 8.6 (L) 06/16/2016   HCT 28.5 (L) 06/16/2016   MCV 76.4 (L) 06/16/2016   PLT 265 06/16/2016    Recent Labs Lab 06/16/16 0112  NA 137  K 4.0  CL 106  CO2 23  BUN 24*  CREATININE 0.99  CALCIUM 8.1*  GLUCOSE 122*   Lab Results  Component Value Date   CHOL 307 (H) 10/09/2015   HDL 37.30 (L) 10/09/2015   LDLCALC 121 (H) 09/14/2014   TRIG 231.0 (H) 10/09/2015   No results found for: DDIMER   Radiology/Studies  Dg Chest 2 View  Result Date: 05/30/2016 CLINICAL DATA:  71 y/o  F; cough. EXAM: CHEST  2 VIEW COMPARISON:  05/28/2015 chest radiograph FINDINGS: The heart size and mediastinal contours are within normal limits and stable. Both lungs are clear. Degenerative changes of thoracic spine. Median sternotomy wires are aligned. Aortic atherosclerosis with calcification. Right upper quadrant cholecystectomy clips. IMPRESSION: No active cardiopulmonary disease.  Aortic atherosclerosis. Electronically Signed   By: Kristine Garbe M.D.   On: 05/30/2016 05:09   Dg Chest 2 View  Result Date: 05/27/2016 CLINICAL DATA:  Cough and chills EXAM: CHEST  2 VIEW COMPARISON:  Chest radiograph March 07, 2015 FINDINGS: There is no edema or consolidation. Heart size and pulmonary vascularity are normal. No adenopathy. There is atherosclerotic calcification in the aorta. Patient is status post coronary artery bypass grafting. There is degenerative change in the thoracic spine. IMPRESSION: No edema or consolidation.  Aortic atherosclerosis. Electronically Signed   By: Lowella Grip III M.D.   On: 05/27/2016 16:54   Ct Head Wo Contrast  Result Date: 06/16/2016 CLINICAL DATA:  Initial evaluation for acute syncope. EXAM: CT HEAD WITHOUT CONTRAST TECHNIQUE: Contiguous axial images were obtained from the base of the skull through the vertex without intravenous contrast. COMPARISON:  Prior CT from 11/03/2013. FINDINGS: Brain: Age appropriate cerebral atrophy with mild chronic microvascular ischemic disease. No acute intracranial hemorrhage. No evidence for acute large vessel territory infarct. No mass lesion, midline shift, or mass effect. No hydrocephalus. No extra-axial fluid collection. Vascular: No hyperdense vessel. Scattered vascular calcifications noted within the carotid siphons. Skull: Scalp soft tissues within normal limits.  Calvarium intact. Sinuses/Orbits: Globes and orbits within normal limits. Patient is status post lens extraction bilaterally. Mild mucosal thickening within the maxillary sinuses and ethmoidal air cells. Paranasal sinuses are otherwise clear. Small right mastoid effusion noted. IMPRESSION: No acute intracranial process. Electronically Signed   By: Jeannine Boga M.D.   On: 06/16/2016 05:24    ECG  NSR with nonspecific Twave abnormalties    ASSESSMENT AND PLAN  72 year old female with history of chronic anemia, coronary artery disease status post stent 2, CABG x4v in 2016, hypertension and hyperlipidemia, admitted for syncope and found to have elevated troponins c/w NSTEMI. Troponin trend: 1.38>>4.50.  She denies CP. EKG w/o ischemic changes. However, given her history and degree of troponin elevation, we will start IV heparin and plan of LHC today to reassess status of native vessels and grafts. Given recent sudden death of her daughter-in-law, this may also be stressed induced MI/cardiomyopathy. We will continue to follow with further recommendations post cath.    Signed, Lyda Jester, PA-C 06/16/2016, 9:23 AM   Personally  seen and examined. Agree with above.  NSTEMI  - no CP, just syncope. ? Stress induced (recent suicide in family).  - Cath. Risks and benefits including CVA, MI, death, bleeding explained.   Syncope   - recent abx, bronchitis, pale, diaphoretic, ?vagal. Doubt PE (no CP, no LE pain, no edema).   CAD post CABG 2016  - been doing well up to now.   AAO x 3, RRR, skin dry. Lungs clear.   Candee Furbish, MD

## 2016-06-16 NOTE — Progress Notes (Signed)
ANTICOAGULATION CONSULT NOTE - Initial Consult  Pharmacy Consult for Heparin Indication: chest pain/ACS  Allergies  Allergen Reactions  . Ferrous Sulfate [Ferrous Fumarate] Other (See Comments)    STOMACH ISSUES  . Lipitor [Atorvastatin] Other (See Comments)    myalgia    Patient Measurements: Height: 5' 2.5" (158.8 cm) Weight: 187 lb 14.4 oz (85.2 kg) IBW/kg (Calculated) : 51.25 Heparin Dosing Weight: 70 kg  Vital Signs: Temp: 98.2 F (36.8 C) (01/23 0800) Temp Source: Oral (01/23 0800) BP: 106/58 (01/23 0800) Pulse Rate: 89 (01/23 0800)  Labs:  Recent Labs  06/15/16 1142 06/16/16 0111 06/16/16 0112 06/16/16 0715  HGB 9.5*  --  8.6*  --   HCT 32.0*  --  28.5*  --   PLT 314  --  265  --   CREATININE 1.17*  --  0.99  --   TROPONINI  --  1.38*  --  4.50*  4.37*    Estimated Creatinine Clearance: 55.7 mL/min (by C-G formula based on SCr of 0.99 mg/dL).   Medical History: Past Medical History:  Diagnosis Date  . Anemia   . Anginal pain ([place])   . Anxiety   . Arthritis   . CAD (coronary artery disease)   . Carpal tunnel syndrome    bilateral  . Cataract    bil removed cataracts  . Coronary artery disease    2x stents, Dr. Clayborn Bigness  . Depression   . Dysrhythmia   . Family history of adverse reaction to anesthesia    "brother; S/P lipotripsy in [place]; transferred to [facility]; had to put him on life support for 2 days"  . GERD (gastroesophageal reflux disease)   . Headache    "weekly" (09/13/2014)  . History of blood transfusion 05/2014   "we haven't figured out why I needed it yet" (09/13/2014)  . History of hiatal hernia   . Hyperglycemia   . Hyperlipidemia   . Hypertension   . Myocardial infarct   . Myocardial infarct, old 1999   15 years ago  . Obesity   . PONV (postoperative nausea and vomiting)   . S/P CABG x 4 09/18/2014   LIMA to LAD, SVG to PDA-dRCA sequentially, SVG to OM, EVH via right thigh   . Sleep apnea   . Sleep apnea    "don't use a mask" (09/13/2014)  . Unstable angina ([facility])     Medications:  Prescriptions Prior to Admission  Medication Sig Dispense Refill Last Dose  . acetaminophen (TYLENOL) 500 MG tablet Take 500 mg by mouth every 6 (six) hours as needed for headache.   06/15/2016 at Unknown time  . albuterol (PROVENTIL HFA;VENTOLIN HFA) 108 (90 Base) MCG/ACT inhaler Inhale 2 puffs into the lungs every 6 (six) hours as needed for wheezing or shortness of breath. 1 Inhaler 2 Past Week at Unknown time  . amLODipine (NORVASC) 10 MG tablet TAKE 1 TABLET BY MOUTH ONCE DAILY. 90 tablet 2 06/15/2016 at Unknown time  . aspirin 325 MG tablet Take 325 mg by mouth daily.   06/15/2016 at Unknown time  . benzonatate (TESSALON) 200 MG capsule Take 1 capsule (200 mg total) by mouth 2 (two) times daily as needed for cough. 20 capsule 0 Past Week at Unknown time  . isosorbide mononitrate (IMDUR) 60 MG 24 hr tablet Take 60 mg by mouth daily.   06/15/2016 at Unknown time  . metoprolol succinate (TOPROL-XL) 50 MG 24 hr tablet Take 1 tablet (50 mg total) by mouth daily.  Take with or immediately following a meal. 30 tablet 0 06/15/2016 at 1000  . pantoprazole (PROTONIX) 40 MG tablet Take 1 tablet (40 mg total) by mouth 2 (two) times daily. 60 tablet 11 06/15/2016 at Unknown time  . rosuvastatin (CRESTOR) 10 MG tablet Take 1 tablet (10 mg total) by mouth daily. 30 tablet 2 06/15/2016 at Unknown time  . sertraline (ZOLOFT) 50 MG tablet Take 1 tablet (50 mg total) by mouth daily. 30 tablet 3 06/15/2016 at Unknown time  . Throat Lozenges (COUGH DROPS MT) Use as directed 1 lozenge in the mouth or throat daily as needed (COUGH).   06/14/2016 at Unknown time  . ferrous sulfate 325 (65 FE) MG tablet Take 1 tablet (325 mg total) by mouth daily with breakfast. 90 tablet 1 NOT STARTED  . HYDROcodone-homatropine (HYCODAN) 5-1.5 MG/5ML syrup Take 5 mLs by mouth every 8 (eight) hours as needed for cough. 120 mL 0 NOT STARTED  . predniSONE (DELTASONE) 10  MG tablet Please take 40 mg (4 tablets) by mouth daily for 4 days, then decrease by 1 tablet daily until gone (Patient not taking: Reported on 06/15/2016) 22 tablet 0 Completed Course at 1/21   Scheduled:  . aspirin  325 mg Oral Daily  . ferrous sulfate  325 mg Oral Q breakfast  . metoprolol succinate  50 mg Oral Daily  . pantoprazole  40 mg Oral BID  . rosuvastatin  10 mg Oral Daily  . sertraline  50 mg Oral Daily  . sodium chloride flush  3 mL Intravenous Q12H   Infusions:  . sodium chloride 100 mL/hr (06/16/16 0319)    Assessment: 71yo female with history of CAD, CABG in 2016, HTN, HLD and anemia presents with syncope. Pharmacy is consulted to dose heparin for ACS/chest pain.   Goal of Therapy:  Heparin level 0.3-0.7 units/ml Monitor platelets by anticoagulation protocol: Yes   Plan:  Give 4000 units bolus x 1 Start heparin infusion at 950 units/hr Check anti-Xa level in 6 hours and daily while on heparin Continue to monitor H&H and platelets  Andrey Cota. Diona Foley, PharmD, BCPS Clinical Pharmacist [phone] 06/16/2016,9:01 AM

## 2016-06-16 NOTE — Progress Notes (Signed)
Pt. Arrived to the room from ED in alert and stable condition. Pt. Oriented to room and place on telemetry. CCMD notified. Pt. Denies pain or discomfort at this time. Call light placed within reach. RN will continue to monitor pt. For changes in condition. Ilya Ess, Katherine Roan

## 2016-06-16 NOTE — Progress Notes (Signed)
Received patient back from cath lab, VSS, no complaints of pain.  TR band in place inflated with 13 ccs air L radial.

## 2016-06-16 NOTE — H&P (View-Only) (Signed)
Patient ID: Debbie Delacruz MRN: [id number], DOB/AGE: [birth year]   Admit date: 06/15/2016   Reason for Consult: NSTEMI Requesting MD: Dr. Tana Coast, Internal Medicine    Primary Physician: Tommi Rumps, MD Primary Cardiologist: Dr. Clayborn Bigness, [place]   Pt. Profile:  71 year old female with history of chronic anemia, coronary artery disease status post stent 2, CABG x4v in 2016, hypertension and hyperlipidemia, admitted for syncope and found to have elevated troponins c/w NSTEMI.   Problem List  Past Medical History:  Diagnosis Date  . Anemia   . Anginal pain ([place])   . Anxiety   . Arthritis   . CAD (coronary artery disease)   . Carpal tunnel syndrome    bilateral  . Cataract    bil removed cataracts  . Coronary artery disease    2x stents, Dr. Clayborn Bigness  . Depression   . Dysrhythmia   . Family history of adverse reaction to anesthesia    "brother; S/P lipotripsy in [place]; transferred to [facility]; had to put him on life support for 2 days"  . GERD (gastroesophageal reflux disease)   . Headache    "weekly" (09/13/2014)  . History of blood transfusion 05/2014   "we haven't figured out why I needed it yet" (09/13/2014)  . History of hiatal hernia   . Hyperglycemia   . Hyperlipidemia   . Hypertension   . Myocardial infarct   . Myocardial infarct, old 1999   15 years ago  . Obesity   . PONV (postoperative nausea and vomiting)   . S/P CABG x 4 09/18/2014   LIMA to LAD, SVG to PDA-dRCA sequentially, SVG to OM, EVH via right thigh   . Sleep apnea   . Sleep apnea    "don't use a mask" (09/13/2014)  . Unstable angina [facility])     Past Surgical History:  Procedure Laterality Date  . ABDOMINAL HYSTERECTOMY    . APPENDECTOMY    . BACK SURGERY    . BLADDER SURGERY    . CARDIAC CATHETERIZATION  06/2014  . CARPAL TUNNEL RELEASE Left   . CATARACT EXTRACTION W/ INTRAOCULAR LENS  IMPLANT, BILATERAL    . CHOLECYSTECTOMY    . COLONOSCOPY    . CORONARY ANGIOPLASTY  WITH STENT PLACEMENT  1999   [facility] x2 stent  . CORONARY ARTERY BYPASS GRAFT N/A 09/18/2014   Procedure: CORONARY ARTERY BYPASS GRAFTING (CABG)times four using LIMA  to LAD:SVG to  PD and DIST RCA;SVG to OM;, EVH Right thigh;  Surgeon: [name], MD;  Location: [place];  Service: Open Heart Surgery;  Laterality: N/A;  . ESOPHAGOGASTRODUODENOSCOPY N/A 09/17/2014   Procedure: ESOPHAGOGASTRODUODENOSCOPY (EGD);  Surgeon: Inda Castle, MD;  Location: [place];  Service: Endoscopy;  Laterality: N/A;  . heart stents     1999  . INCONTINENCE SURGERY    . INGUINAL HERNIA REPAIR    . INGUINAL HERNIA REPAIR Right X 3   "last one was in the 1990's; still have hernia there now" (09/13/2014)  . LAPAROSCOPIC CHOLECYSTECTOMY    . LUMBAR DISC SURGERY  1990's?  . LUMBAR SPINE SURGERY    . removal of ovaries    . TEE WITHOUT CARDIOVERSION N/A 09/18/2014   Procedure: TRANSESOPHAGEAL ECHOCARDIOGRAM (TEE);  Surgeon: [name], MD;  Location: [place];  Service: Open Heart Surgery;  Laterality: N/A;  . UPPER GASTROINTESTINAL ENDOSCOPY    . VAGINAL HYSTERECTOMY       Allergies  Allergies  Allergen Reactions  . Ferrous  Sulfate [Ferrous Fumarate] Other (See Comments)    STOMACH ISSUES  . Lipitor [Atorvastatin] Other (See Comments)    myalgia    HPI  71 year old female with history of chronic anemia, coronary artery disease status post stent 2, CABG x4v in 2016, hypertension and hyperlipidemia, admitted for syncope and found to have elevated troponins c/w NSTEMI. She is followed in [place] by Dr. Clayborn Bigness. Her CABG was performed by Dr. Roxy Manns 09/18/14 (LIMA-LAD, SVG-PDA, SVG-dRCA, SVG-OM of LCx). She has not had a repeat cath since then. She was recently diagnosed with bronchitis and treated with antibiotics and two rounds of prednisone. She and her family have also been under a great deal of emotional stress lately. Her daughter-in-law recently committed suicide a week ago. She is also the primary  caregiver of her 16 y/o grandson. She has been under a lot of grief.   Yesterday, she was feeling fine, until she had sudden syncope and collapse. She denies any prodrome. No associated CP, dyspnea or palpitations. She got up from a seated position and started walking in her home when she suddenly passed out. Family was present. The next thing she can recall,  EMS was surrounding her. She states she felt a bit diaphoretic but no chest pain. No head trauma and no neurological deficits.   She has been admitted by IM. Troponin's are abnormal with upward trend, c/w NSTEMI. 1.38>>4.50. She continues to deny chest pain and dyspnea. SCr is WNL at 0.99. Hgb 8.6. EKG shows NSR with nonspecific Twave abnormalities. BP is stable. She denies any recurrent syncope/ near syncope.    Home Medications  Prior to Admission medications   Medication Sig Start Date End Date Taking? Authorizing Provider  acetaminophen (TYLENOL) 500 MG tablet Take 500 mg by mouth every 6 (six) hours as needed for headache.   Yes Historical Provider, MD  albuterol (PROVENTIL HFA;VENTOLIN HFA) 108 (90 Base) MCG/ACT inhaler Inhale 2 puffs into the lungs every 6 (six) hours as needed for wheezing or shortness of breath. 05/30/16  Yes Merlyn Lot, MD  amLODipine (NORVASC) 10 MG tablet TAKE 1 TABLET BY MOUTH ONCE DAILY. 12/04/15  Yes [name], MD  aspirin 325 MG tablet Take 325 mg by mouth daily.   Yes Historical Provider, MD  benzonatate (TESSALON) 200 MG capsule Take 1 capsule (200 mg total) by mouth 2 (two) times daily as needed for cough. 05/27/16  Yes [name], MD  isosorbide mononitrate (IMDUR) 60 MG 24 hr tablet Take 60 mg by mouth daily.   Yes Historical Provider, MD  metoprolol succinate (TOPROL-XL) 50 MG 24 hr tablet Take 1 tablet (50 mg total) by mouth daily. Take with or immediately following a meal. 05/15/16  Yes [name], MD  pantoprazole (PROTONIX) 40 MG tablet Take 1 tablet (40 mg total) by mouth 2  (two) times daily. 09/26/15  Yes Ladene Artist, MD  rosuvastatin (CRESTOR) 10 MG tablet Take 1 tablet (10 mg total) by mouth daily. 10/16/15  Yes [name], MD  sertraline (ZOLOFT) 50 MG tablet Take 1 tablet (50 mg total) by mouth daily. 01/07/16  Yes [name], MD  Throat Lozenges (COUGH DROPS MT) Use as directed 1 lozenge in the mouth or throat daily as needed (COUGH).   Yes Historical Provider, MD  ferrous sulfate 325 (65 FE) MG tablet Take 1 tablet (325 mg total) by mouth daily with breakfast. 06/10/16   [name], MD  HYDROcodone-homatropine [facility]) 5-1.5 MG/5ML syrup Take 5  mLs by mouth every 8 (eight) hours as needed for cough. 06/08/16   [name], MD  predniSONE (DELTASONE) 10 MG tablet Please take 40 mg (4 tablets) by mouth daily for 4 days, then decrease by 1 tablet daily until gone Patient not taking: Reported on 06/15/2016 06/08/16   [name], [place] . aspirin  325 mg Oral Daily  . ferrous sulfate  325 mg Oral Q breakfast  . heparin  4,000 Units Intravenous Once  . metoprolol succinate  50 mg Oral Daily  . pantoprazole  40 mg Oral BID  . rosuvastatin  10 mg Oral Daily  . sertraline  50 mg Oral Daily  . sodium chloride flush  3 mL Intravenous Q12H   Family History  Family History  Problem Relation Age of Onset  . Coronary artery disease Mother   . Osteoporosis Mother   . Heart disease Mother   . Stroke Mother   . Heart attack Mother   . Hypertension Mother   . Hypertension Father   . Coronary artery disease Father   . COPD Father   . Heart disease Father   . Heart attack Father   . Glaucoma Father   . Osteoporosis Father   . Emphysema Father   . Cancer Sister 33    colon  . Hypertension Sister   . Colon cancer Sister     dx in her 69's  . Breast cancer Sister   . Stroke Brother   . Hypertension Brother   . Stroke Maternal Grandfather   . Stroke Maternal Grandmother   . Osteoporosis Maternal Grandmother   .  Hypertension Maternal Grandmother   . Hypertension Paternal Grandmother   . Hypertension Paternal Grandfather   . Breast cancer Maternal Aunt     2 aunts-50's  . Esophageal cancer Neg Hx   . Stomach cancer Neg Hx   . Rectal cancer Neg Hx   . Pancreatic cancer Neg Hx     Social History  Social History   Social History  . Marital status: Married    Spouse name: N/A  . Number of children: N/A  . Years of education: N/A   Occupational History  . Not on file.   Social History Main Topics  . Smoking status: Never Smoker  . Smokeless tobacco: Never Used  . Alcohol use 0.0 oz/week     Comment: 09/13/2014 "might have a drink a couple times/yr"  . Drug use: No  . Sexual activity: Not Currently   Other Topics Concern  . Not on file   Social History Narrative   ** Merged History Encounter **       Lives in [place] with 79 YO nephew and husband. 3 dogs outside.  Work - Doctor, general practice, [employer]  Diet - regular, limited meat  Exercise - walks some     Review of Systems General:  No chills, fever, night sweats or weight changes.  Cardiovascular:  No chest pain, dyspnea on exertion, edema, orthopnea, palpitations, paroxysmal nocturnal dyspnea. Dermatological: No rash, lesions/masses Respiratory: No cough, dyspnea Urologic: No hematuria, dysuria Abdominal:   No nausea, vomiting, diarrhea, bright red blood per rectum, melena, or hematemesis Neurologic:  No visual changes, wkns, changes in mental status. All other systems reviewed and are otherwise negative except as noted above.  Physical Exam  Blood pressure (!) 106/58, pulse 89, temperature 98.2 F (36.8 C), temperature source Oral, resp. rate 18, height 5' 2.5" (1.588 m), weight 187 lb  14.4 oz (85.2 kg), SpO2 98 %.  General: Pleasant, NAD Psych: Normal affect. Neuro: Alert and oriented X 3. Moves all extremities spontaneously. HEENT: Normal  Neck: Supple without bruits or JVD. Lungs:  Resp regular and  unlabored, CTA. Heart: RRR no s3, s4, or murmurs. Abdomen: Soft, non-tender, non-distended, BS + x 4.  Extremities: No clubbing, cyanosis or edema. DP/PT/Radials 2+ and equal bilaterally.  Labs  Troponin [facility] of Care Test)  Recent Labs  06/15/16 1718  TROPIPOC 0.01    Recent Labs  06/16/16 0111 06/16/16 0715  TROPONINI 1.38* 4.50*  4.37*   Lab Results  Component Value Date   WBC 9.6 06/16/2016   HGB 8.6 (L) 06/16/2016   HCT 28.5 (L) 06/16/2016   MCV 76.4 (L) 06/16/2016   PLT 265 06/16/2016    Recent Labs Lab 06/16/16 0112  NA 137  K 4.0  CL 106  CO2 23  BUN 24*  CREATININE 0.99  CALCIUM 8.1*  GLUCOSE 122*   Lab Results  Component Value Date   CHOL 307 (H) 10/09/2015   HDL 37.30 (L) 10/09/2015   LDLCALC 121 (H) 09/14/2014   TRIG 231.0 (H) 10/09/2015   No results found for: DDIMER   Radiology/Studies  Dg Chest 2 View  Result Date: 05/30/2016 CLINICAL DATA:  71 y/o  F; cough. EXAM: CHEST  2 VIEW COMPARISON:  05/28/2015 chest radiograph FINDINGS: The heart size and mediastinal contours are within normal limits and stable. Both lungs are clear. Degenerative changes of thoracic spine. Median sternotomy wires are aligned. Aortic atherosclerosis with calcification. Right upper quadrant cholecystectomy clips. IMPRESSION: No active cardiopulmonary disease.  Aortic atherosclerosis. Electronically Signed   By: Kristine Garbe M.D.   On: 05/30/2016 05:09   Dg Chest 2 View  Result Date: 05/27/2016 CLINICAL DATA:  Cough and chills EXAM: CHEST  2 VIEW COMPARISON:  Chest radiograph March 07, 2015 FINDINGS: There is no edema or consolidation. Heart size and pulmonary vascularity are normal. No adenopathy. There is atherosclerotic calcification in the aorta. Patient is status post coronary artery bypass grafting. There is degenerative change in the thoracic spine. IMPRESSION: No edema or consolidation.  Aortic atherosclerosis. Electronically Signed   By: Lowella Grip III M.D.   On: 05/27/2016 16:54   Ct Head Wo Contrast  Result Date: 06/16/2016 CLINICAL DATA:  Initial evaluation for acute syncope. EXAM: CT HEAD WITHOUT CONTRAST TECHNIQUE: Contiguous axial images were obtained from the base of the skull through the vertex without intravenous contrast. COMPARISON:  Prior CT from 11/03/2013. FINDINGS: Brain: Age appropriate cerebral atrophy with mild chronic microvascular ischemic disease. No acute intracranial hemorrhage. No evidence for acute large vessel territory infarct. No mass lesion, midline shift, or mass effect. No hydrocephalus. No extra-axial fluid collection. Vascular: No hyperdense vessel. Scattered vascular calcifications noted within the carotid siphons. Skull: Scalp soft tissues within normal limits.  Calvarium intact. Sinuses/Orbits: Globes and orbits within normal limits. Patient is status post lens extraction bilaterally. Mild mucosal thickening within the maxillary sinuses and ethmoidal air cells. Paranasal sinuses are otherwise clear. Small right mastoid effusion noted. IMPRESSION: No acute intracranial process. Electronically Signed   By: Jeannine Boga M.D.   On: 06/16/2016 05:24    ECG  NSR with nonspecific Twave abnormalties    ASSESSMENT AND PLAN  71 year old female with history of chronic anemia, coronary artery disease status post stent 2, CABG x4v in 2016, hypertension and hyperlipidemia, admitted for syncope and found to have elevated troponins c/w NSTEMI. Troponin trend: 1.38>>4.50.  She denies CP. EKG w/o ischemic changes. However, given her history and degree of troponin elevation, we will start IV heparin and plan of LHC today to reassess status of native vessels and grafts. Given recent sudden death of her daughter-in-law, this may also be stressed induced MI/cardiomyopathy. We will continue to follow with further recommendations post cath.    Signed, Lyda Jester, PA-C 06/16/2016, 9:23 AM   Personally  seen and examined. Agree with above.  NSTEMI  - no CP, just syncope. ? Stress induced (recent suicide in family).  - Cath. Risks and benefits including CVA, MI, death, bleeding explained.   Syncope   - recent abx, bronchitis, pale, diaphoretic, ?vagal. Doubt PE (no CP, no LE pain, no edema).   CAD post CABG 2016  - been doing well up to now.   AAO x 3, RRR, skin dry. Lungs clear.   Candee Furbish, MD

## 2016-06-16 NOTE — Progress Notes (Signed)
Follow-up:  Notified by RN regarding elevated troponin of 1.38. Initial troponin negative. Pt continues to deny CP and has rested in NAD since arriving to the floor. VSS. Discussed pt w/ Dr Jeannine Boga w/ cardiology service. He does not recommend IV Heparin at this time in the absence of active CP or other symptoms. He also recommends obtaining a CT head w/o CM as initial c/o was syncope.  Assessment/Plan: 1. Elevated troponin: Worrisome for NSTEMI in this pt w/ significant h/o CAD s/p 4-vessel CABG (09/18/2014) though admittedly pt has been w/o symptoms and EKG has been w/o acute changes. Will repeat 12-lead EKG. Will obtain head ct w/o cm. Will continue to cycle troponin's and re-consult cardiology in am as indicated. Will continue to monitor closely on telemetry.   Jeryl Columbia, NP-C [facility] Pager [phone]

## 2016-06-16 NOTE — Progress Notes (Signed)
[facility]                                                                              Patient Demographics  Debbie Delacruz, is a 71 y.o. female, DOB - Jan 27, 1946, [id number]  Admit date - 06/15/2016   Admitting Physician Ripudeep Krystal Eaton, MD  Outpatient Primary MD for the patient is Tommi Rumps, MD  Outpatient specialists:   LOS - 0  days    Chief Complaint  Patient presents with  . Loss of Consciousness       Brief summary   Patient is a 71 year old female with history of chronic anemia, coronary artery disease status post stent 2, CABG x4v, hypertension, hyperlipidemia, dysrhythmias, follows cardiology at [facility] presented with syncopal episode. Patient also reported extreme stressful period at home recently with daughter-in-law who committed suicide last week. Patient was admitted for syncope workup, overnight however troponin trended up to 1.38.   Assessment & Plan    Principal Problem:    Syncope; possible vasovagal episode, with underlying significant cardiac history, NSTEMI. Patient is also going through extreme stressful period at home   -   orthostatics negative, troponins positive, and trending up - Cardiology consulted - 2-D echo showed EF of 55-60% with hypokinesis of the basal inferior myocardium, grade 1 diastolic dysfunction  Active Problems: NSTEMI  - No chest pain, troponins trending up, 0.01 in ED-> 1.38-> 4.37-> 4.5 - Continue aspirin, beta blocker, cardiology consulted, recommended IV heparin drip - Pending cardiac cath today     Essential hypertension, benign -Orthostatic vitals negative, for now continue only beta blocker - BP soft    Anemia - Patient has a history of chronic anemia, per patient with extensive workup in the past  - Obtain stool occult test - Anemia panel showed ferritin 6, iron 25 - Patient had EGD in May 2017 ( Dr. Fuller Plan) which showed esophageal mucosal changes country to establish long segment  Barrett's disease small hiatal hernia, repeat upper endoscopy in 2 years - Colonoscopy in 12/2012 ( Dr. Fuller Plan) had shown moderate diverticulosis and descending colon and sigmoid colon, repeat colonoscopy in 5 years -  will benefit from iron replacement    CAD (coronary artery disease), status post CABG 4v - Continue aspirin, statin, beta blocker    GERD (gastroesophageal reflux disease) - Continue PPI    AKI (acute kidney injury) ([place]) - Placed on gentle hydration    Code Status: Full CODE STATUS DVT Prophylaxis:  SCD's Family Communication: Discussed in detail with the patient, all imaging results, lab results explained to the patient   Disposition Plan:   Time Spent in minutes   25 minutes  Procedures:    Consultants:   Cardiology   Antimicrobials:      Medications  Scheduled Meds: . aspirin  325 mg Oral Daily  . ferrous sulfate  325 mg Oral Q breakfast  . metoprolol succinate  50 mg Oral Daily  . pantoprazole  40 mg Oral BID  . rosuvastatin  10 mg Oral Daily  . sertraline  50 mg Oral Daily  . sodium chloride flush  3 mL Intravenous Q12H   Continuous  Infusions: . sodium chloride 100 mL/hr (06/16/16 0319)  . heparin 950 Units/hr (06/16/16 0947)   PRN Meds:.acetaminophen **OR** acetaminophen, albuterol, benzonatate   Antibiotics   Anti-infectives    None        Subjective:   [name] was seen and examined today.   Patient denies dizziness, chest pain, shortness of breath, abdominal pain, N/V/D/C, new weakness, numbess, tingling. No acute events overnight.    Objective:   Vitals:   06/15/16 2330 06/16/16 0009 06/16/16 0638 06/16/16 0800  BP: 123/69 105/63 114/69 (!) 106/58  Pulse: 76 75 89 89  Resp: 21 20 18 18   Temp:  98.2 F (36.8 C) 98.3 F (36.8 C) 98.2 F (36.8 C)  TempSrc:  Oral Oral Oral  SpO2: 95% 97% 99% 98%  Weight:  85.2 kg (187 lb 14.4 oz)    Height:  5' 2.5" (1.588 m)      Intake/Output Summary (Last 24 hours) at  06/16/16 1150 Last data filed at 06/16/16 1024  Gross per 24 hour  Intake          1748.33 ml  Output             1000 ml  Net           748.33 ml     Wt Readings from Last 3 Encounters:  06/16/16 85.2 kg (187 lb 14.4 oz)  06/08/16 86.1 kg (189 lb 14.4 oz)  05/30/16 84.8 kg (187 lb)     Exam  General: Alert and oriented x 3, NAD  HEENT:    Neck: Supple, no JVD, no masses  Cardiovascular: S1 S2 auscultated, no rubs, murmurs or gallops. Regular rate and rhythm.  Respiratory: Clear to auscultation bilaterally, no wheezing, rales or rhonchi  Gastrointestinal: Soft, nontender, nondistended, + bowel sounds  Ext: no cyanosis clubbing or edema  Neuro: AAOx3, Cr N's II- XII. Strength 5/5 upper and lower extremities bilaterally  Skin: No rashes  Psych: Normal affect and demeanor, alert and oriented x3    Data Reviewed:  I have personally reviewed following labs and imaging studies  Micro Results No results found for this or any previous visit (from the past 240 hour(s)).  Radiology Reports Dg Chest 2 View  Result Date: 05/30/2016 CLINICAL DATA:  71 y/o  F; cough. EXAM: CHEST  2 VIEW COMPARISON:  05/28/2015 chest radiograph FINDINGS: The heart size and mediastinal contours are within normal limits and stable. Both lungs are clear. Degenerative changes of thoracic spine. Median sternotomy wires are aligned. Aortic atherosclerosis with calcification. Right upper quadrant cholecystectomy clips. IMPRESSION: No active cardiopulmonary disease.  Aortic atherosclerosis. Electronically Signed   By: Kristine Garbe M.D.   On: 05/30/2016 05:09   Dg Chest 2 View  Result Date: 05/27/2016 CLINICAL DATA:  Cough and chills EXAM: CHEST  2 VIEW COMPARISON:  Chest radiograph March 07, 2015 FINDINGS: There is no edema or consolidation. Heart size and pulmonary vascularity are normal. No adenopathy. There is atherosclerotic calcification in the aorta. Patient is status post coronary  artery bypass grafting. There is degenerative change in the thoracic spine. IMPRESSION: No edema or consolidation.  Aortic atherosclerosis. Electronically Signed   By: Lowella Grip III M.D.   On: 05/27/2016 16:54   Ct Head Wo Contrast  Result Date: 06/16/2016 CLINICAL DATA:  Initial evaluation for acute syncope. EXAM: CT HEAD WITHOUT CONTRAST TECHNIQUE: Contiguous axial images were obtained from the base of the skull through the vertex without intravenous contrast. COMPARISON:  Prior CT from  11/03/2013. FINDINGS: Brain: Age appropriate cerebral atrophy with mild chronic microvascular ischemic disease. No acute intracranial hemorrhage. No evidence for acute large vessel territory infarct. No mass lesion, midline shift, or mass effect. No hydrocephalus. No extra-axial fluid collection. Vascular: No hyperdense vessel. Scattered vascular calcifications noted within the carotid siphons. Skull: Scalp soft tissues within normal limits.  Calvarium intact. Sinuses/Orbits: Globes and orbits within normal limits. Patient is status post lens extraction bilaterally. Mild mucosal thickening within the maxillary sinuses and ethmoidal air cells. Paranasal sinuses are otherwise clear. Small right mastoid effusion noted. IMPRESSION: No acute intracranial process. Electronically Signed   By: Jeannine Boga M.D.   On: 06/16/2016 05:24    Lab Data:  CBC:  Recent Labs Lab 06/15/16 1142 06/16/16 0112  WBC 8.5 9.6  HGB 9.5* 8.6*  HCT 32.0* 28.5*  MCV 76.7* 76.4*  PLT 314 [id number]   Basic Metabolic Panel:  Recent Labs Lab 06/15/16 1142 06/16/16 0112  NA 139 137  K 3.9 4.0  CL 104 106  CO2 24 23  GLUCOSE 123* 122*  BUN 26* 24*  CREATININE 1.17* 0.99  CALCIUM 8.8* 8.1*   GFR: Estimated Creatinine Clearance: 55.7 mL/min (by C-G formula based on SCr of 0.99 mg/dL). Liver Function Tests: No results for input(s): AST, ALT, ALKPHOS, BILITOT, PROT, ALBUMIN in the last 168 hours. No results for input(s):  LIPASE, AMYLASE in the last 168 hours. No results for input(s): AMMONIA in the last 168 hours. Coagulation Profile: No results for input(s): INR, PROTIME in the last 168 hours. Cardiac Enzymes:  Recent Labs Lab 06/16/16 0111 06/16/16 0715  TROPONINI 1.38* 4.50*  4.37*   BNP (last 3 results) No results for input(s): PROBNP in the last 8760 hours. HbA1C: No results for input(s): HGBA1C in the last 72 hours. CBG:  Recent Labs Lab 06/16/16 0703  GLUCAP 94   Lipid Profile: No results for input(s): CHOL, HDL, LDLCALC, TRIG, CHOLHDL, LDLDIRECT in the last 72 hours. Thyroid Function Tests: No results for input(s): TSH, T4TOTAL, FREET4, T3FREE, THYROIDAB in the last 72 hours. Anemia Panel:  Recent Labs  06/16/16 0111 06/16/16 0112  VITAMINB12 182  --   FOLATE  --  12.2  FERRITIN 6*  --   TIBC 399  --   IRON 25*  --   RETICCTPCT 3.0  --    Urine analysis:    Component Value Date/Time   COLORURINE YELLOW 06/15/2016 1931   APPEARANCEUR CLEAR 06/15/2016 1931   APPEARANCEUR Clear 06/05/2014 0803   LABSPEC 1.017 06/15/2016 1931   LABSPEC 1.012 06/05/2014 0803   PHURINE 7.0 06/15/2016 1931   GLUCOSEU NEGATIVE 06/15/2016 1931   GLUCOSEU Negative 06/05/2014 0803   HGBUR NEGATIVE 06/15/2016 1931   HGBUR negative 01/17/2007 1504   BILIRUBINUR NEGATIVE 06/15/2016 1931   BILIRUBINUR Negative 06/05/2014 0803   KETONESUR NEGATIVE 06/15/2016 1931   PROTEINUR NEGATIVE 06/15/2016 1931   UROBILINOGEN 0.2 03/07/2015 2045   NITRITE NEGATIVE 06/15/2016 1931   LEUKOCYTESUR NEGATIVE 06/15/2016 1931   LEUKOCYTESUR Negative 06/05/2014 0803     RAI,RIPUDEEP M.D. [facility] 06/16/2016, 11:50 AM  Pager: [phone] Between 7am to 7pm - call Pager - [phone]  After 7pm go to [email or web address] - password [email or web address]  Call night coverage person covering after 7pm

## 2016-06-17 ENCOUNTER — Telehealth: Payer: Self-pay | Admitting: Family Medicine

## 2016-06-17 ENCOUNTER — Encounter (HOSPITAL_COMMUNITY): Payer: Self-pay | Admitting: Cardiovascular Disease

## 2016-06-17 DIAGNOSIS — I2510 Atherosclerotic heart disease of native coronary artery without angina pectoris: Secondary | ICD-10-CM | POA: Diagnosis not present

## 2016-06-17 DIAGNOSIS — I10 Essential (primary) hypertension: Secondary | ICD-10-CM | POA: Diagnosis not present

## 2016-06-17 DIAGNOSIS — R55 Syncope and collapse: Secondary | ICD-10-CM | POA: Diagnosis not present

## 2016-06-17 DIAGNOSIS — K210 Gastro-esophageal reflux disease with esophagitis: Secondary | ICD-10-CM | POA: Diagnosis not present

## 2016-06-17 DIAGNOSIS — N179 Acute kidney failure, unspecified: Secondary | ICD-10-CM | POA: Diagnosis not present

## 2016-06-17 DIAGNOSIS — Z951 Presence of aortocoronary bypass graft: Secondary | ICD-10-CM | POA: Diagnosis not present

## 2016-06-17 DIAGNOSIS — I214 Non-ST elevation (NSTEMI) myocardial infarction: Secondary | ICD-10-CM | POA: Diagnosis not present

## 2016-06-17 LAB — CBC
HEMATOCRIT: 29.5 % — AB (ref 36.0–46.0)
Hemoglobin: 8.7 g/dL — ABNORMAL LOW (ref 12.0–15.0)
MCH: 22.9 pg — AB (ref 26.0–34.0)
MCHC: 29.5 g/dL — ABNORMAL LOW (ref 30.0–36.0)
MCV: 77.6 fL — AB (ref 78.0–100.0)
Platelets: 232 10*3/uL (ref 150–400)
RBC: 3.8 MIL/uL — ABNORMAL LOW (ref 3.87–5.11)
RDW: 18.6 % — ABNORMAL HIGH (ref 11.5–15.5)
WBC: 6.3 10*3/uL (ref 4.0–10.5)

## 2016-06-17 LAB — BASIC METABOLIC PANEL
Anion gap: 5 (ref 5–15)
BUN: 19 mg/dL (ref 6–20)
CHLORIDE: 109 mmol/L (ref 101–111)
CO2: 25 mmol/L (ref 22–32)
CREATININE: 0.99 mg/dL (ref 0.44–1.00)
Calcium: 8 mg/dL — ABNORMAL LOW (ref 8.9–10.3)
GFR calc Af Amer: >60 mL/min (ref >60)
GFR calc non Af Amer: 57 mL/min — ABNORMAL LOW (ref >60)
GLUCOSE: 105 mg/dL — AB (ref 65–99)
POTASSIUM: 4.6 mmol/L (ref 3.5–5.1)
SODIUM: 139 mmol/L (ref 135–145)

## 2016-06-17 MED ORDER — CLOPIDOGREL BISULFATE 75 MG PO TABS: 75.0000 mg | ORAL_TABLET | Freq: Every day | ORAL | 3 refills | Status: DC

## 2016-06-17 MED ORDER — CLOPIDOGREL BISULFATE 75 MG PO TABS
75.0000 mg | ORAL_TABLET | Freq: Every day | ORAL | Status: DC
  Administered 2016-06-17: 75 mg via ORAL
  Filled 2016-06-17: qty 1

## 2016-06-17 NOTE — Progress Notes (Addendum)
Progress Note  Patient Name: Debbie Delacruz Date of Encounter: 06/17/2016  Primary Cardiologist: Dr. Clayborn Bigness  Subjective   Feeling well this morning. No chest pain or dyspnea.  Inpatient Medications    Scheduled Meds: . aspirin  325 mg Oral Daily  . ferrous sulfate  325 mg Oral Q breakfast  . metoprolol succinate  50 mg Oral Daily  . pantoprazole  40 mg Oral BID  . rosuvastatin  10 mg Oral Daily  . sertraline  50 mg Oral Daily  . sodium chloride flush  3 mL Intravenous Q12H  . sodium chloride flush  3 mL Intravenous Q12H   Continuous Infusions:  PRN Meds: sodium chloride, acetaminophen **OR** acetaminophen, albuterol, benzonatate, sodium chloride flush   Vital Signs    Vitals:   06/16/16 2002 06/17/16 0013 06/17/16 0542 06/17/16 0826  BP: 99/60 104/66 (!) 112/57 109/61  Pulse: 80 78 74 81  Resp: 18 18 18 18   Temp: 98.3 F (36.8 C) 97.9 F (36.6 C) 98.9 F (37.2 C) 98.4 F (36.9 C)  TempSrc: Oral Oral Oral Oral  SpO2:  98% 98% 100%  Weight:   186 lb (84.4 kg)   Height:        Intake/Output Summary (Last 24 hours) at 06/17/16 0921 Last data filed at 06/17/16 0853  Gross per 24 hour  Intake              480 ml  Output             1100 ml  Net             -620 ml   Filed Weights   06/15/16 1145 06/16/16 0009 06/17/16 0542  Weight: 187 lb (84.8 kg) 187 lb 14.4 oz (85.2 kg) 186 lb (84.4 kg)    Telemetry    SR - Personally Reviewed  ECG    N/A - Personally Reviewed  Physical Exam   GEN: No acute distress.  Neck: No JVD Cardiac: RRR, no murmurs, rubs, or gallops.  Respiratory: Clear to auscultation bilaterally. GI: Soft, nontender, non-distended  MS: No edema; No deformity. Left wrist without bruising or hematoma. Neuro:  AAOx3. Psych: Normal affect  Labs    Chemistry Recent Labs Lab 06/15/16 1142 06/16/16 0112 06/17/16 0531  NA 139 137 139  K 3.9 4.0 4.6  CL 104 106 109  CO2 24 23 25   GLUCOSE 123* 122* 105*  BUN 26* 24* 19    CREATININE 1.17* 0.99 0.99  CALCIUM 8.8* 8.1* 8.0*  GFRNONAA 47* 57* 57*  GFRAA 54* >60 >60  ANIONGAP 11 8 5      Hematology Recent Labs Lab 06/15/16 1142 06/16/16 0111 06/16/16 0112 06/17/16 0531  WBC 8.5  --  9.6 6.3  RBC 4.17 3.72* 3.73* 3.80*  HGB 9.5*  --  8.6* 8.7*  HCT 32.0*  --  28.5* 29.5*  MCV 76.7*  --  76.4* 77.6*  MCH 22.8*  --  23.1* 22.9*  MCHC 29.7*  --  30.2 29.5*  RDW 18.1*  --  18.3* 18.6*  PLT 314  --  265 232    Cardiac Enzymes Recent Labs Lab 06/16/16 0111 06/16/16 0715 06/16/16 1239  TROPONINI 1.38* 4.50*  4.37* 5.19*    Recent Labs Lab 06/15/16 1718  TROPIPOC 0.01     BNPNo results for input(s): BNP, PROBNP in the last 168 hours.   DDimer No results for input(s): DDIMER in the last 168 hours.   Radiology    Ct  Head Wo Contrast  Result Date: 06/16/2016 CLINICAL DATA:  Initial evaluation for acute syncope. EXAM: CT HEAD WITHOUT CONTRAST TECHNIQUE: Contiguous axial images were obtained from the base of the skull through the vertex without intravenous contrast. COMPARISON:  Prior CT from 11/03/2013. FINDINGS: Brain: Age appropriate cerebral atrophy with mild chronic microvascular ischemic disease. No acute intracranial hemorrhage. No evidence for acute large vessel territory infarct. No mass lesion, midline shift, or mass effect. No hydrocephalus. No extra-axial fluid collection. Vascular: No hyperdense vessel. Scattered vascular calcifications noted within the carotid siphons. Skull: Scalp soft tissues within normal limits.  Calvarium intact. Sinuses/Orbits: Globes and orbits within normal limits. Patient is status post lens extraction bilaterally. Mild mucosal thickening within the maxillary sinuses and ethmoidal air cells. Paranasal sinuses are otherwise clear. Small right mastoid effusion noted. IMPRESSION: No acute intracranial process. Electronically Signed   By: Jeannine Boga M.D.   On: 06/16/2016 05:24    Cardiac Studies   LHC:  06/16/16  Conclusion     Post Atrio lesion, 100 %stenosed.  SVG graft was visualized by angiography and is normal in caliber.  Dist Graft lesion, 100 %stenosed.  Mid RCA to Dist RCA lesion, 60 %stenosed.  Prox RCA lesion, 60 %stenosed.  Prox Cx to Mid Cx lesion, 99 %stenosed.  SVG graft was visualized by angiography and is normal in caliber and anatomically normal.  Prox LAD lesion, 100 %stenosed.  LIMA graft was visualized by angiography and is normal in caliber.  Ost LM to LM lesion, 30 %stenosed.  Prox RCA to Mid RCA lesion, 70 %stenosed.   1. Severe triple vessel CAD s/p 4V CABG with 3/4 patent bypass grafts.  2. The LAD is chronically occluded in the mid section. The mid and distal LAD fills from the patent LIMA graft.  3. There is high grade stenosis in the mid Circumflex supplying the obtuse marginal branch. The patent vein graft supplies the OM branch.  4. The mid to distal RCA has high grade stenosis leading into the PDA branch. The sequential vein graft supplies the PDA but the limb to the Posterolateral branch is now occluded. I suspect the graft to her Posterolateral branch may be the culprit for her presentation but PCI is not an option.   Recommendations: No good targets for PCI. Continue medical management of CAD. Consider addition of Plavix.    TTE: 06/16/16  Study Conclusions  - Left ventricle: The cavity size was normal. Wall thickness was   increased in a pattern of mild LVH. Systolic function was normal.   The estimated ejection fraction was in the range of 55% to 60%.   There is hypokinesis of the basalinferior myocardium. Doppler   parameters are consistent with abnormal left ventricular   relaxation (grade 1 diastolic dysfunction). - Mitral valve: Calcified annulus. - Left atrium: The atrium was mildly dilated. - Pulmonary arteries: Systolic pressure was mildly increased. PA   peak pressure: 36 mm Hg (S).  Impressions:  - Hypokinesis of  the basal inferior wall with overall normal LV   systolic function; grade 1 diastolic dysfunction; mild LVH; mild   LAE; mild TR with mildly elevated pulmonary pressure.  Patient Profile     71 y.o. female with PMH of chronic anemia, coronary artery disease status post stent 2, CABG x4v in 2016, hypertension and hyperlipidemia, admitted for syncope and found to have elevated troponins c/w NSTEMI. Underwent LHC yesterday with Dr. Angelena Form  Assessment & Plan    1. Elevate Trop:  -  no CP, just syncope. ? Stress induced (recent suicide in family).  - Cath showed 3/4 patent grafts, with sequential vein graft suppling the PDA but the limb to the Posterolateral branch is now occluded.This could be an culpit for presentation but PCI is not an option.  - echo with normal EF, with G1DD.   2. Syncope   - recent abx, bronchitis, pale, diaphoretic, ?vagal. Doubt PE (no CP, no LE pain, no edema).   3. CAD post CABG 2016  - been doing well up to now. Cath report noted above. Consider the addition of plavix per Dr. Camillia Herter cath report.   - on BB, ASA, statin. Last lipid panel in 5/17 showed elevated trig and cholesterol. Needs to have this followed in the outpatient setting.   Signed, Reino Bellis, NP  06/17/2016, 9:21 AM    Personally seen and examined. Agree with above.  Will add Plavix to complete medical mgt.  Cath with jump graft distal PL branch occluded. No PCI. EF normal.  ?Syncope vagal mediated in the setting of volume depletion from recent URI. No adverse rhythms on tele.   OK to DC from cardiac perspective.  She will call Dr. Etta Quill office to make appt.  I will route note to him.   Candee Furbish, MD

## 2016-06-17 NOTE — Discharge Summary (Signed)
Physician Discharge Summary   Patient ID: Debbie Delacruz MRN: [id number] DOB/AGE: [birth year] 71 y.o.  Admit date: 06/15/2016 Discharge date: 06/17/2016  Primary Care Physician:  Tommi Rumps, MD  Discharge Diagnoses:    . Syncope . AKI (acute kidney injury) ([place]) . NSTEMI (non-ST elevated myocardial infarction) ([place]) . GERD (gastroesophageal reflux disease) . Essential hypertension, benign . CAD (coronary artery disease) . chronic Anemia    Consults:  Cardiology  Recommendations for Outpatient Follow-up:  1. Amlodipine currently placed on hold due to soft BP 2. Please repeat CBC/BMET at next visit 3. Post cardiac cath, cardiology recommended to add Plavix 75 mg daily   DIET: Heart healthy diet    Allergies:   Allergies  Allergen Reactions  . Ferrous Sulfate [Ferrous Fumarate] Other (See Comments)    STOMACH ISSUES  . Lipitor [Atorvastatin] Other (See Comments)    myalgia     DISCHARGE MEDICATIONS: Current Discharge Medication List    START taking these medications   Details  clopidogrel (PLAVIX) 75 MG tablet Take 1 tablet (75 mg total) by mouth daily. Qty: 75 tablet, Refills: 3      CONTINUE these medications which have NOT CHANGED   Details  acetaminophen (TYLENOL) 500 MG tablet Take 500 mg by mouth every 6 (six) hours as needed for headache.    albuterol (PROVENTIL HFA;VENTOLIN HFA) 108 (90 Base) MCG/ACT inhaler Inhale 2 puffs into the lungs every 6 (six) hours as needed for wheezing or shortness of breath. Qty: 1 Inhaler, Refills: 2    aspirin 325 MG tablet Take 325 mg by mouth daily.    benzonatate (TESSALON) 200 MG capsule Take 1 capsule (200 mg total) by mouth 2 (two) times daily as needed for cough. Qty: 20 capsule, Refills: 0    isosorbide mononitrate (IMDUR) 60 MG 24 hr tablet Take 60 mg by mouth daily.    metoprolol succinate (TOPROL-XL) 50 MG 24 hr tablet Take 1 tablet (50 mg total) by mouth daily. Take with or immediately  following a meal. Qty: 30 tablet, Refills: 0    pantoprazole (PROTONIX) 40 MG tablet Take 1 tablet (40 mg total) by mouth 2 (two) times daily. Qty: 60 tablet, Refills: 11   Associated Diagnoses: Barrett's esophagus without dysplasia    rosuvastatin (CRESTOR) 10 MG tablet Take 1 tablet (10 mg total) by mouth daily. Qty: 30 tablet, Refills: 2    sertraline (ZOLOFT) 50 MG tablet Take 1 tablet (50 mg total) by mouth daily. Qty: 30 tablet, Refills: 3   Associated Diagnoses: Depression    Throat Lozenges (COUGH DROPS MT) Use as directed 1 lozenge in the mouth or throat daily as needed (COUGH).    ferrous sulfate 325 (65 FE) MG tablet Take 1 tablet (325 mg total) by mouth daily with breakfast. Qty: 90 tablet, Refills: 1    HYDROcodone-homatropine (HYCODAN) 5-1.5 MG/5ML syrup Take 5 mLs by mouth every 8 (eight) hours as needed for cough. Qty: 120 mL, Refills: 0      STOP taking these medications     amLODipine (NORVASC) 10 MG tablet      predniSONE (DELTASONE) 10 MG tablet          Brief H and P: For complete details please refer to admission H and P, but in brief Patient is a 71 year old female with history of chronic anemia, coronary artery disease status post stent 2, CABG x4v, hypertension, hyperlipidemia, dysrhythmias, follows cardiology at [facility] presented with syncopal episode. Patient also reported extreme stressful period at home  recently with daughter-in-law who committed suicide last week. Patient was admitted for syncope workup, overnight however troponin trended up to 1.38.  Hospital Course:   Syncope; possible vasovagal episode, with underlying significant cardiac history, NSTEMI. Patient is also going through extreme stressful period at home  - Patient was admitted for syncope workup, placed on telemetry. Orthostatic vitals were negative. Serial troponins were checked which were positive, trended up to 5.19. Patient was placed on IV heparin drip. She was continued  on aspirin, beta blocker, statin. - Cardiology was consulted - 2-D echo showed EF of 55-60% with hypokinesis of the basal inferior myocardium, grade 1 diastolic dysfunction   NSTEMI  - No chest pain, troponins trending up, 0.01 in ED-> 1.38-> 4.37-> 4.5-> 5.19 - Continue aspirin, beta blocker, cardiology consulted, recommended IV heparin drip - Patient underwent cardiac catheterization which showed severe triple-vessel coronary disease status post 4V CABG with 3/4 patent bypass grafts. No good targets for PCI. Continue medical management of CAD and consider addition of Plavix. - Cardiology added Plavix at the time of discharge.   Essential hypertension, benign - Orthostatic vitals negative, however BP was soft through the hospitalization. - Patient was only continued on beta blocker. At the time of discharge, patient was recommended to hold off on amlodipine, she can continue Imdur due to severe coronary artery disease.    Chronic Anemia - Patient has a history of chronic anemia, per patient with extensive workup in the past. Occult stool test was ordered but still pending  - Anemia panel showed ferritin 6, iron 25 - Patient had EGD in May 2017 ( Dr. Fuller Plan) which showed esophageal mucosal changes country to establish long segment Barrett's disease small hiatal hernia, repeat upper endoscopy in 2 years - Colonoscopy in 12/2012 ( Dr. Fuller Plan) had shown moderate diverticulosis and descending colon and sigmoid colon, repeat colonoscopy in 5 years. Follow outpatient with GI and referral to hematology recommended.   CAD (coronary artery disease), status post CABG 4v - Continue aspirin, statin, beta blocker. Plavix added, please see above.  GERD (gastroesophageal reflux disease) - Continue PPI  AKI (acute kidney injury) ([place]) - Creatinine 1.17 at the time of admission, baseline 0.94. Placed on gentle hydration, creatinine resolved, 0.9 at the time of discharge.   Day of  Discharge BP 107/64   Pulse 75   Temp 98.4 F (36.9 C) (Oral)   Resp 18   Ht 5' 2.5" (1.588 m)   Wt 84.4 kg (186 lb) Comment: scale c  SpO2 100%   BMI 33.48 kg/m   Physical Exam: General: Alert and awake oriented x3 not in any acute distress. HEENT: anicteric sclera, pupils reactive to light and accommodation CVS: S1-S2 clear no murmur rubs or gallops Chest: clear to auscultation bilaterally, no wheezing rales or rhonchi Abdomen: soft nontender, nondistended, normal bowel sounds Extremities: no cyanosis, clubbing or edema noted bilaterally Neuro: Cranial nerves II-XII intact, no focal neurological deficits   The results of significant diagnostics from this hospitalization (including imaging, microbiology, ancillary and laboratory) are listed below for reference.    LAB RESULTS: Basic Metabolic Panel:  Recent Labs Lab 06/16/16 0112 06/17/16 0531  NA 137 139  K 4.0 4.6  CL 106 109  CO2 23 25  GLUCOSE 122* 105*  BUN 24* 19  CREATININE 0.99 0.99  CALCIUM 8.1* 8.0*   Liver Function Tests: No results for input(s): AST, ALT, ALKPHOS, BILITOT, PROT, ALBUMIN in the last 168 hours. No results for input(s): LIPASE, AMYLASE in the last  168 hours. No results for input(s): AMMONIA in the last 168 hours. CBC:  Recent Labs Lab 06/16/16 0112 06/17/16 0531  WBC 9.6 6.3  HGB 8.6* 8.7*  HCT 28.5* 29.5*  MCV 76.4* 77.6*  PLT 265 232   Cardiac Enzymes:  Recent Labs Lab 06/16/16 0715 06/16/16 1239  TROPONINI 4.50*  4.37* 5.19*   BNP: Invalid input(s): POCBNP CBG:  Recent Labs Lab 06/16/16 0703  GLUCAP 94    Significant Diagnostic Studies:  Ct Head Wo Contrast  Result Date: 06/16/2016 CLINICAL DATA:  Initial evaluation for acute syncope. EXAM: CT HEAD WITHOUT CONTRAST TECHNIQUE: Contiguous axial images were obtained from the base of the skull through the vertex without intravenous contrast. COMPARISON:  Prior CT from 11/03/2013. FINDINGS: Brain: Age appropriate  cerebral atrophy with mild chronic microvascular ischemic disease. No acute intracranial hemorrhage. No evidence for acute large vessel territory infarct. No mass lesion, midline shift, or mass effect. No hydrocephalus. No extra-axial fluid collection. Vascular: No hyperdense vessel. Scattered vascular calcifications noted within the carotid siphons. Skull: Scalp soft tissues within normal limits.  Calvarium intact. Sinuses/Orbits: Globes and orbits within normal limits. Patient is status post lens extraction bilaterally. Mild mucosal thickening within the maxillary sinuses and ethmoidal air cells. Paranasal sinuses are otherwise clear. Small right mastoid effusion noted. IMPRESSION: No acute intracranial process. Electronically Signed   By: Jeannine Boga M.D.   On: 06/16/2016 05:24    2D ECHO: Study Conclusions  - Left ventricle: The cavity size was normal. Wall thickness was   increased in a pattern of mild LVH. Systolic function was normal.   The estimated ejection fraction was in the range of 55% to 60%.   There is hypokinesis of the basalinferior myocardium. Doppler   parameters are consistent with abnormal left ventricular   relaxation (grade 1 diastolic dysfunction). - Mitral valve: Calcified annulus. - Left atrium: The atrium was mildly dilated. - Pulmonary arteries: Systolic pressure was mildly increased. PA   peak pressure: 36 mm Hg (S).  Impressions:  - Hypokinesis of the basal inferior wall with overall normal LV   systolic function; grade 1 diastolic dysfunction; mild LVH; mild   LAE; mild TR with mildly elevated pulmonary pressure.   Procedures   Left Heart Cath and Cors/Grafts Angiography  Conclusion     Post Atrio lesion, 100 %stenosed.  SVG graft was visualized by angiography and is normal in caliber.  Dist Graft lesion, 100 %stenosed.  Mid RCA to Dist RCA lesion, 60 %stenosed.  Prox RCA lesion, 60 %stenosed.  Prox Cx to Mid Cx lesion, 99  %stenosed.  SVG graft was visualized by angiography and is normal in caliber and anatomically normal.  Prox LAD lesion, 100 %stenosed.  LIMA graft was visualized by angiography and is normal in caliber.  Ost LM to LM lesion, 30 %stenosed.  Prox RCA to Mid RCA lesion, 70 %stenosed.   1. Severe triple vessel CAD s/p 4V CABG with 3/4 patent bypass grafts.  2. The LAD is chronically occluded in the mid section. The mid and distal LAD fills from the patent LIMA graft.  3. There is high grade stenosis in the mid Circumflex supplying the obtuse marginal branch. The patent vein graft supplies the OM branch.  4. The mid to distal RCA has high grade stenosis leading into the PDA branch. The sequential vein graft supplies the PDA but the limb to the Posterolateral branch is now occluded. I suspect the graft to her Posterolateral branch may be the culprit  for her presentation but PCI is not an option.   Recommendations: No good targets for PCI. Continue medical management of CAD. Consider addition of Plavix.       Disposition and Follow-up: Discharge Instructions    Diet - low sodium heart healthy    Complete by:  As directed    Discharge instructions    Complete by:  As directed    You BP has been soft, so please HOLD amlodipine until you follow-up with your cardiologist. Check your BP daily and keep a log and so your doctor can adjust your medications.   Increase activity slowly    Complete by:  As directed        DISPOSITION: home    [place], MD. Schedule an appointment as soon as possible for a visit in 2 week(s).   Specialty:  Family Medicine Contact information: [address] [place] [address] [phone]        Yolonda Kida, MD. Schedule an appointment as soon as possible for a visit in 2 week(s).   Specialties:  Cardiology, Internal Medicine Contact information: [place] [address] [phone]            Time spent on Discharge: 35 mins   Signed:   Ellie Bryand M.D. [facility] 06/17/2016, 11:23 AM Pager: [phone]

## 2016-06-17 NOTE — Telephone Encounter (Signed)
Alinda Sierras from [facility] called and neded a HFU for pt. Pt was in for syncope and while in the hospital she had a heart cath. 7-10 day follow up, scheduled for 1/31 @ 11:30.

## 2016-06-19 NOTE — Telephone Encounter (Signed)
Transition Care Management Follow-up Telephone Call  How have you been since you were released from the hospital? "I have been doing fine and feeling good."   Do you understand why you were in the hospital? Yes, patient I fainted and was sent by EMS,patient she was advised was from coming off prednisone and loss of daughter-in-law.   Do you understand the discharge instrcutions? Yes  Items Reviewed:  Medications reviewed: Yes D/C Amlodipine and added Plavix  Allergies reviewed:Yes  Dietary changes reviewed: Yes  Referrals reviewed: Yes,   Functional Questionnaire:   Activities of Daily Living (ADLs):   She states they are independent in the following:   I can do everything for my self. States they require assistance with the following: No, assist needed.   Any transportation issues/concerns?: No   Any patient concerns? No.   Confirmed importance and date/time of follow-up visits scheduled: Yes   Confirmed with patient if condition begins to worsen call PCP or go to the ER.  Patient was given the Call-a-Nurse line [phone]: Yes

## 2016-06-24 ENCOUNTER — Ambulatory Visit: Payer: Self-pay | Admitting: Family Medicine

## 2016-06-30 ENCOUNTER — Ambulatory Visit (INDEPENDENT_AMBULATORY_CARE_PROVIDER_SITE_OTHER): Payer: Medicare Other | Admitting: Family Medicine

## 2016-06-30 ENCOUNTER — Telehealth: Payer: Self-pay | Admitting: Radiology

## 2016-06-30 ENCOUNTER — Encounter: Payer: Self-pay | Admitting: Family Medicine

## 2016-06-30 VITALS — BP 130/78 | HR 78 | Temp 98.4°F | Wt 195.6 lb

## 2016-06-30 DIAGNOSIS — R55 Syncope and collapse: Secondary | ICD-10-CM | POA: Diagnosis not present

## 2016-06-30 DIAGNOSIS — I10 Essential (primary) hypertension: Secondary | ICD-10-CM | POA: Diagnosis not present

## 2016-06-30 DIAGNOSIS — D649 Anemia, unspecified: Secondary | ICD-10-CM

## 2016-06-30 DIAGNOSIS — K2270 Barrett's esophagus without dysplasia: Secondary | ICD-10-CM

## 2016-06-30 DIAGNOSIS — I214 Non-ST elevation (NSTEMI) myocardial infarction: Secondary | ICD-10-CM | POA: Diagnosis not present

## 2016-06-30 DIAGNOSIS — F329 Major depressive disorder, single episode, unspecified: Secondary | ICD-10-CM | POA: Diagnosis not present

## 2016-06-30 DIAGNOSIS — F32A Depression, unspecified: Secondary | ICD-10-CM

## 2016-06-30 LAB — COMPREHENSIVE METABOLIC PANEL
ALBUMIN: 4.2 g/dL (ref 3.5–5.2)
ALK PHOS: 58 U/L (ref 39–117)
ALT: 11 U/L (ref 0–35)
AST: 12 U/L (ref 0–37)
BUN: 20 mg/dL (ref 6–23)
CALCIUM: 9.1 mg/dL (ref 8.4–10.5)
CHLORIDE: 108 meq/L (ref 96–112)
CO2: 26 mEq/L (ref 19–32)
Creatinine, Ser: 0.88 mg/dL (ref 0.40–1.20)
GFR: 67.79 mL/min (ref >60.00)
Glucose, Bld: 99 mg/dL (ref 70–99)
POTASSIUM: 5 meq/L (ref 3.5–5.1)
Sodium: 140 mEq/L (ref 135–145)
TOTAL PROTEIN: 6.9 g/dL (ref 6.0–8.3)
Total Bilirubin: 0.3 mg/dL (ref 0.2–1.2)

## 2016-06-30 LAB — CBC
HEMATOCRIT: 31.9 % — AB (ref 36.0–46.0)
HEMOGLOBIN: 9.5 g/dL — AB (ref 12.0–15.0)
MCHC: 29.9 g/dL — ABNORMAL LOW (ref 30.0–36.0)
MCV: 73.6 fl — AB (ref 78.0–100.0)
PLATELETS: 251 10*3/uL (ref 150.0–400.0)
RBC: 4.33 Mil/uL (ref 3.87–5.11)
RDW: 18.6 % — ABNORMAL HIGH (ref 11.5–15.5)
WBC: 5.9 10*3/uL (ref 4.0–10.5)

## 2016-06-30 MED ORDER — METOPROLOL SUCCINATE ER 50 MG PO TB24: 50.0000 mg | ORAL_TABLET | Freq: Every day | ORAL | 3 refills | Status: DC

## 2016-06-30 MED ORDER — ISOSORBIDE MONONITRATE ER 60 MG PO TB24: 60.0000 mg | ORAL_TABLET | Freq: Every day | ORAL | 1 refills | Status: DC

## 2016-06-30 MED ORDER — CLOPIDOGREL BISULFATE 75 MG PO TABS: 75.0000 mg | ORAL_TABLET | Freq: Every day | ORAL | 3 refills | Status: DC

## 2016-06-30 MED ORDER — PANTOPRAZOLE SODIUM 40 MG PO TBEC: 40.0000 mg | DELAYED_RELEASE_TABLET | Freq: Two times a day (BID) | ORAL | 2 refills | Status: DC

## 2016-06-30 MED ORDER — ROSUVASTATIN CALCIUM 10 MG PO TABS: 10.0000 mg | ORAL_TABLET | Freq: Every day | ORAL | 2 refills | Status: DC

## 2016-06-30 MED ORDER — SERTRALINE HCL 50 MG PO TABS: 50.0000 mg | ORAL_TABLET | Freq: Every day | ORAL | 3 refills | Status: DC

## 2016-06-30 NOTE — Assessment & Plan Note (Signed)
No recurrence. She had an extensive workup. Potentially vasovagal. She'll follow up with cardiology as scheduled. She'll continue to monitor for recurrence.

## 2016-06-30 NOTE — Patient Instructions (Signed)
Nice to see you. We will check some lab work today and contact you with the results. Please stop and get your stool cards at the front. If you develop chest pain, shortness of breath, syncope, or any new or changing symptoms please seek medical attention immediately.

## 2016-06-30 NOTE — Telephone Encounter (Signed)
Pt stated you wanted her to take home stool cards. Stool cards were given to pt, please place orders. Thank you.

## 2016-06-30 NOTE — Addendum Note (Signed)
Addended by: [name] on: 06/30/2016 05:13 PM   Modules accepted: Orders

## 2016-06-30 NOTE — Progress Notes (Signed)
Pre visit review using our clinic review tool, if applicable. No additional management support is needed unless otherwise documented below in the visit note. 

## 2016-06-30 NOTE — Assessment & Plan Note (Signed)
Noted to be anemic previously. Anemic in the hospital. We will recheck today and check stool cards.

## 2016-06-30 NOTE — Assessment & Plan Note (Signed)
Patient with NSTEMI in the hospital. Underwent cardiac catheterization. Currently taking Imdur, metoprolol, and Plavix. Also on aspirin. Asymptomatic. She'll follow-up with cardiology next week.

## 2016-06-30 NOTE — Telephone Encounter (Signed)
Order placed

## 2016-06-30 NOTE — Progress Notes (Signed)
Tommi Rumps, MD Phone: [phone]  Airlie Blumenberg Raphael is a 71 y.o. female who presents today for follow-up.  Patient presents for hospital follow-up. She was hospitalized for syncope from 06/15/16-06/17/16. Had a fairly extensive workup. Was found to have an NSTEMI. Underwent cardiac catheterization that revealed 3 out of 4 patent bypass grafts. They recommended medication management and added Plavix. She's been taking this. She's not had any chest pain or shortness of breath. She's not had any recurrence of her syncope. She has follow-up with cardiology next week. She was found to be anemic. She states they did not do a fecal occult blood test. She notes no blood in her stool. She's not been lightheaded. She feels significantly better since discharge. Medications were reviewed in full with patient. Discharge summary has been reviewed.  PMH: nonsmoker.   ROS see history of present illness  Objective  Physical Exam Vitals:   06/30/16 1118 06/30/16 1129  BP: (!) 158/86 130/78  Pulse: 78   Temp: 98.4 F (36.9 C)     BP Readings from Last 3 Encounters:  06/30/16 130/78  06/17/16 107/64  06/08/16 134/80   Wt Readings from Last 3 Encounters:  06/30/16 195 lb 9.6 oz (88.7 kg)  06/17/16 186 lb (84.4 kg)  06/08/16 189 lb 14.4 oz (86.1 kg)    Physical Exam  Constitutional: No distress.  HENT:  Head: Normocephalic and atraumatic.  Mouth/Throat: Oropharynx is clear and moist. No oropharyngeal exudate.  Eyes: Pupils are equal, round, and reactive to light.  Conjunctival pallor noted  Cardiovascular: Normal rate, regular rhythm and normal heart sounds.   Pulmonary/Chest: Effort normal and breath sounds normal.  Musculoskeletal: She exhibits no edema.  Neurological: She is alert. Gait normal.  Skin: Skin is warm and dry. She is not diaphoretic.     Assessment/Plan: Please see individual problem list.  NSTEMI (non-ST elevated myocardial infarction) [facility]) Patient with  NSTEMI in the hospital. Underwent cardiac catheterization. Currently taking Imdur, metoprolol, and Plavix. Also on aspirin. Asymptomatic. She'll follow-up with cardiology next week.  Syncope No recurrence. She had an extensive workup. Potentially vasovagal. She'll follow up with cardiology as scheduled. She'll continue to monitor for recurrence.  Anemia Noted to be anemic previously. Anemic in the hospital. We will recheck today and check stool cards.   Orders Placed This Encounter  Procedures  . CBC  . Comp Met (CMET)    Meds ordered this encounter  Medications  . clopidogrel (PLAVIX) 75 MG tablet    Sig: Take 1 tablet (75 mg total) by mouth daily.    Dispense:  75 tablet    Refill:  3  . isosorbide mononitrate (IMDUR) 60 MG 24 hr tablet    Sig: Take 1 tablet (60 mg total) by mouth daily.    Dispense:  90 tablet    Refill:  1  . metoprolol succinate (TOPROL-XL) 50 MG 24 hr tablet    Sig: Take 1 tablet (50 mg total) by mouth daily. Take with or immediately following a meal.    Dispense:  30 tablet    Refill:  3  . pantoprazole (PROTONIX) 40 MG tablet    Sig: Take 1 tablet (40 mg total) by mouth 2 (two) times daily.    Dispense:  60 tablet    Refill:  2  . sertraline (ZOLOFT) 50 MG tablet    Sig: Take 1 tablet (50 mg total) by mouth daily.    Dispense:  30 tablet    Refill:  3  .  rosuvastatin (CRESTOR) 10 MG tablet    Sig: Take 1 tablet (10 mg total) by mouth daily.    Dispense:  30 tablet    Refill:  2    Tommi Rumps, MD [place]

## 2016-07-01 ENCOUNTER — Encounter: Payer: Self-pay | Admitting: Family Medicine

## 2016-07-02 ENCOUNTER — Other Ambulatory Visit: Payer: Self-pay | Admitting: Family Medicine

## 2016-07-02 DIAGNOSIS — D649 Anemia, unspecified: Secondary | ICD-10-CM

## 2016-07-06 ENCOUNTER — Encounter: Payer: Self-pay | Admitting: Family Medicine

## 2016-07-06 DIAGNOSIS — R55 Syncope and collapse: Secondary | ICD-10-CM | POA: Diagnosis not present

## 2016-07-06 DIAGNOSIS — I2510 Atherosclerotic heart disease of native coronary artery without angina pectoris: Secondary | ICD-10-CM | POA: Diagnosis not present

## 2016-07-06 DIAGNOSIS — E785 Hyperlipidemia, unspecified: Secondary | ICD-10-CM | POA: Diagnosis not present

## 2016-07-06 DIAGNOSIS — I10 Essential (primary) hypertension: Secondary | ICD-10-CM | POA: Diagnosis not present

## 2016-07-06 DIAGNOSIS — K219 Gastro-esophageal reflux disease without esophagitis: Secondary | ICD-10-CM | POA: Diagnosis not present

## 2016-07-07 ENCOUNTER — Other Ambulatory Visit (INDEPENDENT_AMBULATORY_CARE_PROVIDER_SITE_OTHER): Payer: Medicare Other

## 2016-07-07 ENCOUNTER — Encounter: Payer: Self-pay | Admitting: Family Medicine

## 2016-07-07 DIAGNOSIS — D649 Anemia, unspecified: Secondary | ICD-10-CM

## 2016-07-07 LAB — CBC
HCT: 32 % — ABNORMAL LOW (ref 36.0–46.0)
HEMOGLOBIN: 9.6 g/dL — AB (ref 12.0–15.0)
MCHC: 30.1 g/dL (ref 30.0–36.0)
MCV: 71.7 fl — ABNORMAL LOW (ref 78.0–100.0)
Platelets: 271 10*3/uL (ref 150.0–400.0)
RBC: 4.47 Mil/uL (ref 3.87–5.11)
RDW: 18.4 % — AB (ref 11.5–15.5)
WBC: 6.6 10*3/uL (ref 4.0–10.5)

## 2016-07-08 ENCOUNTER — Telehealth: Payer: Self-pay | Admitting: *Deleted

## 2016-07-08 NOTE — Telephone Encounter (Signed)
See result note.  

## 2016-07-08 NOTE — Telephone Encounter (Signed)
Pt requested lab results  Contact [phone] Ext [phone]

## 2016-07-09 ENCOUNTER — Encounter: Payer: Self-pay | Admitting: Internal Medicine

## 2016-07-09 ENCOUNTER — Encounter: Payer: Self-pay | Admitting: Family Medicine

## 2016-07-09 ENCOUNTER — Telehealth: Payer: Self-pay | Admitting: Internal Medicine

## 2016-07-09 ENCOUNTER — Ambulatory Visit (INDEPENDENT_AMBULATORY_CARE_PROVIDER_SITE_OTHER): Payer: Medicare Other | Admitting: Internal Medicine

## 2016-07-09 VITALS — BP 138/82 | HR 81 | Ht 62.5 in | Wt 189.2 lb

## 2016-07-09 DIAGNOSIS — R55 Syncope and collapse: Secondary | ICD-10-CM | POA: Diagnosis not present

## 2016-07-09 MED ORDER — ASPIRIN EC 81 MG PO TBEC: 81.0000 mg | DELAYED_RELEASE_TABLET | Freq: Every day | ORAL | Status: AC

## 2016-07-09 MED ORDER — LISINOPRIL 5 MG PO TABS: 5.0000 mg | ORAL_TABLET | Freq: Every day | ORAL | 3 refills | Status: DC

## 2016-07-09 MED ORDER — DIAZEPAM 5 MG PO TABS: ORAL_TABLET | ORAL | 0 refills | Status: DC

## 2016-07-09 MED ORDER — POLYSACCHARIDE IRON COMPLEX 150 MG PO CAPS: 150.0000 mg | ORAL_CAPSULE | Freq: Two times a day (BID) | ORAL | 3 refills | Status: DC

## 2016-07-09 NOTE — Telephone Encounter (Signed)
Called the patient to find out the best day and time for her cardiac MRI.  I left my name and number for her to call back.

## 2016-07-09 NOTE — Progress Notes (Signed)
ELECTROPHYSIOLOGY CONSULT NOTE  Patient ID: Debbie Delacruz, MRN: [id number], DOB/AGE: Apr 21, 1946 70 y.o. Admit date: (Not on file) Date of Consult: 07/09/2016  Primary Physician: Debbie Rumps, MD Primary Cardiologist: Debbie Delacruz Consulting Physician Debbie Delacruz  Chief Complaint: syncope   HPI Debbie Delacruz is a 71 y.o. female  Referred for evaluation of recurrent syncope.  Her first episode occurred maybe 30 years ago following a barium enema. She has little recollection of associated symptoms. She has now had a half dozen episodes over the last 2 years. They're associated with a stereotypical prodrome with induration 3+ or so minutes. These include diaphoresis and flushing and lightheadedness. There is residual orthostatic intolerance flushing and diaphoresis.    She was admitted 06/15/16 admitted with syncope. This occurred in the wake of drug associated suicide of her daughter-in-law. She had also had an antecedent viral illness. She was found to have positive troponin  consistent with non-STEMI; catheterization demonstrated severe three-vessel disease and 3 out of 4 grafts patent including LIMA, SVG-OM, SVG-PD with posterolateral extension occluded  echocardiogram 1/18 LVEF normal inferoposterior hypokinesis. Orthostatic vital signs in the hospital were normal. Laboratories are notable for anemia with hematocrit of 29.5, this represented a significant change from 38 05/30/16.  Notes and laboratories to demonstrate a recurring severe anemia  She had another episode of syncope just earlier this week. Similar prodrome. She sent down for a few minutes and thought the symptoms had resolved. She stood got on the elevator and then lost consciousness in the elevator.  There was no associated viral illness.  She has been told of work that if she loses consciousness again she will lose her job   History of coronary artery disease-CABG 4 2016 with prior stenting.  Myoview scan prior to  revascularization demonstrated (by report)  she has severe hyperlipidemia  Surgical History:  Past Surgical History:  Procedure Laterality Date  . ABDOMINAL HYSTERECTOMY    . APPENDECTOMY    . BACK SURGERY    . BLADDER SURGERY    . CARDIAC CATHETERIZATION  06/2014  . CARDIAC CATHETERIZATION Debbie Delacruz 06/16/2016   Procedure: Left Heart Cath and Cors/Grafts Angiography;  Surgeon: Debbie Blanks, MD;  Location: [facility];  Service: Cardiovascular;  Laterality: Debbie Delacruz;  . CARPAL TUNNEL RELEASE Left   . CATARACT EXTRACTION W/ INTRAOCULAR LENS  IMPLANT, BILATERAL    . CHOLECYSTECTOMY    . COLONOSCOPY    . CORONARY ANGIOPLASTY WITH STENT PLACEMENT  1999   [facility] x2 stent  . CORONARY ARTERY BYPASS GRAFT Debbie Delacruz 09/18/2014   Procedure: CORONARY ARTERY BYPASS GRAFTING (CABG)times four using LIMA  to LAD:SVG to  PD and DIST RCA;SVG to OM;, EVH Right thigh;  Surgeon: Debbie Alberts, MD;  Location: [facility];  Service: Open Heart Surgery;  Laterality: Debbie Delacruz;  . ESOPHAGOGASTRODUODENOSCOPY Debbie Delacruz 09/17/2014   Procedure: ESOPHAGOGASTRODUODENOSCOPY (EGD);  Surgeon: Debbie Castle, MD;  Location: [facility];  Service: Endoscopy;  Laterality: Debbie Delacruz;  . heart stents     1999  . INCONTINENCE SURGERY    . INGUINAL HERNIA REPAIR    . INGUINAL HERNIA REPAIR Right X 3   "last one was in the 1990's; still have hernia there now" (09/13/2014)  . LAPAROSCOPIC CHOLECYSTECTOMY    . LUMBAR DISC SURGERY  1990's?  . LUMBAR SPINE SURGERY    . removal of ovaries    . TEE WITHOUT CARDIOVERSION Debbie Delacruz 09/18/2014   Procedure: TRANSESOPHAGEAL ECHOCARDIOGRAM (TEE);  Surgeon: Debbie Alberts, MD;  Location: [facility];  Service: Open Heart Surgery;  Laterality: Debbie Delacruz;  . UPPER GASTROINTESTINAL ENDOSCOPY    . VAGINAL HYSTERECTOMY       Home Meds: Prior to Admission medications   Medication Sig Start Date End Date Taking? Authorizing Provider  acetaminophen (TYLENOL) 500 MG tablet Take 500 mg by mouth every 6 (six) hours as needed for  headache.   Yes Historical Provider, MD  albuterol (PROVENTIL HFA;VENTOLIN HFA) 108 (90 Base) MCG/ACT inhaler Inhale 2 puffs into the lungs every 6 (six) hours as needed for wheezing or shortness of breath. 05/30/16  Yes Debbie Lot, MD  aspirin 325 MG tablet Take 325 mg by mouth daily.   Yes Historical Provider, MD  clopidogrel (PLAVIX) 75 MG tablet Take 1 tablet (75 mg total) by mouth daily. 06/30/16  Yes Debbie Haven, MD  isosorbide mononitrate (IMDUR) 60 MG 24 hr tablet Take 1 tablet (60 mg total) by mouth daily. 06/30/16  Yes Debbie Haven, MD  metoprolol succinate (TOPROL-XL) 50 MG 24 hr tablet Take 1 tablet (50 mg total) by mouth daily. Take with or immediately following a meal. 06/30/16  Yes Debbie Haven, MD  pantoprazole (PROTONIX) 40 MG tablet Take 1 tablet (40 mg total) by mouth 2 (two) times daily. 06/30/16  Yes Debbie Haven, MD  rosuvastatin (CRESTOR) 10 MG tablet Take 1 tablet (10 mg total) by mouth daily. 06/30/16  Yes Debbie Haven, MD  sertraline (ZOLOFT) 50 MG tablet Take 1 tablet (50 mg total) by mouth daily. 06/30/16  Yes Debbie Haven, MD    Allergies:  Allergies  Allergen Reactions  . Ferrous Sulfate [Ferrous Fumarate] Other (See Comments)    STOMACH ISSUES  . Lipitor [Atorvastatin] Other (See Comments)    myalgia    Social History   Social History  . Marital status: Married    Spouse name: Debbie Delacruz  . Number of children: Debbie Delacruz  . Years of education: Debbie Delacruz   Occupational History  . Not on file.   Social History Main Topics  . Smoking status: Never Smoker  . Smokeless tobacco: Never Used  . Alcohol use 0.0 oz/week     Comment: 09/13/2014 "might have a drink a couple times/yr"  . Drug use: No  . Sexual activity: Not Currently   Other Topics Concern  . Not on file   Social History Narrative   ** Merged History Encounter **       Lives in [place] with 38 YO nephew and husband. 3 dogs outside.  Work - Doctor, general practice, [employer]  Diet  - regular, limited meat  Exercise - walks some     Family History  Problem Relation Age of Onset  . Coronary artery disease Mother   . Osteoporosis Mother   . Heart disease Mother   . Stroke Mother   . Heart attack Mother   . Hypertension Mother   . Hypertension Father   . Coronary artery disease Father   . COPD Father   . Heart disease Father   . Heart attack Father   . Glaucoma Father   . Osteoporosis Father   . Emphysema Father   . Cancer Sister 58    colon  . Hypertension Sister   . Colon cancer Sister     dx in her 8's  . Breast cancer Sister   . Stroke Brother   . Hypertension Brother   . Stroke Maternal Grandfather   . Stroke Maternal Grandmother   . Osteoporosis  Maternal Grandmother   . Hypertension Maternal Grandmother   . Hypertension Paternal Grandmother   . Hypertension Paternal Grandfather   . Breast cancer Maternal Aunt     2 aunts-50's  . Esophageal cancer Neg Hx   . Stomach cancer Neg Hx   . Rectal cancer Neg Hx   . Pancreatic cancer Neg Hx      ROS:  Please see the history of present illness.     All other systems reviewed and negative.    Physical Exam: Blood pressure 138/82, pulse 81, height 5' 2.5" (1.588 m), weight 189 lb 4 oz (85.8 kg). General: Well developed, well nourished female in no acute distress. Head: Normocephalic, atraumatic, sclera non-icteric, no xanthomas, nares are without discharge. EENT: normal  Lymph Nodes:  none Neck: Negative for carotid bruits. JVD not elevated. Back:without scoliosis kyphosis Lungs: Clear bilaterally to auscultation without wheezes, rales, or rhonchi. Breathing is unlabored. Heart: RRR with S1 S2. No  murmur . No rubs, or gallops appreciated. Abdomen: Soft, non-tender, non-distended with normoactive bowel sounds. No hepatomegaly. No rebound/guarding. No obvious abdominal masses. Msk:  Strength and tone appear normal for age. Extremities: No clubbing or cyanosis. No  edema.  Distal pedal pulses are  2+ and equal bilaterally. Skin: Warm and Dry Neuro: Alert and oriented X 3. CN III-XII intact Grossly normal sensory and motor function . Psych:  Responds to questions appropriately with a normal affect.      Labs: Cardiac Enzymes No results for input(s): CKTOTAL, CKMB, TROPONINI in the last 72 hours. CBC Lab Results  Component Value Date   WBC 6.6 07/07/2016   HGB 9.6 (L) 07/07/2016   HCT 32.0 (L) 07/07/2016   MCV 71.7 (L) 07/07/2016   PLT 271.0 07/07/2016   PROTIME: No results for input(s): LABPROT, INR in the last 72 hours. Chemistry No results for input(s): NA, K, CL, CO2, BUN, CREATININE, CALCIUM, PROT, BILITOT, ALKPHOS, ALT, AST, GLUCOSE in the last 168 hours.  Invalid input(s): LABALBU Lipids Lab Results  Component Value Date   CHOL 307 (H) 10/09/2015   HDL 37.30 (L) 10/09/2015   LDLCALC 121 (H) 09/14/2014   TRIG 231.0 (H) 10/09/2015   BNP No results found for: PROBNP Thyroid Function Tests: No results for input(s): TSH, T4TOTAL, T3FREE, THYROIDAB in the last 72 hours.  Invalid input(s): FREET3 Miscellaneous No results found for: DDIMER  Radiology/Studies:  Ct Head Wo Contrast  Result Date: 06/16/2016 CLINICAL DATA:  Initial evaluation for acute syncope. EXAM: CT HEAD WITHOUT CONTRAST TECHNIQUE: Contiguous axial images were obtained from the base of the skull through the vertex without intravenous contrast. COMPARISON:  Prior CT from 11/03/2013. FINDINGS: Brain: Age appropriate cerebral atrophy with mild chronic microvascular ischemic disease. No acute intracranial hemorrhage. No evidence for acute large vessel territory infarct. No mass lesion, midline shift, or mass effect. No hydrocephalus. No extra-axial fluid collection. Vascular: No hyperdense vessel. Scattered vascular calcifications noted within the carotid siphons. Skull: Scalp soft tissues within normal limits.  Calvarium intact. Sinuses/Orbits: Globes and orbits within normal limits. Patient is status  post lens extraction bilaterally. Mild mucosal thickening within the maxillary sinuses and ethmoidal air cells. Paranasal sinuses are otherwise clear. Small right mastoid effusion noted. IMPRESSION: No acute intracranial process. Electronically Signed   By: Jeannine Boga M.D.   On: 06/16/2016 05:24    EKG: Sinus rhythm at 81 Intervals 05/02/39   Assessment and Plan:  Syncope  Coronary artery disease with prior bypass grafting and inferior wall hypokinesis; normal LV  function  Anemia-iron deficiency  Hypertension   Dual antiplatelet therapy with high-dose aspirin    The patient's syncope is almost certainly neurally mediated based upon the prodrome and the predictable epiphenomena. The duration and the residual orthostatic intolerance also support this diagnosis.  We discussed extensively the issues of dysautonomia, the physiology of orthstasis and positional stress.  We discussed the role of salt and water repletion, the importance of exercise, often needing to be started in the recumbent position, and the awareness of triggers and the role of ambient heat and dehydration  I suggested that she get [facility] that may further help.  We will also discontinue her indoor; this antedates her bypass. We will use lisinopril 5 mg for her low pressure and check a metabolic profile in 2 weeks  As she has a history of a wall motion abnormality, and a technical issues of Myoview scanning here are as they are, we will undertake cardiac MRI to look for a scar that might give rise to a substrate for ventricular tachycardia. The event that this is found she would need to undergo EP testing.  It is really important for her to treat her iron deficiency. She is not taking ferrous sulfate because of GI problems. I have talked with Dr. Karl [facility] pharmacy; we will begin her on Niferex 150 twice a day.  I also spoke with Dr. Ennis Forts regarding her DAPT we will decrease her aspirin from 325--81 and  discontinue her Plavix.      Virl Axe

## 2016-07-09 NOTE — Patient Instructions (Addendum)
Medication Instructions: - Your physician has recommended you make the following change in your medication:  1) Stop plavix 2) Stop imdur (isosorbide)  3) Decrease aspirin to 81 mg once daily 4) Start lisinopril 5 mg- take one tablet by mouth once daily 5) Start niferex 150 mg- take one tablet by mouth twice daily  Labwork: - none ordered  Procedures/Testing: - Your physician has requested that you have a cardiac MRI. Cardiac MRI uses a computer to create images of your heart as its beating, producing both still and moving pictures of your heart and major blood vessels. For further information please visit [email or web address]   ** [place] office will call you to arrange for this to be done at [facility]** - you are also being given a prescription for Valium 5 mg- to take one tablet on arrival for your MRI - someone will need to drive you home  Follow-Up: - pending results of your Cardiac MRI  Any Additional Special Instructions Will Be Listed Below (If Applicable).     If you need a refill on your cardiac medications before your next appointment, please call your pharmacy.

## 2016-07-09 NOTE — Addendum Note (Signed)
Addended byAlvis Lemmings C on: 07/09/2016 11:05 AM   Modules accepted: Orders

## 2016-07-10 ENCOUNTER — Telehealth: Payer: Self-pay | Admitting: Family Medicine

## 2016-07-10 DIAGNOSIS — D649 Anemia, unspecified: Secondary | ICD-10-CM

## 2016-07-10 NOTE — Telephone Encounter (Signed)
Dr. Caryl Bis sent me a msg pt needs to be scheduled for labs. Orders needed so I can schedule pt Please and thank you!

## 2016-07-10 NOTE — Telephone Encounter (Signed)
Please place order and send back to [facility]

## 2016-07-10 NOTE — Telephone Encounter (Signed)
Orders placed.

## 2016-07-11 ENCOUNTER — Encounter: Payer: Self-pay | Admitting: Family Medicine

## 2016-07-14 ENCOUNTER — Encounter: Payer: Self-pay | Admitting: Family Medicine

## 2016-07-15 ENCOUNTER — Encounter: Payer: Self-pay | Admitting: Internal Medicine

## 2016-07-15 ENCOUNTER — Encounter: Payer: Self-pay | Admitting: Family Medicine

## 2016-07-27 ENCOUNTER — Ambulatory Visit (HOSPITAL_COMMUNITY): Payer: Medicare Other | Attending: Internal Medicine

## 2016-07-27 ENCOUNTER — Other Ambulatory Visit (HOSPITAL_COMMUNITY): Payer: Self-pay

## 2016-07-31 ENCOUNTER — Other Ambulatory Visit: Payer: Self-pay

## 2016-08-03 ENCOUNTER — Telehealth: Payer: Self-pay

## 2016-08-03 DIAGNOSIS — D509 Iron deficiency anemia, unspecified: Secondary | ICD-10-CM

## 2016-08-03 NOTE — Telephone Encounter (Signed)
Left message to return call 

## 2016-08-03 NOTE — Telephone Encounter (Signed)
-----   Message from [name], MD sent at 08/02/2016 11:43 AM EDT ----- Regarding: mychart message Please see if she is tolerating her iron medication and if she got the most recent mychart message. Thanks. Eric.

## 2016-08-04 ENCOUNTER — Telehealth: Payer: Self-pay | Admitting: Cardiology

## 2016-08-04 ENCOUNTER — Encounter: Payer: Self-pay | Admitting: Internal Medicine

## 2016-08-04 NOTE — Telephone Encounter (Signed)
Called the patient and gave her the information for her cardiac MRI.  Letter mailed to her.  Message to nurse about rescheduled test.

## 2016-08-04 NOTE — Telephone Encounter (Signed)
Pt called back returning your call. Thank you!  Call pt @ [phone] ext [phone].

## 2016-08-04 NOTE — Telephone Encounter (Signed)
Given her intolerance of oral iron supplements and her iron deficiency I would suggest seeing hematology to consider IV iron supplementation. I can place referral if she is willing. Thanks.

## 2016-08-04 NOTE — Telephone Encounter (Signed)
Patient states she will call [facility] for referral, patient states the iron  Is causing nausea and stomach pain.

## 2016-08-04 NOTE — Telephone Encounter (Signed)
Patient would like to see hematology

## 2016-08-04 NOTE — Telephone Encounter (Signed)
Referral placed.

## 2016-08-10 ENCOUNTER — Ambulatory Visit (HOSPITAL_COMMUNITY): Payer: Medicare Other

## 2016-08-19 ENCOUNTER — Inpatient Hospital Stay: Payer: Medicare Other | Admitting: Oncology

## 2016-08-24 ENCOUNTER — Ambulatory Visit (HOSPITAL_COMMUNITY): Admission: RE | Admit: 2016-08-24 | Payer: Medicare Other | Source: Ambulatory Visit

## 2016-08-27 ENCOUNTER — Encounter: Payer: Self-pay | Admitting: Family Medicine

## 2016-08-27 ENCOUNTER — Ambulatory Visit (INDEPENDENT_AMBULATORY_CARE_PROVIDER_SITE_OTHER): Payer: Medicare Other | Admitting: Family Medicine

## 2016-08-27 VITALS — BP 130/76 | HR 92 | Temp 99.0°F | Wt 195.8 lb

## 2016-08-27 DIAGNOSIS — F418 Other specified anxiety disorders: Secondary | ICD-10-CM | POA: Diagnosis not present

## 2016-08-27 DIAGNOSIS — R202 Paresthesia of skin: Secondary | ICD-10-CM

## 2016-08-27 DIAGNOSIS — D509 Iron deficiency anemia, unspecified: Secondary | ICD-10-CM | POA: Diagnosis not present

## 2016-08-27 DIAGNOSIS — R55 Syncope and collapse: Secondary | ICD-10-CM

## 2016-08-27 DIAGNOSIS — F32A Depression, unspecified: Secondary | ICD-10-CM

## 2016-08-27 DIAGNOSIS — F419 Anxiety disorder, unspecified: Secondary | ICD-10-CM

## 2016-08-27 DIAGNOSIS — F329 Major depressive disorder, single episode, unspecified: Secondary | ICD-10-CM | POA: Diagnosis not present

## 2016-08-27 LAB — CBC WITH DIFFERENTIAL/PLATELET
BASOS PCT: 0.6 % (ref 0.0–3.0)
Basophils Absolute: 0 10*3/uL (ref 0.0–0.1)
EOS PCT: 5.2 % — AB (ref 0.0–5.0)
Eosinophils Absolute: 0.3 10*3/uL (ref 0.0–0.7)
HEMATOCRIT: 32.9 % — AB (ref 36.0–46.0)
HEMOGLOBIN: 9.7 g/dL — AB (ref 12.0–15.0)
Lymphocytes Relative: 15.5 % (ref 12.0–46.0)
Lymphs Abs: 1 10*3/uL (ref 0.7–4.0)
MCHC: 29.4 g/dL — ABNORMAL LOW (ref 30.0–36.0)
MCV: 66.4 fl — ABNORMAL LOW (ref 78.0–100.0)
MONOS PCT: 10.7 % (ref 3.0–12.0)
Monocytes Absolute: 0.7 10*3/uL (ref 0.1–1.0)
NEUTROS ABS: 4.2 10*3/uL (ref 1.4–7.7)
Neutrophils Relative %: 68 % (ref 43.0–77.0)
Platelets: 285 10*3/uL (ref 150.0–400.0)
RBC: 4.95 Mil/uL (ref 3.87–5.11)
RDW: 20.5 % — AB (ref 11.5–15.5)
WBC: 6.1 10*3/uL (ref 4.0–10.5)

## 2016-08-27 LAB — COMPREHENSIVE METABOLIC PANEL
ALBUMIN: 4.1 g/dL (ref 3.5–5.2)
ALK PHOS: 63 U/L (ref 39–117)
ALT: 9 U/L (ref 0–35)
AST: 11 U/L (ref 0–37)
BUN: 20 mg/dL (ref 6–23)
CALCIUM: 9 mg/dL (ref 8.4–10.5)
CO2: 25 mEq/L (ref 19–32)
CREATININE: 0.88 mg/dL (ref 0.40–1.20)
Chloride: 108 mEq/L (ref 96–112)
GFR: 67.76 mL/min (ref >60.00)
Glucose, Bld: 125 mg/dL — ABNORMAL HIGH (ref 70–99)
POTASSIUM: 4.8 meq/L (ref 3.5–5.1)
SODIUM: 140 meq/L (ref 135–145)
TOTAL PROTEIN: 6.7 g/dL (ref 6.0–8.3)
Total Bilirubin: 0.4 mg/dL (ref 0.2–1.2)

## 2016-08-27 LAB — FOLATE: FOLATE: 9.7 ng/mL (ref >5.9)

## 2016-08-27 LAB — TSH: TSH: 1.74 u[IU]/mL (ref 0.35–4.50)

## 2016-08-27 LAB — VITAMIN B12: VITAMIN B 12: 208 pg/mL — AB (ref 211–911)

## 2016-08-27 MED ORDER — SERTRALINE HCL 100 MG PO TABS: 100.0000 mg | ORAL_TABLET | Freq: Every day | ORAL | 1 refills | Status: DC

## 2016-08-27 NOTE — Progress Notes (Signed)
  Tommi Rumps, MD Phone: [phone]  Debbie Delacruz is a 71 y.o. female who presents today for follow-up.  Syncope: Patient has not had any recurrent syncope since she last saw cardiology in February. She is scheduled to have an MRI to evaluate her cardiac muscle. She's currently on metoprolol and lisinopril. Notes prior to her syncopal episode she'll get hot and nauseous and then pass out. Only occurs at work.  Iron deficiency anemia: She notes no bleeding. She is due to see hematology to consider iron supplementation as she has been unable to tolerate several formulations of iron supplementation due to stomach discomfort.  Depression and anxiety: She notes no depression. Notes it's all anxiety. Notes it is worse because of work. She notes she hates to go to work in the morning. Zoloft does not been as beneficial. Does have a sensation of formication in her legs at times and does scratch them.  PMH: nonsmoker.   ROS see history of present illness  Objective  Physical Exam Vitals:   08/27/16 0801  BP: 130/76  Pulse: 92  Temp: 99 F (37.2 C)    BP Readings from Last 3 Encounters:  08/27/16 130/76  07/09/16 138/82  06/30/16 130/78   Wt Readings from Last 3 Encounters:  08/27/16 195 lb 12.8 oz (88.8 kg)  07/09/16 189 lb 4 oz (85.8 kg)  06/30/16 195 lb 9.6 oz (88.7 kg)    Physical Exam  Constitutional: No distress.  HENT:  Head: Normocephalic and atraumatic.  Cardiovascular: Normal rate, regular rhythm and normal heart sounds.   Pulmonary/Chest: Effort normal and breath sounds normal.  Neurological: She is alert. Gait normal.  Skin: Skin is warm and dry. She is not diaphoretic.  Excoriated lesions on right lower leg laterally with no apparent rash     Assessment/Plan: Please see individual problem list.  Anxiety and depression Anxiety is worsening. No symptoms of depression. Discussed options of seeing a therapist or increasing her Zoloft. Opted increase  Zoloft. This was sent her pharmacy.  Formication Patient notes symptoms of formication in her legs with her anxiety. Suspect related to her psychological issues though we will obtain lab work as outlined below to evaluate for other causes. There is no obvious abnormalities other than excoriations in her right leg. She's not noticed any bugs. She's not had any recent travel.  Syncope No recent recurrence. She is due to have an MRI through cardiology. She will reschedule this as she missed her appointment earlier this week.  Anemia Has not been able to tolerate iron supplementation. She notes no bleeding. She has an appointment with hematology next week to discuss potential for other iron treatments.   Orders Placed This Encounter  Procedures  . CBC w/Diff  . TSH  . Comp Met (CMET)  . B12  . Folate    Meds ordered this encounter  Medications  . sertraline (ZOLOFT) 100 MG tablet    Sig: Take 1 tablet (100 mg total) by mouth daily.    Dispense:  90 tablet    Refill:  1    Tommi Rumps, MD [place]

## 2016-08-27 NOTE — Assessment & Plan Note (Signed)
Has not been able to tolerate iron supplementation. She notes no bleeding. She has an appointment with hematology next week to discuss potential for other iron treatments.

## 2016-08-27 NOTE — Assessment & Plan Note (Signed)
Patient notes symptoms of formication in her legs with her anxiety. Suspect related to her psychological issues though we will obtain lab work as outlined below to evaluate for other causes. There is no obvious abnormalities other than excoriations in her right leg. She's not noticed any bugs. She's not had any recent travel.

## 2016-08-27 NOTE — Patient Instructions (Signed)
Nice to see you. Please reschedule your MRI. We will check some lab work today and contact you with the results. We will increase your Zoloft to 100 mg daily.

## 2016-08-27 NOTE — Assessment & Plan Note (Signed)
No recent recurrence. She is due to have an MRI through cardiology. She will reschedule this as she missed her appointment earlier this week.

## 2016-08-27 NOTE — Assessment & Plan Note (Signed)
Anxiety is worsening. No symptoms of depression. Discussed options of seeing a therapist or increasing her Zoloft. Opted increase Zoloft. This was sent her pharmacy.

## 2016-08-27 NOTE — Progress Notes (Signed)
Pre visit review using our clinic review tool, if applicable. No additional management support is needed unless otherwise documented below in the visit note. 

## 2016-08-28 ENCOUNTER — Telehealth: Payer: Self-pay | Admitting: Family Medicine

## 2016-08-28 NOTE — Telephone Encounter (Signed)
Pt called back returning your call in regards to results. Thank you!  Call pt @ [phone] ext [phone]

## 2016-08-28 NOTE — Telephone Encounter (Signed)
Patient notified of lab results

## 2016-08-31 ENCOUNTER — Other Ambulatory Visit: Payer: Self-pay | Admitting: Family Medicine

## 2016-08-31 DIAGNOSIS — E538 Deficiency of other specified B group vitamins: Secondary | ICD-10-CM

## 2016-09-01 ENCOUNTER — Encounter: Payer: Self-pay | Admitting: Internal Medicine

## 2016-09-01 ENCOUNTER — Telehealth: Payer: Self-pay | Admitting: *Deleted

## 2016-09-01 ENCOUNTER — Ambulatory Visit: Payer: Medicare Other | Admitting: Oncology

## 2016-09-01 NOTE — Telephone Encounter (Signed)
I'll email her back, but Dr. Clayborn Bigness sent her to [place].  He should probably refill this.

## 2016-09-03 NOTE — Telephone Encounter (Signed)
Patient Email Open    09/01/2016 [place]  Deboraha Sprang, MD  Cardiology   Conversation: Non-Urgent Medical Question  (Newest Message First)  Emily Filbert, RN  to Debbie Delacruz       09/01/16 2:26 PM  [name],   If you are continuing to follow with Dr. Clayborn Bigness, then the refill for the [facility] spray should come from him as your primary cardiologist.  Just let me know if you have any trouble getting this from him.    Thank you!  Alvis Lemmings, RN, BSN    Last read by [name] at 3:52 PM on 09/01/2016.

## 2016-09-14 ENCOUNTER — Inpatient Hospital Stay: Payer: Medicare Other | Attending: Oncology | Admitting: Oncology

## 2016-09-14 ENCOUNTER — Inpatient Hospital Stay: Payer: Medicare Other

## 2016-09-14 VITALS — BP 139/70 | HR 102 | Temp 98.4°F | Resp 18 | Wt 192.3 lb

## 2016-09-14 DIAGNOSIS — G4730 Sleep apnea, unspecified: Secondary | ICD-10-CM | POA: Insufficient documentation

## 2016-09-14 DIAGNOSIS — E669 Obesity, unspecified: Secondary | ICD-10-CM | POA: Insufficient documentation

## 2016-09-14 DIAGNOSIS — Z7982 Long term (current) use of aspirin: Secondary | ICD-10-CM | POA: Diagnosis not present

## 2016-09-14 DIAGNOSIS — R5383 Other fatigue: Secondary | ICD-10-CM | POA: Insufficient documentation

## 2016-09-14 DIAGNOSIS — K219 Gastro-esophageal reflux disease without esophagitis: Secondary | ICD-10-CM | POA: Insufficient documentation

## 2016-09-14 DIAGNOSIS — R739 Hyperglycemia, unspecified: Secondary | ICD-10-CM

## 2016-09-14 DIAGNOSIS — E785 Hyperlipidemia, unspecified: Secondary | ICD-10-CM | POA: Insufficient documentation

## 2016-09-14 DIAGNOSIS — F419 Anxiety disorder, unspecified: Secondary | ICD-10-CM | POA: Diagnosis not present

## 2016-09-14 DIAGNOSIS — I252 Old myocardial infarction: Secondary | ICD-10-CM

## 2016-09-14 DIAGNOSIS — Z79899 Other long term (current) drug therapy: Secondary | ICD-10-CM

## 2016-09-14 DIAGNOSIS — F329 Major depressive disorder, single episode, unspecified: Secondary | ICD-10-CM | POA: Diagnosis not present

## 2016-09-14 DIAGNOSIS — K449 Diaphragmatic hernia without obstruction or gangrene: Secondary | ICD-10-CM | POA: Diagnosis not present

## 2016-09-14 DIAGNOSIS — D509 Iron deficiency anemia, unspecified: Secondary | ICD-10-CM | POA: Diagnosis not present

## 2016-09-14 DIAGNOSIS — R531 Weakness: Secondary | ICD-10-CM | POA: Diagnosis not present

## 2016-09-14 DIAGNOSIS — Z800 Family history of malignant neoplasm of digestive organs: Secondary | ICD-10-CM | POA: Insufficient documentation

## 2016-09-14 DIAGNOSIS — I10 Essential (primary) hypertension: Secondary | ICD-10-CM | POA: Diagnosis not present

## 2016-09-14 DIAGNOSIS — Z803 Family history of malignant neoplasm of breast: Secondary | ICD-10-CM | POA: Diagnosis not present

## 2016-09-14 DIAGNOSIS — I2510 Atherosclerotic heart disease of native coronary artery without angina pectoris: Secondary | ICD-10-CM | POA: Diagnosis not present

## 2016-09-14 LAB — CBC
HCT: 29.6 % — ABNORMAL LOW (ref 35.0–47.0)
HEMOGLOBIN: 9 g/dL — AB (ref 12.0–16.0)
MCH: 19.7 pg — AB (ref 26.0–34.0)
MCHC: 30.4 g/dL — AB (ref 32.0–36.0)
MCV: 64.8 fL — ABNORMAL LOW (ref 80.0–100.0)
Platelets: 327 10*3/uL (ref 150–440)
RBC: 4.57 MIL/uL (ref 3.80–5.20)
RDW: 20.8 % — AB (ref 11.5–14.5)
WBC: 5.7 10*3/uL (ref 3.6–11.0)

## 2016-09-14 LAB — IRON AND TIBC
Iron: 19 ug/dL — ABNORMAL LOW (ref 28–170)
Saturation Ratios: 4 % — ABNORMAL LOW (ref 10.4–31.8)
TIBC: 502 ug/dL — AB (ref 250–450)
UIBC: 484 ug/dL

## 2016-09-14 LAB — FERRITIN: Ferritin: 4 ng/mL — ABNORMAL LOW (ref 11–307)

## 2016-09-14 LAB — RETICULOCYTES
RBC.: 4.62 MIL/uL (ref 3.80–5.20)
Retic Count, Absolute: 97 10*3/uL (ref 19.0–183.0)
Retic Ct Pct: 2.1 % (ref 0.4–3.1)

## 2016-09-14 LAB — DAT, POLYSPECIFIC AHG (ARMC ONLY): Polyspecific AHG test: NEGATIVE

## 2016-09-14 LAB — LACTATE DEHYDROGENASE: LDH: 148 U/L (ref 98–192)

## 2016-09-14 LAB — VITAMIN B12: Vitamin B-12: 491 pg/mL (ref 180–914)

## 2016-09-14 NOTE — Progress Notes (Signed)
New evaluation for anemia. Complains of decreased energy levels and feeling tired all the time. Cannot tolerate oral iron.

## 2016-09-14 NOTE — Progress Notes (Signed)
[place]  [phone]) [phone] [phone]) [phone]  ID: [name] OB: [birth year]  MR#: [id number]  KGU#:[id number]  Patient Care Team: [name], MD as PCP - General (Family Medicine) [name], MD (Internal Medicine) [name], MD as Consulting Physician (Cardiothoracic Surgery) [name], MD as Consulting Physician (Cardiology)  CHIEF COMPLAINT: Iron deficiency anemia.  INTERVAL HISTORY: Patient is a 71 year old female who was noted to have a declining hemoglobin and worsening iron stores. She could not tolerate multiple formulations of oral iron supplementation. She currently feels weak and fatigued, but otherwise feels well. She has no neurologic complaints. She denies any recent fevers or illnesses. She has a good appetite and denies weight loss. She has no chest pain or shortness of breath. She denies any nausea, vomiting, constipation, or diarrhea. She denies any melena or hematochezia. She has no urinary complaints. Patient otherwise feels well and offers no further specific complaints.  REVIEW OF SYSTEMS:   Review of Systems  Constitutional: Positive for malaise/fatigue. Negative for fever and weight loss.  Respiratory: Negative.  Negative for cough and shortness of breath.   Cardiovascular: Negative.  Negative for chest pain and leg swelling.  Gastrointestinal: Negative.  Negative for abdominal pain, blood in stool and melena.  Genitourinary: Negative.   Musculoskeletal: Negative.   Skin: Negative.  Negative for rash.  Neurological: Positive for weakness.  Psychiatric/Behavioral: Negative.  The patient is not nervous/anxious.     As per HPI. Otherwise, a complete review of systems is negative.  PAST MEDICAL HISTORY: Past Medical History:  Diagnosis Date  . Anemia   . Anginal pain ([place])   . Anxiety   . Arthritis   . CAD (coronary artery disease)   . Carpal tunnel syndrome    bilateral  . Cataract    bil  removed cataracts  . Coronary artery disease    2x stents, Dr. Clayborn Bigness  . Depression   . Dysrhythmia   . Family history of adverse reaction to anesthesia    "brother; S/P lipotripsy in [place]; transferred to [facility]; had to put him on life support for 2 days"  . GERD (gastroesophageal reflux disease)   . Headache    "weekly" (09/13/2014)  . History of blood transfusion 05/2014   "we haven't figured out why I needed it yet" (09/13/2014)  . History of hiatal hernia   . Hyperglycemia   . Hyperlipidemia   . Hypertension   . Myocardial infarct ([place])   . Myocardial infarct, old 1999   15 years ago  . Obesity   . PONV (postoperative nausea and vomiting)   . S/P CABG x 4 09/18/2014   LIMA to LAD, SVG to PDA-dRCA sequentially, SVG to OM, EVH via right thigh   . Sleep apnea   . Sleep apnea    "don't use a mask" (09/13/2014)  . Unstable angina ([place])     PAST SURGICAL HISTORY: Past Surgical History:  Procedure Laterality Date  . ABDOMINAL HYSTERECTOMY    . APPENDECTOMY    . BACK SURGERY    . BLADDER SURGERY    . CARDIAC CATHETERIZATION  06/2014  . CARDIAC CATHETERIZATION N/A 06/16/2016   Procedure: Left Heart Cath and Cors/Grafts Angiography;  Surgeon: Burnell Blanks, MD;  Location: [place] [facility];  Service: Cardiovascular;  Laterality: N/A;  . CARPAL TUNNEL RELEASE Left   . CATARACT EXTRACTION W/ INTRAOCULAR LENS  IMPLANT, BILATERAL    . CHOLECYSTECTOMY    . COLONOSCOPY    .  CORONARY ANGIOPLASTY WITH STENT PLACEMENT  1999   [facility] x2 stent  . CORONARY ARTERY BYPASS GRAFT N/A 09/18/2014   Procedure: CORONARY ARTERY BYPASS GRAFTING (CABG)times four using LIMA  to LAD:SVG to  PD and DIST RCA;SVG to OM;, EVH Right thigh;  Surgeon: [name], MD;  Location: [place];  Service: Open Heart Surgery;  Laterality: N/A;  . ESOPHAGOGASTRODUODENOSCOPY N/A 09/17/2014   Procedure: ESOPHAGOGASTRODUODENOSCOPY (EGD);  Surgeon: Inda Castle, MD;  Location: [place];  Service:  Endoscopy;  Laterality: N/A;  . heart stents     1999  . INCONTINENCE SURGERY    . INGUINAL HERNIA REPAIR    . INGUINAL HERNIA REPAIR Right X 3   "last one was in the 1990's; still have hernia there now" (09/13/2014)  . LAPAROSCOPIC CHOLECYSTECTOMY    . LUMBAR DISC SURGERY  1990's?  . LUMBAR SPINE SURGERY    . removal of ovaries    . TEE WITHOUT CARDIOVERSION N/A 09/18/2014   Procedure: TRANSESOPHAGEAL ECHOCARDIOGRAM (TEE);  Surgeon: [name], MD;  Location: [place];  Service: Open Heart Surgery;  Laterality: N/A;  . UPPER GASTROINTESTINAL ENDOSCOPY    . VAGINAL HYSTERECTOMY      FAMILY HISTORY: Family History  Problem Relation Age of Onset  . Coronary artery disease Mother   . Osteoporosis Mother   . Heart disease Mother   . Stroke Mother   . Heart attack Mother   . Hypertension Mother   . Hypertension Father   . Coronary artery disease Father   . COPD Father   . Heart disease Father   . Heart attack Father   . Glaucoma Father   . Osteoporosis Father   . Emphysema Father   . Cancer Sister 47    colon  . Hypertension Sister   . Colon cancer Sister     dx in her 6's  . Breast cancer Sister   . Stroke Brother   . Hypertension Brother   . Stroke Maternal Grandfather   . Stroke Maternal Grandmother   . Osteoporosis Maternal Grandmother   . Hypertension Maternal Grandmother   . Hypertension Paternal Grandmother   . Hypertension Paternal Grandfather   . Breast cancer Maternal Aunt     2 aunts-50's  . Esophageal cancer Neg Hx   . Stomach cancer Neg Hx   . Rectal cancer Neg Hx   . Pancreatic cancer Neg Hx     ADVANCED DIRECTIVES (Y/N):  N  HEALTH MAINTENANCE: Social History  Substance Use Topics  . Smoking status: Never Smoker  . Smokeless tobacco: Never Used  . Alcohol use 0.0 oz/week     Comment: 09/13/2014 "might have a drink a couple times/yr"     Colonoscopy:  PAP:  Bone density:  Lipid panel:  Allergies  Allergen Reactions  . Ferrous Sulfate  [Ferrous Fumarate] Other (See Comments)    STOMACH ISSUES  . Lipitor [Atorvastatin] Other (See Comments)    myalgia    Current Outpatient Prescriptions  Medication Sig Dispense Refill  . acetaminophen (TYLENOL) 500 MG tablet Take 500 mg by mouth every 6 (six) hours as needed for headache.    . albuterol (PROVENTIL HFA;VENTOLIN HFA) 108 (90 Base) MCG/ACT inhaler Inhale 2 puffs into the lungs every 6 (six) hours as needed for wheezing or shortness of breath. 1 Inhaler 2  . aspirin EC 81 MG tablet Take 1 tablet (81 mg total) by mouth daily.    . diazepam (VALIUM) 5 MG tablet Take one tablet (  5 mg) on arrival for your MRI 1 tablet 0  . lisinopril (PRINIVIL,ZESTRIL) 5 MG tablet Take 1 tablet (5 mg total) by mouth daily. 90 tablet 3  . metoprolol succinate (TOPROL-XL) 50 MG 24 hr tablet Take 1 tablet (50 mg total) by mouth daily. Take with or immediately following a meal. 30 tablet 3  . pantoprazole (PROTONIX) 40 MG tablet Take 1 tablet (40 mg total) by mouth 2 (two) times daily. 60 tablet 2  . rosuvastatin (CRESTOR) 10 MG tablet Take 1 tablet (10 mg total) by mouth daily. 30 tablet 2  . sertraline (ZOLOFT) 100 MG tablet Take 1 tablet (100 mg total) by mouth daily. 90 tablet 1   No current facility-administered medications for this visit.     OBJECTIVE: Vitals:   09/14/16 1540  BP: 139/70  Pulse: (!) 102  Resp: 18  Temp: 98.4 F (36.9 C)     Body mass index is 34.61 kg/m.    ECOG FS:0 - Asymptomatic  General: Well-developed, well-nourished, no acute distress. Eyes: Pink conjunctiva, anicteric sclera. HEENT: Normocephalic, moist mucous membranes, clear oropharnyx. Lungs: Clear to auscultation bilaterally. Heart: Regular rate and rhythm. No rubs, murmurs, or gallops. Abdomen: Soft, nontender, nondistended. No organomegaly noted, normoactive bowel sounds. Musculoskeletal: No edema, cyanosis, or clubbing. Neuro: Alert, answering all questions appropriately. Cranial nerves grossly  intact. Skin: No rashes or petechiae noted. Psych: Normal affect. Lymphatics: No cervical, calvicular, axillary or inguinal LAD.   LAB RESULTS:  Lab Results  Component Value Date   NA 140 08/27/2016   K 4.8 08/27/2016   CL 108 08/27/2016   CO2 25 08/27/2016   GLUCOSE 125 (H) 08/27/2016   BUN 20 08/27/2016   CREATININE 0.88 08/27/2016   CALCIUM 9.0 08/27/2016   PROT 6.7 08/27/2016   ALBUMIN 4.1 08/27/2016   AST 11 08/27/2016   ALT 9 08/27/2016   ALKPHOS 63 08/27/2016   BILITOT 0.4 08/27/2016   GFRNONAA 57 (L) 06/17/2016   GFRAA >60 06/17/2016    Lab Results  Component Value Date   WBC 5.7 09/14/2016   NEUTROABS 4.2 08/27/2016   HGB 9.0 (L) 09/14/2016   HCT 29.6 (L) 09/14/2016   MCV 64.8 (L) 09/14/2016   PLT 327 09/14/2016   Lab Results  Component Value Date   IRON 19 (L) 09/14/2016   TIBC 502 (H) 09/14/2016   IRONPCTSAT 4 (L) 09/14/2016   Lab Results  Component Value Date   FERRITIN 4 (L) 09/14/2016     STUDIES: No results found.  ASSESSMENT: Iron deficiency anemia.  PLAN:    1. Iron deficiency anemia:Patient's hemoglobin and iron stores are significantly reduced and she is symptomatic. She reports she cannot tolerate oral iron supplementation. The remainder of her laboratory work is either negative or within normal limits. Hemoglobinopathy profile is pending at time of dictation. Patient reports a normal colonoscopy recently. Return to clinic in 1 and 2 weeks to receive 510 mg IV Feraheme. Patient will then return to clinic in 2 months for repeat laboratory work, further evaluation, and consideration of IV iron.  Approximately 40 minutes was spent in discussion of which greater than 50% was consultation.  Patient expressed understanding and was in agreement with this plan. She also understands that She can call clinic at any time with any questions, concerns, or complaints.    Lloyd Huger, MD   09/16/2016 2:21 PM

## 2016-09-15 LAB — ERYTHROPOIETIN: ERYTHROPOIETIN: 45.6 m[IU]/mL — AB (ref 2.6–18.5)

## 2016-09-15 LAB — HAPTOGLOBIN: HAPTOGLOBIN: 135 mg/dL (ref 34–200)

## 2016-09-17 ENCOUNTER — Encounter: Payer: Self-pay | Admitting: Oncology

## 2016-09-22 ENCOUNTER — Ambulatory Visit: Payer: Self-pay

## 2016-09-25 ENCOUNTER — Other Ambulatory Visit: Payer: Self-pay

## 2016-09-29 ENCOUNTER — Inpatient Hospital Stay: Payer: Medicare Other | Attending: Oncology

## 2016-09-29 VITALS — BP 144/85 | HR 86 | Resp 18

## 2016-09-29 DIAGNOSIS — Z79899 Other long term (current) drug therapy: Secondary | ICD-10-CM | POA: Diagnosis not present

## 2016-09-29 DIAGNOSIS — D509 Iron deficiency anemia, unspecified: Secondary | ICD-10-CM | POA: Diagnosis not present

## 2016-09-29 MED ORDER — SODIUM CHLORIDE 0.9 % IV SOLN
510.0000 mg | Freq: Once | INTRAVENOUS | Status: AC
  Administered 2016-09-29: 510 mg via INTRAVENOUS
  Filled 2016-09-29: qty 17

## 2016-09-29 MED ORDER — SODIUM CHLORIDE 0.9 % IV SOLN
Freq: Once | INTRAVENOUS | Status: AC
Start: 2016-09-29 — End: 2016-09-29
  Administered 2016-09-29: 14:00:00 via INTRAVENOUS
  Filled 2016-09-29: qty 1000

## 2016-10-06 ENCOUNTER — Inpatient Hospital Stay: Payer: Medicare Other

## 2016-10-06 VITALS — BP 127/75 | HR 90 | Temp 99.0°F | Resp 18

## 2016-10-06 DIAGNOSIS — D509 Iron deficiency anemia, unspecified: Secondary | ICD-10-CM | POA: Diagnosis not present

## 2016-10-06 DIAGNOSIS — Z79899 Other long term (current) drug therapy: Secondary | ICD-10-CM | POA: Diagnosis not present

## 2016-10-06 MED ORDER — SODIUM CHLORIDE 0.9 % IV SOLN
Freq: Once | INTRAVENOUS | Status: AC
  Administered 2016-10-06: 14:00:00 via INTRAVENOUS
  Filled 2016-10-06: qty 1000

## 2016-10-06 MED ORDER — SODIUM CHLORIDE 0.9 % IV SOLN
510.0000 mg | Freq: Once | INTRAVENOUS | Status: AC
  Administered 2016-10-06: 510 mg via INTRAVENOUS
  Filled 2016-10-06: qty 17

## 2016-10-18 ENCOUNTER — Encounter: Payer: Self-pay | Admitting: Oncology

## 2016-10-26 ENCOUNTER — Ambulatory Visit: Payer: Self-pay | Admitting: Family Medicine

## 2016-10-26 DIAGNOSIS — Z0289 Encounter for other administrative examinations: Secondary | ICD-10-CM

## 2016-11-02 ENCOUNTER — Encounter: Payer: Self-pay | Admitting: Family Medicine

## 2016-11-05 ENCOUNTER — Telehealth: Payer: Self-pay | Admitting: Family Medicine

## 2016-11-05 ENCOUNTER — Ambulatory Visit: Payer: Self-pay | Admitting: Family Medicine

## 2016-11-05 NOTE — Telephone Encounter (Signed)
FYI - Pt called and cancelled appt. Pt states that there was an emergency at work and is unable to leave.

## 2016-11-05 NOTE — Telephone Encounter (Signed)
fyi

## 2016-11-05 NOTE — Telephone Encounter (Signed)
Removed from schedule

## 2016-11-11 NOTE — Progress Notes (Signed)
[place]  [phone] [phone]) [phone]  ID: Debbie Delacruz OB: 1945-12-04  MR#: [id number]  XBW#:[id number]  Patient Care Team: [name], MD as PCP - General (Family Medicine) [name], MD (Internal Medicine) [name], MD as Consulting Physician (Cardiothoracic Surgery) [name], MD as Consulting Physician (Cardiology)  CHIEF COMPLAINT: Iron deficiency anemia.  INTERVAL HISTORY: Patient returns to clinic today for repeat laboratory work and further evaluation. She feels significantly improved after receiving Feraheme recently. She does not complain of weakness or fatigue today. She has no neurologic complaints. She denies any recent fevers or illnesses. She has a good appetite and denies weight loss. She has no chest pain or shortness of breath. She denies any nausea, vomiting, constipation, or diarrhea. She denies any melena or hematochezia. She has no urinary complaints. Patient offers no specific complaints today.  REVIEW OF SYSTEMS:   Review of Systems  Constitutional: Negative for fever, malaise/fatigue and weight loss.  Respiratory: Negative.  Negative for cough and shortness of breath.   Cardiovascular: Negative.  Negative for chest pain and leg swelling.  Gastrointestinal: Negative.  Negative for abdominal pain, blood in stool and melena.  Genitourinary: Negative.   Musculoskeletal: Negative.   Skin: Negative.  Negative for rash.  Neurological: Negative for sensory change and weakness.  Psychiatric/Behavioral: Negative.  The patient is not nervous/anxious.     As per HPI. Otherwise, a complete review of systems is negative.  PAST MEDICAL HISTORY: Past Medical History:  Diagnosis Date  . Anemia   . Anginal pain ([place])   . Anxiety   . Arthritis   . CAD (coronary artery disease)   . Carpal tunnel syndrome    bilateral  . Cataract    bil removed cataracts  . Coronary artery disease    2x  stents, Dr. Clayborn Bigness  . Depression   . Dysrhythmia   . Family history of adverse reaction to anesthesia    "brother; S/P lipotripsy in [place]; transferred to [facility]; had to put him on life support for 2 days"  . GERD (gastroesophageal reflux disease)   . Headache    "weekly" (09/13/2014)  . History of blood transfusion 05/2014   "we haven't figured out why I needed it yet" (09/13/2014)  . History of hiatal hernia   . Hyperglycemia   . Hyperlipidemia   . Hypertension   . Myocardial infarct ([place])   . Myocardial infarct, old 1999   15 years ago  . Obesity   . PONV (postoperative nausea and vomiting)   . S/P CABG x 4 09/18/2014   LIMA to LAD, SVG to PDA-dRCA sequentially, SVG to OM, EVH via right thigh   . Sleep apnea   . Sleep apnea    "don't use a mask" (09/13/2014)  . Unstable angina ([place])     PAST SURGICAL HISTORY: Past Surgical History:  Procedure Laterality Date  . ABDOMINAL HYSTERECTOMY    . APPENDECTOMY    . BACK SURGERY    . BLADDER SURGERY    . CARDIAC CATHETERIZATION  06/2014  . CARDIAC CATHETERIZATION N/A 06/16/2016   Procedure: Left Heart Cath and Cors/Grafts Angiography;  Surgeon: Burnell Blanks, MD;  Location: [place] [facility];  Service: Cardiovascular;  Laterality: N/A;  . CARPAL TUNNEL RELEASE Left   . CATARACT EXTRACTION W/ INTRAOCULAR LENS  IMPLANT, BILATERAL    . CHOLECYSTECTOMY    . COLONOSCOPY    . [place]  [facility] x2 stent  . CORONARY ARTERY BYPASS GRAFT N/A 09/18/2014   Procedure: CORONARY ARTERY BYPASS GRAFTING (CABG)times four using LIMA  to LAD:SVG to  PD and DIST RCA;SVG to OM;, EVH Right thigh;  Surgeon: [name], MD;  Location: [place];  Service: Open Heart Surgery;  Laterality: N/A;  . ESOPHAGOGASTRODUODENOSCOPY N/A 09/17/2014   Procedure: ESOPHAGOGASTRODUODENOSCOPY (EGD);  Surgeon: Inda Castle, MD;  Location: [place];  Service: Endoscopy;  Laterality: N/A;  . heart stents      1999  . INCONTINENCE SURGERY    . INGUINAL HERNIA REPAIR    . INGUINAL HERNIA REPAIR Right X 3   "last one was in the 1990's; still have hernia there now" (09/13/2014)  . LAPAROSCOPIC CHOLECYSTECTOMY    . LUMBAR DISC SURGERY  1990's?  . LUMBAR SPINE SURGERY    . removal of ovaries    . TEE WITHOUT CARDIOVERSION N/A 09/18/2014   Procedure: TRANSESOPHAGEAL ECHOCARDIOGRAM (TEE);  Surgeon: [name], MD;  Location: [place];  Service: Open Heart Surgery;  Laterality: N/A;  . UPPER GASTROINTESTINAL ENDOSCOPY    . VAGINAL HYSTERECTOMY      FAMILY HISTORY: Family History  Problem Relation Age of Onset  . Coronary artery disease Mother   . Osteoporosis Mother   . Heart disease Mother   . Stroke Mother   . Heart attack Mother   . Hypertension Mother   . Hypertension Father   . Coronary artery disease Father   . COPD Father   . Heart disease Father   . Heart attack Father   . Glaucoma Father   . Osteoporosis Father   . Emphysema Father   . Cancer Sister 9       colon  . Hypertension Sister   . Colon cancer Sister        dx in her 46's  . Breast cancer Sister   . Stroke Brother   . Hypertension Brother   . Stroke Maternal Grandfather   . Stroke Maternal Grandmother   . Osteoporosis Maternal Grandmother   . Hypertension Maternal Grandmother   . Hypertension Paternal Grandmother   . Hypertension Paternal Grandfather   . Breast cancer Maternal Aunt        2 aunts-50's  . Esophageal cancer Neg Hx   . Stomach cancer Neg Hx   . Rectal cancer Neg Hx   . Pancreatic cancer Neg Hx     ADVANCED DIRECTIVES (Y/N):  N  HEALTH MAINTENANCE: Social History  Substance Use Topics  . Smoking status: Never Smoker  . Smokeless tobacco: Never Used  . Alcohol use 0.0 oz/week     Comment: 09/13/2014 "might have a drink a couple times/yr"     Colonoscopy:  PAP:  Bone density:  Lipid panel:  Allergies  Allergen Reactions  . Ferrous Sulfate [Ferrous Fumarate] Other (See Comments)     STOMACH ISSUES  . Lipitor [Atorvastatin] Other (See Comments)    myalgia    Current Outpatient Prescriptions  Medication Sig Dispense Refill  . acetaminophen (TYLENOL) 500 MG tablet Take 500 mg by mouth every 6 (six) hours as needed for headache.    . albuterol (PROVENTIL HFA;VENTOLIN HFA) 108 (90 Base) MCG/ACT inhaler Inhale 2 puffs into the lungs every 6 (six) hours as needed for wheezing or shortness of breath. 1 Inhaler 2  . aspirin EC 81 MG tablet Take 1 tablet (81 mg total) by mouth daily.    . diazepam (VALIUM) 5 MG tablet Take one tablet (  5 mg) on arrival for your MRI 1 tablet 0  . metoprolol succinate (TOPROL-XL) 50 MG 24 hr tablet Take 1 tablet (50 mg total) by mouth daily. Take with or immediately following a meal. 30 tablet 3  . pantoprazole (PROTONIX) 40 MG tablet Take 1 tablet (40 mg total) by mouth 2 (two) times daily. 60 tablet 2  . rosuvastatin (CRESTOR) 10 MG tablet Take 1 tablet (10 mg total) by mouth daily. 30 tablet 2  . sertraline (ZOLOFT) 100 MG tablet Take 1 tablet (100 mg total) by mouth daily. 90 tablet 1  . lisinopril (PRINIVIL,ZESTRIL) 5 MG tablet Take 1 tablet (5 mg total) by mouth daily. 90 tablet 3   No current facility-administered medications for this visit.     OBJECTIVE: Vitals:   11/12/16 1358  BP: (!) 130/98  Pulse: 93  Resp: 20  Temp: 98.2 F (36.8 C)     Body mass index is 33.89 kg/m.    ECOG FS:0 - Asymptomatic  General: Well-developed, well-nourished, no acute distress. Eyes: Pink conjunctiva, anicteric sclera. Lungs: Clear to auscultation bilaterally. Heart: Regular rate and rhythm. No rubs, murmurs, or gallops. Abdomen: Soft, nontender, nondistended. No organomegaly noted, normoactive bowel sounds. Musculoskeletal: No edema, cyanosis, or clubbing. Neuro: Alert, answering all questions appropriately. Cranial nerves grossly intact. Skin: No rashes or petechiae noted. Psych: Normal affect.   LAB RESULTS:  Lab Results  Component  Value Date   NA 140 08/27/2016   K 4.8 08/27/2016   CL 108 08/27/2016   CO2 25 08/27/2016   GLUCOSE 125 (H) 08/27/2016   BUN 20 08/27/2016   CREATININE 0.88 08/27/2016   CALCIUM 9.0 08/27/2016   PROT 6.7 08/27/2016   ALBUMIN 4.1 08/27/2016   AST 11 08/27/2016   ALT 9 08/27/2016   ALKPHOS 63 08/27/2016   BILITOT 0.4 08/27/2016   GFRNONAA 57 (L) 06/17/2016   GFRAA >60 06/17/2016    Lab Results  Component Value Date   WBC 5.5 11/12/2016   NEUTROABS 3.5 11/12/2016   HGB 13.7 11/12/2016   HCT 41.1 11/12/2016   MCV 80.4 11/12/2016   PLT 247 11/12/2016   Lab Results  Component Value Date   IRON 36 11/12/2016   TIBC 407 11/12/2016   IRONPCTSAT 9 (L) 11/12/2016   Lab Results  Component Value Date   FERRITIN 22 11/12/2016     STUDIES: No results found.  ASSESSMENT: Iron deficiency anemia.  PLAN:    1. Iron deficiency anemia: Patient's hemoglobin has significantly improved and is now within normal limits. Patient's iron stores have also improved, but continue to be decreased. Patient is now asymptomatic. Previously she reported she cannot tolerate oral iron supplementation. The remainder of her laboratory work is either negative or within normal limits.  Patient had a normal endoscopy in May 2017 and a normal colonoscopy in August 2014. She does not require additional Feraheme today, but given her decreased iron stores will likely require additional infusions in the future. Return to clinic in 4 months for repeat laboratory work, further evaluation, and consideration of IV iron.  Approximately 20 minutes was spent in discussion of which greater than 50% was consultation.  Patient expressed understanding and was in agreement with this plan. She also understands that She can call clinic at any time with any questions, concerns, or complaints.    Lloyd Huger, MD   11/15/2016 9:19 AM

## 2016-11-12 ENCOUNTER — Inpatient Hospital Stay (HOSPITAL_BASED_OUTPATIENT_CLINIC_OR_DEPARTMENT_OTHER): Payer: Medicare Other | Admitting: Oncology

## 2016-11-12 ENCOUNTER — Inpatient Hospital Stay: Payer: Medicare Other

## 2016-11-12 ENCOUNTER — Inpatient Hospital Stay: Payer: Medicare Other | Attending: Oncology

## 2016-11-12 VITALS — BP 130/98 | HR 93 | Temp 98.2°F | Resp 20 | Wt 188.3 lb

## 2016-11-12 DIAGNOSIS — Z79899 Other long term (current) drug therapy: Secondary | ICD-10-CM | POA: Insufficient documentation

## 2016-11-12 DIAGNOSIS — D509 Iron deficiency anemia, unspecified: Secondary | ICD-10-CM

## 2016-11-12 DIAGNOSIS — E669 Obesity, unspecified: Secondary | ICD-10-CM | POA: Diagnosis not present

## 2016-11-12 DIAGNOSIS — I2510 Atherosclerotic heart disease of native coronary artery without angina pectoris: Secondary | ICD-10-CM | POA: Insufficient documentation

## 2016-11-12 DIAGNOSIS — F419 Anxiety disorder, unspecified: Secondary | ICD-10-CM

## 2016-11-12 DIAGNOSIS — Z803 Family history of malignant neoplasm of breast: Secondary | ICD-10-CM | POA: Diagnosis not present

## 2016-11-12 DIAGNOSIS — E785 Hyperlipidemia, unspecified: Secondary | ICD-10-CM

## 2016-11-12 DIAGNOSIS — K219 Gastro-esophageal reflux disease without esophagitis: Secondary | ICD-10-CM | POA: Insufficient documentation

## 2016-11-12 DIAGNOSIS — F329 Major depressive disorder, single episode, unspecified: Secondary | ICD-10-CM | POA: Insufficient documentation

## 2016-11-12 DIAGNOSIS — K449 Diaphragmatic hernia without obstruction or gangrene: Secondary | ICD-10-CM

## 2016-11-12 DIAGNOSIS — Z800 Family history of malignant neoplasm of digestive organs: Secondary | ICD-10-CM | POA: Insufficient documentation

## 2016-11-12 DIAGNOSIS — R079 Chest pain, unspecified: Secondary | ICD-10-CM | POA: Insufficient documentation

## 2016-11-12 DIAGNOSIS — I10 Essential (primary) hypertension: Secondary | ICD-10-CM | POA: Insufficient documentation

## 2016-11-12 DIAGNOSIS — Z7982 Long term (current) use of aspirin: Secondary | ICD-10-CM | POA: Insufficient documentation

## 2016-11-12 DIAGNOSIS — I252 Old myocardial infarction: Secondary | ICD-10-CM | POA: Diagnosis not present

## 2016-11-12 DIAGNOSIS — M129 Arthropathy, unspecified: Secondary | ICD-10-CM | POA: Insufficient documentation

## 2016-11-12 DIAGNOSIS — Z808 Family history of malignant neoplasm of other organs or systems: Secondary | ICD-10-CM

## 2016-11-12 LAB — CBC WITH DIFFERENTIAL/PLATELET
BASOS PCT: 1 %
Basophils Absolute: 0 10*3/uL (ref 0–0.1)
Eosinophils Absolute: 0.2 10*3/uL (ref 0–0.7)
Eosinophils Relative: 4 %
HEMATOCRIT: 41.1 % (ref 35.0–47.0)
Hemoglobin: 13.7 g/dL (ref 12.0–16.0)
LYMPHS ABS: 1.2 10*3/uL (ref 1.0–3.6)
LYMPHS PCT: 22 %
MCH: 26.8 pg (ref 26.0–34.0)
MCHC: 33.4 g/dL (ref 32.0–36.0)
MCV: 80.4 fL (ref 80.0–100.0)
MONO ABS: 0.6 10*3/uL (ref 0.2–0.9)
MONOS PCT: 10 %
NEUTROS ABS: 3.5 10*3/uL (ref 1.4–6.5)
Neutrophils Relative %: 63 %
Platelets: 247 10*3/uL (ref 150–440)
RBC: 5.12 MIL/uL (ref 3.80–5.20)
RDW: 31.3 % — AB (ref 11.5–14.5)
WBC: 5.5 10*3/uL (ref 3.6–11.0)

## 2016-11-12 LAB — IRON AND TIBC
Iron: 36 ug/dL (ref 28–170)
Saturation Ratios: 9 % — ABNORMAL LOW (ref 10.4–31.8)
TIBC: 407 ug/dL (ref 250–450)
UIBC: 371 ug/dL

## 2016-11-12 LAB — FERRITIN: Ferritin: 22 ng/mL (ref 11–307)

## 2016-11-12 NOTE — Progress Notes (Signed)
Patient denies any concerns today.  

## 2016-12-22 ENCOUNTER — Ambulatory Visit: Payer: Self-pay | Admitting: Family Medicine

## 2017-01-15 ENCOUNTER — Encounter: Payer: Self-pay | Admitting: Family Medicine

## 2017-01-15 NOTE — Telephone Encounter (Signed)
error 

## 2017-02-10 ENCOUNTER — Ambulatory Visit: Payer: Self-pay | Admitting: Family Medicine

## 2017-02-10 DIAGNOSIS — Z0289 Encounter for other administrative examinations: Secondary | ICD-10-CM

## 2017-03-05 ENCOUNTER — Other Ambulatory Visit: Payer: Self-pay | Admitting: Family Medicine

## 2017-03-05 DIAGNOSIS — Z1231 Encounter for screening mammogram for malignant neoplasm of breast: Secondary | ICD-10-CM

## 2017-03-08 DIAGNOSIS — R61 Generalized hyperhidrosis: Secondary | ICD-10-CM | POA: Diagnosis not present

## 2017-03-08 DIAGNOSIS — A319 Mycobacterial infection, unspecified: Secondary | ICD-10-CM | POA: Diagnosis not present

## 2017-03-09 ENCOUNTER — Encounter: Payer: Self-pay | Admitting: Family Medicine

## 2017-03-09 ENCOUNTER — Ambulatory Visit: Payer: Self-pay

## 2017-03-09 DIAGNOSIS — Z0289 Encounter for other administrative examinations: Secondary | ICD-10-CM

## 2017-03-18 ENCOUNTER — Inpatient Hospital Stay: Payer: Medicare Other

## 2017-03-18 ENCOUNTER — Inpatient Hospital Stay: Payer: Medicare Other | Admitting: Oncology

## 2017-03-23 ENCOUNTER — Other Ambulatory Visit: Payer: Self-pay

## 2017-03-23 DIAGNOSIS — E785 Hyperlipidemia, unspecified: Secondary | ICD-10-CM

## 2017-03-23 NOTE — Telephone Encounter (Signed)
Please see if she has been taking this daily. She needs repeat cholesterol panel as well. Thanks.

## 2017-03-23 NOTE — Telephone Encounter (Signed)
Last OV 08/27/16 last filled 06/30/16 30 2rf

## 2017-03-24 NOTE — Progress Notes (Deleted)
[place]  Telephone:(336) [phone] Fax:(336) [phone]  ID: [name] OB: 02/04/1946  MR#: [id number]  BOF#:[id number]  Patient Care Team: [name], MD as PCP - General (Family Medicine) [name], MD (Internal Medicine) [name], MD as Consulting Physician (Cardiothoracic Surgery) [name], MD as Consulting Physician (Cardiology)  CHIEF COMPLAINT: Iron deficiency anemia.  INTERVAL HISTORY: Patient returns to clinic today for repeat laboratory work and further evaluation. She feels significantly improved after receiving Feraheme recently. She does not complain of weakness or fatigue today. She has no neurologic complaints. She denies any recent fevers or illnesses. She has a good appetite and denies weight loss. She has no chest pain or shortness of breath. She denies any nausea, vomiting, constipation, or diarrhea. She denies any melena or hematochezia. She has no urinary complaints. Patient offers no specific complaints today.  REVIEW OF SYSTEMS:   Review of Systems  Constitutional: Negative for fever, malaise/fatigue and weight loss.  Respiratory: Negative.  Negative for cough and shortness of breath.   Cardiovascular: Negative.  Negative for chest pain and leg swelling.  Gastrointestinal: Negative.  Negative for abdominal pain, blood in stool and melena.  Genitourinary: Negative.   Musculoskeletal: Negative.   Skin: Negative.  Negative for rash.  Neurological: Negative for sensory change and weakness.  Psychiatric/Behavioral: Negative.  The patient is not nervous/anxious.     As per HPI. Otherwise, a complete review of systems is negative.  PAST MEDICAL HISTORY: Past Medical History:  Diagnosis Date  . Anemia   . Anginal pain ([place])   . Anxiety   . Arthritis   . CAD (coronary artery disease)   . Carpal tunnel syndrome    bilateral  . Cataract    bil removed cataracts  . Coronary artery disease    2x  stents, Dr. Clayborn Bigness  . Depression   . Dysrhythmia   . Family history of adverse reaction to anesthesia    "brother; S/P lipotripsy in [place]; transferred to [facility]; had to put him on life support for 2 days"  . GERD (gastroesophageal reflux disease)   . Headache    "weekly" (09/13/2014)  . History of blood transfusion 05/2014   "we haven't figured out why I needed it yet" (09/13/2014)  . History of hiatal hernia   . Hyperglycemia   . Hyperlipidemia   . Hypertension   . Myocardial infarct ([place])   . Myocardial infarct, old 1999   15 years ago  . Obesity   . PONV (postoperative nausea and vomiting)   . S/P CABG x 4 09/18/2014   LIMA to LAD, SVG to PDA-dRCA sequentially, SVG to OM, EVH via right thigh   . Sleep apnea   . Sleep apnea    "don't use a mask" (09/13/2014)  . Unstable angina ([place])     PAST SURGICAL HISTORY: Past Surgical History:  Procedure Laterality Date  . ABDOMINAL HYSTERECTOMY    . APPENDECTOMY    . BACK SURGERY    . BLADDER SURGERY    . CARDIAC CATHETERIZATION  06/2014  . CARDIAC CATHETERIZATION N/A 06/16/2016   Procedure: Left Heart Cath and Cors/Grafts Angiography;  Surgeon: Burnell Blanks, MD;  Location: [place] [facility];  Service: Cardiovascular;  Laterality: N/A;  . CARPAL TUNNEL RELEASE Left   . CATARACT EXTRACTION W/ INTRAOCULAR LENS  IMPLANT, BILATERAL    . CHOLECYSTECTOMY    . COLONOSCOPY    . [place]  [facility] x2 stent  . CORONARY ARTERY BYPASS GRAFT N/A 09/18/2014   Procedure: CORONARY ARTERY BYPASS GRAFTING (CABG)times four using LIMA  to LAD:SVG to  PD and DIST RCA;SVG to OM;, EVH Right thigh;  Surgeon: [name], MD;  Location: [place];  Service: Open Heart Surgery;  Laterality: N/A;  . ESOPHAGOGASTRODUODENOSCOPY N/A 09/17/2014   Procedure: ESOPHAGOGASTRODUODENOSCOPY (EGD);  Surgeon: Inda Castle, MD;  Location: [place];  Service: Endoscopy;  Laterality: N/A;  . heart stents      1999  . INCONTINENCE SURGERY    . INGUINAL HERNIA REPAIR    . INGUINAL HERNIA REPAIR Right X 3   "last one was in the 1990's; still have hernia there now" (09/13/2014)  . LAPAROSCOPIC CHOLECYSTECTOMY    . LUMBAR DISC SURGERY  1990's?  . LUMBAR SPINE SURGERY    . removal of ovaries    . TEE WITHOUT CARDIOVERSION N/A 09/18/2014   Procedure: TRANSESOPHAGEAL ECHOCARDIOGRAM (TEE);  Surgeon: [name], MD;  Location: [place];  Service: Open Heart Surgery;  Laterality: N/A;  . UPPER GASTROINTESTINAL ENDOSCOPY    . VAGINAL HYSTERECTOMY      FAMILY HISTORY: Family History  Problem Relation Age of Onset  . Coronary artery disease Mother   . Osteoporosis Mother   . Heart disease Mother   . Stroke Mother   . Heart attack Mother   . Hypertension Mother   . Hypertension Father   . Coronary artery disease Father   . COPD Father   . Heart disease Father   . Heart attack Father   . Glaucoma Father   . Osteoporosis Father   . Emphysema Father   . Cancer Sister 9       colon  . Hypertension Sister   . Colon cancer Sister        dx in her 46's  . Breast cancer Sister   . Stroke Brother   . Hypertension Brother   . Stroke Maternal Grandfather   . Stroke Maternal Grandmother   . Osteoporosis Maternal Grandmother   . Hypertension Maternal Grandmother   . Hypertension Paternal Grandmother   . Hypertension Paternal Grandfather   . Breast cancer Maternal Aunt        2 aunts-50's  . Esophageal cancer Neg Hx   . Stomach cancer Neg Hx   . Rectal cancer Neg Hx   . Pancreatic cancer Neg Hx     ADVANCED DIRECTIVES (Y/N):  N  HEALTH MAINTENANCE: Social History  Substance Use Topics  . Smoking status: Never Smoker  . Smokeless tobacco: Never Used  . Alcohol use 0.0 oz/week     Comment: 09/13/2014 "might have a drink a couple times/yr"     Colonoscopy:  PAP:  Bone density:  Lipid panel:  Allergies  Allergen Reactions  . Ferrous Sulfate [Ferrous Fumarate] Other (See Comments)     STOMACH ISSUES  . Lipitor [Atorvastatin] Other (See Comments)    myalgia    Current Outpatient Prescriptions  Medication Sig Dispense Refill  . acetaminophen (TYLENOL) 500 MG tablet Take 500 mg by mouth every 6 (six) hours as needed for headache.    . albuterol (PROVENTIL HFA;VENTOLIN HFA) 108 (90 Base) MCG/ACT inhaler Inhale 2 puffs into the lungs every 6 (six) hours as needed for wheezing or shortness of breath. 1 Inhaler 2  . aspirin EC 81 MG tablet Take 1 tablet (81 mg total) by mouth daily.    . diazepam (VALIUM) 5 MG tablet Take one tablet (  5 mg) on arrival for your MRI 1 tablet 0  . lisinopril (PRINIVIL,ZESTRIL) 5 MG tablet Take 1 tablet (5 mg total) by mouth daily. 90 tablet 3  . metoprolol succinate (TOPROL-XL) 50 MG 24 hr tablet Take 1 tablet (50 mg total) by mouth daily. Take with or immediately following a meal. 30 tablet 3  . pantoprazole (PROTONIX) 40 MG tablet Take 1 tablet (40 mg total) by mouth 2 (two) times daily. 60 tablet 2  . rosuvastatin (CRESTOR) 10 MG tablet Take 1 tablet (10 mg total) by mouth daily. 30 tablet 2  . sertraline (ZOLOFT) 100 MG tablet Take 1 tablet (100 mg total) by mouth daily. 90 tablet 1   No current facility-administered medications for this visit.     OBJECTIVE: There were no vitals filed for this visit.   There is no height or weight on file to calculate BMI.    ECOG FS:0 - Asymptomatic  General: Well-developed, well-nourished, no acute distress. Eyes: Pink conjunctiva, anicteric sclera. Lungs: Clear to auscultation bilaterally. Heart: Regular rate and rhythm. No rubs, murmurs, or gallops. Abdomen: Soft, nontender, nondistended. No organomegaly noted, normoactive bowel sounds. Musculoskeletal: No edema, cyanosis, or clubbing. Neuro: Alert, answering all questions appropriately. Cranial nerves grossly intact. Skin: No rashes or petechiae noted. Psych: Normal affect.   LAB RESULTS:  Lab Results  Component Value Date   NA 140 08/27/2016    K 4.8 08/27/2016   CL 108 08/27/2016   CO2 25 08/27/2016   GLUCOSE 125 (H) 08/27/2016   BUN 20 08/27/2016   CREATININE 0.88 08/27/2016   CALCIUM 9.0 08/27/2016   PROT 6.7 08/27/2016   ALBUMIN 4.1 08/27/2016   AST 11 08/27/2016   ALT 9 08/27/2016   ALKPHOS 63 08/27/2016   BILITOT 0.4 08/27/2016   GFRNONAA 57 (L) 06/17/2016   GFRAA >60 06/17/2016    Lab Results  Component Value Date   WBC 5.5 11/12/2016   NEUTROABS 3.5 11/12/2016   HGB 13.7 11/12/2016   HCT 41.1 11/12/2016   MCV 80.4 11/12/2016   PLT 247 11/12/2016   Lab Results  Component Value Date   IRON 36 11/12/2016   TIBC 407 11/12/2016   IRONPCTSAT 9 (L) 11/12/2016   Lab Results  Component Value Date   FERRITIN 22 11/12/2016     STUDIES: No results found.  ASSESSMENT: Iron deficiency anemia.  PLAN:    1. Iron deficiency anemia: Patient's hemoglobin has significantly improved and is now within normal limits. Patient's iron stores have also improved, but continue to be decreased. Patient is now asymptomatic. Previously she reported she cannot tolerate oral iron supplementation. The remainder of her laboratory work is either negative or within normal limits.  Patient had a normal endoscopy in May 2017 and a normal colonoscopy in August 2014. She does not require additional Feraheme today, but given her decreased iron stores will likely require additional infusions in the future. Return to clinic in 4 months for repeat laboratory work, further evaluation, and consideration of IV iron.  Approximately 20 minutes was spent in discussion of which greater than 50% was consultation.  Patient expressed understanding and was in agreement with this plan. She also understands that She can call clinic at any time with any questions, concerns, or complaints.    Lloyd Huger, MD   03/24/2017 12:48 PM

## 2017-03-29 ENCOUNTER — Inpatient Hospital Stay: Payer: Medicare Other | Admitting: Oncology

## 2017-03-29 ENCOUNTER — Inpatient Hospital Stay: Payer: Medicare Other

## 2017-04-05 NOTE — Telephone Encounter (Signed)
Left message to return call 

## 2017-04-06 ENCOUNTER — Telehealth: Payer: Self-pay | Admitting: Family Medicine

## 2017-04-06 MED ORDER — ROSUVASTATIN CALCIUM 10 MG PO TABS: 10.0000 mg | ORAL_TABLET | Freq: Every day | ORAL | 2 refills | Status: DC

## 2017-04-06 NOTE — Telephone Encounter (Signed)
Debbie Delacruz- you may order lipid panel under hyperlipidemia E78.5

## 2017-04-06 NOTE — Telephone Encounter (Signed)
Patient states she is taking crestor everyday. Patient is scheduled for labs please place order.

## 2017-04-06 NOTE — Telephone Encounter (Signed)
Please advise 

## 2017-04-06 NOTE — Telephone Encounter (Signed)
Copied from [place] [id number]. Topic: General - Other >> Apr 06, 2017 10:28 AM Yvette Rack wrote: Reason for CRM: patient states that Janett Billow called her

## 2017-04-08 NOTE — Addendum Note (Signed)
Addended by: Juanda Chance on: 04/08/2017 11:25 AM   Modules accepted: Orders

## 2017-04-08 NOTE — Telephone Encounter (Signed)
Placed lab order  

## 2017-04-25 NOTE — Progress Notes (Signed)
[place]  Telephone:(336) [phone] Fax:(336) [phone]  ID: [name] OB: 07/28/1945  MR#: [id number]  RWE#:[id number]  Patient Care Team: [name], MD as PCP - General (Family Medicine) [name], MD (Internal Medicine) [name], MD as Consulting Physician (Cardiothoracic Surgery) [name], MD as Consulting Physician (Cardiology)  CHIEF COMPLAINT: Iron deficiency anemia.  INTERVAL HISTORY: Patient returns to clinic today for repeat laboratory work and further evaluation. She currently feels well and is asymptomatic. She does not complain of weakness or fatigue today. She has no neurologic complaints. She denies any recent fevers or illnesses. She has a good appetite and denies weight loss. She has no chest pain or shortness of breath. She denies any nausea, vomiting, constipation, or diarrhea. She denies any melena or hematochezia. She has no urinary complaints. Patient offers no specific complaints today.  REVIEW OF SYSTEMS:   Review of Systems  Constitutional: Negative for fever, malaise/fatigue and weight loss.  Respiratory: Negative.  Negative for cough and shortness of breath.   Cardiovascular: Negative.  Negative for chest pain and leg swelling.  Gastrointestinal: Negative.  Negative for abdominal pain, blood in stool and melena.  Genitourinary: Negative.   Musculoskeletal: Negative.   Skin: Negative.  Negative for rash.  Neurological: Negative for sensory change and weakness.  Psychiatric/Behavioral: Negative.  The patient is not nervous/anxious.     As per HPI. Otherwise, a complete review of systems is negative.  PAST MEDICAL HISTORY: Past Medical History:  Diagnosis Date  . Anemia   . Anginal pain ([place])   . Anxiety   . Arthritis   . CAD (coronary artery disease)   . Carpal tunnel syndrome    bilateral  . Cataract    bil removed cataracts  . Coronary artery disease    2x stents, Dr. Clayborn Bigness  .  Depression   . Dysrhythmia   . Family history of adverse reaction to anesthesia    "brother; S/P lipotripsy in [place]; transferred to [facility]; had to put him on life support for 2 days"  . GERD (gastroesophageal reflux disease)   . Headache    "weekly" (09/13/2014)  . History of blood transfusion 05/2014   "we haven't figured out why I needed it yet" (09/13/2014)  . History of hiatal hernia   . Hyperglycemia   . Hyperlipidemia   . Hypertension   . Myocardial infarct ([place])   . Myocardial infarct, old 1999   15 years ago  . Obesity   . PONV (postoperative nausea and vomiting)   . S/P CABG x 4 09/18/2014   LIMA to LAD, SVG to PDA-dRCA sequentially, SVG to OM, EVH via right thigh   . Sleep apnea   . Sleep apnea    "don't use a mask" (09/13/2014)  . Unstable angina ([place])     PAST SURGICAL HISTORY: Past Surgical History:  Procedure Laterality Date  . ABDOMINAL HYSTERECTOMY    . APPENDECTOMY    . BACK SURGERY    . BLADDER SURGERY    . CARDIAC CATHETERIZATION  06/2014  . CARDIAC CATHETERIZATION N/A 06/16/2016   Procedure: Left Heart Cath and Cors/Grafts Angiography;  Surgeon: Burnell Blanks, MD;  Location: [place] [facility];  Service: Cardiovascular;  Laterality: N/A;  . CARPAL TUNNEL RELEASE Left   . CATARACT EXTRACTION W/ INTRAOCULAR LENS  IMPLANT, BILATERAL    . CHOLECYSTECTOMY    . COLONOSCOPY    . [place]  [facility] x2 stent  . CORONARY ARTERY BYPASS GRAFT N/A 09/18/2014   Procedure: CORONARY ARTERY BYPASS GRAFTING (CABG)times four using LIMA  to LAD:SVG to  PD and DIST RCA;SVG to OM;, EVH Right thigh;  Surgeon: [name], MD;  Location: [place];  Service: Open Heart Surgery;  Laterality: N/A;  . ESOPHAGOGASTRODUODENOSCOPY N/A 09/17/2014   Procedure: ESOPHAGOGASTRODUODENOSCOPY (EGD);  Surgeon: Inda Castle, MD;  Location: [place];  Service: Endoscopy;  Laterality: N/A;  . heart stents     1999  . INCONTINENCE  SURGERY    . INGUINAL HERNIA REPAIR    . INGUINAL HERNIA REPAIR Right X 3   "last one was in the 1990's; still have hernia there now" (09/13/2014)  . LAPAROSCOPIC CHOLECYSTECTOMY    . LUMBAR DISC SURGERY  1990's?  . LUMBAR SPINE SURGERY    . removal of ovaries    . TEE WITHOUT CARDIOVERSION N/A 09/18/2014   Procedure: TRANSESOPHAGEAL ECHOCARDIOGRAM (TEE);  Surgeon: [name], MD;  Location: [place];  Service: Open Heart Surgery;  Laterality: N/A;  . UPPER GASTROINTESTINAL ENDOSCOPY    . VAGINAL HYSTERECTOMY      FAMILY HISTORY: Family History  Problem Relation Age of Onset  . Coronary artery disease Mother   . Osteoporosis Mother   . Heart disease Mother   . Stroke Mother   . Heart attack Mother   . Hypertension Mother   . Hypertension Father   . Coronary artery disease Father   . COPD Father   . Heart disease Father   . Heart attack Father   . Glaucoma Father   . Osteoporosis Father   . Emphysema Father   . Cancer Sister 63       colon  . Hypertension Sister   . Colon cancer Sister        dx in her 35's  . Breast cancer Sister   . Stroke Brother   . Hypertension Brother   . Stroke Maternal Grandfather   . Stroke Maternal Grandmother   . Osteoporosis Maternal Grandmother   . Hypertension Maternal Grandmother   . Hypertension Paternal Grandmother   . Hypertension Paternal Grandfather   . Breast cancer Maternal Aunt        2 aunts-50's  . Esophageal cancer Neg Hx   . Stomach cancer Neg Hx   . Rectal cancer Neg Hx   . Pancreatic cancer Neg Hx     ADVANCED DIRECTIVES (Y/N):  N  HEALTH MAINTENANCE: Social History   Tobacco Use  . Smoking status: Never Smoker  . Smokeless tobacco: Never Used  Substance Use Topics  . Alcohol use: Yes    Alcohol/week: 0.0 oz    Comment: 09/13/2014 "might have a drink a couple times/yr"  . Drug use: No     Colonoscopy:  PAP:  Bone density:  Lipid panel:  Allergies  Allergen Reactions  . Ferrous Sulfate [Ferrous  Fumarate] Other (See Comments)    STOMACH ISSUES  . Lipitor [Atorvastatin] Other (See Comments)    myalgia    Current Outpatient Medications  Medication Sig Dispense Refill  . acetaminophen (TYLENOL) 500 MG tablet Take 500 mg by mouth every 6 (six) hours as needed for headache.    [place] Kitchen aspirin EC 81 MG tablet Take 1 tablet (81 mg total) by mouth daily.    . diazepam (VALIUM) 5 MG tablet Take one tablet (5 mg) on arrival for your MRI 1 tablet 0  . lisinopril (PRINIVIL,ZESTRIL) 5 MG tablet Take 1  tablet (5 mg total) by mouth daily. 90 tablet 3  . metoprolol succinate (TOPROL-XL) 50 MG 24 hr tablet Take 1 tablet (50 mg total) by mouth daily. Take with or immediately following a meal. 30 tablet 3  . pantoprazole (PROTONIX) 40 MG tablet Take 1 tablet (40 mg total) by mouth 2 (two) times daily. 60 tablet 2  . rosuvastatin (CRESTOR) 10 MG tablet Take 1 tablet (10 mg total) daily by mouth. 30 tablet 2  . sertraline (ZOLOFT) 100 MG tablet Take 1 tablet (100 mg total) by mouth daily. 90 tablet 1  . albuterol (PROVENTIL HFA;VENTOLIN HFA) 108 (90 Base) MCG/ACT inhaler Inhale 2 puffs into the lungs every 6 (six) hours as needed for wheezing or shortness of breath. (Patient not taking: Reported on 04/28/2017) 1 Inhaler 2   No current facility-administered medications for this visit.     OBJECTIVE: Vitals:   04/28/17 0848  BP: (!) 158/84  Pulse: 84  Resp: 18  Temp: 97.6 F (36.4 C)     Body mass index is 36.11 kg/m.    ECOG FS:0 - Asymptomatic  General: Well-developed, well-nourished, no acute distress. Eyes: Pink conjunctiva, anicteric sclera. Lungs: Clear to auscultation bilaterally. Heart: Regular rate and rhythm. No rubs, murmurs, or gallops. Abdomen: Soft, nontender, nondistended. No organomegaly noted, normoactive bowel sounds. Musculoskeletal: No edema, cyanosis, or clubbing. Neuro: Alert, answering all questions appropriately. Cranial nerves grossly intact. Skin: No rashes or petechiae  noted. Psych: Normal affect.   LAB RESULTS:  Lab Results  Component Value Date   NA 140 08/27/2016   K 4.8 08/27/2016   CL 108 08/27/2016   CO2 25 08/27/2016   GLUCOSE 125 (H) 08/27/2016   BUN 20 08/27/2016   CREATININE 0.88 08/27/2016   CALCIUM 9.0 08/27/2016   PROT 6.7 08/27/2016   ALBUMIN 4.1 08/27/2016   AST 11 08/27/2016   ALT 9 08/27/2016   ALKPHOS 63 08/27/2016   BILITOT 0.4 08/27/2016   GFRNONAA 57 (L) 06/17/2016   GFRAA >60 06/17/2016    Lab Results  Component Value Date   WBC 5.6 04/28/2017   NEUTROABS 3.4 04/28/2017   HGB 11.9 (L) 04/28/2017   HCT 37.6 04/28/2017   MCV 82.1 04/28/2017   PLT 237 04/28/2017   Lab Results  Component Value Date   IRON 30 04/28/2017   TIBC 490 (H) 04/28/2017   IRONPCTSAT 6 (L) 04/28/2017   Lab Results  Component Value Date   FERRITIN 7 (L) 04/28/2017     STUDIES: No results found.  ASSESSMENT: Iron deficiency anemia.  PLAN:    1. Iron deficiency anemia: Patient's hemoglobin has trended down and she continues to have decreased iron stores. Previously she reported she cannot tolerate oral iron supplementation. The remainder of her laboratory work is either negative or within normal limits.  Patient had a normal endoscopy in May 2017 and a normal colonoscopy in August 2014.  Proceed with 510 mg IV Feraheme today.  Return to clinic in 1 week for a second infusion.  Patient will then return to clinic in 4 months for repeat laboratory work, further evaluation, and consideration of IV iron.  Approximately 30 minutes was spent in discussion of which greater than 50% was consultation.  Patient expressed understanding and was in agreement with this plan. She also understands that She can call clinic at any time with any questions, concerns, or complaints.    Lloyd Huger, MD   04/28/2017 2:03 PM

## 2017-04-26 ENCOUNTER — Other Ambulatory Visit: Payer: Self-pay

## 2017-04-27 ENCOUNTER — Telehealth: Payer: Self-pay | Admitting: *Deleted

## 2017-04-27 ENCOUNTER — Other Ambulatory Visit: Payer: Self-pay | Admitting: *Deleted

## 2017-04-27 DIAGNOSIS — D649 Anemia, unspecified: Secondary | ICD-10-CM

## 2017-04-27 NOTE — Telephone Encounter (Signed)
Copied from [place] [id number]. Topic: Quick Communication - See Telephone Encounter >> Apr 27, 2017 11:55 AM Boyd Kerbs wrote: CRM for notification. See Telephone encounter for:  Patient scheduled Lab work for the 6th per Dr. Caryl Bis.  She is having labs drawn on the 5th, tomorrow at the cancer center. Is it possible to have the cancer center lab do all the labs? Please call her and let her know 04/27/17.

## 2017-04-27 NOTE — Telephone Encounter (Signed)
Patient will have cholesterol lab drawn at cancer center, notified to fast

## 2017-04-28 ENCOUNTER — Inpatient Hospital Stay (HOSPITAL_BASED_OUTPATIENT_CLINIC_OR_DEPARTMENT_OTHER): Payer: Medicare Other | Admitting: Oncology

## 2017-04-28 ENCOUNTER — Inpatient Hospital Stay: Payer: Medicare Other

## 2017-04-28 ENCOUNTER — Inpatient Hospital Stay: Payer: Medicare Other | Attending: Oncology

## 2017-04-28 VITALS — BP 158/84 | HR 84 | Temp 97.6°F | Resp 18 | Wt 200.6 lb

## 2017-04-28 DIAGNOSIS — F329 Major depressive disorder, single episode, unspecified: Secondary | ICD-10-CM | POA: Diagnosis not present

## 2017-04-28 DIAGNOSIS — I10 Essential (primary) hypertension: Secondary | ICD-10-CM | POA: Diagnosis not present

## 2017-04-28 DIAGNOSIS — I209 Angina pectoris, unspecified: Secondary | ICD-10-CM | POA: Insufficient documentation

## 2017-04-28 DIAGNOSIS — D509 Iron deficiency anemia, unspecified: Secondary | ICD-10-CM | POA: Insufficient documentation

## 2017-04-28 DIAGNOSIS — Z79899 Other long term (current) drug therapy: Secondary | ICD-10-CM | POA: Diagnosis not present

## 2017-04-28 DIAGNOSIS — D649 Anemia, unspecified: Secondary | ICD-10-CM

## 2017-04-28 DIAGNOSIS — I2510 Atherosclerotic heart disease of native coronary artery without angina pectoris: Secondary | ICD-10-CM | POA: Diagnosis not present

## 2017-04-28 DIAGNOSIS — I252 Old myocardial infarction: Secondary | ICD-10-CM

## 2017-04-28 DIAGNOSIS — Z800 Family history of malignant neoplasm of digestive organs: Secondary | ICD-10-CM | POA: Insufficient documentation

## 2017-04-28 DIAGNOSIS — Z7982 Long term (current) use of aspirin: Secondary | ICD-10-CM | POA: Diagnosis not present

## 2017-04-28 DIAGNOSIS — F419 Anxiety disorder, unspecified: Secondary | ICD-10-CM | POA: Insufficient documentation

## 2017-04-28 DIAGNOSIS — Z803 Family history of malignant neoplasm of breast: Secondary | ICD-10-CM | POA: Diagnosis not present

## 2017-04-28 DIAGNOSIS — R739 Hyperglycemia, unspecified: Secondary | ICD-10-CM

## 2017-04-28 DIAGNOSIS — G4730 Sleep apnea, unspecified: Secondary | ICD-10-CM | POA: Diagnosis not present

## 2017-04-28 DIAGNOSIS — K449 Diaphragmatic hernia without obstruction or gangrene: Secondary | ICD-10-CM | POA: Diagnosis not present

## 2017-04-28 DIAGNOSIS — K219 Gastro-esophageal reflux disease without esophagitis: Secondary | ICD-10-CM

## 2017-04-28 DIAGNOSIS — E785 Hyperlipidemia, unspecified: Secondary | ICD-10-CM | POA: Insufficient documentation

## 2017-04-28 DIAGNOSIS — E669 Obesity, unspecified: Secondary | ICD-10-CM | POA: Diagnosis not present

## 2017-04-28 DIAGNOSIS — M129 Arthropathy, unspecified: Secondary | ICD-10-CM

## 2017-04-28 LAB — CBC WITH DIFFERENTIAL/PLATELET
Basophils Absolute: 0 10*3/uL (ref 0–0.1)
Basophils Relative: 1 %
EOS ABS: 0.3 10*3/uL (ref 0–0.7)
EOS PCT: 6 %
HCT: 37.6 % (ref 35.0–47.0)
Hemoglobin: 11.9 g/dL — ABNORMAL LOW (ref 12.0–16.0)
LYMPHS ABS: 1.2 10*3/uL (ref 1.0–3.6)
Lymphocytes Relative: 21 %
MCH: 26 pg (ref 26.0–34.0)
MCHC: 31.7 g/dL — AB (ref 32.0–36.0)
MCV: 82.1 fL (ref 80.0–100.0)
MONOS PCT: 12 %
Monocytes Absolute: 0.7 10*3/uL (ref 0.2–0.9)
Neutro Abs: 3.4 10*3/uL (ref 1.4–6.5)
Neutrophils Relative %: 60 %
PLATELETS: 237 10*3/uL (ref 150–440)
RBC: 4.59 MIL/uL (ref 3.80–5.20)
RDW: 15.2 % — ABNORMAL HIGH (ref 11.5–14.5)
WBC: 5.6 10*3/uL (ref 3.6–11.0)

## 2017-04-28 LAB — IRON AND TIBC
Iron: 30 ug/dL (ref 28–170)
SATURATION RATIOS: 6 % — AB (ref 10.4–31.8)
TIBC: 490 ug/dL — AB (ref 250–450)
UIBC: 460 ug/dL

## 2017-04-28 LAB — LIPID PANEL
CHOL/HDL RATIO: 5.5 ratio
Cholesterol: 224 mg/dL — ABNORMAL HIGH (ref 0–200)
HDL: 41 mg/dL (ref >40)
LDL CALC: 152 mg/dL — AB (ref 0–99)
TRIGLYCERIDES: 155 mg/dL — AB (ref <150)
VLDL: 31 mg/dL (ref 0–40)

## 2017-04-28 LAB — FERRITIN: Ferritin: 7 ng/mL — ABNORMAL LOW (ref 11–307)

## 2017-04-28 MED ORDER — SODIUM CHLORIDE 0.9 % IV SOLN
INTRAVENOUS | Status: DC
  Administered 2017-04-28: 10:00:00 via INTRAVENOUS
  Filled 2017-04-28: qty 1000

## 2017-04-28 MED ORDER — SODIUM CHLORIDE 0.9 % IV SOLN
510.0000 mg | Freq: Once | INTRAVENOUS | Status: AC
  Administered 2017-04-28: 510 mg via INTRAVENOUS
  Filled 2017-04-28: qty 17

## 2017-04-29 ENCOUNTER — Other Ambulatory Visit: Payer: Self-pay

## 2017-05-05 ENCOUNTER — Inpatient Hospital Stay: Payer: Medicare Other

## 2017-05-05 VITALS — BP 152/86 | HR 91 | Temp 98.0°F | Resp 18

## 2017-05-05 DIAGNOSIS — I252 Old myocardial infarction: Secondary | ICD-10-CM | POA: Diagnosis not present

## 2017-05-05 DIAGNOSIS — K219 Gastro-esophageal reflux disease without esophagitis: Secondary | ICD-10-CM | POA: Diagnosis not present

## 2017-05-05 DIAGNOSIS — I10 Essential (primary) hypertension: Secondary | ICD-10-CM | POA: Diagnosis not present

## 2017-05-05 DIAGNOSIS — I209 Angina pectoris, unspecified: Secondary | ICD-10-CM | POA: Diagnosis not present

## 2017-05-05 DIAGNOSIS — Z803 Family history of malignant neoplasm of breast: Secondary | ICD-10-CM | POA: Diagnosis not present

## 2017-05-05 DIAGNOSIS — K449 Diaphragmatic hernia without obstruction or gangrene: Secondary | ICD-10-CM | POA: Diagnosis not present

## 2017-05-05 DIAGNOSIS — Z7982 Long term (current) use of aspirin: Secondary | ICD-10-CM | POA: Diagnosis not present

## 2017-05-05 DIAGNOSIS — D509 Iron deficiency anemia, unspecified: Secondary | ICD-10-CM | POA: Diagnosis not present

## 2017-05-05 DIAGNOSIS — Z79899 Other long term (current) drug therapy: Secondary | ICD-10-CM | POA: Diagnosis not present

## 2017-05-05 DIAGNOSIS — M129 Arthropathy, unspecified: Secondary | ICD-10-CM | POA: Diagnosis not present

## 2017-05-05 DIAGNOSIS — G4730 Sleep apnea, unspecified: Secondary | ICD-10-CM | POA: Diagnosis not present

## 2017-05-05 DIAGNOSIS — E785 Hyperlipidemia, unspecified: Secondary | ICD-10-CM | POA: Diagnosis not present

## 2017-05-05 DIAGNOSIS — R739 Hyperglycemia, unspecified: Secondary | ICD-10-CM | POA: Diagnosis not present

## 2017-05-05 DIAGNOSIS — I2510 Atherosclerotic heart disease of native coronary artery without angina pectoris: Secondary | ICD-10-CM | POA: Diagnosis not present

## 2017-05-05 DIAGNOSIS — Z800 Family history of malignant neoplasm of digestive organs: Secondary | ICD-10-CM | POA: Diagnosis not present

## 2017-05-05 MED ORDER — SODIUM CHLORIDE 0.9 % IV SOLN
INTRAVENOUS | Status: DC
  Administered 2017-05-05: 14:00:00 via INTRAVENOUS
  Filled 2017-05-05: qty 1000

## 2017-05-05 MED ORDER — SODIUM CHLORIDE 0.9 % IV SOLN
510.0000 mg | Freq: Once | INTRAVENOUS | Status: AC
  Administered 2017-05-05: 510 mg via INTRAVENOUS
  Filled 2017-05-05: qty 17

## 2017-05-05 NOTE — Progress Notes (Signed)
Dr. Grayland Ormond aware of patient's high blood pressure.  Patient stated she would take her bp medication as soon as she got home.  Dr. Grayland Ormond gave order to continue with feraheme treatment for today.

## 2017-06-14 ENCOUNTER — Encounter: Payer: Self-pay | Admitting: Family Medicine

## 2017-07-01 ENCOUNTER — Telehealth: Payer: Self-pay

## 2017-07-01 NOTE — Telephone Encounter (Signed)
Received handicap placard form for patient, called and left a message to return call, ok for pec to speak to patient and ask what she needs it for, is she able to walk 200 feet without stopping? Can she walk without the use of or assistance of a cane, brace, crutch or another person? Uses oxygen? Is totally blind? Or severely limited in their ability to walk due to arthritic, neurological or orthopedic condition?

## 2017-07-01 NOTE — Telephone Encounter (Signed)
Patient states this a replacement placard. Needing due to being unable to walk far distance without needing to stop and rest, shortness of breath, history of bypass

## 2017-07-01 NOTE — Telephone Encounter (Signed)
fyi

## 2017-07-01 NOTE — Telephone Encounter (Signed)
Noted. Ok to be signed. Do you still have the form so I can sign it?

## 2017-07-02 NOTE — Telephone Encounter (Signed)
In red folder. 

## 2017-07-02 NOTE — Telephone Encounter (Signed)
Signed.

## 2017-07-13 ENCOUNTER — Inpatient Hospital Stay: Admission: RE | Admit: 2017-07-13 | Payer: Self-pay | Source: Ambulatory Visit

## 2017-07-27 ENCOUNTER — Encounter: Payer: Self-pay | Admitting: Family Medicine

## 2017-07-27 DIAGNOSIS — Z0289 Encounter for other administrative examinations: Secondary | ICD-10-CM

## 2017-08-29 NOTE — Progress Notes (Signed)
[place]  [phone] [phone]) [phone]  ID: [name] OB: [birth year]  MR#: [id number]  AOZ#:[id number]  Patient Care Team: [name], MD as PCP - General (Family Medicine) [name], MD (Internal Medicine) [name], MD as Consulting Physician (Cardiothoracic Surgery) [name], MD as Consulting Physician (Cardiology)  CHIEF COMPLAINT: Iron deficiency anemia.  INTERVAL HISTORY: Patient returns to clinic today for repeat laboratory work and further evaluation. She continues to complain of decreased energy, but otherwise feels well. She has no neurologic complaints. She denies any recent fevers or illnesses. She has a good appetite and denies weight loss. She has no chest pain, cough, or shortness of breath. She denies any nausea, vomiting, constipation, or diarrhea. She denies any melena or hematochezia. She has no urinary complaints. Patient offers no further specific complaints today.  REVIEW OF SYSTEMS:   Review of Systems  Constitutional: Positive for malaise/fatigue. Negative for fever and weight loss.  Respiratory: Negative.  Negative for cough and shortness of breath.   Cardiovascular: Negative.  Negative for chest pain and leg swelling.  Gastrointestinal: Negative.  Negative for abdominal pain, blood in stool and melena.  Genitourinary: Negative.  Negative for hematuria.  Musculoskeletal: Negative.  Negative for joint pain.  Skin: Negative.  Negative for rash.  Neurological: Negative.  Negative for sensory change, focal weakness and weakness.  Psychiatric/Behavioral: Negative.  The patient is not nervous/anxious and does not have insomnia.     As per HPI. Otherwise, a complete review of systems is negative.  PAST MEDICAL HISTORY: Past Medical History:  Diagnosis Date  . Anemia   . Anginal pain ([place])   . Anxiety   . Arthritis   . CAD (coronary artery disease)   . Carpal tunnel syndrome    bilateral  . Cataract    bil removed cataracts  . Coronary artery disease    2x stents, Dr. Clayborn Bigness  . Depression   . Dysrhythmia   . Family history of adverse reaction to anesthesia    "brother; S/P lipotripsy in [place]; transferred to [facility]; had to put him on life support for 2 days"  . GERD (gastroesophageal reflux disease)   . Headache    "weekly" (09/13/2014)  . History of blood transfusion 05/2014   "we haven't figured out why I needed it yet" (09/13/2014)  . History of hiatal hernia   . Hyperglycemia   . Hyperlipidemia   . Hypertension   . Myocardial infarct ([place])   . Myocardial infarct, old 1999   15 years ago  . Obesity   . PONV (postoperative nausea and vomiting)   . S/P CABG x 4 09/18/2014   LIMA to LAD, SVG to PDA-dRCA sequentially, SVG to OM, EVH via right thigh   . Sleep apnea   . Sleep apnea    "don't use a mask" (09/13/2014)  . Unstable angina ([place])     PAST SURGICAL HISTORY: Past Surgical History:  Procedure Laterality Date  . ABDOMINAL HYSTERECTOMY    . APPENDECTOMY    . BACK SURGERY    . BLADDER SURGERY    . CARDIAC CATHETERIZATION  06/2014  . CARDIAC CATHETERIZATION N/A 06/16/2016   Procedure: Left Heart Cath and Cors/Grafts Angiography;  Surgeon: Burnell Blanks, MD;  Location: [place] [facility];  Service: Cardiovascular;  Laterality: N/A;  . CARPAL TUNNEL RELEASE Left   . CATARACT EXTRACTION W/ INTRAOCULAR LENS  IMPLANT, BILATERAL    . CHOLECYSTECTOMY    .  COLONOSCOPY    . CORONARY ANGIOPLASTY WITH STENT PLACEMENT  1999   [facility] x2 stent  . CORONARY ARTERY BYPASS GRAFT N/A 09/18/2014   Procedure: CORONARY ARTERY BYPASS GRAFTING (CABG)times four using LIMA  to LAD:SVG to  PD and DIST RCA;SVG to OM;, EVH Right thigh;  Surgeon: [name], MD;  Location: [place];  Service: Open Heart Surgery;  Laterality: N/A;  . ESOPHAGOGASTRODUODENOSCOPY N/A 09/17/2014   Procedure: ESOPHAGOGASTRODUODENOSCOPY (EGD);  Surgeon: Inda Castle, MD;   Location: [place];  Service: Endoscopy;  Laterality: N/A;  . heart stents     1999  . INCONTINENCE SURGERY    . INGUINAL HERNIA REPAIR    . INGUINAL HERNIA REPAIR Right X 3   "last one was in the 1990's; still have hernia there now" (09/13/2014)  . LAPAROSCOPIC CHOLECYSTECTOMY    . LUMBAR DISC SURGERY  1990's?  . LUMBAR SPINE SURGERY    . removal of ovaries    . TEE WITHOUT CARDIOVERSION N/A 09/18/2014   Procedure: TRANSESOPHAGEAL ECHOCARDIOGRAM (TEE);  Surgeon: [name], MD;  Location: [place];  Service: Open Heart Surgery;  Laterality: N/A;  . UPPER GASTROINTESTINAL ENDOSCOPY    . VAGINAL HYSTERECTOMY      FAMILY HISTORY: Family History  Problem Relation Age of Onset  . Coronary artery disease Mother   . Osteoporosis Mother   . Heart disease Mother   . Stroke Mother   . Heart attack Mother   . Hypertension Mother   . Hypertension Father   . Coronary artery disease Father   . COPD Father   . Heart disease Father   . Heart attack Father   . Glaucoma Father   . Osteoporosis Father   . Emphysema Father   . Cancer Sister 48       colon  . Hypertension Sister   . Colon cancer Sister        dx in her 40's  . Breast cancer Sister   . Stroke Brother   . Hypertension Brother   . Stroke Maternal Grandfather   . Stroke Maternal Grandmother   . Osteoporosis Maternal Grandmother   . Hypertension Maternal Grandmother   . Hypertension Paternal Grandmother   . Hypertension Paternal Grandfather   . Breast cancer Maternal Aunt        2 aunts-50's  . Esophageal cancer Neg Hx   . Stomach cancer Neg Hx   . Rectal cancer Neg Hx   . Pancreatic cancer Neg Hx     ADVANCED DIRECTIVES (Y/N):  N  HEALTH MAINTENANCE: Social History   Tobacco Use  . Smoking status: Never Smoker  . Smokeless tobacco: Never Used  Substance Use Topics  . Alcohol use: Yes    Alcohol/week: 0.0 oz    Comment: 09/13/2014 "might have a drink a couple times/yr"  . Drug use: No      Colonoscopy:  PAP:  Bone density:  Lipid panel:  Allergies  Allergen Reactions  . Ferrous Sulfate [Ferrous Fumarate] Other (See Comments)    STOMACH ISSUES  . Lipitor [Atorvastatin] Other (See Comments)    myalgia    Current Outpatient Medications  Medication Sig Dispense Refill  . aspirin EC 81 MG tablet Take 1 tablet (81 mg total) by mouth daily.    Marland Kitchen lisinopril (PRINIVIL,ZESTRIL) 5 MG tablet Take 1 tablet (5 mg total) by mouth daily. 90 tablet 3  . metoprolol succinate (TOPROL-XL) 50 MG 24 hr tablet TAKE 1 TABLET BY MOUTH ONCE DAILY 30  tablet 3  . pantoprazole (PROTONIX) 40 MG tablet TAKE 1 TABLET BY MOUTH TWICE DAILY 60 tablet 2  . rosuvastatin (CRESTOR) 10 MG tablet Take 1 tablet (10 mg total) daily by mouth. 30 tablet 2  . acetaminophen (TYLENOL) 500 MG tablet Take 500 mg by mouth every 6 (six) hours as needed for headache.    . albuterol (PROVENTIL HFA;VENTOLIN HFA) 108 (90 Base) MCG/ACT inhaler Inhale 2 puffs into the lungs every 6 (six) hours as needed for wheezing or shortness of breath. (Patient not taking: Reported on 04/28/2017) 1 Inhaler 2  . diazepam (VALIUM) 5 MG tablet Take one tablet (5 mg) on arrival for your MRI (Patient not taking: Reported on 09/01/2017) 1 tablet 0  . sertraline (ZOLOFT) 100 MG tablet TAKE 1 TABLET BY MOUTH ONCE DAILY (Patient not taking: Reported on 09/01/2017) 90 tablet 1   No current facility-administered medications for this visit.     OBJECTIVE: Vitals:   09/01/17 1350  BP: (!) 145/86  Pulse: 85  Resp: 18  Temp: 98.4 F (36.9 C)     Body mass index is 35.75 kg/m.    ECOG FS:0 - Asymptomatic  General: Well-developed, well-nourished, no acute distress. Eyes: Pink conjunctiva, anicteric sclera. Lungs: Clear to auscultation bilaterally. Heart: Regular rate and rhythm. No rubs, murmurs, or gallops. Abdomen: Soft, nontender, nondistended. No organomegaly noted, normoactive bowel sounds. Musculoskeletal: No edema, cyanosis, or  clubbing. Neuro: Alert, answering all questions appropriately. Cranial nerves grossly intact. Skin: No rashes or petechiae noted. Psych: Normal affect.  LAB RESULTS:  Lab Results  Component Value Date   NA 140 08/27/2016   K 4.8 08/27/2016   CL 108 08/27/2016   CO2 25 08/27/2016   GLUCOSE 125 (H) 08/27/2016   BUN 20 08/27/2016   CREATININE 0.88 08/27/2016   CALCIUM 9.0 08/27/2016   PROT 6.7 08/27/2016   ALBUMIN 4.1 08/27/2016   AST 11 08/27/2016   ALT 9 08/27/2016   ALKPHOS 63 08/27/2016   BILITOT 0.4 08/27/2016   GFRNONAA 57 (L) 06/17/2016   GFRAA >60 06/17/2016    Lab Results  Component Value Date   WBC 6.8 09/01/2017   NEUTROABS 4.0 09/01/2017   HGB 13.7 09/01/2017   HCT 40.5 09/01/2017   MCV 93.1 09/01/2017   PLT 254 09/01/2017   Lab Results  Component Value Date   IRON 63 09/01/2017   TIBC 402 09/01/2017   IRONPCTSAT 16 09/01/2017   Lab Results  Component Value Date   FERRITIN 21 09/01/2017     STUDIES: No results found.  ASSESSMENT: Iron deficiency anemia.  PLAN:    1. Iron deficiency anemia: Patient's hemoglobin and iron stores are now within normal limits. Previously she reported she cannot tolerate oral iron supplementation. The remainder of her laboratory work is either negative or within normal limits.  Patient had a normal endoscopy in May 2017 and a normal colonoscopy in August 2014.  She does not require additional IV Feraheme today.  Patient last received treatment on May 05, 2017.  No intervention is needed at this time.  Return to clinic in 4 months with repeat laboratory work and further evaluation.  Approximately 20 minutes spent in discussion of which greater than 50% was consultation.   Patient expressed understanding and was in agreement with this plan. She also understands that She can call clinic at any time with any questions, concerns, or complaints.    Lloyd Huger, MD   09/03/2017 5:07 PM

## 2017-08-30 ENCOUNTER — Other Ambulatory Visit: Payer: Self-pay | Admitting: Family Medicine

## 2017-08-30 ENCOUNTER — Other Ambulatory Visit: Payer: Self-pay | Admitting: Internal Medicine

## 2017-08-30 DIAGNOSIS — F32A Depression, unspecified: Secondary | ICD-10-CM

## 2017-08-30 DIAGNOSIS — F329 Major depressive disorder, single episode, unspecified: Secondary | ICD-10-CM

## 2017-08-30 DIAGNOSIS — K2270 Barrett's esophagus without dysplasia: Secondary | ICD-10-CM

## 2017-08-30 NOTE — Telephone Encounter (Signed)
We haven't seen her back- it doesn't look like she ever had her cardiac mri done.   I presume Dr. Clayborn Bigness is seeing her- would have his office fill for her.

## 2017-08-30 NOTE — Telephone Encounter (Signed)
Can you please advise if this is something Dr. Caryl Comes should continue refilling for patient.  Looks like [facility] Referred her to [place]. Patient last seen Caryl Comes 06/2016.

## 2017-08-31 ENCOUNTER — Other Ambulatory Visit: Payer: Self-pay | Admitting: *Deleted

## 2017-08-31 DIAGNOSIS — D649 Anemia, unspecified: Secondary | ICD-10-CM

## 2017-09-01 ENCOUNTER — Other Ambulatory Visit: Payer: Self-pay

## 2017-09-01 ENCOUNTER — Inpatient Hospital Stay (HOSPITAL_BASED_OUTPATIENT_CLINIC_OR_DEPARTMENT_OTHER): Payer: Medicare Other | Admitting: Oncology

## 2017-09-01 ENCOUNTER — Inpatient Hospital Stay: Payer: Medicare Other

## 2017-09-01 ENCOUNTER — Inpatient Hospital Stay: Payer: Medicare Other | Attending: Oncology

## 2017-09-01 VITALS — BP 145/86 | HR 85 | Temp 98.4°F | Resp 18 | Wt 198.6 lb

## 2017-09-01 DIAGNOSIS — E785 Hyperlipidemia, unspecified: Secondary | ICD-10-CM | POA: Insufficient documentation

## 2017-09-01 DIAGNOSIS — Z800 Family history of malignant neoplasm of digestive organs: Secondary | ICD-10-CM | POA: Diagnosis not present

## 2017-09-01 DIAGNOSIS — E669 Obesity, unspecified: Secondary | ICD-10-CM | POA: Insufficient documentation

## 2017-09-01 DIAGNOSIS — Z7982 Long term (current) use of aspirin: Secondary | ICD-10-CM

## 2017-09-01 DIAGNOSIS — R5383 Other fatigue: Secondary | ICD-10-CM | POA: Diagnosis not present

## 2017-09-01 DIAGNOSIS — I209 Angina pectoris, unspecified: Secondary | ICD-10-CM | POA: Insufficient documentation

## 2017-09-01 DIAGNOSIS — K449 Diaphragmatic hernia without obstruction or gangrene: Secondary | ICD-10-CM | POA: Insufficient documentation

## 2017-09-01 DIAGNOSIS — K219 Gastro-esophageal reflux disease without esophagitis: Secondary | ICD-10-CM | POA: Insufficient documentation

## 2017-09-01 DIAGNOSIS — I2510 Atherosclerotic heart disease of native coronary artery without angina pectoris: Secondary | ICD-10-CM

## 2017-09-01 DIAGNOSIS — D649 Anemia, unspecified: Secondary | ICD-10-CM

## 2017-09-01 DIAGNOSIS — I252 Old myocardial infarction: Secondary | ICD-10-CM | POA: Insufficient documentation

## 2017-09-01 DIAGNOSIS — F418 Other specified anxiety disorders: Secondary | ICD-10-CM | POA: Diagnosis not present

## 2017-09-01 DIAGNOSIS — I10 Essential (primary) hypertension: Secondary | ICD-10-CM | POA: Diagnosis not present

## 2017-09-01 DIAGNOSIS — D509 Iron deficiency anemia, unspecified: Secondary | ICD-10-CM | POA: Diagnosis not present

## 2017-09-01 DIAGNOSIS — Z803 Family history of malignant neoplasm of breast: Secondary | ICD-10-CM | POA: Diagnosis not present

## 2017-09-01 DIAGNOSIS — G4730 Sleep apnea, unspecified: Secondary | ICD-10-CM

## 2017-09-01 DIAGNOSIS — F509 Eating disorder, unspecified: Secondary | ICD-10-CM | POA: Diagnosis present

## 2017-09-01 DIAGNOSIS — Z79899 Other long term (current) drug therapy: Secondary | ICD-10-CM | POA: Insufficient documentation

## 2017-09-01 LAB — FERRITIN: FERRITIN: 21 ng/mL (ref 11–307)

## 2017-09-01 LAB — CBC WITH DIFFERENTIAL/PLATELET
Basophils Absolute: 0 10*3/uL (ref 0–0.1)
Basophils Relative: 1 %
EOS ABS: 0.2 10*3/uL (ref 0–0.7)
Eosinophils Relative: 4 %
HCT: 40.5 % (ref 35.0–47.0)
HEMOGLOBIN: 13.7 g/dL (ref 12.0–16.0)
LYMPHS ABS: 1.8 10*3/uL (ref 1.0–3.6)
LYMPHS PCT: 27 %
MCH: 31.5 pg (ref 26.0–34.0)
MCHC: 33.8 g/dL (ref 32.0–36.0)
MCV: 93.1 fL (ref 80.0–100.0)
Monocytes Absolute: 0.7 10*3/uL (ref 0.2–0.9)
Monocytes Relative: 10 %
NEUTROS PCT: 58 %
Neutro Abs: 4 10*3/uL (ref 1.4–6.5)
PLATELETS: 254 10*3/uL (ref 150–440)
RBC: 4.35 MIL/uL (ref 3.80–5.20)
RDW: 13.9 % (ref 11.5–14.5)
WBC: 6.8 10*3/uL (ref 3.6–11.0)

## 2017-09-01 LAB — IRON AND TIBC
Iron: 63 ug/dL (ref 28–170)
SATURATION RATIOS: 16 % (ref 10.4–31.8)
TIBC: 402 ug/dL (ref 250–450)
UIBC: 339 ug/dL

## 2017-09-01 NOTE — Progress Notes (Signed)
Here for follow up. Stated that energy level lower than before. Planning a trip to [place] in 1 week.

## 2017-09-02 ENCOUNTER — Ambulatory Visit: Payer: Self-pay | Admitting: Family Medicine

## 2017-09-02 DIAGNOSIS — Z0289 Encounter for other administrative examinations: Secondary | ICD-10-CM

## 2017-09-26 ENCOUNTER — Encounter: Payer: Self-pay | Admitting: Gastroenterology

## 2017-09-30 ENCOUNTER — Encounter: Payer: Self-pay | Admitting: Family Medicine

## 2017-10-25 ENCOUNTER — Encounter: Payer: Self-pay | Admitting: Gastroenterology

## 2017-11-03 ENCOUNTER — Encounter: Payer: Self-pay | Admitting: Family Medicine

## 2017-11-15 ENCOUNTER — Encounter: Payer: Self-pay | Admitting: Gastroenterology

## 2017-11-26 ENCOUNTER — Ambulatory Visit
Admission: RE | Admit: 2017-11-26 | Discharge: 2017-11-26 | Disposition: A | Discharge status: Home / Self Care | Payer: Medicare Other | Source: Ambulatory Visit | Attending: Family Medicine | Admitting: Family Medicine

## 2017-11-26 DIAGNOSIS — Z1231 Encounter for screening mammogram for malignant neoplasm of breast: Secondary | ICD-10-CM | POA: Diagnosis not present

## 2017-12-27 NOTE — Progress Notes (Signed)
[place]  Telephone:(336) [phone] [phone]) [phone]  ID: Debbie Delacruz OB: [birth year]  MR#: [id number]  LPF#:[id number]  Patient Care Team: [name], MD as PCP - General (Family Medicine) [name], MD (Internal Medicine) [name], MD as Consulting Physician (Cardiothoracic Surgery) [name], MD as Consulting Physician (Cardiology)  CHIEF COMPLAINT: Iron deficiency anemia.  INTERVAL HISTORY: Patient returns to clinic today for repeat laboratory work and further evaluation.  She currently feels well and is asymptomatic.  She does not complain of weakness or fatigue today.  She has no neurologic complaints. She denies any recent fevers or illnesses. She has a good appetite and denies weight loss. She has no chest pain, cough, or shortness of breath. She denies any nausea, vomiting, constipation, or diarrhea. She denies any melena or hematochezia. She has no urinary complaints.  Patient offers no specific complaints today.  REVIEW OF SYSTEMS:   Review of Systems  Constitutional: Negative.  Negative for fever, malaise/fatigue and weight loss.  Respiratory: Negative.  Negative for cough and shortness of breath.   Cardiovascular: Negative.  Negative for chest pain and leg swelling.  Gastrointestinal: Negative.  Negative for abdominal pain, blood in stool and melena.  Genitourinary: Negative.  Negative for hematuria.  Musculoskeletal: Negative.  Negative for joint pain.  Skin: Negative.  Negative for rash.  Neurological: Negative.  Negative for sensory change, focal weakness and weakness.  Psychiatric/Behavioral: Negative.  The patient is not nervous/anxious and does not have insomnia.     As per HPI. Otherwise, a complete review of systems is negative.  PAST MEDICAL HISTORY: Past Medical History:  Diagnosis Date  . Anemia   . Anginal pain ([place])   . Anxiety   . Arthritis   . CAD (coronary artery disease)   . Carpal  tunnel syndrome    bilateral  . Cataract    bil removed cataracts  . Coronary artery disease    2x stents, Dr. Clayborn Bigness  . Depression   . Dysrhythmia   . Family history of adverse reaction to anesthesia    "brother; S/P lipotripsy in [place]; transferred to [facility]; had to put him on life support for 2 days"  . GERD (gastroesophageal reflux disease)   . Headache    "weekly" (09/13/2014)  . History of blood transfusion 05/2014   "we haven't figured out why I needed it yet" (09/13/2014)  . History of hiatal hernia   . Hyperglycemia   . Hyperlipidemia   . Hypertension   . Myocardial infarct ([place])   . Myocardial infarct, old 1999   15 years ago  . Obesity   . PONV (postoperative nausea and vomiting)   . S/P CABG x 4 09/18/2014   LIMA to LAD, SVG to PDA-dRCA sequentially, SVG to OM, EVH via right thigh   . Sleep apnea   . Sleep apnea    "don't use a mask" (09/13/2014)  . Unstable angina ([place])     PAST SURGICAL HISTORY: Past Surgical History:  Procedure Laterality Date  . ABDOMINAL HYSTERECTOMY    . APPENDECTOMY    . BACK SURGERY    . BLADDER SURGERY    . CARDIAC CATHETERIZATION  06/2014  . CARDIAC CATHETERIZATION N/A 06/16/2016   Procedure: Left Heart Cath and Cors/Grafts Angiography;  Surgeon: Burnell Blanks, MD;  Location: [place] [facility];  Service: Cardiovascular;  Laterality: N/A;  . CARPAL TUNNEL RELEASE Left   . CATARACT EXTRACTION W/ INTRAOCULAR LENS  IMPLANT,  BILATERAL    . CHOLECYSTECTOMY    . COLONOSCOPY    . CORONARY ANGIOPLASTY WITH STENT PLACEMENT  1999   [facility] x2 stent  . CORONARY ARTERY BYPASS GRAFT N/A 09/18/2014   Procedure: CORONARY ARTERY BYPASS GRAFTING (CABG)times four using LIMA  to LAD:SVG to  PD and DIST RCA;SVG to OM;, EVH Right thigh;  Surgeon: [name], MD;  Location: [place];  Service: Open Heart Surgery;  Laterality: N/A;  . ESOPHAGOGASTRODUODENOSCOPY N/A 09/17/2014   Procedure: ESOPHAGOGASTRODUODENOSCOPY (EGD);  Surgeon:  Inda Castle, MD;  Location: [facility];  Service: Endoscopy;  Laterality: N/A;  . heart stents     1999  . INCONTINENCE SURGERY    . INGUINAL HERNIA REPAIR    . INGUINAL HERNIA REPAIR Right X 3   "last one was in the 1990's; still have hernia there now" (09/13/2014)  . LAPAROSCOPIC CHOLECYSTECTOMY    . LUMBAR DISC SURGERY  1990's?  . LUMBAR SPINE SURGERY    . removal of ovaries    . TEE WITHOUT CARDIOVERSION N/A 09/18/2014   Procedure: TRANSESOPHAGEAL ECHOCARDIOGRAM (TEE);  Surgeon: [name], MD;  Location: [facility];  Service: Open Heart Surgery;  Laterality: N/A;  . UPPER GASTROINTESTINAL ENDOSCOPY    . VAGINAL HYSTERECTOMY      FAMILY HISTORY: Family History  Problem Relation Age of Onset  . Coronary artery disease Mother   . Osteoporosis Mother   . Heart disease Mother   . Stroke Mother   . Heart attack Mother   . Hypertension Mother   . Hypertension Father   . Coronary artery disease Father   . COPD Father   . Heart disease Father   . Heart attack Father   . Glaucoma Father   . Osteoporosis Father   . Emphysema Father   . Cancer Sister 2       colon  . Hypertension Sister   . Colon cancer Sister        dx in her 93's  . Breast cancer Sister   . Stroke Brother   . Hypertension Brother   . Stroke Maternal Grandfather   . Stroke Maternal Grandmother   . Osteoporosis Maternal Grandmother   . Hypertension Maternal Grandmother   . Hypertension Paternal Grandmother   . Hypertension Paternal Grandfather   . Breast cancer Maternal Aunt        2 aunts-50's  . Esophageal cancer Neg Hx   . Stomach cancer Neg Hx   . Rectal cancer Neg Hx   . Pancreatic cancer Neg Hx     ADVANCED DIRECTIVES (Y/N):  N  HEALTH MAINTENANCE: Social History   Tobacco Use  . Smoking status: Never Smoker  . Smokeless tobacco: Never Used  Substance Use Topics  . Alcohol use: Yes    Alcohol/week: 0.0 standard drinks    Comment: 09/13/2014 "might have a drink a couple times/yr"    . Drug use: No     Colonoscopy:  PAP:  Bone density:  Lipid panel:  Allergies  Allergen Reactions  . Ferrous Sulfate [Ferrous Fumarate] Other (See Comments)    STOMACH ISSUES  . Lipitor [Atorvastatin] Other (See Comments)    myalgia    Current Outpatient Medications  Medication Sig Dispense Refill  . acetaminophen (TYLENOL) 500 MG tablet Take 500 mg by mouth every 6 (six) hours as needed for headache.    Marland Kitchen aspirin EC 81 MG tablet Take 1 tablet (81 mg total) by mouth daily.    . diazepam (  VALIUM) 5 MG tablet Take one tablet (5 mg) on arrival for your MRI 1 tablet 0  . metoprolol succinate (TOPROL-XL) 50 MG 24 hr tablet TAKE 1 TABLET BY MOUTH ONCE DAILY 30 tablet 3  . pantoprazole (PROTONIX) 40 MG tablet TAKE 1 TABLET BY MOUTH TWICE DAILY 60 tablet 2  . rosuvastatin (CRESTOR) 10 MG tablet Take 1 tablet (10 mg total) daily by mouth. 30 tablet 2  . sertraline (ZOLOFT) 100 MG tablet TAKE 1 TABLET BY MOUTH ONCE DAILY 90 tablet 1  . albuterol (PROVENTIL HFA;VENTOLIN HFA) 108 (90 Base) MCG/ACT inhaler Inhale 2 puffs into the lungs every 6 (six) hours as needed for wheezing or shortness of breath. (Patient not taking: Reported on 04/28/2017) 1 Inhaler 2  . lisinopril (PRINIVIL,ZESTRIL) 5 MG tablet Take 1 tablet (5 mg total) by mouth daily. 90 tablet 3   No current facility-administered medications for this visit.     OBJECTIVE: Vitals:   12/28/17 1419  BP: (!) 189/100  Pulse: 89  Resp: 18  Temp: 98.7 F (37.1 C)     Body mass index is 35.12 kg/m.    ECOG FS:0 - Asymptomatic  General: Well-developed, well-nourished, no acute distress. Eyes: Pink conjunctiva, anicteric sclera. HEENT: Normocephalic, moist mucous membranes. Lungs: Clear to auscultation bilaterally. Heart: Regular rate and rhythm. No rubs, murmurs, or gallops. Abdomen: Soft, nontender, nondistended. No organomegaly noted, normoactive bowel sounds. Musculoskeletal: No edema, cyanosis, or clubbing. Neuro: Alert,  answering all questions appropriately. Cranial nerves grossly intact. Skin: No rashes or petechiae noted. Psych: Normal affect.  LAB RESULTS:  Lab Results  Component Value Date   NA 140 08/27/2016   K 4.8 08/27/2016   CL 108 08/27/2016   CO2 25 08/27/2016   GLUCOSE 125 (H) 08/27/2016   BUN 20 08/27/2016   CREATININE 0.88 08/27/2016   CALCIUM 9.0 08/27/2016   PROT 6.7 08/27/2016   ALBUMIN 4.1 08/27/2016   AST 11 08/27/2016   ALT 9 08/27/2016   ALKPHOS 63 08/27/2016   BILITOT 0.4 08/27/2016   GFRNONAA 57 (L) 06/17/2016   GFRAA >60 06/17/2016    Lab Results  Component Value Date   WBC 6.7 12/28/2017   NEUTROABS 3.8 12/28/2017   HGB 13.9 12/28/2017   HCT 41.7 12/28/2017   MCV 91.6 12/28/2017   PLT 224 12/28/2017   Lab Results  Component Value Date   IRON 46 12/28/2017   TIBC 404 12/28/2017   IRONPCTSAT 11 12/28/2017   Lab Results  Component Value Date   FERRITIN 18 12/28/2017     STUDIES: No results found.  ASSESSMENT: Iron deficiency anemia.  PLAN:    1. Iron deficiency anemia: Patient's hemoglobin and iron stores continue to be within normal limits.  Previously, she reported she cannot tolerate oral iron supplementation. The remainder of her laboratory work is either negative or within normal limits.  Patient had a normal endoscopy in May 2017 and a normal colonoscopy in August 2014.  She does not require additional IV Feraheme today.  Patient last received treatment on May 05, 2017.  No intervention is needed at this time.  Return to clinic in 6 months with repeat laboratory work and further evaluation.  If patient's hemoglobin and iron stores continue to be within normal limits at that time, she likely can be discharged from clinic. 2.  Hypertension: Patient's blood pressure significantly elevated today.  Continue current medications as prescribed and follow-up with PCP for further management.   Patient expressed understanding and was in agreement  with  this plan. She also understands that She can call clinic at any time with any questions, concerns, or complaints.    Lloyd Huger, MD   01/01/2018 8:12 AM

## 2017-12-28 ENCOUNTER — Encounter: Payer: Self-pay | Admitting: Oncology

## 2017-12-28 ENCOUNTER — Inpatient Hospital Stay (HOSPITAL_BASED_OUTPATIENT_CLINIC_OR_DEPARTMENT_OTHER): Payer: Medicare Other | Admitting: Oncology

## 2017-12-28 ENCOUNTER — Inpatient Hospital Stay: Payer: Medicare Other

## 2017-12-28 ENCOUNTER — Inpatient Hospital Stay: Payer: Medicare Other | Attending: Oncology

## 2017-12-28 VITALS — BP 189/100 | HR 89 | Temp 98.7°F | Resp 18 | Wt 195.1 lb

## 2017-12-28 DIAGNOSIS — Z800 Family history of malignant neoplasm of digestive organs: Secondary | ICD-10-CM | POA: Insufficient documentation

## 2017-12-28 DIAGNOSIS — D509 Iron deficiency anemia, unspecified: Secondary | ICD-10-CM

## 2017-12-28 DIAGNOSIS — I252 Old myocardial infarction: Secondary | ICD-10-CM | POA: Diagnosis not present

## 2017-12-28 DIAGNOSIS — G4730 Sleep apnea, unspecified: Secondary | ICD-10-CM | POA: Diagnosis not present

## 2017-12-28 DIAGNOSIS — E785 Hyperlipidemia, unspecified: Secondary | ICD-10-CM | POA: Insufficient documentation

## 2017-12-28 DIAGNOSIS — Z803 Family history of malignant neoplasm of breast: Secondary | ICD-10-CM

## 2017-12-28 DIAGNOSIS — D449 Neoplasm of uncertain behavior of unspecified endocrine gland: Secondary | ICD-10-CM | POA: Diagnosis not present

## 2017-12-28 DIAGNOSIS — Z7982 Long term (current) use of aspirin: Secondary | ICD-10-CM

## 2017-12-28 DIAGNOSIS — E669 Obesity, unspecified: Secondary | ICD-10-CM | POA: Diagnosis not present

## 2017-12-28 DIAGNOSIS — I10 Essential (primary) hypertension: Secondary | ICD-10-CM

## 2017-12-28 DIAGNOSIS — F418 Other specified anxiety disorders: Secondary | ICD-10-CM | POA: Diagnosis not present

## 2017-12-28 DIAGNOSIS — K219 Gastro-esophageal reflux disease without esophagitis: Secondary | ICD-10-CM

## 2017-12-28 DIAGNOSIS — Z79899 Other long term (current) drug therapy: Secondary | ICD-10-CM | POA: Diagnosis not present

## 2017-12-28 DIAGNOSIS — I2510 Atherosclerotic heart disease of native coronary artery without angina pectoris: Secondary | ICD-10-CM | POA: Insufficient documentation

## 2017-12-28 LAB — IRON AND TIBC
Iron: 46 ug/dL (ref 28–170)
Saturation Ratios: 11 % (ref 10.4–31.8)
TIBC: 404 ug/dL (ref 250–450)
UIBC: 358 ug/dL

## 2017-12-28 LAB — CBC WITH DIFFERENTIAL/PLATELET
BASOS ABS: 0 10*3/uL (ref 0–0.1)
Basophils Relative: 1 %
Eosinophils Absolute: 0.2 10*3/uL (ref 0–0.7)
Eosinophils Relative: 3 %
HEMATOCRIT: 41.7 % (ref 35.0–47.0)
Hemoglobin: 13.9 g/dL (ref 12.0–16.0)
LYMPHS PCT: 30 %
Lymphs Abs: 2 10*3/uL (ref 1.0–3.6)
MCH: 30.5 pg (ref 26.0–34.0)
MCHC: 33.3 g/dL (ref 32.0–36.0)
MCV: 91.6 fL (ref 80.0–100.0)
MONO ABS: 0.6 10*3/uL (ref 0.2–0.9)
Monocytes Relative: 9 %
NEUTROS ABS: 3.8 10*3/uL (ref 1.4–6.5)
Neutrophils Relative %: 57 %
Platelets: 224 10*3/uL (ref 150–440)
RBC: 4.55 MIL/uL (ref 3.80–5.20)
RDW: 14.7 % — AB (ref 11.5–14.5)
WBC: 6.7 10*3/uL (ref 3.6–11.0)

## 2017-12-28 LAB — FERRITIN: FERRITIN: 18 ng/mL (ref 11–307)

## 2017-12-28 NOTE — Progress Notes (Signed)
Patient denies any concerns today.  

## 2018-01-05 ENCOUNTER — Ambulatory Visit (AMBULATORY_SURGERY_CENTER): Payer: Self-pay | Admitting: *Deleted

## 2018-01-05 VITALS — Ht 62.0 in | Wt 195.2 lb

## 2018-01-05 DIAGNOSIS — Z86010 Personal history of colonic polyps: Secondary | ICD-10-CM

## 2018-01-05 DIAGNOSIS — K2270 Barrett's esophagus without dysplasia: Secondary | ICD-10-CM

## 2018-01-05 MED ORDER — SUPREP BOWEL PREP KIT 17.5-3.13-1.6 GM/177ML PO SOLN: 1.0000 | Freq: Once | ORAL | 0 refills | Status: AC

## 2018-01-05 NOTE — Progress Notes (Signed)
No egg or soy allergy known to patient  No issues with past sedation with any surgeries  or procedures, no intubation problems  No diet pills per patient No home 02 use per patient  No blood thinners per patient  Pt denies issues with constipation  No A fib or A flutter  EMMI video offered, patient declined

## 2018-01-19 ENCOUNTER — Encounter: Payer: Self-pay | Admitting: Gastroenterology

## 2018-02-16 ENCOUNTER — Encounter: Payer: Self-pay | Admitting: Family Medicine

## 2018-03-08 ENCOUNTER — Other Ambulatory Visit: Payer: Self-pay | Admitting: Family Medicine

## 2018-03-08 ENCOUNTER — Telehealth: Payer: Self-pay | Admitting: Family Medicine

## 2018-03-08 DIAGNOSIS — F329 Major depressive disorder, single episode, unspecified: Secondary | ICD-10-CM

## 2018-03-08 DIAGNOSIS — F32A Depression, unspecified: Secondary | ICD-10-CM

## 2018-03-08 DIAGNOSIS — K2270 Barrett's esophagus without dysplasia: Secondary | ICD-10-CM

## 2018-03-08 NOTE — Telephone Encounter (Signed)
This patient requested refills on her medications.  She has not been seen since April 2018.  She needs to be scheduled for follow-up sometime in the next month or so.  This could be during a 430 appointment or on a Tuesday afternoon.  Please contact her to get this set up.  Once this is set up I will provide enough refills to get her to the appointment.

## 2018-03-08 NOTE — Telephone Encounter (Signed)
Sent to [place]. Please schedule patient for a same day appt since I am unable to do so.   Thanks

## 2018-03-21 ENCOUNTER — Ambulatory Visit: Payer: Self-pay | Admitting: Family Medicine

## 2018-03-21 NOTE — Telephone Encounter (Signed)
Pt has been scheduled.  °

## 2018-04-05 ENCOUNTER — Ambulatory Visit: Payer: Self-pay | Admitting: Family Medicine

## 2018-04-05 DIAGNOSIS — Z0289 Encounter for other administrative examinations: Secondary | ICD-10-CM

## 2018-04-08 ENCOUNTER — Other Ambulatory Visit: Payer: Self-pay | Admitting: Internal Medicine

## 2018-04-08 ENCOUNTER — Other Ambulatory Visit: Payer: Self-pay | Admitting: Family Medicine

## 2018-04-08 DIAGNOSIS — F32A Depression, unspecified: Secondary | ICD-10-CM

## 2018-04-08 DIAGNOSIS — F329 Major depressive disorder, single episode, unspecified: Secondary | ICD-10-CM

## 2018-04-11 ENCOUNTER — Telehealth: Payer: Self-pay | Admitting: Internal Medicine

## 2018-04-11 NOTE — Telephone Encounter (Signed)
lmov to schedule  °

## 2018-04-11 NOTE — Telephone Encounter (Signed)
-----   Message from Anselm Pancoast, [place] sent at 04/11/2018  1:38 PM EST ----- Please contact patient for a follow up appointment with Dr. Caryl Comes. The patient was last seen by Dr. Caryl Comes in 06/2016.  Thanks, Ivin Booty

## 2018-04-12 NOTE — Telephone Encounter (Signed)
The patient has not been seen in over a year.  I will send short-term refills and she needs to keep her appointment for her physical in January.

## 2018-04-13 NOTE — Telephone Encounter (Signed)
lmov to schedule  °

## 2018-04-25 NOTE — Telephone Encounter (Signed)
lmov to schedule  °

## 2018-04-26 ENCOUNTER — Encounter: Payer: Self-pay | Admitting: Internal Medicine

## 2018-04-26 NOTE — Telephone Encounter (Signed)
Sent letter

## 2018-05-30 ENCOUNTER — Encounter: Payer: Self-pay | Admitting: Family Medicine

## 2018-06-28 ENCOUNTER — Other Ambulatory Visit: Payer: Self-pay

## 2018-06-28 ENCOUNTER — Ambulatory Visit: Payer: Self-pay

## 2018-06-28 ENCOUNTER — Ambulatory Visit: Payer: Self-pay | Admitting: Oncology

## 2018-07-13 ENCOUNTER — Encounter: Payer: Self-pay | Admitting: Family Medicine

## 2018-07-13 ENCOUNTER — Ambulatory Visit: Payer: Self-pay

## 2018-07-17 NOTE — Progress Notes (Deleted)
[place]  Telephone:(336) [phone] Fax:(336) [phone]  ID: Debbie Delacruz OB: 1945/11/06  MR#: [id number]  GGE#:[id number]  Patient Care Team: [name], MD as PCP - General (Family Medicine) [name], MD (Internal Medicine) [name], MD as Consulting Physician (Cardiothoracic Surgery) [name], MD as Consulting Physician (Cardiology)  CHIEF COMPLAINT: Iron deficiency anemia.  INTERVAL HISTORY: Patient returns to clinic today for repeat laboratory work and further evaluation.  She currently feels well and is asymptomatic.  She does not complain of weakness or fatigue today.  She has no neurologic complaints. She denies any recent fevers or illnesses. She has a good appetite and denies weight loss. She has no chest pain, cough, or shortness of breath. She denies any nausea, vomiting, constipation, or diarrhea. She denies any melena or hematochezia. She has no urinary complaints.  Patient offers no specific complaints today.  REVIEW OF SYSTEMS:   Review of Systems  Constitutional: Negative.  Negative for fever, malaise/fatigue and weight loss.  Respiratory: Negative.  Negative for cough and shortness of breath.   Cardiovascular: Negative.  Negative for chest pain and leg swelling.  Gastrointestinal: Negative.  Negative for abdominal pain, blood in stool and melena.  Genitourinary: Negative.  Negative for hematuria.  Musculoskeletal: Negative.  Negative for joint pain.  Skin: Negative.  Negative for rash.  Neurological: Negative.  Negative for sensory change, focal weakness and weakness.  Psychiatric/Behavioral: Negative.  The patient is not nervous/anxious and does not have insomnia.     As per HPI. Otherwise, a complete review of systems is negative.  PAST MEDICAL HISTORY: Past Medical History:  Diagnosis Date  . Anemia   . Anginal pain ([place])   . Anxiety   . Arthritis   . CAD (coronary artery disease)   . Carpal  tunnel syndrome    bilateral  . Cataract    bil removed cataracts  . Coronary artery disease    2x stents, Dr. Clayborn Bigness  . Depression   . Dysrhythmia   . Family history of adverse reaction to anesthesia    "brother; S/P lipotripsy in [place]; transferred to [facility]; had to put him on life support for 2 days"  . GERD (gastroesophageal reflux disease)   . Headache    "weekly" (09/13/2014)  . History of blood transfusion 05/2014   "we haven't figured out why I needed it yet" (09/13/2014)  . History of hiatal hernia   . Hyperglycemia   . Hyperlipidemia   . Hypertension   . Myocardial infarct ([place])   . Myocardial infarct, old 1999   15 years ago  . Obesity   . PONV (postoperative nausea and vomiting)   . S/P CABG x 4 09/18/2014   LIMA to LAD, SVG to PDA-dRCA sequentially, SVG to OM, EVH via right thigh   . Sleep apnea   . Sleep apnea    "don't use a mask" (09/13/2014)  . Unstable angina ([place])     PAST SURGICAL HISTORY: Past Surgical History:  Procedure Laterality Date  . ABDOMINAL HYSTERECTOMY    . APPENDECTOMY    . BACK SURGERY    . BLADDER SURGERY    . CARDIAC CATHETERIZATION  06/2014  . CARDIAC CATHETERIZATION N/A 06/16/2016   Procedure: Left Heart Cath and Cors/Grafts Angiography;  Surgeon: Burnell Blanks, MD;  Location: [place] [facility];  Service: Cardiovascular;  Laterality: N/A;  . CARPAL TUNNEL RELEASE Left   . CATARACT EXTRACTION W/ INTRAOCULAR LENS  IMPLANT,  BILATERAL    . CHOLECYSTECTOMY    . COLONOSCOPY    . CORONARY ANGIOPLASTY WITH STENT PLACEMENT  1999   [facility] x2 stent  . CORONARY ARTERY BYPASS GRAFT N/A 09/18/2014   Procedure: CORONARY ARTERY BYPASS GRAFTING (CABG)times four using LIMA  to LAD:SVG to  PD and DIST RCA;SVG to OM;, EVH Right thigh;  Surgeon: [name], MD;  Location: [place];  Service: Open Heart Surgery;  Laterality: N/A;  . ESOPHAGOGASTRODUODENOSCOPY N/A 09/17/2014   Procedure: ESOPHAGOGASTRODUODENOSCOPY (EGD);  Surgeon:  Inda Castle, MD;  Location: [place];  Service: Endoscopy;  Laterality: N/A;  . heart stents     1999  . INCONTINENCE SURGERY    . INGUINAL HERNIA REPAIR    . INGUINAL HERNIA REPAIR Right X 3   "last one was in the 1990's; still have hernia there now" (09/13/2014)  . LAPAROSCOPIC CHOLECYSTECTOMY    . LUMBAR DISC SURGERY  1990's?  . LUMBAR SPINE SURGERY    . removal of ovaries    . TEE WITHOUT CARDIOVERSION N/A 09/18/2014   Procedure: TRANSESOPHAGEAL ECHOCARDIOGRAM (TEE);  Surgeon: [name], MD;  Location: [place];  Service: Open Heart Surgery;  Laterality: N/A;  . UPPER GASTROINTESTINAL ENDOSCOPY      FAMILY HISTORY: Family History  Problem Relation Age of Onset  . Coronary artery disease Mother   . Osteoporosis Mother   . Heart disease Mother   . Stroke Mother   . Heart attack Mother   . Hypertension Mother   . Hypertension Father   . Coronary artery disease Father   . COPD Father   . Heart disease Father   . Heart attack Father   . Glaucoma Father   . Osteoporosis Father   . Emphysema Father   . Cancer Sister 80       colon  . Hypertension Sister   . Colon cancer Sister        dx in her 66's  . Breast cancer Sister   . Stroke Brother   . Hypertension Brother   . Stroke Maternal Grandfather   . Stroke Maternal Grandmother   . Osteoporosis Maternal Grandmother   . Hypertension Maternal Grandmother   . Hypertension Paternal Grandmother   . Hypertension Paternal Grandfather   . Breast cancer Maternal Aunt        2 aunts-50's  . Esophageal cancer Neg Hx   . Stomach cancer Neg Hx   . Rectal cancer Neg Hx   . Pancreatic cancer Neg Hx     ADVANCED DIRECTIVES (Y/N):  N  HEALTH MAINTENANCE: Social History   Tobacco Use  . Smoking status: Never Smoker  . Smokeless tobacco: Never Used  Substance Use Topics  . Alcohol use: Not Currently    Alcohol/week: 0.0 standard drinks    Comment: 09/13/2014 "might have a drink a couple times/yr"  . Drug use: No       Colonoscopy:  PAP:  Bone density:  Lipid panel:  Allergies  Allergen Reactions  . Ferrous Sulfate [Ferrous Fumarate] Other (See Comments)    STOMACH ISSUES  . Lipitor [Atorvastatin] Other (See Comments)    myalgia    Current Outpatient Medications  Medication Sig Dispense Refill  . acetaminophen (TYLENOL) 500 MG tablet Take 500 mg by mouth every 6 (six) hours as needed for headache.    . albuterol (PROVENTIL HFA;VENTOLIN HFA) 108 (90 Base) MCG/ACT inhaler Inhale 2 puffs into the lungs every 6 (six) hours as needed for wheezing or  shortness of breath. (Patient not taking: Reported on 04/28/2017) 1 Inhaler 2  . aspirin EC 81 MG tablet Take 1 tablet (81 mg total) by mouth daily.    . diazepam (VALIUM) 5 MG tablet Take one tablet (5 mg) on arrival for your MRI (Patient not taking: Reported on 01/05/2018) 1 tablet 0  . lisinopril (PRINIVIL,ZESTRIL) 5 MG tablet TAKE 1 TABLET BY MOUTH ONCE DAILY 90 tablet 0  . metoprolol succinate (TOPROL-XL) 50 MG 24 hr tablet TAKE 1 TABLET BY MOUTH ONCE DAILY 30 tablet 3  . pantoprazole (PROTONIX) 40 MG tablet TAKE 1 TABLET BY MOUTH TWICE DAILY 60 tablet 2  . rosuvastatin (CRESTOR) 10 MG tablet TAKE 1 TABLET BY MOUTH ONCE DAILY 30 tablet 2  . sertraline (ZOLOFT) 100 MG tablet TAKE 1 TABLET BY MOUTH ONCE DAILY 90 tablet 0   No current facility-administered medications for this visit.     OBJECTIVE: There were no vitals filed for this visit.   There is no height or weight on file to calculate BMI.    ECOG FS:0 - Asymptomatic  General: Well-developed, well-nourished, no acute distress. Eyes: Pink conjunctiva, anicteric sclera. HEENT: Normocephalic, moist mucous membranes. Lungs: Clear to auscultation bilaterally. Heart: Regular rate and rhythm. No rubs, murmurs, or gallops. Abdomen: Soft, nontender, nondistended. No organomegaly noted, normoactive bowel sounds. Musculoskeletal: No edema, cyanosis, or clubbing. Neuro: Alert, answering all questions  appropriately. Cranial nerves grossly intact. Skin: No rashes or petechiae noted. Psych: Normal affect.  LAB RESULTS:  Lab Results  Component Value Date   NA 140 08/27/2016   K 4.8 08/27/2016   CL 108 08/27/2016   CO2 25 08/27/2016   GLUCOSE 125 (H) 08/27/2016   BUN 20 08/27/2016   CREATININE 0.88 08/27/2016   CALCIUM 9.0 08/27/2016   PROT 6.7 08/27/2016   ALBUMIN 4.1 08/27/2016   AST 11 08/27/2016   ALT 9 08/27/2016   ALKPHOS 63 08/27/2016   BILITOT 0.4 08/27/2016   GFRNONAA 57 (L) 06/17/2016   GFRAA >60 06/17/2016    Lab Results  Component Value Date   WBC 6.7 12/28/2017   NEUTROABS 3.8 12/28/2017   HGB 13.9 12/28/2017   HCT 41.7 12/28/2017   MCV 91.6 12/28/2017   PLT 224 12/28/2017   Lab Results  Component Value Date   IRON 46 12/28/2017   TIBC 404 12/28/2017   IRONPCTSAT 11 12/28/2017   Lab Results  Component Value Date   FERRITIN 18 12/28/2017     STUDIES: No results found.  ASSESSMENT: Iron deficiency anemia.  PLAN:    1. Iron deficiency anemia: Patient's hemoglobin and iron stores continue to be within normal limits.  Previously, she reported she cannot tolerate oral iron supplementation. The remainder of her laboratory work is either negative or within normal limits.  Patient had a normal endoscopy in May 2017 and a normal colonoscopy in August 2014.  She does not require additional IV Feraheme today.  Patient last received treatment on May 05, 2017.  No intervention is needed at this time.  Return to clinic in 6 months with repeat laboratory work and further evaluation.  If patient's hemoglobin and iron stores continue to be within normal limits at that time, she likely can be discharged from clinic. 2.  Hypertension: Patient's blood pressure significantly elevated today.  Continue current medications as prescribed and follow-up with PCP for further management.   Patient expressed understanding and was in agreement with this plan. She also  understands that She can call clinic at  any time with any questions, concerns, or complaints.    Lloyd Huger, MD   07/17/2018 11:36 PM

## 2018-07-18 ENCOUNTER — Encounter: Payer: Self-pay | Admitting: Gastroenterology

## 2018-07-21 ENCOUNTER — Inpatient Hospital Stay: Payer: Medicare Other

## 2018-07-21 ENCOUNTER — Inpatient Hospital Stay: Payer: Medicare Other | Admitting: Oncology

## 2018-07-31 NOTE — Progress Notes (Deleted)
[place]  Telephone:(336) [phone] Fax:(336) [phone]  ID: [name] OB: 1946/03/23  MR#: [id number]  EHU#:[id number]  Patient Care Team: [name], MD as PCP - General (Family Medicine) [name], MD (Internal Medicine) [name], MD as Consulting Physician (Cardiothoracic Surgery) [name], MD as Consulting Physician (Cardiology)  CHIEF COMPLAINT: Iron deficiency anemia.  INTERVAL HISTORY: Patient returns to clinic today for repeat laboratory work and further evaluation.  She currently feels well and is asymptomatic.  She does not complain of weakness or fatigue today.  She has no neurologic complaints. She denies any recent fevers or illnesses. She has a good appetite and denies weight loss. She has no chest pain, cough, or shortness of breath. She denies any nausea, vomiting, constipation, or diarrhea. She denies any melena or hematochezia. She has no urinary complaints.  Patient offers no specific complaints today.  REVIEW OF SYSTEMS:   Review of Systems  Constitutional: Negative.  Negative for fever, malaise/fatigue and weight loss.  Respiratory: Negative.  Negative for cough and shortness of breath.   Cardiovascular: Negative.  Negative for chest pain and leg swelling.  Gastrointestinal: Negative.  Negative for abdominal pain, blood in stool and melena.  Genitourinary: Negative.  Negative for hematuria.  Musculoskeletal: Negative.  Negative for joint pain.  Skin: Negative.  Negative for rash.  Neurological: Negative.  Negative for sensory change, focal weakness and weakness.  Psychiatric/Behavioral: Negative.  The patient is not nervous/anxious and does not have insomnia.     As per HPI. Otherwise, a complete review of systems is negative.  PAST MEDICAL HISTORY: Past Medical History:  Diagnosis Date  . Anemia   . Anginal pain ([place])   . Anxiety   . Arthritis   . CAD (coronary artery disease)   . Carpal  tunnel syndrome    bilateral  . Cataract    bil removed cataracts  . Coronary artery disease    2x stents, Dr. Clayborn Bigness  . Depression   . Dysrhythmia   . Family history of adverse reaction to anesthesia    "brother; S/P lipotripsy in [place]; transferred to [facility]; had to put him on life support for 2 days"  . GERD (gastroesophageal reflux disease)   . Headache    "weekly" (09/13/2014)  . History of blood transfusion 05/2014   "we haven't figured out why I needed it yet" (09/13/2014)  . History of hiatal hernia   . Hyperglycemia   . Hyperlipidemia   . Hypertension   . Myocardial infarct ([place])   . Myocardial infarct, old 1999   15 years ago  . Obesity   . PONV (postoperative nausea and vomiting)   . S/P CABG x 4 09/18/2014   LIMA to LAD, SVG to PDA-dRCA sequentially, SVG to OM, EVH via right thigh   . Sleep apnea   . Sleep apnea    "don't use a mask" (09/13/2014)  . Unstable angina ([place])     PAST SURGICAL HISTORY: Past Surgical History:  Procedure Laterality Date  . ABDOMINAL HYSTERECTOMY    . APPENDECTOMY    . BACK SURGERY    . BLADDER SURGERY    . CARDIAC CATHETERIZATION  06/2014  . CARDIAC CATHETERIZATION N/A 06/16/2016   Procedure: Left Heart Cath and Cors/Grafts Angiography;  Surgeon: Burnell Blanks, MD;  Location: [place] [facility];  Service: Cardiovascular;  Laterality: N/A;  . CARPAL TUNNEL RELEASE Left   . CATARACT EXTRACTION W/ INTRAOCULAR LENS  IMPLANT,  BILATERAL    . CHOLECYSTECTOMY    . COLONOSCOPY    . CORONARY ANGIOPLASTY WITH STENT PLACEMENT  1999   [facility] x2 stent  . CORONARY ARTERY BYPASS GRAFT N/A 09/18/2014   Procedure: CORONARY ARTERY BYPASS GRAFTING (CABG)times four using LIMA  to LAD:SVG to  PD and DIST RCA;SVG to OM;, EVH Right thigh;  Surgeon: [name], MD;  Location: [place];  Service: Open Heart Surgery;  Laterality: N/A;  . ESOPHAGOGASTRODUODENOSCOPY N/A 09/17/2014   Procedure: ESOPHAGOGASTRODUODENOSCOPY (EGD);  Surgeon:  Inda Castle, MD;  Location: [place];  Service: Endoscopy;  Laterality: N/A;  . heart stents     1999  . INCONTINENCE SURGERY    . INGUINAL HERNIA REPAIR    . INGUINAL HERNIA REPAIR Right X 3   "last one was in the 1990's; still have hernia there now" (09/13/2014)  . LAPAROSCOPIC CHOLECYSTECTOMY    . LUMBAR DISC SURGERY  1990's?  . LUMBAR SPINE SURGERY    . removal of ovaries    . TEE WITHOUT CARDIOVERSION N/A 09/18/2014   Procedure: TRANSESOPHAGEAL ECHOCARDIOGRAM (TEE);  Surgeon: [name], MD;  Location: [place];  Service: Open Heart Surgery;  Laterality: N/A;  . UPPER GASTROINTESTINAL ENDOSCOPY      FAMILY HISTORY: Family History  Problem Relation Age of Onset  . Coronary artery disease Mother   . Osteoporosis Mother   . Heart disease Mother   . Stroke Mother   . Heart attack Mother   . Hypertension Mother   . Hypertension Father   . Coronary artery disease Father   . COPD Father   . Heart disease Father   . Heart attack Father   . Glaucoma Father   . Osteoporosis Father   . Emphysema Father   . Cancer Sister 53       colon  . Hypertension Sister   . Colon cancer Sister        dx in her 102's  . Breast cancer Sister   . Stroke Brother   . Hypertension Brother   . Stroke Maternal Grandfather   . Stroke Maternal Grandmother   . Osteoporosis Maternal Grandmother   . Hypertension Maternal Grandmother   . Hypertension Paternal Grandmother   . Hypertension Paternal Grandfather   . Breast cancer Maternal Aunt        2 aunts-50's  . Esophageal cancer Neg Hx   . Stomach cancer Neg Hx   . Rectal cancer Neg Hx   . Pancreatic cancer Neg Hx     ADVANCED DIRECTIVES (Y/N):  N  HEALTH MAINTENANCE: Social History   Tobacco Use  . Smoking status: Never Smoker  . Smokeless tobacco: Never Used  Substance Use Topics  . Alcohol use: Not Currently    Alcohol/week: 0.0 standard drinks    Comment: 09/13/2014 "might have a drink a couple times/yr"  . Drug use: No       Colonoscopy:  PAP:  Bone density:  Lipid panel:  Allergies  Allergen Reactions  . Ferrous Sulfate [Ferrous Fumarate] Other (See Comments)    STOMACH ISSUES  . Lipitor [Atorvastatin] Other (See Comments)    myalgia    Current Outpatient Medications  Medication Sig Dispense Refill  . acetaminophen (TYLENOL) 500 MG tablet Take 500 mg by mouth every 6 (six) hours as needed for headache.    . albuterol (PROVENTIL HFA;VENTOLIN HFA) 108 (90 Base) MCG/ACT inhaler Inhale 2 puffs into the lungs every 6 (six) hours as needed for wheezing or  shortness of breath. (Patient not taking: Reported on 04/28/2017) 1 Inhaler 2  . aspirin EC 81 MG tablet Take 1 tablet (81 mg total) by mouth daily.    . diazepam (VALIUM) 5 MG tablet Take one tablet (5 mg) on arrival for your MRI (Patient not taking: Reported on 01/05/2018) 1 tablet 0  . lisinopril (PRINIVIL,ZESTRIL) 5 MG tablet TAKE 1 TABLET BY MOUTH ONCE DAILY 90 tablet 0  . metoprolol succinate (TOPROL-XL) 50 MG 24 hr tablet TAKE 1 TABLET BY MOUTH ONCE DAILY 30 tablet 3  . pantoprazole (PROTONIX) 40 MG tablet TAKE 1 TABLET BY MOUTH TWICE DAILY 60 tablet 2  . rosuvastatin (CRESTOR) 10 MG tablet TAKE 1 TABLET BY MOUTH ONCE DAILY 30 tablet 2  . sertraline (ZOLOFT) 100 MG tablet TAKE 1 TABLET BY MOUTH ONCE DAILY 90 tablet 0   No current facility-administered medications for this visit.     OBJECTIVE: There were no vitals filed for this visit.   There is no height or weight on file to calculate BMI.    ECOG FS:0 - Asymptomatic  General: Well-developed, well-nourished, no acute distress. Eyes: Pink conjunctiva, anicteric sclera. HEENT: Normocephalic, moist mucous membranes. Lungs: Clear to auscultation bilaterally. Heart: Regular rate and rhythm. No rubs, murmurs, or gallops. Abdomen: Soft, nontender, nondistended. No organomegaly noted, normoactive bowel sounds. Musculoskeletal: No edema, cyanosis, or clubbing. Neuro: Alert, answering all questions  appropriately. Cranial nerves grossly intact. Skin: No rashes or petechiae noted. Psych: Normal affect.  LAB RESULTS:  Lab Results  Component Value Date   NA 140 08/27/2016   K 4.8 08/27/2016   CL 108 08/27/2016   CO2 25 08/27/2016   GLUCOSE 125 (H) 08/27/2016   BUN 20 08/27/2016   CREATININE 0.88 08/27/2016   CALCIUM 9.0 08/27/2016   PROT 6.7 08/27/2016   ALBUMIN 4.1 08/27/2016   AST 11 08/27/2016   ALT 9 08/27/2016   ALKPHOS 63 08/27/2016   BILITOT 0.4 08/27/2016   GFRNONAA 57 (L) 06/17/2016   GFRAA >60 06/17/2016    Lab Results  Component Value Date   WBC 6.7 12/28/2017   NEUTROABS 3.8 12/28/2017   HGB 13.9 12/28/2017   HCT 41.7 12/28/2017   MCV 91.6 12/28/2017   PLT 224 12/28/2017   Lab Results  Component Value Date   IRON 46 12/28/2017   TIBC 404 12/28/2017   IRONPCTSAT 11 12/28/2017   Lab Results  Component Value Date   FERRITIN 18 12/28/2017     STUDIES: No results found.  ASSESSMENT: Iron deficiency anemia.  PLAN:    1. Iron deficiency anemia: Patient's hemoglobin and iron stores continue to be within normal limits.  Previously, she reported she cannot tolerate oral iron supplementation. The remainder of her laboratory work is either negative or within normal limits.  Patient had a normal endoscopy in May 2017 and a normal colonoscopy in August 2014.  She does not require additional IV Feraheme today.  Patient last received treatment on May 05, 2017.  No intervention is needed at this time.  Return to clinic in 6 months with repeat laboratory work and further evaluation.  If patient's hemoglobin and iron stores continue to be within normal limits at that time, she likely can be discharged from clinic. 2.  Hypertension: Patient's blood pressure significantly elevated today.  Continue current medications as prescribed and follow-up with PCP for further management.   Patient expressed understanding and was in agreement with this plan. She also  understands that She can call clinic at  any time with any questions, concerns, or complaints.    Lloyd Huger, MD   07/31/2018 5:46 PM

## 2018-08-03 ENCOUNTER — Other Ambulatory Visit: Payer: Self-pay | Admitting: *Deleted

## 2018-08-03 DIAGNOSIS — D509 Iron deficiency anemia, unspecified: Secondary | ICD-10-CM

## 2018-08-05 ENCOUNTER — Inpatient Hospital Stay: Payer: Medicare Other

## 2018-08-05 ENCOUNTER — Inpatient Hospital Stay: Payer: Medicare Other | Admitting: Oncology

## 2018-08-10 NOTE — Progress Notes (Deleted)
[place]  [phone] [phone]  ID: Debbie Delacruz OB: Nov 02, 1945  MR#: [id number]  EGB#:[id number]  Patient Care Team: [name], MD as PCP - General (Family Medicine) [name], MD (Internal Medicine) [name], MD as Consulting Physician (Cardiothoracic Surgery) [name], MD as Consulting Physician (Cardiology)  CHIEF COMPLAINT: Iron deficiency anemia.  INTERVAL HISTORY: Patient returns to clinic today for repeat laboratory work and further evaluation.  She currently feels well and is asymptomatic.  She does not complain of weakness or fatigue today.  She has no neurologic complaints. She denies any recent fevers or illnesses. She has a good appetite and denies weight loss. She has no chest pain, cough, or shortness of breath. She denies any nausea, vomiting, constipation, or diarrhea. She denies any melena or hematochezia. She has no urinary complaints.  Patient offers no specific complaints today.  REVIEW OF SYSTEMS:   Review of Systems  Constitutional: Negative.  Negative for fever, malaise/fatigue and weight loss.  Respiratory: Negative.  Negative for cough and shortness of breath.   Cardiovascular: Negative.  Negative for chest pain and leg swelling.  Gastrointestinal: Negative.  Negative for abdominal pain, blood in stool and melena.  Genitourinary: Negative.  Negative for hematuria.  Musculoskeletal: Negative.  Negative for joint pain.  Skin: Negative.  Negative for rash.  Neurological: Negative.  Negative for sensory change, focal weakness and weakness.  Psychiatric/Behavioral: Negative.  The patient is not nervous/anxious and does not have insomnia.     As per HPI. Otherwise, a complete review of systems is negative.  PAST MEDICAL HISTORY: Past Medical History:  Diagnosis Date  . Anemia   . Anginal pain ([place])   . Anxiety   . Arthritis   . CAD (coronary artery disease)   . Carpal  tunnel syndrome    bilateral  . Cataract    bil removed cataracts  . Coronary artery disease    2x stents, Dr. Clayborn Bigness  . Depression   . Dysrhythmia   . Family history of adverse reaction to anesthesia    "brother; S/P lipotripsy in [place]; transferred to [facility]; had to put him on life support for 2 days"  . GERD (gastroesophageal reflux disease)   . Headache    "weekly" (09/13/2014)  . History of blood transfusion 05/2014   "we haven't figured out why I needed it yet" (09/13/2014)  . History of hiatal hernia   . Hyperglycemia   . Hyperlipidemia   . Hypertension   . Myocardial infarct ([place])   . Myocardial infarct, old 1999   15 years ago  . Obesity   . PONV (postoperative nausea and vomiting)   . S/P CABG x 4 09/18/2014   LIMA to LAD, SVG to PDA-dRCA sequentially, SVG to OM, EVH via right thigh   . Sleep apnea   . Sleep apnea    "don't use a mask" (09/13/2014)  . Unstable angina ([place])     PAST SURGICAL HISTORY: Past Surgical History:  Procedure Laterality Date  . ABDOMINAL HYSTERECTOMY    . APPENDECTOMY    . BACK SURGERY    . BLADDER SURGERY    . CARDIAC CATHETERIZATION  06/2014  . CARDIAC CATHETERIZATION N/A 06/16/2016   Procedure: Left Heart Cath and Cors/Grafts Angiography;  Surgeon: Burnell Blanks, MD;  Location: [place] [facility];  Service: Cardiovascular;  Laterality: N/A;  . CARPAL TUNNEL RELEASE Left   . CATARACT EXTRACTION W/ INTRAOCULAR LENS  IMPLANT,  BILATERAL    . CHOLECYSTECTOMY    . COLONOSCOPY    . CORONARY ANGIOPLASTY WITH STENT PLACEMENT  1999   [facility] x2 stent  . CORONARY ARTERY BYPASS GRAFT N/A 09/18/2014   Procedure: CORONARY ARTERY BYPASS GRAFTING (CABG)times four using LIMA  to LAD:SVG to  PD and DIST RCA;SVG to OM;, EVH Right thigh;  Surgeon: [name], MD;  Location: [place];  Service: Open Heart Surgery;  Laterality: N/A;  . ESOPHAGOGASTRODUODENOSCOPY N/A 09/17/2014   Procedure: ESOPHAGOGASTRODUODENOSCOPY (EGD);  Surgeon:  Inda Castle, MD;  Location: [place];  Service: Endoscopy;  Laterality: N/A;  . heart stents     1999  . INCONTINENCE SURGERY    . INGUINAL HERNIA REPAIR    . INGUINAL HERNIA REPAIR Right X 3   "last one was in the 1990's; still have hernia there now" (09/13/2014)  . LAPAROSCOPIC CHOLECYSTECTOMY    . LUMBAR DISC SURGERY  1990's?  . LUMBAR SPINE SURGERY    . removal of ovaries    . TEE WITHOUT CARDIOVERSION N/A 09/18/2014   Procedure: TRANSESOPHAGEAL ECHOCARDIOGRAM (TEE);  Surgeon: [name], MD;  Location: [place];  Service: Open Heart Surgery;  Laterality: N/A;  . UPPER GASTROINTESTINAL ENDOSCOPY      FAMILY HISTORY: Family History  Problem Relation Age of Onset  . Coronary artery disease Mother   . Osteoporosis Mother   . Heart disease Mother   . Stroke Mother   . Heart attack Mother   . Hypertension Mother   . Hypertension Father   . Coronary artery disease Father   . COPD Father   . Heart disease Father   . Heart attack Father   . Glaucoma Father   . Osteoporosis Father   . Emphysema Father   . Cancer Sister 35       colon  . Hypertension Sister   . Colon cancer Sister        dx in her 13's  . Breast cancer Sister   . Stroke Brother   . Hypertension Brother   . Stroke Maternal Grandfather   . Stroke Maternal Grandmother   . Osteoporosis Maternal Grandmother   . Hypertension Maternal Grandmother   . Hypertension Paternal Grandmother   . Hypertension Paternal Grandfather   . Breast cancer Maternal Aunt        2 aunts-50's  . Esophageal cancer Neg Hx   . Stomach cancer Neg Hx   . Rectal cancer Neg Hx   . Pancreatic cancer Neg Hx     ADVANCED DIRECTIVES (Y/N):  N  HEALTH MAINTENANCE: Social History   Tobacco Use  . Smoking status: Never Smoker  . Smokeless tobacco: Never Used  Substance Use Topics  . Alcohol use: Not Currently    Alcohol/week: 0.0 standard drinks    Comment: 09/13/2014 "might have a drink a couple times/yr"  . Drug use: No      Colonoscopy:  PAP:  Bone density:  Lipid panel:  Allergies  Allergen Reactions  . Ferrous Sulfate [Ferrous Fumarate] Other (See Comments)    STOMACH ISSUES  . Lipitor [Atorvastatin] Other (See Comments)    myalgia    Current Outpatient Medications  Medication Sig Dispense Refill  . acetaminophen (TYLENOL) 500 MG tablet Take 500 mg by mouth every 6 (six) hours as needed for headache.    . albuterol (PROVENTIL HFA;VENTOLIN HFA) 108 (90 Base) MCG/ACT inhaler Inhale 2 puffs into the lungs every 6 (six) hours as needed for wheezing or shortness  of breath. (Patient not taking: Reported on 04/28/2017) 1 Inhaler 2  . aspirin EC 81 MG tablet Take 1 tablet (81 mg total) by mouth daily.    . diazepam (VALIUM) 5 MG tablet Take one tablet (5 mg) on arrival for your MRI (Patient not taking: Reported on 01/05/2018) 1 tablet 0  . lisinopril (PRINIVIL,ZESTRIL) 5 MG tablet TAKE 1 TABLET BY MOUTH ONCE DAILY 90 tablet 0  . metoprolol succinate (TOPROL-XL) 50 MG 24 hr tablet TAKE 1 TABLET BY MOUTH ONCE DAILY 30 tablet 3  . pantoprazole (PROTONIX) 40 MG tablet TAKE 1 TABLET BY MOUTH TWICE DAILY 60 tablet 2  . rosuvastatin (CRESTOR) 10 MG tablet TAKE 1 TABLET BY MOUTH ONCE DAILY 30 tablet 2  . sertraline (ZOLOFT) 100 MG tablet TAKE 1 TABLET BY MOUTH ONCE DAILY 90 tablet 0   No current facility-administered medications for this visit.     OBJECTIVE: There were no vitals filed for this visit.   There is no height or weight on file to calculate BMI.    ECOG FS:0 - Asymptomatic  General: Well-developed, well-nourished, no acute distress. Eyes: Pink conjunctiva, anicteric sclera. HEENT: Normocephalic, moist mucous membranes. Lungs: Clear to auscultation bilaterally. Heart: Regular rate and rhythm. No rubs, murmurs, or gallops. Abdomen: Soft, nontender, nondistended. No organomegaly noted, normoactive bowel sounds. Musculoskeletal: No edema, cyanosis, or clubbing. Neuro: Alert, answering all questions  appropriately. Cranial nerves grossly intact. Skin: No rashes or petechiae noted. Psych: Normal affect.  LAB RESULTS:  Lab Results  Component Value Date   NA 140 08/27/2016   K 4.8 08/27/2016   CL 108 08/27/2016   CO2 25 08/27/2016   GLUCOSE 125 (H) 08/27/2016   BUN 20 08/27/2016   CREATININE 0.88 08/27/2016   CALCIUM 9.0 08/27/2016   PROT 6.7 08/27/2016   ALBUMIN 4.1 08/27/2016   AST 11 08/27/2016   ALT 9 08/27/2016   ALKPHOS 63 08/27/2016   BILITOT 0.4 08/27/2016   GFRNONAA 57 (L) 06/17/2016   GFRAA >60 06/17/2016    Lab Results  Component Value Date   WBC 6.7 12/28/2017   NEUTROABS 3.8 12/28/2017   HGB 13.9 12/28/2017   HCT 41.7 12/28/2017   MCV 91.6 12/28/2017   PLT 224 12/28/2017   Lab Results  Component Value Date   IRON 46 12/28/2017   TIBC 404 12/28/2017   IRONPCTSAT 11 12/28/2017   Lab Results  Component Value Date   FERRITIN 18 12/28/2017     STUDIES: No results found.  ASSESSMENT: Iron deficiency anemia.  PLAN:    1. Iron deficiency anemia: Patient's hemoglobin and iron stores continue to be within normal limits.  Previously, she reported she cannot tolerate oral iron supplementation. The remainder of her laboratory work is either negative or within normal limits.  Patient had a normal endoscopy in May 2017 and a normal colonoscopy in August 2014.  She does not require additional IV Feraheme today.  Patient last received treatment on May 05, 2017.  No intervention is needed at this time.  Return to clinic in 6 months with repeat laboratory work and further evaluation.  If patient's hemoglobin and iron stores continue to be within normal limits at that time, she likely can be discharged from clinic. 2.  Hypertension: Patient's blood pressure significantly elevated today.  Continue current medications as prescribed and follow-up with PCP for further management.   Patient expressed understanding and was in agreement with this plan. She also  understands that She can call clinic at any  time with any questions, concerns, or complaints.    Lloyd Huger, MD   08/10/2018 2:37 PM

## 2018-08-11 ENCOUNTER — Other Ambulatory Visit: Payer: Self-pay

## 2018-08-12 ENCOUNTER — Inpatient Hospital Stay: Payer: Medicare Other

## 2018-08-12 ENCOUNTER — Encounter: Payer: Self-pay | Admitting: Gastroenterology

## 2018-08-12 ENCOUNTER — Inpatient Hospital Stay: Payer: Medicare Other | Admitting: Oncology

## 2018-08-16 ENCOUNTER — Other Ambulatory Visit: Payer: Self-pay

## 2018-08-16 ENCOUNTER — Encounter: Payer: Self-pay | Admitting: Emergency Medicine

## 2018-08-16 ENCOUNTER — Emergency Department: Payer: Medicare Other

## 2018-08-16 ENCOUNTER — Emergency Department
Admission: EM | Admit: 2018-08-16 | Discharge: 2018-08-16 | Disposition: A | Discharge status: Home / Self Care | Payer: Medicare Other | Attending: Emergency Medicine | Admitting: Emergency Medicine

## 2018-08-16 DIAGNOSIS — I2510 Atherosclerotic heart disease of native coronary artery without angina pectoris: Secondary | ICD-10-CM | POA: Insufficient documentation

## 2018-08-16 DIAGNOSIS — R079 Chest pain, unspecified: Secondary | ICD-10-CM | POA: Insufficient documentation

## 2018-08-16 DIAGNOSIS — R0789 Other chest pain: Secondary | ICD-10-CM | POA: Diagnosis not present

## 2018-08-16 DIAGNOSIS — Z79899 Other long term (current) drug therapy: Secondary | ICD-10-CM | POA: Diagnosis not present

## 2018-08-16 DIAGNOSIS — I252 Old myocardial infarction: Secondary | ICD-10-CM | POA: Insufficient documentation

## 2018-08-16 DIAGNOSIS — I10 Essential (primary) hypertension: Secondary | ICD-10-CM | POA: Diagnosis not present

## 2018-08-16 DIAGNOSIS — R110 Nausea: Secondary | ICD-10-CM | POA: Diagnosis not present

## 2018-08-16 DIAGNOSIS — Z7982 Long term (current) use of aspirin: Secondary | ICD-10-CM | POA: Diagnosis not present

## 2018-08-16 LAB — COMPREHENSIVE METABOLIC PANEL
ALT: 13 U/L (ref 0–44)
AST: 22 U/L (ref 15–41)
Albumin: 3.6 g/dL (ref 3.5–5.0)
Alkaline Phosphatase: 47 U/L (ref 38–126)
Anion gap: 8 (ref 5–15)
BUN: 25 mg/dL — ABNORMAL HIGH (ref 8–23)
CO2: 21 mmol/L — ABNORMAL LOW (ref 22–32)
Calcium: 8.4 mg/dL — ABNORMAL LOW (ref 8.9–10.3)
Chloride: 110 mmol/L (ref 98–111)
Creatinine, Ser: 0.92 mg/dL (ref 0.44–1.00)
GFR calc Af Amer: >60 mL/min (ref >60)
GFR calc non Af Amer: >60 mL/min (ref >60)
Glucose, Bld: 138 mg/dL — ABNORMAL HIGH (ref 70–99)
Potassium: 4.8 mmol/L (ref 3.5–5.1)
Sodium: 139 mmol/L (ref 135–145)
Total Bilirubin: 0.7 mg/dL (ref 0.3–1.2)
Total Protein: 6.6 g/dL (ref 6.5–8.1)

## 2018-08-16 LAB — CBC WITH DIFFERENTIAL/PLATELET
Abs Immature Granulocytes: 0.02 10*3/uL (ref 0.00–0.07)
BASOS ABS: 0 10*3/uL (ref 0.0–0.1)
Basophils Relative: 0 %
EOS PCT: 4 %
Eosinophils Absolute: 0.3 10*3/uL (ref 0.0–0.5)
HCT: 34.8 % — ABNORMAL LOW (ref 36.0–46.0)
Hemoglobin: 10.5 g/dL — ABNORMAL LOW (ref 12.0–15.0)
Immature Granulocytes: 0 %
Lymphocytes Relative: 27 %
Lymphs Abs: 1.6 10*3/uL (ref 0.7–4.0)
MCH: 26.3 pg (ref 26.0–34.0)
MCHC: 30.2 g/dL (ref 30.0–36.0)
MCV: 87.2 fL (ref 80.0–100.0)
Monocytes Absolute: 0.7 10*3/uL (ref 0.1–1.0)
Monocytes Relative: 11 %
NRBC: 0 % (ref 0.0–0.2)
Neutro Abs: 3.5 10*3/uL (ref 1.7–7.7)
Neutrophils Relative %: 58 %
Platelets: 249 10*3/uL (ref 150–400)
RBC: 3.99 MIL/uL (ref 3.87–5.11)
RDW: 15.3 % (ref 11.5–15.5)
WBC: 6.1 10*3/uL (ref 4.0–10.5)

## 2018-08-16 LAB — TROPONIN I
Troponin I: <0.03 ng/mL (ref <0.03)
Troponin I: <0.03 ng/mL (ref <0.03)

## 2018-08-16 NOTE — ED Provider Notes (Signed)
Patient does not have any chest pain at this time, is chest pain-free with 2- troponins.  She is cleared for outpatient follow-up as needed.   Earleen Newport, MD 08/16/18 [phone]

## 2018-08-16 NOTE — ED Provider Notes (Signed)
[facility] Emergency Department Provider Note   ____________________________________________   First MD Initiated Contact with Patient 08/16/18 [phone]     (approximate)  I have reviewed the triage vital signs and the nursing notes.   HISTORY  Chief Complaint Chest Pain    HPI Mirakle Tomlin is a 73 y.o. female patient reports chest pain under her left breast starting about 130 lasted for several minutes she took her Imdur and it went away then came back about 230 to 3:00 and lasted until she got here at 5.  Patient reported some nausea and clamminess with it but not shortness of breath.  She did not vomit.  Pain is not worse with deep breathing.  Pain seem achy and tight.  This was worse than when she had her heart attack.  Patient reports she gets iron infusions for anemia.         Past Medical History:  Diagnosis Date  . Anemia   . Anginal pain ([place])   . Anxiety   . Arthritis   . CAD (coronary artery disease)   . Carpal tunnel syndrome    bilateral  . Cataract    bil removed cataracts  . Coronary artery disease    2x stents, Dr. Clayborn Bigness  . Depression   . Dysrhythmia   . Family history of adverse reaction to anesthesia    "brother; S/P lipotripsy in [place]; transferred to [place]; had to put him on life support for 2 days"  . GERD (gastroesophageal reflux disease)   . Headache    "weekly" (09/13/2014)  . History of blood transfusion 05/2014   "we haven't figured out why I needed it yet" (09/13/2014)  . History of hiatal hernia   . Hyperglycemia   . Hyperlipidemia   . Hypertension   . Myocardial infarct ([place])   . Myocardial infarct, old 1999   15 years ago  . Obesity   . PONV (postoperative nausea and vomiting)   . S/P CABG x 4 09/18/2014   LIMA to LAD, SVG to PDA-dRCA sequentially, SVG to OM, EVH via right thigh   . Sleep apnea   . Sleep apnea    "don't use a mask" (09/13/2014)  . Unstable angina [facility])     Patient  Active Problem List   Diagnosis Date Noted  . Formication 08/27/2016  . NSTEMI (non-ST elevated myocardial infarction) ([place]) 06/16/2016  . Syncope 06/15/2016  . AKI (acute kidney injury) ([place]) 06/15/2016  . Bronchitis 05/27/2016  . Breast lump 11/22/2015  . Contact with and (suspected) exposure to other bacterial communicable diseases 10/09/2015  . Anxiety and depression 10/09/2015  . S/P CABG x 4 09/18/2014  . Barrett's esophagus 09/17/2014  . Duodenitis 09/17/2014  . History of gastritis   . GERD (gastroesophageal reflux disease)   . CAD (coronary artery disease)   . Medicare annual wellness visit, initial 03/28/2013  . Essential hypertension, benign 11/16/2012  . Iron deficiency anemia 11/16/2012  . Elevated blood sugar 11/16/2012  . Arthralgia 11/16/2012  . Screening for breast cancer 11/16/2012  . Special screening for malignant neoplasms, colon 11/16/2012  . Coronary artery disease 11/16/2012  . Dyslipidemia 09/15/2008  . COLONIC POLYPS, ADENOMATOUS, HX OF 02/20/2008    Past Surgical History:  Procedure Laterality Date  . ABDOMINAL HYSTERECTOMY    . APPENDECTOMY    . BACK SURGERY    . BLADDER SURGERY    . CARDIAC CATHETERIZATION  06/2014  . CARDIAC CATHETERIZATION N/A 06/16/2016  Procedure: Left Heart Cath and Cors/Grafts Angiography;  Surgeon: Burnell Blanks, MD;  Location: [place] [facility];  Service: Cardiovascular;  Laterality: N/A;  . CARPAL TUNNEL RELEASE Left   . CATARACT EXTRACTION W/ INTRAOCULAR LENS  IMPLANT, BILATERAL    . CHOLECYSTECTOMY    . COLONOSCOPY    . CORONARY ANGIOPLASTY WITH STENT PLACEMENT  1999   [facility] x2 stent  . CORONARY ARTERY BYPASS GRAFT N/A 09/18/2014   Procedure: CORONARY ARTERY BYPASS GRAFTING (CABG)times four using LIMA  to LAD:SVG to  PD and DIST RCA;SVG to OM;, EVH Right thigh;  Surgeon: [name], MD;  Location: [place];  Service: Open Heart Surgery;  Laterality: N/A;  . ESOPHAGOGASTRODUODENOSCOPY N/A 09/17/2014    Procedure: ESOPHAGOGASTRODUODENOSCOPY (EGD);  Surgeon: Inda Castle, MD;  Location: [place];  Service: Endoscopy;  Laterality: N/A;  . heart stents     1999  . INCONTINENCE SURGERY    . INGUINAL HERNIA REPAIR    . INGUINAL HERNIA REPAIR Right X 3   "last one was in the 1990's; still have hernia there now" (09/13/2014)  . LAPAROSCOPIC CHOLECYSTECTOMY    . LUMBAR DISC SURGERY  1990's?  . LUMBAR SPINE SURGERY    . removal of ovaries    . TEE WITHOUT CARDIOVERSION N/A 09/18/2014   Procedure: TRANSESOPHAGEAL ECHOCARDIOGRAM (TEE);  Surgeon: [name], MD;  Location: [place];  Service: Open Heart Surgery;  Laterality: N/A;  . UPPER GASTROINTESTINAL ENDOSCOPY      Prior to Admission medications   Medication Sig Start Date End Date Taking? Authorizing Provider  acetaminophen (TYLENOL) 500 MG tablet Take 500 mg by mouth every 6 (six) hours as needed for headache.    [provider]  albuterol (PROVENTIL HFA;VENTOLIN HFA) 108 (90 Base) MCG/ACT inhaler Inhale 2 puffs into the lungs every 6 (six) hours as needed for wheezing or shortness of breath. Patient not taking: Reported on 04/28/2017 05/30/16   Merlyn Lot, MD  aspirin EC 81 MG tablet Take 1 tablet (81 mg total) by mouth daily. 07/09/16   Deboraha Sprang, MD  diazepam (VALIUM) 5 MG tablet Take one tablet (5 mg) on arrival for your MRI Patient not taking: Reported on 01/05/2018 07/09/16   Deboraha Sprang, MD  lisinopril (PRINIVIL,ZESTRIL) 5 MG tablet TAKE 1 TABLET BY MOUTH ONCE DAILY 04/11/18   Deboraha Sprang, MD  metoprolol succinate (TOPROL-XL) 50 MG 24 hr tablet TAKE 1 TABLET BY MOUTH ONCE DAILY 08/30/17   [name], MD  pantoprazole (PROTONIX) 40 MG tablet TAKE 1 TABLET BY MOUTH TWICE DAILY 08/30/17   [name], MD  rosuvastatin (CRESTOR) 10 MG tablet TAKE 1 TABLET BY MOUTH ONCE DAILY 04/12/18   [name], MD  sertraline (ZOLOFT) 100 MG tablet TAKE 1 TABLET BY MOUTH ONCE DAILY 04/12/18   [name], MD    Allergies Ferrous sulfate [ferrous fumarate] and Lipitor [atorvastatin]  Family History  Problem Relation Age of Onset  . Coronary artery disease Mother   . Osteoporosis Mother   . Heart disease Mother   . Stroke Mother   . Heart attack Mother   . Hypertension Mother   . Hypertension Father   . Coronary artery disease Father   . COPD Father   . Heart disease Father   . Heart attack Father   . Glaucoma Father   . Osteoporosis Father   . Emphysema Father   . Cancer Sister 46  colon  . Hypertension Sister   . Colon cancer Sister        dx in her 54's  . Breast cancer Sister   . Stroke Brother   . Hypertension Brother   . Stroke Maternal Grandfather   . Stroke Maternal Grandmother   . Osteoporosis Maternal Grandmother   . Hypertension Maternal Grandmother   . Hypertension Paternal Grandmother   . Hypertension Paternal Grandfather   . Breast cancer Maternal Aunt        2 aunts-50's  . Esophageal cancer Neg Hx   . Stomach cancer Neg Hx   . Rectal cancer Neg Hx   . Pancreatic cancer Neg Hx     Social History Social History   Tobacco Use  . Smoking status: Never Smoker  . Smokeless tobacco: Never Used  Substance Use Topics  . Alcohol use: Not Currently    Alcohol/week: 0.0 standard drinks    Comment: 09/13/2014 "might have a drink a couple times/yr"  . Drug use: No    Review of Systems  Constitutional: No fever/chills Eyes: No visual changes. ENT: No sore throat. Cardiovascular: Denies chest pain currently. Respiratory: Denies shortness of breath currently. Gastrointestinal: No abdominal pain.  No nausea, no vomiting.  No diarrhea currently.  No constipation. Genitourinary: Negative for dysuria. Musculoskeletal: Negative for back pain. Skin: Negative for rash. Neurological: Negative for headaches, focal weakness  ____________________________________________   PHYSICAL EXAM:  VITAL SIGNS: ED Triage Vitals  Enc Vitals Group     BP  --      Pulse --      Resp --      Temp 08/16/18 0445 98.2 F (36.8 C)     Temp Source 08/16/18 0445 Oral     SpO2 --      Weight 08/16/18 0446 190 lb (86.2 kg)     Height 08/16/18 0446 5\' 2"  (1.575 m)     Head Circumference --      Peak Flow --      Pain Score 08/16/18 0445 0     Pain Loc --      Pain Edu? --      Excl. in [place]? --     Constitutional: Alert and oriented. Well appearing and in no acute distress. Eyes: Conjunctivae are normal.  Head: Atraumatic. Nose: No congestion/rhinnorhea. Mouth/Throat: Mucous membranes are moist.  Oropharynx non-erythematous. Neck: No stridor.  Cardiovascular: Normal rate, regular rhythm. Grossly normal heart sounds.  Good peripheral circulation. Respiratory: Normal respiratory effort.  No retractions. Lungs CTAB. Gastrointestinal: Soft and nontender. No distention. No abdominal bruits. No CVA tenderness. Musculoskeletal: No lower extremity tenderness nor edema.   Neurologic:  Normal speech and language. No gross focal neurologic deficits are appreciated. No gait instability. Skin:  Skin is warm, dry and intact. No rash noted. Psychiatric: Mood and affect are normal. Speech and behavior are normal.  ____________________________________________   LABS (all labs ordered are listed, but only abnormal results are displayed)  Labs Reviewed  COMPREHENSIVE METABOLIC PANEL - Abnormal; Notable for the following components:      Result Value   CO2 21 (*)    Glucose, Bld 138 (*)    BUN 25 (*)    Calcium 8.4 (*)    All other components within normal limits  CBC WITH DIFFERENTIAL/PLATELET - Abnormal; Notable for the following components:   Hemoglobin 10.5 (*)    HCT 34.8 (*)    All other components within normal limits  TROPONIN I  TROPONIN I  ____________________________________________  EKG  EKG read interpreted by me shows normal sinus rhythm rate of 75 normal axis nonspecific ST-T wave changes  ____________________________________________  RADIOLOGY  ED MD interpretation: Chest x-ray read by radiology reviewed by me is negative Official radiology report(s): Dg Chest Portable 1 View  Result Date: 08/16/2018 CLINICAL DATA:  Left-sided chest wall pain that began at 4 a.m. EXAM: PORTABLE CHEST 1 VIEW COMPARISON:  05/30/2016 FINDINGS: Normal heart size for technique. Prior CABG. Mild aortic tortuosity. There is no edema, consolidation, effusion, or pneumothorax. IMPRESSION: No evidence of active disease. Electronically Signed   By: Monte Fantasia M.D.   On: 08/16/2018 05:57    ____________________________________________   PROCEDURES  Procedure(s) performed (including Critical Care):  Procedures   ____________________________________________   INITIAL IMPRESSION / ASSESSMENT AND PLAN / ED COURSE Patient signed out to Dr. Jimmye Norman pending second troponin      ____________________________________________   FINAL CLINICAL IMPRESSION(S) / ED DIAGNOSES  Final diagnoses:  Nonspecific chest pain     ED Discharge Orders    None       Note:  This document was prepared using Dragon voice recognition software and may include unintentional dictation errors.    Nena Polio, MD 08/16/18 [phone]

## 2018-08-16 NOTE — ED Triage Notes (Signed)
Pt presents to ed via [facility] with c/o left sided chest wall pain that began around 4am. Pt states she took nitro at home with no relief. Pt has hx of anemia and currently pale in color. Pt denies shortness of breath at this time. Pt hypertensive for ems, pt states this is baseline for her. NSR on monitor. Pt currently denies any chest pain at this time.

## 2018-08-16 NOTE — ED Notes (Signed)
Patient denies pain and is resting comfortably.  

## 2018-08-16 NOTE — ED Notes (Signed)
Pt denies CP/ SHOB/ dizziness at this time.

## 2018-08-22 ENCOUNTER — Telehealth: Payer: Self-pay | Admitting: *Deleted

## 2018-08-22 NOTE — Telephone Encounter (Signed)
LMOM- Attempted to reach pt to move procedure to after 09-26-18, move PV to another day this week and inform that it will be over the phone, and to verify insurance card.

## 2018-08-22 NOTE — Telephone Encounter (Signed)
Pt rescheduled PV to 09-22-18 and procedure to 10-06-18

## 2018-09-06 ENCOUNTER — Telehealth: Payer: Self-pay | Admitting: *Deleted

## 2018-09-06 NOTE — Telephone Encounter (Signed)
Pt.rescheduled for 10/21/18 @11 :00 am

## 2018-09-09 ENCOUNTER — Encounter: Payer: Self-pay | Admitting: Gastroenterology

## 2018-09-22 ENCOUNTER — Other Ambulatory Visit: Payer: Self-pay

## 2018-09-22 ENCOUNTER — Ambulatory Visit (AMBULATORY_SURGERY_CENTER): Payer: Self-pay | Admitting: *Deleted

## 2018-09-22 VITALS — Ht 62.5 in | Wt 185.0 lb

## 2018-09-22 DIAGNOSIS — Z800 Family history of malignant neoplasm of digestive organs: Secondary | ICD-10-CM

## 2018-09-22 DIAGNOSIS — K2270 Barrett's esophagus without dysplasia: Secondary | ICD-10-CM

## 2018-09-22 DIAGNOSIS — Z86010 Personal history of colonic polyps: Secondary | ICD-10-CM

## 2018-09-22 NOTE — Progress Notes (Signed)
Patient's pre-visit was done with patient via phone. Instructions mailed to pt. Patient denies any allergies to eggs or soy. Patient denies any problems with anesthesia/sedation. Patient denies any oxygen use at home. Patient denies taking any diet/weight loss medications or blood thinners. EMMI education assisgned to patient on colonoscopy and EGD, this was explained and instructions given to patient. Patient has Suprep at home per pt.

## 2018-09-26 ENCOUNTER — Telehealth: Payer: Self-pay | Admitting: *Deleted

## 2018-09-26 NOTE — Telephone Encounter (Signed)
Patient called, no answer. Left message for her to call us back to reschedule procedures with Dr.Stark.

## 2018-09-29 ENCOUNTER — Inpatient Hospital Stay: Payer: Medicare Other | Attending: Oncology

## 2018-09-29 ENCOUNTER — Other Ambulatory Visit: Payer: Self-pay

## 2018-09-29 DIAGNOSIS — D509 Iron deficiency anemia, unspecified: Secondary | ICD-10-CM | POA: Diagnosis not present

## 2018-09-29 LAB — CBC WITH DIFFERENTIAL/PLATELET
Abs Immature Granulocytes: 0.02 10*3/uL (ref 0.00–0.07)
Basophils Absolute: 0 10*3/uL (ref 0.0–0.1)
Basophils Relative: 1 %
Eosinophils Absolute: 0.3 10*3/uL (ref 0.0–0.5)
Eosinophils Relative: 5 %
HCT: 37.6 % (ref 36.0–46.0)
Hemoglobin: 11.3 g/dL — ABNORMAL LOW (ref 12.0–15.0)
Immature Granulocytes: 0 %
Lymphocytes Relative: 22 %
Lymphs Abs: 1.4 10*3/uL (ref 0.7–4.0)
MCH: 24.3 pg — ABNORMAL LOW (ref 26.0–34.0)
MCHC: 30.1 g/dL (ref 30.0–36.0)
MCV: 80.9 fL (ref 80.0–100.0)
Monocytes Absolute: 0.8 10*3/uL (ref 0.1–1.0)
Monocytes Relative: 13 %
Neutro Abs: 3.7 10*3/uL (ref 1.7–7.7)
Neutrophils Relative %: 59 %
Platelets: 264 10*3/uL (ref 150–400)
RBC: 4.65 MIL/uL (ref 3.87–5.11)
RDW: 16.6 % — ABNORMAL HIGH (ref 11.5–15.5)
WBC: 6.3 10*3/uL (ref 4.0–10.5)
nRBC: 0 % (ref 0.0–0.2)

## 2018-09-29 LAB — IRON AND TIBC
Iron: 23 ug/dL — ABNORMAL LOW (ref 28–170)
Saturation Ratios: 5 % — ABNORMAL LOW (ref 10.4–31.8)
TIBC: 448 ug/dL (ref 250–450)
UIBC: 425 ug/dL

## 2018-09-29 LAB — FERRITIN: Ferritin: 7 ng/mL — ABNORMAL LOW (ref 11–307)

## 2018-09-29 NOTE — Progress Notes (Signed)
[place]  [phone] [phone]) [phone]  ID: [name] OB: 03/17/1946  MR#: [id number]  GEZ#:[id number]  Patient Care Team: [name], MD as PCP - General (Family Medicine) [name], MD (Internal Medicine) [name], MD as Consulting Physician (Cardiothoracic Surgery) [name], MD as Consulting Physician (Cardiology)  I connected with [name] on 09/30/18 at 10:15 AM EDT by video enabled telemedicine visit and verified that I am speaking with the correct person using two identifiers.   I discussed the limitations, risks, security and privacy concerns of performing an evaluation and management service by telemedicine and the availability of in-person appointments. I also discussed with the patient that there may be a patient responsible charge related to this service. The patient expressed understanding and agreed to proceed.   Other persons participating in the visit and their role in the encounter: Patient, MD  Patient's location: Place of employment Provider's location: Clinic  CHIEF COMPLAINT: Iron deficiency anemia.  INTERVAL HISTORY: Patient agreed to video enabled telemedicine visit for further evaluation and discussion of her laboratory results.  She continues to feel well and remains asymptomatic.  She continues to be active and is working full-time spite the COVID-19 pandemic. She does not complain of weakness or fatigue today.  She has no neurologic complaints. She denies any recent fevers or illnesses. She has a good appetite and denies weight loss.  She denies any chest pain, shortness of breath, cough, or hemoptysis.  She denies any nausea, vomiting, constipation, or diarrhea. She denies any melena or hematochezia. She has no urinary complaints.  Patient offers no specific complaints today.  REVIEW OF SYSTEMS:   Review of Systems  Constitutional: Negative.  Negative for fever,  malaise/fatigue and weight loss.  Respiratory: Negative.  Negative for cough and shortness of breath.   Cardiovascular: Negative.  Negative for chest pain and leg swelling.  Gastrointestinal: Negative.  Negative for abdominal pain, blood in stool and melena.  Genitourinary: Negative.  Negative for hematuria.  Musculoskeletal: Negative.  Negative for joint pain.  Skin: Negative.  Negative for rash.  Neurological: Negative.  Negative for sensory change, focal weakness and weakness.  Psychiatric/Behavioral: Negative.  The patient is not nervous/anxious and does not have insomnia.     As per HPI. Otherwise, a complete review of systems is negative.  PAST MEDICAL HISTORY: Past Medical History:  Diagnosis Date  . Anemia   . Anginal pain ([place])   . Anxiety   . Arthritis   . CAD (coronary artery disease)   . Carpal tunnel syndrome    bilateral  . Cataract    bil removed cataracts  . Coronary artery disease    2x stents, Dr. Clayborn Bigness  . Depression   . Dysrhythmia   . Family history of adverse reaction to anesthesia    "brother; S/P lipotripsy in [place]; transferred to [facility]; had to put him on life support for 2 days"  . GERD (gastroesophageal reflux disease)   . Headache    "weekly" (09/13/2014)  . History of blood transfusion 05/2014   "we haven't figured out why I needed it yet" (09/13/2014)  . History of hiatal hernia   . Hyperglycemia   . Hyperlipidemia   . Hypertension   . Myocardial infarct ([place])   . Myocardial infarct, old 1999   15 years ago  . Obesity   . PONV (postoperative nausea and vomiting)   . S/P CABG x 4 09/18/2014  LIMA to LAD, SVG to PDA-dRCA sequentially, SVG to OM, EVH via right thigh   . Sleep apnea   . Sleep apnea    "don't use a mask" (09/13/2014)  . Unstable angina ([place])     PAST SURGICAL HISTORY: Past Surgical History:  Procedure Laterality Date  . ABDOMINAL HYSTERECTOMY    . APPENDECTOMY    . BACK SURGERY    . BLADDER SURGERY    .  CARDIAC CATHETERIZATION  06/2014  . CARDIAC CATHETERIZATION N/A 06/16/2016   Procedure: Left Heart Cath and Cors/Grafts Angiography;  Surgeon: Burnell Blanks, MD;  Location: [place] [facility];  Service: Cardiovascular;  Laterality: N/A;  . CARPAL TUNNEL RELEASE Left   . CATARACT EXTRACTION W/ INTRAOCULAR LENS  IMPLANT, BILATERAL    . CHOLECYSTECTOMY    . COLONOSCOPY    . CORONARY ANGIOPLASTY WITH STENT PLACEMENT  1999   [facility] x2 stent  . CORONARY ARTERY BYPASS GRAFT N/A 09/18/2014   Procedure: CORONARY ARTERY BYPASS GRAFTING (CABG)times four using LIMA  to LAD:SVG to  PD and DIST RCA;SVG to OM;, EVH Right thigh;  Surgeon: [name], MD;  Location: [place];  Service: Open Heart Surgery;  Laterality: N/A;  . ESOPHAGOGASTRODUODENOSCOPY N/A 09/17/2014   Procedure: ESOPHAGOGASTRODUODENOSCOPY (EGD);  Surgeon: Inda Castle, MD;  Location: [place];  Service: Endoscopy;  Laterality: N/A;  . heart stents     1999  . INCONTINENCE SURGERY    . INGUINAL HERNIA REPAIR    . INGUINAL HERNIA REPAIR Right X 3   "last one was in the 1990's; still have hernia there now" (09/13/2014)  . LAPAROSCOPIC CHOLECYSTECTOMY    . LUMBAR DISC SURGERY  1990's?  . LUMBAR SPINE SURGERY    . removal of ovaries    . TEE WITHOUT CARDIOVERSION N/A 09/18/2014   Procedure: TRANSESOPHAGEAL ECHOCARDIOGRAM (TEE);  Surgeon: [name], MD;  Location: [place];  Service: Open Heart Surgery;  Laterality: N/A;  . UPPER GASTROINTESTINAL ENDOSCOPY      FAMILY HISTORY: Family History  Problem Relation Age of Onset  . Coronary artery disease Mother   . Osteoporosis Mother   . Heart disease Mother   . Stroke Mother   . Heart attack Mother   . Hypertension Mother   . Hypertension Father   . Coronary artery disease Father   . COPD Father   . Heart disease Father   . Heart attack Father   . Glaucoma Father   . Osteoporosis Father   . Emphysema Father   . Cancer Sister 42       colon  . Hypertension Sister    . Colon cancer Sister        dx in her 49's  . Breast cancer Sister   . Colon polyps Sister   . Stroke Brother   . Hypertension Brother   . Colon polyps Brother   . Stroke Maternal Grandfather   . Stroke Maternal Grandmother   . Osteoporosis Maternal Grandmother   . Hypertension Maternal Grandmother   . Hypertension Paternal Grandmother   . Hypertension Paternal Grandfather   . Breast cancer Maternal Aunt        2 aunts-50's  . Esophageal cancer Neg Hx   . Stomach cancer Neg Hx   . Rectal cancer Neg Hx   . Pancreatic cancer Neg Hx     ADVANCED DIRECTIVES (Y/N):  N  HEALTH MAINTENANCE: Social History   Tobacco Use  . Smoking status: Never Smoker  .  Smokeless tobacco: Never Used  Substance Use Topics  . Alcohol use: Not Currently    Alcohol/week: 0.0 standard drinks  . Drug use: No     Colonoscopy:  PAP:  Bone density:  Lipid panel:  Allergies  Allergen Reactions  . Ferrous Sulfate [Ferrous Fumarate] Other (See Comments)    STOMACH ISSUES  . Lipitor [Atorvastatin] Other (See Comments)    myalgia    Current Outpatient Medications  Medication Sig Dispense Refill  . acetaminophen (TYLENOL) 500 MG tablet Take 500 mg by mouth every 6 (six) hours as needed for headache.    Marland Kitchen aspirin EC 81 MG tablet Take 1 tablet (81 mg total) by mouth daily.    . diazepam (VALIUM) 5 MG tablet Take one tablet (5 mg) on arrival for your MRI 1 tablet 0  . lisinopril (PRINIVIL,ZESTRIL) 5 MG tablet TAKE 1 TABLET BY MOUTH ONCE DAILY 90 tablet 0  . metoprolol succinate (TOPROL-XL) 50 MG 24 hr tablet TAKE 1 TABLET BY MOUTH ONCE DAILY 30 tablet 3  . pantoprazole (PROTONIX) 40 MG tablet TAKE 1 TABLET BY MOUTH TWICE DAILY 60 tablet 2  . rosuvastatin (CRESTOR) 10 MG tablet TAKE 1 TABLET BY MOUTH ONCE DAILY 30 tablet 2  . sertraline (ZOLOFT) 100 MG tablet TAKE 1 TABLET BY MOUTH ONCE DAILY 90 tablet 0   No current facility-administered medications for this visit.     OBJECTIVE: There were no  vitals filed for this visit.   There is no height or weight on file to calculate BMI.    ECOG FS:0 - Asymptomatic  General: Well-developed, well-nourished, no acute distress. HEENT: Normocephalic. Neuro: Alert, answering all questions appropriately. Cranial nerves grossly intact. Skin: No rashes or petechiae noted. Psych: Normal affect.  LAB RESULTS:  Lab Results  Component Value Date   NA 139 08/16/2018   K 4.8 08/16/2018   CL 110 08/16/2018   CO2 21 (L) 08/16/2018   GLUCOSE 138 (H) 08/16/2018   BUN 25 (H) 08/16/2018   CREATININE 0.92 08/16/2018   CALCIUM 8.4 (L) 08/16/2018   PROT 6.6 08/16/2018   ALBUMIN 3.6 08/16/2018   AST 22 08/16/2018   ALT 13 08/16/2018   ALKPHOS 47 08/16/2018   BILITOT 0.7 08/16/2018   GFRNONAA >60 08/16/2018   GFRAA >60 08/16/2018    Lab Results  Component Value Date   WBC 6.3 09/29/2018   NEUTROABS 3.7 09/29/2018   HGB 11.3 (L) 09/29/2018   HCT 37.6 09/29/2018   MCV 80.9 09/29/2018   PLT 264 09/29/2018   Lab Results  Component Value Date   IRON 23 (L) 09/29/2018   TIBC 448 09/29/2018   IRONPCTSAT 5 (L) 09/29/2018   Lab Results  Component Value Date   FERRITIN 7 (L) 09/29/2018     STUDIES: No results found.  ASSESSMENT: Iron deficiency anemia.  PLAN:    1. Iron deficiency anemia: Patient's hemoglobin has trended down slightly to 11.3 and her iron stores are decreased.  Previously, she reported she cannot tolerate oral iron supplementation. The remainder of her laboratory work is either negative or within normal limits.  Patient had a normal endoscopy in May 2017 and a normal colonoscopy in August 2014.  Patient is asymptomatic, therefore she does not require additional IV Feraheme but will likely need infusion at her next clinic visit.  Patient last received treatment on May 05, 2017.  Return to clinic in 3 months for further evaluation and Feraheme infusion.   I provided 15 minutes of face-to-face video  visit time during  this encounter, and > 50% was spent counseling as documented under my assessment & plan.  Patient expressed understanding and was in agreement with this plan. She also understands that She can call clinic at any time with any questions, concerns, or complaints.    Lloyd Huger, MD   09/30/2018 12:23 PM

## 2018-09-30 ENCOUNTER — Inpatient Hospital Stay (HOSPITAL_BASED_OUTPATIENT_CLINIC_OR_DEPARTMENT_OTHER): Payer: Medicare Other | Admitting: Oncology

## 2018-09-30 ENCOUNTER — Ambulatory Visit: Payer: Medicare Other

## 2018-09-30 ENCOUNTER — Other Ambulatory Visit: Payer: Self-pay

## 2018-09-30 ENCOUNTER — Encounter: Payer: Self-pay | Admitting: Oncology

## 2018-09-30 ENCOUNTER — Other Ambulatory Visit: Payer: Medicare Other

## 2018-09-30 ENCOUNTER — Ambulatory Visit: Payer: Medicare Other | Admitting: Oncology

## 2018-09-30 DIAGNOSIS — D509 Iron deficiency anemia, unspecified: Secondary | ICD-10-CM | POA: Diagnosis not present

## 2018-09-30 NOTE — Progress Notes (Signed)
Patient stated that she had been doing better after fainting on 08/16/2018 and then going to the hospital. Patient stated that she was told that her hemoglobin was low. Patient stated that there are days that she feels tired and fatigued.

## 2018-10-06 ENCOUNTER — Encounter: Payer: Self-pay | Admitting: Gastroenterology

## 2018-10-21 ENCOUNTER — Encounter: Payer: Self-pay | Admitting: Gastroenterology

## 2018-10-24 ENCOUNTER — Encounter: Payer: Medicare Other | Admitting: Gastroenterology

## 2018-10-24 ENCOUNTER — Encounter: Payer: Self-pay | Admitting: Family Medicine

## 2018-11-14 ENCOUNTER — Other Ambulatory Visit: Payer: Self-pay | Admitting: Family Medicine

## 2018-11-14 ENCOUNTER — Other Ambulatory Visit: Payer: Self-pay | Admitting: Internal Medicine

## 2018-11-14 DIAGNOSIS — K2270 Barrett's esophagus without dysplasia: Secondary | ICD-10-CM

## 2018-11-14 DIAGNOSIS — F32A Depression, unspecified: Secondary | ICD-10-CM

## 2018-11-14 DIAGNOSIS — F329 Major depressive disorder, single episode, unspecified: Secondary | ICD-10-CM

## 2018-11-15 NOTE — Telephone Encounter (Signed)
She has not been seen by me in over 2 years. She needs to be seen for follow-up to receive a refill. Thanks.

## 2018-11-17 ENCOUNTER — Encounter: Payer: Medicare Other | Admitting: Gastroenterology

## 2018-12-18 NOTE — Progress Notes (Deleted)
[place]  Telephone:(336) [phone] [phone]) [phone]  ID: [name] OB: 03-27-1946  MR#: [id number]  KGM#:[id number]  Patient Care Team: [name], MD as PCP - General (Family Medicine) [name], MD (Internal Medicine) [name], MD as Consulting Physician (Cardiothoracic Surgery) [name], MD as Consulting Physician (Cardiology)   CHIEF COMPLAINT: Iron deficiency anemia.  INTERVAL HISTORY: Patient agreed to video enabled telemedicine visit for further evaluation and discussion of her laboratory results.  She continues to feel well and remains asymptomatic.  She continues to be active and is working full-time spite the COVID-19 pandemic. She does not complain of weakness or fatigue today.  She has no neurologic complaints. She denies any recent fevers or illnesses. She has a good appetite and denies weight loss.  She denies any chest pain, shortness of breath, cough, or hemoptysis.  She denies any nausea, vomiting, constipation, or diarrhea. She denies any melena or hematochezia. She has no urinary complaints.  Patient offers no specific complaints today.  REVIEW OF SYSTEMS:   Review of Systems  Constitutional: Negative.  Negative for fever, malaise/fatigue and weight loss.  Respiratory: Negative.  Negative for cough and shortness of breath.   Cardiovascular: Negative.  Negative for chest pain and leg swelling.  Gastrointestinal: Negative.  Negative for abdominal pain, blood in stool and melena.  Genitourinary: Negative.  Negative for hematuria.  Musculoskeletal: Negative.  Negative for joint pain.  Skin: Negative.  Negative for rash.  Neurological: Negative.  Negative for sensory change, focal weakness and weakness.  Psychiatric/Behavioral: Negative.  The patient is not nervous/anxious and does not have insomnia.     As per HPI. Otherwise, a complete review of systems is negative.  PAST MEDICAL HISTORY: Past Medical  History:  Diagnosis Date  . Anemia   . Anginal pain ([place])   . Anxiety   . Arthritis   . CAD (coronary artery disease)   . Carpal tunnel syndrome    bilateral  . Cataract    bil removed cataracts  . Coronary artery disease    2x stents, Dr. Clayborn Bigness  . Depression   . Dysrhythmia   . Family history of adverse reaction to anesthesia    "brother; S/P lipotripsy in [place]; transferred to [facility]; had to put him on life support for 2 days"  . GERD (gastroesophageal reflux disease)   . Headache    "weekly" (09/13/2014)  . History of blood transfusion 05/2014   "we haven't figured out why I needed it yet" (09/13/2014)  . History of hiatal hernia   . Hyperglycemia   . Hyperlipidemia   . Hypertension   . Myocardial infarct ([place])   . Myocardial infarct, old 1999   15 years ago  . Obesity   . PONV (postoperative nausea and vomiting)   . S/P CABG x 4 09/18/2014   LIMA to LAD, SVG to PDA-dRCA sequentially, SVG to OM, EVH via right thigh   . Sleep apnea   . Sleep apnea    "don't use a mask" (09/13/2014)  . Unstable angina ([place])     PAST SURGICAL HISTORY: Past Surgical History:  Procedure Laterality Date  . ABDOMINAL HYSTERECTOMY    . APPENDECTOMY    . BACK SURGERY    . BLADDER SURGERY    . CARDIAC CATHETERIZATION  06/2014  . CARDIAC CATHETERIZATION N/A 06/16/2016   Procedure: Left Heart Cath and Cors/Grafts Angiography;  Surgeon: Burnell Blanks, MD;  Location: [place] [facility];  Service: Cardiovascular;  Laterality: N/A;  . CARPAL TUNNEL RELEASE Left   . CATARACT EXTRACTION W/ INTRAOCULAR LENS  IMPLANT, BILATERAL    . CHOLECYSTECTOMY    . COLONOSCOPY    . CORONARY ANGIOPLASTY WITH STENT PLACEMENT  1999   [facility] x2 stent  . CORONARY ARTERY BYPASS GRAFT N/A 09/18/2014   Procedure: CORONARY ARTERY BYPASS GRAFTING (CABG)times four using LIMA  to LAD:SVG to  PD and DIST RCA;SVG to OM;, EVH Right thigh;  Surgeon: [name], MD;  Location: [place];  Service: Open  Heart Surgery;  Laterality: N/A;  . ESOPHAGOGASTRODUODENOSCOPY N/A 09/17/2014   Procedure: ESOPHAGOGASTRODUODENOSCOPY (EGD);  Surgeon: Inda Castle, MD;  Location: [place];  Service: Endoscopy;  Laterality: N/A;  . heart stents     1999  . INCONTINENCE SURGERY    . INGUINAL HERNIA REPAIR    . INGUINAL HERNIA REPAIR Right X 3   "last one was in the 1990's; still have hernia there now" (09/13/2014)  . LAPAROSCOPIC CHOLECYSTECTOMY    . LUMBAR DISC SURGERY  1990's?  . LUMBAR SPINE SURGERY    . removal of ovaries    . TEE WITHOUT CARDIOVERSION N/A 09/18/2014   Procedure: TRANSESOPHAGEAL ECHOCARDIOGRAM (TEE);  Surgeon: [name], MD;  Location: [place];  Service: Open Heart Surgery;  Laterality: N/A;  . UPPER GASTROINTESTINAL ENDOSCOPY      FAMILY HISTORY: Family History  Problem Relation Age of Onset  . Coronary artery disease Mother   . Osteoporosis Mother   . Heart disease Mother   . Stroke Mother   . Heart attack Mother   . Hypertension Mother   . Hypertension Father   . Coronary artery disease Father   . COPD Father   . Heart disease Father   . Heart attack Father   . Glaucoma Father   . Osteoporosis Father   . Emphysema Father   . Cancer Sister 50       colon  . Hypertension Sister   . Colon cancer Sister        dx in her 58's  . Breast cancer Sister   . Colon polyps Sister   . Stroke Brother   . Hypertension Brother   . Colon polyps Brother   . Stroke Maternal Grandfather   . Stroke Maternal Grandmother   . Osteoporosis Maternal Grandmother   . Hypertension Maternal Grandmother   . Hypertension Paternal Grandmother   . Hypertension Paternal Grandfather   . Breast cancer Maternal Aunt        2 aunts-50's  . Esophageal cancer Neg Hx   . Stomach cancer Neg Hx   . Rectal cancer Neg Hx   . Pancreatic cancer Neg Hx     ADVANCED DIRECTIVES (Y/N):  N  HEALTH MAINTENANCE: Social History   Tobacco Use  . Smoking status: Never Smoker  . Smokeless  tobacco: Never Used  Substance Use Topics  . Alcohol use: Not Currently    Alcohol/week: 0.0 standard drinks  . Drug use: No     Colonoscopy:  PAP:  Bone density:  Lipid panel:  Allergies  Allergen Reactions  . Ferrous Sulfate [Ferrous Fumarate] Other (See Comments)    STOMACH ISSUES  . Lipitor [Atorvastatin] Other (See Comments)    myalgia    Current Outpatient Medications  Medication Sig Dispense Refill  . acetaminophen (TYLENOL) 500 MG tablet Take 500 mg by mouth every 6 (six) hours as needed for headache.    Marland Kitchen aspirin EC 81 MG  tablet Take 1 tablet (81 mg total) by mouth daily.    . diazepam (VALIUM) 5 MG tablet Take one tablet (5 mg) on arrival for your MRI 1 tablet 0  . lisinopril (PRINIVIL,ZESTRIL) 5 MG tablet TAKE 1 TABLET BY MOUTH ONCE DAILY 90 tablet 0  . metoprolol succinate (TOPROL-XL) 50 MG 24 hr tablet TAKE 1 TABLET BY MOUTH ONCE DAILY 30 tablet 3  . pantoprazole (PROTONIX) 40 MG tablet TAKE 1 TABLET BY MOUTH TWICE DAILY 60 tablet 2  . rosuvastatin (CRESTOR) 10 MG tablet TAKE 1 TABLET BY MOUTH ONCE DAILY 30 tablet 2  . sertraline (ZOLOFT) 100 MG tablet TAKE 1 TABLET BY MOUTH ONCE DAILY 90 tablet 0   No current facility-administered medications for this visit.     OBJECTIVE: There were no vitals filed for this visit.   There is no height or weight on file to calculate BMI.    ECOG FS:0 - Asymptomatic  General: Well-developed, well-nourished, no acute distress. HEENT: Normocephalic. Neuro: Alert, answering all questions appropriately. Cranial nerves grossly intact. Skin: No rashes or petechiae noted. Psych: Normal affect.  LAB RESULTS:  Lab Results  Component Value Date   NA 139 08/16/2018   K 4.8 08/16/2018   CL 110 08/16/2018   CO2 21 (L) 08/16/2018   GLUCOSE 138 (H) 08/16/2018   BUN 25 (H) 08/16/2018   CREATININE 0.92 08/16/2018   CALCIUM 8.4 (L) 08/16/2018   PROT 6.6 08/16/2018   ALBUMIN 3.6 08/16/2018   AST 22 08/16/2018   ALT 13 08/16/2018    ALKPHOS 47 08/16/2018   BILITOT 0.7 08/16/2018   GFRNONAA >60 08/16/2018   GFRAA >60 08/16/2018    Lab Results  Component Value Date   WBC 6.3 09/29/2018   NEUTROABS 3.7 09/29/2018   HGB 11.3 (L) 09/29/2018   HCT 37.6 09/29/2018   MCV 80.9 09/29/2018   PLT 264 09/29/2018   Lab Results  Component Value Date   IRON 23 (L) 09/29/2018   TIBC 448 09/29/2018   IRONPCTSAT 5 (L) 09/29/2018   Lab Results  Component Value Date   FERRITIN 7 (L) 09/29/2018     STUDIES: No results found.  ASSESSMENT: Iron deficiency anemia.  PLAN:    1. Iron deficiency anemia: Patient's hemoglobin has trended down slightly to 11.3 and her iron stores are decreased.  Previously, she reported she cannot tolerate oral iron supplementation. The remainder of her laboratory work is either negative or within normal limits.  Patient had a normal endoscopy in May 2017 and a normal colonoscopy in August 2014.  Patient is asymptomatic, therefore she does not require additional IV Feraheme but will likely need infusion at her next clinic visit.  Patient last received treatment on May 05, 2017.  Return to clinic in 3 months for further evaluation and Feraheme infusion.   Patient expressed understanding and was in agreement with this plan. She also understands that She can call clinic at any time with any questions, concerns, or complaints.    Lloyd Huger, MD   12/18/2018 10:56 AM

## 2018-12-20 ENCOUNTER — Inpatient Hospital Stay: Payer: Medicare Other | Admitting: Oncology

## 2018-12-20 ENCOUNTER — Inpatient Hospital Stay: Payer: Medicare Other

## 2018-12-27 ENCOUNTER — Ambulatory Visit: Payer: Medicare Other

## 2018-12-27 ENCOUNTER — Telehealth: Payer: Self-pay

## 2018-12-27 ENCOUNTER — Other Ambulatory Visit: Payer: Self-pay

## 2018-12-27 NOTE — Telephone Encounter (Signed)
Called patient at agreed upon time and phone number. Invalid number.  Called again at secondary number on file, no answer. Left vm to call back during appointment window today, or at 2pm today, or reschedule for another day. Please reschedule as appropriate.

## 2019-01-04 ENCOUNTER — Encounter: Payer: Medicare Other | Admitting: Family Medicine

## 2019-01-13 ENCOUNTER — Other Ambulatory Visit: Payer: Self-pay

## 2019-01-13 NOTE — Progress Notes (Signed)
[facility]  Telephone:(336) [phone] Fax:(336) [phone]  ID: Debbie Delacruz OB: 1945/08/16  MR#: [id number]  [id number]  Patient Care Team: [name], MD as PCP - General (Family Medicine) [name], MD (Internal Medicine) [name], MD as Consulting Physician (Cardiothoracic Surgery) [name], MD as Consulting Physician (Cardiology)   CHIEF COMPLAINT: Iron deficiency anemia.  INTERVAL HISTORY: Patient returns to clinic today for repeat laboratory work and further evaluation.  She has noticed increased weakness and fatigue over the past several weeks, but otherwise feels well. She has no neurologic complaints. She denies any recent fevers or illnesses. She has a good appetite and denies weight loss.  She denies any chest pain, shortness of breath, cough, or hemoptysis.  She denies any nausea, vomiting, constipation, or diarrhea. She denies any melena or hematochezia. She has no urinary complaints.  Patient offers no further specific complaints today.  REVIEW OF SYSTEMS:   Review of Systems  Constitutional: Positive for malaise/fatigue. Negative for fever and weight loss.  Respiratory: Negative.  Negative for cough and shortness of breath.   Cardiovascular: Negative.  Negative for chest pain and leg swelling.  Gastrointestinal: Negative.  Negative for abdominal pain, blood in stool and melena.  Genitourinary: Negative.  Negative for hematuria.  Musculoskeletal: Negative.  Negative for joint pain.  Skin: Negative.  Negative for rash.  Neurological: Positive for weakness. Negative for dizziness, sensory change, focal weakness and headaches.  Psychiatric/Behavioral: Negative.  The patient is not nervous/anxious and does not have insomnia.     As per HPI. Otherwise, a complete review of systems is negative.  PAST MEDICAL HISTORY: Past Medical History:  Diagnosis Date  . Anemia   . Anginal pain ([place])   . Anxiety   .  Arthritis   . CAD (coronary artery disease)   . Carpal tunnel syndrome    bilateral  . Cataract    bil removed cataracts  . Coronary artery disease    2x stents, Dr. Clayborn Bigness  . Depression   . Dysrhythmia   . Family history of adverse reaction to anesthesia    "brother; S/P lipotripsy in [place]; transferred to [facility]; had to put him on life support for 2 days"  . GERD (gastroesophageal reflux disease)   . Headache    "weekly" (09/13/2014)  . History of blood transfusion 05/2014   "we haven't figured out why I needed it yet" (09/13/2014)  . History of hiatal hernia   . Hyperglycemia   . Hyperlipidemia   . Hypertension   . Myocardial infarct ([place])   . Myocardial infarct, old 1999   15 years ago  . Obesity   . PONV (postoperative nausea and vomiting)   . S/P CABG x 4 09/18/2014   LIMA to LAD, SVG to PDA-dRCA sequentially, SVG to OM, EVH via right thigh   . Sleep apnea   . Sleep apnea    "don't use a mask" (09/13/2014)  . Unstable angina ([place])     PAST SURGICAL HISTORY: Past Surgical History:  Procedure Laterality Date  . ABDOMINAL HYSTERECTOMY    . APPENDECTOMY    . BACK SURGERY    . BLADDER SURGERY    . CARDIAC CATHETERIZATION  06/2014  . CARDIAC CATHETERIZATION N/A 06/16/2016   Procedure: Left Heart Cath and Cors/Grafts Angiography;  Surgeon: Burnell Blanks, MD;  Location: [facility];  Service: Cardiovascular;  Laterality: N/A;  . CARPAL TUNNEL RELEASE Left   . CATARACT EXTRACTION  W/ INTRAOCULAR LENS  IMPLANT, BILATERAL    . CHOLECYSTECTOMY    . COLONOSCOPY    . CORONARY ANGIOPLASTY WITH STENT PLACEMENT  1999   [facility] x2 stent  . CORONARY ARTERY BYPASS GRAFT N/A 09/18/2014   Procedure: CORONARY ARTERY BYPASS GRAFTING (CABG)times four using LIMA  to LAD:SVG to  PD and DIST RCA;SVG to OM;, EVH Right thigh;  Surgeon: [name], MD;  Location: [facility];  Service: Open Heart Surgery;  Laterality: N/A;  . ESOPHAGOGASTRODUODENOSCOPY N/A 09/17/2014    Procedure: ESOPHAGOGASTRODUODENOSCOPY (EGD);  Surgeon: Inda Castle, MD;  Location: [facility];  Service: Endoscopy;  Laterality: N/A;  . heart stents     1999  . INCONTINENCE SURGERY    . INGUINAL HERNIA REPAIR    . INGUINAL HERNIA REPAIR Right X 3   "last one was in the 1990's; still have hernia there now" (09/13/2014)  . LAPAROSCOPIC CHOLECYSTECTOMY    . LUMBAR DISC SURGERY  1990's?  . LUMBAR SPINE SURGERY    . removal of ovaries    . TEE WITHOUT CARDIOVERSION N/A 09/18/2014   Procedure: TRANSESOPHAGEAL ECHOCARDIOGRAM (TEE);  Surgeon: [name], MD;  Location: [facility];  Service: Open Heart Surgery;  Laterality: N/A;  . UPPER GASTROINTESTINAL ENDOSCOPY      FAMILY HISTORY: Family History  Problem Relation Age of Onset  . Coronary artery disease Mother   . Osteoporosis Mother   . Heart disease Mother   . Stroke Mother   . Heart attack Mother   . Hypertension Mother   . Hypertension Father   . Coronary artery disease Father   . COPD Father   . Heart disease Father   . Heart attack Father   . Glaucoma Father   . Osteoporosis Father   . Emphysema Father   . Cancer Sister 28       colon  . Hypertension Sister   . Colon cancer Sister        dx in her 74's  . Breast cancer Sister   . Colon polyps Sister   . Stroke Brother   . Hypertension Brother   . Colon polyps Brother   . Stroke Maternal Grandfather   . Stroke Maternal Grandmother   . Osteoporosis Maternal Grandmother   . Hypertension Maternal Grandmother   . Hypertension Paternal Grandmother   . Hypertension Paternal Grandfather   . Breast cancer Maternal Aunt        2 aunts-50's  . Esophageal cancer Neg Hx   . Stomach cancer Neg Hx   . Rectal cancer Neg Hx   . Pancreatic cancer Neg Hx     ADVANCED DIRECTIVES (Y/N):  N  HEALTH MAINTENANCE: Social History   Tobacco Use  . Smoking status: Never Smoker  . Smokeless tobacco: Never Used  Substance Use Topics  . Alcohol use: Not Currently     Alcohol/week: 0.0 standard drinks  . Drug use: No     Colonoscopy:  PAP:  Bone density:  Lipid panel:  Allergies  Allergen Reactions  . Ferrous Sulfate [Ferrous Fumarate] Other (See Comments)    STOMACH ISSUES  . Lipitor [Atorvastatin] Other (See Comments)    myalgia    Current Outpatient Medications  Medication Sig Dispense Refill  . acetaminophen (TYLENOL) 500 MG tablet Take 500 mg by mouth every 6 (six) hours as needed for headache.    Marland Kitchen aspirin EC 81 MG tablet Take 1 tablet (81 mg total) by mouth daily.    Marland Kitchen lisinopril (PRINIVIL,ZESTRIL)  5 MG tablet TAKE 1 TABLET BY MOUTH ONCE DAILY 90 tablet 0  . metoprolol succinate (TOPROL-XL) 50 MG 24 hr tablet TAKE 1 TABLET BY MOUTH ONCE DAILY 30 tablet 3  . pantoprazole (PROTONIX) 40 MG tablet TAKE 1 TABLET BY MOUTH TWICE DAILY 60 tablet 2  . rosuvastatin (CRESTOR) 10 MG tablet TAKE 1 TABLET BY MOUTH ONCE DAILY 30 tablet 2  . sertraline (ZOLOFT) 100 MG tablet TAKE 1 TABLET BY MOUTH ONCE DAILY 90 tablet 0  . diazepam (VALIUM) 5 MG tablet Take one tablet (5 mg) on arrival for your MRI (Patient not taking: Reported on 01/16/2019) 1 tablet 0   No current facility-administered medications for this visit.     OBJECTIVE: Vitals:   01/16/19 1320  BP: (!) 175/107  Pulse: 76  Resp: 16  Temp: 98.5 F (36.9 C)     Body mass index is 33.75 kg/m.    ECOG FS:0 - Asymptomatic  General: Well-developed, well-nourished, no acute distress. Eyes: Pink conjunctiva, anicteric sclera. HEENT: Normocephalic, moist mucous membranes. Lungs: Clear to auscultation bilaterally. Heart: Regular rate and rhythm. No rubs, murmurs, or gallops. Abdomen: Soft, nontender, nondistended. No organomegaly noted, normoactive bowel sounds. Musculoskeletal: No edema, cyanosis, or clubbing. Neuro: Alert, answering all questions appropriately. Cranial nerves grossly intact. Skin: No rashes or petechiae noted. Psych: Normal affect.  LAB RESULTS:  Lab Results   Component Value Date   NA 139 08/16/2018   K 4.8 08/16/2018   CL 110 08/16/2018   CO2 21 (L) 08/16/2018   GLUCOSE 138 (H) 08/16/2018   BUN 25 (H) 08/16/2018   CREATININE 0.92 08/16/2018   CALCIUM 8.4 (L) 08/16/2018   PROT 6.6 08/16/2018   ALBUMIN 3.6 08/16/2018   AST 22 08/16/2018   ALT 13 08/16/2018   ALKPHOS 47 08/16/2018   BILITOT 0.7 08/16/2018   GFRNONAA >60 08/16/2018   GFRAA >60 08/16/2018    Lab Results  Component Value Date   WBC 7.4 01/16/2019   NEUTROABS 4.8 01/16/2019   HGB 12.8 01/16/2019   HCT 41.6 01/16/2019   MCV 78.8 (L) 01/16/2019   PLT 248 01/16/2019   Lab Results  Component Value Date   IRON 38 01/16/2019   TIBC 481 (H) 01/16/2019   IRONPCTSAT 8 (L) 01/16/2019   Lab Results  Component Value Date   FERRITIN 9 (L) 01/16/2019     STUDIES: No results found.  ASSESSMENT: Iron deficiency anemia.  PLAN:    1. Iron deficiency anemia: Although patient's hemoglobin has trended up and is now within normal limits, her iron stores are significantly reduced and she is symptomatic.  Previously, she reported she cannot tolerate oral iron supplementation. The remainder of her laboratory work is either negative or within normal limits.  Patient had a normal endoscopy in May 2017 and a normal colonoscopy in August 2014.  Patient will likely require repeat colonoscopy in the near future.  Proceed with 510 mg IV Feraheme today.  Return to clinic in 1 week for second infusion.  Patient was then return to clinic in 4 months with repeat laboratory work, further evaluation, consideration of additional IV Feraheme.    I spent a total of 30 minutes face-to-face with the patient of which greater than 50% of the visit was spent in counseling and coordination of care as detailed above.   Patient expressed understanding and was in agreement with this plan. She also understands that She can call clinic at any time with any questions, concerns, or complaints.  Lloyd Huger, MD   01/16/2019 3:49 PM

## 2019-01-16 ENCOUNTER — Other Ambulatory Visit: Payer: Self-pay

## 2019-01-16 ENCOUNTER — Encounter: Payer: Self-pay | Admitting: Oncology

## 2019-01-16 ENCOUNTER — Inpatient Hospital Stay: Payer: Medicare Other | Attending: Oncology

## 2019-01-16 ENCOUNTER — Inpatient Hospital Stay: Payer: Medicare Other

## 2019-01-16 ENCOUNTER — Inpatient Hospital Stay (HOSPITAL_BASED_OUTPATIENT_CLINIC_OR_DEPARTMENT_OTHER): Payer: Medicare Other | Admitting: Oncology

## 2019-01-16 VITALS — BP 149/85 | HR 70 | Temp 96.6°F | Resp 18

## 2019-01-16 VITALS — BP 175/107 | HR 76 | Temp 98.5°F | Resp 16 | Wt 187.5 lb

## 2019-01-16 DIAGNOSIS — G4730 Sleep apnea, unspecified: Secondary | ICD-10-CM | POA: Insufficient documentation

## 2019-01-16 DIAGNOSIS — R531 Weakness: Secondary | ICD-10-CM | POA: Diagnosis not present

## 2019-01-16 DIAGNOSIS — Z79899 Other long term (current) drug therapy: Secondary | ICD-10-CM | POA: Insufficient documentation

## 2019-01-16 DIAGNOSIS — R5383 Other fatigue: Secondary | ICD-10-CM | POA: Insufficient documentation

## 2019-01-16 DIAGNOSIS — D509 Iron deficiency anemia, unspecified: Secondary | ICD-10-CM | POA: Diagnosis not present

## 2019-01-16 DIAGNOSIS — I2510 Atherosclerotic heart disease of native coronary artery without angina pectoris: Secondary | ICD-10-CM | POA: Insufficient documentation

## 2019-01-16 DIAGNOSIS — K449 Diaphragmatic hernia without obstruction or gangrene: Secondary | ICD-10-CM | POA: Diagnosis not present

## 2019-01-16 DIAGNOSIS — E785 Hyperlipidemia, unspecified: Secondary | ICD-10-CM | POA: Insufficient documentation

## 2019-01-16 DIAGNOSIS — Z7982 Long term (current) use of aspirin: Secondary | ICD-10-CM | POA: Diagnosis not present

## 2019-01-16 DIAGNOSIS — Z800 Family history of malignant neoplasm of digestive organs: Secondary | ICD-10-CM | POA: Insufficient documentation

## 2019-01-16 DIAGNOSIS — I10 Essential (primary) hypertension: Secondary | ICD-10-CM | POA: Insufficient documentation

## 2019-01-16 DIAGNOSIS — K219 Gastro-esophageal reflux disease without esophagitis: Secondary | ICD-10-CM | POA: Diagnosis not present

## 2019-01-16 DIAGNOSIS — I252 Old myocardial infarction: Secondary | ICD-10-CM | POA: Insufficient documentation

## 2019-01-16 DIAGNOSIS — E669 Obesity, unspecified: Secondary | ICD-10-CM | POA: Diagnosis not present

## 2019-01-16 DIAGNOSIS — F418 Other specified anxiety disorders: Secondary | ICD-10-CM | POA: Diagnosis not present

## 2019-01-16 LAB — CBC WITH DIFFERENTIAL/PLATELET
Abs Immature Granulocytes: 0.02 10*3/uL (ref 0.00–0.07)
Basophils Absolute: 0 10*3/uL (ref 0.0–0.1)
Basophils Relative: 1 %
Eosinophils Absolute: 0.1 10*3/uL (ref 0.0–0.5)
Eosinophils Relative: 2 %
HCT: 41.6 % (ref 36.0–46.0)
Hemoglobin: 12.8 g/dL (ref 12.0–15.0)
Immature Granulocytes: 0 %
Lymphocytes Relative: 23 %
Lymphs Abs: 1.7 10*3/uL (ref 0.7–4.0)
MCH: 24.2 pg — ABNORMAL LOW (ref 26.0–34.0)
MCHC: 30.8 g/dL (ref 30.0–36.0)
MCV: 78.8 fL — ABNORMAL LOW (ref 80.0–100.0)
Monocytes Absolute: 0.8 10*3/uL (ref 0.1–1.0)
Monocytes Relative: 11 %
Neutro Abs: 4.8 10*3/uL (ref 1.7–7.7)
Neutrophils Relative %: 63 %
Platelets: 248 10*3/uL (ref 150–400)
RBC: 5.28 MIL/uL — ABNORMAL HIGH (ref 3.87–5.11)
RDW: 17.1 % — ABNORMAL HIGH (ref 11.5–15.5)
WBC: 7.4 10*3/uL (ref 4.0–10.5)
nRBC: 0 % (ref 0.0–0.2)

## 2019-01-16 LAB — FERRITIN: Ferritin: 9 ng/mL — ABNORMAL LOW (ref 11–307)

## 2019-01-16 LAB — IRON AND TIBC
Iron: 38 ug/dL (ref 28–170)
Saturation Ratios: 8 % — ABNORMAL LOW (ref 10.4–31.8)
TIBC: 481 ug/dL — ABNORMAL HIGH (ref 250–450)
UIBC: 443 ug/dL

## 2019-01-16 MED ORDER — SODIUM CHLORIDE 0.9 % IV SOLN
510.0000 mg | Freq: Once | INTRAVENOUS | Status: AC
  Administered 2019-01-16: 14:00:00 510 mg via INTRAVENOUS
  Filled 2019-01-16: qty 17

## 2019-01-16 MED ORDER — SODIUM CHLORIDE 0.9 % IV SOLN
Freq: Once | INTRAVENOUS | Status: AC
  Administered 2019-01-16: 14:00:00 via INTRAVENOUS
  Filled 2019-01-16: qty 250

## 2019-01-16 NOTE — Progress Notes (Signed)
Patient is here today for follow up, she is doing well. Blood pressure was taken in right arm 175/107 pulse76, took again in left arm 161/114 pulse 73, denies any headache or  blurred vision

## 2019-01-16 NOTE — Progress Notes (Signed)
1345: Per MD proceed with Feraheme with elevated b/p and without fertin and Iron lab results.   1358: Two unsuccessful PIV attempts. Pt states she feels faint and nauseous. ( See flow sheets for VS). Dr. Grayland Ormond aware. 1400: PIV started in left FA and NS saline going. Pt states she "feels better". Pt denies nausea or feeling faint at this time.  Pt and VS stable, Per Dr. Grayland Ormond proceed with [facility] treatment.  1434: pt denies staying for 30 minute post infusion observation. Pt tolerated infusion well. Pt denies any concerns or complaints at this time. Pt and VS stable at discharge.

## 2019-01-23 ENCOUNTER — Inpatient Hospital Stay: Payer: Medicare Other

## 2019-02-01 ENCOUNTER — Inpatient Hospital Stay: Payer: Medicare Other

## 2019-02-10 ENCOUNTER — Encounter: Payer: Medicare Other | Admitting: Family Medicine

## 2019-03-22 ENCOUNTER — Inpatient Hospital Stay: Payer: Medicare Other

## 2019-03-28 ENCOUNTER — Inpatient Hospital Stay: Payer: Medicare Other | Attending: Oncology

## 2019-03-30 ENCOUNTER — Telehealth: Payer: Self-pay | Admitting: Family Medicine

## 2019-03-30 NOTE — Telephone Encounter (Signed)
I called pt twice and left msg to call ofc.

## 2019-03-31 ENCOUNTER — Encounter: Payer: Medicare Other | Admitting: Family Medicine

## 2019-04-26 ENCOUNTER — Inpatient Hospital Stay: Payer: Medicare Other

## 2019-05-02 ENCOUNTER — Other Ambulatory Visit: Payer: Self-pay

## 2019-05-03 ENCOUNTER — Inpatient Hospital Stay: Payer: Medicare Other

## 2019-05-08 ENCOUNTER — Inpatient Hospital Stay: Payer: Medicare Other | Attending: Oncology

## 2019-05-08 ENCOUNTER — Other Ambulatory Visit: Payer: Self-pay

## 2019-05-08 VITALS — BP 158/90 | HR 82

## 2019-05-08 DIAGNOSIS — D509 Iron deficiency anemia, unspecified: Secondary | ICD-10-CM | POA: Insufficient documentation

## 2019-05-08 MED ORDER — SODIUM CHLORIDE 0.9 % IV SOLN
INTRAVENOUS | Status: DC
  Administered 2019-05-08: 14:00:00 via INTRAVENOUS
  Filled 2019-05-08: qty 250

## 2019-05-08 MED ORDER — SODIUM CHLORIDE 0.9 % IV SOLN
510.0000 mg | Freq: Once | INTRAVENOUS | Status: AC
  Administered 2019-05-08: 510 mg via INTRAVENOUS
  Filled 2019-05-08: qty 17

## 2019-05-23 ENCOUNTER — Other Ambulatory Visit: Payer: Self-pay | Admitting: Family Medicine

## 2019-05-23 ENCOUNTER — Other Ambulatory Visit: Payer: Self-pay | Admitting: Internal Medicine

## 2019-06-03 NOTE — Progress Notes (Deleted)
[facility]  Telephone:(336) [phone] Fax:(336) [phone]  ID: Debbie Delacruz OB: 1946/02/02  MR#: [id number]  [id number]  Patient Care Team: [name], MD as PCP - General (Family Medicine) [name], MD (Internal Medicine) [name], MD as Consulting Physician (Cardiothoracic Surgery) [name], MD as Consulting Physician (Cardiology)   CHIEF COMPLAINT: Iron deficiency anemia.  INTERVAL HISTORY: Patient returns to clinic today for repeat laboratory work and further evaluation.  She has noticed increased weakness and fatigue over the past several weeks, but otherwise feels well. She has no neurologic complaints. She denies any recent fevers or illnesses. She has a good appetite and denies weight loss.  She denies any chest pain, shortness of breath, cough, or hemoptysis.  She denies any nausea, vomiting, constipation, or diarrhea. She denies any melena or hematochezia. She has no urinary complaints.  Patient offers no further specific complaints today.  REVIEW OF SYSTEMS:   Review of Systems  Constitutional: Positive for malaise/fatigue. Negative for fever and weight loss.  Respiratory: Negative.  Negative for cough and shortness of breath.   Cardiovascular: Negative.  Negative for chest pain and leg swelling.  Gastrointestinal: Negative.  Negative for abdominal pain, blood in stool and melena.  Genitourinary: Negative.  Negative for hematuria.  Musculoskeletal: Negative.  Negative for joint pain.  Skin: Negative.  Negative for rash.  Neurological: Positive for weakness. Negative for dizziness, sensory change, focal weakness and headaches.  Psychiatric/Behavioral: Negative.  The patient is not nervous/anxious and does not have insomnia.     As per HPI. Otherwise, a complete review of systems is negative.  PAST MEDICAL HISTORY: Past Medical History:  Diagnosis Date  . Anemia   . Anginal pain ([place])   . Anxiety   .  Arthritis   . CAD (coronary artery disease)   . Carpal tunnel syndrome    bilateral  . Cataract    bil removed cataracts  . Coronary artery disease    2x stents, Dr. Clayborn Bigness  . Depression   . Dysrhythmia   . Family history of adverse reaction to anesthesia    "brother; S/P lipotripsy in [place]; transferred to [facility]; had to put him on life support for 2 days"  . GERD (gastroesophageal reflux disease)   . Headache    "weekly" (09/13/2014)  . History of blood transfusion 05/2014   "we haven't figured out why I needed it yet" (09/13/2014)  . History of hiatal hernia   . Hyperglycemia   . Hyperlipidemia   . Hypertension   . Myocardial infarct ([place])   . Myocardial infarct, old 1999   15 years ago  . Obesity   . PONV (postoperative nausea and vomiting)   . S/P CABG x 4 09/18/2014   LIMA to LAD, SVG to PDA-dRCA sequentially, SVG to OM, EVH via right thigh   . Sleep apnea   . Sleep apnea    "don't use a mask" (09/13/2014)  . Unstable angina ([place])     PAST SURGICAL HISTORY: Past Surgical History:  Procedure Laterality Date  . ABDOMINAL HYSTERECTOMY    . APPENDECTOMY    . BACK SURGERY    . BLADDER SURGERY    . CARDIAC CATHETERIZATION  06/2014  . CARDIAC CATHETERIZATION N/A 06/16/2016   Procedure: Left Heart Cath and Cors/Grafts Angiography;  Surgeon: Burnell Blanks, MD;  Location: [facility];  Service: Cardiovascular;  Laterality: N/A;  . CARPAL TUNNEL RELEASE Left   . CATARACT EXTRACTION  W/ INTRAOCULAR LENS  IMPLANT, BILATERAL    . CHOLECYSTECTOMY    . COLONOSCOPY    . CORONARY ANGIOPLASTY WITH STENT PLACEMENT  1999   [facility] x2 stent  . CORONARY ARTERY BYPASS GRAFT N/A 09/18/2014   Procedure: CORONARY ARTERY BYPASS GRAFTING (CABG)times four using LIMA  to LAD:SVG to  PD and DIST RCA;SVG to OM;, EVH Right thigh;  Surgeon: [name], MD;  Location: [facility];  Service: Open Heart Surgery;  Laterality: N/A;  . ESOPHAGOGASTRODUODENOSCOPY N/A 09/17/2014    Procedure: ESOPHAGOGASTRODUODENOSCOPY (EGD);  Surgeon: Inda Castle, MD;  Location: [facility];  Service: Endoscopy;  Laterality: N/A;  . heart stents     1999  . INCONTINENCE SURGERY    . INGUINAL HERNIA REPAIR    . INGUINAL HERNIA REPAIR Right X 3   "last one was in the 1990's; still have hernia there now" (09/13/2014)  . LAPAROSCOPIC CHOLECYSTECTOMY    . LUMBAR DISC SURGERY  1990's?  . LUMBAR SPINE SURGERY    . removal of ovaries    . TEE WITHOUT CARDIOVERSION N/A 09/18/2014   Procedure: TRANSESOPHAGEAL ECHOCARDIOGRAM (TEE);  Surgeon: [name], MD;  Location: [facility];  Service: Open Heart Surgery;  Laterality: N/A;  . UPPER GASTROINTESTINAL ENDOSCOPY      FAMILY HISTORY: Family History  Problem Relation Age of Onset  . Coronary artery disease Mother   . Osteoporosis Mother   . Heart disease Mother   . Stroke Mother   . Heart attack Mother   . Hypertension Mother   . Hypertension Father   . Coronary artery disease Father   . COPD Father   . Heart disease Father   . Heart attack Father   . Glaucoma Father   . Osteoporosis Father   . Emphysema Father   . Cancer Sister 19       colon  . Hypertension Sister   . Colon cancer Sister        dx in her 55's  . Breast cancer Sister   . Colon polyps Sister   . Stroke Brother   . Hypertension Brother   . Colon polyps Brother   . Stroke Maternal Grandfather   . Stroke Maternal Grandmother   . Osteoporosis Maternal Grandmother   . Hypertension Maternal Grandmother   . Hypertension Paternal Grandmother   . Hypertension Paternal Grandfather   . Breast cancer Maternal Aunt        2 aunts-50's  . Esophageal cancer Neg Hx   . Stomach cancer Neg Hx   . Rectal cancer Neg Hx   . Pancreatic cancer Neg Hx     ADVANCED DIRECTIVES (Y/N):  N  HEALTH MAINTENANCE: Social History   Tobacco Use  . Smoking status: Never Smoker  . Smokeless tobacco: Never Used  Substance Use Topics  . Alcohol use: Not Currently     Alcohol/week: 0.0 standard drinks  . Drug use: No     Colonoscopy:  PAP:  Bone density:  Lipid panel:  Allergies  Allergen Reactions  . Ferrous Sulfate [Ferrous Fumarate] Other (See Comments)    STOMACH ISSUES  . Lipitor [Atorvastatin] Other (See Comments)    myalgia    Current Outpatient Medications  Medication Sig Dispense Refill  . acetaminophen (TYLENOL) 500 MG tablet Take 500 mg by mouth every 6 (six) hours as needed for headache.    Marland Kitchen aspirin EC 81 MG tablet Take 1 tablet (81 mg total) by mouth daily.    . diazepam (VALIUM)  5 MG tablet Take one tablet (5 mg) on arrival for your MRI (Patient not taking: Reported on 01/16/2019) 1 tablet 0  . lisinopril (ZESTRIL) 5 MG tablet Take 1 tablet (5 mg total) by mouth daily. Please call to schedule appt for future refills. 30 tablet 0  . metoprolol succinate (TOPROL-XL) 50 MG 24 hr tablet TAKE 1 TABLET BY MOUTH ONCE DAILY 30 tablet 3  . pantoprazole (PROTONIX) 40 MG tablet TAKE 1 TABLET BY MOUTH TWICE DAILY 60 tablet 2  . rosuvastatin (CRESTOR) 10 MG tablet TAKE 1 TABLET BY MOUTH ONCE DAILY 30 tablet 2  . sertraline (ZOLOFT) 100 MG tablet TAKE 1 TABLET BY MOUTH ONCE DAILY 90 tablet 0   No current facility-administered medications for this visit.    OBJECTIVE: There were no vitals filed for this visit.   There is no height or weight on file to calculate BMI.    ECOG FS:0 - Asymptomatic  General: Well-developed, well-nourished, no acute distress. Eyes: Pink conjunctiva, anicteric sclera. HEENT: Normocephalic, moist mucous membranes. Lungs: Clear to auscultation bilaterally. Heart: Regular rate and rhythm. No rubs, murmurs, or gallops. Abdomen: Soft, nontender, nondistended. No organomegaly noted, normoactive bowel sounds. Musculoskeletal: No edema, cyanosis, or clubbing. Neuro: Alert, answering all questions appropriately. Cranial nerves grossly intact. Skin: No rashes or petechiae noted. Psych: Normal affect.  LAB  RESULTS:  Lab Results  Component Value Date   NA 139 08/16/2018   K 4.8 08/16/2018   CL 110 08/16/2018   CO2 21 (L) 08/16/2018   GLUCOSE 138 (H) 08/16/2018   BUN 25 (H) 08/16/2018   CREATININE 0.92 08/16/2018   CALCIUM 8.4 (L) 08/16/2018   PROT 6.6 08/16/2018   ALBUMIN 3.6 08/16/2018   AST 22 08/16/2018   ALT 13 08/16/2018   ALKPHOS 47 08/16/2018   BILITOT 0.7 08/16/2018   GFRNONAA >60 08/16/2018   GFRAA >60 08/16/2018    Lab Results  Component Value Date   WBC 7.4 01/16/2019   NEUTROABS 4.8 01/16/2019   HGB 12.8 01/16/2019   HCT 41.6 01/16/2019   MCV 78.8 (L) 01/16/2019   PLT 248 01/16/2019   Lab Results  Component Value Date   IRON 38 01/16/2019   TIBC 481 (H) 01/16/2019   IRONPCTSAT 8 (L) 01/16/2019   Lab Results  Component Value Date   FERRITIN 9 (L) 01/16/2019     STUDIES: No results found.  ASSESSMENT: Iron deficiency anemia.  PLAN:    1. Iron deficiency anemia: Although patient's hemoglobin has trended up and is now within normal limits, her iron stores are significantly reduced and she is symptomatic.  Previously, she reported she cannot tolerate oral iron supplementation. The remainder of her laboratory work is either negative or within normal limits.  Patient had a normal endoscopy in May 2017 and a normal colonoscopy in August 2014.  Patient will likely require repeat colonoscopy in the near future.  Proceed with 510 mg IV Feraheme today.  Return to clinic in 1 week for second infusion.  Patient was then return to clinic in 4 months with repeat laboratory work, further evaluation, consideration of additional IV Feraheme.    I spent a total of 30 minutes face-to-face with the patient of which greater than 50% of the visit was spent in counseling and coordination of care as detailed above.   Patient expressed understanding and was in agreement with this plan. She also understands that She can call clinic at any time with any questions, concerns, or  complaints.  Lloyd Huger, MD   06/03/2019 7:57 AM

## 2019-06-05 ENCOUNTER — Inpatient Hospital Stay: Payer: Medicare Other

## 2019-06-05 ENCOUNTER — Inpatient Hospital Stay: Payer: Medicare Other | Admitting: Oncology

## 2019-06-17 NOTE — Progress Notes (Deleted)
[place]  Telephone:(336) [phone] Fax:(336) [phone]  ID: Debbie Delacruz OB: 02/20/1946  MR#: [id number]  [id number]  Patient Care Team: [name], MD as PCP - General (Family Medicine) [name], MD (Internal Medicine) [name], MD as Consulting Physician (Cardiothoracic Surgery) [name], MD as Consulting Physician (Cardiology)   CHIEF COMPLAINT: Iron deficiency anemia.  INTERVAL HISTORY: Patient returns to clinic today for repeat laboratory work and further evaluation.  She has noticed increased weakness and fatigue over the past several weeks, but otherwise feels well. She has no neurologic complaints. She denies any recent fevers or illnesses. She has a good appetite and denies weight loss.  She denies any chest pain, shortness of breath, cough, or hemoptysis.  She denies any nausea, vomiting, constipation, or diarrhea. She denies any melena or hematochezia. She has no urinary complaints.  Patient offers no further specific complaints today.  REVIEW OF SYSTEMS:   Review of Systems  Constitutional: Positive for malaise/fatigue. Negative for fever and weight loss.  Respiratory: Negative.  Negative for cough and shortness of breath.   Cardiovascular: Negative.  Negative for chest pain and leg swelling.  Gastrointestinal: Negative.  Negative for abdominal pain, blood in stool and melena.  Genitourinary: Negative.  Negative for hematuria.  Musculoskeletal: Negative.  Negative for joint pain.  Skin: Negative.  Negative for rash.  Neurological: Positive for weakness. Negative for dizziness, sensory change, focal weakness and headaches.  Psychiatric/Behavioral: Negative.  The patient is not nervous/anxious and does not have insomnia.     As per HPI. Otherwise, a complete review of systems is negative.  PAST MEDICAL HISTORY: Past Medical History:  Diagnosis Date  . Anemia   . Anginal pain ([place])   . Anxiety   .  Arthritis   . CAD (coronary artery disease)   . Carpal tunnel syndrome    bilateral  . Cataract    bil removed cataracts  . Coronary artery disease    2x stents, Dr. Clayborn Bigness  . Depression   . Dysrhythmia   . Family history of adverse reaction to anesthesia    "brother; S/P lipotripsy in [place]; transferred to [facility]; had to put him on life support for 2 days"  . GERD (gastroesophageal reflux disease)   . Headache    "weekly" (09/13/2014)  . History of blood transfusion 05/2014   "we haven't figured out why I needed it yet" (09/13/2014)  . History of hiatal hernia   . Hyperglycemia   . Hyperlipidemia   . Hypertension   . Myocardial infarct ([place])   . Myocardial infarct, old 1999   15 years ago  . Obesity   . PONV (postoperative nausea and vomiting)   . S/P CABG x 4 09/18/2014   LIMA to LAD, SVG to PDA-dRCA sequentially, SVG to OM, EVH via right thigh   . Sleep apnea   . Sleep apnea    "don't use a mask" (09/13/2014)  . Unstable angina ([place])     PAST SURGICAL HISTORY: Past Surgical History:  Procedure Laterality Date  . ABDOMINAL HYSTERECTOMY    . APPENDECTOMY    . BACK SURGERY    . BLADDER SURGERY    . CARDIAC CATHETERIZATION  06/2014  . CARDIAC CATHETERIZATION N/A 06/16/2016   Procedure: Left Heart Cath and Cors/Grafts Angiography;  Surgeon: Burnell Blanks, MD;  Location: [facility];  Service: Cardiovascular;  Laterality: N/A;  . CARPAL TUNNEL RELEASE Left   . CATARACT EXTRACTION  W/ INTRAOCULAR LENS  IMPLANT, BILATERAL    . CHOLECYSTECTOMY    . COLONOSCOPY    . CORONARY ANGIOPLASTY WITH STENT PLACEMENT  1999   [facility] x2 stent  . CORONARY ARTERY BYPASS GRAFT N/A 09/18/2014   Procedure: CORONARY ARTERY BYPASS GRAFTING (CABG)times four using LIMA  to LAD:SVG to  PD and DIST RCA;SVG to OM;, EVH Right thigh;  Surgeon: [name], MD;  Location: [facility];  Service: Open Heart Surgery;  Laterality: N/A;  . ESOPHAGOGASTRODUODENOSCOPY N/A 09/17/2014    Procedure: ESOPHAGOGASTRODUODENOSCOPY (EGD);  Surgeon: Inda Castle, MD;  Location: [facility];  Service: Endoscopy;  Laterality: N/A;  . heart stents     1999  . INCONTINENCE SURGERY    . INGUINAL HERNIA REPAIR    . INGUINAL HERNIA REPAIR Right X 3   "last one was in the 1990's; still have hernia there now" (09/13/2014)  . LAPAROSCOPIC CHOLECYSTECTOMY    . LUMBAR DISC SURGERY  1990's?  . LUMBAR SPINE SURGERY    . removal of ovaries    . TEE WITHOUT CARDIOVERSION N/A 09/18/2014   Procedure: TRANSESOPHAGEAL ECHOCARDIOGRAM (TEE);  Surgeon: [name], MD;  Location: [facility];  Service: Open Heart Surgery;  Laterality: N/A;  . UPPER GASTROINTESTINAL ENDOSCOPY      FAMILY HISTORY: Family History  Problem Relation Age of Onset  . Coronary artery disease Mother   . Osteoporosis Mother   . Heart disease Mother   . Stroke Mother   . Heart attack Mother   . Hypertension Mother   . Hypertension Father   . Coronary artery disease Father   . COPD Father   . Heart disease Father   . Heart attack Father   . Glaucoma Father   . Osteoporosis Father   . Emphysema Father   . Cancer Sister 108       colon  . Hypertension Sister   . Colon cancer Sister        dx in her 27's  . Breast cancer Sister   . Colon polyps Sister   . Stroke Brother   . Hypertension Brother   . Colon polyps Brother   . Stroke Maternal Grandfather   . Stroke Maternal Grandmother   . Osteoporosis Maternal Grandmother   . Hypertension Maternal Grandmother   . Hypertension Paternal Grandmother   . Hypertension Paternal Grandfather   . Breast cancer Maternal Aunt        2 aunts-50's  . Esophageal cancer Neg Hx   . Stomach cancer Neg Hx   . Rectal cancer Neg Hx   . Pancreatic cancer Neg Hx     ADVANCED DIRECTIVES (Y/N):  N  HEALTH MAINTENANCE: Social History   Tobacco Use  . Smoking status: Never Smoker  . Smokeless tobacco: Never Used  Substance Use Topics  . Alcohol use: Not Currently     Alcohol/week: 0.0 standard drinks  . Drug use: No     Colonoscopy:  PAP:  Bone density:  Lipid panel:  Allergies  Allergen Reactions  . Ferrous Sulfate [Ferrous Fumarate] Other (See Comments)    STOMACH ISSUES  . Lipitor [Atorvastatin] Other (See Comments)    myalgia    Current Outpatient Medications  Medication Sig Dispense Refill  . acetaminophen (TYLENOL) 500 MG tablet Take 500 mg by mouth every 6 (six) hours as needed for headache.    Marland Kitchen aspirin EC 81 MG tablet Take 1 tablet (81 mg total) by mouth daily.    . diazepam (VALIUM)  5 MG tablet Take one tablet (5 mg) on arrival for your MRI (Patient not taking: Reported on 01/16/2019) 1 tablet 0  . lisinopril (ZESTRIL) 5 MG tablet Take 1 tablet (5 mg total) by mouth daily. Please call to schedule appt for future refills. 30 tablet 0  . metoprolol succinate (TOPROL-XL) 50 MG 24 hr tablet TAKE 1 TABLET BY MOUTH ONCE DAILY 30 tablet 3  . pantoprazole (PROTONIX) 40 MG tablet TAKE 1 TABLET BY MOUTH TWICE DAILY 60 tablet 2  . rosuvastatin (CRESTOR) 10 MG tablet TAKE 1 TABLET BY MOUTH ONCE DAILY 30 tablet 2  . sertraline (ZOLOFT) 100 MG tablet TAKE 1 TABLET BY MOUTH ONCE DAILY 90 tablet 0   No current facility-administered medications for this visit.    OBJECTIVE: There were no vitals filed for this visit.   There is no height or weight on file to calculate BMI.    ECOG FS:0 - Asymptomatic  General: Well-developed, well-nourished, no acute distress. Eyes: Pink conjunctiva, anicteric sclera. HEENT: Normocephalic, moist mucous membranes. Lungs: Clear to auscultation bilaterally. Heart: Regular rate and rhythm. No rubs, murmurs, or gallops. Abdomen: Soft, nontender, nondistended. No organomegaly noted, normoactive bowel sounds. Musculoskeletal: No edema, cyanosis, or clubbing. Neuro: Alert, answering all questions appropriately. Cranial nerves grossly intact. Skin: No rashes or petechiae noted. Psych: Normal affect.  LAB  RESULTS:  Lab Results  Component Value Date   NA 139 08/16/2018   K 4.8 08/16/2018   CL 110 08/16/2018   CO2 21 (L) 08/16/2018   GLUCOSE 138 (H) 08/16/2018   BUN 25 (H) 08/16/2018   CREATININE 0.92 08/16/2018   CALCIUM 8.4 (L) 08/16/2018   PROT 6.6 08/16/2018   ALBUMIN 3.6 08/16/2018   AST 22 08/16/2018   ALT 13 08/16/2018   ALKPHOS 47 08/16/2018   BILITOT 0.7 08/16/2018   GFRNONAA >60 08/16/2018   GFRAA >60 08/16/2018    Lab Results  Component Value Date   WBC 7.4 01/16/2019   NEUTROABS 4.8 01/16/2019   HGB 12.8 01/16/2019   HCT 41.6 01/16/2019   MCV 78.8 (L) 01/16/2019   PLT 248 01/16/2019   Lab Results  Component Value Date   IRON 38 01/16/2019   TIBC 481 (H) 01/16/2019   IRONPCTSAT 8 (L) 01/16/2019   Lab Results  Component Value Date   FERRITIN 9 (L) 01/16/2019     STUDIES: No results found.  ASSESSMENT: Iron deficiency anemia.  PLAN:    1. Iron deficiency anemia: Although patient's hemoglobin has trended up and is now within normal limits, her iron stores are significantly reduced and she is symptomatic.  Previously, she reported she cannot tolerate oral iron supplementation. The remainder of her laboratory work is either negative or within normal limits.  Patient had a normal endoscopy in May 2017 and a normal colonoscopy in August 2014.  Patient will likely require repeat colonoscopy in the near future.  Proceed with 510 mg IV Feraheme today.  Return to clinic in 1 week for second infusion.  Patient was then return to clinic in 4 months with repeat laboratory work, further evaluation, consideration of additional IV Feraheme.    I spent a total of 30 minutes face-to-face with the patient of which greater than 50% of the visit was spent in counseling and coordination of care as detailed above.   Patient expressed understanding and was in agreement with this plan. She also understands that She can call clinic at any time with any questions, concerns, or  complaints.  Lloyd Huger, MD   06/17/2019 11:24 AM

## 2019-06-23 ENCOUNTER — Inpatient Hospital Stay: Payer: Medicare Other | Admitting: Oncology

## 2019-06-23 ENCOUNTER — Inpatient Hospital Stay: Payer: Medicare Other

## 2019-07-13 NOTE — Progress Notes (Signed)
[place]  Telephone:(336) [phone] Fax:(336) [phone]  ID: Debbie Delacruz OB: May 14, 1946  MR#: [id number]  [id number]  Patient Care Team: [name], MD as PCP - General (Family Medicine) [name], MD (Internal Medicine) [name], MD as Consulting Physician (Cardiothoracic Surgery) [name], MD as Consulting Physician (Cardiology)   CHIEF COMPLAINT: Iron deficiency anemia.  INTERVAL HISTORY: Patient returns to clinic today for repeat laboratory work, further evaluation, and consideration of additional IV Feraheme.  She currently feels well and is asymptomatic.  She does not complain of any weakness or fatigue. She has no neurologic complaints. She denies any recent fevers or illnesses. She has a good appetite and denies weight loss.  She denies any chest pain, shortness of breath, cough, or hemoptysis.  She denies any nausea, vomiting, constipation, or diarrhea. She denies any melena or hematochezia. She has no urinary complaints.  Patient offers no specific complaints today.  REVIEW OF SYSTEMS:   Review of Systems  Constitutional: Negative.  Negative for fever, malaise/fatigue and weight loss.  Respiratory: Negative.  Negative for cough and shortness of breath.   Cardiovascular: Negative.  Negative for chest pain and leg swelling.  Gastrointestinal: Negative.  Negative for abdominal pain, blood in stool and melena.  Genitourinary: Negative.  Negative for hematuria.  Musculoskeletal: Negative.  Negative for joint pain.  Skin: Negative.  Negative for rash.  Neurological: Negative.  Negative for dizziness, sensory change, focal weakness, weakness and headaches.  Psychiatric/Behavioral: Negative.  The patient is not nervous/anxious and does not have insomnia.     As per HPI. Otherwise, a complete review of systems is negative.  PAST MEDICAL HISTORY: Past Medical History:  Diagnosis Date  . Anemia   . Anginal pain  ([place])   . Anxiety   . Arthritis   . CAD (coronary artery disease)   . Carpal tunnel syndrome    bilateral  . Cataract    bil removed cataracts  . Coronary artery disease    2x stents, Dr. Clayborn Bigness  . Depression   . Dysrhythmia   . Family history of adverse reaction to anesthesia    "brother; S/P lipotripsy in [place]; transferred to [facility]; had to put him on life support for 2 days"  . GERD (gastroesophageal reflux disease)   . Headache    "weekly" (09/13/2014)  . History of blood transfusion 05/2014   "we haven't figured out why I needed it yet" (09/13/2014)  . History of hiatal hernia   . Hyperglycemia   . Hyperlipidemia   . Hypertension   . Myocardial infarct ([place])   . Myocardial infarct, old 1999   15 years ago  . Obesity   . PONV (postoperative nausea and vomiting)   . S/P CABG x 4 09/18/2014   LIMA to LAD, SVG to PDA-dRCA sequentially, SVG to OM, EVH via right thigh   . Sleep apnea   . Sleep apnea    "don't use a mask" (09/13/2014)  . Unstable angina ([place])     PAST SURGICAL HISTORY: Past Surgical History:  Procedure Laterality Date  . ABDOMINAL HYSTERECTOMY    . APPENDECTOMY    . BACK SURGERY    . BLADDER SURGERY    . CARDIAC CATHETERIZATION  06/2014  . CARDIAC CATHETERIZATION N/A 06/16/2016   Procedure: Left Heart Cath and Cors/Grafts Angiography;  Surgeon: Burnell Blanks, MD;  Location: [facility];  Service: Cardiovascular;  Laterality: N/A;  . CARPAL TUNNEL RELEASE Left   .  CATARACT EXTRACTION W/ INTRAOCULAR LENS  IMPLANT, BILATERAL    . CHOLECYSTECTOMY    . COLONOSCOPY    . CORONARY ANGIOPLASTY WITH STENT PLACEMENT  1999   [facility] x2 stent  . CORONARY ARTERY BYPASS GRAFT N/A 09/18/2014   Procedure: CORONARY ARTERY BYPASS GRAFTING (CABG)times four using LIMA  to LAD:SVG to  PD and DIST RCA;SVG to OM;, EVH Right thigh;  Surgeon: [name], MD;  Location: [facility];  Service: Open Heart Surgery;  Laterality: N/A;  .  ESOPHAGOGASTRODUODENOSCOPY N/A 09/17/2014   Procedure: ESOPHAGOGASTRODUODENOSCOPY (EGD);  Surgeon: Inda Castle, MD;  Location: [facility];  Service: Endoscopy;  Laterality: N/A;  . heart stents     1999  . INCONTINENCE SURGERY    . INGUINAL HERNIA REPAIR    . INGUINAL HERNIA REPAIR Right X 3   "last one was in the 1990's; still have hernia there now" (09/13/2014)  . LAPAROSCOPIC CHOLECYSTECTOMY    . LUMBAR DISC SURGERY  1990's?  . LUMBAR SPINE SURGERY    . removal of ovaries    . TEE WITHOUT CARDIOVERSION N/A 09/18/2014   Procedure: TRANSESOPHAGEAL ECHOCARDIOGRAM (TEE);  Surgeon: [name], MD;  Location: [facility];  Service: Open Heart Surgery;  Laterality: N/A;  . UPPER GASTROINTESTINAL ENDOSCOPY      FAMILY HISTORY: Family History  Problem Relation Age of Onset  . Coronary artery disease Mother   . Osteoporosis Mother   . Heart disease Mother   . Stroke Mother   . Heart attack Mother   . Hypertension Mother   . Hypertension Father   . Coronary artery disease Father   . COPD Father   . Heart disease Father   . Heart attack Father   . Glaucoma Father   . Osteoporosis Father   . Emphysema Father   . Cancer Sister 60       colon  . Hypertension Sister   . Colon cancer Sister        dx in her 14's  . Breast cancer Sister   . Colon polyps Sister   . Stroke Brother   . Hypertension Brother   . Colon polyps Brother   . Stroke Maternal Grandfather   . Stroke Maternal Grandmother   . Osteoporosis Maternal Grandmother   . Hypertension Maternal Grandmother   . Hypertension Paternal Grandmother   . Hypertension Paternal Grandfather   . Breast cancer Maternal Aunt        2 aunts-50's  . Esophageal cancer Neg Hx   . Stomach cancer Neg Hx   . Rectal cancer Neg Hx   . Pancreatic cancer Neg Hx     ADVANCED DIRECTIVES (Y/N):  N  HEALTH MAINTENANCE: Social History   Tobacco Use  . Smoking status: Never Smoker  . Smokeless tobacco: Never Used  Substance Use Topics   . Alcohol use: Not Currently    Alcohol/week: 0.0 standard drinks  . Drug use: No     Colonoscopy:  PAP:  Bone density:  Lipid panel:  Allergies  Allergen Reactions  . Ferrous Sulfate [Ferrous Fumarate] Other (See Comments)    STOMACH ISSUES  . Lipitor [Atorvastatin] Other (See Comments)    myalgia    Current Outpatient Medications  Medication Sig Dispense Refill  . acetaminophen (TYLENOL) 500 MG tablet Take 500 mg by mouth every 6 (six) hours as needed for headache.    Marland Kitchen aspirin EC 81 MG tablet Take 1 tablet (81 mg total) by mouth daily.    Marland Kitchen  isosorbide mononitrate (IMDUR) 30 MG 24 hr tablet Take 1 tablet by mouth daily.    Marland Kitchen lisinopril (ZESTRIL) 5 MG tablet Take 1 tablet (5 mg total) by mouth daily. Please call to schedule appt for future refills. 30 tablet 0  . metoprolol succinate (TOPROL-XL) 50 MG 24 hr tablet TAKE 1 TABLET BY MOUTH ONCE DAILY 30 tablet 3  . rosuvastatin (CRESTOR) 10 MG tablet TAKE 1 TABLET BY MOUTH ONCE DAILY 30 tablet 2  . diazepam (VALIUM) 5 MG tablet Take one tablet (5 mg) on arrival for your MRI (Patient not taking: Reported on 01/16/2019) 1 tablet 0  . pantoprazole (PROTONIX) 40 MG tablet TAKE 1 TABLET BY MOUTH TWICE DAILY (Patient not taking: Reported on 07/18/2019) 60 tablet 2  . sertraline (ZOLOFT) 100 MG tablet TAKE 1 TABLET BY MOUTH ONCE DAILY (Patient not taking: Reported on 07/18/2019) 90 tablet 0   No current facility-administered medications for this visit.    OBJECTIVE: Vitals:   07/18/19 1317 07/18/19 1323  BP: (!) 182/102 (!) 187/100  Pulse: 71   Resp: 18   Temp: (!) 97.1 F (36.2 C)      Body mass index is 33.84 kg/m.    ECOG FS:0 - Asymptomatic  General: Well-developed, well-nourished, no acute distress. Eyes: Pink conjunctiva, anicteric sclera. HEENT: Normocephalic, moist mucous membranes. Lungs: No audible wheezing or coughing. Heart: Regular rate and rhythm. Abdomen: Soft, nontender, no obvious distention. Musculoskeletal:  No edema, cyanosis, or clubbing. Neuro: Alert, answering all questions appropriately. Cranial nerves grossly intact. Skin: No rashes or petechiae noted. Psych: Normal affect.  LAB RESULTS:  Lab Results  Component Value Date   NA 139 08/16/2018   K 4.8 08/16/2018   CL 110 08/16/2018   CO2 21 (L) 08/16/2018   GLUCOSE 138 (H) 08/16/2018   BUN 25 (H) 08/16/2018   CREATININE 0.92 08/16/2018   CALCIUM 8.4 (L) 08/16/2018   PROT 6.6 08/16/2018   ALBUMIN 3.6 08/16/2018   AST 22 08/16/2018   ALT 13 08/16/2018   ALKPHOS 47 08/16/2018   BILITOT 0.7 08/16/2018   GFRNONAA >60 08/16/2018   GFRAA >60 08/16/2018    Lab Results  Component Value Date   WBC 7.1 07/18/2019   NEUTROABS 4.1 07/18/2019   HGB 15.1 (H) 07/18/2019   HCT 46.3 (H) 07/18/2019   MCV 94.5 07/18/2019   PLT 229 07/18/2019   Lab Results  Component Value Date   IRON 65 07/18/2019   TIBC 414 07/18/2019   IRONPCTSAT 16 07/18/2019   Lab Results  Component Value Date   FERRITIN 24 07/18/2019     STUDIES: No results found.  ASSESSMENT: Iron deficiency anemia.  PLAN:    1. Iron deficiency anemia: Patient's hemoglobin and iron stores are within normal limits today. Previously, she reported she cannot tolerate oral iron supplementation. The remainder of her laboratory work is either negative or within normal limits.  Patient had a normal endoscopy in May 2017 and a normal colonoscopy in August 2014.  Patient will likely require repeat colonoscopy in the near future.  She does not require additional IV Feraheme today.  Return to clinic in 4 months with repeat laboratory work, further evaluation, and consideration of additional treatment. 2.  Hypertension: Patient's blood pressure is significantly elevated today.  Continue monitoring and treatment per primary care.   Patient expressed understanding and was in agreement with this plan. She also understands that She can call clinic at any time with any questions,  concerns, or complaints.  Lloyd Huger, MD   07/21/2019 5:37 AM

## 2019-07-16 ENCOUNTER — Ambulatory Visit: Payer: Medicare Other | Attending: Internal Medicine

## 2019-07-16 ENCOUNTER — Other Ambulatory Visit: Payer: Self-pay

## 2019-07-16 DIAGNOSIS — Z23 Encounter for immunization: Secondary | ICD-10-CM

## 2019-07-16 NOTE — Progress Notes (Signed)
   Covid-19 Vaccination Clinic  Name:  Debbie Delacruz    MRN: [id number] DOB: [birth year]  07/16/2019  [name] was observed post Covid-19 immunization for 15 minutes without incidence. She was provided with Vaccine Information Sheet and instruction to access the V-Safe system.   [name] was instructed to call 911 with any severe reactions post vaccine: Marland Kitchen Difficulty breathing  . Swelling of your face and throat  . A fast heartbeat  . A bad rash all over your body  . Dizziness and weakness    Immunizations Administered    Name Date Dose VIS Date Route   Pfizer COVID-19 Vaccine 07/16/2019  9:15 AM 0.3 mL 05/05/2019 Intramuscular   Manufacturer: [place]   Lot: [id number]   [place]: [id number]

## 2019-07-17 DIAGNOSIS — E785 Hyperlipidemia, unspecified: Secondary | ICD-10-CM | POA: Insufficient documentation

## 2019-07-18 ENCOUNTER — Inpatient Hospital Stay: Payer: Medicare Other | Attending: Oncology | Admitting: Oncology

## 2019-07-18 ENCOUNTER — Encounter: Payer: Self-pay | Admitting: Oncology

## 2019-07-18 ENCOUNTER — Inpatient Hospital Stay: Payer: Medicare Other

## 2019-07-18 ENCOUNTER — Other Ambulatory Visit: Payer: Self-pay

## 2019-07-18 VITALS — BP 187/100 | HR 71 | Temp 97.1°F | Resp 18 | Wt 188.0 lb

## 2019-07-18 DIAGNOSIS — G4730 Sleep apnea, unspecified: Secondary | ICD-10-CM | POA: Insufficient documentation

## 2019-07-18 DIAGNOSIS — Z79899 Other long term (current) drug therapy: Secondary | ICD-10-CM | POA: Diagnosis not present

## 2019-07-18 DIAGNOSIS — D509 Iron deficiency anemia, unspecified: Secondary | ICD-10-CM | POA: Diagnosis present

## 2019-07-18 DIAGNOSIS — I252 Old myocardial infarction: Secondary | ICD-10-CM | POA: Insufficient documentation

## 2019-07-18 DIAGNOSIS — I10 Essential (primary) hypertension: Secondary | ICD-10-CM | POA: Diagnosis not present

## 2019-07-18 DIAGNOSIS — F418 Other specified anxiety disorders: Secondary | ICD-10-CM | POA: Diagnosis not present

## 2019-07-18 DIAGNOSIS — E669 Obesity, unspecified: Secondary | ICD-10-CM | POA: Diagnosis not present

## 2019-07-18 DIAGNOSIS — Z7982 Long term (current) use of aspirin: Secondary | ICD-10-CM | POA: Diagnosis not present

## 2019-07-18 DIAGNOSIS — I2510 Atherosclerotic heart disease of native coronary artery without angina pectoris: Secondary | ICD-10-CM | POA: Insufficient documentation

## 2019-07-18 DIAGNOSIS — K219 Gastro-esophageal reflux disease without esophagitis: Secondary | ICD-10-CM | POA: Insufficient documentation

## 2019-07-18 DIAGNOSIS — E785 Hyperlipidemia, unspecified: Secondary | ICD-10-CM | POA: Insufficient documentation

## 2019-07-18 LAB — IRON AND TIBC
Iron: 65 ug/dL (ref 28–170)
Saturation Ratios: 16 % (ref 10.4–31.8)
TIBC: 414 ug/dL (ref 250–450)
UIBC: 349 ug/dL

## 2019-07-18 LAB — CBC WITH DIFFERENTIAL/PLATELET
Abs Immature Granulocytes: 0.01 10*3/uL (ref 0.00–0.07)
Basophils Absolute: 0 10*3/uL (ref 0.0–0.1)
Basophils Relative: 1 %
Eosinophils Absolute: 0.2 10*3/uL (ref 0.0–0.5)
Eosinophils Relative: 3 %
HCT: 46.3 % — ABNORMAL HIGH (ref 36.0–46.0)
Hemoglobin: 15.1 g/dL — ABNORMAL HIGH (ref 12.0–15.0)
Immature Granulocytes: 0 %
Lymphocytes Relative: 28 %
Lymphs Abs: 2 10*3/uL (ref 0.7–4.0)
MCH: 30.8 pg (ref 26.0–34.0)
MCHC: 32.6 g/dL (ref 30.0–36.0)
MCV: 94.5 fL (ref 80.0–100.0)
Monocytes Absolute: 0.7 10*3/uL (ref 0.1–1.0)
Monocytes Relative: 10 %
Neutro Abs: 4.1 10*3/uL (ref 1.7–7.7)
Neutrophils Relative %: 58 %
Platelets: 229 10*3/uL (ref 150–400)
RBC: 4.9 MIL/uL (ref 3.87–5.11)
RDW: 14 % (ref 11.5–15.5)
WBC: 7.1 10*3/uL (ref 4.0–10.5)
nRBC: 0 % (ref 0.0–0.2)

## 2019-07-18 LAB — FERRITIN: Ferritin: 24 ng/mL (ref 11–307)

## 2019-07-19 ENCOUNTER — Telehealth: Payer: Self-pay | Admitting: Family Medicine

## 2019-07-19 ENCOUNTER — Encounter: Payer: Self-pay | Admitting: Oncology

## 2019-07-19 NOTE — Telephone Encounter (Signed)
Called the patient and left a vm for the patient to call back.  Patient's BP was high yesterday, 182/100. Patient checks her bp at [facility]. No available appointments this week and pt can not do an appointment this week. She was wondering if she should double up on bp medication.  Shandee Jergens,cma

## 2019-07-19 NOTE — Telephone Encounter (Signed)
She needs to check with a home BP cuff and let us know the readings. The monitors at [facility] or other pharmacies are notoriously inaccurate. If elevated on home BP cuff I can give further recommendations.

## 2019-07-19 NOTE — Telephone Encounter (Signed)
I called and spoke to the patient and she stated she is going to purchase a BP monitor today and I informed her that the provider stated once she receives the BP monitor take  couple of readings and send them through my chart and then he will give recommendations, because the provider stated the BP monitors at [facility] are not accurate.  Jakin Pavao,cma

## 2019-07-19 NOTE — Telephone Encounter (Signed)
Patient's BP was high yesterday, 182/100. Patient checks her bp at [facility]. No available appointments this week and pt can not do an appointment this week. She was wondering if she should double up on bp medication.

## 2019-07-24 ENCOUNTER — Ambulatory Visit (INDEPENDENT_AMBULATORY_CARE_PROVIDER_SITE_OTHER): Payer: Medicare Other | Admitting: Family Medicine

## 2019-07-24 ENCOUNTER — Other Ambulatory Visit: Payer: Self-pay

## 2019-07-24 ENCOUNTER — Encounter: Payer: Self-pay | Admitting: Family Medicine

## 2019-07-24 VITALS — BP 160/100 | HR 71 | Temp 95.9°F | Ht 62.0 in | Wt 187.4 lb

## 2019-07-24 DIAGNOSIS — R7303 Prediabetes: Secondary | ICD-10-CM | POA: Diagnosis not present

## 2019-07-24 DIAGNOSIS — I10 Essential (primary) hypertension: Secondary | ICD-10-CM | POA: Diagnosis not present

## 2019-07-24 DIAGNOSIS — E7800 Pure hypercholesterolemia, unspecified: Secondary | ICD-10-CM | POA: Diagnosis not present

## 2019-07-24 DIAGNOSIS — F419 Anxiety disorder, unspecified: Secondary | ICD-10-CM

## 2019-07-24 MED ORDER — HYDROXYZINE HCL 10 MG PO TABS: 10.0000 mg | ORAL_TABLET | Freq: Three times a day (TID) | ORAL | 0 refills | Status: DC | PRN

## 2019-07-24 NOTE — Patient Instructions (Signed)
Nice to see you. We will get lab work today and then will increase your blood pressure medication. Try the hydroxyzine for anxiety.  Please try to take this at home the first time you take it in case it makes you drowsy.  Please do not drive if it makes you drowsy.

## 2019-07-25 LAB — LIPID PANEL
Cholesterol: 267 mg/dL — ABNORMAL HIGH (ref 0–200)
HDL: 40.7 mg/dL (ref >39.00)
LDL Cholesterol: 188 mg/dL — ABNORMAL HIGH (ref 0–99)
NonHDL: 226.27
Total CHOL/HDL Ratio: 7
Triglycerides: 191 mg/dL — ABNORMAL HIGH (ref 0.0–149.0)
VLDL: 38.2 mg/dL (ref 0.0–40.0)

## 2019-07-25 LAB — COMPREHENSIVE METABOLIC PANEL
ALT: 15 U/L (ref 0–35)
AST: 18 U/L (ref 0–37)
Albumin: 4.2 g/dL (ref 3.5–5.2)
Alkaline Phosphatase: 55 U/L (ref 39–117)
BUN: 21 mg/dL (ref 6–23)
CO2: 26 mEq/L (ref 19–32)
Calcium: 9.9 mg/dL (ref 8.4–10.5)
Chloride: 103 mEq/L (ref 96–112)
Creatinine, Ser: 1.05 mg/dL (ref 0.40–1.20)
GFR: 51.56 mL/min — ABNORMAL LOW (ref >60.00)
Glucose, Bld: 89 mg/dL (ref 70–99)
Potassium: 4.9 mEq/L (ref 3.5–5.1)
Sodium: 138 mEq/L (ref 135–145)
Total Bilirubin: 0.4 mg/dL (ref 0.2–1.2)
Total Protein: 7.4 g/dL (ref 6.0–8.3)

## 2019-07-25 LAB — HEMOGLOBIN A1C: Hgb A1c MFr Bld: 5.8 % (ref 4.6–6.5)

## 2019-07-25 LAB — TSH: TSH: 2.02 u[IU]/mL (ref 0.35–4.50)

## 2019-07-25 NOTE — Progress Notes (Signed)
Debbie Rumps, MD Phone: [phone]  Debbie Delacruz is a 74 y.o. female who presents today for follow-up.  Hypertension: BP has been elevated recently. The highest it has been is 176/103 and the lowest it has been is 121/72. She continues on lisinopril, Imdur, and metoprolol. No chest pain, shortness of breath, or edema.  Hyperlipidemia: Taking Crestor. No right upper quadrant pain or myalgias.  Anxiety: Typically occurs mostly while she is at work. She works in a [employer]. She is ready to retire though does not think she will be able to. No depression. She will get nervous and sweaty when she gets anxious. She walks away to make it feel better. She wonders about a medicine such as Xanax for this.  Social History   Tobacco Use  Smoking Status Never Smoker  Smokeless Tobacco Never Used     ROS see history of present illness  Objective  Physical Exam Vitals:   07/24/19 1616  BP: (!) 160/100  Pulse: 71  Temp: (!) 95.9 F (35.5 C)  SpO2: 98%    BP Readings from Last 3 Encounters:  07/24/19 (!) 160/100  07/18/19 (!) 187/100  05/08/19 (!) 158/90   Wt Readings from Last 3 Encounters:  07/24/19 187 lb 6.4 oz (85 kg)  07/18/19 188 lb (85.3 kg)  01/16/19 187 lb 8 oz (85 kg)    Physical Exam Constitutional:      General: She is not in acute distress.    Appearance: She is not diaphoretic.  Cardiovascular:     Rate and Rhythm: Normal rate and regular rhythm.     Heart sounds: Normal heart sounds.  Pulmonary:     Effort: Pulmonary effort is normal.     Breath sounds: Normal breath sounds.  Musculoskeletal:     Right lower leg: No edema.     Left lower leg: No edema.  Skin:    General: Skin is warm and dry.  Neurological:     Mental Status: She is alert.      Assessment/Plan: Please see individual problem list.  Hypertension BP is elevated. We need to get lab work first and then we can consider increasing her lisinopril. She will continue her other  medications. We will contact her when her labs return.  Anxiety Typically occurs while she is at work. Discussed that we have trended away from Xanax and similar medicines given risk for abuse and dependence. We will trial hydroxyzine. Advised that this could make her drowsy and she should try it at home first to ensure that it does not make her excessively drowsy while at work. Advised if she is drowsy with this she should not drive.  Hyperlipidemia Continue Crestor. Check labs.   Health Maintenance: Plan to discuss colonoscopy at next visit.  Orders Placed This Encounter  Procedures  . Comp Met (CMET)  . Lipid panel  . HgB A1c  . TSH  . Basic Metabolic Panel (BMET)    Standing Status:   Future    Standing Expiration Date:   07/23/2020    Meds ordered this encounter  Medications  . hydrOXYzine (ATARAX/VISTARIL) 10 MG tablet    Sig: Take 1 tablet (10 mg total) by mouth 3 (three) times daily as needed for anxiety.    Dispense:  30 tablet    Refill:  0    This visit occurred during the SARS-CoV-2 public health emergency.  Safety protocols were in place, including screening questions prior to the visit, additional usage of staff PPE,  and extensive cleaning of exam room while observing appropriate contact time as indicated for disinfecting solutions.    Debbie Rumps, MD [place]

## 2019-07-25 NOTE — Assessment & Plan Note (Signed)
Typically occurs while she is at work. Discussed that we have trended away from Xanax and similar medicines given risk for abuse and dependence. We will trial hydroxyzine. Advised that this could make her drowsy and she should try it at home first to ensure that it does not make her excessively drowsy while at work. Advised if she is drowsy with this she should not drive.

## 2019-07-25 NOTE — Assessment & Plan Note (Signed)
BP is elevated. We need to get lab work first and then we can consider increasing her lisinopril. She will continue her other medications. We will contact her when her labs return.

## 2019-07-25 NOTE — Assessment & Plan Note (Signed)
Continue Crestor. Check labs. 

## 2019-07-26 ENCOUNTER — Telehealth: Payer: Self-pay | Admitting: Family Medicine

## 2019-07-26 DIAGNOSIS — I10 Essential (primary) hypertension: Secondary | ICD-10-CM

## 2019-07-26 MED ORDER — LISINOPRIL 10 MG PO TABS: 10.0000 mg | ORAL_TABLET | Freq: Every day | ORAL | 1 refills | Status: DC

## 2019-07-26 MED ORDER — AMLODIPINE BESYLATE 5 MG PO TABS: 5.0000 mg | ORAL_TABLET | Freq: Every day | ORAL | 3 refills | Status: DC

## 2019-07-26 NOTE — Telephone Encounter (Signed)
I called the patient and informed her that she needed to be started on a higher dose of lisinopril and also amlodipine and the patient understood and agreed to take the medication. She already has a lab appt.  Karielle Davidow,cma

## 2019-07-26 NOTE — Telephone Encounter (Signed)
Pt has questions about lisinopril (ZESTRIL) 5 MG tablet. Pt states that the pharmacy had not changed the dosage when she picked it up yesterday. Please call her back to clarify

## 2019-07-26 NOTE — Telephone Encounter (Signed)
Spoke to patient. Informed her of what was stated in chart. Dr Caryl Bis has not reviewed labs and has not sent higher dose in as of yet. Patient would like a call with results and when /if  higher dose of BP medication is sent. Patient stated her BP is high again today aswell.

## 2019-07-26 NOTE — Addendum Note (Signed)
Addended by: Caryl Bis Kyzen Horn G on: 07/26/2019 04:10 PM   Modules accepted: Orders

## 2019-07-26 NOTE — Telephone Encounter (Signed)
Please let the patient know that her lab work did not reveal a cause for her elevated BP. Her kidney function was slightly worse than >1 year ago, though it is in an adequate range that we could try a higher dose of her lisinopril. We will increase the lisinopril to 10 mg daily. Given where her BP was it would be a good idea to add on another medication as well. I would like to add amlodipine 5 mg once daily. I have sent these to her pharmacy. She needs labs 7-10 days after starting the higher dose of lisinopril. Labs ordered.

## 2019-07-26 NOTE — Telephone Encounter (Signed)
Spoke to patient. Informed her of what was stated in chart. Dr Caryl Bis has not reviewed labs and has not sent higher dose in as of yet. Patient would like a call with results and when /if  higher dose of BP medication is sent. Patient stated her BP is high again today as well.  Mattix Imhof,cma

## 2019-07-27 ENCOUNTER — Other Ambulatory Visit: Payer: Self-pay | Admitting: Family Medicine

## 2019-07-27 MED ORDER — ROSUVASTATIN CALCIUM 40 MG PO TABS: 40.0000 mg | ORAL_TABLET | Freq: Every day | ORAL | 1 refills | Status: DC

## 2019-07-31 ENCOUNTER — Other Ambulatory Visit: Payer: Medicare Other

## 2019-08-01 ENCOUNTER — Other Ambulatory Visit: Payer: Medicare Other

## 2019-08-02 ENCOUNTER — Telehealth: Payer: Self-pay | Admitting: Family Medicine

## 2019-08-02 ENCOUNTER — Other Ambulatory Visit: Payer: Medicare Other

## 2019-08-02 NOTE — Telephone Encounter (Signed)
Patient dropped off BP readings. Readings are upfront in Dr Sonnenberg's color folder. 

## 2019-08-02 NOTE — Telephone Encounter (Signed)
I picked up the BP reading from front and put in lab basket for review.  Lynde Ludwig,cma

## 2019-08-03 ENCOUNTER — Telehealth: Payer: Self-pay

## 2019-08-03 ENCOUNTER — Other Ambulatory Visit: Payer: Medicare Other

## 2019-08-04 NOTE — Telephone Encounter (Signed)
LVM for the patient to call me back.  Harlan Vinal,cma  

## 2019-08-04 NOTE — Telephone Encounter (Signed)
Patient returned a call back to me and I informed her that the provider wanted BP readings from the last 3 days, she stated she took her BP and when she gets home she will send those reading through mychart to the provider.  Kenard Morawski,cma

## 2019-08-04 NOTE — Telephone Encounter (Signed)
Her BP seems to be better over the last 3 days that she reported. Please see if she has been checking over the past 2-3 days and if she can give me her readings for those days I can figure out if we need to make changes to her meds.

## 2019-08-04 NOTE — Telephone Encounter (Signed)
Noted. Will review when I am back in the office.

## 2019-08-07 ENCOUNTER — Encounter: Payer: Self-pay | Admitting: Family Medicine

## 2019-08-08 ENCOUNTER — Other Ambulatory Visit: Payer: Medicare Other

## 2019-08-09 ENCOUNTER — Ambulatory Visit: Payer: Medicare Other | Attending: Internal Medicine

## 2019-08-09 DIAGNOSIS — Z23 Encounter for immunization: Secondary | ICD-10-CM

## 2019-08-09 NOTE — Progress Notes (Signed)
   Covid-19 Vaccination Clinic  Name:  Solmayra Gillan    MRN: [id number] DOB: 1945-08-18  08/09/2019  Ms. Mo was observed post Covid-19 immunization for 15 minutes without incident. She was provided with Vaccine Information Sheet and instruction to access the V-Safe system.   Ms. Veeneman was instructed to call 911 with any severe reactions post vaccine: Marland Kitchen Difficulty breathing  . Swelling of face and throat  . A fast heartbeat  . A bad rash all over body  . Dizziness and weakness   Immunizations Administered    Name Date Dose VIS Date Route   Pfizer COVID-19 Vaccine 08/09/2019  8:10 AM 0.3 mL 05/05/2019 Intramuscular   Manufacturer: [employer]   Lot: [id number]   Onset: [id number]

## 2019-08-16 ENCOUNTER — Other Ambulatory Visit: Payer: Self-pay

## 2019-08-16 ENCOUNTER — Other Ambulatory Visit (INDEPENDENT_AMBULATORY_CARE_PROVIDER_SITE_OTHER): Payer: Medicare Other

## 2019-08-16 DIAGNOSIS — I10 Essential (primary) hypertension: Secondary | ICD-10-CM | POA: Diagnosis not present

## 2019-08-16 LAB — BASIC METABOLIC PANEL
BUN: 20 mg/dL (ref 6–23)
CO2: 27 mEq/L (ref 19–32)
Calcium: 9.2 mg/dL (ref 8.4–10.5)
Chloride: 105 mEq/L (ref 96–112)
Creatinine, Ser: 0.88 mg/dL (ref 0.40–1.20)
GFR: 63.21 mL/min (ref >60.00)
Glucose, Bld: 112 mg/dL — ABNORMAL HIGH (ref 70–99)
Potassium: 4.9 mEq/L (ref 3.5–5.1)
Sodium: 138 mEq/L (ref 135–145)

## 2019-08-23 ENCOUNTER — Ambulatory Visit: Payer: Medicare Other | Attending: Internal Medicine

## 2019-08-30 ENCOUNTER — Telehealth: Payer: Self-pay

## 2019-08-30 ENCOUNTER — Other Ambulatory Visit: Payer: Self-pay

## 2019-08-30 ENCOUNTER — Encounter: Payer: Self-pay | Admitting: Family Medicine

## 2019-08-30 ENCOUNTER — Telehealth (INDEPENDENT_AMBULATORY_CARE_PROVIDER_SITE_OTHER): Payer: Medicare Other | Admitting: Family Medicine

## 2019-08-30 VITALS — Ht 62.0 in | Wt 180.0 lb

## 2019-08-30 DIAGNOSIS — I2510 Atherosclerotic heart disease of native coronary artery without angina pectoris: Secondary | ICD-10-CM | POA: Diagnosis not present

## 2019-08-30 DIAGNOSIS — Z1231 Encounter for screening mammogram for malignant neoplasm of breast: Secondary | ICD-10-CM | POA: Diagnosis not present

## 2019-08-30 DIAGNOSIS — Z780 Asymptomatic menopausal state: Secondary | ICD-10-CM

## 2019-08-30 DIAGNOSIS — E669 Obesity, unspecified: Secondary | ICD-10-CM | POA: Insufficient documentation

## 2019-08-30 DIAGNOSIS — Z20822 Contact with and (suspected) exposure to covid-19: Secondary | ICD-10-CM

## 2019-08-30 DIAGNOSIS — E785 Hyperlipidemia, unspecified: Secondary | ICD-10-CM

## 2019-08-30 MED ORDER — TETANUS-DIPHTHERIA TOXOIDS TD 5-2 LFU IM INJ: 0.5000 mL | INJECTION | Freq: Once | INTRAMUSCULAR | 0 refills | Status: AC

## 2019-08-30 NOTE — Assessment & Plan Note (Signed)
Encouraged continued diet and exercise. 

## 2019-08-30 NOTE — Assessment & Plan Note (Signed)
Patient has tested negative.  She will monitor for symptoms.

## 2019-08-30 NOTE — Progress Notes (Signed)
Virtual Visit via telephone Note  This visit type was conducted due to national recommendations for restrictions regarding the COVID-19 pandemic (e.g. social distancing).  This format is felt to be most appropriate for this patient at this time.  All issues noted in this document were discussed and addressed.  No physical exam was performed (except for noted visual exam findings with Video Visits).   I connected with Debbie Delacruz today at  9:30 AM EDT by telephone and verified that I am speaking with the correct person using two identifiers. Location patient: work Location provider: work Persons participating in the virtual visit: patient, provider  I discussed the limitations, risks, security and privacy concerns of performing an evaluation and management service by telephone and the availability of in person appointments. I also discussed with the patient that there may be a patient responsible charge related to this service. The patient expressed understanding and agreed to proceed.  Interactive audio and video telecommunications were attempted between this provider and patient, however failed, due to patient having technical difficulties OR patient did not have access to video capability.  We continued and completed visit with audio only.   Reason for visit: f/u.  HPI: Hypertension: Over the last week her blood pressure has ranged from 95-136/54-82.  Typically less than 130/80.  Taking lisinopril, metoprolol, amlodipine, and Imdur.  No chest pain, shortness of breath, or edema.  She is also on aspirin for CAD.  Obesity: Patient has started to walk some a couple of days a week.  She is lost couple of pounds.  She is eating healthier with lots of fruits and vegetables.  No soda.  No fried foods.  Covid exposure: Patient was exposed to Covid through her boss at work.  She had a Covid test about a week ago that was negative.  She is fully vaccinated.   ROS: See pertinent positives and  negatives per HPI.  Past Medical History:  Diagnosis Date  . Anemia   . Anginal pain ([place])   . Anxiety   . Arthritis   . CAD (coronary artery disease)   . Carpal tunnel syndrome    bilateral  . Cataract    bil removed cataracts  . Coronary artery disease    2x stents, Dr. Clayborn Bigness  . Depression   . Dysrhythmia   . Family history of adverse reaction to anesthesia    "brother; S/P lipotripsy in [place]; transferred to [facility]; had to put him on life support for 2 days"  . GERD (gastroesophageal reflux disease)   . Headache    "weekly" (09/13/2014)  . History of blood transfusion 05/2014   "we haven't figured out why I needed it yet" (09/13/2014)  . History of hiatal hernia   . Hyperglycemia   . Hyperlipidemia   . Hypertension   . Myocardial infarct ([place])   . Myocardial infarct, old 1999   15 years ago  . Obesity   . PONV (postoperative nausea and vomiting)   . S/P CABG x 4 09/18/2014   LIMA to LAD, SVG to PDA-dRCA sequentially, SVG to OM, EVH via right thigh   . Sleep apnea   . Sleep apnea    "don't use a mask" (09/13/2014)  . Unstable angina [facility])     Past Surgical History:  Procedure Laterality Date  . ABDOMINAL HYSTERECTOMY    . APPENDECTOMY    . BACK SURGERY    . BLADDER SURGERY    . CARDIAC CATHETERIZATION  06/2014  .  CARDIAC CATHETERIZATION N/A 06/16/2016   Procedure: Left Heart Cath and Cors/Grafts Angiography;  Surgeon: Burnell Blanks, MD;  Location: [facility];  Service: Cardiovascular;  Laterality: N/A;  . CARPAL TUNNEL RELEASE Left   . CATARACT EXTRACTION W/ INTRAOCULAR LENS  IMPLANT, BILATERAL    . CHOLECYSTECTOMY    . COLONOSCOPY    . CORONARY ANGIOPLASTY WITH STENT PLACEMENT  1999   [facility] x2 stent  . CORONARY ARTERY BYPASS GRAFT N/A 09/18/2014   Procedure: CORONARY ARTERY BYPASS GRAFTING (CABG)times four using LIMA  to LAD:SVG to  PD and DIST RCA;SVG to OM;, EVH Right thigh;  Surgeon: [name], MD;  Location: [facility];   Service: Open Heart Surgery;  Laterality: N/A;  . ESOPHAGOGASTRODUODENOSCOPY N/A 09/17/2014   Procedure: ESOPHAGOGASTRODUODENOSCOPY (EGD);  Surgeon: Inda Castle, MD;  Location: [facility];  Service: Endoscopy;  Laterality: N/A;  . heart stents     1999  . INCONTINENCE SURGERY    . INGUINAL HERNIA REPAIR    . INGUINAL HERNIA REPAIR Right X 3   "last one was in the 1990's; still have hernia there now" (09/13/2014)  . LAPAROSCOPIC CHOLECYSTECTOMY    . LUMBAR DISC SURGERY  1990's?  . LUMBAR SPINE SURGERY    . removal of ovaries    . TEE WITHOUT CARDIOVERSION N/A 09/18/2014   Procedure: TRANSESOPHAGEAL ECHOCARDIOGRAM (TEE);  Surgeon: [name], MD;  Location: [facility];  Service: Open Heart Surgery;  Laterality: N/A;  . UPPER GASTROINTESTINAL ENDOSCOPY      Family History  Problem Relation Age of Onset  . Coronary artery disease Mother   . Osteoporosis Mother   . Heart disease Mother   . Stroke Mother   . Heart attack Mother   . Hypertension Mother   . Hypertension Father   . Coronary artery disease Father   . COPD Father   . Heart disease Father   . Heart attack Father   . Glaucoma Father   . Osteoporosis Father   . Emphysema Father   . Cancer Sister 79       colon  . Hypertension Sister   . Colon cancer Sister        dx in her 26's  . Breast cancer Sister   . Colon polyps Sister   . Stroke Brother   . Hypertension Brother   . Colon polyps Brother   . Stroke Maternal Grandfather   . Stroke Maternal Grandmother   . Osteoporosis Maternal Grandmother   . Hypertension Maternal Grandmother   . Hypertension Paternal Grandmother   . Hypertension Paternal Grandfather   . Breast cancer Maternal Aunt        2 aunts-50's  . Esophageal cancer Neg Hx   . Stomach cancer Neg Hx   . Rectal cancer Neg Hx   . Pancreatic cancer Neg Hx     SOCIAL HX: Non-smoker   Current Outpatient Medications:  .  acetaminophen (TYLENOL) 500 MG tablet, Take 500 mg by mouth every 6 (six)  hours as needed for headache., Disp: , Rfl:  .  amLODipine (NORVASC) 5 MG tablet, Take 1 tablet (5 mg total) by mouth daily., Disp: 90 tablet, Rfl: 3 .  aspirin EC 81 MG tablet, Take 1 tablet (81 mg total) by mouth daily., Disp: , Rfl:  .  hydrOXYzine (ATARAX/VISTARIL) 10 MG tablet, Take 1 tablet (10 mg total) by mouth 3 (three) times daily as needed for anxiety., Disp: 30 tablet, Rfl: 0 .  isosorbide mononitrate (  IMDUR) 30 MG 24 hr tablet, Take 1 tablet by mouth daily., Disp: , Rfl:  .  lisinopril (ZESTRIL) 10 MG tablet, Take 1 tablet (10 mg total) by mouth daily. Please call to schedule appt for future refills., Disp: 90 tablet, Rfl: 1 .  metoprolol succinate (TOPROL-XL) 50 MG 24 hr tablet, TAKE 1 TABLET BY MOUTH ONCE DAILY, Disp: 30 tablet, Rfl: 3 .  rosuvastatin (CRESTOR) 40 MG tablet, Take 1 tablet (40 mg total) by mouth daily., Disp: 90 tablet, Rfl: 1 .  tetanus & diphtheria toxoids, adult, (TENIVAC) 5-2 LFU injection, Inject 0.5 mLs into the muscle once for 1 dose., Disp: 0.5 mL, Rfl: 0  EXAM:  VITALS per patient if applicable:  GENERAL: alert, oriented, appears well and in no acute distress  HEENT: atraumatic, conjunttiva clear, no obvious abnormalities on inspection of external nose and ears  NECK: normal movements of the head and neck  LUNGS: on inspection no signs of respiratory distress, breathing rate appears normal, no obvious gross SOB, gasping or wheezing  CV: no obvious cyanosis  MS: moves all visible extremities without noticeable abnormality  PSYCH/NEURO: pleasant and cooperative, no obvious depression or anxiety, speech and thought processing grossly intact  ASSESSMENT AND PLAN:  Discussed the following assessment and plan:  CAD (coronary artery disease) Asymptomatic.  Continue current regimen.  Screening for breast cancer Mammogram ordered.  She will call to schedule.  Dyslipidemia Continue Crestor.  Check lipid panel in 4 weeks.  Obesity (BMI  30.0-34.9) Encouraged continued diet and exercise.  Exposure to COVID-19 virus Patient has tested negative.  She will monitor for symptoms.   Health maintenance: Patient will call to schedule her mammogram and bone density scan.  She is going to call her GI physician to schedule her colonoscopy.  Tetanus vaccine to be completed at the pharmacy.  She will get Shingrix through the pharmacy as well.  Orders Placed This Encounter  Procedures  . MM 3D SCREEN BREAST BILATERAL    Standing Status:   Future    Standing Expiration Date:   10/29/2020    Order Specific Question:   Reason for Exam (SYMPTOM  OR DIAGNOSIS REQUIRED)    Answer:   breast cancer screening    Order Specific Question:   Preferred imaging location?    Answer:   [facility]  . DG Bone Density    Standing Status:   Future    Standing Expiration Date:   10/29/2020    Order Specific Question:   Reason for Exam (SYMPTOM  OR DIAGNOSIS REQUIRED)    Answer:   postmenopausal estrogen deficiency    Order Specific Question:   Preferred imaging location?    Answer:   [facility]  . Lipid panel    Standing Status:   Future    Standing Expiration Date:   08/29/2020  . Hepatic function panel    Standing Status:   Future    Standing Expiration Date:   08/29/2020    Meds ordered this encounter  Medications  . tetanus & diphtheria toxoids, adult, (TENIVAC) 5-2 LFU injection    Sig: Inject 0.5 mLs into the muscle once for 1 dose.    Dispense:  0.5 mL    Refill:  0     I discussed the assessment and treatment plan with the patient. The patient was provided an opportunity to ask questions and all were answered. The patient agreed with the plan and demonstrated an understanding of the instructions.   The patient was  advised to call back or seek an in-person evaluation if the symptoms worsen or if the condition fails to improve as anticipated.  I provided 11 minutes of non-face-to-face time during this encounter.   Tommi Rumps, MD

## 2019-08-30 NOTE — Assessment & Plan Note (Signed)
Mammogram ordered She will call to schedule 

## 2019-08-30 NOTE — Assessment & Plan Note (Signed)
Asymptomatic.  Continue current regimen. 

## 2019-08-30 NOTE — Telephone Encounter (Signed)
Reviewed

## 2019-08-30 NOTE — Assessment & Plan Note (Signed)
Continue Crestor.  Check lipid panel in 4 weeks.

## 2019-08-31 ENCOUNTER — Telehealth: Payer: Self-pay

## 2019-08-31 NOTE — Telephone Encounter (Signed)
Today A Fax was received giving notice that Hydroxyzine was approved from 06/01/2019-08/29/2020 for the patient.  Fax sent to scan.  Nina,cma

## 2019-09-04 ENCOUNTER — Ambulatory Visit: Payer: Medicare Other

## 2019-09-04 ENCOUNTER — Telehealth: Payer: Self-pay

## 2019-09-04 NOTE — Telephone Encounter (Signed)
Failed to reach patient for scheduled awv. No answer. Left a message to call the office back within allotted timeframe of appointment or reschedule as appropriate.

## 2019-09-05 ENCOUNTER — Other Ambulatory Visit: Payer: Medicare Other

## 2019-09-15 ENCOUNTER — Other Ambulatory Visit: Payer: Medicare Other

## 2019-09-15 ENCOUNTER — Other Ambulatory Visit: Payer: Self-pay

## 2019-09-19 ENCOUNTER — Encounter: Payer: Self-pay | Admitting: Family Medicine

## 2019-09-19 ENCOUNTER — Other Ambulatory Visit: Payer: Self-pay

## 2019-09-19 ENCOUNTER — Other Ambulatory Visit (INDEPENDENT_AMBULATORY_CARE_PROVIDER_SITE_OTHER): Payer: Medicare Other

## 2019-09-19 DIAGNOSIS — I10 Essential (primary) hypertension: Secondary | ICD-10-CM

## 2019-09-19 DIAGNOSIS — E785 Hyperlipidemia, unspecified: Secondary | ICD-10-CM | POA: Diagnosis not present

## 2019-09-19 LAB — HEPATIC FUNCTION PANEL
ALT: 13 U/L (ref 0–35)
AST: 16 U/L (ref 0–37)
Albumin: 4.3 g/dL (ref 3.5–5.2)
Alkaline Phosphatase: 50 U/L (ref 39–117)
Bilirubin, Direct: 0.1 mg/dL (ref 0.0–0.3)
Total Bilirubin: 0.7 mg/dL (ref 0.2–1.2)
Total Protein: 7.3 g/dL (ref 6.0–8.3)

## 2019-09-19 LAB — LIPID PANEL
Cholesterol: 168 mg/dL (ref 0–200)
HDL: 40.6 mg/dL (ref >39.00)
LDL Cholesterol: 104 mg/dL — ABNORMAL HIGH (ref 0–99)
NonHDL: 127.1
Total CHOL/HDL Ratio: 4
Triglycerides: 118 mg/dL (ref 0.0–149.0)
VLDL: 23.6 mg/dL (ref 0.0–40.0)

## 2019-09-19 LAB — BASIC METABOLIC PANEL
BUN: 17 mg/dL (ref 6–23)
CO2: 27 mEq/L (ref 19–32)
Calcium: 9.3 mg/dL (ref 8.4–10.5)
Chloride: 105 mEq/L (ref 96–112)
Creatinine, Ser: 0.96 mg/dL (ref 0.40–1.20)
GFR: 57.16 mL/min — ABNORMAL LOW (ref >60.00)
Glucose, Bld: 125 mg/dL — ABNORMAL HIGH (ref 70–99)
Potassium: 4.3 mEq/L (ref 3.5–5.1)
Sodium: 139 mEq/L (ref 135–145)

## 2019-09-20 ENCOUNTER — Other Ambulatory Visit: Payer: Medicare Other

## 2019-10-10 ENCOUNTER — Encounter: Payer: Self-pay | Admitting: Family Medicine

## 2019-10-10 ENCOUNTER — Encounter: Payer: Self-pay | Admitting: Gastroenterology

## 2019-10-10 MED ORDER — NITROGLYCERIN 0.4 MG/SPRAY TL SOLN
1.0000 | 1 refills | Status: DC | PRN
Start: 2019-10-10 — End: 2019-10-11

## 2019-10-10 MED ORDER — ISOSORBIDE MONONITRATE ER 30 MG PO TB24: 30.0000 mg | ORAL_TABLET | Freq: Every day | ORAL | 1 refills | Status: DC

## 2019-10-11 ENCOUNTER — Telehealth: Payer: Self-pay

## 2019-10-11 MED ORDER — NITROGLYCERIN 0.4 MG SL SUBL
0.4000 mg | SUBLINGUAL_TABLET | SUBLINGUAL | 3 refills | Status: DC | PRN
Start: 2019-10-11 — End: 2020-12-06

## 2019-10-11 NOTE — Telephone Encounter (Signed)
Nitro tablets sent to pharmacy. Please inform the patient.

## 2019-10-12 NOTE — Telephone Encounter (Signed)
I called the patient and informed her that the nitroglycerin spray was not covered by her insurance and I did a PA and it was denies.  I informed her that the provider sent over nitroglycerin tablets instead and she was ok with that.  Jamill Wetmore,cma

## 2019-10-17 ENCOUNTER — Ambulatory Visit
Admission: RE | Admit: 2019-10-17 | Discharge: 2019-10-17 | Disposition: A | Discharge status: Home / Self Care | Payer: Medicare Other | Source: Ambulatory Visit | Attending: Family Medicine | Admitting: Family Medicine

## 2019-10-17 DIAGNOSIS — Z780 Asymptomatic menopausal state: Secondary | ICD-10-CM

## 2019-10-17 DIAGNOSIS — Z1231 Encounter for screening mammogram for malignant neoplasm of breast: Secondary | ICD-10-CM | POA: Insufficient documentation

## 2019-10-31 ENCOUNTER — Other Ambulatory Visit: Payer: Self-pay | Admitting: Family Medicine

## 2019-11-12 NOTE — Progress Notes (Deleted)
[place]  [phone] [phone]  ID: Debbie Delacruz OB: [birth year]  MR#: [id number]  WNU#:[id number]  Patient Care Team: [name], MD as PCP - General (Family Medicine) [name], MD (Internal Medicine) [name], MD as Consulting Physician (Cardiothoracic Surgery) [name], MD as Consulting Physician (Cardiology)   CHIEF COMPLAINT: Iron deficiency anemia.  INTERVAL HISTORY: Patient returns to clinic today for repeat laboratory work, further evaluation, and consideration of additional IV Feraheme.  She currently feels well and is asymptomatic.  She does not complain of any weakness or fatigue. She has no neurologic complaints. She denies any recent fevers or illnesses. She has a good appetite and denies weight loss.  She denies any chest pain, shortness of breath, cough, or hemoptysis.  She denies any nausea, vomiting, constipation, or diarrhea. She denies any melena or hematochezia. She has no urinary complaints.  Patient offers no specific complaints today.  REVIEW OF SYSTEMS:   Review of Systems  Constitutional: Negative.  Negative for fever, malaise/fatigue and weight loss.  Respiratory: Negative.  Negative for cough and shortness of breath.   Cardiovascular: Negative.  Negative for chest pain and leg swelling.  Gastrointestinal: Negative.  Negative for abdominal pain, blood in stool and melena.  Genitourinary: Negative.  Negative for hematuria.  Musculoskeletal: Negative.  Negative for joint pain.  Skin: Negative.  Negative for rash.  Neurological: Negative.  Negative for dizziness, sensory change, focal weakness, weakness and headaches.  Psychiatric/Behavioral: Negative.  The patient is not nervous/anxious and does not have insomnia.     As per HPI. Otherwise, a complete review of systems is negative.  PAST MEDICAL HISTORY: Past Medical History:  Diagnosis Date  . Anemia   . Anginal pain  ([place])   . Anxiety   . Arthritis   . CAD (coronary artery disease)   . Carpal tunnel syndrome    bilateral  . Cataract    bil removed cataracts  . Coronary artery disease    2x stents, Dr. Clayborn Bigness  . Depression   . Dysrhythmia   . Family history of adverse reaction to anesthesia    "brother; S/P lipotripsy in [place]; transferred to [facility]; had to put him on life support for 2 days"  . GERD (gastroesophageal reflux disease)   . Headache    "weekly" (09/13/2014)  . History of blood transfusion 05/2014   "we haven't figured out why I needed it yet" (09/13/2014)  . History of hiatal hernia   . Hyperglycemia   . Hyperlipidemia   . Hypertension   . Myocardial infarct ([place])   . Myocardial infarct, old 1999   15 years ago  . Obesity   . PONV (postoperative nausea and vomiting)   . S/P CABG x 4 09/18/2014   LIMA to LAD, SVG to PDA-dRCA sequentially, SVG to OM, EVH via right thigh   . Sleep apnea   . Sleep apnea    "don't use a mask" (09/13/2014)  . Unstable angina ([place])     PAST SURGICAL HISTORY: Past Surgical History:  Procedure Laterality Date  . ABDOMINAL HYSTERECTOMY    . APPENDECTOMY    . BACK SURGERY    . BLADDER SURGERY    . CARDIAC CATHETERIZATION  06/2014  . CARDIAC CATHETERIZATION N/A 06/16/2016   Procedure: Left Heart Cath and Cors/Grafts Angiography;  Surgeon: Burnell Blanks, MD;  Location: [place] [facility];  Service: Cardiovascular;  Laterality: N/A;  . CARPAL TUNNEL RELEASE Left   .  CATARACT EXTRACTION W/ INTRAOCULAR LENS  IMPLANT, BILATERAL    . CHOLECYSTECTOMY    . COLONOSCOPY    . CORONARY ANGIOPLASTY WITH STENT PLACEMENT  1999   [facility] x2 stent  . CORONARY ARTERY BYPASS GRAFT N/A 09/18/2014   Procedure: CORONARY ARTERY BYPASS GRAFTING (CABG)times four using LIMA  to LAD:SVG to  PD and DIST RCA;SVG to OM;, EVH Right thigh;  Surgeon: [name], MD;  Location: [place];  Service: Open Heart Surgery;  Laterality: N/A;  .  ESOPHAGOGASTRODUODENOSCOPY N/A 09/17/2014   Procedure: ESOPHAGOGASTRODUODENOSCOPY (EGD);  Surgeon: Inda Castle, MD;  Location: [facility];  Service: Endoscopy;  Laterality: N/A;  . heart stents     1999  . INCONTINENCE SURGERY    . INGUINAL HERNIA REPAIR    . INGUINAL HERNIA REPAIR Right X 3   "last one was in the 1990's; still have hernia there now" (09/13/2014)  . LAPAROSCOPIC CHOLECYSTECTOMY    . LUMBAR DISC SURGERY  1990's?  . LUMBAR SPINE SURGERY    . removal of ovaries    . TEE WITHOUT CARDIOVERSION N/A 09/18/2014   Procedure: TRANSESOPHAGEAL ECHOCARDIOGRAM (TEE);  Surgeon: [name], MD;  Location: [facility];  Service: Open Heart Surgery;  Laterality: N/A;  . UPPER GASTROINTESTINAL ENDOSCOPY      FAMILY HISTORY: Family History  Problem Relation Age of Onset  . Coronary artery disease Mother   . Osteoporosis Mother   . Heart disease Mother   . Stroke Mother   . Heart attack Mother   . Hypertension Mother   . Hypertension Father   . Coronary artery disease Father   . COPD Father   . Heart disease Father   . Heart attack Father   . Glaucoma Father   . Osteoporosis Father   . Emphysema Father   . Cancer Sister 76       colon  . Hypertension Sister   . Colon cancer Sister        dx in her 44's  . Breast cancer Sister   . Colon polyps Sister   . Stroke Brother   . Hypertension Brother   . Colon polyps Brother   . Stroke Maternal Grandfather   . Stroke Maternal Grandmother   . Osteoporosis Maternal Grandmother   . Hypertension Maternal Grandmother   . Hypertension Paternal Grandmother   . Hypertension Paternal Grandfather   . Breast cancer Maternal Aunt        2 aunts-50's  . Esophageal cancer Neg Hx   . Stomach cancer Neg Hx   . Rectal cancer Neg Hx   . Pancreatic cancer Neg Hx     ADVANCED DIRECTIVES (Y/N):  N  HEALTH MAINTENANCE: Social History   Tobacco Use  . Smoking status: Never Smoker  . Smokeless tobacco: Never Used  Vaping Use  .  Vaping Use: Never used  Substance Use Topics  . Alcohol use: Not Currently    Alcohol/week: 0.0 standard drinks  . Drug use: No     Colonoscopy:  PAP:  Bone density:  Lipid panel:  Allergies  Allergen Reactions  . Ferrous Sulfate [Ferrous Fumarate] Other (See Comments)    STOMACH ISSUES  . Lipitor [Atorvastatin] Other (See Comments)    myalgia    Current Outpatient Medications  Medication Sig Dispense Refill  . acetaminophen (TYLENOL) 500 MG tablet Take 500 mg by mouth every 6 (six) hours as needed for headache.    Marland Kitchen amLODipine (NORVASC) 5 MG tablet Take 1 tablet (5  mg total) by mouth daily. 90 tablet 3  . aspirin EC 81 MG tablet Take 1 tablet (81 mg total) by mouth daily.    . hydrOXYzine (ATARAX/VISTARIL) 10 MG tablet Take 1 tablet (10 mg total) by mouth 3 (three) times daily as needed for anxiety. 30 tablet 0  . isosorbide mononitrate (IMDUR) 30 MG 24 hr tablet Take 1 tablet (30 mg total) by mouth daily. 90 tablet 1  . lisinopril (ZESTRIL) 10 MG tablet Take 1 tablet (10 mg total) by mouth daily. Please call to schedule appt for future refills. 90 tablet 1  . metoprolol succinate (TOPROL-XL) 50 MG 24 hr tablet TAKE 1 TABLET BY MOUTH ONCE DAILY 30 tablet 3  . nitroGLYCERIN (NITROSTAT) 0.4 MG SL tablet Place 1 tablet (0.4 mg total) under the tongue every 5 (five) minutes as needed for chest pain. Take for up to 3 doses and then call 911 if still having chest pain. 50 tablet 3  . rosuvastatin (CRESTOR) 40 MG tablet Take 1 tablet (40 mg total) by mouth daily. 90 tablet 1   No current facility-administered medications for this visit.    OBJECTIVE: There were no vitals filed for this visit.   There is no height or weight on file to calculate BMI.    ECOG FS:0 - Asymptomatic  General: Well-developed, well-nourished, no acute distress. Eyes: Pink conjunctiva, anicteric sclera. HEENT: Normocephalic, moist mucous membranes. Lungs: No audible wheezing or coughing. Heart: Regular rate  and rhythm. Abdomen: Soft, nontender, no obvious distention. Musculoskeletal: No edema, cyanosis, or clubbing. Neuro: Alert, answering all questions appropriately. Cranial nerves grossly intact. Skin: No rashes or petechiae noted. Psych: Normal affect.  LAB RESULTS:  Lab Results  Component Value Date   NA 139 09/19/2019   K 4.3 09/19/2019   CL 105 09/19/2019   CO2 27 09/19/2019   GLUCOSE 125 (H) 09/19/2019   BUN 17 09/19/2019   CREATININE 0.96 09/19/2019   CALCIUM 9.3 09/19/2019   PROT 7.3 09/19/2019   ALBUMIN 4.3 09/19/2019   AST 16 09/19/2019   ALT 13 09/19/2019   ALKPHOS 50 09/19/2019   BILITOT 0.7 09/19/2019   GFRNONAA >60 08/16/2018   GFRAA >60 08/16/2018    Lab Results  Component Value Date   WBC 7.1 07/18/2019   NEUTROABS 4.1 07/18/2019   HGB 15.1 (H) 07/18/2019   HCT 46.3 (H) 07/18/2019   MCV 94.5 07/18/2019   PLT 229 07/18/2019   Lab Results  Component Value Date   IRON 65 07/18/2019   TIBC 414 07/18/2019   IRONPCTSAT 16 07/18/2019   Lab Results  Component Value Date   FERRITIN 24 07/18/2019     STUDIES: DG Bone Density  Result Date: 10/17/2019 EXAM: DUAL X-RAY ABSORPTIOMETRY (DXA) FOR BONE MINERAL DENSITY IMPRESSION: Dear Dr. Caryl Bis, Your patient GEORGANA ROMAIN completed a FRAX assessment on 10/17/2019 using the [place] (analysis version: 14.10) manufactured by [employer]. The following summarizes the results of our evaluation. PATIENT BIOGRAPHICAL: Name: Rosealee, Recinos Patient ID: [id number] Birth Date: 1945-10-25 Height:    62.0 in. Gender:     Female    [age]       Weight:    178.1 lbs. Ethnicity:  White                            Exam Date: 10/17/2019 FRAX* RESULTS:  (version: 3.5) 10-year Probability of Fracture1  Major Osteoporotic Fracture2 Hip Fracture 10.2% 1.7% Population: [place] (Caucasian) Risk Factors: None Based on Femur (Left) Neck BMD 1 -The 10-year probability of fracture may be lower than reported if the  patient has received treatment. 2 -Major Osteoporotic Fracture: Clinical Spine, Forearm, Hip or Shoulder *FRAX is a Materials engineer of the [employer] for Metabolic Bone Disease, a [place] (WHO) [facility]. ASSESSMENT: The probability of a major osteoporotic fracture is 10.2 within the next ten years. The probability of a hip fracture is 1.7 within the next ten years. . Your patient Chatara Lucente completed a BMD test on 10/17/2019 using the [place] (software version: 14.10) manufactured by [employer]. The following summarizes the results of our evaluation. Technologist: [facility] PATIENT BIOGRAPHICAL: Name: Anvitha, Hutmacher Patient ID: [id number] Birth Date: 10/30/1945 Height: 62.0 in. Gender: Female Exam Date: 10/17/2019 Weight: 178.1 lbs. Indications: Advanced Age, Caucasian, Hysterectomy, Oophorectomy Bilateral, Postmenopausal Fractures: Treatments: DENSITOMETRY RESULTS: Site         Region     Measured Date Measured Age WHO Classification Young Adult T-score BMD         %Change vs. Previous Significant Change (*) AP Spine L1-L4 10/17/2019 71.9 Normal -0.1 1.181 g/cm2 DualFemur Neck Left 10/17/2019 71.9 Osteopenia -1.6 0.816 g/cm2 Left Forearm Radius 33% 10/17/2019 71.9 Osteopenia -1.1 0.777 g/cm2 ASSESSMENT: The BMD measured at Femur Neck Left is 0.816 g/cm2 with a T-score of -1.6. This patient is considered osteopenic according to [facility]) criteria. The scan quality is good. World Pharmacologist [facility]) criteria for post-menopausal, Caucasian Women: Normal:                   T-score at or above -1 SD Osteopenia/low bone mass: T-score between -1 and -2.5 SD Osteoporosis:             T-score at or below -2.5 SD RECOMMENDATIONS: 1. All patients should optimize calcium and vitamin D intake. 2. Consider FDA-approved medical therapies in postmenopausal women and men aged 26 years and older, based on the  following: a. A hip or vertebral(clinical or morphometric) fracture b. T-score < -2.5 at the femoral neck or spine after appropriate evaluation to exclude secondary causes c. Low bone mass (T-score between -1.0 and -2.5 at the femoral neck or spine) and a 10-year probability of a hip fracture > 3% or a 10-year probability of a major osteoporosis-related fracture > 20% based on the [place]-adapted WHO algorithm 3. Clinician judgment and/or patient preferences may indicate treatment for people with 10-year fracture probabilities above or below these levels FOLLOW-UP: People with diagnosed cases of osteoporosis or at high risk for fracture should have regular bone mineral density tests. For patients eligible for Medicare, routine testing is allowed once every 2 years. The testing frequency can be increased to one year for patients who have rapidly progressing disease, those who are receiving or discontinuing medical therapy to restore bone mass, or have additional risk factors. I have reviewed this report, and agree with the above findings. [facility] Electronically Signed   By: Lowella Grip III M.D.   On: 10/17/2019 09:08   MM 3D SCREEN BREAST BILATERAL  Result Date: 10/17/2019 CLINICAL DATA:  Screening. EXAM: DIGITAL SCREENING BILATERAL MAMMOGRAM WITH TOMO AND CAD COMPARISON:  Previous exam(s). ACR Breast Density Category a: The breast tissue is almost entirely fatty. FINDINGS: There are no findings suspicious for malignancy. Images were processed with CAD. IMPRESSION: No mammographic evidence  of malignancy. A result letter of this screening mammogram will be mailed directly to the patient. RECOMMENDATION: Screening mammogram in one year. (Code:SM-B-01Y) BI-RADS CATEGORY  1: Negative. Electronically Signed   By: Kristopher Oppenheim M.D.   On: 10/17/2019 16:22    ASSESSMENT: Iron deficiency anemia.  PLAN:    1. Iron deficiency anemia: Patient's hemoglobin and iron stores are within normal limits  today. Previously, she reported she cannot tolerate oral iron supplementation. The remainder of her laboratory work is either negative or within normal limits.  Patient had a normal endoscopy in May 2017 and a normal colonoscopy in August 2014.  Patient will likely require repeat colonoscopy in the near future.  She does not require additional IV Feraheme today.  Return to clinic in 4 months with repeat laboratory work, further evaluation, and consideration of additional treatment. 2.  Hypertension: Patient's blood pressure is significantly elevated today.  Continue monitoring and treatment per primary care.   Patient expressed understanding and was in agreement with this plan. She also understands that She can call clinic at any time with any questions, concerns, or complaints.    Lloyd Huger, MD   11/12/2019 9:11 AM

## 2019-11-13 ENCOUNTER — Telehealth: Payer: Self-pay | Admitting: Oncology

## 2019-11-13 NOTE — Telephone Encounter (Signed)
Patient phoned on this date and stated that she needed to reschedule her appt as she needed to work. Appts rescheduled to 11-24-19.

## 2019-11-14 ENCOUNTER — Telehealth: Payer: Self-pay | Admitting: Family Medicine

## 2019-11-14 NOTE — Telephone Encounter (Signed)
Copied from [place] [id number]. Topic: Medicare AWV >> Nov 14, 2019 12:11 PM Cher Nakai R wrote: Reason for CRM: Left message for patient to call back and schedule Medicare Annual Wellness Visit (AWV) either virtually or in office.  No hx of AWV; please schedule at anytime with [facility] Advisor.

## 2019-11-16 ENCOUNTER — Inpatient Hospital Stay: Payer: Medicare Other | Admitting: Oncology

## 2019-11-16 ENCOUNTER — Inpatient Hospital Stay: Payer: Medicare Other

## 2019-11-18 NOTE — Progress Notes (Signed)
[place]  Telephone:(336) [phone] Fax:(336) [phone]  ID: Debbie Delacruz OB: Jul 05, 1945  MR#: [id number]  CBJ#:[id number]  Patient Care Team: [name], MD as PCP - General (Family Medicine) [name], MD (Internal Medicine) [name], MD as Consulting Physician (Cardiothoracic Surgery) [name], MD as Consulting Physician (Cardiology) Lloyd Huger, MD as Consulting Physician (Oncology)   CHIEF COMPLAINT: Iron deficiency anemia.  INTERVAL HISTORY: Patient returns to clinic today for repeat laboratory work, further evaluation, and consideration of IV Feraheme.  She continues to feel well and remains asymptomatic.  She does not complain of any weakness or fatigue.  She has no neurologic complaints. She denies any recent fevers or illnesses. She has a good appetite and denies weight loss.  She denies any chest pain, shortness of breath, cough, or hemoptysis.  She denies any nausea, vomiting, constipation, or diarrhea. She denies any melena or hematochezia. She has no urinary complaints.  Patient feels at her baseline and offers no specific complaints today.  REVIEW OF SYSTEMS:   Review of Systems  Constitutional: Negative.  Negative for fever, malaise/fatigue and weight loss.  Respiratory: Negative.  Negative for cough and shortness of breath.   Cardiovascular: Negative.  Negative for chest pain and leg swelling.  Gastrointestinal: Negative.  Negative for abdominal pain, blood in stool and melena.  Genitourinary: Negative.  Negative for hematuria.  Musculoskeletal: Negative.  Negative for joint pain.  Skin: Negative.  Negative for rash.  Neurological: Negative.  Negative for dizziness, sensory change, focal weakness, weakness and headaches.  Psychiatric/Behavioral: Negative.  The patient is not nervous/anxious and does not have insomnia.     As per HPI. Otherwise, a complete review of systems is negative.  PAST MEDICAL  HISTORY: Past Medical History:  Diagnosis Date  . Anemia   . Anginal pain ([place])   . Anxiety   . Arthritis   . CAD (coronary artery disease)   . Carpal tunnel syndrome    bilateral  . Cataract    bil removed cataracts  . Coronary artery disease    2x stents, Dr. Clayborn Bigness  . Depression   . Dysrhythmia   . Family history of adverse reaction to anesthesia    "brother; S/P lipotripsy in [place]; transferred to [facility]; had to put him on life support for 2 days"  . GERD (gastroesophageal reflux disease)   . Headache    "weekly" (09/13/2014)  . History of blood transfusion 05/2014   "we haven't figured out why I needed it yet" (09/13/2014)  . History of hiatal hernia   . Hyperglycemia   . Hyperlipidemia   . Hypertension   . Myocardial infarct ([place])   . Myocardial infarct, old 1999   15 years ago  . Obesity   . PONV (postoperative nausea and vomiting)   . S/P CABG x 4 09/18/2014   LIMA to LAD, SVG to PDA-dRCA sequentially, SVG to OM, EVH via right thigh   . Sleep apnea   . Sleep apnea    "don't use a mask" (09/13/2014)  . Unstable angina ([place])     PAST SURGICAL HISTORY: Past Surgical History:  Procedure Laterality Date  . ABDOMINAL HYSTERECTOMY    . APPENDECTOMY    . BACK SURGERY    . BLADDER SURGERY    . CARDIAC CATHETERIZATION  06/2014  . CARDIAC CATHETERIZATION N/A 06/16/2016   Procedure: Left Heart Cath and Cors/Grafts Angiography;  Surgeon: Burnell Blanks, MD;  Location: [place]  CV LAB;  Service: Cardiovascular;  Laterality: N/A;  . CARPAL TUNNEL RELEASE Left   . CATARACT EXTRACTION W/ INTRAOCULAR LENS  IMPLANT, BILATERAL    . CHOLECYSTECTOMY    . COLONOSCOPY    . CORONARY ANGIOPLASTY WITH STENT PLACEMENT  1999   [facility] x2 stent  . CORONARY ARTERY BYPASS GRAFT N/A 09/18/2014   Procedure: CORONARY ARTERY BYPASS GRAFTING (CABG)times four using LIMA  to LAD:SVG to  PD and DIST RCA;SVG to OM;, EVH Right thigh;  Surgeon: [name], MD;  Location: [place];  Service: Open Heart Surgery;  Laterality: N/A;  . ESOPHAGOGASTRODUODENOSCOPY N/A 09/17/2014   Procedure: ESOPHAGOGASTRODUODENOSCOPY (EGD);  Surgeon: Inda Castle, MD;  Location: [place];  Service: Endoscopy;  Laterality: N/A;  . heart stents     1999  . INCONTINENCE SURGERY    . INGUINAL HERNIA REPAIR    . INGUINAL HERNIA REPAIR Right X 3   "last one was in the 1990's; still have hernia there now" (09/13/2014)  . LAPAROSCOPIC CHOLECYSTECTOMY    . LUMBAR DISC SURGERY  1990's?  . LUMBAR SPINE SURGERY    . removal of ovaries    . TEE WITHOUT CARDIOVERSION N/A 09/18/2014   Procedure: TRANSESOPHAGEAL ECHOCARDIOGRAM (TEE);  Surgeon: [name], MD;  Location: [place];  Service: Open Heart Surgery;  Laterality: N/A;  . UPPER GASTROINTESTINAL ENDOSCOPY      FAMILY HISTORY: Family History  Problem Relation Age of Onset  . Coronary artery disease Mother   . Osteoporosis Mother   . Heart disease Mother   . Stroke Mother   . Heart attack Mother   . Hypertension Mother   . Hypertension Father   . Coronary artery disease Father   . COPD Father   . Heart disease Father   . Heart attack Father   . Glaucoma Father   . Osteoporosis Father   . Emphysema Father   . Cancer Sister 48       colon  . Hypertension Sister   . Colon cancer Sister        dx in her 63's  . Breast cancer Sister   . Colon polyps Sister   . Stroke Brother   . Hypertension Brother   . Colon polyps Brother   . Stroke Maternal Grandfather   . Stroke Maternal Grandmother   . Osteoporosis Maternal Grandmother   . Hypertension Maternal Grandmother   . Hypertension Paternal Grandmother   . Hypertension Paternal Grandfather   . Breast cancer Maternal Aunt        2 aunts-50's  . Esophageal cancer Neg Hx   . Stomach cancer Neg Hx   . Rectal cancer Neg Hx   . Pancreatic cancer Neg Hx     ADVANCED DIRECTIVES (Y/N):  N  HEALTH MAINTENANCE: Social History   Tobacco Use  . Smoking status: Never Smoker    . Smokeless tobacco: Never Used  Vaping Use  . Vaping Use: Never used  Substance Use Topics  . Alcohol use: Not Currently    Alcohol/week: 0.0 standard drinks  . Drug use: No     Colonoscopy:  PAP:  Bone density:  Lipid panel:  Allergies  Allergen Reactions  . Ferrous Sulfate [Ferrous Fumarate] Other (See Comments)    STOMACH ISSUES  . Lipitor [Atorvastatin] Other (See Comments)    myalgia    Current Outpatient Medications  Medication Sig Dispense Refill  . acetaminophen (TYLENOL) 500 MG tablet Take 500 mg by mouth every 6 (six)  hours as needed for headache.    Marland Kitchen amLODipine (NORVASC) 5 MG tablet Take 1 tablet (5 mg total) by mouth daily. 90 tablet 3  . aspirin EC 81 MG tablet Take 1 tablet (81 mg total) by mouth daily.    . hydrOXYzine (ATARAX/VISTARIL) 10 MG tablet Take 1 tablet (10 mg total) by mouth 3 (three) times daily as needed for anxiety. 30 tablet 0  . isosorbide mononitrate (IMDUR) 30 MG 24 hr tablet Take 1 tablet (30 mg total) by mouth daily. 90 tablet 1  . lisinopril (ZESTRIL) 10 MG tablet Take 1 tablet (10 mg total) by mouth daily. Please call to schedule appt for future refills. 90 tablet 1  . metoprolol succinate (TOPROL-XL) 50 MG 24 hr tablet TAKE 1 TABLET BY MOUTH ONCE DAILY 30 tablet 3  . nitroGLYCERIN (NITROSTAT) 0.4 MG SL tablet Place 1 tablet (0.4 mg total) under the tongue every 5 (five) minutes as needed for chest pain. Take for up to 3 doses and then call 911 if still having chest pain. 50 tablet 3  . rosuvastatin (CRESTOR) 40 MG tablet Take 1 tablet (40 mg total) by mouth daily. 90 tablet 1   No current facility-administered medications for this visit.    OBJECTIVE: Vitals:   11/24/19 1307  BP: (!) 168/89  Pulse: 70  Temp: 98.6 F (37 C)  SpO2: 99%     Body mass index is 32.28 kg/m.    ECOG FS:0 - Asymptomatic  General: Well-developed, well-nourished, no acute distress. Eyes: Pink conjunctiva, anicteric sclera. HEENT: Normocephalic, moist  mucous membranes. Lungs: No audible wheezing or coughing. Heart: Regular rate and rhythm. Abdomen: Soft, nontender, no obvious distention. Musculoskeletal: No edema, cyanosis, or clubbing. Neuro: Alert, answering all questions appropriately. Cranial nerves grossly intact. Skin: No rashes or petechiae noted. Psych: Normal affect.  LAB RESULTS:  Lab Results  Component Value Date   NA 139 09/19/2019   K 4.3 09/19/2019   CL 105 09/19/2019   CO2 27 09/19/2019   GLUCOSE 125 (H) 09/19/2019   BUN 17 09/19/2019   CREATININE 0.96 09/19/2019   CALCIUM 9.3 09/19/2019   PROT 7.3 09/19/2019   ALBUMIN 4.3 09/19/2019   AST 16 09/19/2019   ALT 13 09/19/2019   ALKPHOS 50 09/19/2019   BILITOT 0.7 09/19/2019   GFRNONAA >60 08/16/2018   GFRAA >60 08/16/2018    Lab Results  Component Value Date   WBC 6.0 11/24/2019   NEUTROABS 3.1 11/24/2019   HGB 14.8 11/24/2019   HCT 44.2 11/24/2019   MCV 92.5 11/24/2019   PLT 202 11/24/2019   Lab Results  Component Value Date   IRON 86 11/24/2019   TIBC 391 11/24/2019   IRONPCTSAT 22 11/24/2019   Lab Results  Component Value Date   FERRITIN 28 11/24/2019     STUDIES: No results found.  ASSESSMENT: Iron deficiency anemia.  PLAN:    1. Iron deficiency anemia: Patient's hemoglobin and iron stores continue to be within normal limits. Previously, she reported she cannot tolerate oral iron supplementation. The remainder of her laboratory work is either negative or within normal limits.  Patient had a normal endoscopy in May 2017 and a normal colonoscopy in August 2014.  Patient will likely require repeat colonoscopy in the near future.  She does not require additional IV Feraheme today.  Patient last received IV Feraheme on May 08, 2019.  If she does not require treatment at her next visit, can consider discharging patient from clinic. 2.  Hypertension:  Chronic and unchanged.  Patient's blood pressure remains significantly elevated.  Continue  evaluation and treatment per primary care.   Patient expressed understanding and was in agreement with this plan. She also understands that She can call clinic at any time with any questions, concerns, or complaints.    Lloyd Huger, MD   11/25/2019 7:57 AM

## 2019-11-23 ENCOUNTER — Other Ambulatory Visit: Payer: Self-pay | Admitting: Emergency Medicine

## 2019-11-23 DIAGNOSIS — D509 Iron deficiency anemia, unspecified: Secondary | ICD-10-CM

## 2019-11-24 ENCOUNTER — Inpatient Hospital Stay: Payer: Medicare Other | Attending: Oncology

## 2019-11-24 ENCOUNTER — Inpatient Hospital Stay (HOSPITAL_BASED_OUTPATIENT_CLINIC_OR_DEPARTMENT_OTHER): Payer: Medicare Other | Admitting: Oncology

## 2019-11-24 ENCOUNTER — Other Ambulatory Visit: Payer: Self-pay

## 2019-11-24 ENCOUNTER — Encounter: Payer: Self-pay | Admitting: Oncology

## 2019-11-24 ENCOUNTER — Inpatient Hospital Stay: Payer: Medicare Other

## 2019-11-24 VITALS — BP 168/89 | HR 70 | Temp 98.6°F | Wt 176.5 lb

## 2019-11-24 DIAGNOSIS — Z803 Family history of malignant neoplasm of breast: Secondary | ICD-10-CM | POA: Insufficient documentation

## 2019-11-24 DIAGNOSIS — D509 Iron deficiency anemia, unspecified: Secondary | ICD-10-CM

## 2019-11-24 DIAGNOSIS — Z79899 Other long term (current) drug therapy: Secondary | ICD-10-CM | POA: Insufficient documentation

## 2019-11-24 DIAGNOSIS — I2510 Atherosclerotic heart disease of native coronary artery without angina pectoris: Secondary | ICD-10-CM | POA: Insufficient documentation

## 2019-11-24 DIAGNOSIS — G4730 Sleep apnea, unspecified: Secondary | ICD-10-CM | POA: Diagnosis not present

## 2019-11-24 DIAGNOSIS — E669 Obesity, unspecified: Secondary | ICD-10-CM | POA: Diagnosis not present

## 2019-11-24 DIAGNOSIS — I209 Angina pectoris, unspecified: Secondary | ICD-10-CM | POA: Insufficient documentation

## 2019-11-24 DIAGNOSIS — M129 Arthropathy, unspecified: Secondary | ICD-10-CM | POA: Diagnosis not present

## 2019-11-24 DIAGNOSIS — Z800 Family history of malignant neoplasm of digestive organs: Secondary | ICD-10-CM | POA: Diagnosis not present

## 2019-11-24 DIAGNOSIS — Z7982 Long term (current) use of aspirin: Secondary | ICD-10-CM | POA: Diagnosis not present

## 2019-11-24 DIAGNOSIS — I10 Essential (primary) hypertension: Secondary | ICD-10-CM | POA: Diagnosis not present

## 2019-11-24 DIAGNOSIS — F418 Other specified anxiety disorders: Secondary | ICD-10-CM | POA: Insufficient documentation

## 2019-11-24 DIAGNOSIS — I252 Old myocardial infarction: Secondary | ICD-10-CM | POA: Diagnosis not present

## 2019-11-24 DIAGNOSIS — E785 Hyperlipidemia, unspecified: Secondary | ICD-10-CM | POA: Insufficient documentation

## 2019-11-24 DIAGNOSIS — K219 Gastro-esophageal reflux disease without esophagitis: Secondary | ICD-10-CM | POA: Diagnosis not present

## 2019-11-24 LAB — IRON AND TIBC
Iron: 86 ug/dL (ref 28–170)
Saturation Ratios: 22 % (ref 10.4–31.8)
TIBC: 391 ug/dL (ref 250–450)
UIBC: 305 ug/dL

## 2019-11-24 LAB — FERRITIN: Ferritin: 28 ng/mL (ref 11–307)

## 2019-11-24 LAB — CBC WITH DIFFERENTIAL/PLATELET
Abs Immature Granulocytes: 0.01 10*3/uL (ref 0.00–0.07)
Basophils Absolute: 0 10*3/uL (ref 0.0–0.1)
Basophils Relative: 1 %
Eosinophils Absolute: 0.2 10*3/uL (ref 0.0–0.5)
Eosinophils Relative: 3 %
HCT: 44.2 % (ref 36.0–46.0)
Hemoglobin: 14.8 g/dL (ref 12.0–15.0)
Immature Granulocytes: 0 %
Lymphocytes Relative: 33 %
Lymphs Abs: 2 10*3/uL (ref 0.7–4.0)
MCH: 31 pg (ref 26.0–34.0)
MCHC: 33.5 g/dL (ref 30.0–36.0)
MCV: 92.5 fL (ref 80.0–100.0)
Monocytes Absolute: 0.7 10*3/uL (ref 0.1–1.0)
Monocytes Relative: 12 %
Neutro Abs: 3.1 10*3/uL (ref 1.7–7.7)
Neutrophils Relative %: 51 %
Platelets: 202 10*3/uL (ref 150–400)
RBC: 4.78 MIL/uL (ref 3.87–5.11)
RDW: 12.9 % (ref 11.5–15.5)
WBC: 6 10*3/uL (ref 4.0–10.5)
nRBC: 0 % (ref 0.0–0.2)

## 2019-11-24 NOTE — Progress Notes (Signed)
Patient reports no concerns or pain today at follow up.

## 2019-11-28 ENCOUNTER — Other Ambulatory Visit: Payer: Self-pay

## 2019-11-28 ENCOUNTER — Ambulatory Visit (AMBULATORY_SURGERY_CENTER): Payer: Self-pay | Admitting: *Deleted

## 2019-11-28 VITALS — Ht 62.0 in | Wt 176.0 lb

## 2019-11-28 DIAGNOSIS — Z860100 Personal history of colon polyps, unspecified: Secondary | ICD-10-CM

## 2019-11-28 DIAGNOSIS — K22719 Barrett's esophagus with dysplasia, unspecified: Secondary | ICD-10-CM

## 2019-11-28 NOTE — Progress Notes (Signed)

## 2019-12-01 ENCOUNTER — Other Ambulatory Visit: Payer: Self-pay | Admitting: Family Medicine

## 2019-12-11 ENCOUNTER — Encounter: Payer: Medicare Other | Admitting: Gastroenterology

## 2019-12-22 ENCOUNTER — Ambulatory Visit: Payer: Medicare Other | Admitting: Family Medicine

## 2020-01-10 ENCOUNTER — Telehealth: Payer: Medicare Other | Admitting: Family Medicine

## 2020-01-23 ENCOUNTER — Telehealth: Payer: Self-pay | Admitting: Family Medicine

## 2020-01-23 NOTE — Telephone Encounter (Signed)
Left message for patient to call back and schedule Medicare Annual Wellness Visit (AWV)  ° °This should be a telephone visit only=30 minutes. ° °No hx of AWV; please schedule at anytime with Denisa O'Brien-Blaney at [place] [facility] ° ° °

## 2020-01-31 ENCOUNTER — Telehealth (INDEPENDENT_AMBULATORY_CARE_PROVIDER_SITE_OTHER): Payer: Medicare Other | Admitting: Family Medicine

## 2020-01-31 ENCOUNTER — Other Ambulatory Visit: Payer: Self-pay

## 2020-01-31 ENCOUNTER — Encounter: Payer: Self-pay | Admitting: Family Medicine

## 2020-01-31 DIAGNOSIS — I10 Essential (primary) hypertension: Secondary | ICD-10-CM | POA: Diagnosis not present

## 2020-01-31 DIAGNOSIS — D509 Iron deficiency anemia, unspecified: Secondary | ICD-10-CM | POA: Diagnosis not present

## 2020-01-31 DIAGNOSIS — E785 Hyperlipidemia, unspecified: Secondary | ICD-10-CM

## 2020-01-31 DIAGNOSIS — I2510 Atherosclerotic heart disease of native coronary artery without angina pectoris: Secondary | ICD-10-CM | POA: Diagnosis not present

## 2020-01-31 DIAGNOSIS — K2270 Barrett's esophagus without dysplasia: Secondary | ICD-10-CM

## 2020-01-31 NOTE — Assessment & Plan Note (Signed)
Check LDL and CMP.  Continue Crestor.

## 2020-01-31 NOTE — Assessment & Plan Note (Signed)
She will keep her next appointment with hematology.  I am happy to take over checking labs if she is stable at that time.

## 2020-01-31 NOTE — Assessment & Plan Note (Signed)
She will keep her scheduled EGD.

## 2020-01-31 NOTE — Assessment & Plan Note (Signed)
Asymptomatic.  Continue current regimen. 

## 2020-01-31 NOTE — Assessment & Plan Note (Signed)
Seems to be well-controlled at home.  Appears to have been elevated at hematology recently.  She is going to come in for lab work and will have a nurse check her blood pressure at that time.

## 2020-01-31 NOTE — Progress Notes (Signed)
Virtual Visit via telephone Note  This visit type was conducted due to national recommendations for restrictions regarding the COVID-19 pandemic (e.g. social distancing).  This format is felt to be most appropriate for this patient at this time.  All issues noted in this document were discussed and addressed.  No physical exam was performed (except for noted visual exam findings with Video Visits).   I connected with Debbie Delacruz today at  1:15 PM EDT by  telephone and verified that I am speaking with the correct person using two identifiers. Location patient: work Location provider: work Persons participating in the virtual visit: patient, provider  I discussed the limitations, risks, security and privacy concerns of performing an evaluation and management service by telephone and the availability of in person appointments. I also discussed with the patient that there may be a patient responsible charge related to this service. The patient expressed understanding and agreed to proceed.  Interactive audio and video telecommunications were attempted between this provider and patient, however failed, due to patient having technical difficulties OR patient did not have access to video capability.  We continued and completed visit with audio only.   Reason for visit: f/u.  HPI: HYPERTENSION  Disease Monitoring  Home BP Monitoring 135/78 Chest pain- no    Dyspnea- no Medications  Compliance-  Taking amlodipine, imdur, lisinopril, metoprolol  Edema- no  HYPERLIPIDEMIA Symptoms Chest pain on exertion:  no    Medications: Compliance- taking crestor Right upper quadrant pain- no  Muscle aches- no  Barrett's esophagus: Patient has an endoscopy scheduled for later this month.  No blood in her stool.  No reflux symptoms.  She is no longer on reflux medication.  Iron deficiency: Lab values have been relatively stable recently.  She reports hematology is going to recheck her again in 6 months  and if she is stable they will release her back to me to follow her lab results.   ROS: See pertinent positives and negatives per HPI.  Past Medical History:  Diagnosis Date  . Anemia   . Anginal pain ([place])   . Anxiety   . Arthritis   . CAD (coronary artery disease)   . Carpal tunnel syndrome    bilateral  . Cataract    bil removed cataracts  . Coronary artery disease    2x stents, Dr. Clayborn Bigness  . Depression   . Dysrhythmia   . Family history of adverse reaction to anesthesia    "brother; S/P lipotripsy in [place]; transferred to [facility]; had to put him on life support for 2 days"  . GERD (gastroesophageal reflux disease)   . Headache    "weekly" (09/13/2014)  . History of blood transfusion 05/2014   "we haven't figured out why I needed it yet" (09/13/2014)  . History of hiatal hernia   . Hyperglycemia   . Hyperlipidemia   . Hypertension   . Myocardial infarct ()   . Myocardial infarct, old 1999   15 years ago  . Obesity   . PONV (postoperative nausea and vomiting)   . S/P CABG x 4 09/18/2014   LIMA to LAD, SVG to PDA-dRCA sequentially, SVG to OM, EVH via right thigh   . Sleep apnea   . Sleep apnea    "don't use a mask" (09/13/2014)  . Unstable angina [facility])     Past Surgical History:  Procedure Laterality Date  . ABDOMINAL HYSTERECTOMY    . APPENDECTOMY    . BACK SURGERY    .  BLADDER SURGERY    . CARDIAC CATHETERIZATION  06/2014  . CARDIAC CATHETERIZATION N/A 06/16/2016   Procedure: Left Heart Cath and Cors/Grafts Angiography;  Surgeon: Burnell Blanks, MD;  Location: [place] CV LAB;  Service: Cardiovascular;  Laterality: N/A;  . CARPAL TUNNEL RELEASE Left   . CATARACT EXTRACTION W/ INTRAOCULAR LENS  IMPLANT, BILATERAL    . CHOLECYSTECTOMY    . COLONOSCOPY    . CORONARY ANGIOPLASTY WITH STENT PLACEMENT  1999   [facility] x2 stent  . CORONARY ARTERY BYPASS GRAFT N/A 09/18/2014   Procedure: CORONARY ARTERY BYPASS GRAFTING (CABG)times four using LIMA   to LAD:SVG to  PD and DIST RCA;SVG to OM;, EVH Right thigh;  Surgeon: [name], MD;  Location: [place];  Service: Open Heart Surgery;  Laterality: N/A;  . ESOPHAGOGASTRODUODENOSCOPY N/A 09/17/2014   Procedure: ESOPHAGOGASTRODUODENOSCOPY (EGD);  Surgeon: Inda Castle, MD;  Location: [place];  Service: Endoscopy;  Laterality: N/A;  . heart stents     1999  . INCONTINENCE SURGERY    . INGUINAL HERNIA REPAIR    . INGUINAL HERNIA REPAIR Right X 3   "last one was in the 1990's; still have hernia there now" (09/13/2014)  . LAPAROSCOPIC CHOLECYSTECTOMY    . LUMBAR DISC SURGERY  1990's?  . LUMBAR SPINE SURGERY    . removal of ovaries    . TEE WITHOUT CARDIOVERSION N/A 09/18/2014   Procedure: TRANSESOPHAGEAL ECHOCARDIOGRAM (TEE);  Surgeon: [name], MD;  Location: [place];  Service: Open Heart Surgery;  Laterality: N/A;  . UPPER GASTROINTESTINAL ENDOSCOPY      Family History  Problem Relation Age of Onset  . Coronary artery disease Mother   . Osteoporosis Mother   . Heart disease Mother   . Stroke Mother   . Heart attack Mother   . Hypertension Mother   . Hypertension Father   . Coronary artery disease Father   . COPD Father   . Heart disease Father   . Heart attack Father   . Glaucoma Father   . Osteoporosis Father   . Emphysema Father   . Cancer Sister 39       colon  . Hypertension Sister   . Colon cancer Sister        dx in her 50's  . Breast cancer Sister   . Colon polyps Sister   . Stroke Brother   . Hypertension Brother   . Colon polyps Brother   . Stroke Maternal Grandfather   . Stroke Maternal Grandmother   . Osteoporosis Maternal Grandmother   . Hypertension Maternal Grandmother   . Hypertension Paternal Grandmother   . Hypertension Paternal Grandfather   . Breast cancer Maternal Aunt        2 aunts-50's  . Esophageal cancer Neg Hx   . Stomach cancer Neg Hx   . Rectal cancer Neg Hx   . Pancreatic cancer Neg Hx     SOCIAL HX:  Non-smoker   Current Outpatient Medications:  .  acetaminophen (TYLENOL) 500 MG tablet, Take 500 mg by mouth every 6 (six) hours as needed for headache., Disp: , Rfl:  .  amLODipine (NORVASC) 5 MG tablet, Take 1 tablet (5 mg total) by mouth daily., Disp: 90 tablet, Rfl: 3 .  aspirin EC 81 MG tablet, Take 1 tablet (81 mg total) by mouth daily., Disp: , Rfl:  .  hydrOXYzine (ATARAX/VISTARIL) 10 MG tablet, Take 1 tablet (10 mg total) by mouth 3 (three) times daily as  needed for anxiety., Disp: 30 tablet, Rfl: 0 .  isosorbide mononitrate (IMDUR) 30 MG 24 hr tablet, Take 1 tablet (30 mg total) by mouth daily., Disp: 90 tablet, Rfl: 1 .  lisinopril (ZESTRIL) 10 MG tablet, Take 1 tablet (10 mg total) by mouth daily. Please call to schedule appt for future refills., Disp: 90 tablet, Rfl: 1 .  metoprolol succinate (TOPROL-XL) 50 MG 24 hr tablet, TAKE 1 TABLET BY MOUTH ONCE DAILY, Disp: 30 tablet, Rfl: 3 .  nitroGLYCERIN (NITROSTAT) 0.4 MG SL tablet, Place 1 tablet (0.4 mg total) under the tongue every 5 (five) minutes as needed for chest pain. Take for up to 3 doses and then call 911 if still having chest pain., Disp: 50 tablet, Rfl: 3 .  rosuvastatin (CRESTOR) 40 MG tablet, Take 1 tablet (40 mg total) by mouth daily., Disp: 90 tablet, Rfl: 1 .  Sodium Sulfate-Mag Sulfate-KCl (SUTAB PO), Take 1 kit by mouth once. Colonoscopy bowel prep, Disp: , Rfl:   EXAM: This was a telephone visit and thus no physical exam was completed.  ASSESSMENT AND PLAN:  Discussed the following assessment and plan:  CAD (coronary artery disease) Asymptomatic.  Continue current regimen.  Hypertension Seems to be well-controlled at home.  Appears to have been elevated at hematology recently.  She is going to come in for lab work and will have a nurse check her blood pressure at that time.  Barrett's esophagus She will keep her scheduled EGD.  Iron deficiency anemia She will keep her next appointment with hematology.  I  am happy to take over checking labs if she is stable at that time.   No orders of the defined types were placed in this encounter.   No orders of the defined types were placed in this encounter.    I discussed the assessment and treatment plan with the patient. The patient was provided an opportunity to ask questions and all were answered. The patient agreed with the plan and demonstrated an understanding of the instructions.   The patient was advised to call back or seek an in-person evaluation if the symptoms worsen or if the condition fails to improve as anticipated.  I provided 6 minutes of non-face-to-face time during this encounter.   Tommi Rumps, MD

## 2020-02-01 ENCOUNTER — Other Ambulatory Visit: Payer: Self-pay | Admitting: Family Medicine

## 2020-02-01 ENCOUNTER — Telehealth: Payer: Self-pay | Admitting: Family Medicine

## 2020-02-01 NOTE — Telephone Encounter (Signed)
lvm to schedule (around 02/07/2020) for Labs, nurse visit for blood pressure check, 3 months with PCP for hypertension.

## 2020-02-05 ENCOUNTER — Other Ambulatory Visit: Payer: Self-pay | Admitting: Family Medicine

## 2020-02-08 ENCOUNTER — Ambulatory Visit: Payer: Medicare Other

## 2020-02-19 ENCOUNTER — Other Ambulatory Visit: Payer: Self-pay

## 2020-02-19 ENCOUNTER — Telehealth: Payer: Self-pay | Admitting: *Deleted

## 2020-02-19 ENCOUNTER — Other Ambulatory Visit: Payer: Self-pay | Admitting: Oncology

## 2020-02-19 ENCOUNTER — Emergency Department: Payer: Medicare Other

## 2020-02-19 ENCOUNTER — Emergency Department
Admission: EM | Admit: 2020-02-19 | Discharge: 2020-02-19 | Disposition: A | Discharge status: Home / Self Care | Payer: Medicare Other | Source: Home / Self Care | Attending: Emergency Medicine | Admitting: Emergency Medicine

## 2020-02-19 DIAGNOSIS — Z951 Presence of aortocoronary bypass graft: Secondary | ICD-10-CM | POA: Diagnosis not present

## 2020-02-19 DIAGNOSIS — E669 Obesity, unspecified: Secondary | ICD-10-CM | POA: Diagnosis present

## 2020-02-19 DIAGNOSIS — Z961 Presence of intraocular lens: Secondary | ICD-10-CM | POA: Diagnosis present

## 2020-02-19 DIAGNOSIS — I252 Old myocardial infarction: Secondary | ICD-10-CM | POA: Diagnosis not present

## 2020-02-19 DIAGNOSIS — C649 Malignant neoplasm of unspecified kidney, except renal pelvis: Secondary | ICD-10-CM | POA: Insufficient documentation

## 2020-02-19 DIAGNOSIS — R109 Unspecified abdominal pain: Secondary | ICD-10-CM | POA: Diagnosis present

## 2020-02-19 DIAGNOSIS — I10 Essential (primary) hypertension: Secondary | ICD-10-CM | POA: Insufficient documentation

## 2020-02-19 DIAGNOSIS — N179 Acute kidney failure, unspecified: Secondary | ICD-10-CM | POA: Insufficient documentation

## 2020-02-19 DIAGNOSIS — E785 Hyperlipidemia, unspecified: Secondary | ICD-10-CM | POA: Diagnosis present

## 2020-02-19 DIAGNOSIS — B9620 Unspecified Escherichia coli [E. coli] as the cause of diseases classified elsewhere: Secondary | ICD-10-CM | POA: Diagnosis present

## 2020-02-19 DIAGNOSIS — E860 Dehydration: Secondary | ICD-10-CM

## 2020-02-19 DIAGNOSIS — I2510 Atherosclerotic heart disease of native coronary artery without angina pectoris: Secondary | ICD-10-CM | POA: Insufficient documentation

## 2020-02-19 DIAGNOSIS — M1990 Unspecified osteoarthritis, unspecified site: Secondary | ICD-10-CM | POA: Diagnosis present

## 2020-02-19 DIAGNOSIS — Z7982 Long term (current) use of aspirin: Secondary | ICD-10-CM | POA: Insufficient documentation

## 2020-02-19 DIAGNOSIS — E872 Acidosis: Secondary | ICD-10-CM | POA: Diagnosis present

## 2020-02-19 DIAGNOSIS — Z9841 Cataract extraction status, right eye: Secondary | ICD-10-CM | POA: Diagnosis not present

## 2020-02-19 DIAGNOSIS — N200 Calculus of kidney: Secondary | ICD-10-CM

## 2020-02-19 DIAGNOSIS — A419 Sepsis, unspecified organism: Secondary | ICD-10-CM | POA: Diagnosis present

## 2020-02-19 DIAGNOSIS — Z79899 Other long term (current) drug therapy: Secondary | ICD-10-CM | POA: Insufficient documentation

## 2020-02-19 DIAGNOSIS — Z20822 Contact with and (suspected) exposure to covid-19: Secondary | ICD-10-CM | POA: Insufficient documentation

## 2020-02-19 DIAGNOSIS — N136 Pyonephrosis: Secondary | ICD-10-CM | POA: Diagnosis present

## 2020-02-19 DIAGNOSIS — R6521 Severe sepsis with septic shock: Secondary | ICD-10-CM | POA: Diagnosis present

## 2020-02-19 DIAGNOSIS — Z6833 Body mass index (BMI) 33.0-33.9, adult: Secondary | ICD-10-CM | POA: Diagnosis not present

## 2020-02-19 DIAGNOSIS — C641 Malignant neoplasm of right kidney, except renal pelvis: Secondary | ICD-10-CM | POA: Diagnosis not present

## 2020-02-19 DIAGNOSIS — F32A Depression, unspecified: Secondary | ICD-10-CM | POA: Diagnosis present

## 2020-02-19 DIAGNOSIS — K219 Gastro-esophageal reflux disease without esophagitis: Secondary | ICD-10-CM | POA: Diagnosis present

## 2020-02-19 DIAGNOSIS — D6959 Other secondary thrombocytopenia: Secondary | ICD-10-CM | POA: Diagnosis present

## 2020-02-19 DIAGNOSIS — Z9842 Cataract extraction status, left eye: Secondary | ICD-10-CM | POA: Diagnosis not present

## 2020-02-19 DIAGNOSIS — N202 Calculus of kidney with calculus of ureter: Secondary | ICD-10-CM | POA: Diagnosis present

## 2020-02-19 DIAGNOSIS — D49519 Neoplasm of unspecified behavior of unspecified kidney: Secondary | ICD-10-CM

## 2020-02-19 DIAGNOSIS — F419 Anxiety disorder, unspecified: Secondary | ICD-10-CM | POA: Diagnosis present

## 2020-02-19 DIAGNOSIS — N2889 Other specified disorders of kidney and ureter: Secondary | ICD-10-CM | POA: Diagnosis present

## 2020-02-19 LAB — CBC
HCT: 43.1 % (ref 36.0–46.0)
Hemoglobin: 14.9 g/dL (ref 12.0–15.0)
MCH: 31.6 pg (ref 26.0–34.0)
MCHC: 34.6 g/dL (ref 30.0–36.0)
MCV: 91.5 fL (ref 80.0–100.0)
Platelets: 185 10*3/uL (ref 150–400)
RBC: 4.71 MIL/uL (ref 3.87–5.11)
RDW: 13.2 % (ref 11.5–15.5)
WBC: 9.4 10*3/uL (ref 4.0–10.5)
nRBC: 0 % (ref 0.0–0.2)

## 2020-02-19 LAB — BASIC METABOLIC PANEL
Anion gap: 10 (ref 5–15)
BUN: 29 mg/dL — ABNORMAL HIGH (ref 8–23)
CO2: 25 mmol/L (ref 22–32)
Calcium: 8.3 mg/dL — ABNORMAL LOW (ref 8.9–10.3)
Chloride: 102 mmol/L (ref 98–111)
Creatinine, Ser: 1.33 mg/dL — ABNORMAL HIGH (ref 0.44–1.00)
GFR calc Af Amer: 46 mL/min — ABNORMAL LOW (ref >60)
GFR calc non Af Amer: 40 mL/min — ABNORMAL LOW (ref >60)
Glucose, Bld: 138 mg/dL — ABNORMAL HIGH (ref 70–99)
Potassium: 3.6 mmol/L (ref 3.5–5.1)
Sodium: 137 mmol/L (ref 135–145)

## 2020-02-19 LAB — LIPASE, BLOOD: Lipase: 22 U/L (ref 11–51)

## 2020-02-19 LAB — URINALYSIS, COMPLETE (UACMP) WITH MICROSCOPIC
Bacteria, UA: NONE SEEN
Bilirubin Urine: NEGATIVE
Glucose, UA: NEGATIVE mg/dL
Hgb urine dipstick: NEGATIVE
Ketones, ur: 5 mg/dL — AB
Leukocytes,Ua: NEGATIVE
Nitrite: NEGATIVE
Protein, ur: NEGATIVE mg/dL
Specific Gravity, Urine: 1.019 (ref 1.005–1.030)
pH: 7 (ref 5.0–8.0)

## 2020-02-19 LAB — HEPATIC FUNCTION PANEL
ALT: 13 U/L (ref 0–44)
AST: 17 U/L (ref 15–41)
Albumin: 3.6 g/dL (ref 3.5–5.0)
Alkaline Phosphatase: 42 U/L (ref 38–126)
Bilirubin, Direct: 0.2 mg/dL (ref 0.0–0.2)
Indirect Bilirubin: 0.8 mg/dL (ref 0.3–0.9)
Total Bilirubin: 1 mg/dL (ref 0.3–1.2)
Total Protein: 6.8 g/dL (ref 6.5–8.1)

## 2020-02-19 LAB — RESPIRATORY PANEL BY RT PCR (FLU A&B, COVID)
Influenza A by PCR: NEGATIVE
Influenza B by PCR: NEGATIVE
SARS Coronavirus 2 by RT PCR: NEGATIVE

## 2020-02-19 MED ORDER — HYDROCODONE-ACETAMINOPHEN 5-325 MG PO TABS: 1.0000 | ORAL_TABLET | Freq: Four times a day (QID) | ORAL | 0 refills | Status: AC | PRN

## 2020-02-19 MED ORDER — ISOSORBIDE MONONITRATE ER 60 MG PO TB24
30.0000 mg | ORAL_TABLET | Freq: Every day | ORAL | Status: DC
  Administered 2020-02-19: 30 mg via ORAL
  Filled 2020-02-19: qty 1

## 2020-02-19 MED ORDER — FENTANYL CITRATE (PF) 100 MCG/2ML IJ SOLN
50.0000 ug | Freq: Once | INTRAMUSCULAR | Status: AC
  Administered 2020-02-19: 50 ug via INTRAVENOUS

## 2020-02-19 MED ORDER — LISINOPRIL 10 MG PO TABS
10.0000 mg | ORAL_TABLET | Freq: Every day | ORAL | Status: DC
  Administered 2020-02-19: 10 mg via ORAL
  Filled 2020-02-19: qty 1

## 2020-02-19 MED ORDER — AMLODIPINE BESYLATE 5 MG PO TABS
5.0000 mg | ORAL_TABLET | Freq: Every day | ORAL | Status: DC
  Administered 2020-02-19: 5 mg via ORAL
  Filled 2020-02-19: qty 1

## 2020-02-19 MED ORDER — LACTATED RINGERS IV BOLUS
1000.0000 mL | Freq: Once | INTRAVENOUS | Status: AC
  Administered 2020-02-19: 1000 mL via INTRAVENOUS

## 2020-02-19 MED ORDER — ONDANSETRON HCL 4 MG/2ML IJ SOLN
4.0000 mg | Freq: Once | INTRAMUSCULAR | Status: AC
  Administered 2020-02-19: 4 mg via INTRAVENOUS
  Filled 2020-02-19: qty 2

## 2020-02-19 MED ORDER — FENTANYL CITRATE (PF) 100 MCG/2ML IJ SOLN
INTRAMUSCULAR | Status: AC
  Filled 2020-02-19: qty 2

## 2020-02-19 MED ORDER — IOHEXOL 300 MG/ML  SOLN
75.0000 mL | Freq: Once | INTRAMUSCULAR | Status: AC | PRN
  Administered 2020-02-19: 75 mL via INTRAVENOUS

## 2020-02-19 MED ORDER — KETOROLAC TROMETHAMINE 30 MG/ML IJ SOLN
30.0000 mg | Freq: Once | INTRAMUSCULAR | Status: AC
  Administered 2020-02-19: 30 mg via INTRAVENOUS
  Filled 2020-02-19: qty 1

## 2020-02-19 MED ORDER — TAMSULOSIN HCL 0.4 MG PO CAPS: 0.4000 mg | ORAL_CAPSULE | Freq: Every day | ORAL | 0 refills | Status: AC

## 2020-02-19 MED ORDER — ONDANSETRON HCL 4 MG PO TABS: 4.0000 mg | ORAL_TABLET | Freq: Three times a day (TID) | ORAL | 0 refills | Status: DC | PRN

## 2020-02-19 NOTE — Telephone Encounter (Signed)
I spoke with patient and scheduled her for 8:45 on Friday 10/1. Thanks.

## 2020-02-19 NOTE — Telephone Encounter (Signed)
yes I will see her.  I put her on for tumor board Thursday.  Can she come Thursday afternoon or Friday?

## 2020-02-19 NOTE — ED Provider Notes (Addendum)
[facility] Emergency Department Provider Note  ____________________________________________   First MD Initiated Contact with Patient 02/19/20 1234     (approximate)  I have reviewed the triage vital signs and the nursing notes.   HISTORY  Chief Complaint No chief complaint on file.   HPI Debbie Delacruz is a 74 y.o. female with past medical history of CAD, anemia, HTN, HDL, obesity, arthritis, anxiety, depression, and OSA who presents for assessment of approximately 4 days of left lower quadrant abdominal pain rating to her left flank associated with nonbloody nonbilious emesis.  Patient denies any diarrhea or urinary symptoms but states she feels her urine has a stronger smell than usual.  She states this feels slightly like prior urinary tract infection she has had in the past but not entirely.  No other clear alleviating aggravating factors.  No headache, earache, sore throat, fevers, chills, cough, shortness of breath, extremity pain, burning with urination, blood in her stool, blood in her urine, diarrhea, rash, recent falls or injuries.  Patient stated normal bowel movement yesterday.  She denies EtOH use, illicit drug use, tobacco abuse.       Past Medical History:  Diagnosis Date   Anemia    Anginal pain (HCC)    Anxiety    Arthritis    CAD (coronary artery disease)    Carpal tunnel syndrome    bilateral   Cataract    bil removed cataracts   Coronary artery disease    2x stents, Dr. Clayborn Bigness   Depression    Dysrhythmia    Family history of adverse reaction to anesthesia    "brother; S/P lipotripsy in [place]; transferred to [facility] had to put him on life support for 2 days"   GERD (gastroesophageal reflux disease)    Headache    "weekly" (09/13/2014)   History of blood transfusion 05/2014   "we haven't figured out why I needed it yet" (09/13/2014)   History of hiatal hernia    Hyperglycemia     Hyperlipidemia    Hypertension    Myocardial infarct ([place])    Myocardial infarct, old 1999   15 years ago   Obesity    PONV (postoperative nausea and vomiting)    S/P CABG x 4 09/18/2014   LIMA to LAD, SVG to PDA-dRCA sequentially, SVG to OM, EVH via right thigh    Sleep apnea    Sleep apnea    "don't use a mask" (09/13/2014)   Unstable angina [facility])     Patient Active Problem List   Diagnosis Date Noted   Obesity (BMI 30.0-34.9) 08/30/2019   Hyperlipidemia 07/17/2019   Anxiety and depression 10/09/2015   S/P CABG x 4 09/18/2014   Barrett's esophagus 09/17/2014   GERD (gastroesophageal reflux disease)    CAD (coronary artery disease)    Hypertension 11/16/2012   Iron deficiency anemia 11/16/2012   Arthralgia 11/16/2012   Screening for breast cancer 11/16/2012   Special screening for malignant neoplasms, colon 11/16/2012   Anemia 09/14/2011   Headache(784.0) 07/30/2011   Dyslipidemia 09/15/2008   COLONIC POLYPS, ADENOMATOUS, HX OF 02/20/2008   Anxiety 11/04/2006    Past Surgical History:  Procedure Laterality Date   ABDOMINAL HYSTERECTOMY     APPENDECTOMY     BACK SURGERY     BLADDER SURGERY     CARDIAC CATHETERIZATION  06/2014   CARDIAC CATHETERIZATION N/A 06/16/2016   Procedure: Left Heart Cath and Cors/Grafts Angiography;  Surgeon: Burnell Blanks, MD;  Location: [place] [facility];  Service: Cardiovascular;  Laterality: N/A;   CARPAL TUNNEL RELEASE Left    CATARACT EXTRACTION W/ INTRAOCULAR LENS  IMPLANT, BILATERAL     CHOLECYSTECTOMY     COLONOSCOPY     CORONARY ANGIOPLASTY WITH STENT PLACEMENT  1999   [facility] x2 stent   CORONARY ARTERY BYPASS GRAFT N/A 09/18/2014   Procedure: CORONARY ARTERY BYPASS GRAFTING (CABG)times four using LIMA  to LAD:SVG to  PD and DIST RCA;SVG to OM;, EVH Right thigh;  Surgeon: [name], MD;  Location: [place];  Service: Open Heart Surgery;  Laterality: N/A;   ESOPHAGOGASTRODUODENOSCOPY N/A  09/17/2014   Procedure: ESOPHAGOGASTRODUODENOSCOPY (EGD);  Surgeon: Inda Castle, MD;  Location: [place];  Service: Endoscopy;  Laterality: N/A;   heart stents     [place] Right X 3   "last one was in the 1990's; still have hernia there now" (09/13/2014)   [place]  1990's?   LUMBAR SPINE SURGERY     removal of ovaries     TEE WITHOUT CARDIOVERSION N/A 09/18/2014   Procedure: TRANSESOPHAGEAL ECHOCARDIOGRAM (TEE);  Surgeon: [name], MD;  Location: [place];  Service: Open Heart Surgery;  Laterality: N/A;   UPPER GASTROINTESTINAL ENDOSCOPY      Prior to Admission medications   Medication Sig Start Date End Date Taking? Authorizing Provider  acetaminophen (TYLENOL) 500 MG tablet Take 500 mg by mouth every 6 (six) hours as needed for headache.    [provider]  amLODipine (NORVASC) 5 MG tablet Take 1 tablet (5 mg total) by mouth daily. 07/26/19   [name], MD  aspirin EC 81 MG tablet Take 1 tablet (81 mg total) by mouth daily. 07/09/16   Deboraha Sprang, MD  HYDROcodone-acetaminophen (NORCO) 5-325 MG tablet Take 1 tablet by mouth every 6 (six) hours as needed for up to 5 days for severe pain. 02/19/20 02/24/20  Lucrezia Starch, MD  hydrOXYzine (ATARAX/VISTARIL) 10 MG tablet Take 1 tablet (10 mg total) by mouth 3 (three) times daily as needed for anxiety. 07/24/19   [name], MD  isosorbide mononitrate (IMDUR) 30 MG 24 hr tablet Take 1 tablet (30 mg total) by mouth daily. 10/10/19   [name], MD  lisinopril (ZESTRIL) 10 MG tablet TAKE 1 TABLET BY MOUTH ONCE DAILY. PLEASE CALL TO SCHEDULE APPOINTMENT FOR FUTURE REFILLS 02/05/20   [name], MD  metoprolol succinate (TOPROL-XL) 50 MG 24 hr tablet TAKE 1 TABLET BY MOUTH ONCE DAILY 12/01/19   [name], MD  nitroGLYCERIN (NITROSTAT) 0.4 MG SL tablet Place 1 tablet  (0.4 mg total) under the tongue every 5 (five) minutes as needed for chest pain. Take for up to 3 doses and then call 911 if still having chest pain. 10/11/19   [name], MD  ondansetron (ZOFRAN) 4 MG tablet Take 1 tablet (4 mg total) by mouth every 8 (eight) hours as needed for up to 10 doses for nausea or vomiting. 02/19/20   Lucrezia Starch, MD  rosuvastatin (CRESTOR) 40 MG tablet TAKE 1 TABLET BY MOUTH ONCE DAILY 02/02/20   [name], MD  Sodium Sulfate-Mag Sulfate-KCl (SUTAB PO) Take 1 kit by mouth once. Colonoscopy bowel prep    [provider]  tamsulosin (FLOMAX) 0.4 MG CAPS capsule Take 1 capsule (  0.4 mg total) by mouth daily for 5 days. 02/19/20 02/24/20  Smith, Zachary P, MD  ° ° °Allergies °Ferrous sulfate [ferrous fumarate] and Lipitor [atorvastatin] ° °Family History  °Problem Relation Age of Onset  °• Coronary artery disease Mother   °• Osteoporosis Mother   °• Heart disease Mother   °• Stroke Mother   °• Heart attack Mother   °• Hypertension Mother   °• Hypertension Father   °• Coronary artery disease Father   °• COPD Father   °• Heart disease Father   °• Heart attack Father   °• Glaucoma Father   °• Osteoporosis Father   °• Emphysema Father   °• Cancer Sister 42  °     colon  °• Hypertension Sister   °• Colon cancer Sister   °     dx in her 40's  °• Breast cancer Sister   °• Colon polyps Sister   °• Stroke Brother   °• Hypertension Brother   °• Colon polyps Brother   °• Stroke Maternal Grandfather   °• Stroke Maternal Grandmother   °• Osteoporosis Maternal Grandmother   °• Hypertension Maternal Grandmother   °• Hypertension Paternal Grandmother   °• Hypertension Paternal Grandfather   °• Breast cancer Maternal Aunt   °     2 aunts-50's  °• Esophageal cancer Neg Hx   °• Stomach cancer Neg Hx   °• Rectal cancer Neg Hx   °• Pancreatic cancer Neg Hx   ° ° °Social History °Social History  ° °Tobacco Use  °• Smoking status: Never Smoker  °• Smokeless tobacco: Never Used    °Vaping Use  °• Vaping Use: Never used  °Substance Use Topics  °• Alcohol use: Not Currently  °  Alcohol/week: 0.0 standard drinks  °• Drug use: No  ° ° °Review of Systems ° °Review of Systems  °Constitutional: Negative for chills and fever.  °HENT: Negative for sore throat.   °Eyes: Negative for pain.  °Respiratory: Negative for cough and stridor.   °Cardiovascular: Negative for chest pain.  °Gastrointestinal: Positive for abdominal pain, nausea and vomiting.  °Genitourinary: Positive for flank pain. Negative for dysuria.  °Skin: Negative for rash.  °Neurological: Negative for seizures, loss of consciousness and headaches.  °Psychiatric/Behavioral: Negative for suicidal ideas.  °All other systems reviewed and are negative. °  ° ° °____________________________________________ ° ° °PHYSICAL EXAM: ° °VITAL SIGNS: °ED Triage Vitals  °Enc Vitals Group  °   BP 02/19/20 0613 (!) 146/75  °   Pulse Rate 02/19/20 0613 79  °   Resp 02/19/20 0613 18  °   Temp 02/19/20 0613 98.4 °F (36.9 °C)  °   Temp Source 02/19/20 0908 Oral  °   SpO2 02/19/20 0613 96 %  °   Weight 02/19/20 0613 176 lb (79.8 kg)  °   Height 02/19/20 0613 5' 2" (1.575 m)  °   Head Circumference --   °   Peak Flow --   °   Pain Score 02/19/20 0613 3  °   Pain Loc --   °   Pain Edu? --   °   Excl. in GC? --   ° °Vitals:  ° 02/19/20 1157 02/19/20 1308  °BP: (!) 173/104 (!) 163/89  °Pulse: 80   °Resp: 16   °Temp:    °SpO2: 99%   ° °Physical Exam °Vitals and nursing note reviewed.  °Constitutional:   °   General: She is not in acute distress. °     Appearance: She is well-developed.  HENT:     Head: Normocephalic and atraumatic.     Right Ear: External ear normal.     Left Ear: External ear normal.     Nose: Nose normal.     Mouth/Throat:     Mouth: Mucous membranes are moist.  Eyes:     Conjunctiva/sclera: Conjunctivae normal.  Cardiovascular:     Rate and Rhythm: Normal rate and regular rhythm.     Heart sounds: No murmur heard.   Pulmonary:      Effort: Pulmonary effort is normal. No respiratory distress.     Breath sounds: Normal breath sounds.  Abdominal:     Palpations: Abdomen is soft.     Tenderness: There is abdominal tenderness in the left lower quadrant. There is left CVA tenderness.  Musculoskeletal:     Cervical back: Neck supple.  Skin:    General: Skin is warm and dry.     Capillary Refill: Capillary refill takes less than 2 seconds.  Neurological:     Mental Status: She is alert and oriented to person, place, and time.  Psychiatric:        Mood and Affect: Mood normal.      ____________________________________________   LABS (all labs ordered are listed, but only abnormal results are displayed)  Labs Reviewed  URINALYSIS, COMPLETE (UACMP) WITH MICROSCOPIC - Abnormal; Notable for the following components:      Result Value   Color, Urine YELLOW (*)    APPearance CLOUDY (*)    Ketones, ur 5 (*)    All other components within normal limits  BASIC METABOLIC PANEL - Abnormal; Notable for the following components:   Glucose, Bld 138 (*)    BUN 29 (*)    Creatinine, Ser 1.33 (*)    Calcium 8.3 (*)    GFR calc non Af Amer 40 (*)    GFR calc Af Amer 46 (*)    All other components within normal limits  RESPIRATORY PANEL BY RT PCR (FLU A&B, COVID)  CBC  HEPATIC FUNCTION PANEL  LIPASE, BLOOD   ____________________________________________  EKG  Sinus rhythm with a ventricular of 75, normal axis, very poor tracing in anterior leads without other clear evidence of acute ischemia. ____________________________________________  RADIOLOGY   Official radiology report(s): CT ABDOMEN PELVIS W CONTRAST  Result Date: 02/19/2020 CLINICAL DATA:  LEFT flank pain radiating to LEFT groin with nausea and vomiting since Friday, tea-colored urine with strong smell EXAM: CT ABDOMEN AND PELVIS WITH CONTRAST TECHNIQUE: Multidetector CT imaging of the abdomen and pelvis was performed using the standard protocol following  bolus administration of intravenous contrast. Sagittal and coronal MPR images reconstructed from axial data set. CONTRAST:  59m OMNIPAQUE IOHEXOL 300 MG/ML SOLN IV. No oral contrast. COMPARISON:  07/26/2014 FINDINGS: Lower chest: Lung bases clear Hepatobiliary: Gallbladder surgically absent. Liver normal appearance Pancreas: Normal appearance Spleen: Normal appearance Adrenals/Urinary Tract: Adrenal glands normal appearance. Enhancing mass at superior pole RIGHT kidney measuring 4.2 x 3.5 x 3.8 cm, imaging features most consistent with a renal neoplasm. LEFT hydronephrosis and hydroureter with delay in LEFT nephrogram due to a 5 mm distal LEFT ureteral calculus very near the ureterovesical junction. No additional renal masses. RIGHT ureter decompressed. Bladder unremarkable. Stomach/Bowel: Scattered colonic diverticulosis without evidence of diverticulitis. Appendix surgically absent by history. Stomach decompressed. Bowel loops otherwise unremarkable. Vascular/Lymphatic: Atherosclerotic calcifications aorta, iliac arteries, coronary arteries. Aorta normal caliber. No adenopathy. Scattered pelvic phleboliths. Renal veins and IVC patent. Reproductive: Uterus surgically  absent.  No definite adnexal masses. Other: Mesh from prior ventral hernia repair. No free air or free fluid. No definite recurrent hernia. Musculoskeletal: Osseous demineralization. IMPRESSION: LEFT hydronephrosis and hydroureter due to a 5 mm distal LEFT ureteral calculus very near the ureterovesical junction. Enhancing 4.2 x 3.5 x 3.8 cm RIGHT upper pole renal mass most consistent with a primary renal neoplasm. Colonic diverticulosis without evidence of diverticulitis. Prior mesh ventral hernia repair without obvious recurrence. Aortic Atherosclerosis (ICD10-I70.0). Findings called to Dr. Tamala Julian on 02/19/2020 at 1408 hours. Electronically Signed   By: Lavonia Dana M.D.   On: 02/19/2020 14:09     ____________________________________________   PROCEDURES  Procedure(s) performed (including Critical Care):  Procedures   ____________________________________________   INITIAL IMPRESSION / ASSESSMENT AND PLAN / ED COURSE        Patient presents with [place] to history exam for evaluation of several days of left lower quadrant abdominal pain associate with nonbloody nonbilious vomiting.  Patient is slightly hypertensive otherwise stable vital signs arrival.  Exam as above remarkable for some mild tenderness in left lower quadrant.  Differential includes diverticulitis, kidney stone, pancreatitis, cystitis, pyelonephritis, internal hernia, and gastro enteritis.  CT obtained does show evidence of left-sided nephrolithiasis with some hydronephrosis and hydroureter secondary to 5 mm stone.  In addition patient is noted to have a mass in her right kidney concerning for neoplasm.  I did discuss with the patient both of these findings as well as my concern for possible cancer in the right kidney and importance of close oncology follow-up for this finding.  Advised patient I believed her symptoms were likely related to her kidney stone.  Her UA does not appear infected at this time.  Kidney function shows a creatinine of 1.33 with a GFR of 40 compared to a creatinine of 0.965 months ago.  This is likely secondary to some mild dehydration I have a low suspicion for intrinsic kidney injury at this time as patient's B UN is also elevated 29.  Patient was given IV fluids, antiemetics, and analgesia noted below.  Patient did note improvement in her symptoms.  Lipase is not consistent with acute pancreatitis and patient has no evidence of acute cholestasis or hepatitis.  Covid is negative.  Given findings of scan low suspicion for other emergent life-threatening pathology at this time.  Given patient tolerating p.o. with stable vital signs and otherwise reassuring work-up and wishes safe for discharge  plan for close outpatient urology and oncology follow-up.  Discharge stable condition.  Strict return precautions advised and discussed.  Rx written for Zofran, Flomax, and Percocet.   ____________________________________________   FINAL CLINICAL IMPRESSION(S) / ED DIAGNOSES  Final diagnoses:  Kidney stone  Dehydration  Renal neoplasm  AKI (acute kidney injury) ([place])  Hypertension, unspecified type    Medications  isosorbide mononitrate (IMDUR) 24 hr tablet 30 mg (30 mg Oral Given 02/19/20 1308)  amLODipine (NORVASC) tablet 5 mg (5 mg Oral Given 02/19/20 1308)  lisinopril (ZESTRIL) tablet 10 mg (10 mg Oral Given 02/19/20 1308)  lactated ringers bolus 1,000 mL (1,000 mLs Intravenous New Bag/Given 02/19/20 1309)  fentaNYL (SUBLIMAZE) injection 50 mcg (50 mcg Intravenous Given 02/19/20 1306)  iohexol (OMNIPAQUE) 300 MG/ML solution 75 mL (75 mLs Intravenous Contrast Given 02/19/20 1324)  fentaNYL (SUBLIMAZE) injection 50 mcg (50 mcg Intravenous Given 02/19/20 1401)  ketorolac (TORADOL) 30 MG/ML injection 30 mg (30 mg Intravenous Given 02/19/20 1440)  ondansetron (ZOFRAN) injection 4 mg (4 mg Intravenous Given 02/19/20 1440)  ED Discharge Orders   °      Ordered  °  tamsulosin (FLOMAX) 0.4 MG CAPS capsule  Daily       ° 02/19/20 1443  °  ondansetron (ZOFRAN) 4 MG tablet  Every 8 hours PRN       ° 02/19/20 1443  °  HYDROcodone-acetaminophen (NORCO) 5-325 MG tablet  Every 6 hours PRN       ° 02/19/20 1443  °  °  °  ° ° ° °Note:  This document was prepared using Dragon voice recognition software and may include unintentional dictation errors. °  °Smith, Zachary P, MD °02/19/20 1452 ° °  °Smith, Zachary P, MD °02/19/20 1919 ° °

## 2020-02-19 NOTE — Discharge Instructions (Addendum)
Your CT Abdomen Pelvis today showed: LEFT hydronephrosis and hydroureter due to a 5 mm distal LEFT ureteral calculus very near the ureterovesical junction. Enhancing 4.2 x 3.5 x 3.8 cm RIGHT upper pole renal mass most consistent with a primary renal neoplasm. Colonic diverticulosis without evidence of diverticulitis. Prior mesh ventral hernia repair without obvious recurrence. Aortic Atherosclerosis (ICD10-I70.0).  As we discussed I am concerned the mass noted on the right kidney could be cancer.  It is important he follows up with oncologist.

## 2020-02-19 NOTE — ED Notes (Signed)
E-signature not working at this time. Pt verbalized understanding of D/C instructions, prescriptions and follow up care with no further questions at this time. Pt in NAD and ambulatory at time of D/C.  

## 2020-02-19 NOTE — ED Triage Notes (Signed)
Patient coming [facility] from home for left flank radiating to groin and nausea/vomiting since Friday with sweet tea colored urine. Strong smell to urine.   Patient given a total of 50 mcg of fentanyl and 4 mg Zofran in EMS transport.   EMS vitals: BP 168/72, HR 80, temp 98.9, CBG 156, 99% on RA

## 2020-02-19 NOTE — Telephone Encounter (Signed)
Patient called reporting that she went to ER and was told she has a mass on her kidney. She is asking if Dr Grayland Ormond would handle this. Please advise  IMPRESSION: LEFT hydronephrosis and hydroureter due to a 5 mm distal LEFT ureteral calculus very near the ureterovesical junction.  Enhancing 4.2 x 3.5 x 3.8 cm RIGHT upper pole renal mass most consistent with a primary renal neoplasm.  Colonic diverticulosis without evidence of diverticulitis.  Prior mesh ventral hernia repair without obvious recurrence.  Aortic Atherosclerosis (ICD10-I70.0).  Findings called to Dr. Tamala Julian on 02/19/2020 at 1408 hours.   Electronically Signed   By: Lavonia Dana M.D.   On: 02/19/2020 14:09

## 2020-02-20 ENCOUNTER — Encounter
Admission: EM | Disposition: A | Discharge status: Home / Self Care | Payer: Self-pay | Source: Home / Self Care | Attending: Family Medicine

## 2020-02-20 ENCOUNTER — Other Ambulatory Visit: Payer: Self-pay

## 2020-02-20 ENCOUNTER — Inpatient Hospital Stay: Payer: Medicare Other

## 2020-02-20 ENCOUNTER — Emergency Department: Payer: Medicare Other | Admitting: Registered Nurse

## 2020-02-20 ENCOUNTER — Emergency Department: Payer: Medicare Other

## 2020-02-20 ENCOUNTER — Inpatient Hospital Stay
Admission: EM | Admit: 2020-02-20 | Discharge: 2020-02-24 | DRG: 853 | Disposition: A | Discharge status: Home / Self Care | Payer: Medicare Other | Attending: Family Medicine | Admitting: Family Medicine

## 2020-02-20 ENCOUNTER — Encounter: Payer: Self-pay | Admitting: Emergency Medicine

## 2020-02-20 DIAGNOSIS — M1990 Unspecified osteoarthritis, unspecified site: Secondary | ICD-10-CM | POA: Diagnosis present

## 2020-02-20 DIAGNOSIS — E669 Obesity, unspecified: Secondary | ICD-10-CM | POA: Diagnosis present

## 2020-02-20 DIAGNOSIS — Z87440 Personal history of urinary (tract) infections: Secondary | ICD-10-CM

## 2020-02-20 DIAGNOSIS — N202 Calculus of kidney with calculus of ureter: Secondary | ICD-10-CM | POA: Diagnosis present

## 2020-02-20 DIAGNOSIS — Z955 Presence of coronary angioplasty implant and graft: Secondary | ICD-10-CM

## 2020-02-20 DIAGNOSIS — D649 Anemia, unspecified: Secondary | ICD-10-CM | POA: Diagnosis present

## 2020-02-20 DIAGNOSIS — Z20822 Contact with and (suspected) exposure to covid-19: Secondary | ICD-10-CM | POA: Diagnosis present

## 2020-02-20 DIAGNOSIS — Z9049 Acquired absence of other specified parts of digestive tract: Secondary | ICD-10-CM

## 2020-02-20 DIAGNOSIS — I252 Old myocardial infarction: Secondary | ICD-10-CM

## 2020-02-20 DIAGNOSIS — Z961 Presence of intraocular lens: Secondary | ICD-10-CM | POA: Diagnosis present

## 2020-02-20 DIAGNOSIS — K219 Gastro-esophageal reflux disease without esophagitis: Secondary | ICD-10-CM | POA: Diagnosis present

## 2020-02-20 DIAGNOSIS — Z90710 Acquired absence of both cervix and uterus: Secondary | ICD-10-CM

## 2020-02-20 DIAGNOSIS — Z8249 Family history of ischemic heart disease and other diseases of the circulatory system: Secondary | ICD-10-CM

## 2020-02-20 DIAGNOSIS — Z951 Presence of aortocoronary bypass graft: Secondary | ICD-10-CM

## 2020-02-20 DIAGNOSIS — D6959 Other secondary thrombocytopenia: Secondary | ICD-10-CM | POA: Diagnosis present

## 2020-02-20 DIAGNOSIS — A419 Sepsis, unspecified organism: Secondary | ICD-10-CM | POA: Diagnosis not present

## 2020-02-20 DIAGNOSIS — Z79899 Other long term (current) drug therapy: Secondary | ICD-10-CM

## 2020-02-20 DIAGNOSIS — N179 Acute kidney failure, unspecified: Secondary | ICD-10-CM | POA: Diagnosis present

## 2020-02-20 DIAGNOSIS — I10 Essential (primary) hypertension: Secondary | ICD-10-CM | POA: Diagnosis present

## 2020-02-20 DIAGNOSIS — Z7982 Long term (current) use of aspirin: Secondary | ICD-10-CM

## 2020-02-20 DIAGNOSIS — R739 Hyperglycemia, unspecified: Secondary | ICD-10-CM | POA: Diagnosis present

## 2020-02-20 DIAGNOSIS — Z888 Allergy status to other drugs, medicaments and biological substances status: Secondary | ICD-10-CM

## 2020-02-20 DIAGNOSIS — N136 Pyonephrosis: Secondary | ICD-10-CM | POA: Diagnosis present

## 2020-02-20 DIAGNOSIS — E162 Hypoglycemia, unspecified: Secondary | ICD-10-CM | POA: Diagnosis present

## 2020-02-20 DIAGNOSIS — R6521 Severe sepsis with septic shock: Secondary | ICD-10-CM | POA: Diagnosis not present

## 2020-02-20 DIAGNOSIS — Z6833 Body mass index (BMI) 33.0-33.9, adult: Secondary | ICD-10-CM

## 2020-02-20 DIAGNOSIS — R109 Unspecified abdominal pain: Secondary | ICD-10-CM | POA: Diagnosis not present

## 2020-02-20 DIAGNOSIS — K2270 Barrett's esophagus without dysplasia: Secondary | ICD-10-CM | POA: Diagnosis present

## 2020-02-20 DIAGNOSIS — R9389 Abnormal findings on diagnostic imaging of other specified body structures: Secondary | ICD-10-CM

## 2020-02-20 DIAGNOSIS — N201 Calculus of ureter: Secondary | ICD-10-CM | POA: Diagnosis not present

## 2020-02-20 DIAGNOSIS — E872 Acidosis: Secondary | ICD-10-CM | POA: Diagnosis present

## 2020-02-20 DIAGNOSIS — F419 Anxiety disorder, unspecified: Secondary | ICD-10-CM | POA: Diagnosis present

## 2020-02-20 DIAGNOSIS — E785 Hyperlipidemia, unspecified: Secondary | ICD-10-CM | POA: Diagnosis present

## 2020-02-20 DIAGNOSIS — E860 Dehydration: Secondary | ICD-10-CM | POA: Diagnosis present

## 2020-02-20 DIAGNOSIS — I2510 Atherosclerotic heart disease of native coronary artery without angina pectoris: Secondary | ICD-10-CM | POA: Diagnosis present

## 2020-02-20 DIAGNOSIS — B9620 Unspecified Escherichia coli [E. coli] as the cause of diseases classified elsewhere: Secondary | ICD-10-CM | POA: Diagnosis present

## 2020-02-20 DIAGNOSIS — F32A Depression, unspecified: Secondary | ICD-10-CM | POA: Diagnosis present

## 2020-02-20 DIAGNOSIS — N2889 Other specified disorders of kidney and ureter: Secondary | ICD-10-CM | POA: Diagnosis present

## 2020-02-20 DIAGNOSIS — Z9842 Cataract extraction status, left eye: Secondary | ICD-10-CM

## 2020-02-20 DIAGNOSIS — Z9841 Cataract extraction status, right eye: Secondary | ICD-10-CM

## 2020-02-20 HISTORY — PX: CYSTOSCOPY WITH STENT PLACEMENT: SHX5790

## 2020-02-20 LAB — PROTIME-INR
INR: 1.5 — ABNORMAL HIGH (ref 0.8–1.2)
Prothrombin Time: 17.2 seconds — ABNORMAL HIGH (ref 11.4–15.2)

## 2020-02-20 LAB — URINALYSIS, ROUTINE W REFLEX MICROSCOPIC
Bilirubin Urine: NEGATIVE
Glucose, UA: NEGATIVE mg/dL
Ketones, ur: NEGATIVE mg/dL
Leukocytes,Ua: NEGATIVE
Nitrite: NEGATIVE
Protein, ur: 30 mg/dL — AB
RBC / HPF: >50 RBC/hpf — ABNORMAL HIGH (ref 0–5)
Specific Gravity, Urine: 1.01 (ref 1.005–1.030)
pH: 5 (ref 5.0–8.0)

## 2020-02-20 LAB — LACTIC ACID, PLASMA
Lactic Acid, Venous: 4.8 mmol/L (ref 0.5–1.9)
Lactic Acid, Venous: 8.1 mmol/L (ref 0.5–1.9)
Lactic Acid, Venous: 3.1 mmol/L (ref 0.5–1.9)
Lactic Acid, Venous: 2.6 mmol/L (ref 0.5–1.9)

## 2020-02-20 LAB — CBC WITH DIFFERENTIAL/PLATELET
Abs Immature Granulocytes: 0.33 10*3/uL — ABNORMAL HIGH (ref 0.00–0.07)
Basophils Absolute: 0.1 10*3/uL (ref 0.0–0.1)
Basophils Relative: 0 %
Eosinophils Absolute: 0 10*3/uL (ref 0.0–0.5)
Eosinophils Relative: 0 %
HCT: 38.8 % (ref 36.0–46.0)
Hemoglobin: 13.4 g/dL (ref 12.0–15.0)
Immature Granulocytes: 2 %
Lymphocytes Relative: 4 %
Lymphs Abs: 0.7 10*3/uL (ref 0.7–4.0)
MCH: 31.8 pg (ref 26.0–34.0)
MCHC: 34.5 g/dL (ref 30.0–36.0)
MCV: 92.2 fL (ref 80.0–100.0)
Monocytes Absolute: 1 10*3/uL (ref 0.1–1.0)
Monocytes Relative: 5 %
Neutro Abs: 17.6 10*3/uL — ABNORMAL HIGH (ref 1.7–7.7)
Neutrophils Relative %: 89 %
Platelets: 116 10*3/uL — ABNORMAL LOW (ref 150–400)
RBC: 4.21 MIL/uL (ref 3.87–5.11)
RDW: 13.7 % (ref 11.5–15.5)
Smear Review: NORMAL
WBC: 19.7 10*3/uL — ABNORMAL HIGH (ref 4.0–10.5)
nRBC: 0 % (ref 0.0–0.2)

## 2020-02-20 LAB — URINALYSIS, COMPLETE (UACMP) WITH MICROSCOPIC
Glucose, UA: NEGATIVE mg/dL
Ketones, ur: NEGATIVE mg/dL
Nitrite: NEGATIVE
Protein, ur: 100 mg/dL — AB
Specific Gravity, Urine: 1.039 — ABNORMAL HIGH (ref 1.005–1.030)
Squamous Epithelial / HPF: NONE SEEN (ref 0–5)
WBC, UA: >50 WBC/hpf — ABNORMAL HIGH (ref 0–5)
pH: 5 (ref 5.0–8.0)

## 2020-02-20 LAB — COMPREHENSIVE METABOLIC PANEL
ALT: 76 U/L — ABNORMAL HIGH (ref 0–44)
AST: 106 U/L — ABNORMAL HIGH (ref 15–41)
Albumin: 3.2 g/dL — ABNORMAL LOW (ref 3.5–5.0)
Alkaline Phosphatase: 80 U/L (ref 38–126)
Anion gap: 14 (ref 5–15)
BUN: 46 mg/dL — ABNORMAL HIGH (ref 8–23)
CO2: 20 mmol/L — ABNORMAL LOW (ref 22–32)
Calcium: 8 mg/dL — ABNORMAL LOW (ref 8.9–10.3)
Chloride: 101 mmol/L (ref 98–111)
Creatinine, Ser: 3.25 mg/dL — ABNORMAL HIGH (ref 0.44–1.00)
GFR calc Af Amer: 16 mL/min — ABNORMAL LOW (ref >60)
GFR calc non Af Amer: 14 mL/min — ABNORMAL LOW (ref >60)
Glucose, Bld: 90 mg/dL (ref 70–99)
Potassium: 3.2 mmol/L — ABNORMAL LOW (ref 3.5–5.1)
Sodium: 135 mmol/L (ref 135–145)
Total Bilirubin: 1.3 mg/dL — ABNORMAL HIGH (ref 0.3–1.2)
Total Protein: 6 g/dL — ABNORMAL LOW (ref 6.5–8.1)

## 2020-02-20 LAB — POTASSIUM: Potassium: 4.1 mmol/L (ref 3.5–5.1)

## 2020-02-20 LAB — CK: Total CK: 82 U/L (ref 38–234)

## 2020-02-20 LAB — PROCALCITONIN: Procalcitonin: >150 ng/mL

## 2020-02-20 LAB — MAGNESIUM: Magnesium: 1.5 mg/dL — ABNORMAL LOW (ref 1.7–2.4)

## 2020-02-20 LAB — APTT: aPTT: 36 seconds (ref 24–36)

## 2020-02-20 SURGERY — CYSTOSCOPY, WITH STENT INSERTION
Anesthesia: General | Laterality: Left

## 2020-02-20 MED ORDER — SUCCINYLCHOLINE CHLORIDE 20 MG/ML IJ SOLN
INTRAMUSCULAR | Status: DC | PRN
  Administered 2020-02-20: 80 mg via INTRAVENOUS

## 2020-02-20 MED ORDER — ACETAMINOPHEN 10 MG/ML IV SOLN
INTRAVENOUS | Status: AC
  Administered 2020-02-20: 1000 mg via INTRAVENOUS
  Filled 2020-02-20: qty 100

## 2020-02-20 MED ORDER — HEPARIN SODIUM (PORCINE) 5000 UNIT/ML IJ SOLN
INTRAMUSCULAR | Status: AC
  Administered 2020-02-20: 5000 [IU]
  Filled 2020-02-20: qty 1

## 2020-02-20 MED ORDER — SODIUM CHLORIDE 0.9 % IV SOLN
1.0000 g | INTRAVENOUS | Status: DC
  Administered 2020-02-20: 1 g via INTRAVENOUS
  Filled 2020-02-20 (×2): qty 10

## 2020-02-20 MED ORDER — NOREPINEPHRINE 4 MG/250ML-% IV SOLN
2.0000 ug/min | INTRAVENOUS | Status: DC
  Filled 2020-02-20: qty 250

## 2020-02-20 MED ORDER — DOCUSATE SODIUM 100 MG PO CAPS
100.0000 mg | ORAL_CAPSULE | Freq: Two times a day (BID) | ORAL | Status: DC | PRN
  Filled 2020-02-20: qty 1

## 2020-02-20 MED ORDER — VASOPRESSIN 20 UNITS/100 ML INFUSION FOR SHOCK
0.0000 [IU]/min | INTRAVENOUS | Status: DC
  Filled 2020-02-20: qty 100

## 2020-02-20 MED ORDER — ETOMIDATE 2 MG/ML IV SOLN
INTRAVENOUS | Status: DC | PRN
  Administered 2020-02-20: 12 mg via INTRAVENOUS

## 2020-02-20 MED ORDER — ROCURONIUM BROMIDE 100 MG/10ML IV SOLN
INTRAVENOUS | Status: DC | PRN
  Administered 2020-02-20: 35 mg via INTRAVENOUS

## 2020-02-20 MED ORDER — SODIUM CHLORIDE 0.9 % IV SOLN
250.0000 mL | INTRAVENOUS | Status: DC
  Administered 2020-02-21: 250 mL via INTRAVENOUS

## 2020-02-20 MED ORDER — LACTATED RINGERS IV SOLN
INTRAVENOUS | Status: AC
  Administered 2020-02-20 – 2020-02-21 (×2): 1000 mL via INTRAVENOUS

## 2020-02-20 MED ORDER — ONDANSETRON HCL 4 MG/2ML IJ SOLN
INTRAMUSCULAR | Status: AC
  Filled 2020-02-20: qty 2

## 2020-02-20 MED ORDER — FENTANYL CITRATE (PF) 100 MCG/2ML IJ SOLN
INTRAMUSCULAR | Status: AC
  Filled 2020-02-20: qty 2

## 2020-02-20 MED ORDER — SEVOFLURANE IN SOLN
RESPIRATORY_TRACT | Status: AC
  Filled 2020-02-20: qty 250

## 2020-02-20 MED ORDER — NOREPINEPHRINE BITARTRATE 1 MG/ML IV SOLN
INTRAVENOUS | Status: AC
  Filled 2020-02-20: qty 4

## 2020-02-20 MED ORDER — POLYETHYLENE GLYCOL 3350 17 G PO PACK
17.0000 g | PACK | Freq: Every day | ORAL | Status: DC | PRN
  Filled 2020-02-20: qty 1

## 2020-02-20 MED ORDER — SODIUM CHLORIDE 0.9 % IV BOLUS
1000.0000 mL | Freq: Once | INTRAVENOUS | Status: AC
  Administered 2020-02-20: 1000 mL via INTRAVENOUS

## 2020-02-20 MED ORDER — SUGAMMADEX SODIUM 200 MG/2ML IV SOLN
INTRAVENOUS | Status: DC | PRN
  Administered 2020-02-20: 200 mg via INTRAVENOUS

## 2020-02-20 MED ORDER — DEXAMETHASONE SODIUM PHOSPHATE 10 MG/ML IJ SOLN
INTRAMUSCULAR | Status: DC | PRN
  Administered 2020-02-20: 10 mg via INTRAVENOUS

## 2020-02-20 MED ORDER — SUCCINYLCHOLINE CHLORIDE 200 MG/10ML IV SOSY
PREFILLED_SYRINGE | INTRAVENOUS | Status: AC
  Filled 2020-02-20: qty 10

## 2020-02-20 MED ORDER — LACTATED RINGERS IV BOLUS: 1000.0000 mL | Freq: Once | INTRAVENOUS | Status: DC

## 2020-02-20 MED ORDER — NYSTATIN 100000 UNIT/GM EX POWD
Freq: Two times a day (BID) | CUTANEOUS | Status: DC
  Filled 2020-02-20: qty 15

## 2020-02-20 MED ORDER — PIPERACILLIN-TAZOBACTAM 3.375 G IVPB 30 MIN
3.3750 g | Freq: Once | INTRAVENOUS | Status: AC
  Administered 2020-02-20: 3.375 g via INTRAVENOUS
  Filled 2020-02-20: qty 50

## 2020-02-20 MED ORDER — DEXAMETHASONE SODIUM PHOSPHATE 10 MG/ML IJ SOLN
INTRAMUSCULAR | Status: AC
  Filled 2020-02-20: qty 1

## 2020-02-20 MED ORDER — ACETAMINOPHEN 10 MG/ML IV SOLN: 1000.0000 mg | Freq: Once | INTRAVENOUS | Status: AC

## 2020-02-20 MED ORDER — MAGNESIUM SULFATE 4 GM/100ML IV SOLN
4.0000 g | Freq: Once | INTRAVENOUS | Status: AC
  Administered 2020-02-20: 4 g via INTRAVENOUS
  Filled 2020-02-20: qty 100

## 2020-02-20 MED ORDER — VASOPRESSIN 20 UNIT/ML IV SOLN
INTRAVENOUS | Status: AC
  Filled 2020-02-20: qty 1

## 2020-02-20 MED ORDER — LACTATED RINGERS IV SOLN: INTRAVENOUS | Status: DC | PRN

## 2020-02-20 MED ORDER — SODIUM CHLORIDE 0.9% FLUSH
10.0000 mL | Freq: Two times a day (BID) | INTRAVENOUS | Status: DC
  Administered 2020-02-20 – 2020-02-24 (×7): 10 mL

## 2020-02-20 MED ORDER — LACTATED RINGERS IV BOLUS (SEPSIS)
1000.0000 mL | Freq: Once | INTRAVENOUS | Status: AC
  Administered 2020-02-20: 1000 mL via INTRAVENOUS

## 2020-02-20 MED ORDER — ONDANSETRON HCL 4 MG/2ML IJ SOLN: 4.0000 mg | Freq: Once | INTRAMUSCULAR | Status: DC | PRN

## 2020-02-20 MED ORDER — IOHEXOL 180 MG/ML  SOLN
INTRAMUSCULAR | Status: DC | PRN
  Administered 2020-02-20: 20 mL

## 2020-02-20 MED ORDER — SODIUM CHLORIDE 0.9% FLUSH: 10.0000 mL | INTRAVENOUS | Status: DC | PRN

## 2020-02-20 MED ORDER — LIDOCAINE HCL (PF) 2 % IJ SOLN
INTRAMUSCULAR | Status: AC
  Filled 2020-02-20: qty 5

## 2020-02-20 MED ORDER — NOREPINEPHRINE 4 MG/250ML-% IV SOLN
INTRAVENOUS | Status: DC | PRN
  Administered 2020-02-20: .05 ug/kg/min via INTRAVENOUS

## 2020-02-20 MED ORDER — HEPARIN SODIUM (PORCINE) 5000 UNIT/ML IJ SOLN
5000.0000 [IU] | Freq: Three times a day (TID) | INTRAMUSCULAR | Status: DC
  Administered 2020-02-21 – 2020-02-24 (×6): 5000 [IU] via SUBCUTANEOUS
  Filled 2020-02-20 (×6): qty 1

## 2020-02-20 MED ORDER — FENTANYL CITRATE (PF) 100 MCG/2ML IJ SOLN: 25.0000 ug | INTRAMUSCULAR | Status: DC | PRN

## 2020-02-20 MED ORDER — 0.9 % SODIUM CHLORIDE (POUR BTL) OPTIME
TOPICAL | Status: DC | PRN
  Administered 2020-02-20: 3000 mL

## 2020-02-20 MED ORDER — ONDANSETRON HCL 4 MG/2ML IJ SOLN
INTRAMUSCULAR | Status: DC | PRN
  Administered 2020-02-20: 4 mg via INTRAVENOUS

## 2020-02-20 MED ORDER — ROCURONIUM BROMIDE 10 MG/ML (PF) SYRINGE
PREFILLED_SYRINGE | INTRAVENOUS | Status: AC
  Filled 2020-02-20: qty 10

## 2020-02-20 MED ORDER — SODIUM CHLORIDE (PF) 0.9 % IJ SOLN
INTRAMUSCULAR | Status: AC
  Filled 2020-02-20: qty 20

## 2020-02-20 MED ORDER — ETOMIDATE 2 MG/ML IV SOLN
INTRAVENOUS | Status: AC
  Filled 2020-02-20: qty 10

## 2020-02-20 MED ORDER — FENTANYL CITRATE (PF) 250 MCG/5ML IJ SOLN
INTRAMUSCULAR | Status: DC | PRN
Start: 2020-02-20 — End: 2020-02-20
  Administered 2020-02-20 (×2): 50 ug via INTRAVENOUS

## 2020-02-20 MED ORDER — LACTATED RINGERS IV BOLUS (SEPSIS)
500.0000 mL | Freq: Once | INTRAVENOUS | Status: AC
  Administered 2020-02-20: 500 mL via INTRAVENOUS

## 2020-02-20 MED ORDER — NOREPINEPHRINE BITARTRATE 1 MG/ML IV SOLN
INTRAVENOUS | Status: DC | PRN
  Administered 2020-02-20 (×3): 1 mL via INTRAVENOUS
  Administered 2020-02-20: 2 mL via INTRAVENOUS
  Administered 2020-02-20: 1 mL via INTRAVENOUS

## 2020-02-20 MED ORDER — LIDOCAINE HCL (CARDIAC) PF 100 MG/5ML IV SOSY
PREFILLED_SYRINGE | INTRAVENOUS | Status: DC | PRN
  Administered 2020-02-20: 60 mg via INTRAVENOUS

## 2020-02-20 MED ORDER — NOREPINEPHRINE 4 MG/250ML-% IV SOLN
0.0000 ug/min | INTRAVENOUS | Status: DC
  Filled 2020-02-20: qty 250

## 2020-02-20 MED ORDER — MIDODRINE HCL 5 MG PO TABS
10.0000 mg | ORAL_TABLET | Freq: Three times a day (TID) | ORAL | Status: DC
  Administered 2020-02-20 – 2020-02-21 (×2): 10 mg via ORAL
  Filled 2020-02-20 (×4): qty 2

## 2020-02-20 MED ORDER — PHENYLEPHRINE HCL (PRESSORS) 10 MG/ML IV SOLN
INTRAVENOUS | Status: DC | PRN
  Administered 2020-02-20 (×3): 200 ug via INTRAVENOUS
  Administered 2020-02-20: 100 ug via INTRAVENOUS

## 2020-02-20 SURGICAL SUPPLY — 25 items
BAG DRAIN CYSTO-URO LG1000N (MISCELLANEOUS) ×3 IMPLANT
BAG DRN RND TRDRP ANRFLXCHMBR (UROLOGICAL SUPPLIES) ×1
BAG URINE DRAIN 2000ML AR STRL (UROLOGICAL SUPPLIES) ×3 IMPLANT
BRUSH SCRUB EZ 1% IODOPHOR (MISCELLANEOUS) IMPLANT
CATH FOL 2WAY LX 18X30 (CATHETERS) ×6 IMPLANT
CATH URETL 5X70 OPEN END (CATHETERS) ×6 IMPLANT
CONRAY 43 FOR UROLOGY 50M (MISCELLANEOUS) ×3 IMPLANT
GLOVE BIOGEL PI IND STRL 7.5 (GLOVE) ×1 IMPLANT
GLOVE BIOGEL PI INDICATOR 7.5 (GLOVE) ×2
GOWN STRL REUS W/ TWL LRG LVL3 (GOWN DISPOSABLE) ×1 IMPLANT
GOWN STRL REUS W/ TWL XL LVL3 (GOWN DISPOSABLE) ×1 IMPLANT
GOWN STRL REUS W/TWL LRG LVL3 (GOWN DISPOSABLE) ×3
GOWN STRL REUS W/TWL XL LVL3 (GOWN DISPOSABLE) ×3
GUIDEWIRE STR DUAL SENSOR (WIRE) ×3 IMPLANT
KIT TURNOVER CYSTO (KITS) ×3 IMPLANT
PACK CYSTO AR (MISCELLANEOUS) ×3 IMPLANT
SET CYSTO W/LG BORE CLAMP LF (SET/KITS/TRAYS/PACK) ×3 IMPLANT
SOL .9 NS 3000ML IRR  AL (IV SOLUTION) ×3
SOL .9 NS 3000ML IRR UROMATIC (IV SOLUTION) ×1 IMPLANT
STENT URET 6FRX24 CONTOUR (STENTS) ×3 IMPLANT
STENT URET 6FRX26 CONTOUR (STENTS) IMPLANT
SURGILUBE 2OZ TUBE FLIPTOP (MISCELLANEOUS) ×3 IMPLANT
SYR TOOMEY IRRIG 70ML (MISCELLANEOUS)
SYRINGE TOOMEY IRRIG 70ML (MISCELLANEOUS) IMPLANT
WATER STERILE IRR 1000ML POUR (IV SOLUTION) ×3 IMPLANT

## 2020-02-20 NOTE — Anesthesia Procedure Notes (Signed)
Procedure Name: Intubation Date/Time: 02/20/2020 2:27 PM Performed by: Eben Burow, CRNA Pre-anesthesia Checklist: Patient identified, Emergency Drugs available, Suction available and Patient being monitored Patient Re-evaluated:Patient Re-evaluated prior to induction Oxygen Delivery Method: Circle system utilized Preoxygenation: Pre-oxygenation with 100% oxygen Induction Type: IV induction, Cricoid Pressure applied and Rapid sequence Ventilation: Mask ventilation without difficulty Laryngoscope Size: Mac, 3 and McGraph Tube type: Oral Tube size: 7.0 mm Number of attempts: 1 Airway Equipment and Method: Stylet and Oral airway Placement Confirmation: ETT inserted through vocal cords under direct vision,  positive ETCO2 and breath sounds checked- equal and bilateral Secured at: 21 cm Tube secured with: Tape Dental Injury: Teeth and Oropharynx as per pre-operative assessment

## 2020-02-20 NOTE — ED Triage Notes (Signed)
Pt in via [facility] w/weakness and hypotension. Seen here yesterday and dx w/19mm L kidney stone, AKI, and hypertension. States she felt no better at home. Multiple episodes of diarrhea since, poor PO intake and worsened pain today. BP 70/40 en route, given 334ml NS en route. Oral temp 98.5

## 2020-02-20 NOTE — H&P (Signed)
CRITICAL CARE H&P    Name: Debbie Delacruz MRN: [id number] DOB: 27-Mar-1946     LOS: 0   SUBJECTIVE FINDINGS & SIGNIFICANT EVENTS    Patient description:   This is a pleasant 74 yo F who is a never smoker, she has a hx of GERD with Barrets esophagus, dyslipidemia, HTN, anxiety disorder, chronic anemia and headaches who came in due to altered mental status with confusion, hypotension and dysuria. She was recently seen by urology for nephrolithiasis as well as a renal mass measuring appx 4cm. She was seen by urology while in ED and taken to OR for left distal ureteral stent placement. Post operatively she remained profoundly hypotensive with shock requiring vasopressor support despite aggressive IVF. She is admitted to MICU for septic shock.  Lines/tubes : Arterial Line 02/20/20 Left Radial (Active)  Site Assessment Clean;Intact;Dry 02/20/20 1554  Line Status Pulsatile blood flow 02/20/20 1554  Art Line Waveform Appropriate 02/20/20 1554  Art Line Interventions Zeroed and calibrated 02/20/20 1521  Color/Movement/Sensation Capillary refill less than 3 sec 02/20/20 1554  Dressing Type Transparent;Securing device 02/20/20 1554  Dressing Status Clean;Dry;Intact 02/20/20 1554     Urethral Catheter Sninsky MD Straight-tip;Latex 18 Fr. (Active)  Indication for Insertion or Continuance of Catheter Peri-operative use for selective surgical procedure - not to exceed 24 hours post-op 02/20/20 1554  Site Assessment Clean;Intact 02/20/20 1554  Catheter Maintenance Bag below level of bladder;Catheter secured;Drainage bag/tubing not touching floor;No dependent loops 02/20/20 1554  Collection Container Standard drainage bag 02/20/20 1554  Securement Method Leg strap 02/20/20 1554     Ureteral Drain/Stent Left ureter 6 Fr.  (Active)    Microbiology/Sepsis markers: Results for orders placed or performed during the hospital encounter of 02/19/20  Respiratory Panel by RT PCR (Flu A&B, Covid) - Nasopharyngeal Swab     Status: None   Collection Time: 02/19/20 12:47 PM   Specimen: Nasopharyngeal Swab  Result Value Ref Range Status   SARS Coronavirus 2 by RT PCR NEGATIVE NEGATIVE Final    Comment: (NOTE) SARS-CoV-2 target nucleic acids are NOT DETECTED.  The SARS-CoV-2 RNA is generally detectable in upper respiratoy specimens during the acute phase of infection. The lowest concentration of SARS-CoV-2 viral copies this assay can detect is 131 copies/mL. A negative result does not preclude SARS-Cov-2 infection and should not be used as the sole basis for treatment or other patient management decisions. A negative result may occur with  improper specimen collection/handling, submission of specimen other than nasopharyngeal swab, presence of viral mutation(s) within the areas targeted by this assay, and inadequate number of viral copies (<131 copies/mL). A negative result must be combined with clinical observations, patient history, and epidemiological information. The expected result is Negative.  Fact Sheet for Patients:  PinkCheek.be  Fact Sheet for Healthcare Providers:  GravelBags.it  This test is no t yet approved or cleared by the [place] FDA and  has been authorized for detection and/or diagnosis of SARS-CoV-2 by FDA under an Emergency Use Authorization (EUA). This EUA will remain  in effect (meaning this test can be used) for the duration of the COVID-19 declaration under Section 564(b)(1) of the Act, 21 U.S.C. section 360bbb-3(b)(1), unless the authorization is terminated or revoked sooner.     Influenza A by PCR NEGATIVE NEGATIVE Final   Influenza B by PCR NEGATIVE NEGATIVE Final    Comment: (NOTE) The Xpert Xpress  SARS-CoV-2/FLU/RSV assay is intended as an aid in  the diagnosis of influenza from Nasopharyngeal swab specimens  and  should not be used as a sole basis for treatment. Nasal washings and  aspirates are unacceptable for Xpert Xpress SARS-CoV-2/FLU/RSV  testing.  Fact Sheet for Patients: PinkCheek.be  Fact Sheet for Healthcare Providers: GravelBags.it  This test is not yet approved or cleared by the [place] FDA and  has been authorized for detection and/or diagnosis of SARS-CoV-2 by  FDA under an Emergency Use Authorization (EUA). This EUA will remain  in effect (meaning this test can be used) for the duration of the  Covid-19 declaration under Section 564(b)(1) of the Act, 21  U.S.C. section 360bbb-3(b)(1), unless the authorization is  terminated or revoked. Performed at [facility], [address] [place], [place] [address]     Anti-infectives:  Anti-infectives (From admission, onward)   Start     Dose/Rate Route Frequency Ordered Stop   02/20/20 1315  piperacillin-tazobactam (ZOSYN) IVPB 3.375 g        3.375 g 100 mL/hr over 30 Minutes Intravenous  Once 02/20/20 1309 02/20/20 1431   02/20/20 1100  cefTRIAXone (ROCEPHIN) 1 g in sodium chloride 0.9 % 100 mL IVPB        1 g 200 mL/hr over 30 Minutes Intravenous Every 24 hours 02/20/20 1050         Consults: Treatment Team:  Billey Co, MD Ottie Glazier, MD    PAST MEDICAL HISTORY   Past Medical History:  Diagnosis Date  . Anemia   . Anginal pain ([place])   . Anxiety   . Arthritis   . CAD (coronary artery disease)   . Carpal tunnel syndrome    bilateral  . Cataract    bil removed cataracts  . Coronary artery disease    2x stents, Dr. Clayborn Bigness  . Depression   . Dysrhythmia   . Family history of adverse reaction to anesthesia    "brother; S/P lipotripsy in [place]; transferred to [facility]; had to put him on life support for  2 days"  . GERD (gastroesophageal reflux disease)   . Headache    "weekly" (09/13/2014)  . History of blood transfusion 05/2014   "we haven't figured out why I needed it yet" (09/13/2014)  . History of hiatal hernia   . Hyperglycemia   . Hyperlipidemia   . Hypertension   . Myocardial infarct ([place])   . Myocardial infarct, old 1999   15 years ago  . Obesity   . PONV (postoperative nausea and vomiting)   . S/P CABG x 4 09/18/2014   LIMA to LAD, SVG to PDA-dRCA sequentially, SVG to OM, EVH via right thigh   . Sleep apnea   . Sleep apnea    "don't use a mask" (09/13/2014)  . Unstable angina [facility])      SURGICAL HISTORY   Past Surgical History:  Procedure Laterality Date  . ABDOMINAL HYSTERECTOMY    . APPENDECTOMY    . BACK SURGERY    . BLADDER SURGERY    . CARDIAC CATHETERIZATION  06/2014  . CARDIAC CATHETERIZATION N/A 06/16/2016   Procedure: Left Heart Cath and Cors/Grafts Angiography;  Surgeon: Burnell Blanks, MD;  Location: [place] CV [facility];  Service: Cardiovascular;  Laterality: N/A;  . CARPAL TUNNEL RELEASE Left   . CATARACT EXTRACTION W/ INTRAOCULAR LENS  IMPLANT, BILATERAL    . CHOLECYSTECTOMY    . COLONOSCOPY    . CORONARY ANGIOPLASTY WITH STENT PLACEMENT  1999   [facility] x2 stent  . CORONARY ARTERY BYPASS GRAFT N/A 09/18/2014  Procedure: CORONARY ARTERY BYPASS GRAFTING (CABG)times four using LIMA  to LAD:SVG to  PD and DIST RCA;SVG to OM;, EVH Right thigh;  Surgeon: [name], MD;  Location: [place];  Service: Open Heart Surgery;  Laterality: N/A;  . ESOPHAGOGASTRODUODENOSCOPY N/A 09/17/2014   Procedure: ESOPHAGOGASTRODUODENOSCOPY (EGD);  Surgeon: Inda Castle, MD;  Location: [place];  Service: Endoscopy;  Laterality: N/A;  . heart stents     1999  . INCONTINENCE SURGERY    . INGUINAL HERNIA REPAIR    . INGUINAL HERNIA REPAIR Right X 3   "last one was in the 1990's; still have hernia there now" (09/13/2014)  . LAPAROSCOPIC CHOLECYSTECTOMY    . LUMBAR  DISC SURGERY  1990's?  . LUMBAR SPINE SURGERY    . removal of ovaries    . TEE WITHOUT CARDIOVERSION N/A 09/18/2014   Procedure: TRANSESOPHAGEAL ECHOCARDIOGRAM (TEE);  Surgeon: [name], MD;  Location: [place];  Service: Open Heart Surgery;  Laterality: N/A;  . UPPER GASTROINTESTINAL ENDOSCOPY       FAMILY HISTORY   Family History  Problem Relation Age of Onset  . Coronary artery disease Mother   . Osteoporosis Mother   . Heart disease Mother   . Stroke Mother   . Heart attack Mother   . Hypertension Mother   . Hypertension Father   . Coronary artery disease Father   . COPD Father   . Heart disease Father   . Heart attack Father   . Glaucoma Father   . Osteoporosis Father   . Emphysema Father   . Cancer Sister 53       colon  . Hypertension Sister   . Colon cancer Sister        dx in her 77's  . Breast cancer Sister   . Colon polyps Sister   . Stroke Brother   . Hypertension Brother   . Colon polyps Brother   . Stroke Maternal Grandfather   . Stroke Maternal Grandmother   . Osteoporosis Maternal Grandmother   . Hypertension Maternal Grandmother   . Hypertension Paternal Grandmother   . Hypertension Paternal Grandfather   . Breast cancer Maternal Aunt        2 aunts-50's  . Esophageal cancer Neg Hx   . Stomach cancer Neg Hx   . Rectal cancer Neg Hx   . Pancreatic cancer Neg Hx      SOCIAL HISTORY   Social History   Tobacco Use  . Smoking status: Never Smoker  . Smokeless tobacco: Never Used  Vaping Use  . Vaping Use: Never used  Substance Use Topics  . Alcohol use: Not Currently    Alcohol/week: 0.0 standard drinks  . Drug use: No     MEDICATIONS   Current Medication:  Current Facility-Administered Medications:  .  0.9 %  sodium chloride infusion, 250 mL, Intravenous, Continuous, Carrie Mew, MD .  cefTRIAXone (ROCEPHIN) 1 g in sodium chloride 0.9 % 100 mL IVPB, 1 g, Intravenous, Q24H, Carrie Mew, MD, Stopped at 02/20/20  1207 .  lactated ringers infusion, , Intravenous, Continuous, Carrie Mew, MD, Last Rate: 150 mL/hr at 02/20/20 1405, Restarted at 02/20/20 1451    ALLERGIES   Ferrous sulfate [ferrous fumarate] and Lipitor [atorvastatin]    REVIEW OF SYSTEMS     10 point ROS done and is + for malaise but negative for pain, sob, confusion.   PHYSICAL EXAMINATION   Vital Signs: Temp:  [99.7 F (37.6 C)-101.7 F (38.7 C)]  99.7 F (37.6 C) (09/28 1550) Pulse Rate:  [77-175] 125 (09/28 1550) Resp:  [21-42] 21 (09/28 1550) BP: (75-115)/(45-63) 75/50 (09/28 1500) SpO2:  [93 %-100 %] 97 % (09/28 1550) Arterial Line BP: (79-99)/(28-45) 79/43 (09/28 1550) Weight:  [79.8 kg] 79.8 kg (09/28 1059)  GENERAL:age appropriate in mild distress related to acutely ill state HEAD: Normocephalic, atraumatic.  EYES: Pupils equal, round, reactive to light.  No scleral icterus.  MOUTH: Moist mucosal membrane. NECK: Supple. No thyromegaly. No nodules. No JVD.  PULMONARY: mild bibasilar crackles CARDIOVASCULAR: S1 and S2. Regular rate and rhythm. No murmurs, rubs, or gallops.  GASTROINTESTINAL: Soft, nontender, non-distended. No masses. Positive bowel sounds. No hepatosplenomegaly.  MUSCULOSKELETAL: No swelling, clubbing, or edema.  NEUROLOGIC: Mild distress due to acute illness no FND on examination SKIN:intact,warm,dry   PERTINENT DATA     Infusions: . sodium chloride    . cefTRIAXone (ROCEPHIN)  IV Stopped (02/20/20 1207)  . lactated ringers 150 mL/hr at 02/20/20 1405   Scheduled Medications:  PRN Medications:  Hemodynamic parameters:   Intake/Output: No intake/output data recorded.  Ventilator  Settings:    LAB RESULTS:  Basic Metabolic Panel: Recent Labs  Lab 02/19/20 0615 02/20/20 1131  NA 137 135  K 3.6 3.2*  CL 102 101  CO2 25 20*  GLUCOSE 138* 90  BUN 29* 46*  CREATININE 1.33* 3.25*  CALCIUM 8.3* 8.0*   Liver Function Tests: Recent Labs  Lab 02/19/20 0615  02/20/20 1131  AST 17 106*  ALT 13 76*  ALKPHOS 42 80  BILITOT 1.0 1.3*  PROT 6.8 6.0*  ALBUMIN 3.6 3.2*   Recent Labs  Lab 02/19/20 0615  LIPASE 22   No results for input(s): AMMONIA in the last 168 hours. CBC: Recent Labs  Lab 02/19/20 0615 02/20/20 1131  WBC 9.4 19.7*  NEUTROABS  --  17.6*  HGB 14.9 13.4  HCT 43.1 38.8  MCV 91.5 92.2  PLT 185 116*   Cardiac Enzymes: Recent Labs  Lab 02/20/20 1131  CKTOTAL 82   BNP: Invalid input(s): POCBNP CBG: No results for input(s): GLUCAP in the last 168 hours.     IMAGING RESULTS:  Imaging: DG Abdomen 1 View  Result Date: 02/20/2020 CLINICAL DATA:  Left flank pain EXAM: ABDOMEN - 1 VIEW COMPARISON:  CT 02/19/2020 FINDINGS: The bowel gas pattern is normal. Excreted contrast within the left renal collecting system with mild left hydroureteronephrosis. Previously seen stone near the left UVJ is not clearly seen radiographically. There is a small amount of excreted contrast within the bladder. IMPRESSION: Mild left hydroureteronephrosis. Previously seen stone near the left UVJ is not clearly seen radiographically. Electronically Signed   By: Davina Poke D.O.   On: 02/20/2020 11:10   CT ABDOMEN PELVIS W CONTRAST  Result Date: 02/19/2020 CLINICAL DATA:  LEFT flank pain radiating to LEFT groin with nausea and vomiting since Friday, tea-colored urine with strong smell EXAM: CT ABDOMEN AND PELVIS WITH CONTRAST TECHNIQUE: Multidetector CT imaging of the abdomen and pelvis was performed using the standard protocol following bolus administration of intravenous contrast. Sagittal and coronal MPR images reconstructed from axial data set. CONTRAST:  30mL OMNIPAQUE IOHEXOL 300 MG/ML SOLN IV. No oral contrast. COMPARISON:  07/26/2014 FINDINGS: Lower chest: Lung bases clear Hepatobiliary: Gallbladder surgically absent. Liver normal appearance Pancreas: Normal appearance Spleen: Normal appearance Adrenals/Urinary Tract: Adrenal glands  normal appearance. Enhancing mass at superior pole RIGHT kidney measuring 4.2 x 3.5 x 3.8 cm, imaging features most consistent with a renal  neoplasm. LEFT hydronephrosis and hydroureter with delay in LEFT nephrogram due to a 5 mm distal LEFT ureteral calculus very near the ureterovesical junction. No additional renal masses. RIGHT ureter decompressed. Bladder unremarkable. Stomach/Bowel: Scattered colonic diverticulosis without evidence of diverticulitis. Appendix surgically absent by history. Stomach decompressed. Bowel loops otherwise unremarkable. Vascular/Lymphatic: Atherosclerotic calcifications aorta, iliac arteries, coronary arteries. Aorta normal caliber. No adenopathy. Scattered pelvic phleboliths. Renal veins and IVC patent. Reproductive: Uterus surgically absent.  No definite adnexal masses. Other: Mesh from prior ventral hernia repair. No free air or free fluid. No definite recurrent hernia. Musculoskeletal: Osseous demineralization. IMPRESSION: LEFT hydronephrosis and hydroureter due to a 5 mm distal LEFT ureteral calculus very near the ureterovesical junction. Enhancing 4.2 x 3.5 x 3.8 cm RIGHT upper pole renal mass most consistent with a primary renal neoplasm. Colonic diverticulosis without evidence of diverticulitis. Prior mesh ventral hernia repair without obvious recurrence. Aortic Atherosclerosis (ICD10-I70.0). Findings called to Dr. Tamala Julian on 02/19/2020 at 1408 hours. Electronically Signed   By: Lavonia Dana M.D.   On: 02/19/2020 14:09   DG Chest Port 1 View  Result Date: 02/20/2020 CLINICAL DATA:  Weakness, hypotension EXAM: PORTABLE CHEST 1 VIEW COMPARISON:  08/16/2018 FINDINGS: Post CABG changes. The heart size and mediastinal contours are within normal limits. Atherosclerotic calcification of the aortic knob. No focal airspace consolidation, pleural effusion, or pneumothorax. The visualized skeletal structures are unremarkable. IMPRESSION: No active disease. Electronically Signed   By:  Davina Poke D.O.   On: 02/20/2020 11:07   DG OR UROLOGY CYSTO IMAGE ([facility] ONLY)  Result Date: 02/20/2020 There is no interpretation for this exam.  This order is for images obtained during a surgical procedure.  Please See "Surgeries" Tab for more information regarding the procedure.   @PROBHOSP @ DG Abdomen 1 View  Result Date: 02/20/2020 CLINICAL DATA:  Left flank pain EXAM: ABDOMEN - 1 VIEW COMPARISON:  CT 02/19/2020 FINDINGS: The bowel gas pattern is normal. Excreted contrast within the left renal collecting system with mild left hydroureteronephrosis. Previously seen stone near the left UVJ is not clearly seen radiographically. There is a small amount of excreted contrast within the bladder. IMPRESSION: Mild left hydroureteronephrosis. Previously seen stone near the left UVJ is not clearly seen radiographically. Electronically Signed   By: Davina Poke D.O.   On: 02/20/2020 11:10   DG Chest Port 1 View  Result Date: 02/20/2020 CLINICAL DATA:  Weakness, hypotension EXAM: PORTABLE CHEST 1 VIEW COMPARISON:  08/16/2018 FINDINGS: Post CABG changes. The heart size and mediastinal contours are within normal limits. Atherosclerotic calcification of the aortic knob. No focal airspace consolidation, pleural effusion, or pneumothorax. The visualized skeletal structures are unremarkable. IMPRESSION: No active disease. Electronically Signed   By: Davina Poke D.O.   On: 02/20/2020 11:07   DG OR UROLOGY CYSTO IMAGE ([facility] ONLY)  Result Date: 02/20/2020 There is no interpretation for this exam.  This order is for images obtained during a surgical procedure.  Please See "Surgeries" Tab for more information regarding the procedure.     ASSESSMENT AND PLAN    -Multidisciplinary rounds held today  Septic shock -present on admission  - due to UTI with nephrolithiasis - purulent material coming from ureteral drain per Urologist  use vasopressors to keep MAP>65- on Levophed and will add  vasopressin  -follow ABG and LA- IVF - LR at 150/h x 1 more liter for total of 4L -follow up cultures- urine culture sent by urology  -emperic ABX- on rocephin for now will monitor  procalcitonin trend, lactate trend and WBC count for antimicrobial guidance -midline access requested for to avoid clabsi   GERD with Barrets esophagus  - protonix IV 40 mg   Thrombocytopenia  - likely direct toxicity from sepsis  - closely monitor and d/c DVT prophylaxis upon any sign of bleeding   - monitor for DIC - INR, blood oozing from peripheral veins   ID -continue IV abx as prescibed -follow up cultures  GI/Nutrition GI PROPHYLAXIS as indicated DIET-->TF's as tolerated Constipation protocol as indicated  ENDO - ICU hypoglycemic\Hyperglycemia protocol -check FSBS per protocol   ELECTROLYTES -follow labs as needed -replace as needed -pharmacy consultation   DVT/GI PRX ordered -SCDs  TRANSFUSIONS AS NEEDED MONITOR FSBS ASSESS the need for LABS as needed   Critical care provider statement:    Critical care time (minutes):  109   Critical care time was exclusive of:  Separately billable procedures and treating other patients   Critical care was necessary to treat or prevent imminent or life-threatening deterioration of the following conditions:  Septic shock, UTI, nephrolithiasis, multiple comorbid conditions.    Critical care was time spent personally by me on the following activities:  Development of treatment plan with patient or surrogate, discussions with consultants, evaluation of patient's response to treatment, examination of patient, obtaining history from patient or surrogate, ordering and performing treatments and interventions, ordering and review of laboratory studies and re-evaluation of patient's condition.  I assumed direction of critical care for this patient from another provider in my specialty: no    This document was prepared using Dragon voice recognition software  and may include unintentional dictation errors.    Ottie Glazier, M.D.  Division of [place]

## 2020-02-20 NOTE — Progress Notes (Signed)
CODE SEPSIS - PHARMACY COMMUNICATION  **Broad Spectrum Antibiotics should be administered within 1 hour of Sepsis diagnosis**  Time Code Sepsis Called/Page Received: @ 1055  Antibiotics Ordered: ceftriaxone   Time of 1st antibiotic administration: 1137  Additional action taken by pharmacy: Called RN @ 1130 about time left to start Abx to meet 1 hour for Code Sepsis   If necessary, Name of Provider/Nurse Contacted: Jarrett Soho, Therapist, sports.   Pernell Dupre, PharmD, BCPS Clinical Pharmacist 02/20/2020 10:55 AM

## 2020-02-20 NOTE — Progress Notes (Signed)
Notified bedside nurse of need to draw repeat lactic acid @ 1530.

## 2020-02-20 NOTE — Anesthesia Preprocedure Evaluation (Signed)
Anesthesia Evaluation  Patient identified by MRN, date of birth, ID band Patient awake    Reviewed: Allergy & Precautions, H&P , NPO status , Patient's Chart, lab work & pertinent test results  History of Anesthesia Complications (+) PONV and history of anesthetic complications ("slow to wake up")  Airway Mallampati: III  TM Distance: >3 FB     Dental  (+) Chipped   Pulmonary neg pulmonary ROS, sleep apnea , neg COPD,    breath sounds clear to auscultation       Cardiovascular hypertension, (-) angina+ CAD and + Past MI  (-) Cardiac Stents negative cardio ROS  (-) dysrhythmias (denies known dysrythmias; however noted to be in Afib)  Rhythm:regular Rate:Tachycardia     Neuro/Psych  Headaches, PSYCHIATRIC DISORDERS Anxiety Depression negative neurological ROS  negative psych ROS   GI/Hepatic negative GI ROS, Neg liver ROS, hiatal hernia, GERD  ,  Endo/Other  negative endocrine ROS  Renal/GU      Musculoskeletal   Abdominal   Peds  Hematology negative hematology ROS (+)   Anesthesia Other Findings Code sepsis in ED for infected stone Presenting for emergent cysto stent Hypotensive, tachycardic.  Received IV Abx and 2.5L fluid bolus down in ED.  Reproductive/Obstetrics negative OB ROS                             Anesthesia Physical Anesthesia Plan  ASA: IV and emergent  Anesthesia Plan: General ETT   Post-op Pain Management:    Induction: Rapid sequence  PONV Risk Score and Plan: Ondansetron, Dexamethasone, Midazolam and Treatment may vary due to age or medical condition  Airway Management Planned:   Additional Equipment: Arterial line  Intra-op Plan:   Post-operative Plan:   Informed Consent: I have reviewed the patients History and Physical, chart, labs and discussed the procedure including the risks, benefits and alternatives for the proposed anesthesia with the patient or  authorized representative who has indicated his/her understanding and acceptance.     Dental Advisory Given  Plan Discussed with: Anesthesiologist, CRNA and Surgeon  Anesthesia Plan Comments:         Anesthesia Quick Evaluation

## 2020-02-20 NOTE — Op Note (Signed)
Date of procedure: 02/20/20   Preoperative diagnosis:  1. Left distal ureteral stone 2. Sepsis secondary to urinary source, UTI  Postoperative diagnosis:  1. Same  Procedure: 1. Cystoscopy, left retrograde pyelogram with intraoperative interpretation, left ureteral stent placement  Surgeon: Nickolas Madrid, MD  Anesthesia: General  Complications: None  Intraoperative findings:  1.  Purulent drainage from left kidney, uncomplicated stent placement  EBL: Minimal  Specimens: None  Drains: Left 6 French by 24 cm ureteral stent, 18 French Foley  Indication: Debbie Delacruz is a 74 y.o. patient with 4 mm left distal ureteral stone, urinalysis concerning for infection, elevated lactate, and sepsis from urinary source.  After reviewing the management options for treatment, they elected to proceed with the above surgical procedure(s). We have discussed the potential benefits and risks of the procedure, side effects of the proposed treatment, the likelihood of the patient achieving the goals of the procedure, and any potential problems that might occur during the procedure or recuperation. Informed consent has been obtained.  Description of procedure:  The patient was taken to the operating room and general anesthesia was induced. SCDs were placed for DVT prophylaxis. The patient was placed in the dorsal lithotomy position, prepped and draped in the usual sterile fashion, and preoperative antibiotics(Zosyn in ED) were administered. A preoperative time-out was performed.   29 French rigid cystoscope was used to intubate the urethra and brief cystoscopy was performed.  There were no obvious bladder lesions.  Scout film showed retained contrast in the left kidney from prior CT with contrast and mild hydronephrosis.  A sensor wire was used to intubate the left ureteral orifice and was advanced alongside the stone up into the kidney under fluoroscopic vision.  An access catheter was advanced  over the wire up to the kidney and the wire removed and there was a hydronephrotic drip noted of purulent urine.  The wire was replaced, and a 6 [place] by 24 cm ureteral stent was uneventfully placed.  There was a shepherd's hook in the left midpole, and excellent curl in the bladder.  Purulent urine was seen to drain through the side ports of the stent.  An 12 French Foley was placed to maximize drainage in the postoperative period.  Disposition: Stable to PACU  Plan: Extubated, to ICU for aggressive resuscitation Foley can be removed when clinically improved and afebrile more than 24 hours Will need left ureteroscopy in 3 to 4 weeks for distal stone removal We will also need further evaluation for 4 cm enhancing right upper pole mass in follow-up when acutely recovered from sepsis  Nickolas Madrid, MD

## 2020-02-20 NOTE — Anesthesia Procedure Notes (Signed)
Arterial Line Insertion Start/End9/28/2021 2:32 PM, 02/20/2020 2:32 PM Performed by: Tera Mater, MD, anesthesiologist  Patient location: Pre-op. Preanesthetic checklist: patient identified, IV checked, site marked, risks and benefits discussed, surgical consent, monitors and equipment checked, pre-op evaluation, timeout performed and anesthesia consent Lidocaine 1% used for infiltration Left, radial was placed Catheter size: 20 Fr Hand hygiene performed  and maximum sterile barriers used   Attempts: 1 Procedure performed using ultrasound guided technique. Following insertion, dressing applied and Biopatch. Post procedure assessment: normal and unchanged  Patient tolerated the procedure well with no immediate complications.

## 2020-02-20 NOTE — Progress Notes (Signed)
Pt currently resting comfortably in bed on 2L O2 via nasal canula with O2 sats at 98%.  Remains on levophed gtt @5mcg /min, sbp currently 93-117 and maps >65.  All other vss, spoke with RN regarding current plan of care and all questions were answered.  Will continue to monitor and assess pt.  Marda Stalker, [place] Pager [phone] (please enter 7 digits) PCCM Consult Pager [phone] (please enter 7 digits)

## 2020-02-20 NOTE — Consult Note (Signed)
Urology Consult  I have been asked to see the patient by Dr. Joni Fears, for evaluation and management of urosepsis associated with a ureteral stone.  Chief Complaint: Left flank pain, dizziness, hypotension  History of Present Illness: Debbie Delacruz is a 74 y.o. year old female who was seen in the ED yesterday for management of a 3-day history of left flank pain radiating to the groin.  UA benign at that time, however CT AP with contrast revealed an obstructing 5 mm distal left ureteral stone as well as a 4.2 x 3.5 x 3.8 cm enhancing superior pole right renal mass.  She was discharged on  Presented to the emergency department today reporting the inability to eat or drink since her discharge yesterday with worsened dizziness.  She was found to be hypotensive by EMS upon transfer.  She is started on pressors upon presentation.  Admission labs today notable for lactate 4.8, creatinine 3.25 (baseline 0.9-1.0), and WBC count 19.7.  UA with 21-50 RBCs/hpf, >50 WBCs/hpf, few bacteria, and WBC clumps.  She has been febrile to 38.7 C and hypotensive in the 70s/50s.  She denies a history of nephrolithiasis, but states her brother frequently has kidney stones.  She is aware of her right renal mass.  Last meal was yesterday afternoon, however she states she did drink some water this morning upon awakening.  Past Medical History:  Diagnosis Date  . Anemia   . Anginal pain ([place])   . Anxiety   . Arthritis   . CAD (coronary artery disease)   . Carpal tunnel syndrome    bilateral  . Cataract    bil removed cataracts  . Coronary artery disease    2x stents, Dr. Clayborn Bigness  . Depression   . Dysrhythmia   . Family history of adverse reaction to anesthesia    "brother; S/P lipotripsy in [place]; transferred to [facility]; had to put him on life support for 2 days"  . GERD (gastroesophageal reflux disease)   . Headache    "weekly" (09/13/2014)  . History of blood transfusion 05/2014    "we haven't figured out why I needed it yet" (09/13/2014)  . History of hiatal hernia   . Hyperglycemia   . Hyperlipidemia   . Hypertension   . Myocardial infarct ([place])   . Myocardial infarct, old 1999   15 years ago  . Obesity   . PONV (postoperative nausea and vomiting)   . S/P CABG x 4 09/18/2014   LIMA to LAD, SVG to PDA-dRCA sequentially, SVG to OM, EVH via right thigh   . Sleep apnea   . Sleep apnea    "don't use a mask" (09/13/2014)  . Unstable angina [facility])     Past Surgical History:  Procedure Laterality Date  . ABDOMINAL HYSTERECTOMY    . APPENDECTOMY    . BACK SURGERY    . BLADDER SURGERY    . CARDIAC CATHETERIZATION  06/2014  . CARDIAC CATHETERIZATION N/A 06/16/2016   Procedure: Left Heart Cath and Cors/Grafts Angiography;  Surgeon: Burnell Blanks, MD;  Location: [place] [facility];  Service: Cardiovascular;  Laterality: N/A;  . CARPAL TUNNEL RELEASE Left   . CATARACT EXTRACTION W/ INTRAOCULAR LENS  IMPLANT, BILATERAL    . CHOLECYSTECTOMY    . COLONOSCOPY    . CORONARY ANGIOPLASTY WITH STENT PLACEMENT  1999   [facility] x2 stent  . CORONARY ARTERY BYPASS GRAFT N/A 09/18/2014   Procedure: CORONARY ARTERY BYPASS GRAFTING (CABG)times four  using LIMA  to LAD:SVG to  PD and DIST RCA;SVG to OM;, EVH Right thigh;  Surgeon: [name], MD;  Location: [place];  Service: Open Heart Surgery;  Laterality: N/A;  . ESOPHAGOGASTRODUODENOSCOPY N/A 09/17/2014   Procedure: ESOPHAGOGASTRODUODENOSCOPY (EGD);  Surgeon: Inda Castle, MD;  Location: [place];  Service: Endoscopy;  Laterality: N/A;  . heart stents     1999  . INCONTINENCE SURGERY    . INGUINAL HERNIA REPAIR    . INGUINAL HERNIA REPAIR Right X 3   "last one was in the 1990's; still have hernia there now" (09/13/2014)  . LAPAROSCOPIC CHOLECYSTECTOMY    . LUMBAR DISC SURGERY  1990's?  . LUMBAR SPINE SURGERY    . removal of ovaries    . TEE WITHOUT CARDIOVERSION N/A 09/18/2014   Procedure: TRANSESOPHAGEAL  ECHOCARDIOGRAM (TEE);  Surgeon: [name], MD;  Location: [place];  Service: Open Heart Surgery;  Laterality: N/A;  . UPPER GASTROINTESTINAL ENDOSCOPY      Home Medications:  No outpatient medications have been marked as taking for the 02/20/20 encounter [facility] Encounter).    Allergies:  Allergies  Allergen Reactions  . Ferrous Sulfate [Ferrous Fumarate] Other (See Comments)    STOMACH ISSUES  . Lipitor [Atorvastatin] Other (See Comments)    myalgia    Family History  Problem Relation Age of Onset  . Coronary artery disease Mother   . Osteoporosis Mother   . Heart disease Mother   . Stroke Mother   . Heart attack Mother   . Hypertension Mother   . Hypertension Father   . Coronary artery disease Father   . COPD Father   . Heart disease Father   . Heart attack Father   . Glaucoma Father   . Osteoporosis Father   . Emphysema Father   . Cancer Sister 94       colon  . Hypertension Sister   . Colon cancer Sister        dx in her 48's  . Breast cancer Sister   . Colon polyps Sister   . Stroke Brother   . Hypertension Brother   . Colon polyps Brother   . Stroke Maternal Grandfather   . Stroke Maternal Grandmother   . Osteoporosis Maternal Grandmother   . Hypertension Maternal Grandmother   . Hypertension Paternal Grandmother   . Hypertension Paternal Grandfather   . Breast cancer Maternal Aunt        2 aunts-50's  . Esophageal cancer Neg Hx   . Stomach cancer Neg Hx   . Rectal cancer Neg Hx   . Pancreatic cancer Neg Hx     Social History:  reports that she has never smoked. She has never used smokeless tobacco. She reports previous alcohol use. She reports that she does not use drugs.  ROS: A complete review of systems was performed.  All systems are negative except for pertinent findings as noted.  Physical Exam:  Vital signs in last 24 hours: Pulse Rate:  [77-175] 175 (09/28 1330) Resp:  [16-42] 37 (09/28 1330) BP: (77-145)/(45-70) 99/62 (09/28  1330) SpO2:  [93 %-100 %] 98 % (09/28 1330) Weight:  [79.8 kg] 79.8 kg (09/28 1059) Constitutional:  Alert and oriented, ill-appearing HEENT: [address] AT, moist mucus membranes Cardiovascular: No clubbing, cyanosis, or edema. Respiratory: Normal respiratory effort Skin: No rashes, bruises or suspicious lesions Neurologic: Grossly intact, no focal deficits, moving all 4 extremities Psychiatric: Normal mood and affect  Laboratory Data:  Recent Labs  02/19/20 0615 02/20/20 1131  WBC 9.4 19.7*  HGB 14.9 13.4  HCT 43.1 38.8   Recent Labs    02/19/20 0615 02/20/20 1131  NA 137 135  K 3.6 3.2*  CL 102 101  CO2 25 20*  GLUCOSE 138* 90  BUN 29* 46*  CREATININE 1.33* 3.25*  CALCIUM 8.3* 8.0*   Recent Labs    02/20/20 1131  INR 1.5*   Urinalysis    Component Value Date/Time   COLORURINE AMBER (A) 02/20/2020 1131   APPEARANCEUR CLOUDY (A) 02/20/2020 1131   APPEARANCEUR Clear 06/05/2014 0803   LABSPEC 1.039 (H) 02/20/2020 1131   LABSPEC 1.012 06/05/2014 0803   PHURINE 5.0 02/20/2020 1131   GLUCOSEU NEGATIVE 02/20/2020 1131   GLUCOSEU Negative 06/05/2014 0803   HGBUR LARGE (A) 02/20/2020 1131   HGBUR negative 01/17/2007 1504   BILIRUBINUR SMALL (A) 02/20/2020 1131   BILIRUBINUR Negative 06/05/2014 0803   KETONESUR NEGATIVE 02/20/2020 1131   PROTEINUR 100 (A) 02/20/2020 1131   UROBILINOGEN 0.2 03/07/2015 2045   NITRITE NEGATIVE 02/20/2020 1131   LEUKOCYTESUR MODERATE (A) 02/20/2020 1131   LEUKOCYTESUR Negative 06/05/2014 0803   Results for orders placed or performed during the hospital encounter of 02/19/20  Respiratory Panel by RT PCR (Flu A&B, Covid) - Nasopharyngeal Swab     Status: None   Collection Time: 02/19/20 12:47 PM   Specimen: Nasopharyngeal Swab  Result Value Ref Range Status   SARS Coronavirus 2 by RT PCR NEGATIVE NEGATIVE Final    Comment: (NOTE) SARS-CoV-2 target nucleic acids are NOT DETECTED.  The SARS-CoV-2 RNA is generally detectable in upper  respiratoy specimens during the acute phase of infection. The lowest concentration of SARS-CoV-2 viral copies this assay can detect is 131 copies/mL. A negative result does not preclude SARS-Cov-2 infection and should not be used as the sole basis for treatment or other patient management decisions. A negative result may occur with  improper specimen collection/handling, submission of specimen other than nasopharyngeal swab, presence of viral mutation(s) within the areas targeted by this assay, and inadequate number of viral copies (<131 copies/mL). A negative result must be combined with clinical observations, patient history, and epidemiological information. The expected result is Negative.  Fact Sheet for Patients:  PinkCheek.be  Fact Sheet for Healthcare Providers:  GravelBags.it  This test is no t yet approved or cleared by the [place] FDA and  has been authorized for detection and/or diagnosis of SARS-CoV-2 by FDA under an Emergency Use Authorization (EUA). This EUA will remain  in effect (meaning this test can be used) for the duration of the COVID-19 declaration under Section 564(b)(1) of the Act, 21 U.S.C. section 360bbb-3(b)(1), unless the authorization is terminated or revoked sooner.     Influenza A by PCR NEGATIVE NEGATIVE Final   Influenza B by PCR NEGATIVE NEGATIVE Final    Comment: (NOTE) The Xpert Xpress SARS-CoV-2/FLU/RSV assay is intended as an aid in  the diagnosis of influenza from Nasopharyngeal swab specimens and  should not be used as a sole basis for treatment. Nasal washings and  aspirates are unacceptable for Xpert Xpress SARS-CoV-2/FLU/RSV  testing.  Fact Sheet for Patients: PinkCheek.be  Fact Sheet for Healthcare Providers: GravelBags.it  This test is not yet approved or cleared by the [place] FDA and  has been  authorized for detection and/or diagnosis of SARS-CoV-2 by  FDA under an Emergency Use Authorization (EUA). This EUA will remain  in effect (meaning this test can be used) for the duration  of the  Covid-19 declaration under Section 564(b)(1) of the Act, 21  U.S.C. section 360bbb-3(b)(1), unless the authorization is  terminated or revoked. Performed at [facility], [address] [place], [place] [address]     Radiologic Imaging: CT ABDOMEN PELVIS W CONTRAST  Result Date: 02/19/2020 CLINICAL DATA:  LEFT flank pain radiating to LEFT groin with nausea and vomiting since Friday, tea-colored urine with strong smell EXAM: CT ABDOMEN AND PELVIS WITH CONTRAST TECHNIQUE: Multidetector CT imaging of the abdomen and pelvis was performed using the standard protocol following bolus administration of intravenous contrast. Sagittal and coronal MPR images reconstructed from axial data set. CONTRAST:  94mL OMNIPAQUE IOHEXOL 300 MG/ML SOLN IV. No oral contrast. COMPARISON:  07/26/2014 FINDINGS: Lower chest: Lung bases clear Hepatobiliary: Gallbladder surgically absent. Liver normal appearance Pancreas: Normal appearance Spleen: Normal appearance Adrenals/Urinary Tract: Adrenal glands normal appearance. Enhancing mass at superior pole RIGHT kidney measuring 4.2 x 3.5 x 3.8 cm, imaging features most consistent with a renal neoplasm. LEFT hydronephrosis and hydroureter with delay in LEFT nephrogram due to a 5 mm distal LEFT ureteral calculus very near the ureterovesical junction. No additional renal masses. RIGHT ureter decompressed. Bladder unremarkable. Stomach/Bowel: Scattered colonic diverticulosis without evidence of diverticulitis. Appendix surgically absent by history. Stomach decompressed. Bowel loops otherwise unremarkable. Vascular/Lymphatic: Atherosclerotic calcifications aorta, iliac arteries, coronary arteries. Aorta normal caliber. No adenopathy. Scattered pelvic phleboliths. Renal veins and IVC  patent. Reproductive: Uterus surgically absent.  No definite adnexal masses. Other: Mesh from prior ventral hernia repair. No free air or free fluid. No definite recurrent hernia. Musculoskeletal: Osseous demineralization. IMPRESSION: LEFT hydronephrosis and hydroureter due to a 5 mm distal LEFT ureteral calculus very near the ureterovesical junction. Enhancing 4.2 x 3.5 x 3.8 cm RIGHT upper pole renal mass most consistent with a primary renal neoplasm. Colonic diverticulosis without evidence of diverticulitis. Prior mesh ventral hernia repair without obvious recurrence. Aortic Atherosclerosis (ICD10-I70.0). Findings called to Dr. Tamala Julian on 02/19/2020 at 1408 hours. Electronically Signed   By: Lavonia Dana M.D.   On: 02/19/2020 14:09   Assessment & Plan:  74 year old female with a recent diagnosis of an obstructing 5 mm distal left ureteral stone and incidental right renal mass who re-presented to the ED today with urosepsis secondary to her stone requiring pressor support.  I explained to the patient that her urinary infection is life-threatening and requires urgent intervention in the form of a left ureteral stent placement.  I explained that the purpose of the stent is to help relieve urinary obstruction and gain source control of her urinary infection.  I explained that the purpose of this treatment was not to manipulate the stone and that we would treat the stone at a later date once she completes culture appropriate therapy for her infection.  We discussed the risks of the procedure including infection, bleeding, and damage to surrounding structures.  I explained that electing not to proceed with stent placement today would likely lead to worsening sepsis, which would be life-threatening.  Patient agrees to proceed with left ureteral stent today.  Left side marked.  We will plan for urgent left ureteral stent placement with Dr. Diamantina Providence this afternoon.  She will require ICU support postoperatively for  management of pressors.  We will plan to revisit patient tomorrow and review treatment plan when she is feeling better.  Thank you for involving me in this patient's care, I will continue to follow along.  Debroah Loop, PA-C 02/20/2020 1:51 PM

## 2020-02-20 NOTE — Progress Notes (Signed)
Notified bedside nurse of need to draw repeat lactic acid. 

## 2020-02-20 NOTE — ED Notes (Signed)
1st set blood cx's collected at 1130, 2nd set collected 1135

## 2020-02-20 NOTE — Progress Notes (Signed)
Spoke with Dr.Aleskerov in regards to midline placement and the CRCL being 17.7 he stated that this is not a chronic kidney patient but an acute issue. Therefore would like for the midline to be placed. Per Dr.Aleskerov the midline will be used for fluids and antibiotics only.

## 2020-02-20 NOTE — Progress Notes (Signed)
Updated pt's spouse per pt request.  Pt able to speak with spouse on phone.

## 2020-02-20 NOTE — ED Provider Notes (Addendum)
[facility] Emergency Department Provider Note  ____________________________________________  Time seen: Approximately 10:52 AM  I have reviewed the triage vital signs and the nursing notes.   HISTORY  Chief Complaint Left flank pain   HPI Debbie Delacruz is a 74 y.o. female with a history of CAD, hypertension, Barrett's esophagus who comes the ED complaining of left flank pain.  She was seen in the ED yesterday for the same, diagnosed with a 5 mm left ureteral stone causing some hydronephrosis but with otherwise reassuring labs.  Patient was given medication and hydration and felt much better was discharged home yesterday.  However she reports that she is still been unable to eat or drink at home.  She has had worsening dizziness throughout the night, called EMS this morning who noted an initial blood pressure of about 70/50.  They gave 300 mL of IV fluid bolus prior to arrival in the ED.  They also note heart rate of 102 and temperature of 100.2.  In addition to the left flank pain which is radiating to back, severe, waxing and waning, no aggravating alleviating factors, she also notes an associated watery diarrhea for the past few days that is not black or bloody.  Denies nausea or vomiting.  Pain level is currently 0/10 due to medications given by EMS.  Past Medical History:  Diagnosis Date  . Anemia   . Anginal pain ([place])   . Anxiety   . Arthritis   . CAD (coronary artery disease)   . Carpal tunnel syndrome    bilateral  . Cataract    bil removed cataracts  . Coronary artery disease    2x stents, Dr. Clayborn Bigness  . Depression   . Dysrhythmia   . Family history of adverse reaction to anesthesia    "brother; S/P lipotripsy in [place]; transferred to [facility]; had to put him on life support for 2 days"  . GERD (gastroesophageal reflux disease)   . Headache    "weekly" (09/13/2014)  . History of blood transfusion 05/2014   "we haven't  figured out why I needed it yet" (09/13/2014)  . History of hiatal hernia   . Hyperglycemia   . Hyperlipidemia   . Hypertension   . Myocardial infarct ([place])   . Myocardial infarct, old 1999   15 years ago  . Obesity   . PONV (postoperative nausea and vomiting)   . S/P CABG x 4 09/18/2014   LIMA to LAD, SVG to PDA-dRCA sequentially, SVG to OM, EVH via right thigh   . Sleep apnea   . Sleep apnea    "don't use a mask" (09/13/2014)  . Unstable angina [facility])      Patient Active Problem List   Diagnosis Date Noted  . Obesity (BMI 30.0-34.9) 08/30/2019  . Hyperlipidemia 07/17/2019  . Anxiety and depression 10/09/2015  . S/P CABG x 4 09/18/2014  . Barrett's esophagus 09/17/2014  . GERD (gastroesophageal reflux disease)   . CAD (coronary artery disease)   . Hypertension 11/16/2012  . Iron deficiency anemia 11/16/2012  . Arthralgia 11/16/2012  . Screening for breast cancer 11/16/2012  . Special screening for malignant neoplasms, colon 11/16/2012  . Anemia 09/14/2011  . Headache(784.0) 07/30/2011  . Dyslipidemia 09/15/2008  . COLONIC POLYPS, ADENOMATOUS, HX OF 02/20/2008  . Anxiety 11/04/2006     Past Surgical History:  Procedure Laterality Date  . ABDOMINAL HYSTERECTOMY    . APPENDECTOMY    . BACK SURGERY    . BLADDER  SURGERY    . CARDIAC CATHETERIZATION  06/2014  . CARDIAC CATHETERIZATION N/A 06/16/2016   Procedure: Left Heart Cath and Cors/Grafts Angiography;  Surgeon: Burnell Blanks, MD;  Location: [place] [facility];  Service: Cardiovascular;  Laterality: N/A;  . CARPAL TUNNEL RELEASE Left   . CATARACT EXTRACTION W/ INTRAOCULAR LENS  IMPLANT, BILATERAL    . CHOLECYSTECTOMY    . COLONOSCOPY    . CORONARY ANGIOPLASTY WITH STENT PLACEMENT  1999   [facility] x2 stent  . CORONARY ARTERY BYPASS GRAFT N/A 09/18/2014   Procedure: CORONARY ARTERY BYPASS GRAFTING (CABG)times four using LIMA  to LAD:SVG to  PD and DIST RCA;SVG to OM;, EVH Right thigh;  Surgeon: [name], MD;   Location: [place];  Service: Open Heart Surgery;  Laterality: N/A;  . ESOPHAGOGASTRODUODENOSCOPY N/A 09/17/2014   Procedure: ESOPHAGOGASTRODUODENOSCOPY (EGD);  Surgeon: Inda Castle, MD;  Location: [place];  Service: Endoscopy;  Laterality: N/A;  . heart stents     1999  . INCONTINENCE SURGERY    . INGUINAL HERNIA REPAIR    . INGUINAL HERNIA REPAIR Right X 3   "last one was in the 1990's; still have hernia there now" (09/13/2014)  . LAPAROSCOPIC CHOLECYSTECTOMY    . LUMBAR DISC SURGERY  1990's?  . LUMBAR SPINE SURGERY    . removal of ovaries    . TEE WITHOUT CARDIOVERSION N/A 09/18/2014   Procedure: TRANSESOPHAGEAL ECHOCARDIOGRAM (TEE);  Surgeon: [name], MD;  Location: [place];  Service: Open Heart Surgery;  Laterality: N/A;  . UPPER GASTROINTESTINAL ENDOSCOPY       Prior to Admission medications   Medication Sig Start Date End Date Taking? Authorizing Provider  amLODipine (NORVASC) 5 MG tablet Take 1 tablet (5 mg total) by mouth daily. 07/26/19  Yes [name], MD  aspirin EC 81 MG tablet Take 1 tablet (81 mg total) by mouth daily. 07/09/16  Yes Deboraha Sprang, MD  isosorbide mononitrate (IMDUR) 30 MG 24 hr tablet Take 1 tablet (30 mg total) by mouth daily. 10/10/19  Yes [name], MD  lisinopril (ZESTRIL) 10 MG tablet TAKE 1 TABLET BY MOUTH ONCE DAILY. PLEASE CALL TO SCHEDULE APPOINTMENT FOR FUTURE REFILLS 02/05/20  Yes [name], MD  metoprolol succinate (TOPROL-XL) 50 MG 24 hr tablet TAKE 1 TABLET BY MOUTH ONCE DAILY 12/01/19  Yes [name], MD  rosuvastatin (CRESTOR) 40 MG tablet TAKE 1 TABLET BY MOUTH ONCE DAILY 02/02/20  Yes [name], MD  tamsulosin (FLOMAX) 0.4 MG CAPS capsule Take 1 capsule (0.4 mg total) by mouth daily for 5 days. 02/19/20 02/24/20 Yes Lucrezia Starch, MD  acetaminophen (TYLENOL) 500 MG tablet Take 500 mg by mouth every 6 (six) hours as needed for headache.    [provider]  HYDROcodone-acetaminophen  (NORCO) 5-325 MG tablet Take 1 tablet by mouth every 6 (six) hours as needed for up to 5 days for severe pain. 02/19/20 02/24/20  Lucrezia Starch, MD  hydrOXYzine (ATARAX/VISTARIL) 10 MG tablet Take 1 tablet (10 mg total) by mouth 3 (three) times daily as needed for anxiety. Patient not taking: Reported on 02/20/2020 07/24/19   [name], MD  nitroGLYCERIN (NITROSTAT) 0.4 MG SL tablet Place 1 tablet (0.4 mg total) under the tongue every 5 (five) minutes as needed for chest pain. Take for up to 3 doses and then call 911 if still having chest pain. 10/11/19   [name], MD  ondansetron [facility])  4 MG tablet Take 1 tablet (4 mg total) by mouth every 8 (eight) hours as needed for up to 10 doses for nausea or vomiting. 02/19/20   Lucrezia Starch, MD     Allergies Ferrous sulfate [ferrous fumarate] and Lipitor [atorvastatin]   Family History  Problem Relation Age of Onset  . Coronary artery disease Mother   . Osteoporosis Mother   . Heart disease Mother   . Stroke Mother   . Heart attack Mother   . Hypertension Mother   . Hypertension Father   . Coronary artery disease Father   . COPD Father   . Heart disease Father   . Heart attack Father   . Glaucoma Father   . Osteoporosis Father   . Emphysema Father   . Cancer Sister 54       colon  . Hypertension Sister   . Colon cancer Sister        dx in her 68's  . Breast cancer Sister   . Colon polyps Sister   . Stroke Brother   . Hypertension Brother   . Colon polyps Brother   . Stroke Maternal Grandfather   . Stroke Maternal Grandmother   . Osteoporosis Maternal Grandmother   . Hypertension Maternal Grandmother   . Hypertension Paternal Grandmother   . Hypertension Paternal Grandfather   . Breast cancer Maternal Aunt        2 aunts-50's  . Esophageal cancer Neg Hx   . Stomach cancer Neg Hx   . Rectal cancer Neg Hx   . Pancreatic cancer Neg Hx     Social History Social History   Tobacco Use  . Smoking status:  Never Smoker  . Smokeless tobacco: Never Used  Vaping Use  . Vaping Use: Never used  Substance Use Topics  . Alcohol use: Not Currently    Alcohol/week: 0.0 standard drinks  . Drug use: No    Review of Systems  Constitutional:   No fever or chills.  ENT:   No sore throat. No rhinorrhea. Cardiovascular:   No chest pain or syncope. Respiratory:   No dyspnea or cough. Gastrointestinal: Left leg pain as above and diarrhea Musculoskeletal:   Negative for focal pain or swelling All other systems reviewed and are negative except as documented above in ROS and HPI.  ____________________________________________   PHYSICAL EXAM:  VITAL SIGNS: ED Triage Vitals  Enc Vitals Group     BP      Pulse      Resp      Temp      Temp src      SpO2      Weight      Height      Head Circumference      Peak Flow      Pain Score      Pain Loc      Pain Edu?      Excl. in [place]?     Vital signs reviewed, nursing assessments reviewed.   Constitutional:   Alert and oriented.  Ill-appearing. Eyes:   Conjunctivae are normal. EOMI. PERRL. ENT      Head:   Normocephalic and atraumatic.      Nose: Normal      Mouth/Throat: Dry mucous membranes.      Neck:   No meningismus. Full ROM. Hematological/Lymphatic/Immunilogical:   No cervical lymphadenopathy. Cardiovascular:   RRR. Symmetric bilateral radial and DP pulses.  No murmurs. Cap refill less than 2 seconds. Respiratory:  Normal respiratory effort without tachypnea/retractions. Breath sounds are clear and equal bilaterally. No wheezes/rales/rhonchi. Gastrointestinal:   Soft and nontender. Non distended.  No rebound, rigidity, or guarding.  Musculoskeletal:   Normal range of motion in all extremities. No joint effusions.  No lower extremity tenderness.  No edema. Neurologic:   Normal speech and language.  Motor grossly intact. No acute focal neurologic deficits are appreciated.  Skin:    Skin is warm, dry and intact. No rash noted.  No  petechiae, purpura, or bullae.  ____________________________________________    LABS (pertinent positives/negatives) (all labs ordered are listed, but only abnormal results are displayed) Labs Reviewed  LACTIC ACID, PLASMA - Abnormal; Notable for the following components:      Result Value   Lactic Acid, Venous 8.1 (*)    All other components within normal limits  LACTIC ACID, PLASMA - Abnormal; Notable for the following components:   Lactic Acid, Venous 4.8 (*)    All other components within normal limits  COMPREHENSIVE METABOLIC PANEL - Abnormal; Notable for the following components:   Potassium 3.2 (*)    CO2 20 (*)    BUN 46 (*)    Creatinine, Ser 3.25 (*)    Calcium 8.0 (*)    Total Protein 6.0 (*)    Albumin 3.2 (*)    AST 106 (*)    ALT 76 (*)    Total Bilirubin 1.3 (*)    GFR calc non Af Amer 14 (*)    GFR calc Af Amer 16 (*)    All other components within normal limits  CBC WITH DIFFERENTIAL/PLATELET - Abnormal; Notable for the following components:   WBC 19.7 (*)    Platelets 116 (*)    Neutro Abs 17.6 (*)    Abs Immature Granulocytes 0.33 (*)    All other components within normal limits  PROTIME-INR - Abnormal; Notable for the following components:   Prothrombin Time 17.2 (*)    INR 1.5 (*)    All other components within normal limits  URINALYSIS, COMPLETE (UACMP) WITH MICROSCOPIC - Abnormal; Notable for the following components:   Color, Urine AMBER (*)    APPearance CLOUDY (*)    Specific Gravity, Urine 1.039 (*)    Hgb urine dipstick LARGE (*)    Bilirubin Urine SMALL (*)    Protein, ur 100 (*)    Leukocytes,Ua MODERATE (*)    WBC, UA >50 (*)    Bacteria, UA FEW (*)    All other components within normal limits  CULTURE, BLOOD (ROUTINE X 2)  CULTURE, BLOOD (ROUTINE X 2)  URINE CULTURE  GASTROINTESTINAL PANEL BY PCR, STOOL (REPLACES STOOL CULTURE)  C DIFFICILE QUICK SCREEN W PCR REFLEX  APTT  CK    ____________________________________________   EKG  Interpreted by me Atrial fibrillation, rate of 128.  Normal axis and intervals.  Poor R wave progression.  Normal ST segments and T waves.  No ischemic changes.  ____________________________________________    RADIOLOGY  DG Abdomen 1 View  Result Date: 02/20/2020 CLINICAL DATA:  Left flank pain EXAM: ABDOMEN - 1 VIEW COMPARISON:  CT 02/19/2020 FINDINGS: The bowel gas pattern is normal. Excreted contrast within the left renal collecting system with mild left hydroureteronephrosis. Previously seen stone near the left UVJ is not clearly seen radiographically. There is a small amount of excreted contrast within the bladder. IMPRESSION: Mild left hydroureteronephrosis. Previously seen stone near the left UVJ is not clearly seen radiographically. Electronically Signed   By: Hart Carwin  Plundo D.O.   On: 02/20/2020 11:10   DG Chest Port 1 View  Result Date: 02/20/2020 CLINICAL DATA:  Weakness, hypotension EXAM: PORTABLE CHEST 1 VIEW COMPARISON:  08/16/2018 FINDINGS: Post CABG changes. The heart size and mediastinal contours are within normal limits. Atherosclerotic calcification of the aortic knob. No focal airspace consolidation, pleural effusion, or pneumothorax. The visualized skeletal structures are unremarkable. IMPRESSION: No active disease. Electronically Signed   By: Davina Poke D.O.   On: 02/20/2020 11:07   DG OR UROLOGY CYSTO IMAGE ([facility] ONLY)  Result Date: 02/20/2020 There is no interpretation for this exam.  This order is for images obtained during a surgical procedure.  Please See "Surgeries" Tab for more information regarding the procedure.    ____________________________________________   PROCEDURES .Critical Care Performed by: Carrie Mew, MD Authorized by: Carrie Mew, MD   Critical care provider statement:    Critical care time (minutes):  35   Critical care time was exclusive of:  Separately  billable procedures and treating other patients   Critical care was necessary to treat or prevent imminent or life-threatening deterioration of the following conditions:  Sepsis   Critical care was time spent personally by me on the following activities:  Development of treatment plan with patient or surrogate, discussions with consultants, evaluation of patient's response to treatment, examination of patient, obtaining history from patient or surrogate, ordering and performing treatments and interventions, ordering and review of laboratory studies, ordering and review of radiographic studies, pulse oximetry, re-evaluation of patient's condition and review of old charts    ____________________________________________  DIFFERENTIAL DIAGNOSIS   Obstructing stone, cystitis, pyelonephritis, sepsis, dehydration, electrolyte abnormality, AKI  CLINICAL IMPRESSION / ASSESSMENT AND PLAN / ED COURSE  Medications ordered in the ED: Medications  lactated ringers infusion ( Intravenous Continued from Pre-op 02/20/20 1405)  cefTRIAXone (ROCEPHIN) 1 g in sodium chloride 0.9 % 100 mL IVPB ( Intravenous Automatically Held 02/28/20 1100)  piperacillin-tazobactam (ZOSYN) IVPB 3.375 g (3.375 g Intravenous New Bag/Given 02/20/20 1401)  0.9 %  sodium chloride infusion (has no administration in time range)  norepinephrine (LEVOPHED) 4mg  in 245mL premix infusion ( Intravenous MAR Hold 02/20/20 1411)  lactated ringers bolus 1,000 mL (0 mLs Intravenous Stopped 02/20/20 1336)    And  lactated ringers bolus 1,000 mL (0 mLs Intravenous Stopped 02/20/20 1336)    And  lactated ringers bolus 500 mL (0 mLs Intravenous Stopped 02/20/20 1336)    Pertinent labs & imaging results that were available during my care of the patient were reviewed by me and considered in my medical decision making (see chart for details).  Debbie Delacruz was evaluated in Emergency Department on 02/20/2020 for the symptoms described in the history  of present illness. She was evaluated in the context of the global COVID-19 pandemic, which necessitated consideration that the patient might be at risk for infection with the SARS-CoV-2 virus that causes COVID-19. Institutional protocols and algorithms that pertain to the evaluation of patients at risk for COVID-19 are in a state of rapid change based on information released by regulatory bodies including the CDC and federal and state organizations. These policies and algorithms were followed during the patient's care in the ED.     Clinical Course as of Feb 19 1414  Tue Feb 20, 2020  1051 Patient presents with tachycardia, fever, hypotension in the setting of known left ureteral lithiasis of 5 mm causing hydronephrosis.  Recent labs were unremarkable, no sign of infection in the urine but now  with concern for sepsis we will repeat labs, obtain cultures, give Rocephin and IV fluids for volume expansion.  We will plan to discuss with urology.  Patient had Covid test yesterday which was negative.  Has been n.p.o. for the past 5 days.   [PS]  1231 Report from lab lactate >4. Has leukocytosis of almost 20,000, new since yesterday. Labs so far c/w septic shock   [PS]  6256 Pt now in afib with RVR, rate 130-150. Will hold on rate control agents due to possibility of worsening hypotension.    [PS]  3893 Hr 130, bp still 80/50. Has received about 1666ml of 2522ml bolus. Rocephin completed. Cmp shows AKI. Repeat abd exam benign. Will page urology.   [PS]  50 D/w Uro Dr. Diamantina Providence who will have his team eval pt to plan emergent cystoscopy and stent. Recs broadening abx coverage for now - will order zosyn.   [PS]    Clinical Course User Index [PS] Carrie Mew, MD     ----------------------------------------- 1:40 PM on 02/20/2020 -----------------------------------------  ICU Dr. Lanney Gins notified of pt presentation and Urology plan. He will coordinate ICU eval.    ----------------------------------------- 2:04 PM on 02/20/2020 -----------------------------------------  30 mL/kg fluid bolus completed.  Sepsis reassessment has been completed.  Blood pressure has normalized, but lactate has increased from 4.8-8.1.  Would plan to go ahead and start peripheral vasopressors, but OR staff have arrived to take patient to the OR, and definitive management with cystoscopy and ureteral stenting is most imperative.  ____________________________________________   FINAL CLINICAL IMPRESSION(S) / ED DIAGNOSES    Final diagnoses:  Septic shock ([place])  Ureterolithiasis  AKI (acute kidney injury) [facility])     ED Discharge Orders    None      Portions of this note were generated with dragon dictation software. Dictation errors may occur despite best attempts at proofreading.   Carrie Mew, MD 02/20/20 1341    Carrie Mew, MD 02/20/20 1405    Carrie Mew, MD 02/20/20 1415

## 2020-02-20 NOTE — Transfer of Care (Signed)
Immediate Anesthesia Transfer of Care Note  Patient: Debbie Delacruz  Procedure(s) Performed: CYSTOSCOPY WITH STENT PLACEMENT (Left )  Patient Location: PACU  Anesthesia Type:General  Level of Consciousness: awake, oriented and patient cooperative  Airway & Oxygen Therapy: Patient Spontanous Breathing and Patient connected to face mask oxygen  Post-op Assessment: Report given to RN and Post -op Vital signs reviewed and stable  Post vital signs: Reviewed and stable  Last Vitals:  Vitals Value Taken Time  BP 86/50 02/20/20 1506  Temp 38.3 C 02/20/20 1456  Pulse 120 02/20/20 1512  Resp 33 02/20/20 1512  SpO2 99 % 02/20/20 1512  Vitals shown include unvalidated device data.  Last Pain:  Vitals:   02/20/20 1456  PainSc: 0-No pain         Complications: No complications documented.   Continues to require vasopressors for hypotension. Dr. Lubertha Basque present in PACU and instructed PACU personnel to restart levophed gtt. Adele Barthel CRNA remains at bedside.

## 2020-02-20 NOTE — ED Notes (Signed)
Pt went into afib, 130-150's. EKG captured, Dr. Joni Fears notified

## 2020-02-21 ENCOUNTER — Ambulatory Visit: Payer: Medicare Other

## 2020-02-21 ENCOUNTER — Inpatient Hospital Stay: Payer: Medicare Other

## 2020-02-21 ENCOUNTER — Encounter: Payer: Self-pay | Admitting: Urology

## 2020-02-21 ENCOUNTER — Encounter: Payer: Medicare Other | Admitting: Gastroenterology

## 2020-02-21 DIAGNOSIS — A419 Sepsis, unspecified organism: Principal | ICD-10-CM

## 2020-02-21 DIAGNOSIS — R6521 Severe sepsis with septic shock: Secondary | ICD-10-CM | POA: Diagnosis not present

## 2020-02-21 LAB — BASIC METABOLIC PANEL
Anion gap: 10 (ref 5–15)
BUN: 43 mg/dL — ABNORMAL HIGH (ref 8–23)
CO2: 23 mmol/L (ref 22–32)
Calcium: 7.8 mg/dL — ABNORMAL LOW (ref 8.9–10.3)
Chloride: 106 mmol/L (ref 98–111)
Creatinine, Ser: 2.27 mg/dL — ABNORMAL HIGH (ref 0.44–1.00)
GFR calc Af Amer: 24 mL/min — ABNORMAL LOW (ref >60)
GFR calc non Af Amer: 21 mL/min — ABNORMAL LOW (ref >60)
Glucose, Bld: 126 mg/dL — ABNORMAL HIGH (ref 70–99)
Potassium: 4.5 mmol/L (ref 3.5–5.1)
Sodium: 139 mmol/L (ref 135–145)

## 2020-02-21 LAB — CBC
HCT: 36.1 % (ref 36.0–46.0)
Hemoglobin: 12.3 g/dL (ref 12.0–15.0)
MCH: 31.9 pg (ref 26.0–34.0)
MCHC: 34.1 g/dL (ref 30.0–36.0)
MCV: 93.5 fL (ref 80.0–100.0)
Platelets: 90 10*3/uL — ABNORMAL LOW (ref 150–400)
RBC: 3.86 MIL/uL — ABNORMAL LOW (ref 3.87–5.11)
RDW: 14 % (ref 11.5–15.5)
WBC: 21 10*3/uL — ABNORMAL HIGH (ref 4.0–10.5)
nRBC: 0 % (ref 0.0–0.2)

## 2020-02-21 LAB — PROCALCITONIN: Procalcitonin: 57.32 ng/mL

## 2020-02-21 LAB — URINE CULTURE: Culture: NO GROWTH

## 2020-02-21 LAB — MAGNESIUM: Magnesium: 2.9 mg/dL — ABNORMAL HIGH (ref 1.7–2.4)

## 2020-02-21 LAB — PHOSPHORUS: Phosphorus: 5.1 mg/dL — ABNORMAL HIGH (ref 2.5–4.6)

## 2020-02-21 LAB — LACTIC ACID, PLASMA: Lactic Acid, Venous: 2 mmol/L (ref 0.5–1.9)

## 2020-02-21 MED ORDER — MIDODRINE HCL 5 MG PO TABS
5.0000 mg | ORAL_TABLET | Freq: Three times a day (TID) | ORAL | Status: DC
  Administered 2020-02-21 (×2): 5 mg via ORAL
  Filled 2020-02-21 (×6): qty 1

## 2020-02-21 MED ORDER — PIPERACILLIN-TAZOBACTAM 3.375 G IVPB
INTRAVENOUS | Status: AC
  Administered 2020-02-21: 3.375 g via INTRAVENOUS
  Filled 2020-02-21: qty 50

## 2020-02-21 MED ORDER — HEPARIN SODIUM (PORCINE) 5000 UNIT/ML IJ SOLN
INTRAMUSCULAR | Status: AC
  Administered 2020-02-21: 5000 [IU] via SUBCUTANEOUS
  Filled 2020-02-21: qty 1

## 2020-02-21 MED ORDER — PIPERACILLIN-TAZOBACTAM 3.375 G IVPB
INTRAVENOUS | Status: AC
  Filled 2020-02-21: qty 50

## 2020-02-21 MED ORDER — CHLORHEXIDINE GLUCONATE CLOTH 2 % EX PADS
6.0000 | MEDICATED_PAD | Freq: Every day | CUTANEOUS | Status: DC
  Administered 2020-02-22 – 2020-02-24 (×3): 6 via TOPICAL

## 2020-02-21 MED ORDER — PIPERACILLIN-TAZOBACTAM 3.375 G IVPB
3.3750 g | Freq: Three times a day (TID) | INTRAVENOUS | Status: DC
  Administered 2020-02-21 – 2020-02-24 (×9): 3.375 g via INTRAVENOUS
  Filled 2020-02-21 (×8): qty 50

## 2020-02-21 NOTE — Evaluation (Signed)
Physical Therapy Evaluation Patient Details Name: Debbie Delacruz MRN: [id number] DOB: December 14, 1945 Today's Date: 02/21/2020   History of Present Illness  Stanton Kidney "Diane" Offner is a 74 y/o female who was admitted with c/o AMS, confustion, hypotension, and dysuria. Pt required emergent L distal uretal stent placement on 9/28. PMH inlcudes GERD w/ Barrett's esophagus, dyslipidemia, HTN, anxiety, anxiety disorder, chronic anemia, and headaches.  Clinical Impression  Pt received lying in bed and pleasantly agreeable to participate in PT evaluation. Pt performed bed mobility with mod I requiring increased time to perform task. Pt able to perform sit <> stand transfer with supervision/CGA for safety and steadying. Pt denied dizziness with transfer and proceeded to ambulated 420 feet with CGA for steadying and presented with decreased and cautious gait speed. Noted guarded gait pattern and pt observed to be fatigued within the last 50 feet of ambulation as noted with labored breathing. Pt performed therex lying in bed for BLE strengthening. Pt presents with decreased balance, endurance, and decreased strength. Pt would benefit from skilled PT during acute stay and recommend outpatient PT at discharge to optimize return to PLOF and maximize functional mobility.    Follow Up Recommendations Outpatient PT    Equipment Recommendations  None recommended by PT    Recommendations for Other Services       Precautions / Restrictions Precautions Precautions: Fall Restrictions Weight Bearing Restrictions: No      Mobility  Bed Mobility Overal bed mobility: Modified Independent             General bed mobility comments: Noted decreased and cautious speed of movements however required no external physical assistance  Transfers Overall transfer level: Needs assistance Equipment used: None Transfers: Sit to/from Stand Sit to Stand: Supervision;Min guard         General transfer comment:  Supervision/CGA for sit <> stand transfers for safety. Pt slow to rise and reaching for bedrail for stand to sit for steadying.  Ambulation/Gait Ambulation/Gait assistance: Min guard Gait Distance (Feet): 420 Feet Assistive device: None Gait Pattern/deviations: Step-through pattern Gait velocity: slightly decreased   General Gait Details: Pt ambulates with cautious gait speed and demonstrates guarded gait pattern with minimal head turns noted. CGA for slight steadying and safety. Noted slight labored breathing within last 50 feet of ambulation distance,  Stairs            Wheelchair Mobility    Modified Rankin (Stroke Patients Only)       Balance Overall balance assessment: Needs assistance Sitting-balance support: Feet supported;Single extremity supported Sitting balance-Leahy Scale: Good Sitting balance - Comments: no overt LOB while sitting edge of bed with single UE support   Standing balance support: No upper extremity supported Standing balance-Leahy Scale: Good Standing balance comment: Good static balance with SBA for safety however required CGA for dynamic movements for steadying                             Pertinent Vitals/Pain Pain Assessment: No/denies pain    Home Living Family/patient expects to be discharged to:: Private residence Living Arrangements: Spouse/significant other;Children Available Help at Discharge: Family;Available 24 hours/day Type of Home: House Home Access: Stairs to enter Entrance Stairs-Rails: None Entrance Stairs-Number of Steps: 3 Home Layout: One level Home Equipment: Grab bars - tub/shower      Prior Function Level of Independence: Independent         Comments: Pt reports independence with ambulation and transfers, works  full time, and denies fall history in last 6 months.     Hand Dominance        Extremity/Trunk Assessment   Upper Extremity Assessment Upper Extremity Assessment: Overall WFL for  tasks assessed;Generalized weakness (grossly 4 to 4+/5 bilaterally)    Lower Extremity Assessment Lower Extremity Assessment: Overall WFL for tasks assessed (grossly 4 to 4+/5 bilaterally)       Communication   Communication: No difficulties  Cognition Arousal/Alertness: Awake/alert Behavior During Therapy: WFL for tasks assessed/performed Overall Cognitive Status: Within Functional Limits for tasks assessed                                        General Comments      Exercises Other Exercises Other Exercises: pt performed hip ab/add, heel slides, and SLR x 12 reps each bilaterally   Assessment/Plan    PT Assessment Patient needs continued PT services  PT Problem List Decreased strength;Decreased activity tolerance;Decreased balance;Decreased mobility       PT Treatment Interventions DME instruction;Gait training;Stair training;Functional mobility training;Therapeutic activities;Therapeutic exercise;Balance training;Patient/family education    PT Goals (Current goals can be found in the Care Plan section)  Acute Rehab PT Goals Patient Stated Goal: to go home; to feel more steady PT Goal Formulation: With patient Time For Goal Achievement: 03/06/20 Potential to Achieve Goals: Good    Frequency Min 2X/week   Barriers to discharge        Co-evaluation               AM-PAC PT "6 Clicks" Mobility  Outcome Measure Help needed turning from your back to your side while in a flat bed without using bedrails?: None Help needed moving from lying on your back to sitting on the side of a flat bed without using bedrails?: A Little Help needed moving to and from a bed to a chair (including a wheelchair)?: A Little Help needed standing up from a chair using your arms (e.g., wheelchair or bedside chair)?: A Little Help needed to walk in hospital room?: A Little Help needed climbing 3-5 steps with a railing? : A Little 6 Click Score: 19    End of Session  Equipment Utilized During Treatment: Gait belt Activity Tolerance: Patient tolerated treatment well;Patient limited by fatigue Patient left: in bed;with call bell/phone within reach;with bed alarm set Nurse Communication: Mobility status PT Visit Diagnosis: Unsteadiness on feet (R26.81)    Time: 1191-4782 PT Time Calculation (min) (ACUTE ONLY): 26 min   Charges:             Vale Haven, SPT  Vale Haven 02/21/2020, 4:34 PM

## 2020-02-21 NOTE — Progress Notes (Signed)
Pharmacy Antibiotic Note  Debbie Delacruz is a 74 y.o. female admitted on 02/20/2020 with sepsis.  Pharmacy has been consulted for Zosyn dosing.  Plan: Zosyn 3.375g IV q8h (4 hour infusion). Follow creatinine closely for need to adjust dose, patient is borderline for needing reduced dose.  Height: 5\' 2"  (157.5 cm) Weight: 83.2 kg (183 lb 6.8 oz) IBW/kg (Calculated) : 50.1  Temp (24hrs), Avg:98.8 F (37.1 C), Min:97 F (36.1 C), Max:101.7 F (38.7 C)  Recent Labs  Lab 02/19/20 0615 02/20/20 1131 02/20/20 1716 02/20/20 2031 02/21/20 0155 02/21/20 0624  WBC 9.4 19.7*  --   --   --  21.0*  CREATININE 1.33* 3.25*  --   --   --  2.27*  LATICACIDVEN  --  8.1*  4.8* 3.1* 2.6* 2.0*  --     Estimated Creatinine Clearance: 22.4 mL/min (A) (by C-G formula based on SCr of 2.27 mg/dL (H)).    Allergies  Allergen Reactions  . Ferrous Sulfate [Ferrous Fumarate] Other (See Comments)    STOMACH ISSUES  . Lipitor [Atorvastatin] Other (See Comments)    myalgia    Antimicrobials this admission: Ceftriaxone 9/28 >> 9/29 Zosyn 9/29 >>   Dose adjustments this admission:  Microbiology results:  Thank you for allowing pharmacy to be a part of this patient's care.  Paulina Fusi, PharmD, BCPS 02/21/2020 10:37 AM

## 2020-02-21 NOTE — Anesthesia Postprocedure Evaluation (Signed)
Anesthesia Post Note  Patient: Debbie Delacruz  Procedure(s) Performed: CYSTOSCOPY WITH STENT PLACEMENT (Left )  Patient location during evaluation: PACU (ICU overnight hold) Anesthesia Type: General Level of consciousness: awake and alert and oriented Pain management: pain level controlled Vital Signs Assessment: post-procedure vital signs reviewed and stable Respiratory status: spontaneous breathing and respiratory function stable Cardiovascular status: stable Postop Assessment: no apparent nausea or vomiting Anesthetic complications: no   No complications documented.   Last Vitals:  Vitals:   02/21/20 0455 02/21/20 0555  BP: (!) 106/43   Pulse: 78   Resp: (!) 25   Temp:  37.4 C  SpO2: 99%     Last Pain:  Vitals:   02/21/20 0555  TempSrc:   PainSc: 0-No pain                 Caryl Asp

## 2020-02-21 NOTE — OR Nursing (Signed)
Patient came off levophed at 2150, she is maintaining BP with MAP equal to or greater then 65. Magnesium given, Normal Saline 1,000 ml bolus given, urine 1300, patient resting comfortable no complaints of pain, changed bedding patient was diaphoretic but now better, heels off of bed.

## 2020-02-21 NOTE — Progress Notes (Signed)
CRITICAL CARE PROGRESS NOTE    Name: Debbie Delacruz MRN: [id number] DOB: 1946-03-11     LOS: 1   SUBJECTIVE FINDINGS & SIGNIFICANT EVENTS    Patient description:   This is a pleasant 74 yo F who is a never smoker, she has a hx of GERD with Barrets esophagus, dyslipidemia, HTN, anxiety disorder, chronic anemia and headaches who came in due to altered mental status with confusion, hypotension and dysuria. She was recently seen by urology for nephrolithiasis as well as a renal mass measuring appx 4cm. She was seen by urology while in ED and taken to OR for left distal ureteral stent placement. Post operatively she remained profoundly hypotensive with shock requiring vasopressor support despite aggressive IVF. She is admitted to MICU for septic shock.    02/21/20- Patient is stable, she is off of IV vasopressors and is on midodrine po decreased to 5mg  tid from 10mg  tid.  She is lucid and is able to eat without assistance.  Plan to move patient to medical floor today with hospitalist [facility] consult.  PT/OT today.   Lines/tubes : Arterial Line 02/20/20 Left Radial (Active)  Site Assessment Clean;Intact;Dry 02/20/20 1554  Line Status Pulsatile blood flow 02/20/20 1554  Art Line Waveform Appropriate 02/20/20 1554  Art Line Interventions Zeroed and calibrated 02/20/20 1521  Color/Movement/Sensation Capillary refill less than 3 sec 02/20/20 1554  Dressing Type Transparent;Securing device 02/20/20 1554  Dressing Status Clean;Dry;Intact 02/20/20 1554     Urethral Catheter Sninsky MD Straight-tip;Latex 18 Fr. (Active)  Indication for Insertion or Continuance of Catheter Peri-operative use for selective surgical procedure - not to exceed 24 hours post-op 02/20/20 1554  Site Assessment Clean;Intact 02/20/20 1554  Catheter  Maintenance Bag below level of bladder;Catheter secured;Drainage bag/tubing not touching floor;No dependent loops 02/20/20 1554  Collection Container Standard drainage bag 02/20/20 1554  Securement Method Leg strap 02/20/20 1554     Ureteral Drain/Stent Left ureter 6 Fr. (Active)    Microbiology/Sepsis markers: Results for orders placed or performed during the hospital encounter of 02/20/20  Blood Culture (routine x 2)     Status: None (Preliminary result)   Collection Time: 02/20/20 11:31 AM   Specimen: BLOOD  Result Value Ref Range Status   Specimen Description BLOOD LEFT ANTECUBITAL  Final   Special Requests   Final    BOTTLES DRAWN AEROBIC AND ANAEROBIC Blood Culture adequate volume   Culture   Final    NO GROWTH < 24 HOURS Performed at [facility], [address], [place], [place] [address]    Report Status PENDING  Incomplete  Blood Culture (routine x 2)     Status: None (Preliminary result)   Collection Time: 02/20/20 11:33 AM   Specimen: BLOOD  Result Value Ref Range Status   Specimen Description BLOOD BLOOD RIGHT HAND  Final   Special Requests   Final    BOTTLES DRAWN AEROBIC AND ANAEROBIC Blood Culture results may not be optimal due to an inadequate volume of blood received in culture bottles   Culture   Final    NO GROWTH < 24 HOURS Performed at [facility], [address], [place], [place] [address]    Report Status PENDING  Incomplete    Anti-infectives:  Anti-infectives (From admission, onward)   Start     Dose/Rate Route Frequency Ordered Stop   02/20/20 1315  piperacillin-tazobactam (ZOSYN) IVPB 3.375 g        3.375 g 100 mL/hr over 30 Minutes Intravenous  Once 02/20/20 1309 02/20/20 1431   02/20/20 1100  cefTRIAXone (ROCEPHIN) 1 g in sodium chloride 0.9 % 100 mL IVPB        1 g 200 mL/hr over 30 Minutes Intravenous Every 24 hours 02/20/20 1050         Consults: Treatment Team:  Billey Co, MD Ottie Glazier, MD     PAST MEDICAL HISTORY   Past Medical History:  Diagnosis Date  . Anemia   . Anginal pain ([place])   . Anxiety   . Arthritis   . CAD (coronary artery disease)   . Carpal tunnel syndrome    bilateral  . Cataract    bil removed cataracts  . Coronary artery disease    2x stents, Dr. Clayborn Bigness  . Depression   . Dysrhythmia   . Family history of adverse reaction to anesthesia    "brother; S/P lipotripsy in [place]; transferred to [facility]; had to put him on life support for 2 days"  . GERD (gastroesophageal reflux disease)   . Headache    "weekly" (09/13/2014)  . History of blood transfusion 05/2014   "we haven't figured out why I needed it yet" (09/13/2014)  . History of hiatal hernia   . Hyperglycemia   . Hyperlipidemia   . Hypertension   . Myocardial infarct ([place])   . Myocardial infarct, old 1999   15 years ago  . Obesity   . PONV (postoperative nausea and vomiting)   . S/P CABG x 4 09/18/2014   LIMA to LAD, SVG to PDA-dRCA sequentially, SVG to OM, EVH via right thigh   . Sleep apnea   . Sleep apnea    "don't use a mask" (09/13/2014)  . Unstable angina [facility])      SURGICAL HISTORY   Past Surgical History:  Procedure Laterality Date  . ABDOMINAL HYSTERECTOMY    . APPENDECTOMY    . BACK SURGERY    . BLADDER SURGERY    . CARDIAC CATHETERIZATION  06/2014  . CARDIAC CATHETERIZATION N/A 06/16/2016   Procedure: Left Heart Cath and Cors/Grafts Angiography;  Surgeon: Burnell Blanks, MD;  Location: [place] [facility];  Service: Cardiovascular;  Laterality: N/A;  . CARPAL TUNNEL RELEASE Left   . CATARACT EXTRACTION W/ INTRAOCULAR LENS  IMPLANT, BILATERAL    . CHOLECYSTECTOMY    . COLONOSCOPY    . CORONARY ANGIOPLASTY WITH STENT PLACEMENT  1999   [facility] x2 stent  . CORONARY ARTERY BYPASS GRAFT N/A 09/18/2014   Procedure: CORONARY ARTERY BYPASS GRAFTING (CABG)times four using LIMA  to LAD:SVG to  PD and DIST RCA;SVG to OM;, EVH Right thigh;  Surgeon: [name], MD;  Location: [place];  Service: Open Heart Surgery;  Laterality: N/A;  . ESOPHAGOGASTRODUODENOSCOPY N/A 09/17/2014   Procedure: ESOPHAGOGASTRODUODENOSCOPY (EGD);  Surgeon: Inda Castle, MD;  Location: [place];  Service: Endoscopy;  Laterality: N/A;  . heart stents     1999  . INCONTINENCE SURGERY    . INGUINAL HERNIA REPAIR    . INGUINAL HERNIA REPAIR Right X 3   "last one was in the 1990's; still have hernia there now" (09/13/2014)  . LAPAROSCOPIC CHOLECYSTECTOMY    . LUMBAR DISC SURGERY  1990's?  . LUMBAR SPINE SURGERY    . removal of ovaries    . TEE WITHOUT CARDIOVERSION N/A 09/18/2014   Procedure: TRANSESOPHAGEAL ECHOCARDIOGRAM (TEE);  Surgeon: [name], MD;  Location: [place];  Service: Open Heart Surgery;  Laterality: N/A;  .  UPPER GASTROINTESTINAL ENDOSCOPY       FAMILY HISTORY   Family History  Problem Relation Age of Onset  . Coronary artery disease Mother   . Osteoporosis Mother   . Heart disease Mother   . Stroke Mother   . Heart attack Mother   . Hypertension Mother   . Hypertension Father   . Coronary artery disease Father   . COPD Father   . Heart disease Father   . Heart attack Father   . Glaucoma Father   . Osteoporosis Father   . Emphysema Father   . Cancer Sister 13       colon  . Hypertension Sister   . Colon cancer Sister        dx in her 44's  . Breast cancer Sister   . Colon polyps Sister   . Stroke Brother   . Hypertension Brother   . Colon polyps Brother   . Stroke Maternal Grandfather   . Stroke Maternal Grandmother   . Osteoporosis Maternal Grandmother   . Hypertension Maternal Grandmother   . Hypertension Paternal Grandmother   . Hypertension Paternal Grandfather   . Breast cancer Maternal Aunt        2 aunts-50's  . Esophageal cancer Neg Hx   . Stomach cancer Neg Hx   . Rectal cancer Neg Hx   . Pancreatic cancer Neg Hx      SOCIAL HISTORY   Social History   Tobacco Use  . Smoking status: Never Smoker  .  Smokeless tobacco: Never Used  Vaping Use  . Vaping Use: Never used  Substance Use Topics  . Alcohol use: Not Currently    Alcohol/week: 0.0 standard drinks  . Drug use: No     MEDICATIONS   Current Medication:  Current Facility-Administered Medications:  .  0.9 %  sodium chloride infusion, 250 mL, Intravenous, Continuous, Carrie Mew, MD .  cefTRIAXone (ROCEPHIN) 1 g in sodium chloride 0.9 % 100 mL IVPB, 1 g, Intravenous, Q24H, Carrie Mew, MD, Stopped at 02/20/20 1207 .  docusate sodium (COLACE) capsule 100 mg, 100 mg, Oral, BID PRN, Ottie Glazier, MD .  heparin injection 5,000 Units, 5,000 Units, Subcutaneous, Q8H, Ottie Glazier, MD, 5,000 Units at 02/21/20 0618 .  midodrine (PROAMATINE) tablet 10 mg, 10 mg, Oral, TID WC, Denilson Salminen, MD, 10 mg at 02/21/20 0805 .  nystatin (MYCOSTATIN/NYSTOP) topical powder, , Topical, BID, Ottie Glazier, MD, Given at 02/20/20 2141 .  polyethylene glycol (MIRALAX / GLYCOLAX) packet 17 g, 17 g, Oral, Daily PRN, Lanney Gins, Xandria Gallaga, MD .  sodium chloride flush (NS) 0.9 % injection 10-40 mL, 10-40 mL, Intracatheter, Q12H, Ottie Glazier, MD, 10 mL at 02/20/20 2152 .  sodium chloride flush (NS) 0.9 % injection 10-40 mL, 10-40 mL, Intracatheter, PRN, Ottie Glazier, MD    ALLERGIES   Ferrous sulfate [ferrous fumarate] and Lipitor [atorvastatin]    REVIEW OF SYSTEMS     10 point ROS done and is + for malaise but negative for pain, sob, confusion.   PHYSICAL EXAMINATION   Vital Signs: Temp:  [97 F (36.1 C)-101.7 F (38.7 C)] 97.9 F (36.6 C) (09/29 0807) Pulse Rate:  [73-175] 89 (09/29 0807) Resp:  [14-42] 27 (09/29 0807) BP: (75-117)/(43-76) 101/64 (09/29 0807) SpO2:  [93 %-100 %] 96 % (09/29 0807) Arterial Line BP: (79-113)/(28-59) 113/56 (09/28 2315) Weight:  [79.8 kg-83.2 kg] 83.2 kg (09/29 0500)  GENERAL:age appropriate in mild distress related to acutely ill state HEAD: Normocephalic, atraumatic.  EYES:  Pupils equal, round, reactive to light.  No scleral icterus.  MOUTH: Moist mucosal membrane. NECK: Supple. No thyromegaly. No nodules. No JVD.  PULMONARY: mild bibasilar crackles CARDIOVASCULAR: S1 and S2. Regular rate and rhythm. No murmurs, rubs, or gallops.  GASTROINTESTINAL: Soft, nontender, non-distended. No masses. Positive bowel sounds. No hepatosplenomegaly.  MUSCULOSKELETAL: No swelling, clubbing, or edema.  NEUROLOGIC: Mild distress due to acute illness no FND on examination SKIN:intact,warm,dry   PERTINENT DATA     Infusions: . sodium chloride    . cefTRIAXone (ROCEPHIN)  IV Stopped (02/20/20 1207)   Scheduled Medications: . heparin  5,000 Units Subcutaneous Q8H  . midodrine  10 mg Oral TID WC  . nystatin   Topical BID  . sodium chloride flush  10-40 mL Intracatheter Q12H   PRN Medications:  Hemodynamic parameters:   Intake/Output: 09/28 0701 - 09/29 0700 In: 5800 [I.V.:2200; IV Piggyback:3600] Out: 2150 [Urine:2150]  Ventilator  Settings:    LAB RESULTS:  Basic Metabolic Panel: Recent Labs  Lab 02/19/20 0615 02/19/20 0615 02/20/20 1131 02/20/20 1131 02/20/20 2031 02/21/20 0624  NA 137  --  135  --   --  139  K 3.6   < > 3.2*   < > 4.1 4.5  CL 102  --  101  --   --  106  CO2 25  --  20*  --   --  23  GLUCOSE 138*  --  90  --   --  126*  BUN 29*  --  46*  --   --  43*  CREATININE 1.33*  --  3.25*  --   --  2.27*  CALCIUM 8.3*  --  8.0*  --   --  7.8*  MG  --   --   --   --  1.5* 2.9*  PHOS  --   --   --   --   --  5.1*   < > = values in this interval not displayed.   Liver Function Tests: Recent Labs  Lab 02/19/20 0615 02/20/20 1131  AST 17 106*  ALT 13 76*  ALKPHOS 42 80  BILITOT 1.0 1.3*  PROT 6.8 6.0*  ALBUMIN 3.6 3.2*   Recent Labs  Lab 02/19/20 0615  LIPASE 22   No results for input(s): AMMONIA in the last 168 hours. CBC: Recent Labs  Lab 02/19/20 0615 02/20/20 1131 02/21/20 0624  WBC 9.4 19.7* 21.0*  NEUTROABS  --   17.6*  --   HGB 14.9 13.4 12.3  HCT 43.1 38.8 36.1  MCV 91.5 92.2 93.5  PLT 185 116* 90*   Cardiac Enzymes: Recent Labs  Lab 02/20/20 1131  CKTOTAL 82   BNP: Invalid input(s): POCBNP CBG: No results for input(s): GLUCAP in the last 168 hours.     IMAGING RESULTS:  Imaging: DG Abdomen 1 View  Result Date: 02/20/2020 CLINICAL DATA:  Left flank pain EXAM: ABDOMEN - 1 VIEW COMPARISON:  CT 02/19/2020 FINDINGS: The bowel gas pattern is normal. Excreted contrast within the left renal collecting system with mild left hydroureteronephrosis. Previously seen stone near the left UVJ is not clearly seen radiographically. There is a small amount of excreted contrast within the bladder. IMPRESSION: Mild left hydroureteronephrosis. Previously seen stone near the left UVJ is not clearly seen radiographically. Electronically Signed   By: Davina Poke D.O.   On: 02/20/2020 11:10   CT ABDOMEN PELVIS W CONTRAST  Result Date: 02/19/2020 CLINICAL DATA:  LEFT flank pain radiating to  LEFT groin with nausea and vomiting since Friday, tea-colored urine with strong smell EXAM: CT ABDOMEN AND PELVIS WITH CONTRAST TECHNIQUE: Multidetector CT imaging of the abdomen and pelvis was performed using the standard protocol following bolus administration of intravenous contrast. Sagittal and coronal MPR images reconstructed from axial data set. CONTRAST:  15mL OMNIPAQUE IOHEXOL 300 MG/ML SOLN IV. No oral contrast. COMPARISON:  07/26/2014 FINDINGS: Lower chest: Lung bases clear Hepatobiliary: Gallbladder surgically absent. Liver normal appearance Pancreas: Normal appearance Spleen: Normal appearance Adrenals/Urinary Tract: Adrenal glands normal appearance. Enhancing mass at superior pole RIGHT kidney measuring 4.2 x 3.5 x 3.8 cm, imaging features most consistent with a renal neoplasm. LEFT hydronephrosis and hydroureter with delay in LEFT nephrogram due to a 5 mm distal LEFT ureteral calculus very near the ureterovesical  junction. No additional renal masses. RIGHT ureter decompressed. Bladder unremarkable. Stomach/Bowel: Scattered colonic diverticulosis without evidence of diverticulitis. Appendix surgically absent by history. Stomach decompressed. Bowel loops otherwise unremarkable. Vascular/Lymphatic: Atherosclerotic calcifications aorta, iliac arteries, coronary arteries. Aorta normal caliber. No adenopathy. Scattered pelvic phleboliths. Renal veins and IVC patent. Reproductive: Uterus surgically absent.  No definite adnexal masses. Other: Mesh from prior ventral hernia repair. No free air or free fluid. No definite recurrent hernia. Musculoskeletal: Osseous demineralization. IMPRESSION: LEFT hydronephrosis and hydroureter due to a 5 mm distal LEFT ureteral calculus very near the ureterovesical junction. Enhancing 4.2 x 3.5 x 3.8 cm RIGHT upper pole renal mass most consistent with a primary renal neoplasm. Colonic diverticulosis without evidence of diverticulitis. Prior mesh ventral hernia repair without obvious recurrence. Aortic Atherosclerosis (ICD10-I70.0). Findings called to Dr. Tamala Julian on 02/19/2020 at 1408 hours. Electronically Signed   By: Lavonia Dana M.D.   On: 02/19/2020 14:09   DG Chest Port 1 View  Result Date: 02/21/2020 CLINICAL DATA:  Pulmonary edema EXAM: PORTABLE CHEST 1 VIEW COMPARISON:  02/20/2020 FINDINGS: Postoperative changes in the mediastinum. Shallow inspiration. Cardiac enlargement with pulmonary vascular congestion and mild interstitial edema. Similar appearance to previous study. No pleural effusions. No pneumothorax. Calcification of the aorta. IMPRESSION: Cardiac enlargement with pulmonary vascular congestion and interstitial edema similar to previous study. Electronically Signed   By: Lucienne Capers M.D.   On: 02/21/2020 00:25   DG Chest Port 1 View  Result Date: 02/20/2020 CLINICAL DATA:  Abnormal chest x-ray. EXAM: PORTABLE CHEST 1 VIEW COMPARISON:  Radiograph earlier today. FINDINGS:  Lower lung volumes from prior exam. Prior median sternotomy. Borderline cardiomegaly likely accentuated by low lung volumes. Aortic atherosclerosis. Increasing streaky opacity at the right lung base. Bronchovascular crowding versus vascular congestion. No significant pleural effusion. No pneumothorax. IMPRESSION: 1. Increasing streaky opacity at the right lung base, may represent atelectasis, pneumonia or aspiration. 2. Lower lung volumes from prior exam. Bronchovascular crowding versus vascular congestion. Electronically Signed   By: Keith Rake M.D.   On: 02/20/2020 17:51   DG Chest Port 1 View  Result Date: 02/20/2020 CLINICAL DATA:  Weakness, hypotension EXAM: PORTABLE CHEST 1 VIEW COMPARISON:  08/16/2018 FINDINGS: Post CABG changes. The heart size and mediastinal contours are within normal limits. Atherosclerotic calcification of the aortic knob. No focal airspace consolidation, pleural effusion, or pneumothorax. The visualized skeletal structures are unremarkable. IMPRESSION: No active disease. Electronically Signed   By: Davina Poke D.O.   On: 02/20/2020 11:07   DG OR UROLOGY CYSTO IMAGE ([facility] ONLY)  Result Date: 02/20/2020 There is no interpretation for this exam.  This order is for images obtained during a surgical procedure.  Please See "Surgeries" Tab for  more information regarding the procedure.   @PROBHOSP @ DG Abdomen 1 View  Result Date: 02/20/2020 CLINICAL DATA:  Left flank pain EXAM: ABDOMEN - 1 VIEW COMPARISON:  CT 02/19/2020 FINDINGS: The bowel gas pattern is normal. Excreted contrast within the left renal collecting system with mild left hydroureteronephrosis. Previously seen stone near the left UVJ is not clearly seen radiographically. There is a small amount of excreted contrast within the bladder. IMPRESSION: Mild left hydroureteronephrosis. Previously seen stone near the left UVJ is not clearly seen radiographically. Electronically Signed   By: Davina Poke D.O.    On: 02/20/2020 11:10   DG Chest Port 1 View  Result Date: 02/21/2020 CLINICAL DATA:  Pulmonary edema EXAM: PORTABLE CHEST 1 VIEW COMPARISON:  02/20/2020 FINDINGS: Postoperative changes in the mediastinum. Shallow inspiration. Cardiac enlargement with pulmonary vascular congestion and mild interstitial edema. Similar appearance to previous study. No pleural effusions. No pneumothorax. Calcification of the aorta. IMPRESSION: Cardiac enlargement with pulmonary vascular congestion and interstitial edema similar to previous study. Electronically Signed   By: Lucienne Capers M.D.   On: 02/21/2020 00:25   DG Chest Port 1 View  Result Date: 02/20/2020 CLINICAL DATA:  Abnormal chest x-ray. EXAM: PORTABLE CHEST 1 VIEW COMPARISON:  Radiograph earlier today. FINDINGS: Lower lung volumes from prior exam. Prior median sternotomy. Borderline cardiomegaly likely accentuated by low lung volumes. Aortic atherosclerosis. Increasing streaky opacity at the right lung base. Bronchovascular crowding versus vascular congestion. No significant pleural effusion. No pneumothorax. IMPRESSION: 1. Increasing streaky opacity at the right lung base, may represent atelectasis, pneumonia or aspiration. 2. Lower lung volumes from prior exam. Bronchovascular crowding versus vascular congestion. Electronically Signed   By: Keith Rake M.D.   On: 02/20/2020 17:51   DG Chest Port 1 View  Result Date: 02/20/2020 CLINICAL DATA:  Weakness, hypotension EXAM: PORTABLE CHEST 1 VIEW COMPARISON:  08/16/2018 FINDINGS: Post CABG changes. The heart size and mediastinal contours are within normal limits. Atherosclerotic calcification of the aortic knob. No focal airspace consolidation, pleural effusion, or pneumothorax. The visualized skeletal structures are unremarkable. IMPRESSION: No active disease. Electronically Signed   By: Davina Poke D.O.   On: 02/20/2020 11:07   DG OR UROLOGY CYSTO IMAGE ([facility] ONLY)  Result Date: 02/20/2020 There  is no interpretation for this exam.  This order is for images obtained during a surgical procedure.  Please See "Surgeries" Tab for more information regarding the procedure.     ASSESSMENT AND PLAN    -Multidisciplinary rounds held today  Septic shock -present on admission  - due to UTI with nephrolithiasis - purulent material coming from ureteral drain per Urologist  use vasopressors to keep MAP>65- on Levophed and will add vasopressin  -follow ABG and LA- IVF - LR at 150/h x 1 more liter for total of 4L -follow up cultures- urine culture sent by urology  -emperic ABX- on rocephin for now will monitor procalcitonin trend, lactate trend and WBC count for antimicrobial guidance -midline access requested for to avoid clabsi -02/21/20- patient is significantly improved overnight, plan to transition patient out of MICU service to medical floor with continued medical therapy and DC planning.    GERD with Barrets esophagus  - protonix IV 40 mg   Right renal mass   -Urology on case - appreciate input   -continue antibiotics for 10 days  Thrombocytopenia  - likely direct toxicity from sepsis  - closely monitor and d/c DVT prophylaxis upon any sign of bleeding   - monitor for DIC -  INR, blood oozing from peripheral veins   ID -continue IV abx as prescibed -follow up cultures  GI/Nutrition GI PROPHYLAXIS as indicated DIET-->TF's as tolerated Constipation protocol as indicated  ENDO - ICU hypoglycemic\Hyperglycemia protocol -check FSBS per protocol   ELECTROLYTES -follow labs as needed -replace as needed -pharmacy consultation   DVT/GI PRX ordered -SCDs  TRANSFUSIONS AS NEEDED MONITOR FSBS ASSESS the need for LABS as needed   Critical care provider statement:    Critical care time (minutes):  33   Critical care time was exclusive of:  Separately billable procedures and treating other patients   Critical care was necessary to treat or prevent imminent or  life-threatening deterioration of the following conditions:  Septic shock, UTI, nephrolithiasis, multiple comorbid conditions.    Critical care was time spent personally by me on the following activities:  Development of treatment plan with patient or surrogate, discussions with consultants, evaluation of patient's response to treatment, examination of patient, obtaining history from patient or surrogate, ordering and performing treatments and interventions, ordering and review of laboratory studies and re-evaluation of patient's condition.  I assumed direction of critical care for this patient from another provider in my specialty: no    This document was prepared using Dragon voice recognition software and may include unintentional dictation errors.    Ottie Glazier, M.D.  Division of [place]

## 2020-02-21 NOTE — Progress Notes (Signed)
Urology Consult Follow Up  Subjective: POD # 1 emergent left ureteral stent placement due to 4 mm left distal ureteral stone associated with sepsis  She is sitting in bed and eating her breakfast.  She is not having any pain and states she is feeling much better.  She is hoping she continues to recover as she has a 72 year old son that she would like to see graduate.   Afebrile  VSS   Serum creatinine trending down from 3.25 yesterday to 2.27 today -baseline creatinine 0.96  WBC count up to 21.0 from 19.7   Cultures are pending.   Urine is pink with scant clot with Foley in place.  Good UOP.   Anti-infectives: Anti-infectives (From admission, onward)   Start     Dose/Rate Route Frequency Ordered Stop   02/20/20 1315  piperacillin-tazobactam (ZOSYN) IVPB 3.375 g        3.375 g 100 mL/hr over 30 Minutes Intravenous  Once 02/20/20 1309 02/20/20 1431   02/20/20 1100  cefTRIAXone (ROCEPHIN) 1 g in sodium chloride 0.9 % 100 mL IVPB        1 g 200 mL/hr over 30 Minutes Intravenous Every 24 hours 02/20/20 1050        Current Facility-Administered Medications  Medication Dose Route Frequency Provider Last Rate Last Admin  . 0.9 %  sodium chloride infusion  250 mL Intravenous Continuous Carrie Mew, MD      . cefTRIAXone (ROCEPHIN) 1 g in sodium chloride 0.9 % 100 mL IVPB  1 g Intravenous Q24H Carrie Mew, MD   Stopped at 02/20/20 1207  . docusate sodium (COLACE) capsule 100 mg  100 mg Oral BID PRN Ottie Glazier, MD      . heparin injection 5,000 Units  5,000 Units Subcutaneous Q8H Ottie Glazier, MD   5,000 Units at 02/21/20 0618  . midodrine (PROAMATINE) tablet 10 mg  10 mg Oral TID WC Ottie Glazier, MD   10 mg at 02/20/20 1857  . nystatin (MYCOSTATIN/NYSTOP) topical powder   Topical BID Ottie Glazier, MD   Given at 02/20/20 2141  . polyethylene glycol (MIRALAX / GLYCOLAX) packet 17 g  17 g Oral Daily PRN Ottie Glazier, MD      . sodium chloride flush (NS) 0.9 %  injection 10-40 mL  10-40 mL Intracatheter Q12H Ottie Glazier, MD   10 mL at 02/20/20 2152  . sodium chloride flush (NS) 0.9 % injection 10-40 mL  10-40 mL Intracatheter PRN Ottie Glazier, MD         Objective: Vital signs in last 24 hours: Temp:  [97 F (36.1 C)-101.7 F (38.7 C)] 99.3 F (37.4 C) (09/29 0555) Pulse Rate:  [73-175] 78 (09/29 0455) Resp:  [14-42] 25 (09/29 0455) BP: (75-117)/(43-76) 106/43 (09/29 0455) SpO2:  [93 %-100 %] 99 % (09/29 0455) Arterial Line BP: (79-113)/(28-59) 113/56 (09/28 2315) Weight:  [79.8 kg-83.2 kg] 83.2 kg (09/29 0500)  Intake/Output from previous day: 09/28 0701 - 09/29 0700 In: 5800 [I.V.:2200; IV Piggyback:3600] Out: 2150 [Urine:2150] Intake/Output this shift: No intake/output data recorded.   Physical Exam Constitutional:  Well nourished. Alert and oriented, No acute distress. HEENT: [place] AT, moist mucus membranes.  Trachea midline Cardiovascular: No clubbing, cyanosis, or edema. Respiratory: Normal respiratory effort, no increased work of breathing. GU: No CVA tenderness.  No bladder fullness or masses.  Foley in place Neurologic: Grossly intact, no focal deficits, moving all 4 extremities. Psychiatric: Normal mood and affect.  Lab Results:  Recent Labs  02/19/20 0615 02/20/20 1131  WBC 9.4 19.7*  HGB 14.9 13.4  HCT 43.1 38.8  PLT 185 116*   BMET Recent Labs    02/20/20 1131 02/20/20 1131 02/20/20 2031 02/21/20 0624  NA 135  --   --  139  K 3.2*   < > 4.1 4.5  CL 101  --   --  106  CO2 20*  --   --  23  GLUCOSE 90  --   --  126*  BUN 46*  --   --  43*  CREATININE 3.25*  --   --  2.27*  CALCIUM 8.0*  --   --  7.8*   < > = values in this interval not displayed.   PT/INR Recent Labs    02/20/20 1131  LABPROT 17.2*  INR 1.5*   ABG No results for input(s): PHART, HCO3 in the last 72 hours.  Invalid input(s): PCO2, PO2  Studies/Results: DG Abdomen 1 View  Result Date: 02/20/2020 CLINICAL DATA:  Left  flank pain EXAM: ABDOMEN - 1 VIEW COMPARISON:  CT 02/19/2020 FINDINGS: The bowel gas pattern is normal. Excreted contrast within the left renal collecting system with mild left hydroureteronephrosis. Previously seen stone near the left UVJ is not clearly seen radiographically. There is a small amount of excreted contrast within the bladder. IMPRESSION: Mild left hydroureteronephrosis. Previously seen stone near the left UVJ is not clearly seen radiographically. Electronically Signed   By: Davina Poke D.O.   On: 02/20/2020 11:10   CT ABDOMEN PELVIS W CONTRAST  Result Date: 02/19/2020 CLINICAL DATA:  LEFT flank pain radiating to LEFT groin with nausea and vomiting since Friday, tea-colored urine with strong smell EXAM: CT ABDOMEN AND PELVIS WITH CONTRAST TECHNIQUE: Multidetector CT imaging of the abdomen and pelvis was performed using the standard protocol following bolus administration of intravenous contrast. Sagittal and coronal MPR images reconstructed from axial data set. CONTRAST:  34mL OMNIPAQUE IOHEXOL 300 MG/ML SOLN IV. No oral contrast. COMPARISON:  07/26/2014 FINDINGS: Lower chest: Lung bases clear Hepatobiliary: Gallbladder surgically absent. Liver normal appearance Pancreas: Normal appearance Spleen: Normal appearance Adrenals/Urinary Tract: Adrenal glands normal appearance. Enhancing mass at superior pole RIGHT kidney measuring 4.2 x 3.5 x 3.8 cm, imaging features most consistent with a renal neoplasm. LEFT hydronephrosis and hydroureter with delay in LEFT nephrogram due to a 5 mm distal LEFT ureteral calculus very near the ureterovesical junction. No additional renal masses. RIGHT ureter decompressed. Bladder unremarkable. Stomach/Bowel: Scattered colonic diverticulosis without evidence of diverticulitis. Appendix surgically absent by history. Stomach decompressed. Bowel loops otherwise unremarkable. Vascular/Lymphatic: Atherosclerotic calcifications aorta, iliac arteries, coronary arteries.  Aorta normal caliber. No adenopathy. Scattered pelvic phleboliths. Renal veins and IVC patent. Reproductive: Uterus surgically absent.  No definite adnexal masses. Other: Mesh from prior ventral hernia repair. No free air or free fluid. No definite recurrent hernia. Musculoskeletal: Osseous demineralization. IMPRESSION: LEFT hydronephrosis and hydroureter due to a 5 mm distal LEFT ureteral calculus very near the ureterovesical junction. Enhancing 4.2 x 3.5 x 3.8 cm RIGHT upper pole renal mass most consistent with a primary renal neoplasm. Colonic diverticulosis without evidence of diverticulitis. Prior mesh ventral hernia repair without obvious recurrence. Aortic Atherosclerosis (ICD10-I70.0). Findings called to Dr. Tamala Julian on 02/19/2020 at 1408 hours. Electronically Signed   By: Lavonia Dana M.D.   On: 02/19/2020 14:09   DG Chest Port 1 View  Result Date: 02/21/2020 CLINICAL DATA:  Pulmonary edema EXAM: PORTABLE CHEST 1 VIEW COMPARISON:  02/20/2020 FINDINGS: Postoperative changes in  the mediastinum. Shallow inspiration. Cardiac enlargement with pulmonary vascular congestion and mild interstitial edema. Similar appearance to previous study. No pleural effusions. No pneumothorax. Calcification of the aorta. IMPRESSION: Cardiac enlargement with pulmonary vascular congestion and interstitial edema similar to previous study. Electronically Signed   By: Lucienne Capers M.D.   On: 02/21/2020 00:25   DG Chest Port 1 View  Result Date: 02/20/2020 CLINICAL DATA:  Abnormal chest x-ray. EXAM: PORTABLE CHEST 1 VIEW COMPARISON:  Radiograph earlier today. FINDINGS: Lower lung volumes from prior exam. Prior median sternotomy. Borderline cardiomegaly likely accentuated by low lung volumes. Aortic atherosclerosis. Increasing streaky opacity at the right lung base. Bronchovascular crowding versus vascular congestion. No significant pleural effusion. No pneumothorax. IMPRESSION: 1. Increasing streaky opacity at the right lung  base, may represent atelectasis, pneumonia or aspiration. 2. Lower lung volumes from prior exam. Bronchovascular crowding versus vascular congestion. Electronically Signed   By: Keith Rake M.D.   On: 02/20/2020 17:51   DG Chest Port 1 View  Result Date: 02/20/2020 CLINICAL DATA:  Weakness, hypotension EXAM: PORTABLE CHEST 1 VIEW COMPARISON:  08/16/2018 FINDINGS: Post CABG changes. The heart size and mediastinal contours are within normal limits. Atherosclerotic calcification of the aortic knob. No focal airspace consolidation, pleural effusion, or pneumothorax. The visualized skeletal structures are unremarkable. IMPRESSION: No active disease. Electronically Signed   By: Davina Poke D.O.   On: 02/20/2020 11:07   DG OR UROLOGY CYSTO IMAGE ([facility] ONLY)  Result Date: 02/20/2020 There is no interpretation for this exam.  This order is for images obtained during a surgical procedure.  Please See "Surgeries" Tab for more information regarding the procedure.     Assessment and Plan: 74 year old female who presented to the ED and found to be hypotensive and tachycardic secondary to an obstructing 5 mm distal left ureteral stone who underwent emergent left ureteral stent placement yesterday.  There was also an incidental finding of a 4 cm right renal mass concerning for malignancy.  Recommendations: -Foley may be removed if she remains afebrile for more than 24 hours and continues to improve clinically -Continue broad-spectrum antibiotics until cultures and sensitivities have returned -Recommend 10 days of culture appropriate antibiotic on discharge -Continue to trend serum creatinine and CBC -Will need left ureteroscopy in 3 to 4 weeks for distal stone removal -We will also need further evaluation for this 4 cm enhancing right upper pole mass      LOS: 1 day    [facility] 02/21/2020

## 2020-02-22 DIAGNOSIS — N201 Calculus of ureter: Secondary | ICD-10-CM | POA: Diagnosis not present

## 2020-02-22 DIAGNOSIS — N178 Other acute kidney failure: Secondary | ICD-10-CM

## 2020-02-22 DIAGNOSIS — R9389 Abnormal findings on diagnostic imaging of other specified body structures: Secondary | ICD-10-CM | POA: Diagnosis not present

## 2020-02-22 DIAGNOSIS — N179 Acute kidney failure, unspecified: Secondary | ICD-10-CM | POA: Diagnosis not present

## 2020-02-22 DIAGNOSIS — A4151 Sepsis due to Escherichia coli [E. coli]: Secondary | ICD-10-CM

## 2020-02-22 DIAGNOSIS — A419 Sepsis, unspecified organism: Secondary | ICD-10-CM | POA: Diagnosis not present

## 2020-02-22 DIAGNOSIS — R6520 Severe sepsis without septic shock: Secondary | ICD-10-CM

## 2020-02-22 LAB — CBC WITH DIFFERENTIAL/PLATELET
Abs Immature Granulocytes: 3.99 10*3/uL — ABNORMAL HIGH (ref 0.00–0.07)
Basophils Absolute: 0 10*3/uL (ref 0.0–0.1)
Basophils Relative: 0 %
Eosinophils Absolute: 0 10*3/uL (ref 0.0–0.5)
Eosinophils Relative: 0 %
HCT: 34.6 % — ABNORMAL LOW (ref 36.0–46.0)
Hemoglobin: 12.1 g/dL (ref 12.0–15.0)
Immature Granulocytes: 15 %
Lymphocytes Relative: 6 %
Lymphs Abs: 1.7 10*3/uL (ref 0.7–4.0)
MCH: 31.7 pg (ref 26.0–34.0)
MCHC: 35 g/dL (ref 30.0–36.0)
MCV: 90.6 fL (ref 80.0–100.0)
Monocytes Absolute: 0.8 10*3/uL (ref 0.1–1.0)
Monocytes Relative: 3 %
Neutro Abs: 20.3 10*3/uL — ABNORMAL HIGH (ref 1.7–7.7)
Neutrophils Relative %: 76 %
Platelets: 96 10*3/uL — ABNORMAL LOW (ref 150–400)
RBC: 3.82 MIL/uL — ABNORMAL LOW (ref 3.87–5.11)
RDW: 13.8 % (ref 11.5–15.5)
Smear Review: NORMAL
WBC: 26.8 10*3/uL — ABNORMAL HIGH (ref 4.0–10.5)
nRBC: 0 % (ref 0.0–0.2)

## 2020-02-22 LAB — COMPREHENSIVE METABOLIC PANEL
ALT: 37 U/L (ref 0–44)
AST: 33 U/L (ref 15–41)
Albumin: 2.5 g/dL — ABNORMAL LOW (ref 3.5–5.0)
Alkaline Phosphatase: 71 U/L (ref 38–126)
Anion gap: 13 (ref 5–15)
BUN: 47 mg/dL — ABNORMAL HIGH (ref 8–23)
CO2: 22 mmol/L (ref 22–32)
Calcium: 8.1 mg/dL — ABNORMAL LOW (ref 8.9–10.3)
Chloride: 107 mmol/L (ref 98–111)
Creatinine, Ser: 1.51 mg/dL — ABNORMAL HIGH (ref 0.44–1.00)
GFR calc Af Amer: 40 mL/min — ABNORMAL LOW (ref >60)
GFR calc non Af Amer: 34 mL/min — ABNORMAL LOW (ref >60)
Glucose, Bld: 138 mg/dL — ABNORMAL HIGH (ref 70–99)
Potassium: 3.9 mmol/L (ref 3.5–5.1)
Sodium: 142 mmol/L (ref 135–145)
Total Bilirubin: 0.7 mg/dL (ref 0.3–1.2)
Total Protein: 5.5 g/dL — ABNORMAL LOW (ref 6.5–8.1)

## 2020-02-22 LAB — PROCALCITONIN
Procalcitonin: 25.72 ng/mL
Procalcitonin: 19.71 ng/mL

## 2020-02-22 LAB — ACETAMINOPHEN LEVEL: Acetaminophen (Tylenol), Serum: <10 ug/mL — ABNORMAL LOW (ref 10–30)

## 2020-02-22 LAB — URINE CULTURE: Culture: >=100000 — AB

## 2020-02-22 LAB — LACTIC ACID, PLASMA
Lactic Acid, Venous: 2.7 mmol/L (ref 0.5–1.9)
Lactic Acid, Venous: 2 mmol/L (ref 0.5–1.9)

## 2020-02-22 LAB — SALICYLATE LEVEL: Salicylate Lvl: <7 mg/dL — ABNORMAL LOW (ref 7.0–30.0)

## 2020-02-22 MED ORDER — HYDROCODONE-ACETAMINOPHEN 5-325 MG PO TABS: 1.0000 | ORAL_TABLET | Freq: Four times a day (QID) | ORAL | Status: DC | PRN

## 2020-02-22 MED ORDER — METOPROLOL SUCCINATE ER 50 MG PO TB24
50.0000 mg | ORAL_TABLET | Freq: Every day | ORAL | Status: DC
  Administered 2020-02-22 – 2020-02-24 (×3): 50 mg via ORAL
  Filled 2020-02-22 (×3): qty 1

## 2020-02-22 MED ORDER — NITROGLYCERIN 0.4 MG SL SUBL: 0.4000 mg | SUBLINGUAL_TABLET | SUBLINGUAL | Status: DC | PRN

## 2020-02-22 MED ORDER — TAMSULOSIN HCL 0.4 MG PO CAPS
0.4000 mg | ORAL_CAPSULE | Freq: Every day | ORAL | Status: DC
  Administered 2020-02-22 – 2020-02-24 (×3): 0.4 mg via ORAL
  Filled 2020-02-22 (×3): qty 1

## 2020-02-22 MED ORDER — LACTATED RINGERS IV BOLUS
1000.0000 mL | Freq: Once | INTRAVENOUS | Status: AC
  Administered 2020-02-22: 1000 mL via INTRAVENOUS

## 2020-02-22 MED ORDER — ROSUVASTATIN CALCIUM 20 MG PO TABS
40.0000 mg | ORAL_TABLET | Freq: Every day | ORAL | Status: DC
  Administered 2020-02-22 – 2020-02-24 (×3): 40 mg via ORAL
  Filled 2020-02-22 (×3): qty 4
  Filled 2020-02-22 (×3): qty 2

## 2020-02-22 MED ORDER — RISAQUAD PO CAPS
1.0000 | ORAL_CAPSULE | Freq: Three times a day (TID) | ORAL | Status: DC
  Administered 2020-02-22 (×3): 1 via ORAL
  Filled 2020-02-22 (×3): qty 1

## 2020-02-22 NOTE — Progress Notes (Signed)
PROGRESS NOTE    Patient: Debbie Delacruz                            PCP: [name], MD                    DOB: 1946/02/03            DOA: 02/20/2020 MBW:[id number]             DOS: 02/22/2020, 8:01 AM   LOS: 2 days   Date of Service: The patient was seen and examined on 02/22/2020  Subjective:   The patient was seen and examined this Am. Hemodynamically stable, off pressors Stating she is not feeling well today, worsened yesterday but cannot explain exactly what is bothering her. Otherwise no issues overnight .  Brief Narrative:  T his is a pleasant 74 yo F who is a never smoker, she has a hx of GERD with Barrets esophagus, dyslipidemia, HTN, anxiety disorder, chronic anemia and headaches who came in due to altered mental status with confusion, hypotension and dysuria. She was recently seen by urology for nephrolithiasis as well as a renal mass measuring appx 4cm. She was seen by urology while in ED and taken to OR for left distal ureteral stent placement. Post operatively she remained profoundly hypotensive with shock requiring vasopressor support despite aggressive IVF. She is admitted to MICU for septic shock.    02/21/20- Patient is stable, she is off of IV vasopressors and is on midodrine po decreased to 5mg  tid from 10mg  tid.  She is lucid and is able to eat without assistance.  Plan to move patient to medical floor today with hospitalist [facility] consult.  PT/OT today  02/22/2020 -transferred out of ICU -hemodynamically stable today    Assessment & Plan:   Active Problems:   Sepsis ([place])   Septic shock ([place])   Septic shock - present on admission  -02/21/20- patient is significantly improved overnight,  -transferred out of ICU -improving sepsis- -Hemodynamically stable: 98% on room air, blood pressure 113/70, temp 98.1, pulse 72, RR 20 -Septic shock - Resolved  - due to UTI with nephrolithiasis - purulent material coming from ureteral drain per Urologist  - 9/29  OFF of Levophed and will add vasopressin   -blood pressure improved, on Midrin >> planning to discontinue if BP remained stable    -S/p   LA- IVF - LR at 150/h x 1 more liter for total of 4L -follow up cultures- urine culture >>> growing E. coli, pansensitive -We will continue current antibiotics of Zosyn >>> with anticipation of taper down  -WBC 19.7 >> 26.8 Lactic acid 3.1, 2.6,>> 2.0 Procalcitonin 57.3 >> 25.7   GERD with Barrets esophagus  - protonix IV 40 mg -Stable  Right renal mass   -Urology on case - appreciate input   -continue antibiotics for 10 days -Urology follow-up  Thrombocytopenia -No signs of bleeding, monitoring Platelet 90,  - likely direct toxicity from sepsis  - closely monitor and d/c DVT prophylaxis upon any sign of bleeding   - monitor for DIC - INR, blood oozing from peripheral veins   UTI/nephrolithiasis Status post instrumentation, cystoscopy-stent placement -We will continue IV fluid resuscitation -Follow urine cultures -Continue current IV antibiotics Rocephin   Acute renal failure -Likely due to sepsis, septic shock -Creatinine 2.27 >> 1.51 today -Monitor creatinine, improving -Avoiding nephrotoxins -Continue IV fluids   ENDO - In ICU  she was hypoglycemic\Hyperglycemia protocol CBG Q AC/HS with SSI    ELECTROLYTES -follow labs as needed -replace as needed -pharmacy consultation    Nutritional status:          Cultures; Blood Cultures x 2 >> NGT Urine Culture  >>> E. coli-pansensitive   Antimicrobials: 02/21/2020 IV Zosyn  >>>     Consultants: PCCM/ Urology   ------------------------------------------------------------------------------------------------------------------------------------------------  DVT prophylaxis:  SCD/Compression stockings Code Status:   Code Status: Full Code Family Communication: No family member present at bedside- attempt will be made to update daily The above findings  and plan of care has been discussed with patient (and family )  in detail,  they expressed understanding and agreement of above. -Advance care planning has been discussed.   Admission status:    Status is: Inpatient  Remains inpatient appropriate because:Inpatient level of care appropriate due to severity of illness   Dispo: The patient is from: Home              Anticipated d/c is to: Home              Anticipated d/c date is: > 3 days              Patient currently is not medically stable to d/c.        Procedures:   No admission procedures for hospital encounter.     Antimicrobials:  Anti-infectives (From admission, onward)   Start     Dose/Rate Route Frequency Ordered Stop   02/21/20 1408  piperacillin-tazobactam (ZOSYN) 3.375 GM/50ML IVPB       Note to Pharmacy: Register, Karen   : cabinet override      02/21/20 1408 02/22/20 0214   02/21/20 0930  piperacillin-tazobactam (ZOSYN) IVPB 3.375 g        3.375 g 12.5 mL/hr over 240 Minutes Intravenous Every 8 hours 02/21/20 0927     02/20/20 1315  piperacillin-tazobactam (ZOSYN) IVPB 3.375 g        3.375 g 100 mL/hr over 30 Minutes Intravenous  Once 02/20/20 1309 02/20/20 1431   02/20/20 1100  cefTRIAXone (ROCEPHIN) 1 g in sodium chloride 0.9 % 100 mL IVPB  Status:  Discontinued        1 g 200 mL/hr over 30 Minutes Intravenous Every 24 hours 02/20/20 1050 02/21/20 0921       Medication:   Chlorhexidine Gluconate Cloth  6 each Topical Daily   heparin  5,000 Units Subcutaneous Q8H   midodrine  5 mg Oral TID WC   nystatin   Topical BID   sodium chloride flush  10-40 mL Intracatheter Q12H    docusate sodium, polyethylene glycol, sodium chloride flush   Objective:   Vitals:   02/21/20 1745 02/21/20 2036 02/21/20 2355 02/22/20 0427  BP: (!) 100/59 103/73 99/65 113/70  Pulse:  78 73 72  Resp: 19 20 16 20   Temp:  97.9 F (36.6 C) 98.6 F (37 C) 98.1 F (36.7 C)  TempSrc:  Oral Oral Oral  SpO2:  98%  97% 98%  Weight:    84.5 kg  Height:        Intake/Output Summary (Last 24 hours) at 02/22/2020 0801 Last data filed at 02/22/2020 0529 Gross per 24 hour  Intake 459.63 ml  Output 725 ml  Net -265.37 ml   Filed Weights   02/20/20 1731 02/21/20 0500 02/22/20 0427  Weight: 79.8 kg 83.2 kg 84.5 kg     Examination:   Physical Exam  Constitution:  Alert, cooperative, no distress,  Appears calm and comfortable  Psychiatric: Normal and stable mood and affect, cognition intact,   HEENT: Normocephalic, PERRL, otherwise with in Normal limits  Chest:Chest symmetric Cardio vascular:  S1/S2, RRR, No murmure, No Rubs or Gallops  pulmonary: Clear to auscultation bilaterally, respirations unlabored, negative wheezes / crackles Abdomen: Soft, non-tender, non-distended, bowel sounds,no masses, no organomegaly Muscular skeletal: Limited exam - in bed, able to move all 4 extremities, Normal strength,  Neuro: CNII-XII intact. , normal motor and sensation, reflexes intact  Extremities: No pitting edema lower extremities, +2 pulses  Skin: Dry, warm to touch, negative for any Rashes, No open wounds Wounds: per nursing documentation    ------------------------------------------------------------------------------------------------------------------------------------------    LABs:  CBC Latest Ref Rng & Units 02/21/2020 02/20/2020 02/19/2020  WBC 4.0 - 10.5 K/uL 21.0(H) 19.7(H) 9.4  Hemoglobin 12.0 - 15.0 g/dL 12.3 13.4 14.9  Hematocrit 36 - 46 % 36.1 38.8 43.1  Platelets 150 - 400 K/uL 90(L) 116(L) 185   CMP Latest Ref Rng & Units 02/21/2020 02/20/2020 02/20/2020  Glucose 70 - 99 mg/dL 126(H) - 90  BUN 8 - 23 mg/dL 43(H) - 46(H)  Creatinine 0.44 - 1.00 mg/dL 2.27(H) - 3.25(H)  Sodium 135 - 145 mmol/L 139 - 135  Potassium 3.5 - 5.1 mmol/L 4.5 4.1 3.2(L)  Chloride 98 - 111 mmol/L 106 - 101  CO2 22 - 32 mmol/L 23 - 20(L)  Calcium 8.9 - 10.3 mg/dL 7.8(L) - 8.0(L)  Total Protein 6.5 - 8.1 g/dL - -  6.0(L)  Total Bilirubin 0.3 - 1.2 mg/dL - - 1.3(H)  Alkaline Phos 38 - 126 U/L - - 80  AST 15 - 41 U/L - - 106(H)  ALT 0 - 44 U/L - - 76(H)       Micro Results Recent Results (from the past 240 hour(s))  Respiratory Panel by RT PCR (Flu A&B, Covid) - Nasopharyngeal Swab     Status: None   Collection Time: 02/19/20 12:47 PM   Specimen: Nasopharyngeal Swab  Result Value Ref Range Status   SARS Coronavirus 2 by RT PCR NEGATIVE NEGATIVE Final    Comment: (NOTE) SARS-CoV-2 target nucleic acids are NOT DETECTED.  The SARS-CoV-2 RNA is generally detectable in upper respiratoy specimens during the acute phase of infection. The lowest concentration of SARS-CoV-2 viral copies this assay can detect is 131 copies/mL. A negative result does not preclude SARS-Cov-2 infection and should not be used as the sole basis for treatment or other patient management decisions. A negative result may occur with  improper specimen collection/handling, submission of specimen other than nasopharyngeal swab, presence of viral mutation(s) within the areas targeted by this assay, and inadequate number of viral copies (<131 copies/mL). A negative result must be combined with clinical observations, patient history, and epidemiological information. The expected result is Negative.  Fact Sheet for Patients:  PinkCheek.be  Fact Sheet for Healthcare Providers:  GravelBags.it  This test is no t yet approved or cleared by the [place] FDA and  has been authorized for detection and/or diagnosis of SARS-CoV-2 by FDA under an Emergency Use Authorization (EUA). This EUA will remain  in effect (meaning this test can be used) for the duration of the COVID-19 declaration under Section 564(b)(1) of the Act, 21 U.S.C. section 360bbb-3(b)(1), unless the authorization is terminated or revoked sooner.     Influenza A by PCR NEGATIVE NEGATIVE Final    Influenza B by PCR NEGATIVE NEGATIVE Final    Comment: (NOTE) The  Xpert Xpress SARS-CoV-2/FLU/RSV assay is intended as an aid in  the diagnosis of influenza from Nasopharyngeal swab specimens and  should not be used as a sole basis for treatment. Nasal washings and  aspirates are unacceptable for Xpert Xpress SARS-CoV-2/FLU/RSV  testing.  Fact Sheet for Patients: PinkCheek.be  Fact Sheet for Healthcare Providers: GravelBags.it  This test is not yet approved or cleared by the [place] FDA and  has been authorized for detection and/or diagnosis of SARS-CoV-2 by  FDA under an Emergency Use Authorization (EUA). This EUA will remain  in effect (meaning this test can be used) for the duration of the  Covid-19 declaration under Section 564(b)(1) of the Act, 21  U.S.C. section 360bbb-3(b)(1), unless the authorization is  terminated or revoked. Performed at [facility], [address], [place], [place] [address]   Blood Culture (routine x 2)     Status: None (Preliminary result)   Collection Time: 02/20/20 11:31 AM   Specimen: BLOOD  Result Value Ref Range Status   Specimen Description BLOOD LEFT ANTECUBITAL  Final   Special Requests   Final    BOTTLES DRAWN AEROBIC AND ANAEROBIC Blood Culture adequate volume   Culture   Final    NO GROWTH 2 DAYS Performed at [facility], [address], [place], [place] [address]    Report Status PENDING  Incomplete  Urine culture     Status: Abnormal   Collection Time: 02/20/20 11:31 AM   Specimen: Urine, Random  Result Value Ref Range Status   Specimen Description   Final    URINE, RANDOM Performed at [facility], [address], [place], [place] [address]    Special Requests   Final    NONE Performed at [facility], [address] [place], [place] [address]    Culture >=100,000 COLONIES/mL ESCHERICHIA COLI (A)  Final   Report Status  02/22/2020 FINAL  Final   Organism ID, Bacteria ESCHERICHIA COLI (A)  Final      Susceptibility   Escherichia coli - MIC*    AMPICILLIN 4 SENSITIVE Sensitive     CEFAZOLIN <=4 SENSITIVE Sensitive     CEFTRIAXONE <=0.25 SENSITIVE Sensitive     CIPROFLOXACIN <=0.25 SENSITIVE Sensitive     GENTAMICIN <=1 SENSITIVE Sensitive     IMIPENEM <=0.25 SENSITIVE Sensitive     NITROFURANTOIN <=16 SENSITIVE Sensitive     TRIMETH/SULFA <=20 SENSITIVE Sensitive     AMPICILLIN/SULBACTAM <=2 SENSITIVE Sensitive     PIP/TAZO <=4 SENSITIVE Sensitive     * >=100,000 COLONIES/mL ESCHERICHIA COLI  Blood Culture (routine x 2)     Status: None (Preliminary result)   Collection Time: 02/20/20 11:33 AM   Specimen: BLOOD  Result Value Ref Range Status   Specimen Description BLOOD BLOOD RIGHT HAND  Final   Special Requests   Final    BOTTLES DRAWN AEROBIC AND ANAEROBIC Blood Culture results may not be optimal due to an inadequate volume of blood received in culture bottles   Culture   Final    NO GROWTH 2 DAYS Performed at [facility], [address], [place], [place] [address]    Report Status PENDING  Incomplete  Urine culture     Status: None   Collection Time: 02/20/20  5:25 PM   Specimen: Urine, Catheterized  Result Value Ref Range Status   Specimen Description   Final    URINE, CATHETERIZED Performed at [facility], [address], [place], [place] [address]  Special Requests   Final    NONE Performed at [facility], [address], [place], [place] [address]    Culture   Final    NO GROWTH Performed at [facility], [place] [address] [place], [place] [address]    Report Status 02/21/2020 FINAL  Final    Radiology Reports DG Abdomen 1 View  Result Date: 02/20/2020 CLINICAL DATA:  Left flank pain EXAM: ABDOMEN - 1 VIEW COMPARISON:  CT 02/19/2020 FINDINGS: The bowel gas pattern is normal. Excreted contrast within the left renal collecting system with  mild left hydroureteronephrosis. Previously seen stone near the left UVJ is not clearly seen radiographically. There is a small amount of excreted contrast within the bladder. IMPRESSION: Mild left hydroureteronephrosis. Previously seen stone near the left UVJ is not clearly seen radiographically. Electronically Signed   By: Davina Poke D.O.   On: 02/20/2020 11:10   CT ABDOMEN PELVIS W CONTRAST  Result Date: 02/19/2020 CLINICAL DATA:  LEFT flank pain radiating to LEFT groin with nausea and vomiting since Friday, tea-colored urine with strong smell EXAM: CT ABDOMEN AND PELVIS WITH CONTRAST TECHNIQUE: Multidetector CT imaging of the abdomen and pelvis was performed using the standard protocol following bolus administration of intravenous contrast. Sagittal and coronal MPR images reconstructed from axial data set. CONTRAST:  50mL OMNIPAQUE IOHEXOL 300 MG/ML SOLN IV. No oral contrast. COMPARISON:  07/26/2014 FINDINGS: Lower chest: Lung bases clear Hepatobiliary: Gallbladder surgically absent. Liver normal appearance Pancreas: Normal appearance Spleen: Normal appearance Adrenals/Urinary Tract: Adrenal glands normal appearance. Enhancing mass at superior pole RIGHT kidney measuring 4.2 x 3.5 x 3.8 cm, imaging features most consistent with a renal neoplasm. LEFT hydronephrosis and hydroureter with delay in LEFT nephrogram due to a 5 mm distal LEFT ureteral calculus very near the ureterovesical junction. No additional renal masses. RIGHT ureter decompressed. Bladder unremarkable. Stomach/Bowel: Scattered colonic diverticulosis without evidence of diverticulitis. Appendix surgically absent by history. Stomach decompressed. Bowel loops otherwise unremarkable. Vascular/Lymphatic: Atherosclerotic calcifications aorta, iliac arteries, coronary arteries. Aorta normal caliber. No adenopathy. Scattered pelvic phleboliths. Renal veins and IVC patent. Reproductive: Uterus surgically absent.  No definite adnexal masses.  Other: Mesh from prior ventral hernia repair. No free air or free fluid. No definite recurrent hernia. Musculoskeletal: Osseous demineralization. IMPRESSION: LEFT hydronephrosis and hydroureter due to a 5 mm distal LEFT ureteral calculus very near the ureterovesical junction. Enhancing 4.2 x 3.5 x 3.8 cm RIGHT upper pole renal mass most consistent with a primary renal neoplasm. Colonic diverticulosis without evidence of diverticulitis. Prior mesh ventral hernia repair without obvious recurrence. Aortic Atherosclerosis (ICD10-I70.0). Findings called to Dr. Tamala Julian on 02/19/2020 at 1408 hours. Electronically Signed   By: Lavonia Dana M.D.   On: 02/19/2020 14:09   DG Chest Port 1 View  Result Date: 02/21/2020 CLINICAL DATA:  Pulmonary edema EXAM: PORTABLE CHEST 1 VIEW COMPARISON:  02/20/2020 FINDINGS: Postoperative changes in the mediastinum. Shallow inspiration. Cardiac enlargement with pulmonary vascular congestion and mild interstitial edema. Similar appearance to previous study. No pleural effusions. No pneumothorax. Calcification of the aorta. IMPRESSION: Cardiac enlargement with pulmonary vascular congestion and interstitial edema similar to previous study. Electronically Signed   By: Lucienne Capers M.D.   On: 02/21/2020 00:25   DG Chest Port 1 View  Result Date: 02/20/2020 CLINICAL DATA:  Abnormal chest x-ray. EXAM: PORTABLE CHEST 1 VIEW COMPARISON:  Radiograph earlier today. FINDINGS: Lower lung volumes from prior exam. Prior median sternotomy. Borderline cardiomegaly likely accentuated by low lung volumes. Aortic atherosclerosis.  Increasing streaky opacity at the right lung base. Bronchovascular crowding versus vascular congestion. No significant pleural effusion. No pneumothorax. IMPRESSION: 1. Increasing streaky opacity at the right lung base, may represent atelectasis, pneumonia or aspiration. 2. Lower lung volumes from prior exam. Bronchovascular crowding versus vascular congestion. Electronically  Signed   By: Keith Rake M.D.   On: 02/20/2020 17:51   DG Chest Port 1 View  Result Date: 02/20/2020 CLINICAL DATA:  Weakness, hypotension EXAM: PORTABLE CHEST 1 VIEW COMPARISON:  08/16/2018 FINDINGS: Post CABG changes. The heart size and mediastinal contours are within normal limits. Atherosclerotic calcification of the aortic knob. No focal airspace consolidation, pleural effusion, or pneumothorax. The visualized skeletal structures are unremarkable. IMPRESSION: No active disease. Electronically Signed   By: Davina Poke D.O.   On: 02/20/2020 11:07   DG OR UROLOGY CYSTO IMAGE ([facility] ONLY)  Result Date: 02/20/2020 There is no interpretation for this exam.  This order is for images obtained during a surgical procedure.  Please See "Surgeries" Tab for more information regarding the procedure.    SIGNED: Deatra James, MD, FACP, FHM. [facility],  Pager (please use [email or web address] to page/text)  If 7PM-7AM, please contact night-coverage [email or web address], Password [facility] 02/22/2020, 8:01 AM

## 2020-02-22 NOTE — Progress Notes (Signed)
OT Cancellation Note  Patient Details Name: Debbie Delacruz MRN: [id number] DOB: 28-Jan-1946   Cancelled Treatment:    Reason Eval/Treat Not Completed: Other (comment). Consult received, chart reviewed. Upon initial attempt today, pt with MD for assessment. Will re-attempt next date.  Jeni Salles, MPH, MS, OTR/L ascom [phone] 02/22/20, 4:56 PM

## 2020-02-23 ENCOUNTER — Other Ambulatory Visit: Payer: Self-pay

## 2020-02-23 ENCOUNTER — Inpatient Hospital Stay: Payer: Medicare Other | Admitting: Oncology

## 2020-02-23 DIAGNOSIS — A4151 Sepsis due to Escherichia coli [E. coli]: Secondary | ICD-10-CM | POA: Diagnosis not present

## 2020-02-23 DIAGNOSIS — N179 Acute kidney failure, unspecified: Secondary | ICD-10-CM | POA: Diagnosis not present

## 2020-02-23 DIAGNOSIS — N201 Calculus of ureter: Secondary | ICD-10-CM | POA: Diagnosis not present

## 2020-02-23 DIAGNOSIS — N2889 Other specified disorders of kidney and ureter: Secondary | ICD-10-CM

## 2020-02-23 DIAGNOSIS — A419 Sepsis, unspecified organism: Secondary | ICD-10-CM | POA: Diagnosis not present

## 2020-02-23 LAB — CBC
HCT: 34.6 % — ABNORMAL LOW (ref 36.0–46.0)
Hemoglobin: 11.3 g/dL — ABNORMAL LOW (ref 12.0–15.0)
MCH: 31.2 pg (ref 26.0–34.0)
MCHC: 32.7 g/dL (ref 30.0–36.0)
MCV: 95.6 fL (ref 80.0–100.0)
Platelets: 97 10*3/uL — ABNORMAL LOW (ref 150–400)
RBC: 3.62 MIL/uL — ABNORMAL LOW (ref 3.87–5.11)
RDW: 13.7 % (ref 11.5–15.5)
WBC: 20.1 10*3/uL — ABNORMAL HIGH (ref 4.0–10.5)
nRBC: 0 % (ref 0.0–0.2)

## 2020-02-23 LAB — BASIC METABOLIC PANEL
Anion gap: 7 (ref 5–15)
BUN: 37 mg/dL — ABNORMAL HIGH (ref 8–23)
CO2: 25 mmol/L (ref 22–32)
Calcium: 7.9 mg/dL — ABNORMAL LOW (ref 8.9–10.3)
Chloride: 109 mmol/L (ref 98–111)
Creatinine, Ser: 1.26 mg/dL — ABNORMAL HIGH (ref 0.44–1.00)
GFR calc Af Amer: 49 mL/min — ABNORMAL LOW (ref >60)
GFR calc non Af Amer: 43 mL/min — ABNORMAL LOW (ref >60)
Glucose, Bld: 81 mg/dL (ref 70–99)
Potassium: 3.7 mmol/L (ref 3.5–5.1)
Sodium: 141 mmol/L (ref 135–145)

## 2020-02-23 LAB — LACTIC ACID, PLASMA
Lactic Acid, Venous: 2.6 mmol/L (ref 0.5–1.9)
Lactic Acid, Venous: 2 mmol/L (ref 0.5–1.9)

## 2020-02-23 MED ORDER — BACID PO TABS: 2.0000 | ORAL_TABLET | Freq: Three times a day (TID) | ORAL | Status: DC

## 2020-02-23 MED ORDER — RISAQUAD PO CAPS
1.0000 | ORAL_CAPSULE | Freq: Every day | ORAL | Status: DC
  Administered 2020-02-23 – 2020-02-24 (×2): 1 via ORAL
  Filled 2020-02-23 (×2): qty 1

## 2020-02-23 NOTE — Care Management Important Message (Signed)
Important Message  Patient Details  Name: Debbie Delacruz MRN: [id number] Date of Birth: 16-Jul-1945   Medicare Important Message Given:  Yes     Dannette Barbara 02/23/2020, 12:31 PM

## 2020-02-23 NOTE — Progress Notes (Signed)
PROGRESS NOTE    Patient: Debbie Delacruz                            PCP: [name], MD                    DOB: 11/29/1945            DOA: 02/20/2020 [id number]:[id number]             DOS: 02/23/2020, 12:56 PM   LOS: 3 days   Date of Service: The patient was seen and examined on 02/23/2020  Subjective:   The patient was seen and examined this morning, more awake alert, in no acute distress Reporting improved abdominal pain. Stating she is feeling better than yesterday  Still complaining of generalized weaknesses, Foley catheter still in place   Brief Narrative:  T his is a pleasant 74 yo F who is a never smoker, she has a hx of GERD with Barrets esophagus, dyslipidemia, HTN, anxiety disorder, chronic anemia and headaches who came in due to altered mental status with confusion, hypotension and dysuria. She was recently seen by urology for nephrolithiasis as well as a renal mass measuring appx 4cm. She was seen by urology while in ED and taken to OR for left distal ureteral stent placement. Post operatively she remained profoundly hypotensive with shock requiring vasopressor support despite aggressive IVF. She is admitted to MICU for septic shock.    02/21/20- Patient is stable, she is off of IV vasopressors and is on midodrine po decreased to 5mg  tid from 10mg  tid.  She is lucid and is able to eat without assistance.  Plan to move patient to medical floor today with hospitalist [facility] consult.  PT/OT today  02/22/2020 -transferred out of ICU -hemodynamically stable today  02/23/2020 -hemodynamically stable still having leukocytosis but improved from 26,000, improved lactic acidosis, remains on IV Zosyn,  Assessment & Plan:   Active Problems:   Sepsis ([place])   Septic shock ([place])   Septic shock - present on admission  -02/21/20- patient is significantly improved overnight,  -transferred out of ICU -Sepsis, septic shock physiology resolved -Hemodynamically stable now - due to UTI  with nephrolithiasis - purulent material coming from ureteral drain per Urologist  - 9/29 OFF of Levophed and will add vasopressin   -Blood pressure improved, patient was stepdown from ICU with midodrine which has been discontinued    -S/p   LA- IVF - LR at 150/h x 1 more liter for total of 4L -follow up cultures- urine culture >>> growing E. coli, pansensitive -We will continue current antibiotics of Zosyn >>> with anticipation of taper down switch to p.o. once improved leukocytosis, and remain hemodynamically stable  -WBC 19.7 >> 26.8 >> 20.1 Lactic acid 3.1, 2.6,>> 2.7 >> 2.0 Procalcitonin 57.3 >> 25.7 >> 19.71   GERD with Barrets esophagus  - protonix IV 40 mg -Remained stable  Right renal mass   -Urology on case - appreciate input   -continue antibiotics for 10 days -To follow-up with urology as an outpatient  Thrombocytopenia -Remained stable no signs of bleeding Platelet 90, 96, 97 today  - likely direct toxicity from sepsis  - closely monitor and d/c DVT prophylaxis upon any sign of bleeding   - monitor for DIC - INR, blood oozing from peripheral veins   UTI/nephrolithiasis Status post instrumentation, cystoscopy-stent placement -Status post IV fluid resuscitation -Urine culture positive for E. coli,  pansensitive -Continue current IV antibiotics Rocephin   Acute renal failure -Likely due to sepsis, septic shock -Creatinine 2.27 >> 1.51 >> 1.26 today -Monitoring BUN/creatinine, improving -Avoiding nephrotoxins -Continue IV fluids   ENDO - In ICU she was hypoglycemic\Hyperglycemia protocol CBG Q AC/HS with SSI    ELECTROLYTES -follow labs as needed -replace as needed -pharmacy consultation    Nutritional status:          Cultures; Blood Cultures x 2 >> NGT Urine Culture  >>> E. coli-pansensitive   Antimicrobials: 02/21/2020 IV Zosyn  >>>     Consultants: PCCM/  Urology   ---------------------------------------------------------------------------------------------------------------------------------------------  DVT prophylaxis:  SCD/Compression stockings Code Status:   Code Status: Full Code Family Communication: No family member present at bedside- attempt will be made to update daily The above findings and plan of care has been discussed with patient (and family )  in detail,  they expressed understanding and agreement of above. -Advance care planning has been discussed.   Admission status:    Status is: Inpatient  Remains inpatient appropriate because:Inpatient level of care appropriate due to severity of illness   Dispo: The patient is from: Home              Anticipated d/c is to: Home              Anticipated d/c date is: In 1 to 2 days if continues to show signs of improvement              Patient currently is not medically stable to d/c.        Procedures:   No admission procedures for hospital encounter.     Antimicrobials:  Anti-infectives (From admission, onward)   Start     Dose/Rate Route Frequency Ordered Stop   02/21/20 1408  piperacillin-tazobactam (ZOSYN) 3.375 GM/50ML IVPB       Note to Pharmacy: Register, Karen   : cabinet override      02/21/20 1408 02/22/20 0214   02/21/20 0930  piperacillin-tazobactam (ZOSYN) IVPB 3.375 g        3.375 g 12.5 mL/hr over 240 Minutes Intravenous Every 8 hours 02/21/20 0927     02/20/20 1315  piperacillin-tazobactam (ZOSYN) IVPB 3.375 g        3.375 g 100 mL/hr over 30 Minutes Intravenous  Once 02/20/20 1309 02/20/20 1431   02/20/20 1100  cefTRIAXone (ROCEPHIN) 1 g in sodium chloride 0.9 % 100 mL IVPB  Status:  Discontinued        1 g 200 mL/hr over 30 Minutes Intravenous Every 24 hours 02/20/20 1050 02/21/20 0921       Medication:   acidophilus  1 capsule Oral Daily   Chlorhexidine Gluconate Cloth  6 each Topical Daily   heparin  5,000 Units Subcutaneous Q8H    metoprolol succinate  50 mg Oral Daily   nystatin   Topical BID   rosuvastatin  40 mg Oral Daily   sodium chloride flush  10-40 mL Intracatheter Q12H   tamsulosin  0.4 mg Oral Daily    docusate sodium, HYDROcodone-acetaminophen, nitroGLYCERIN, polyethylene glycol, sodium chloride flush   Objective:   Vitals:   02/22/20 2025 02/23/20 0401 02/23/20 0927 02/23/20 1229  BP: 117/72 123/74 129/75 (!) 143/69  Pulse: 73 71 71 67  Resp: 20 20 16 16   Temp: 98 F (36.7 C) 98.1 F (36.7 C) 98.2 F (36.8 C) (!) 97.5 F (36.4 C)  TempSrc: Oral Oral Oral   SpO2: 97% 98% 97% 98%  Weight:  82.8 kg    Height:        Intake/Output Summary (Last 24 hours) at 02/23/2020 1256 Last data filed at 02/23/2020 1100 Gross per 24 hour  Intake 490 ml  Output 1450 ml  Net -960 ml   Filed Weights   02/21/20 0500 02/22/20 0427 02/23/20 0401  Weight: 83.2 kg 84.5 kg 82.8 kg     Examination:      Physical Exam:   General:  Alert, oriented, cooperative, no distress;   HEENT:  Normocephalic, PERRL, otherwise with in Normal limits   Neuro:  CNII-XII intact. , normal motor and sensation, reflexes intact   Lungs:   Clear to auscultation BL, Respirations unlabored, no wheezes / crackles  Cardio:    S1/S2, RRR, No murmure, No Rubs or Gallops   Abdomen:   Soft, non-tender, bowel sounds active all four quadrants,  no guarding or peritoneal signs.  Muscular skeletal:  Limited exam - in bed, able to move all 4 extremities, Normal strength,  2+ pulses,  symmetric, No pitting edema  Skin:  Dry, warm to touch, negative for any Rashes, No open wounds  Wounds: Please see nursing documentation         ------------------------------------------------------------------------------------------------------------------------------------------    LABs:  CBC Latest Ref Rng & Units 02/23/2020 02/22/2020 02/21/2020  WBC 4.0 - 10.5 K/uL 20.1(H) 26.8(H) 21.0(H)  Hemoglobin 12.0 - 15.0 g/dL 11.3(L) 12.1 12.3   Hematocrit 36 - 46 % 34.6(L) 34.6(L) 36.1  Platelets 150 - 400 K/uL 97(L) 96(L) 90(L)   CMP Latest Ref Rng & Units 02/23/2020 02/22/2020 02/21/2020  Glucose 70 - 99 mg/dL 81 138(H) 126(H)  BUN 8 - 23 mg/dL 37(H) 47(H) 43(H)  Creatinine 0.44 - 1.00 mg/dL 1.26(H) 1.51(H) 2.27(H)  Sodium 135 - 145 mmol/L 141 142 139  Potassium 3.5 - 5.1 mmol/L 3.7 3.9 4.5  Chloride 98 - 111 mmol/L 109 107 106  CO2 22 - 32 mmol/L 25 22 23   Calcium 8.9 - 10.3 mg/dL 7.9(L) 8.1(L) 7.8(L)  Total Protein 6.5 - 8.1 g/dL - 5.5(L) -  Total Bilirubin 0.3 - 1.2 mg/dL - 0.7 -  Alkaline Phos 38 - 126 U/L - 71 -  AST 15 - 41 U/L - 33 -  ALT 0 - 44 U/L - 37 -       Micro Results Recent Results (from the past 240 hour(s))  Respiratory Panel by RT PCR (Flu A&B, Covid) - Nasopharyngeal Swab     Status: None   Collection Time: 02/19/20 12:47 PM   Specimen: Nasopharyngeal Swab  Result Value Ref Range Status   SARS Coronavirus 2 by RT PCR NEGATIVE NEGATIVE Final    Comment: (NOTE) SARS-CoV-2 target nucleic acids are NOT DETECTED.  The SARS-CoV-2 RNA is generally detectable in upper respiratoy specimens during the acute phase of infection. The lowest concentration of SARS-CoV-2 viral copies this assay can detect is 131 copies/mL. A negative result does not preclude SARS-Cov-2 infection and should not be used as the sole basis for treatment or other patient management decisions. A negative result may occur with  improper specimen collection/handling, submission of specimen other than nasopharyngeal swab, presence of viral mutation(s) within the areas targeted by this assay, and inadequate number of viral copies (<131 copies/mL). A negative result must be combined with clinical observations, patient history, and epidemiological information. The expected result is Negative.  Fact Sheet for Patients:  PinkCheek.be  Fact Sheet for Healthcare Providers:   GravelBags.it  This test is no t yet  approved or cleared by the [place] and  has been authorized for detection and/or diagnosis of SARS-CoV-2 by FDA under an Emergency Use Authorization (EUA). This EUA will remain  in effect (meaning this test can be used) for the duration of the COVID-19 declaration under Section 564(b)(1) of the Act, 21 U.S.C. section 360bbb-3(b)(1), unless the authorization is terminated or revoked sooner.     Influenza A by PCR NEGATIVE NEGATIVE Final   Influenza B by PCR NEGATIVE NEGATIVE Final    Comment: (NOTE) The Xpert Xpress SARS-CoV-2/FLU/RSV assay is intended as an aid in  the diagnosis of influenza from Nasopharyngeal swab specimens and  should not be used as a sole basis for treatment. Nasal washings and  aspirates are unacceptable for Xpert Xpress SARS-CoV-2/FLU/RSV  testing.  Fact Sheet for Patients: PinkCheek.be  Fact Sheet for Healthcare Providers: GravelBags.it  This test is not yet approved or cleared by the [place] FDA and  has been authorized for detection and/or diagnosis of SARS-CoV-2 by  FDA under an Emergency Use Authorization (EUA). This EUA will remain  in effect (meaning this test can be used) for the duration of the  Covid-19 declaration under Section 564(b)(1) of the Act, 21  U.S.C. section 360bbb-3(b)(1), unless the authorization is  terminated or revoked. Performed at [facility], [address] [place], [place] [address]   Blood Culture (routine x 2)     Status: None (Preliminary result)   Collection Time: 02/20/20 11:31 AM   Specimen: BLOOD  Result Value Ref Range Status   Specimen Description BLOOD LEFT ANTECUBITAL  Final   Special Requests   Final    BOTTLES DRAWN AEROBIC AND ANAEROBIC Blood Culture adequate volume   Culture   Final    NO GROWTH 3 DAYS Performed at [facility], [address], [place], [place] [address]    Report Status PENDING  Incomplete  Urine culture     Status: Abnormal   Collection Time: 02/20/20 11:31 AM   Specimen: Urine, Random  Result Value Ref Range Status   Specimen Description   Final    URINE, RANDOM Performed at [facility], [address] [place], [place] [address]    Special Requests   Final    NONE Performed at [facility], [address] [place],  [address]    Culture >=100,000 COLONIES/mL ESCHERICHIA COLI (A)  Final   Report Status 02/22/2020 FINAL  Final   Organism ID, Bacteria ESCHERICHIA COLI (A)  Final      Susceptibility   Escherichia coli - MIC*    AMPICILLIN 4 SENSITIVE Sensitive     CEFAZOLIN <=4 SENSITIVE Sensitive     CEFTRIAXONE <=0.25 SENSITIVE Sensitive     CIPROFLOXACIN <=0.25 SENSITIVE Sensitive     GENTAMICIN <=1 SENSITIVE Sensitive     IMIPENEM <=0.25 SENSITIVE Sensitive     NITROFURANTOIN <=16 SENSITIVE Sensitive     TRIMETH/SULFA <=20 SENSITIVE Sensitive     AMPICILLIN/SULBACTAM <=2 SENSITIVE Sensitive     PIP/TAZO <=4 SENSITIVE Sensitive     * >=100,000 COLONIES/mL ESCHERICHIA COLI  Blood Culture (routine x 2)     Status: None (Preliminary result)   Collection Time: 02/20/20 11:33 AM   Specimen: BLOOD  Result Value Ref Range Status   Specimen Description BLOOD BLOOD RIGHT HAND  Final   Special Requests   Final    BOTTLES DRAWN AEROBIC AND ANAEROBIC Blood Culture results may not be optimal due to an inadequate volume  of blood received in culture bottles   Culture   Final    NO GROWTH 3 DAYS Performed at [facility], [address] [place], [place] [address]    Report Status PENDING  Incomplete  Urine culture     Status: None   Collection Time: 02/20/20  5:25 PM   Specimen: Urine, Catheterized  Result Value Ref Range Status   Specimen Description   Final    URINE, CATHETERIZED Performed at [facility], [address] [place], [place] [address]     Special Requests   Final    NONE Performed at [facility], [address] [place], [place] [address]    Culture   Final    NO GROWTH Performed at [facility], [place] [address] [place], [place] [address]    Report Status 02/21/2020 FINAL  Final    Radiology Reports DG Abdomen 1 View  Result Date: 02/20/2020 CLINICAL DATA:  Left flank pain EXAM: ABDOMEN - 1 VIEW COMPARISON:  CT 02/19/2020 FINDINGS: The bowel gas pattern is normal. Excreted contrast within the left renal collecting system with mild left hydroureteronephrosis. Previously seen stone near the left UVJ is not clearly seen radiographically. There is a small amount of excreted contrast within the bladder. IMPRESSION: Mild left hydroureteronephrosis. Previously seen stone near the left UVJ is not clearly seen radiographically. Electronically Signed   By: Davina Poke D.O.   On: 02/20/2020 11:10   CT ABDOMEN PELVIS W CONTRAST  Result Date: 02/19/2020 CLINICAL DATA:  LEFT flank pain radiating to LEFT groin with nausea and vomiting since Friday, tea-colored urine with strong smell EXAM: CT ABDOMEN AND PELVIS WITH CONTRAST TECHNIQUE: Multidetector CT imaging of the abdomen and pelvis was performed using the standard protocol following bolus administration of intravenous contrast. Sagittal and coronal MPR images reconstructed from axial data set. CONTRAST:  67mL OMNIPAQUE IOHEXOL 300 MG/ML SOLN IV. No oral contrast. COMPARISON:  07/26/2014 FINDINGS: Lower chest: Lung bases clear Hepatobiliary: Gallbladder surgically absent. Liver normal appearance Pancreas: Normal appearance Spleen: Normal appearance Adrenals/Urinary Tract: Adrenal glands normal appearance. Enhancing mass at superior pole RIGHT kidney measuring 4.2 x 3.5 x 3.8 cm, imaging features most consistent with a renal neoplasm. LEFT hydronephrosis and hydroureter with delay in LEFT nephrogram due to a 5 mm distal LEFT ureteral calculus very near the ureterovesical  junction. No additional renal masses. RIGHT ureter decompressed. Bladder unremarkable. Stomach/Bowel: Scattered colonic diverticulosis without evidence of diverticulitis. Appendix surgically absent by history. Stomach decompressed. Bowel loops otherwise unremarkable. Vascular/Lymphatic: Atherosclerotic calcifications aorta, iliac arteries, coronary arteries. Aorta normal caliber. No adenopathy. Scattered pelvic phleboliths. Renal veins and IVC patent. Reproductive: Uterus surgically absent.  No definite adnexal masses. Other: Mesh from prior ventral hernia repair. No free air or free fluid. No definite recurrent hernia. Musculoskeletal: Osseous demineralization. IMPRESSION: LEFT hydronephrosis and hydroureter due to a 5 mm distal LEFT ureteral calculus very near the ureterovesical junction. Enhancing 4.2 x 3.5 x 3.8 cm RIGHT upper pole renal mass most consistent with a primary renal neoplasm. Colonic diverticulosis without evidence of diverticulitis. Prior mesh ventral hernia repair without obvious recurrence. Aortic Atherosclerosis (ICD10-I70.0). Findings called to Dr. Tamala Julian on 02/19/2020 at 1408 hours. Electronically Signed   By: Lavonia Dana M.D.   On: 02/19/2020 14:09   DG Chest Port 1 View  Result Date: 02/21/2020 CLINICAL DATA:  Pulmonary edema EXAM: PORTABLE CHEST 1 VIEW COMPARISON:  02/20/2020 FINDINGS: Postoperative changes in the mediastinum. Shallow inspiration. Cardiac enlargement with pulmonary vascular  congestion and mild interstitial edema. Similar appearance to previous study. No pleural effusions. No pneumothorax. Calcification of the aorta. IMPRESSION: Cardiac enlargement with pulmonary vascular congestion and interstitial edema similar to previous study. Electronically Signed   By: Lucienne Capers M.D.   On: 02/21/2020 00:25   DG Chest Port 1 View  Result Date: 02/20/2020 CLINICAL DATA:  Abnormal chest x-ray. EXAM: PORTABLE CHEST 1 VIEW COMPARISON:  Radiograph earlier today. FINDINGS:  Lower lung volumes from prior exam. Prior median sternotomy. Borderline cardiomegaly likely accentuated by low lung volumes. Aortic atherosclerosis. Increasing streaky opacity at the right lung base. Bronchovascular crowding versus vascular congestion. No significant pleural effusion. No pneumothorax. IMPRESSION: 1. Increasing streaky opacity at the right lung base, may represent atelectasis, pneumonia or aspiration. 2. Lower lung volumes from prior exam. Bronchovascular crowding versus vascular congestion. Electronically Signed   By: Keith Rake M.D.   On: 02/20/2020 17:51   DG Chest Port 1 View  Result Date: 02/20/2020 CLINICAL DATA:  Weakness, hypotension EXAM: PORTABLE CHEST 1 VIEW COMPARISON:  08/16/2018 FINDINGS: Post CABG changes. The heart size and mediastinal contours are within normal limits. Atherosclerotic calcification of the aortic knob. No focal airspace consolidation, pleural effusion, or pneumothorax. The visualized skeletal structures are unremarkable. IMPRESSION: No active disease. Electronically Signed   By: Davina Poke D.O.   On: 02/20/2020 11:07   DG OR UROLOGY CYSTO IMAGE ([facility] ONLY)  Result Date: 02/20/2020 There is no interpretation for this exam.  This order is for images obtained during a surgical procedure.  Please See "Surgeries" Tab for more information regarding the procedure.    SIGNED: Deatra James, MD, FACP, FHM. [facility],  Pager (please use [email or web address] to page/text)  If 7PM-7AM, please contact night-coverage [email or web address] 02/23/2020, 12:56 PM

## 2020-02-23 NOTE — Plan of Care (Signed)
Continuing with plan of care. 

## 2020-02-23 NOTE — Progress Notes (Signed)
OT Screen Note  Patient Details Name: Debbie Delacruz MRN: [id number] DOB: 05-25-1946   Cancelled Treatment:    Reason Eval/Treat Not Completed: OT screened, no needs identified, will sign off. Consult received, chart reviewed. Pt endorses feeling close to baseline, denies difficulty with basic ADL tasks. Limited impairments without significant impact on functional ADL independence/safety. 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 Pt verbalized understanding. No additional skilled OT needs at this time. Will sign off.   Jeni Salles, MPH, MS, OTR/L ascom [phone] 02/23/20, 10:09 AM

## 2020-02-24 DIAGNOSIS — N179 Acute kidney failure, unspecified: Secondary | ICD-10-CM | POA: Diagnosis not present

## 2020-02-24 DIAGNOSIS — A419 Sepsis, unspecified organism: Secondary | ICD-10-CM | POA: Diagnosis not present

## 2020-02-24 DIAGNOSIS — N201 Calculus of ureter: Secondary | ICD-10-CM | POA: Diagnosis not present

## 2020-02-24 DIAGNOSIS — R9389 Abnormal findings on diagnostic imaging of other specified body structures: Secondary | ICD-10-CM | POA: Diagnosis not present

## 2020-02-24 LAB — BASIC METABOLIC PANEL
Anion gap: 8 (ref 5–15)
BUN: 19 mg/dL (ref 8–23)
CO2: 25 mmol/L (ref 22–32)
Calcium: 8 mg/dL — ABNORMAL LOW (ref 8.9–10.3)
Chloride: 105 mmol/L (ref 98–111)
Creatinine, Ser: 1.02 mg/dL — ABNORMAL HIGH (ref 0.44–1.00)
GFR calc Af Amer: >60 mL/min (ref >60)
GFR calc non Af Amer: 55 mL/min — ABNORMAL LOW (ref >60)
Glucose, Bld: 111 mg/dL — ABNORMAL HIGH (ref 70–99)
Potassium: 3.3 mmol/L — ABNORMAL LOW (ref 3.5–5.1)
Sodium: 138 mmol/L (ref 135–145)

## 2020-02-24 LAB — CBC
HCT: 35.3 % — ABNORMAL LOW (ref 36.0–46.0)
Hemoglobin: 12.3 g/dL (ref 12.0–15.0)
MCH: 31.5 pg (ref 26.0–34.0)
MCHC: 34.8 g/dL (ref 30.0–36.0)
MCV: 90.3 fL (ref 80.0–100.0)
Platelets: 95 10*3/uL — ABNORMAL LOW (ref 150–400)
RBC: 3.91 MIL/uL (ref 3.87–5.11)
RDW: 13.4 % (ref 11.5–15.5)
WBC: 11.1 10*3/uL — ABNORMAL HIGH (ref 4.0–10.5)
nRBC: 0 % (ref 0.0–0.2)

## 2020-02-24 MED ORDER — CIPROFLOXACIN HCL 500 MG PO TABS: 500.0000 mg | ORAL_TABLET | Freq: Two times a day (BID) | ORAL | 0 refills | Status: AC

## 2020-02-24 MED ORDER — RISAQUAD PO CAPS: 1.0000 | ORAL_CAPSULE | Freq: Every day | ORAL | 0 refills | Status: AC

## 2020-02-24 MED ORDER — AMLODIPINE BESYLATE 10 MG PO TABS
10.0000 mg | ORAL_TABLET | Freq: Every day | ORAL | Status: DC
  Administered 2020-02-24: 10 mg via ORAL
  Filled 2020-02-24: qty 1

## 2020-02-24 NOTE — Plan of Care (Signed)
Patient is to discharge home and in stable condition.

## 2020-02-24 NOTE — Discharge Summary (Signed)
Physician Discharge Summary [facility]    Patient: Debbie Delacruz                   Admit date: 02/20/2020   DOB: [birth year]             Discharge date:02/24/2020/2:42 PM JIR:[id number]                          PCP: [name], MD  Disposition: HOME   Recommendations for Outpatient Follow-up:   . Follow up: in 1 week  Discharge Condition: Stable   Code Status:   Code Status: Full Code  Diet recommendation: Regular healthy diet   Discharge Diagnoses:    Active Problems:   Sepsis ([place])   Septic shock ([place])   History of Present Illness/ Hospital Course [name] Summary:   This is a pleasant 74 yo F who is a never smoker, she has a hx of GERD with Barrets esophagus, dyslipidemia, HTN, anxiety disorder, chronic anemia and headaches who came in due to altered mental status with confusion, hypotension and dysuria. She was recently seen by urology for nephrolithiasis as well as a renal mass measuring appx 4cm. She was seen by urology while in ED and taken to OR for left distal ureteral stent placement. Post operatively she remained profoundly hypotensive with shock requiring vasopressor support despite aggressive IVF. She is admitted to MICU for septic shock.    02/21/20-Patient is stable, she is off of IV vasopressors and is on midodrine po decreased to 5mg  tid from 10mg  tid. She is lucid and is able to eat without assistance. Plan to move patient to medical floor today with hospitalist [facility] consult. PT/OT today  02/22/2020 -transferred out of ICU -hemodynamically stable today  02/23/2020 -hemodynamically stable still having leukocytosis but improved from 26,000, improved lactic acidosis, remains on IV Zosyn,  02/24/2020 -patient remained hemodynamically stable, based on cultures, Zosyn was switched to p.o. ciprofloxacin, improved sepsis physiology, patient will be discharged home   Septic shock - present on admission  -02/21/20-patient is  significantly improved overnight,  -transferred out of ICU -Sepsis, septic shock physiology resolved -Hemodynamically stable now - due to UTI with nephrolithiasis - purulent material coming from ureteral drain per Urologist  - 9/29 OFF of Levophed and will add vasopressin   -Blood pressure improved, patient was stepdown from ICU with midodrine which has been discontinued    -S/p   LA- IVF - LR at 150/h x 1 more liter for total of 4L -follow up cultures- urine culture >>> growing E. coli, pansensitive -We will continue current antibiotics of Zosyn >>> switch to p.o. ciprofloxacin Hemodynamically stable, improved leukocytosis, lactic acid  -WBC 19.7 >> 26.8 >> 20.1 >> 11.1 Lactic acid 3.1, 2.6,>> 2.7 >> 2.0 Procalcitonin 57.3 >> 25.7 >> 19.71   GERD with Barrets esophagus - protonix IV 40 mg -Remained stable  Right renal mass -Urology on case - appreciate input -continue antibiotics for 10 days -To follow-up with urology as an outpatient  Thrombocytopenia -Remained stable no signs of bleeding Platelet 90, 96, 97 today  - likely direct toxicity from sepsis - closely monitor and d/c DVT prophylaxis upon any sign of bleeding  - monitor for DIC - INR, blood oozing from peripheral veins   UTI/nephrolithiasis Status post instrumentation, cystoscopy-stent placement -Status post IV fluid resuscitation -Urine culture positive for E. coli, pansensitive -Continue current IV antibiotics Zosyn switched to p.o. ciprofloxacin   Acute renal failure -  Likely due to sepsis, septic shock--resolved -Creatinine 2.27 >> 1.51 >> 1.26 today -Monitoring BUN/creatinine, improving -Avoiding nephrotoxins -Status post IV fluid hydration   ENDO - In ICU she was hypoglycemic\Hyperglycemia protocol -Resolved   ELECTROLYTES -Was repleted        Cultures; Blood Cultures x 2 >> NGT Urine Culture  >>> E. coli-pansensitive   Antimicrobials: 02/21/2020 IV Zosyn   >>> 02/24/20 02/25/2020 ciprofloxacin 500 mg p.o. twice daily    Consultants: PCCM/ Urology     Nutritional status:          Discharge Instructions:   Discharge Instructions    Activity as tolerated - No restrictions   Complete by: As directed    Diet - low sodium heart healthy   Complete by: As directed    Discharge instructions   Complete by: As directed    Follow-up with urology for further evaluation recommendations and treatment plan   Discharge wound care:   Complete by: As directed    Continue wound care per instructions   Increase activity slowly   Complete by: As directed        Medication List    STOP taking these medications   hydrOXYzine 10 MG tablet Commonly known as: ATARAX/VISTARIL     TAKE these medications   acetaminophen 500 MG tablet Commonly known as: TYLENOL Take 500 mg by mouth every 6 (six) hours as needed for headache.   acidophilus Caps capsule Take 1 capsule by mouth daily for 10 days.   amLODipine 5 MG tablet Commonly known as: NORVASC Take 1 tablet (5 mg total) by mouth daily.   aspirin EC 81 MG tablet Take 1 tablet (81 mg total) by mouth daily.   ciprofloxacin 500 MG tablet Commonly known as: Cipro Take 1 tablet (500 mg total) by mouth 2 (two) times daily for 10 days.   HYDROcodone-acetaminophen 5-325 MG tablet Commonly known as: Norco Take 1 tablet by mouth every 6 (six) hours as needed for up to 5 days for severe pain.   isosorbide mononitrate 30 MG 24 hr tablet Commonly known as: IMDUR Take 1 tablet (30 mg total) by mouth daily.   lisinopril 10 MG tablet Commonly known as: ZESTRIL TAKE 1 TABLET BY MOUTH ONCE DAILY. PLEASE CALL TO SCHEDULE APPOINTMENT FOR FUTURE REFILLS   metoprolol succinate 50 MG 24 hr tablet Commonly known as: TOPROL-XL TAKE 1 TABLET BY MOUTH ONCE DAILY   nitroGLYCERIN 0.4 MG SL tablet Commonly known as: NITROSTAT Place 1 tablet (0.4 mg total) under the tongue every 5 (five) minutes as  needed for chest pain. Take for up to 3 doses and then call 911 if still having chest pain.   ondansetron 4 MG tablet Commonly known as: Zofran Take 1 tablet (4 mg total) by mouth every 8 (eight) hours as needed for up to 10 doses for nausea or vomiting.   rosuvastatin 40 MG tablet Commonly known as: CRESTOR TAKE 1 TABLET BY MOUTH ONCE DAILY   tamsulosin 0.4 MG Caps capsule Commonly known as: Flomax Take 1 capsule (0.4 mg total) by mouth daily for 5 days.       Allergies  Allergen Reactions  . Ferrous Sulfate [Ferrous Fumarate] Other (See Comments)    STOMACH ISSUES  . Lipitor [Atorvastatin] Other (See Comments)    myalgia     Procedures /Studies:   DG Abdomen 1 View  Result Date: 02/20/2020 CLINICAL DATA:  Left flank pain EXAM: ABDOMEN - 1 VIEW COMPARISON:  CT 02/19/2020 FINDINGS: The bowel  gas pattern is normal. Excreted contrast within the left renal collecting system with mild left hydroureteronephrosis. Previously seen stone near the left UVJ is not clearly seen radiographically. There is a small amount of excreted contrast within the bladder. IMPRESSION: Mild left hydroureteronephrosis. Previously seen stone near the left UVJ is not clearly seen radiographically. Electronically Signed   By: Davina Poke D.O.   On: 02/20/2020 11:10   CT ABDOMEN PELVIS W CONTRAST  Result Date: 02/19/2020 CLINICAL DATA:  LEFT flank pain radiating to LEFT groin with nausea and vomiting since Friday, tea-colored urine with strong smell EXAM: CT ABDOMEN AND PELVIS WITH CONTRAST TECHNIQUE: Multidetector CT imaging of the abdomen and pelvis was performed using the standard protocol following bolus administration of intravenous contrast. Sagittal and coronal MPR images reconstructed from axial data set. CONTRAST:  80mL OMNIPAQUE IOHEXOL 300 MG/ML SOLN IV. No oral contrast. COMPARISON:  07/26/2014 FINDINGS: Lower chest: Lung bases clear Hepatobiliary: Gallbladder surgically absent. Liver normal  appearance Pancreas: Normal appearance Spleen: Normal appearance Adrenals/Urinary Tract: Adrenal glands normal appearance. Enhancing mass at superior pole RIGHT kidney measuring 4.2 x 3.5 x 3.8 cm, imaging features most consistent with a renal neoplasm. LEFT hydronephrosis and hydroureter with delay in LEFT nephrogram due to a 5 mm distal LEFT ureteral calculus very near the ureterovesical junction. No additional renal masses. RIGHT ureter decompressed. Bladder unremarkable. Stomach/Bowel: Scattered colonic diverticulosis without evidence of diverticulitis. Appendix surgically absent by history. Stomach decompressed. Bowel loops otherwise unremarkable. Vascular/Lymphatic: Atherosclerotic calcifications aorta, iliac arteries, coronary arteries. Aorta normal caliber. No adenopathy. Scattered pelvic phleboliths. Renal veins and IVC patent. Reproductive: Uterus surgically absent.  No definite adnexal masses. Other: Mesh from prior ventral hernia repair. No free air or free fluid. No definite recurrent hernia. Musculoskeletal: Osseous demineralization. IMPRESSION: LEFT hydronephrosis and hydroureter due to a 5 mm distal LEFT ureteral calculus very near the ureterovesical junction. Enhancing 4.2 x 3.5 x 3.8 cm RIGHT upper pole renal mass most consistent with a primary renal neoplasm. Colonic diverticulosis without evidence of diverticulitis. Prior mesh ventral hernia repair without obvious recurrence. Aortic Atherosclerosis (ICD10-I70.0). Findings called to Dr. Tamala Julian on 02/19/2020 at 1408 hours. Electronically Signed   By: Lavonia Dana M.D.   On: 02/19/2020 14:09   DG Chest Port 1 View  Result Date: 02/21/2020 CLINICAL DATA:  Pulmonary edema EXAM: PORTABLE CHEST 1 VIEW COMPARISON:  02/20/2020 FINDINGS: Postoperative changes in the mediastinum. Shallow inspiration. Cardiac enlargement with pulmonary vascular congestion and mild interstitial edema. Similar appearance to previous study. No pleural effusions. No  pneumothorax. Calcification of the aorta. IMPRESSION: Cardiac enlargement with pulmonary vascular congestion and interstitial edema similar to previous study. Electronically Signed   By: Lucienne Capers M.D.   On: 02/21/2020 00:25   DG Chest Port 1 View  Result Date: 02/20/2020 CLINICAL DATA:  Abnormal chest x-ray. EXAM: PORTABLE CHEST 1 VIEW COMPARISON:  Radiograph earlier today. FINDINGS: Lower lung volumes from prior exam. Prior median sternotomy. Borderline cardiomegaly likely accentuated by low lung volumes. Aortic atherosclerosis. Increasing streaky opacity at the right lung base. Bronchovascular crowding versus vascular congestion. No significant pleural effusion. No pneumothorax. IMPRESSION: 1. Increasing streaky opacity at the right lung base, may represent atelectasis, pneumonia or aspiration. 2. Lower lung volumes from prior exam. Bronchovascular crowding versus vascular congestion. Electronically Signed   By: Keith Rake M.D.   On: 02/20/2020 17:51   DG Chest Port 1 View  Result Date: 02/20/2020 CLINICAL DATA:  Weakness, hypotension EXAM: PORTABLE CHEST 1 VIEW COMPARISON:  08/16/2018 FINDINGS:  Post CABG changes. The heart size and mediastinal contours are within normal limits. Atherosclerotic calcification of the aortic knob. No focal airspace consolidation, pleural effusion, or pneumothorax. The visualized skeletal structures are unremarkable. IMPRESSION: No active disease. Electronically Signed   By: Davina Poke D.O.   On: 02/20/2020 11:07   DG OR UROLOGY CYSTO IMAGE ([facility] ONLY)  Result Date: 02/20/2020 There is no interpretation for this exam.  This order is for images obtained during a surgical procedure.  Please See "Surgeries" Tab for more information regarding the procedure.    Subjective:   Patient was seen and examined 02/24/2020, 2:42 PM Patient stable today. No acute distress.  No issues overnight Stable for discharge.  Discharge Exam:    Vitals:   02/24/20  0445 02/24/20 0451 02/24/20 0518 02/24/20 1200  BP: (!) 161/81 (!) 145/78  (!) 166/82  Pulse: 65 66  65  Resp: 20   16  Temp: 98.6 F (37 C)   98.6 F (37 C)  TempSrc: Oral   Oral  SpO2: 97% 95%  97%  Weight:   77.3 kg   Height:        General: Pt lying comfortably in bed & appears in no obvious distress. Cardiovascular: S1 & S2 heard, RRR, S1/S2 +. No murmurs, rubs, gallops or clicks. No JVD or pedal edema. Respiratory: Clear to auscultation without wheezing, rhonchi or crackles. No increased work of breathing. Abdominal:  Non-distended, non-tender & soft. No organomegaly or masses appreciated. Normal bowel sounds heard. CNS: Alert and oriented. No focal deficits. Extremities: no edema, no cyanosis    The results of significant diagnostics from this hospitalization (including imaging, microbiology, ancillary and laboratory) are listed below for reference.      Microbiology:   Recent Results (from the past 240 hour(s))  Respiratory Panel by RT PCR (Flu A&B, Covid) - Nasopharyngeal Swab     Status: None   Collection Time: 02/19/20 12:47 PM   Specimen: Nasopharyngeal Swab  Result Value Ref Range Status   SARS Coronavirus 2 by RT PCR NEGATIVE NEGATIVE Final    Comment: (NOTE) SARS-CoV-2 target nucleic acids are NOT DETECTED.  The SARS-CoV-2 RNA is generally detectable in upper respiratoy specimens during the acute phase of infection. The lowest concentration of SARS-CoV-2 viral copies this assay can detect is 131 copies/mL. A negative result does not preclude SARS-Cov-2 infection and should not be used as the sole basis for treatment or other patient management decisions. A negative result may occur with  improper specimen collection/handling, submission of specimen other than nasopharyngeal swab, presence of viral mutation(s) within the areas targeted by this assay, and inadequate number of viral copies (<131 copies/mL). A negative result must be combined with  clinical observations, patient history, and epidemiological information. The expected result is Negative.  Fact Sheet for Patients:  PinkCheek.be  Fact Sheet for Healthcare Providers:  GravelBags.it  This test is no t yet approved or cleared by the [place] FDA and  has been authorized for detection and/or diagnosis of SARS-CoV-2 by FDA under an Emergency Use Authorization (EUA). This EUA will remain  in effect (meaning this test can be used) for the duration of the COVID-19 declaration under Section 564(b)(1) of the Act, 21 U.S.C. section 360bbb-3(b)(1), unless the authorization is terminated or revoked sooner.     Influenza A by PCR NEGATIVE NEGATIVE Final   Influenza B by PCR NEGATIVE NEGATIVE Final    Comment: (NOTE) The Xpert Xpress SARS-CoV-2/FLU/RSV assay is intended as an aid  in  the diagnosis of influenza from Nasopharyngeal swab specimens and  should not be used as a sole basis for treatment. Nasal washings and  aspirates are unacceptable for Xpert Xpress SARS-CoV-2/FLU/RSV  testing.  Fact Sheet for Patients: PinkCheek.be  Fact Sheet for Healthcare Providers: GravelBags.it  This test is not yet approved or cleared by the [place] FDA and  has been authorized for detection and/or diagnosis of SARS-CoV-2 by  FDA under an Emergency Use Authorization (EUA). This EUA will remain  in effect (meaning this test can be used) for the duration of the  Covid-19 declaration under Section 564(b)(1) of the Act, 21  U.S.C. section 360bbb-3(b)(1), unless the authorization is  terminated or revoked. Performed at [facility], [address], [place], [place] [address]   Blood Culture (routine x 2)     Status: None (Preliminary result)   Collection Time: 02/20/20 11:31 AM   Specimen: BLOOD  Result Value Ref Range Status   Specimen Description  BLOOD LEFT ANTECUBITAL  Final   Special Requests   Final    BOTTLES DRAWN AEROBIC AND ANAEROBIC Blood Culture adequate volume   Culture   Final    NO GROWTH 4 DAYS Performed at [facility], [address] [place], [place] [address]    Report Status PENDING  Incomplete  Urine culture     Status: Abnormal   Collection Time: 02/20/20 11:31 AM   Specimen: Urine, Random  Result Value Ref Range Status   Specimen Description   Final    URINE, RANDOM Performed at [facility], [address], [place], [place] [address]    Special Requests   Final    NONE Performed at [facility], [address] [place], [place] [address]    Culture >=100,000 COLONIES/mL ESCHERICHIA COLI (A)  Final   Report Status 02/22/2020 FINAL  Final   Organism ID, Bacteria ESCHERICHIA COLI (A)  Final      Susceptibility   Escherichia coli - MIC*    AMPICILLIN 4 SENSITIVE Sensitive     CEFAZOLIN <=4 SENSITIVE Sensitive     CEFTRIAXONE <=0.25 SENSITIVE Sensitive     CIPROFLOXACIN <=0.25 SENSITIVE Sensitive     GENTAMICIN <=1 SENSITIVE Sensitive     IMIPENEM <=0.25 SENSITIVE Sensitive     NITROFURANTOIN <=16 SENSITIVE Sensitive     TRIMETH/SULFA <=20 SENSITIVE Sensitive     AMPICILLIN/SULBACTAM <=2 SENSITIVE Sensitive     PIP/TAZO <=4 SENSITIVE Sensitive     * >=100,000 COLONIES/mL ESCHERICHIA COLI  Blood Culture (routine x 2)     Status: None (Preliminary result)   Collection Time: 02/20/20 11:33 AM   Specimen: BLOOD  Result Value Ref Range Status   Specimen Description BLOOD BLOOD RIGHT HAND  Final   Special Requests   Final    BOTTLES DRAWN AEROBIC AND ANAEROBIC Blood Culture results may not be optimal due to an inadequate volume of blood received in culture bottles   Culture   Final    NO GROWTH 4 DAYS Performed at [facility], [address] [place], [place] [address]    Report Status PENDING  Incomplete  Urine culture     Status: None   Collection Time:  02/20/20  5:25 PM   Specimen: Urine, Catheterized  Result Value Ref Range Status   Specimen Description   Final    URINE, CATHETERIZED Performed at [facility], [address], [place], [place] [address]    Special Requests   Final  NONE Performed at [facility], [address] [place], [place] [address]    Culture   Final    NO GROWTH Performed at [facility], [place] [address], [place], [place] [address]    Report Status 02/21/2020 FINAL  Final     Labs:   CBC: Recent Labs  Lab 02/20/20 1131 02/21/20 0624 02/22/20 0943 02/23/20 0413 02/24/20 0456  WBC 19.7* 21.0* 26.8* 20.1* 11.1*  NEUTROABS 17.6*  --  20.3*  --   --   HGB 13.4 12.3 12.1 11.3* 12.3  HCT 38.8 36.1 34.6* 34.6* 35.3*  MCV 92.2 93.5 90.6 95.6 90.3  PLT 116* 90* 96* 97* 95*   Basic Metabolic Panel: Recent Labs  Lab 02/20/20 1131 02/20/20 1131 02/20/20 2031 02/21/20 0624 02/22/20 0943 02/23/20 0413 02/24/20 0456  NA 135  --   --  139 142 141 138  K 3.2*   < > 4.1 4.5 3.9 3.7 3.3*  CL 101  --   --  106 107 109 105  CO2 20*  --   --  23 22 25 25   GLUCOSE 90  --   --  126* 138* 81 111*  BUN 46*  --   --  43* 47* 37* 19  CREATININE 3.25*  --   --  2.27* 1.51* 1.26* 1.02*  CALCIUM 8.0*  --   --  7.8* 8.1* 7.9* 8.0*  MG  --   --  1.5* 2.9*  --   --   --   PHOS  --   --   --  5.1*  --   --   --    < > = values in this interval not displayed.   Liver Function Tests: Recent Labs  Lab 02/19/20 0615 02/20/20 1131 02/22/20 0943  AST 17 106* 33  ALT 13 76* 37  ALKPHOS 42 80 71  BILITOT 1.0 1.3* 0.7  PROT 6.8 6.0* 5.5*  ALBUMIN 3.6 3.2* 2.5*   BNP (last 3 results) No results for input(s): BNP in the last 8760 hours. Cardiac Enzymes: Recent Labs  Lab 02/20/20 1131  CKTOTAL 82   CBG: No results for input(s): GLUCAP in the last 168 hours. Hgb A1c No results for input(s): HGBA1C in the last 72 hours. Lipid Profile No results for input(s): CHOL, HDL, LDLCALC,  TRIG, CHOLHDL, LDLDIRECT in the last 72 hours. Thyroid function studies No results for input(s): TSH, T4TOTAL, T3FREE, THYROIDAB in the last 72 hours.  Invalid input(s): FREET3 Anemia work up No results for input(s): VITAMINB12, FOLATE, FERRITIN, TIBC, IRON, RETICCTPCT in the last 72 hours. Urinalysis    Component Value Date/Time   COLORURINE YELLOW (A) 02/20/2020 1725   APPEARANCEUR CLOUDY (A) 02/20/2020 1725   APPEARANCEUR Clear 06/05/2014 0803   LABSPEC 1.010 02/20/2020 1725   LABSPEC 1.012 06/05/2014 0803   PHURINE 5.0 02/20/2020 1725   GLUCOSEU NEGATIVE 02/20/2020 1725   GLUCOSEU Negative 06/05/2014 0803   HGBUR LARGE (A) 02/20/2020 1725   HGBUR negative 01/17/2007 1504   BILIRUBINUR NEGATIVE 02/20/2020 1725   BILIRUBINUR Negative 06/05/2014 0803   KETONESUR NEGATIVE 02/20/2020 1725   PROTEINUR 30 (A) 02/20/2020 1725   UROBILINOGEN 0.2 03/07/2015 2045   NITRITE NEGATIVE 02/20/2020 1725   LEUKOCYTESUR NEGATIVE 02/20/2020 1725   LEUKOCYTESUR Negative 06/05/2014 0803         Time coordinating discharge: Over 45 minutes  SIGNED: Deatra James, MD, FACP, FHM. [facility],  Please use [email or web address] to Page If 7PM-7AM, please contact  night-coverage [email or web address] [email or web address] [facility] 02/24/2020, 2:42 PM

## 2020-02-24 NOTE — Progress Notes (Signed)
This encounter was created in error - please disregard.

## 2020-02-24 NOTE — Progress Notes (Signed)
Physician Discharge Summary [facility]    Patient: Debbie Delacruz                   Admit date: 02/20/2020   DOB: 12-31-1945             Discharge date:02/24/2020/8:36 AM DDU:[id number]                          PCP: [name], MD  Disposition: HOME   Recommendations for Outpatient Follow-up:   . Follow up: in 1 week  Discharge Condition: Stable   Code Status:   Code Status: Full Code  Diet recommendation: Regular healthy diet   Discharge Diagnoses:    Active Problems:   Sepsis ([place])   Septic shock ([place])   History of Present Illness/ Hospital Course Debbie Delacruz Summary:   This is a pleasant 74 yo F who is a never smoker, she has a hx of GERD with Barrets esophagus, dyslipidemia, HTN, anxiety disorder, chronic anemia and headaches who came in due to altered mental status with confusion, hypotension and dysuria. She was recently seen by urology for nephrolithiasis as well as a renal mass measuring appx 4cm. She was seen by urology while in ED and taken to OR for left distal ureteral stent placement. Post operatively she remained profoundly hypotensive with shock requiring vasopressor support despite aggressive IVF. She is admitted to MICU for septic shock.    02/21/20-Patient is stable, she is off of IV vasopressors and is on midodrine po decreased to 5mg  tid from 10mg  tid. She is lucid and is able to eat without assistance. Plan to move patient to medical floor today with hospitalist [facility] consult. PT/OT today  02/22/2020 -transferred out of ICU -hemodynamically stable today  02/23/2020 -hemodynamically stable still having leukocytosis but improved from 26,000, improved lactic acidosis, remains on IV Zosyn,  02/24/2020 -patient remained hemodynamically stable, based on cultures, Zosyn was switched to p.o. ciprofloxacin, improved sepsis physiology, patient will be discharged home   Septic shock - present on admission  -02/21/20-patient is  significantly improved overnight,  -transferred out of ICU -Sepsis, septic shock physiology resolved -Hemodynamically stable now - due to UTI with nephrolithiasis - purulent material coming from ureteral drain per Urologist  - 9/29 OFF of Levophed and will add vasopressin   -Blood pressure improved, patient was stepdown from ICU with midodrine which has been discontinued    -S/p   LA- IVF - LR at 150/h x 1 more liter for total of 4L -follow up cultures- urine culture >>> growing E. coli, pansensitive -We will continue current antibiotics of Zosyn >>> switch to p.o. ciprofloxacin Hemodynamically stable, improved leukocytosis, lactic acid  -WBC 19.7 >> 26.8 >> 20.1 >> 11.1 Lactic acid 3.1, 2.6,>> 2.7 >> 2.0 Procalcitonin 57.3 >> 25.7 >> 19.71   GERD with Barrets esophagus - protonix IV 40 mg -Remained stable  Right renal mass -Urology on case - appreciate input -continue antibiotics for 10 days -To follow-up with urology as an outpatient  Thrombocytopenia -Remained stable no signs of bleeding Platelet 90, 96, 97 today  - likely direct toxicity from sepsis - closely monitor and d/c DVT prophylaxis upon any sign of bleeding  - monitor for DIC - INR, blood oozing from peripheral veins   UTI/nephrolithiasis Status post instrumentation, cystoscopy-stent placement -Status post IV fluid resuscitation -Urine culture positive for E. coli, pansensitive -Continue current IV antibiotics Zosyn switched to p.o. ciprofloxacin   Acute renal failure -  Likely due to sepsis, septic shock--resolved -Creatinine 2.27 >> 1.51 >> 1.26 today -Monitoring BUN/creatinine, improving -Avoiding nephrotoxins -Status post IV fluid hydration   ENDO - In ICU she was hypoglycemic\Hyperglycemia protocol -Resolved   ELECTROLYTES -Was repleted        Cultures; Blood Cultures x 2 >> NGT Urine Culture  >>> E. coli-pansensitive   Antimicrobials: 02/21/2020 IV Zosyn   >>> 02/24/20 02/25/2020 ciprofloxacin 500 mg p.o. twice daily    Consultants: PCCM/ Urology     Nutritional status:          Discharge Instructions:   Discharge Instructions    Activity as tolerated - No restrictions   Complete by: As directed    Diet - low sodium heart healthy   Complete by: As directed    Discharge instructions   Complete by: As directed    Follow-up with urology for further evaluation recommendations and treatment plan   Discharge wound care:   Complete by: As directed    Continue wound care per instructions   Increase activity slowly   Complete by: As directed        Medication List    STOP taking these medications   amLODipine 5 MG tablet Commonly known as: NORVASC   hydrOXYzine 10 MG tablet Commonly known as: ATARAX/VISTARIL   lisinopril 10 MG tablet Commonly known as: ZESTRIL     TAKE these medications   acetaminophen 500 MG tablet Commonly known as: TYLENOL Take 500 mg by mouth every 6 (six) hours as needed for headache.   acidophilus Caps capsule Take 1 capsule by mouth daily for 10 days.   aspirin EC 81 MG tablet Take 1 tablet (81 mg total) by mouth daily.   ciprofloxacin 500 MG tablet Commonly known as: Cipro Take 1 tablet (500 mg total) by mouth 2 (two) times daily for 10 days.   HYDROcodone-acetaminophen 5-325 MG tablet Commonly known as: Norco Take 1 tablet by mouth every 6 (six) hours as needed for up to 5 days for severe pain.   isosorbide mononitrate 30 MG 24 hr tablet Commonly known as: IMDUR Take 1 tablet (30 mg total) by mouth daily.   metoprolol succinate 50 MG 24 hr tablet Commonly known as: TOPROL-XL TAKE 1 TABLET BY MOUTH ONCE DAILY   nitroGLYCERIN 0.4 MG SL tablet Commonly known as: NITROSTAT Place 1 tablet (0.4 mg total) under the tongue every 5 (five) minutes as needed for chest pain. Take for up to 3 doses and then call 911 if still having chest pain.   ondansetron 4 MG tablet Commonly known  as: Zofran Take 1 tablet (4 mg total) by mouth every 8 (eight) hours as needed for up to 10 doses for nausea or vomiting.   rosuvastatin 40 MG tablet Commonly known as: CRESTOR TAKE 1 TABLET BY MOUTH ONCE DAILY   tamsulosin 0.4 MG Caps capsule Commonly known as: Flomax Take 1 capsule (0.4 mg total) by mouth daily for 5 days.       Allergies  Allergen Reactions  . Ferrous Sulfate [Ferrous Fumarate] Other (See Comments)    STOMACH ISSUES  . Lipitor [Atorvastatin] Other (See Comments)    myalgia     Procedures /Studies:   DG Abdomen 1 View  Result Date: 02/20/2020 CLINICAL DATA:  Left flank pain EXAM: ABDOMEN - 1 VIEW COMPARISON:  CT 02/19/2020 FINDINGS: The bowel gas pattern is normal. Excreted contrast within the left renal collecting system with mild left hydroureteronephrosis. Previously seen stone near the left UVJ is  not clearly seen radiographically. There is a small amount of excreted contrast within the bladder. IMPRESSION: Mild left hydroureteronephrosis. Previously seen stone near the left UVJ is not clearly seen radiographically. Electronically Signed   By: Davina Poke D.O.   On: 02/20/2020 11:10   CT ABDOMEN PELVIS W CONTRAST  Result Date: 02/19/2020 CLINICAL DATA:  LEFT flank pain radiating to LEFT groin with nausea and vomiting since Friday, tea-colored urine with strong smell EXAM: CT ABDOMEN AND PELVIS WITH CONTRAST TECHNIQUE: Multidetector CT imaging of the abdomen and pelvis was performed using the standard protocol following bolus administration of intravenous contrast. Sagittal and coronal MPR images reconstructed from axial data set. CONTRAST:  44mL OMNIPAQUE IOHEXOL 300 MG/ML SOLN IV. No oral contrast. COMPARISON:  07/26/2014 FINDINGS: Lower chest: Lung bases clear Hepatobiliary: Gallbladder surgically absent. Liver normal appearance Pancreas: Normal appearance Spleen: Normal appearance Adrenals/Urinary Tract: Adrenal glands normal appearance. Enhancing mass at  superior pole RIGHT kidney measuring 4.2 x 3.5 x 3.8 cm, imaging features most consistent with a renal neoplasm. LEFT hydronephrosis and hydroureter with delay in LEFT nephrogram due to a 5 mm distal LEFT ureteral calculus very near the ureterovesical junction. No additional renal masses. RIGHT ureter decompressed. Bladder unremarkable. Stomach/Bowel: Scattered colonic diverticulosis without evidence of diverticulitis. Appendix surgically absent by history. Stomach decompressed. Bowel loops otherwise unremarkable. Vascular/Lymphatic: Atherosclerotic calcifications aorta, iliac arteries, coronary arteries. Aorta normal caliber. No adenopathy. Scattered pelvic phleboliths. Renal veins and IVC patent. Reproductive: Uterus surgically absent.  No definite adnexal masses. Other: Mesh from prior ventral hernia repair. No free air or free fluid. No definite recurrent hernia. Musculoskeletal: Osseous demineralization. IMPRESSION: LEFT hydronephrosis and hydroureter due to a 5 mm distal LEFT ureteral calculus very near the ureterovesical junction. Enhancing 4.2 x 3.5 x 3.8 cm RIGHT upper pole renal mass most consistent with a primary renal neoplasm. Colonic diverticulosis without evidence of diverticulitis. Prior mesh ventral hernia repair without obvious recurrence. Aortic Atherosclerosis (ICD10-I70.0). Findings called to Dr. Tamala Julian on 02/19/2020 at 1408 hours. Electronically Signed   By: Lavonia Dana M.D.   On: 02/19/2020 14:09   DG Chest Port 1 View  Result Date: 02/21/2020 CLINICAL DATA:  Pulmonary edema EXAM: PORTABLE CHEST 1 VIEW COMPARISON:  02/20/2020 FINDINGS: Postoperative changes in the mediastinum. Shallow inspiration. Cardiac enlargement with pulmonary vascular congestion and mild interstitial edema. Similar appearance to previous study. No pleural effusions. No pneumothorax. Calcification of the aorta. IMPRESSION: Cardiac enlargement with pulmonary vascular congestion and interstitial edema similar to previous  study. Electronically Signed   By: Lucienne Capers M.D.   On: 02/21/2020 00:25   DG Chest Port 1 View  Result Date: 02/20/2020 CLINICAL DATA:  Abnormal chest x-ray. EXAM: PORTABLE CHEST 1 VIEW COMPARISON:  Radiograph earlier today. FINDINGS: Lower lung volumes from prior exam. Prior median sternotomy. Borderline cardiomegaly likely accentuated by low lung volumes. Aortic atherosclerosis. Increasing streaky opacity at the right lung base. Bronchovascular crowding versus vascular congestion. No significant pleural effusion. No pneumothorax. IMPRESSION: 1. Increasing streaky opacity at the right lung base, may represent atelectasis, pneumonia or aspiration. 2. Lower lung volumes from prior exam. Bronchovascular crowding versus vascular congestion. Electronically Signed   By: Keith Rake M.D.   On: 02/20/2020 17:51   DG Chest Port 1 View  Result Date: 02/20/2020 CLINICAL DATA:  Weakness, hypotension EXAM: PORTABLE CHEST 1 VIEW COMPARISON:  08/16/2018 FINDINGS: Post CABG changes. The heart size and mediastinal contours are within normal limits. Atherosclerotic calcification of the aortic knob. No focal airspace consolidation, pleural  effusion, or pneumothorax. The visualized skeletal structures are unremarkable. IMPRESSION: No active disease. Electronically Signed   By: Davina Poke D.O.   On: 02/20/2020 11:07   DG OR UROLOGY CYSTO IMAGE ([facility] ONLY)  Result Date: 02/20/2020 There is no interpretation for this exam.  This order is for images obtained during a surgical procedure.  Please See "Surgeries" Tab for more information regarding the procedure.     Subjective:   Patient was seen and examined 02/24/2020, 8:36 AM Patient stable today. No acute distress.  No issues overnight Stable for discharge.  Discharge Exam:    Vitals:   02/23/20 2318 02/24/20 0445 02/24/20 0451 02/24/20 0518  BP: 132/83 (!) 161/81 (!) 145/78   Pulse: 70 65 66   Resp: 20 20    Temp: 98.1 F (36.7 C) 98.6 F  (37 C)    TempSrc: Oral Oral    SpO2: 96% 97% 95%   Weight:    77.3 kg  Height:        General: Pt lying comfortably in bed & appears in no obvious distress. Cardiovascular: S1 & S2 heard, RRR, S1/S2 +. No murmurs, rubs, gallops or clicks. No JVD or pedal edema. Respiratory: Clear to auscultation without wheezing, rhonchi or crackles. No increased work of breathing. Abdominal:  Non-distended, non-tender & soft. No organomegaly or masses appreciated. Normal bowel sounds heard. CNS: Alert and oriented. No focal deficits. Extremities: no edema, no cyanosis    The results of significant diagnostics from this hospitalization (including imaging, microbiology, ancillary and laboratory) are listed below for reference.      Microbiology:   Recent Results (from the past 240 hour(s))  Respiratory Panel by RT PCR (Flu A&B, Covid) - Nasopharyngeal Swab     Status: None   Collection Time: 02/19/20 12:47 PM   Specimen: Nasopharyngeal Swab  Result Value Ref Range Status   SARS Coronavirus 2 by RT PCR NEGATIVE NEGATIVE Final    Comment: (NOTE) SARS-CoV-2 target nucleic acids are NOT DETECTED.  The SARS-CoV-2 RNA is generally detectable in upper respiratoy specimens during the acute phase of infection. The lowest concentration of SARS-CoV-2 viral copies this assay can detect is 131 copies/mL. A negative result does not preclude SARS-Cov-2 infection and should not be used as the sole basis for treatment or other patient management decisions. A negative result may occur with  improper specimen collection/handling, submission of specimen other than nasopharyngeal swab, presence of viral mutation(s) within the areas targeted by this assay, and inadequate number of viral copies (<131 copies/mL). A negative result must be combined with clinical observations, patient history, and epidemiological information. The expected result is Negative.  Fact Sheet for Patients:    PinkCheek.be  Fact Sheet for Healthcare Providers:  GravelBags.it  This test is no t yet approved or cleared by the [place] FDA and  has been authorized for detection and/or diagnosis of SARS-CoV-2 by FDA under an Emergency Use Authorization (EUA). This EUA will remain  in effect (meaning this test can be used) for the duration of the COVID-19 declaration under Section 564(b)(1) of the Act, 21 U.S.C. section 360bbb-3(b)(1), unless the authorization is terminated or revoked sooner.     Influenza A by PCR NEGATIVE NEGATIVE Final   Influenza B by PCR NEGATIVE NEGATIVE Final    Comment: (NOTE) The Xpert Xpress SARS-CoV-2/FLU/RSV assay is intended as an aid in  the diagnosis of influenza from Nasopharyngeal swab specimens and  should not be used as a sole basis for treatment. Nasal  washings and  aspirates are unacceptable for Xpert Xpress SARS-CoV-2/FLU/RSV  testing.  Fact Sheet for Patients: PinkCheek.be  Fact Sheet for Healthcare Providers: GravelBags.it  This test is not yet approved or cleared by the [place] FDA and  has been authorized for detection and/or diagnosis of SARS-CoV-2 by  FDA under an Emergency Use Authorization (EUA). This EUA will remain  in effect (meaning this test can be used) for the duration of the  Covid-19 declaration under Section 564(b)(1) of the Act, 21  U.S.C. section 360bbb-3(b)(1), unless the authorization is  terminated or revoked. Performed at [facility], [address], [place], [place] [address]   Blood Culture (routine x 2)     Status: None (Preliminary result)   Collection Time: 02/20/20 11:31 AM   Specimen: BLOOD  Result Value Ref Range Status   Specimen Description BLOOD LEFT ANTECUBITAL  Final   Special Requests   Final    BOTTLES DRAWN AEROBIC AND ANAEROBIC Blood Culture adequate volume   Culture    Final    NO GROWTH 4 DAYS Performed at [facility], [address] [place], [place] [address]    Report Status PENDING  Incomplete  Urine culture     Status: Abnormal   Collection Time: 02/20/20 11:31 AM   Specimen: Urine, Random  Result Value Ref Range Status   Specimen Description   Final    URINE, RANDOM Performed at [facility], [address], [place], [place] [address]    Special Requests   Final    NONE Performed at [facility], [address] [place], [place] [address]    Culture >=100,000 COLONIES/mL ESCHERICHIA COLI (A)  Final   Report Status 02/22/2020 FINAL  Final   Organism ID, Bacteria ESCHERICHIA COLI (A)  Final      Susceptibility   Escherichia coli - MIC*    AMPICILLIN 4 SENSITIVE Sensitive     CEFAZOLIN <=4 SENSITIVE Sensitive     CEFTRIAXONE <=0.25 SENSITIVE Sensitive     CIPROFLOXACIN <=0.25 SENSITIVE Sensitive     GENTAMICIN <=1 SENSITIVE Sensitive     IMIPENEM <=0.25 SENSITIVE Sensitive     NITROFURANTOIN <=16 SENSITIVE Sensitive     TRIMETH/SULFA <=20 SENSITIVE Sensitive     AMPICILLIN/SULBACTAM <=2 SENSITIVE Sensitive     PIP/TAZO <=4 SENSITIVE Sensitive     * >=100,000 COLONIES/mL ESCHERICHIA COLI  Blood Culture (routine x 2)     Status: None (Preliminary result)   Collection Time: 02/20/20 11:33 AM   Specimen: BLOOD  Result Value Ref Range Status   Specimen Description BLOOD BLOOD RIGHT HAND  Final   Special Requests   Final    BOTTLES DRAWN AEROBIC AND ANAEROBIC Blood Culture results may not be optimal due to an inadequate volume of blood received in culture bottles   Culture   Final    NO GROWTH 4 DAYS Performed at [facility], [address] [place], [place] [address]    Report Status PENDING  Incomplete  Urine culture     Status: None   Collection Time: 02/20/20  5:25 PM   Specimen: Urine, Catheterized  Result Value Ref Range Status   Specimen Description   Final    URINE,  CATHETERIZED Performed at [facility], [address] [place], [place] [address]    Special Requests   Final    NONE Performed at [facility], [address], [place], [place] [address]    Culture   Final  NO GROWTH Performed at [facility], [place] [address], [place], [place] [address]    Report Status 02/21/2020 FINAL  Final     Labs:   CBC: Recent Labs  Lab 02/20/20 1131 02/21/20 0624 02/22/20 0943 02/23/20 0413 02/24/20 0456  WBC 19.7* 21.0* 26.8* 20.1* 11.1*  NEUTROABS 17.6*  --  20.3*  --   --   HGB 13.4 12.3 12.1 11.3* 12.3  HCT 38.8 36.1 34.6* 34.6* 35.3*  MCV 92.2 93.5 90.6 95.6 90.3  PLT 116* 90* 96* 97* 95*   Basic Metabolic Panel: Recent Labs  Lab 02/20/20 1131 02/20/20 1131 02/20/20 2031 02/21/20 0624 02/22/20 0943 02/23/20 0413 02/24/20 0456  NA 135  --   --  139 142 141 138  K 3.2*   < > 4.1 4.5 3.9 3.7 3.3*  CL 101  --   --  106 107 109 105  CO2 20*  --   --  23 22 25 25   GLUCOSE 90  --   --  126* 138* 81 111*  BUN 46*  --   --  43* 47* 37* 19  CREATININE 3.25*  --   --  2.27* 1.51* 1.26* 1.02*  CALCIUM 8.0*  --   --  7.8* 8.1* 7.9* 8.0*  MG  --   --  1.5* 2.9*  --   --   --   PHOS  --   --   --  5.1*  --   --   --    < > = values in this interval not displayed.   Liver Function Tests: Recent Labs  Lab 02/19/20 0615 02/20/20 1131 02/22/20 0943  AST 17 106* 33  ALT 13 76* 37  ALKPHOS 42 80 71  BILITOT 1.0 1.3* 0.7  PROT 6.8 6.0* 5.5*  ALBUMIN 3.6 3.2* 2.5*   BNP (last 3 results) No results for input(s): BNP in the last 8760 hours. Cardiac Enzymes: Recent Labs  Lab 02/20/20 1131  CKTOTAL 82   CBG: No results for input(s): GLUCAP in the last 168 hours. Hgb A1c No results for input(s): HGBA1C in the last 72 hours. Lipid Profile No results for input(s): CHOL, HDL, LDLCALC, TRIG, CHOLHDL, LDLDIRECT in the last 72 hours. Thyroid function studies No results for input(s): TSH, T4TOTAL, T3FREE,  THYROIDAB in the last 72 hours.  Invalid input(s): FREET3 Anemia work up No results for input(s): VITAMINB12, FOLATE, FERRITIN, TIBC, IRON, RETICCTPCT in the last 72 hours. Urinalysis    Component Value Date/Time   COLORURINE YELLOW (A) 02/20/2020 1725   APPEARANCEUR CLOUDY (A) 02/20/2020 1725   APPEARANCEUR Clear 06/05/2014 0803   LABSPEC 1.010 02/20/2020 1725   LABSPEC 1.012 06/05/2014 0803   PHURINE 5.0 02/20/2020 1725   GLUCOSEU NEGATIVE 02/20/2020 1725   GLUCOSEU Negative 06/05/2014 0803   HGBUR LARGE (A) 02/20/2020 1725   HGBUR negative 01/17/2007 1504   BILIRUBINUR NEGATIVE 02/20/2020 1725   BILIRUBINUR Negative 06/05/2014 0803   KETONESUR NEGATIVE 02/20/2020 1725   PROTEINUR 30 (A) 02/20/2020 1725   UROBILINOGEN 0.2 03/07/2015 2045   NITRITE NEGATIVE 02/20/2020 1725   LEUKOCYTESUR NEGATIVE 02/20/2020 1725   LEUKOCYTESUR Negative 06/05/2014 0803         Time coordinating discharge: Over 45 minutes  SIGNED: Deatra James, MD, FACP, FHM. [facility],  Please use [email or web address] to Page If 7PM-7AM, please contact night-coverage [email or web address] [email or web address] [facility] 02/24/2020, 8:36 AM

## 2020-02-25 LAB — CULTURE, BLOOD (ROUTINE X 2)
Culture: NO GROWTH
Culture: NO GROWTH
Special Requests: ADEQUATE

## 2020-02-27 ENCOUNTER — Other Ambulatory Visit: Payer: Self-pay | Admitting: *Deleted

## 2020-02-27 ENCOUNTER — Telehealth: Payer: Self-pay

## 2020-02-27 DIAGNOSIS — N2889 Other specified disorders of kidney and ureter: Secondary | ICD-10-CM

## 2020-02-27 DIAGNOSIS — D509 Iron deficiency anemia, unspecified: Secondary | ICD-10-CM

## 2020-02-27 NOTE — Telephone Encounter (Signed)
First failed attempt to reach patient for transitional care management. No answer. Left message requesting call back to the office for scheduling hospital follow up. Will follow as appropriate.

## 2020-02-28 NOTE — Telephone Encounter (Signed)
Second attempt to reach patient for transition of care. No answer. No voicemail.

## 2020-02-29 NOTE — Progress Notes (Deleted)
[facility]  [phone]) [phone] Fax:(336) [phone]  ID: Debbie Delacruz OB: 12/25/1945  MR#: [id number]  NKN#:[id number]  Patient Care Team: [name], MD as PCP - General (Family Medicine) [name], MD (Internal Medicine) [name], MD as Consulting Physician (Cardiothoracic Surgery) [name], MD as Consulting Physician (Cardiology) Lloyd Huger, MD as Consulting Physician (Oncology)   CHIEF COMPLAINT: Iron deficiency anemia.  INTERVAL HISTORY: Patient returns to clinic today for repeat laboratory work, further evaluation, and consideration of IV Feraheme.  She continues to feel well and remains asymptomatic.  She does not complain of any weakness or fatigue.  She has no neurologic complaints. She denies any recent fevers or illnesses. She has a good appetite and denies weight loss.  She denies any chest pain, shortness of breath, cough, or hemoptysis.  She denies any nausea, vomiting, constipation, or diarrhea. She denies any melena or hematochezia. She has no urinary complaints.  Patient feels at her baseline and offers no specific complaints today.  REVIEW OF SYSTEMS:   Review of Systems  Constitutional: Negative.  Negative for fever, malaise/fatigue and weight loss.  Respiratory: Negative.  Negative for cough and shortness of breath.   Cardiovascular: Negative.  Negative for chest pain and leg swelling.  Gastrointestinal: Negative.  Negative for abdominal pain, blood in stool and melena.  Genitourinary: Negative.  Negative for hematuria.  Musculoskeletal: Negative.  Negative for joint pain.  Skin: Negative.  Negative for rash.  Neurological: Negative.  Negative for dizziness, sensory change, focal weakness, weakness and headaches.  Psychiatric/Behavioral: Negative.  The patient is not nervous/anxious and does not have insomnia.     As per HPI. Otherwise, a complete review of systems is negative.  PAST MEDICAL  HISTORY: Past Medical History:  Diagnosis Date  . Anemia   . Anginal pain ([place])   . Anxiety   . Arthritis   . CAD (coronary artery disease)   . Carpal tunnel syndrome    bilateral  . Cataract    bil removed cataracts  . Coronary artery disease    2x stents, Dr. Clayborn Bigness  . Depression   . Dysrhythmia   . Family history of adverse reaction to anesthesia    "brother; S/P lipotripsy in [place]; transferred to [facility]; had to put him on life support for 2 days"  . GERD (gastroesophageal reflux disease)   . Headache    "weekly" (09/13/2014)  . History of blood transfusion 05/2014   "we haven't figured out why I needed it yet" (09/13/2014)  . History of hiatal hernia   . Hyperglycemia   . Hyperlipidemia   . Hypertension   . Myocardial infarct ([place])   . Myocardial infarct, old 1999   15 years ago  . Obesity   . PONV (postoperative nausea and vomiting)   . S/P CABG x 4 09/18/2014   LIMA to LAD, SVG to PDA-dRCA sequentially, SVG to OM, EVH via right thigh   . Sleep apnea   . Sleep apnea    "don't use a mask" (09/13/2014)  . Unstable angina ([place])     PAST SURGICAL HISTORY: Past Surgical History:  Procedure Laterality Date  . ABDOMINAL HYSTERECTOMY    . APPENDECTOMY    . BACK SURGERY    . BLADDER SURGERY    . CARDIAC CATHETERIZATION  06/2014  . CARDIAC CATHETERIZATION N/A 06/16/2016   Procedure: Left Heart Cath and Cors/Grafts Angiography;  Surgeon: Burnell Blanks, MD;  Location: [facility];  Service: Cardiovascular;  Laterality: N/A;  . CARPAL TUNNEL RELEASE Left   . CATARACT EXTRACTION W/ INTRAOCULAR LENS  IMPLANT, BILATERAL    . CHOLECYSTECTOMY    . COLONOSCOPY    . CORONARY ANGIOPLASTY WITH STENT PLACEMENT  1999   [facility] x2 stent  . CORONARY ARTERY BYPASS GRAFT N/A 09/18/2014   Procedure: CORONARY ARTERY BYPASS GRAFTING (CABG)times four using LIMA  to LAD:SVG to  PD and DIST RCA;SVG to OM;, EVH Right thigh;  Surgeon: [name], MD;  Location: [facility];  Service: Open Heart Surgery;  Laterality: N/A;  . CYSTOSCOPY WITH STENT PLACEMENT Left 02/20/2020   Procedure: CYSTOSCOPY WITH STENT PLACEMENT;  Surgeon: Billey Co, MD;  Location: [facility];  Service: Urology;  Laterality: Left;  . ESOPHAGOGASTRODUODENOSCOPY N/A 09/17/2014   Procedure: ESOPHAGOGASTRODUODENOSCOPY (EGD);  Surgeon: Inda Castle, MD;  Location: [facility];  Service: Endoscopy;  Laterality: N/A;  . heart stents     1999  . INCONTINENCE SURGERY    . INGUINAL HERNIA REPAIR    . INGUINAL HERNIA REPAIR Right X 3   "last one was in the 1990's; still have hernia there now" (09/13/2014)  . LAPAROSCOPIC CHOLECYSTECTOMY    . LUMBAR DISC SURGERY  1990's?  . LUMBAR SPINE SURGERY    . removal of ovaries    . TEE WITHOUT CARDIOVERSION N/A 09/18/2014   Procedure: TRANSESOPHAGEAL ECHOCARDIOGRAM (TEE);  Surgeon: [name], MD;  Location: [facility];  Service: Open Heart Surgery;  Laterality: N/A;  . UPPER GASTROINTESTINAL ENDOSCOPY      FAMILY HISTORY: Family History  Problem Relation Age of Onset  . Coronary artery disease Mother   . Osteoporosis Mother   . Heart disease Mother   . Stroke Mother   . Heart attack Mother   . Hypertension Mother   . Hypertension Father   . Coronary artery disease Father   . COPD Father   . Heart disease Father   . Heart attack Father   . Glaucoma Father   . Osteoporosis Father   . Emphysema Father   . Cancer Sister 75       colon  . Hypertension Sister   . Colon cancer Sister        dx in her 46's  . Breast cancer Sister   . Colon polyps Sister   . Stroke Brother   . Hypertension Brother   . Colon polyps Brother   . Stroke Maternal Grandfather   . Stroke Maternal Grandmother   . Osteoporosis Maternal Grandmother   . Hypertension Maternal Grandmother   . Hypertension Paternal Grandmother   . Hypertension Paternal Grandfather   . Breast cancer Maternal Aunt        2 aunts-50's  . Esophageal cancer Neg Hx   . Stomach cancer  Neg Hx   . Rectal cancer Neg Hx   . Pancreatic cancer Neg Hx     ADVANCED DIRECTIVES (Y/N):  N  HEALTH MAINTENANCE: Social History   Tobacco Use  . Smoking status: Never Smoker  . Smokeless tobacco: Never Used  Vaping Use  . Vaping Use: Never used  Substance Use Topics  . Alcohol use: Not Currently    Alcohol/week: 0.0 standard drinks  . Drug use: No     Colonoscopy:  PAP:  Bone density:  Lipid panel:  Allergies  Allergen Reactions  . Ferrous Sulfate [Ferrous Fumarate] Other (See Comments)    STOMACH ISSUES  . Lipitor [Atorvastatin] Other (See Comments)  myalgia    Current Outpatient Medications  Medication Sig Dispense Refill  . acetaminophen (TYLENOL) 500 MG tablet Take 500 mg by mouth every 6 (six) hours as needed for headache.    Marland Kitchen acidophilus (RISAQUAD) CAPS capsule Take 1 capsule by mouth daily for 10 days. 10 capsule 0  . amLODipine (NORVASC) 5 MG tablet Take 1 tablet (5 mg total) by mouth daily. 90 tablet 3  . aspirin EC 81 MG tablet Take 1 tablet (81 mg total) by mouth daily.    . ciprofloxacin (CIPRO) 500 MG tablet Take 1 tablet (500 mg total) by mouth 2 (two) times daily for 10 days. 20 tablet 0  . isosorbide mononitrate (IMDUR) 30 MG 24 hr tablet Take 1 tablet (30 mg total) by mouth daily. 90 tablet 1  . lisinopril (ZESTRIL) 10 MG tablet TAKE 1 TABLET BY MOUTH ONCE DAILY. PLEASE CALL TO SCHEDULE APPOINTMENT FOR FUTURE REFILLS 90 tablet 1  . metoprolol succinate (TOPROL-XL) 50 MG 24 hr tablet TAKE 1 TABLET BY MOUTH ONCE DAILY 30 tablet 3  . nitroGLYCERIN (NITROSTAT) 0.4 MG SL tablet Place 1 tablet (0.4 mg total) under the tongue every 5 (five) minutes as needed for chest pain. Take for up to 3 doses and then call 911 if still having chest pain. 50 tablet 3  . ondansetron (ZOFRAN) 4 MG tablet Take 1 tablet (4 mg total) by mouth every 8 (eight) hours as needed for up to 10 doses for nausea or vomiting. 10 tablet 0  . rosuvastatin (CRESTOR) 40 MG tablet TAKE 1  TABLET BY MOUTH ONCE DAILY 90 tablet 1   No current facility-administered medications for this visit.    OBJECTIVE: There were no vitals filed for this visit.   There is no height or weight on file to calculate BMI.    ECOG FS:0 - Asymptomatic  General: Well-developed, well-nourished, no acute distress. Eyes: Pink conjunctiva, anicteric sclera. HEENT: Normocephalic, moist mucous membranes. Lungs: No audible wheezing or coughing. Heart: Regular rate and rhythm. Abdomen: Soft, nontender, no obvious distention. Musculoskeletal: No edema, cyanosis, or clubbing. Neuro: Alert, answering all questions appropriately. Cranial nerves grossly intact. Skin: No rashes or petechiae noted. Psych: Normal affect.  LAB RESULTS:  Lab Results  Component Value Date   NA 138 02/24/2020   K 3.3 (L) 02/24/2020   CL 105 02/24/2020   CO2 25 02/24/2020   GLUCOSE 111 (H) 02/24/2020   BUN 19 02/24/2020   CREATININE 1.02 (H) 02/24/2020   CALCIUM 8.0 (L) 02/24/2020   PROT 5.5 (L) 02/22/2020   ALBUMIN 2.5 (L) 02/22/2020   AST 33 02/22/2020   ALT 37 02/22/2020   ALKPHOS 71 02/22/2020   BILITOT 0.7 02/22/2020   GFRNONAA 55 (L) 02/24/2020   GFRAA >60 02/24/2020    Lab Results  Component Value Date   WBC 11.1 (H) 02/24/2020   NEUTROABS 20.3 (H) 02/22/2020   HGB 12.3 02/24/2020   HCT 35.3 (L) 02/24/2020   MCV 90.3 02/24/2020   PLT 95 (L) 02/24/2020   Lab Results  Component Value Date   IRON 86 11/24/2019   TIBC 391 11/24/2019   IRONPCTSAT 22 11/24/2019   Lab Results  Component Value Date   FERRITIN 28 11/24/2019     STUDIES: DG Abdomen 1 View  Result Date: 02/20/2020 CLINICAL DATA:  Left flank pain EXAM: ABDOMEN - 1 VIEW COMPARISON:  CT 02/19/2020 FINDINGS: The bowel gas pattern is normal. Excreted contrast within the left renal collecting system with mild left hydroureteronephrosis. Previously  seen stone near the left UVJ is not clearly seen radiographically. There is a small amount of  excreted contrast within the bladder. IMPRESSION: Mild left hydroureteronephrosis. Previously seen stone near the left UVJ is not clearly seen radiographically. Electronically Signed   By: Davina Poke D.O.   On: 02/20/2020 11:10   CT ABDOMEN PELVIS W CONTRAST  Result Date: 02/19/2020 CLINICAL DATA:  LEFT flank pain radiating to LEFT groin with nausea and vomiting since Friday, tea-colored urine with strong smell EXAM: CT ABDOMEN AND PELVIS WITH CONTRAST TECHNIQUE: Multidetector CT imaging of the abdomen and pelvis was performed using the standard protocol following bolus administration of intravenous contrast. Sagittal and coronal MPR images reconstructed from axial data set. CONTRAST:  79mL OMNIPAQUE IOHEXOL 300 MG/ML SOLN IV. No oral contrast. COMPARISON:  07/26/2014 FINDINGS: Lower chest: Lung bases clear Hepatobiliary: Gallbladder surgically absent. Liver normal appearance Pancreas: Normal appearance Spleen: Normal appearance Adrenals/Urinary Tract: Adrenal glands normal appearance. Enhancing mass at superior pole RIGHT kidney measuring 4.2 x 3.5 x 3.8 cm, imaging features most consistent with a renal neoplasm. LEFT hydronephrosis and hydroureter with delay in LEFT nephrogram due to a 5 mm distal LEFT ureteral calculus very near the ureterovesical junction. No additional renal masses. RIGHT ureter decompressed. Bladder unremarkable. Stomach/Bowel: Scattered colonic diverticulosis without evidence of diverticulitis. Appendix surgically absent by history. Stomach decompressed. Bowel loops otherwise unremarkable. Vascular/Lymphatic: Atherosclerotic calcifications aorta, iliac arteries, coronary arteries. Aorta normal caliber. No adenopathy. Scattered pelvic phleboliths. Renal veins and IVC patent. Reproductive: Uterus surgically absent.  No definite adnexal masses. Other: Mesh from prior ventral hernia repair. No free air or free fluid. No definite recurrent hernia. Musculoskeletal: Osseous  demineralization. IMPRESSION: LEFT hydronephrosis and hydroureter due to a 5 mm distal LEFT ureteral calculus very near the ureterovesical junction. Enhancing 4.2 x 3.5 x 3.8 cm RIGHT upper pole renal mass most consistent with a primary renal neoplasm. Colonic diverticulosis without evidence of diverticulitis. Prior mesh ventral hernia repair without obvious recurrence. Aortic Atherosclerosis (ICD10-I70.0). Findings called to Dr. Tamala Julian on 02/19/2020 at 1408 hours. Electronically Signed   By: Lavonia Dana M.D.   On: 02/19/2020 14:09   DG Chest Port 1 View  Result Date: 02/21/2020 CLINICAL DATA:  Pulmonary edema EXAM: PORTABLE CHEST 1 VIEW COMPARISON:  02/20/2020 FINDINGS: Postoperative changes in the mediastinum. Shallow inspiration. Cardiac enlargement with pulmonary vascular congestion and mild interstitial edema. Similar appearance to previous study. No pleural effusions. No pneumothorax. Calcification of the aorta. IMPRESSION: Cardiac enlargement with pulmonary vascular congestion and interstitial edema similar to previous study. Electronically Signed   By: Lucienne Capers M.D.   On: 02/21/2020 00:25   DG Chest Port 1 View  Result Date: 02/20/2020 CLINICAL DATA:  Abnormal chest x-ray. EXAM: PORTABLE CHEST 1 VIEW COMPARISON:  Radiograph earlier today. FINDINGS: Lower lung volumes from prior exam. Prior median sternotomy. Borderline cardiomegaly likely accentuated by low lung volumes. Aortic atherosclerosis. Increasing streaky opacity at the right lung base. Bronchovascular crowding versus vascular congestion. No significant pleural effusion. No pneumothorax. IMPRESSION: 1. Increasing streaky opacity at the right lung base, may represent atelectasis, pneumonia or aspiration. 2. Lower lung volumes from prior exam. Bronchovascular crowding versus vascular congestion. Electronically Signed   By: Keith Rake M.D.   On: 02/20/2020 17:51   DG Chest Port 1 View  Result Date: 02/20/2020 CLINICAL DATA:   Weakness, hypotension EXAM: PORTABLE CHEST 1 VIEW COMPARISON:  08/16/2018 FINDINGS: Post CABG changes. The heart size and mediastinal contours are within normal limits. Atherosclerotic calcification of  the aortic knob. No focal airspace consolidation, pleural effusion, or pneumothorax. The visualized skeletal structures are unremarkable. IMPRESSION: No active disease. Electronically Signed   By: Davina Poke D.O.   On: 02/20/2020 11:07   DG OR UROLOGY CYSTO IMAGE ([facility] ONLY)  Result Date: 02/20/2020 There is no interpretation for this exam.  This order is for images obtained during a surgical procedure.  Please See "Surgeries" Tab for more information regarding the procedure.    ASSESSMENT: Iron deficiency anemia.  PLAN:    1. Iron deficiency anemia: Patient's hemoglobin and iron stores continue to be within normal limits. Previously, she reported she cannot tolerate oral iron supplementation. The remainder of her laboratory work is either negative or within normal limits.  Patient had a normal endoscopy in May 2017 and a normal colonoscopy in August 2014.  Patient will likely require repeat colonoscopy in the near future.  She does not require additional IV Feraheme today.  Patient last received IV Feraheme on May 08, 2019.  If she does not require treatment at her next visit, can consider discharging patient from clinic. 2.  Hypertension: Chronic and unchanged.  Patient's blood pressure remains significantly elevated.  Continue evaluation and treatment per primary care.   Patient expressed understanding and was in agreement with this plan. She also understands that She can call clinic at any time with any questions, concerns, or complaints.    Lloyd Huger, MD   02/29/2020 2:00 PM

## 2020-03-05 ENCOUNTER — Telehealth: Payer: Self-pay | Admitting: Oncology

## 2020-03-05 ENCOUNTER — Inpatient Hospital Stay: Payer: Medicare Other | Admitting: Oncology

## 2020-03-05 ENCOUNTER — Inpatient Hospital Stay: Payer: Medicare Other

## 2020-03-05 NOTE — Telephone Encounter (Signed)
Pt called to reschedule missed appt on 10/21. Instructed pt to contact office ASAP as Dr. Grayland Ormond would like to see the patient next week prior to her surgery. Will attempt to reach patient again tomorrow if call not returned.

## 2020-03-06 ENCOUNTER — Other Ambulatory Visit: Payer: Self-pay | Admitting: Urology

## 2020-03-06 DIAGNOSIS — N201 Calculus of ureter: Secondary | ICD-10-CM

## 2020-03-11 ENCOUNTER — Other Ambulatory Visit: Payer: Self-pay

## 2020-03-11 DIAGNOSIS — N201 Calculus of ureter: Secondary | ICD-10-CM

## 2020-03-12 ENCOUNTER — Ambulatory Visit (INDEPENDENT_AMBULATORY_CARE_PROVIDER_SITE_OTHER): Payer: Medicare Other | Admitting: Urology

## 2020-03-12 ENCOUNTER — Other Ambulatory Visit
Admission: RE | Admit: 2020-03-12 | Discharge: 2020-03-12 | Disposition: A | Discharge status: Home / Self Care | Payer: Medicare Other | Attending: Urology | Admitting: Urology

## 2020-03-12 ENCOUNTER — Encounter: Payer: Self-pay | Admitting: Urology

## 2020-03-12 ENCOUNTER — Other Ambulatory Visit: Payer: Self-pay

## 2020-03-12 ENCOUNTER — Encounter
Admission: RE | Admit: 2020-03-12 | Discharge: 2020-03-12 | Disposition: A | Discharge status: Home / Self Care | Payer: Medicare Other | Source: Ambulatory Visit | Attending: Urology | Admitting: Urology

## 2020-03-12 VITALS — BP 168/93 | HR 86 | Ht 62.0 in | Wt 176.0 lb

## 2020-03-12 DIAGNOSIS — N201 Calculus of ureter: Secondary | ICD-10-CM

## 2020-03-12 DIAGNOSIS — C649 Malignant neoplasm of unspecified kidney, except renal pelvis: Secondary | ICD-10-CM | POA: Diagnosis not present

## 2020-03-12 DIAGNOSIS — C641 Malignant neoplasm of right kidney, except renal pelvis: Secondary | ICD-10-CM | POA: Diagnosis not present

## 2020-03-12 DIAGNOSIS — B379 Candidiasis, unspecified: Secondary | ICD-10-CM

## 2020-03-12 DIAGNOSIS — N2889 Other specified disorders of kidney and ureter: Secondary | ICD-10-CM | POA: Diagnosis not present

## 2020-03-12 DIAGNOSIS — N200 Calculus of kidney: Secondary | ICD-10-CM

## 2020-03-12 HISTORY — DX: Other specified disorders of kidney and ureter: N28.89

## 2020-03-12 HISTORY — DX: Unspecified atrial fibrillation: I48.91

## 2020-03-12 LAB — URINALYSIS, COMPLETE (UACMP) WITH MICROSCOPIC
Bacteria, UA: NONE SEEN
RBC / HPF: >50 RBC/hpf (ref 0–5)
WBC, UA: NONE SEEN WBC/hpf (ref 0–5)

## 2020-03-12 MED ORDER — FLUCONAZOLE 100 MG PO TABS: 100.0000 mg | ORAL_TABLET | Freq: Every day | ORAL | 0 refills | Status: DC

## 2020-03-12 NOTE — H&P (View-Only) (Signed)
   03/12/2020 2:16 PM   Debbie Delacruz Feb 23, 1946 [id number]  Reason for visit: Follow up left ureteral stone with sepsis, right 4cm renal mass  HPI: I saw Debbie Delacruz in urology clinic today for follow-up of the above issues.  Briefly, she is a 74 year old female who was admitted with severe sepsis from urinary source on 02/20/2020 secondary to a 4 mm left distal ureteral stone.  She underwent emergent stent placement at that time, and cultures ultimately grew E. coli.  She improved over the next few days and was ultimately discharged home.  CT at that time also showed a incidental finding of a 4 cm right upper pole enhancing renal mass concerning for renal cell carcinoma, with no evidence of metastatic disease.  She has been doing well since discharge, and denies any complaints today.  She has had some mild hematuria with the stent in place but otherwise denies any fevers or chills.  Urinalysis today with yeast but no other evidence of infection, sent for culture.  Will start fluconazole prior to ureteroscopy this Friday for definitive management of her stone.  We reviewed the need for ureteroscopy/laser lithotripsy/stent exchange this Friday for management of her left distal ureteral stone. We specifically discussed the risks ureteroscopy including bleeding, infection/sepsis, stent related symptoms including flank pain/urgency/frequency/incontinence/dysuria, ureteral injury, inability to access stone, or need for staged or additional procedures.  We also discussed the need for management of her right-sided upper pole renal mass that likely represents a renal cell carcinoma with either laparoscopic radical nephrectomy or robotic partial nephrectomy.  We also discussed the need to complete staging with a CT chest, and this was ordered today.  My plan would be to treat her left-sided distal ureteral stone this week, and have her follow-up renal ultrasound in 3 weeks to confirm no evidence of  silent hydronephrosis prior to managing her right-sided renal mass.  CT chest to complete staging for right 4 cm renal mass Fluconazole for yeast in urine Left ureteroscopy/laser lithotripsy/stent exchange this Friday for 4 mm stone  Billey Co, MD  [facility] [address], [place], [place] [address] [phone]

## 2020-03-12 NOTE — Patient Instructions (Signed)
Ureteral Stent Implantation  Ureteral stent implantation is a procedure to insert (implant) a flexible, soft, plastic tube (stent) into a ureter. Ureters are the tube-like parts of the body that drain urine from the kidneys. The stent supports the ureter while it heals and helps to drain urine. You may have a ureteral stent implanted after having a procedure to remove a blockage from the ureter (ureterolysis or pyeloplasty). You may also have a stent implanted to open the flow of urine when you have a blockage caused by a kidney stone, tumor, blood clot, or infection. You have two ureters, one on each side of the body. The ureters connect the kidneys to the organ that holds urine until it passes out of the body (bladder). The stent is placed so that one end is in the kidney, and one end is in the bladder. The stent is usually taken out after your ureter has healed. Depending on your condition, you may have a stent for just a few weeks, or you may have a long-term stent that will need to be replaced every few months. Tell a health care provider about:  Any allergies you have.  All medicines you are taking, including vitamins, herbs, eye drops, creams, and over-the-counter medicines.  Any problems you or family members have had with anesthetic medicines.  Any blood disorders you have.  Any surgeries you have had.  Any medical conditions you have.  Whether you are pregnant or may be pregnant. What are the risks? Generally, this is a safe procedure. However, problems may occur, including:  Infection.  Bleeding.  Allergic reactions to medicines.  Damage to other structures or organs. Tearing (perforation) of the ureter is possible.  Movement of the stent away from where it is placed during surgery (migration). What happens before the procedure? Medicines Ask your health care provider about:  Changing or stopping your regular medicines. This is especially important if you are taking  diabetes medicines or blood thinners.  Taking medicines such as aspirin and ibuprofen. These medicines can thin your blood. Do not take these medicines unless your health care provider tells you to take them.  Taking over-the-counter medicines, vitamins, herbs, and supplements. Eating and drinking Follow instructions from your health care provider about eating and drinking, which may include:  8 hours before the procedure - stop eating heavy meals or foods, such as meat, fried foods, or fatty foods.  6 hours before the procedure - stop eating light meals or foods, such as toast or cereal.  6 hours before the procedure - stop drinking milk or drinks that contain milk.  2 hours before the procedure - stop drinking clear liquids. Staying hydrated Follow instructions from your health care provider about hydration, which may include:  Up to 2 hours before the procedure - you may continue to drink clear liquids, such as water, clear fruit juice, black coffee, and plain tea. General instructions  Do not drink alcohol.  Do not use any products that contain nicotine or tobacco for at least 4 weeks before the procedure. These products include cigarettes, e-cigarettes, and chewing tobacco. If you need help quitting, ask your health care provider.  You may have an exam or testing, such as imaging or blood tests.  Ask your health care provider what steps will be taken to help prevent infection. These may include: ? Removing hair at the surgery site. ? Washing skin with a germ-killing soap. ? Taking antibiotic medicine.  Plan to have someone take you home   from the hospital or clinic.  If you will be going home right after the procedure, plan to have someone with you for 24 hours. What happens during the procedure?  An IV will be inserted into one of your veins.  You may be given a medicine to help you relax (sedative).  You may be given a medicine to make you fall asleep (general  anesthetic).  A thin, tube-shaped instrument with a light and tiny camera at the end (cystoscope) will be inserted into your urethra. The urethra is the tube that drains urine from the bladder out of the body. In men, the urethra opens at the end of the penis. In women, the urethra opens in front of the vaginal opening.  The cystoscope will be passed into your bladder.  A thin wire (guide wire) will be passed through your bladder and into your ureter. This is used to guide the stent into your ureter.  The stent will be inserted into your ureter.  The guide wire and the cystoscope will be removed.  A flexible tube (catheter) may be inserted through your urethra so that one end is in your bladder. This helps to drain urine from your bladder. The procedure may vary among hospitals and health care providers. What happens after the procedure?  Your blood pressure, heart rate, breathing rate, and blood oxygen level will be monitored until you leave the hospital or clinic.  You may continue to receive medicine and fluids through an IV.  You may have some soreness or pain in your abdomen and urethra. Medicines will be available to help you.  You will be encouraged to get up and walk around as soon as you can.  You may have a catheter draining your urine.  You will have some blood in your urine.  Do not drive for 24 hours if you were given a sedative during your procedure. Summary  Ureteral stent implantation is a procedure to insert a flexible, soft, plastic tube (stent) into a ureter.  You may have a stent implanted to support the ureter while it heals after a procedure or to open the flow of urine if there is a blockage.  Follow instructions from your health care provider about taking medicines and about eating and drinking before the procedure.  Depending on your condition, you may have a stent for just a few weeks, or you may have a long-term stent that will need to be replaced every  few months. This information is not intended to replace advice given to you by your health care provider. Make sure you discuss any questions you have with your health care provider. Document Revised: 02/15/2018 Document Reviewed: 02/16/2018 Elsevier Patient Education  2020 [employer].

## 2020-03-12 NOTE — Progress Notes (Signed)
   03/12/2020 2:16 PM   [name] [birth year] [id number]  Reason for visit: Follow up left ureteral stone with sepsis, right 4cm renal mass  HPI: I saw [name] in urology clinic today for follow-up of the above issues.  Briefly, she is a 74 year old female who was admitted with severe sepsis from urinary source on 02/20/2020 secondary to a 4 mm left distal ureteral stone.  She underwent emergent stent placement at that time, and cultures ultimately grew E. coli.  She improved over the next few days and was ultimately discharged home.  CT at that time also showed a incidental finding of a 4 cm right upper pole enhancing renal mass concerning for renal cell carcinoma, with no evidence of metastatic disease.  She has been doing well since discharge, and denies any complaints today.  She has had some mild hematuria with the stent in place but otherwise denies any fevers or chills.  Urinalysis today with yeast but no other evidence of infection, sent for culture.  Will start fluconazole prior to ureteroscopy this Friday for definitive management of her stone.  We reviewed the need for ureteroscopy/laser lithotripsy/stent exchange this Friday for management of her left distal ureteral stone. We specifically discussed the risks ureteroscopy including bleeding, infection/sepsis, stent related symptoms including flank pain/urgency/frequency/incontinence/dysuria, ureteral injury, inability to access stone, or need for staged or additional procedures.  We also discussed the need for management of her right-sided upper pole renal mass that likely represents a renal cell carcinoma with either laparoscopic radical nephrectomy or robotic partial nephrectomy.  We also discussed the need to complete staging with a CT chest, and this was ordered today.  My plan would be to treat her left-sided distal ureteral stone this week, and have her follow-up renal ultrasound in 3 weeks to confirm no evidence of  silent hydronephrosis prior to managing her right-sided renal mass.  CT chest to complete staging for right 4 cm renal mass Fluconazole for yeast in urine Left ureteroscopy/laser lithotripsy/stent exchange this Friday for 4 mm stone  Billey Co, MD  [facility] [address] [place], [place] [address] [phone]

## 2020-03-12 NOTE — Patient Instructions (Addendum)
Your procedure is scheduled on:03-15-20 FRIDAY Report to Day Surgery on the 2nd floor of the [facility]. To find out your arrival time, please call [phone] between 1PM - 3PM on:03-14-20 THURSDAY  REMEMBER: Instructions that are not followed completely may result in serious medical risk, up to and including death; or upon the discretion of your surgeon and anesthesiologist your surgery may need to be rescheduled.  Do not eat food after midnight the night before surgery.  No gum chewing, lozengers or hard candies.  You may however, drink CLEAR liquids up to 2 hours before you are scheduled to arrive for your surgery. Do not drink anything within 2 hours of your scheduled arrival time.  Clear liquids include: - water  - apple juice without pulp - gatorade (not RED, PURPLE, OR BLUE) - black coffee or tea (Do NOT add milk or creamers to the coffee or tea) Do NOT drink anything that is not on this list.  TAKE THESE MEDICATIONS THE MORNING OF SURGERY WITH A SIP OF WATER: -IMDUR (ISOSORBIDE MONONITRATE) -CRESTOR (ROSUVASTATIN)  Follow recommendations from Cardiologist, Pulmonologist or PCP regarding stopping Aspirin, Coumadin, Plavix, Eliquis, Pradaxa, or Pletal-CALL DR SNINKSY'S OFFICE AND ASK ABOUT WHEN TO STOP YOUR ASPIRIN  One week prior to surgery: Stop Anti-inflammatories (NSAIDS) such as Advil, Aleve, Ibuprofen, Motrin, Naproxen, Naprosyn and Aspirin based products such as Excedrin, Goodys Powder, BC Powder-OK TO TAKE TYLENOL IF NEEDED  Stop ANY OVER THE COUNTER supplements until after surgery.   No Alcohol for 24 hours before or after surgery.  No Smoking including e-cigarettes for 24 hours prior to surgery.  No chewable tobacco products for at least 6 hours prior to surgery.  No nicotine patches on the day of surgery.  Do not use any "recreational" drugs for at least a week prior to your surgery.  Please be advised that the combination of cocaine and anesthesia may  have negative outcomes, up to and including death. If you test positive for cocaine, your surgery will be cancelled.  On the morning of surgery brush your teeth with toothpaste and water, you may rinse your mouth with mouthwash if you wish. Do not swallow any toothpaste or mouthwash.  Do not wear jewelry, make-up, hairpins, clips or nail polish.  Do not wear lotions, powders, or perfumes.   Do not shave 48 hours prior to surgery.   Contact lenses, hearing aids and dentures may not be worn into surgery.  Do not bring valuables to the hospital. [facility] is not responsible for any missing/lost belongings or valuables.  Notify your doctor if there is any change in your medical condition (cold, fever, infection).  Wear comfortable clothing (specific to your surgery type) to the hospital.  Plan for stool softeners for home use; pain medications have a tendency to cause constipation. You can also help prevent constipation by eating foods high in fiber such as fruits and vegetables and drinking plenty of fluids as your diet allows.  After surgery, you can help prevent lung complications by doing breathing exercises.  Take deep breaths and cough every 1-2 hours. Your doctor may order a device called an Incentive Spirometer to help you take deep breaths. When coughing or sneezing, hold a pillow firmly against your incision with both hands. This is called splinting. Doing this helps protect your incision. It also decreases belly discomfort.  If you are being admitted to the hospital overnight, leave your suitcase in the car. After surgery it may be brought to your room.  If you are being discharged the day of surgery, you will not be allowed to drive home. You will need a responsible adult (18 years or older) to drive you home and stay with you that night.   If you are taking public transportation, you will need to have a responsible adult (18 years or older) with you. Please confirm with  your physician that it is acceptable to use public transportation.   Please call the [facility] at [phone] if you have any questions about these instructions.  Visitation Policy:  Patients undergoing a surgery or procedure may have one family member or support person with them as long as that person is not COVID-19 positive or experiencing its symptoms.  That person may remain in the waiting area during the procedure.  Inpatient Visitation Update:   In an effort to ensure the safety of our team members and our patients, we are implementing a change to our visitation policy:  Effective Monday, Aug. 9, at 7 a.m., inpatients will be allowed one support person.  o The support person may change daily.  o The support person must pass our screening, gel in and out, and wear a mask at all times, including in the patients room.  o Patients must also wear a mask when staff or their support person are in the room.  o Masking is required regardless of vaccination status.  Systemwide, no visitors 17 or younger.

## 2020-03-13 ENCOUNTER — Encounter
Admission: RE | Admit: 2020-03-13 | Discharge: 2020-03-13 | Disposition: A | Discharge status: Home / Self Care | Payer: Medicare Other | Source: Ambulatory Visit | Attending: Urology | Admitting: Urology

## 2020-03-13 ENCOUNTER — Other Ambulatory Visit: Admission: RE | Admit: 2020-03-13 | Payer: Medicare Other | Source: Ambulatory Visit

## 2020-03-13 DIAGNOSIS — I4891 Unspecified atrial fibrillation: Secondary | ICD-10-CM | POA: Diagnosis not present

## 2020-03-13 DIAGNOSIS — Z01812 Encounter for preprocedural laboratory examination: Secondary | ICD-10-CM | POA: Insufficient documentation

## 2020-03-13 DIAGNOSIS — I10 Essential (primary) hypertension: Secondary | ICD-10-CM | POA: Insufficient documentation

## 2020-03-13 DIAGNOSIS — Z20822 Contact with and (suspected) exposure to covid-19: Secondary | ICD-10-CM | POA: Diagnosis not present

## 2020-03-13 LAB — SARS CORONAVIRUS 2 (TAT 6-24 HRS): SARS Coronavirus 2: NEGATIVE

## 2020-03-13 LAB — POTASSIUM: Potassium: 4.2 mmol/L (ref 3.5–5.1)

## 2020-03-14 LAB — URINE CULTURE

## 2020-03-14 MED ORDER — CEFAZOLIN SODIUM-DEXTROSE 2-4 GM/100ML-% IV SOLN
2.0000 g | INTRAVENOUS | Status: AC
  Administered 2020-03-15: 2 g via INTRAVENOUS

## 2020-03-14 MED ORDER — ORAL CARE MOUTH RINSE: 15.0000 mL | Freq: Once | OROMUCOSAL | Status: AC

## 2020-03-14 MED ORDER — LACTATED RINGERS IV SOLN: INTRAVENOUS | Status: DC

## 2020-03-14 MED ORDER — CHLORHEXIDINE GLUCONATE 0.12 % MT SOLN: 15.0000 mL | Freq: Once | OROMUCOSAL | Status: AC

## 2020-03-14 MED ORDER — FAMOTIDINE 20 MG PO TABS: 20.0000 mg | ORAL_TABLET | Freq: Once | ORAL | Status: AC

## 2020-03-15 ENCOUNTER — Encounter: Payer: Self-pay | Admitting: Urology

## 2020-03-15 ENCOUNTER — Ambulatory Visit: Payer: Medicare Other | Admitting: Anesthesiology

## 2020-03-15 ENCOUNTER — Other Ambulatory Visit: Payer: Self-pay

## 2020-03-15 ENCOUNTER — Ambulatory Visit
Admission: RE | Admit: 2020-03-15 | Discharge: 2020-03-15 | Disposition: A | Discharge status: Home / Self Care | Payer: Medicare Other | Attending: Urology | Admitting: Urology

## 2020-03-15 ENCOUNTER — Ambulatory Visit: Payer: Medicare Other

## 2020-03-15 ENCOUNTER — Encounter
Admission: RE | Disposition: A | Discharge status: Home / Self Care | Payer: Self-pay | Source: Home / Self Care | Attending: Urology

## 2020-03-15 DIAGNOSIS — N201 Calculus of ureter: Secondary | ICD-10-CM

## 2020-03-15 DIAGNOSIS — Z951 Presence of aortocoronary bypass graft: Secondary | ICD-10-CM | POA: Diagnosis not present

## 2020-03-15 DIAGNOSIS — G4730 Sleep apnea, unspecified: Secondary | ICD-10-CM | POA: Insufficient documentation

## 2020-03-15 DIAGNOSIS — I110 Hypertensive heart disease with heart failure: Secondary | ICD-10-CM | POA: Diagnosis not present

## 2020-03-15 DIAGNOSIS — I5032 Chronic diastolic (congestive) heart failure: Secondary | ICD-10-CM | POA: Insufficient documentation

## 2020-03-15 DIAGNOSIS — I2510 Atherosclerotic heart disease of native coronary artery without angina pectoris: Secondary | ICD-10-CM | POA: Insufficient documentation

## 2020-03-15 DIAGNOSIS — Z955 Presence of coronary angioplasty implant and graft: Secondary | ICD-10-CM | POA: Diagnosis not present

## 2020-03-15 HISTORY — PX: CYSTOSCOPY W/ RETROGRADES: SHX1426

## 2020-03-15 HISTORY — PX: CYSTOSCOPY/URETEROSCOPY/HOLMIUM LASER/STENT PLACEMENT: SHX6546

## 2020-03-15 SURGERY — CYSTOSCOPY/URETEROSCOPY/HOLMIUM LASER/STENT PLACEMENT
Anesthesia: General | Laterality: Left

## 2020-03-15 MED ORDER — FENTANYL CITRATE (PF) 100 MCG/2ML IJ SOLN: 25.0000 ug | INTRAMUSCULAR | Status: DC | PRN

## 2020-03-15 MED ORDER — FENTANYL CITRATE (PF) 100 MCG/2ML IJ SOLN
INTRAMUSCULAR | Status: DC | PRN
  Administered 2020-03-15: 50 ug via INTRAVENOUS

## 2020-03-15 MED ORDER — FENTANYL CITRATE (PF) 100 MCG/2ML IJ SOLN
INTRAMUSCULAR | Status: AC
  Filled 2020-03-15: qty 2

## 2020-03-15 MED ORDER — PHENYLEPHRINE HCL (PRESSORS) 10 MG/ML IV SOLN
INTRAVENOUS | Status: DC | PRN
  Administered 2020-03-15 (×2): 100 ug via INTRAVENOUS

## 2020-03-15 MED ORDER — OXYCODONE HCL 5 MG PO TABS: 5.0000 mg | ORAL_TABLET | Freq: Once | ORAL | Status: DC | PRN

## 2020-03-15 MED ORDER — FAMOTIDINE 20 MG PO TABS
ORAL_TABLET | ORAL | Status: AC
  Administered 2020-03-15: 20 mg via ORAL
  Filled 2020-03-15: qty 1

## 2020-03-15 MED ORDER — ONDANSETRON HCL 4 MG/2ML IJ SOLN
INTRAMUSCULAR | Status: DC | PRN
  Administered 2020-03-15: 4 mg via INTRAVENOUS

## 2020-03-15 MED ORDER — PROPOFOL 10 MG/ML IV BOLUS
INTRAVENOUS | Status: DC | PRN
  Administered 2020-03-15: 30 mg via INTRAVENOUS
  Administered 2020-03-15: 100 mg via INTRAVENOUS

## 2020-03-15 MED ORDER — IOHEXOL 180 MG/ML  SOLN
INTRAMUSCULAR | Status: DC | PRN
  Administered 2020-03-15: 20 mL

## 2020-03-15 MED ORDER — CHLORHEXIDINE GLUCONATE 0.12 % MT SOLN
OROMUCOSAL | Status: AC
  Administered 2020-03-15: 15 mL via OROMUCOSAL
  Filled 2020-03-15: qty 15

## 2020-03-15 MED ORDER — LIDOCAINE HCL (CARDIAC) PF 100 MG/5ML IV SOSY
PREFILLED_SYRINGE | INTRAVENOUS | Status: DC | PRN
  Administered 2020-03-15: 80 mg via INTRAVENOUS

## 2020-03-15 MED ORDER — ONDANSETRON HCL 4 MG/2ML IJ SOLN: 4.0000 mg | Freq: Once | INTRAMUSCULAR | Status: DC | PRN

## 2020-03-15 MED ORDER — DEXAMETHASONE SODIUM PHOSPHATE 10 MG/ML IJ SOLN
INTRAMUSCULAR | Status: DC | PRN
  Administered 2020-03-15: 10 mg via INTRAVENOUS

## 2020-03-15 MED ORDER — OXYCODONE HCL 5 MG/5ML PO SOLN: 5.0000 mg | Freq: Once | ORAL | Status: DC | PRN

## 2020-03-15 MED ORDER — CEFAZOLIN SODIUM-DEXTROSE 2-4 GM/100ML-% IV SOLN
INTRAVENOUS | Status: AC
  Filled 2020-03-15: qty 100

## 2020-03-15 SURGICAL SUPPLY — 31 items
BAG DRAIN CYSTO-URO LG1000N (MISCELLANEOUS) ×3 IMPLANT
BRUSH SCRUB EZ 1% IODOPHOR (MISCELLANEOUS) ×3 IMPLANT
CATH URETL 5X70 OPEN END (CATHETERS) IMPLANT
CNTNR SPEC 2.5X3XGRAD LEK (MISCELLANEOUS)
CONT SPEC 4OZ STER OR WHT (MISCELLANEOUS)
CONT SPEC 4OZ STRL OR WHT (MISCELLANEOUS)
CONTAINER SPEC 2.5X3XGRAD LEK (MISCELLANEOUS) IMPLANT
DRAPE UTILITY 15X26 TOWEL STRL (DRAPES) ×3 IMPLANT
FIBER LASER TRAC TIP (UROLOGICAL SUPPLIES) IMPLANT
GLOVE BIOGEL PI IND STRL 7.5 (GLOVE) ×1 IMPLANT
GLOVE BIOGEL PI INDICATOR 7.5 (GLOVE) ×2
GOWN STRL REUS W/ TWL LRG LVL3 (GOWN DISPOSABLE) ×1 IMPLANT
GOWN STRL REUS W/ TWL XL LVL3 (GOWN DISPOSABLE) ×1 IMPLANT
GOWN STRL REUS W/TWL LRG LVL3 (GOWN DISPOSABLE) ×3
GOWN STRL REUS W/TWL XL LVL3 (GOWN DISPOSABLE) ×3
GUIDEWIRE STR DUAL SENSOR (WIRE) ×3 IMPLANT
INFUSOR MANOMETER BAG 3000ML (MISCELLANEOUS) ×3 IMPLANT
INTRODUCER DILATOR DOUBLE (INTRODUCER) IMPLANT
KIT TURNOVER CYSTO (KITS) ×3 IMPLANT
PACK CYSTO AR (MISCELLANEOUS) ×3 IMPLANT
SET CYSTO W/LG BORE CLAMP LF (SET/KITS/TRAYS/PACK) ×3 IMPLANT
SHEATH URETERAL 12FRX35CM (MISCELLANEOUS) IMPLANT
SOL .9 NS 3000ML IRR  AL (IV SOLUTION) ×3
SOL .9 NS 3000ML IRR AL (IV SOLUTION) ×1
SOL .9 NS 3000ML IRR UROMATIC (IV SOLUTION) ×1 IMPLANT
STENT URET 6FRX24 CONTOUR (STENTS) IMPLANT
STENT URET 6FRX26 CONTOUR (STENTS) IMPLANT
SURGILUBE 2OZ TUBE FLIPTOP (MISCELLANEOUS) ×3 IMPLANT
SYR 10ML LL (SYRINGE) ×3 IMPLANT
VALVE UROSEAL ADJ ENDO (VALVE) ×3 IMPLANT
WATER STERILE IRR 1000ML POUR (IV SOLUTION) ×3 IMPLANT

## 2020-03-15 NOTE — Anesthesia Preprocedure Evaluation (Addendum)
Anesthesia Evaluation  Patient identified by MRN, date of birth, ID band Patient awake    Reviewed: Allergy & Precautions, H&P , NPO status , Patient's Chart, lab work & pertinent test results  History of Anesthesia Complications Negative for: history of anesthetic complications  Airway Mallampati: I  TM Distance: >3 FB Neck ROM: full    Dental  (+) Teeth Intact   Pulmonary sleep apnea , neg COPD,    breath sounds clear to auscultation       Cardiovascular Exercise Tolerance: Good hypertension, (-) angina+ CAD, + Past MI, + Cardiac Stents, + CABG and +CHF (HFpEF)  + dysrhythmias Atrial Fibrillation  Rhythm:regular Rate:Normal  Echo 2018: - Left ventricle: The cavity size was normal. Wall thickness was  increased in a pattern of mild LVH. Systolic function was normal.  The estimated ejection fraction was in the range of 55% to 60%.  There is hypokinesis of the basalinferior myocardium. Doppler  parameters are consistent with abnormal left ventricular  relaxation (grade 1 diastolic dysfunction).  - Mitral valve: Calcified annulus.  - Left atrium: The atrium was mildly dilated.  - Pulmonary arteries: Systolic pressure was mildly increased. PA  peak pressure: 36 mm Hg (S).    Neuro/Psych  Headaches, PSYCHIATRIC DISORDERS Anxiety Depression    GI/Hepatic Neg liver ROS, GERD  Controlled,  Endo/Other  negative endocrine ROS  Renal/GU Renal stone     Musculoskeletal   Abdominal   Peds  Hematology negative hematology ROS (+)   Anesthesia Other Findings Past Medical History: No date: A-fib ([place]) No date: Anemia No date: Anginal pain ([place]) No date: Anxiety No date: Arthritis No date: CAD (coronary artery disease) No date: Carpal tunnel syndrome     Comment:  bilateral No date: Cataract     Comment:  bil removed cataracts No date: Coronary artery disease     Comment:  2x stents, Dr. Clayborn Bigness No date:  Depression No date: Dysrhythmia No date: Family history of adverse reaction to anesthesia     Comment:  "brother; S/P lipotripsy in [place]; transferred               to [facility]; had to put him on life support for 2               days" No date: GERD (gastroesophageal reflux disease) No date: Headache     Comment:  "weekly" (09/13/2014) 05/2014: History of blood transfusion     Comment:  "we haven't figured out why I needed it yet" (09/13/2014) No date: History of hiatal hernia No date: Hyperglycemia No date: Hyperlipidemia No date: Hypertension No date: Kidney stone No date: Myocardial infarct [facility]) 1999: Myocardial infarct, old     Comment:  15 years ago No date: Obesity No date: PONV (postoperative nausea and vomiting) No date: Renal mass 09/18/2014: S/P CABG x 4     Comment:  LIMA to LAD, SVG to PDA-dRCA sequentially, SVG to OM,               EVH via right thigh  No date: Sleep apnea No date: Sleep apnea     Comment:  "don't use a mask" (09/13/2014) No date: Unstable angina ([place])  Past Surgical History: No date: ABDOMINAL HYSTERECTOMY No date: APPENDECTOMY No date: BACK SURGERY No date: BLADDER SURGERY 06/2014: CARDIAC CATHETERIZATION 06/16/2016: CARDIAC CATHETERIZATION; N/A     Comment:  Procedure: Left Heart Cath and Cors/Grafts Angiography;  Surgeon: Burnell Blanks, MD;  Location: [facility];  Service: Cardiovascular;  Laterality:               N/A; No date: CARPAL TUNNEL RELEASE; Left No date: CATARACT EXTRACTION W/ INTRAOCULAR LENS  IMPLANT, BILATERAL No date: CHOLECYSTECTOMY No date: COLONOSCOPY 1999: CORONARY ANGIOPLASTY WITH STENT PLACEMENT     Comment:  [facility] x2 stent 09/18/2014: CORONARY ARTERY BYPASS GRAFT; N/A     Comment:  Procedure: CORONARY ARTERY BYPASS GRAFTING (CABG)times               four using LIMA  to LAD:SVG to  PD and DIST RCA;SVG to               OM;, EVH Right thigh;  Surgeon: [name],  MD;                Location: [facility];  Service: Open Heart Surgery;                Laterality: N/A; 02/20/2020: CYSTOSCOPY WITH STENT PLACEMENT; Left     Comment:  Procedure: CYSTOSCOPY WITH STENT PLACEMENT;  Surgeon:               Billey Co, MD;  Location: [facility];  Service:               Urology;  Laterality: Left; 09/17/2014: ESOPHAGOGASTRODUODENOSCOPY; N/A     Comment:  Procedure: ESOPHAGOGASTRODUODENOSCOPY (EGD);  Surgeon:               Inda Castle, MD;  Location: [facility];  Service:               Endoscopy;  Laterality: N/A; No date: heart stents     Comment:  1999 No date: INCONTINENCE SURGERY No date: INGUINAL HERNIA REPAIR X 3: INGUINAL HERNIA REPAIR; Right     Comment:  "last one was in the 1990's; still have hernia there               now" (09/13/2014) No date: LAPAROSCOPIC CHOLECYSTECTOMY 1990's?: LUMBAR DISC SURGERY No date: LUMBAR SPINE SURGERY No date: removal of ovaries 09/18/2014: TEE WITHOUT CARDIOVERSION; N/A     Comment:  Procedure: TRANSESOPHAGEAL ECHOCARDIOGRAM (TEE);                Surgeon: [name], MD;  Location: [facility];  Service:              Open Heart Surgery;  Laterality: N/A; No date: UPPER GASTROINTESTINAL ENDOSCOPY  BMI    Body Mass Index: 30.73 kg/m      Reproductive/Obstetrics negative OB ROS                          Anesthesia Physical Anesthesia Plan  ASA: III  Anesthesia Plan: General LMA   Post-op Pain Management:    Induction:   PONV Risk Score and Plan: Ondansetron and Dexamethasone  Airway Management Planned:   Additional Equipment:   Intra-op Plan:   Post-operative Plan:   Informed Consent: I have reviewed the patients History and Physical, chart, labs and discussed the procedure including the risks, benefits and alternatives for the proposed anesthesia with the patient or authorized representative who has indicated his/her understanding and acceptance.     Dental Advisory  Given  Plan Discussed with: Anesthesiologist, CRNA and Surgeon  Anesthesia  Plan Comments:        Anesthesia Quick Evaluation

## 2020-03-15 NOTE — Op Note (Signed)
Date of procedure: 03/15/20  Preoperative diagnosis:  1. Left distal ureteral stone  Postoperative diagnosis:  1. Spontaneously passed left distal ureteral stone  Procedure: 1. Cystoscopy, left diagnostic ureteroscopy, left retrograde pyelogram with intraoperative interpretation  Surgeon: Nickolas Madrid, MD  Anesthesia: General  Complications: None  Intraoperative findings:  1.  Normal cystoscopy 2.  No stone seen on left diagnostic ureteroscopy with semirigid and flexible scope 3.  Excellent drainage of contrast and no stent placed  EBL: None  Specimens: None  Drains: None  Indication: Debbie Delacruz is a 74 y.o. patient who previously presented with sepsis secondary to urinary source and a 5 mm left distal ureteral stone and underwent emergent stent placement.  She also had an incidental finding of a 4.5 cm right upper pole enhancing renal mass.  After reviewing the management options for treatment, they elected to proceed with the above surgical procedure(s). We have discussed the potential benefits and risks of the procedure, side effects of the proposed treatment, the likelihood of the patient achieving the goals of the procedure, and any potential problems that might occur during the procedure or recuperation. Informed consent has been obtained.  Description of procedure:  The patient was taken to the operating room and general anesthesia was induced. SCDs were placed for DVT prophylaxis. The patient was placed in the dorsal lithotomy position, prepped and draped in the usual sterile fashion, and preoperative antibiotics were administered. A preoperative time-out was performed.   A 21 French rigid cystoscope was used to intubate the urethra and thorough cystoscopy was performed.  The bladder was grossly normal.  The left ureteral stent was grasped and pulled to the meatus, and a sensor wire was advanced up into the kidney under fluoroscopic vision, and the old stent  removed.  A semirigid ureteroscope was advanced alongside the wire and a normal-appearing ureter was followed proximally up to the kidney.  There were no stones or abnormalities seen.  I then advanced a single channel digital flexible ureteroscope over the wire and thorough pyeloscopy revealed no stones or other abnormalities.  Contrast was injected and showed no filling defects or extravasation.  Careful pullback ureteroscopy demonstrated no ureteral stones.  The rigid cystoscope was reinserted into the bladder and there was brisk efflux of clear urine from the left ureter and on fluoroscopy contrast had completely drained.  No stent was placed.  The bladder was drained and this concluded our procedure  Disposition: Stable to PACU  Plan: Follow-up in 2 weeks for renal ultrasound to confirm no evidence of silent hydronephrosis CT chest to complete staging for right renal mass Discuss consideration of radical versus partial nephrectomy at 2-week follow-up  Nickolas Madrid, MD

## 2020-03-15 NOTE — Discharge Instructions (Signed)

## 2020-03-15 NOTE — Anesthesia Procedure Notes (Signed)
Procedure Name: LMA Insertion Date/Time: 03/15/2020 4:00 PM Performed by: Nelda Marseille, CRNA Pre-anesthesia Checklist: Patient identified, Patient being monitored, Timeout performed, Emergency Drugs available and Suction available Patient Re-evaluated:Patient Re-evaluated prior to induction Oxygen Delivery Method: Circle system utilized Preoxygenation: Pre-oxygenation with 100% oxygen Induction Type: IV induction Ventilation: Mask ventilation without difficulty LMA: LMA inserted LMA Size: 4.0 Tube type: Oral Number of attempts: 1 Placement Confirmation: positive ETCO2 and breath sounds checked- equal and bilateral Tube secured with: Tape Dental Injury: Teeth and Oropharynx as per pre-operative assessment

## 2020-03-15 NOTE — Transfer of Care (Signed)
Immediate Anesthesia Transfer of Care Note  Patient: Debbie Delacruz  Procedure(s) Performed: CYSTOSCOPY/URETEROSCOPY (Left ) CYSTOSCOPY WITH RETROGRADE PYELOGRAM (Left )  Patient Location: PACU  Anesthesia Type:General  Level of Consciousness: awake and sedated  Airway & Oxygen Therapy: Patient Spontanous Breathing and Patient connected to face mask oxygen  Post-op Assessment: Report given to RN and Post -op Vital signs reviewed and stable  Post vital signs: Reviewed and stable  Last Vitals:  Vitals Value Taken Time  BP    Temp    Pulse    Resp    SpO2      Last Pain:  Vitals:   03/15/20 1259  TempSrc: Temporal  PainSc: 0-No pain         Complications: No complications documented.

## 2020-03-15 NOTE — Interval H&P Note (Signed)
UROLOGY H&P UPDATE  Agree with prior H&P dated 03/12/2020.  Previously emergently stented for sepsis with a left distal ureteral stone.  She also has a right 4 cm enhancing renal mass worrisome for renal cell carcinoma, CT chest pending.  Will manage left distal ureteral stone and confirm no silent hydro on follow-up ultrasound prior to addressing right-sided RCC.  Cardiac: RRR Lungs: CTA bilaterally  Laterality: Left Procedure: Left ureteroscopy, laser lithotripsy, stent placement  Urine: Urinalysis 10/19 was 0 WBCs and no bacteria, yeast present and was treated with fluconazole  We specifically discussed the risks ureteroscopy including bleeding, infection/sepsis, stent related symptoms including flank pain/urgency/frequency/incontinence/dysuria, ureteral injury, inability to access stone, or need for staged or additional procedures.   Billey Co, MD 03/15/2020

## 2020-03-18 ENCOUNTER — Encounter: Payer: Self-pay | Admitting: Urology

## 2020-03-18 NOTE — Anesthesia Postprocedure Evaluation (Signed)
Anesthesia Post Note  Patient: [name]  Procedure(s) Performed: CYSTOSCOPY/URETEROSCOPY (Left ) CYSTOSCOPY WITH RETROGRADE PYELOGRAM (Left )  Patient location during evaluation: PACU Anesthesia Type: General Level of consciousness: awake and alert Pain management: pain level controlled Vital Signs Assessment: post-procedure vital signs reviewed and stable Respiratory status: spontaneous breathing, nonlabored ventilation and respiratory function stable Cardiovascular status: blood pressure returned to baseline and stable Postop Assessment: no apparent nausea or vomiting Anesthetic complications: no   No complications documented.   Last Vitals:  Vitals:   03/15/20 1652 03/15/20 1706  BP: (!) 171/94 (!) 155/100  Pulse: 81 88  Resp: (!) 23 16  Temp: (!) 36.4 C   SpO2: 97% 97%    Last Pain:  Vitals:   03/15/20 1644  TempSrc:   PainSc: 0-No pain                 Brett Canales Huel [place]

## 2020-03-21 ENCOUNTER — Telehealth: Payer: Self-pay | Admitting: Urology

## 2020-03-21 NOTE — Telephone Encounter (Signed)
S/W pt, appt made and pt transferred to scheduling.

## 2020-03-25 ENCOUNTER — Encounter: Payer: Self-pay | Admitting: Family Medicine

## 2020-03-26 ENCOUNTER — Telehealth: Payer: Self-pay | Admitting: Family Medicine

## 2020-03-26 NOTE — Telephone Encounter (Signed)
Patient called office to see what she should do about her covid booster/see MyChart message. Patient is scheduled for her covid booster at 6pm today. Patient would like a phone call from office.

## 2020-03-29 ENCOUNTER — Inpatient Hospital Stay: Payer: Medicare Other

## 2020-04-01 ENCOUNTER — Ambulatory Visit
Admission: RE | Admit: 2020-04-01 | Discharge: 2020-04-01 | Disposition: A | Discharge status: Home / Self Care | Payer: Medicare Other | Source: Ambulatory Visit | Attending: Urology | Admitting: Urology

## 2020-04-01 ENCOUNTER — Other Ambulatory Visit: Payer: Self-pay

## 2020-04-01 ENCOUNTER — Ambulatory Visit: Payer: Medicare Other

## 2020-04-01 ENCOUNTER — Ambulatory Visit: Payer: Medicare Other | Admitting: Oncology

## 2020-04-01 DIAGNOSIS — C641 Malignant neoplasm of right kidney, except renal pelvis: Secondary | ICD-10-CM

## 2020-04-01 DIAGNOSIS — N201 Calculus of ureter: Secondary | ICD-10-CM | POA: Diagnosis present

## 2020-04-01 DIAGNOSIS — N2889 Other specified disorders of kidney and ureter: Secondary | ICD-10-CM

## 2020-04-01 HISTORY — DX: Malignant (primary) neoplasm, unspecified: C80.1

## 2020-04-01 LAB — POCT I-STAT CREATININE: Creatinine, Ser: 0.8 mg/dL (ref 0.44–1.00)

## 2020-04-01 MED ORDER — IOHEXOL 300 MG/ML  SOLN
75.0000 mL | Freq: Once | INTRAMUSCULAR | Status: AC | PRN
  Administered 2020-04-01: 75 mL via INTRAVENOUS

## 2020-04-02 ENCOUNTER — Encounter: Payer: Medicare Other | Admitting: Gastroenterology

## 2020-04-04 ENCOUNTER — Ambulatory Visit: Payer: Medicare Other | Admitting: Urology

## 2020-04-06 NOTE — Progress Notes (Deleted)
[place]  [phone] [phone]  ID: [name] OB: 1945/10/01  MR#: [id number]  STM#:[id number]  Patient Care Team: [name], MD as PCP - General (Family Medicine) [name], MD (Internal Medicine) [name], MD as Consulting Physician (Cardiothoracic Surgery) [name], MD as Consulting Physician (Cardiology) Lloyd Huger, MD as Consulting Physician (Oncology)   CHIEF COMPLAINT: Iron deficiency anemia.  INTERVAL HISTORY: Patient returns to clinic today for repeat laboratory work, further evaluation, and consideration of IV Feraheme.  She continues to feel well and remains asymptomatic.  She does not complain of any weakness or fatigue.  She has no neurologic complaints. She denies any recent fevers or illnesses. She has a good appetite and denies weight loss.  She denies any chest pain, shortness of breath, cough, or hemoptysis.  She denies any nausea, vomiting, constipation, or diarrhea. She denies any melena or hematochezia. She has no urinary complaints.  Patient feels at her baseline and offers no specific complaints today.  REVIEW OF SYSTEMS:   Review of Systems  Constitutional: Negative.  Negative for fever, malaise/fatigue and weight loss.  Respiratory: Negative.  Negative for cough and shortness of breath.   Cardiovascular: Negative.  Negative for chest pain and leg swelling.  Gastrointestinal: Negative.  Negative for abdominal pain, blood in stool and melena.  Genitourinary: Negative.  Negative for hematuria.  Musculoskeletal: Negative.  Negative for joint pain.  Skin: Negative.  Negative for rash.  Neurological: Negative.  Negative for dizziness, sensory change, focal weakness, weakness and headaches.  Psychiatric/Behavioral: Negative.  The patient is not nervous/anxious and does not have insomnia.     As per HPI. Otherwise, a complete review of systems is negative.  PAST MEDICAL  HISTORY: Past Medical History:  Diagnosis Date  . A-fib ([place])   . Anemia   . Anginal pain ([place])   . Anxiety   . Arthritis   . CAD (coronary artery disease)   . Cancer ([place])   . Carpal tunnel syndrome    bilateral  . Cataract    bil removed cataracts  . Coronary artery disease    2x stents, Dr. Clayborn Bigness  . Depression   . Dysrhythmia   . Family history of adverse reaction to anesthesia    "brother; S/P lipotripsy in [place]; transferred to [facility]; had to put him on life support for 2 days"  . GERD (gastroesophageal reflux disease)   . Headache    "weekly" (09/13/2014)  . History of blood transfusion 05/2014   "we haven't figured out why I needed it yet" (09/13/2014)  . History of hiatal hernia   . Hyperglycemia   . Hyperlipidemia   . Hypertension   . Kidney stone   . Myocardial infarct ([place])   . Myocardial infarct, old 1999   15 years ago  . Obesity   . PONV (postoperative nausea and vomiting)   . Renal mass   . S/P CABG x 4 09/18/2014   LIMA to LAD, SVG to PDA-dRCA sequentially, SVG to OM, EVH via right thigh   . Sleep apnea   . Sleep apnea    "don't use a mask" (09/13/2014)  . Unstable angina ([place])     PAST SURGICAL HISTORY: Past Surgical History:  Procedure Laterality Date  . ABDOMINAL HYSTERECTOMY    . APPENDECTOMY    . BACK SURGERY    . BLADDER SURGERY    . CARDIAC CATHETERIZATION  06/2014  . CARDIAC CATHETERIZATION N/A  06/16/2016   Procedure: Left Heart Cath and Cors/Grafts Angiography;  Surgeon: Burnell Blanks, MD;  Location: [facility];  Service: Cardiovascular;  Laterality: N/A;  . CARPAL TUNNEL RELEASE Left   . CATARACT EXTRACTION W/ INTRAOCULAR LENS  IMPLANT, BILATERAL    . CHOLECYSTECTOMY    . COLONOSCOPY    . CORONARY ANGIOPLASTY WITH STENT PLACEMENT  1999   [facility] x2 stent  . CORONARY ARTERY BYPASS GRAFT N/A 09/18/2014   Procedure: CORONARY ARTERY BYPASS GRAFTING (CABG)times four using LIMA  to LAD:SVG to  PD and DIST RCA;SVG  to OM;, EVH Right thigh;  Surgeon: [name], MD;  Location: [facility];  Service: Open Heart Surgery;  Laterality: N/A;  . CYSTOSCOPY W/ RETROGRADES Left 03/15/2020   Procedure: CYSTOSCOPY WITH RETROGRADE PYELOGRAM;  Surgeon: Billey Co, MD;  Location: [facility];  Service: Urology;  Laterality: Left;  . CYSTOSCOPY WITH STENT PLACEMENT Left 02/20/2020   Procedure: CYSTOSCOPY WITH STENT PLACEMENT;  Surgeon: Billey Co, MD;  Location: [facility];  Service: Urology;  Laterality: Left;  . CYSTOSCOPY/URETEROSCOPY/HOLMIUM LASER/STENT PLACEMENT Left 03/15/2020   Procedure: CYSTOSCOPY/URETEROSCOPY;  Surgeon: Billey Co, MD;  Location: [facility];  Service: Urology;  Laterality: Left;  . ESOPHAGOGASTRODUODENOSCOPY N/A 09/17/2014   Procedure: ESOPHAGOGASTRODUODENOSCOPY (EGD);  Surgeon: Inda Castle, MD;  Location: [facility];  Service: Endoscopy;  Laterality: N/A;  . heart stents     1999  . INCONTINENCE SURGERY    . INGUINAL HERNIA REPAIR    . INGUINAL HERNIA REPAIR Right X 3   "last one was in the 1990's; still have hernia there now" (09/13/2014)  . LAPAROSCOPIC CHOLECYSTECTOMY    . LUMBAR DISC SURGERY  1990's?  . LUMBAR SPINE SURGERY    . removal of ovaries    . TEE WITHOUT CARDIOVERSION N/A 09/18/2014   Procedure: TRANSESOPHAGEAL ECHOCARDIOGRAM (TEE);  Surgeon: [name], MD;  Location: [facility];  Service: Open Heart Surgery;  Laterality: N/A;  . UPPER GASTROINTESTINAL ENDOSCOPY      FAMILY HISTORY: Family History  Problem Relation Age of Onset  . Coronary artery disease Mother   . Osteoporosis Mother   . Heart disease Mother   . Stroke Mother   . Heart attack Mother   . Hypertension Mother   . Hypertension Father   . Coronary artery disease Father   . COPD Father   . Heart disease Father   . Heart attack Father   . Glaucoma Father   . Osteoporosis Father   . Emphysema Father   . Cancer Sister 67       colon  . Hypertension Sister   . Colon cancer Sister         dx in her 80's  . Breast cancer Sister   . Colon polyps Sister   . Stroke Brother   . Hypertension Brother   . Colon polyps Brother   . Stroke Maternal Grandfather   . Stroke Maternal Grandmother   . Osteoporosis Maternal Grandmother   . Hypertension Maternal Grandmother   . Hypertension Paternal Grandmother   . Hypertension Paternal Grandfather   . Breast cancer Maternal Aunt        2 aunts-50's  . Esophageal cancer Neg Hx   . Stomach cancer Neg Hx   . Rectal cancer Neg Hx   . Pancreatic cancer Neg Hx     ADVANCED DIRECTIVES (Y/N):  N  HEALTH MAINTENANCE: Social History   Tobacco Use  . Smoking status: Never  Smoker  . Smokeless tobacco: Never Used  Vaping Use  . Vaping Use: Never used  Substance Use Topics  . Alcohol use: Not Currently    Alcohol/week: 0.0 standard drinks  . Drug use: No     Colonoscopy:  PAP:  Bone density:  Lipid panel:  Allergies  Allergen Reactions  . Ferrous Sulfate [Ferrous Fumarate] Other (See Comments)    STOMACH ISSUES  . Lipitor [Atorvastatin] Other (See Comments)    myalgia    Current Outpatient Medications  Medication Sig Dispense Refill  . acetaminophen (TYLENOL) 500 MG tablet Take 500 mg by mouth every 6 (six) hours as needed for headache.    Marland Kitchen amLODipine (NORVASC) 5 MG tablet Take 1 tablet (5 mg total) by mouth daily. (Patient not taking: Reported on 03/08/2020) 90 tablet 3  . aspirin EC 81 MG tablet Take 1 tablet (81 mg total) by mouth daily.    . isosorbide mononitrate (IMDUR) 30 MG 24 hr tablet Take 1 tablet (30 mg total) by mouth daily. (Patient taking differently: Take 30 mg by mouth every morning. ) 90 tablet 1  . lisinopril (ZESTRIL) 10 MG tablet TAKE 1 TABLET BY MOUTH ONCE DAILY. PLEASE CALL TO SCHEDULE APPOINTMENT FOR FUTURE REFILLS (Patient taking differently: Take 10 mg by mouth every morning. ) 90 tablet 1  . metoprolol succinate (TOPROL-XL) 50 MG 24 hr tablet TAKE 1 TABLET BY MOUTH ONCE DAILY 30 tablet 3  .  nitroGLYCERIN (NITROSTAT) 0.4 MG SL tablet Place 1 tablet (0.4 mg total) under the tongue every 5 (five) minutes as needed for chest pain. Take for up to 3 doses and then call 911 if still having chest pain. (Patient not taking: Reported on 03/15/2020) 50 tablet 3  . ondansetron (ZOFRAN) 4 MG tablet Take 1 tablet (4 mg total) by mouth every 8 (eight) hours as needed for up to 10 doses for nausea or vomiting. (Patient not taking: Reported on 03/15/2020) 10 tablet 0  . rosuvastatin (CRESTOR) 40 MG tablet TAKE 1 TABLET BY MOUTH ONCE DAILY (Patient taking differently: Take 40 mg by mouth every morning. ) 90 tablet 1   No current facility-administered medications for this visit.    OBJECTIVE: There were no vitals filed for this visit.   There is no height or weight on file to calculate BMI.    ECOG FS:0 - Asymptomatic  General: Well-developed, well-nourished, no acute distress. Eyes: Pink conjunctiva, anicteric sclera. HEENT: Normocephalic, moist mucous membranes. Lungs: No audible wheezing or coughing. Heart: Regular rate and rhythm. Abdomen: Soft, nontender, no obvious distention. Musculoskeletal: No edema, cyanosis, or clubbing. Neuro: Alert, answering all questions appropriately. Cranial nerves grossly intact. Skin: No rashes or petechiae noted. Psych: Normal affect.  LAB RESULTS:  Lab Results  Component Value Date   NA 138 02/24/2020   K 4.2 03/13/2020   CL 105 02/24/2020   CO2 25 02/24/2020   GLUCOSE 111 (H) 02/24/2020   BUN 19 02/24/2020   CREATININE 0.80 04/01/2020   CALCIUM 8.0 (L) 02/24/2020   PROT 5.5 (L) 02/22/2020   ALBUMIN 2.5 (L) 02/22/2020   AST 33 02/22/2020   ALT 37 02/22/2020   ALKPHOS 71 02/22/2020   BILITOT 0.7 02/22/2020   GFRNONAA 55 (L) 02/24/2020   GFRAA >60 02/24/2020    Lab Results  Component Value Date   WBC 11.1 (H) 02/24/2020   NEUTROABS 20.3 (H) 02/22/2020   HGB 12.3 02/24/2020   HCT 35.3 (L) 02/24/2020   MCV 90.3 02/24/2020   PLT 95 (  L)  02/24/2020   Lab Results  Component Value Date   IRON 86 11/24/2019   TIBC 391 11/24/2019   IRONPCTSAT 22 11/24/2019   Lab Results  Component Value Date   FERRITIN 28 11/24/2019     STUDIES: CT CHEST W CONTRAST  Result Date: 04/02/2020 CLINICAL DATA:  Solid right renal mass compatible with renal cell carcinoma. Assess for metastatic disease. EXAM: CT CHEST WITH CONTRAST TECHNIQUE: Multidetector CT imaging of the chest was performed during intravenous contrast administration. CONTRAST:  65mL OMNIPAQUE IOHEXOL 300 MG/ML  SOLN COMPARISON:  CT abdomen pelvis 02/19/2020, 07/26/2014. Chest x-ray 02/21/2020 FINDINGS: Cardiovascular: Post CABG changes. Normal heart size. No pericardial effusion. Thoracic aorta is normal in course and caliber. Three vessel arch. Atherosclerotic calcification of the aorta and coronary arteries. Pulmonary vasculature is nondilated. No central filling defects. Mediastinum/Nodes: No axillary, mediastinal, or hilar lymphadenopathy. 6 mm low-density right thyroid lobe nodule. Small hiatal hernia. Esophagus otherwise unremarkable. Trachea within normal limits. Lungs/Pleura: 3 mm nodule within the right lower lobe in the azygoesophageal recess (series 3, image 114) unchanged from prior CT 07/26/2014, benign. No additional pulmonary nodules or masses. No focal airspace consolidation. No pleural effusion or pneumothorax. Upper Abdomen: Redemonstration of a heterogeneously enhancing mass emanating from the superior pole of the right kidney measuring approximately 4.0 x 3.6 x 3.8 cm, unchanged from prior. There is reflux of contrast into the IVC and hepatic veins. Remaining visualized upper abdomen is within normal limits. Musculoskeletal: No acute osseous finding. No suspicious bone lesion. No chest wall abnormality. IMPRESSION: 1. No evidence of metastatic disease within the chest. 2. Redemonstration of a 4.0 cm heterogeneously enhancing mass emanating from the superior pole of the  right kidney, consistent with renal cell carcinoma. 3. Reflux of contrast into the IVC and hepatic veins, which can be seen with right heart dysfunction. 4. Aortic atherosclerosis. (ICD10-I70.0). Electronically Signed   By: Davina Poke D.O.   On: 04/02/2020 15:37   US RENAL  Result Date: 04/02/2020 CLINICAL DATA:  Follow-up examination for left ureteral stone, evaluate hydronephrosis. EXAM: RENAL / URINARY TRACT ULTRASOUND COMPLETE COMPARISON:  Prior CT from 02/19/2020. FINDINGS: Right Kidney: Renal measurements: 9.5 x 4.7 x 5.4 cm = volume: 127.4 mL. Renal echogenicity within normal limits. No nephrolithiasis or hydronephrosis. Solid mass positioned at the upper pole measures 3.6 x 3.9 x 3.8 cm, concerning for a primary renal neoplasm. Associated internal vascularity. Left Kidney: Renal measurements: 9.5 x 4.6 x 5.2 cm = volume: 119.4 mL. Renal echogenicity within normal limits. No shadowing echogenic calculi. Previously seen left-sided hydronephrosis has resolved. No focal renal mass. Bladder: Appears normal for degree of bladder distention. Neither ureteral jet visualized. Other: None. IMPRESSION: 1. Interval resolution of previously seen left-sided hydronephrosis. 2. 3.9 cm solid mass at the upper pole of the right kidney, concerning for a primary renal neoplasm/RCC. Finding better seen on recent CT from 02/19/2020. Electronically Signed   By: Jeannine Boga M.D.   On: 04/02/2020 00:36   DG OR UROLOGY CYSTO IMAGE ([facility] ONLY)  Result Date: 03/15/2020 There is no interpretation for this exam.  This order is for images obtained during a surgical procedure.  Please See "Surgeries" Tab for more information regarding the procedure.    ASSESSMENT: Iron deficiency anemia.  PLAN:    1. Iron deficiency anemia: Patient's hemoglobin and iron stores continue to be within normal limits. Previously, she reported she cannot tolerate oral iron supplementation. The remainder of her laboratory work is  either negative or  within normal limits.  Patient had a normal endoscopy in May 2017 and a normal colonoscopy in August 2014.  Patient will likely require repeat colonoscopy in the near future.  She does not require additional IV Feraheme today.  Patient last received IV Feraheme on May 08, 2019.  If she does not require treatment at her next visit, can consider discharging patient from clinic. 2.  Hypertension: Chronic and unchanged.  Patient's blood pressure remains significantly elevated.  Continue evaluation and treatment per primary care.   Patient expressed understanding and was in agreement with this plan. She also understands that She can call clinic at any time with any questions, concerns, or complaints.    Lloyd Huger, MD   04/06/2020 12:19 PM

## 2020-04-09 ENCOUNTER — Inpatient Hospital Stay: Payer: Medicare Other | Attending: Oncology

## 2020-04-09 DIAGNOSIS — N2889 Other specified disorders of kidney and ureter: Secondary | ICD-10-CM

## 2020-04-09 DIAGNOSIS — D509 Iron deficiency anemia, unspecified: Secondary | ICD-10-CM | POA: Diagnosis present

## 2020-04-09 LAB — COMPREHENSIVE METABOLIC PANEL
ALT: 14 U/L (ref 0–44)
AST: 19 U/L (ref 15–41)
Albumin: 3.9 g/dL (ref 3.5–5.0)
Alkaline Phosphatase: 48 U/L (ref 38–126)
Anion gap: 7 (ref 5–15)
BUN: 29 mg/dL — ABNORMAL HIGH (ref 8–23)
CO2: 26 mmol/L (ref 22–32)
Calcium: 8.9 mg/dL (ref 8.9–10.3)
Chloride: 106 mmol/L (ref 98–111)
Creatinine, Ser: 0.89 mg/dL (ref 0.44–1.00)
GFR, Estimated: >60 mL/min (ref >60)
Glucose, Bld: 101 mg/dL — ABNORMAL HIGH (ref 70–99)
Potassium: 4.4 mmol/L (ref 3.5–5.1)
Sodium: 139 mmol/L (ref 135–145)
Total Bilirubin: 0.6 mg/dL (ref 0.3–1.2)
Total Protein: 7 g/dL (ref 6.5–8.1)

## 2020-04-09 LAB — CBC WITH DIFFERENTIAL/PLATELET
Abs Immature Granulocytes: 0.01 10*3/uL (ref 0.00–0.07)
Basophils Absolute: 0 10*3/uL (ref 0.0–0.1)
Basophils Relative: 1 %
Eosinophils Absolute: 0.2 10*3/uL (ref 0.0–0.5)
Eosinophils Relative: 5 %
HCT: 40.7 % (ref 36.0–46.0)
Hemoglobin: 13.3 g/dL (ref 12.0–15.0)
Immature Granulocytes: 0 %
Lymphocytes Relative: 35 %
Lymphs Abs: 1.6 10*3/uL (ref 0.7–4.0)
MCH: 31.1 pg (ref 26.0–34.0)
MCHC: 32.7 g/dL (ref 30.0–36.0)
MCV: 95.3 fL (ref 80.0–100.0)
Monocytes Absolute: 0.5 10*3/uL (ref 0.1–1.0)
Monocytes Relative: 10 %
Neutro Abs: 2.3 10*3/uL (ref 1.7–7.7)
Neutrophils Relative %: 49 %
Platelets: 184 10*3/uL (ref 150–400)
RBC: 4.27 MIL/uL (ref 3.87–5.11)
RDW: 13.5 % (ref 11.5–15.5)
WBC: 4.6 10*3/uL (ref 4.0–10.5)
nRBC: 0 % (ref 0.0–0.2)

## 2020-04-09 LAB — IRON AND TIBC
Iron: 49 ug/dL (ref 28–170)
Saturation Ratios: 13 % (ref 10.4–31.8)
TIBC: 391 ug/dL (ref 250–450)
UIBC: 342 ug/dL

## 2020-04-09 LAB — FERRITIN: Ferritin: 20 ng/mL (ref 11–307)

## 2020-04-11 ENCOUNTER — Encounter: Payer: Self-pay | Admitting: Oncology

## 2020-04-11 ENCOUNTER — Ambulatory Visit (INDEPENDENT_AMBULATORY_CARE_PROVIDER_SITE_OTHER): Payer: Medicare Other | Admitting: Urology

## 2020-04-11 ENCOUNTER — Encounter: Payer: Self-pay | Admitting: Urology

## 2020-04-11 ENCOUNTER — Inpatient Hospital Stay (HOSPITAL_BASED_OUTPATIENT_CLINIC_OR_DEPARTMENT_OTHER): Payer: Medicare Other | Admitting: Oncology

## 2020-04-11 ENCOUNTER — Inpatient Hospital Stay: Payer: Medicare Other | Admitting: Oncology

## 2020-04-11 ENCOUNTER — Inpatient Hospital Stay: Payer: Medicare Other

## 2020-04-11 ENCOUNTER — Other Ambulatory Visit: Payer: Self-pay

## 2020-04-11 ENCOUNTER — Encounter: Payer: Self-pay | Admitting: *Deleted

## 2020-04-11 VITALS — BP 128/71 | HR 78 | Ht 62.0 in | Wt 168.0 lb

## 2020-04-11 DIAGNOSIS — N2889 Other specified disorders of kidney and ureter: Secondary | ICD-10-CM

## 2020-04-11 DIAGNOSIS — D509 Iron deficiency anemia, unspecified: Secondary | ICD-10-CM

## 2020-04-11 DIAGNOSIS — I2510 Atherosclerotic heart disease of native coronary artery without angina pectoris: Secondary | ICD-10-CM

## 2020-04-11 DIAGNOSIS — N200 Calculus of kidney: Secondary | ICD-10-CM

## 2020-04-11 NOTE — Progress Notes (Signed)
   04/11/20 4:28 PM   Debbie Delacruz December 20, 1945 [id number]  CC: Right renal mass, nephrolithiasis  HPI: I saw [name] back in urology clinic for the above issues.  Briefly she is a comorbid 74 year old female who originally presented with sepsis from urinary source on 02/20/2020 with a 4 mm left distal ureteral stone and underwent emergent left ureteral stent placement at that time.  She improved with antibiotics and was ultimately discharged.  She also incidentally was found to have a 4 cm right upper pole enhancing renal mass at that time.  Follow-up CT chest showed no evidence of metastatic disease.  She underwent subsequent follow-up ureteroscopy in October that showed no residual stones, and her stent was removed.  Follow-up renal ultrasound showed no left sided hydronephrosis, and stable right-sided renal mass.  She has extensive surgical history including hysterectomy, appendectomy, cholecystectomy, and numerous hernia surgeries.  She has significant right lower quadrant scarring on exam today.  She denies any flank pain, gross hematuria, or other complaints today.  Most recent renal function is normal with a creatinine of 0.89, EGFR greater than 60.  We had a long conversation about her 4 cm enhancing right upper renal mass, and likely diagnosis of malignancy, most commonly renal cell carcinoma.  We reviewed that this is typically treated surgically, though there are new adjuvant therapies that are options for patients with intermediate or high risk disease.  We discussed treatment options including robotic versus open partial nephrectomy and radical nephrectomy, and the risks and benefits.  We also reviewed percutaneous ablation, though this is typically a better treatment strategy when masses are under 3 cm.  Finally, we reviewed active surveillance, though again this is typically a better option when masses are smaller than 3 to 4 cm in size.  I personally viewed and interpreted  her CT scans that show a 4 cm enhancing renal mass just adjacent to the hilar vessels, and in close proximity to the IVC, with no evidence of lymphadenopathy or metastatic disease in the chest.  I think with her extensive medical history, significant past surgical history, and renal mass in close proximity to the hilar vessels she would benefit from evaluation at [facility] for consideration of a partial nephrectomy.  She is amenable to this.  Referral to [facility] for consideration of right partial nephrectomy Virtual visit 2 months  Nickolas Madrid, MD 04/11/2020

## 2020-04-11 NOTE — Patient Instructions (Signed)
Minimally Invasive Nephrectomy Minimally invasive nephrectomy is a surgical procedure to remove a kidney. This procedure is done through several small incisions in the abdomen. You may have surgery to remove:  The entire kidney and some surrounding structures (radical nephrectomy).  Only the damaged or diseased part of the kidney (partial nephrectomy). You may need this surgery if:  Your kidney is severely damaged from kidney disease, infection, or cancer.  You were born with an abnormal kidney.  You are donating a healthy kidney. Tell a health care provider about:  Any allergies you have.  All medicines you are taking, including vitamins, herbs, eye drops, creams, and over-the-counter medicines.  Any problems you or family members have had with anesthetic medicines.  Any blood disorders you have.  Any surgeries you have had.  Any medical conditions you have.  Whether you are pregnant or may be pregnant. What are the risks? Generally, this is a safe procedure. However, problems may occur, including:  Bleeding.  Infection.  Pneumonia.  Damage to other body structures near the kidney.  Allergic reactions to medicines. A bone (usually part of a rib) or more tissue may need to be removed so the surgeon can get to the kidney. If this happens, the minimally invasive procedure may need to be changed to an open procedure. What happens before the procedure? Staying hydrated Follow instructions from your health care provider about hydration, which may include:  Up to 2 hours before the procedure - you may continue to drink clear liquids, such as water, clear fruit juice, black coffee, and plain tea.  Eating and drinking restrictions Follow instructions from your health care provider about eating and drinking, which may include:  8 hours before the procedure - stop eating heavy meals or foods, such as meat, fried foods, or fatty foods.  6 hours before the procedure - stop  eating light meals or foods, such as toast or cereal.  6 hours before the procedure - stop drinking milk or drinks that contain milk.  2 hours before the procedure - stop drinking clear liquids. Medicines Ask your health care provider about:  Changing or stopping your regular medicines. This is especially important if you are taking diabetes medicines or blood thinners.  Taking medicines such as aspirin and ibuprofen. These medicines can thin your blood. Do not take these medicines unless your health care provider tells you to take them.  Taking over-the-counter medicines, vitamins, herbs, and supplements. General instructions  Plan to have someone take you home from the hospital or clinic.  Do not use any products that contain nicotine or tobacco for at least 4 weeks before the procedure. These products include cigarettes, e-cigarettes, and chewing tobacco. If you need help quitting, ask your health care provider.  Ask your health care provider: ? How your surgery site will be marked. ? What steps will be taken to help prevent infection. These may include:  Removing hair at the surgery site.  Washing skin with a germ-killing soap.  Taking antibiotic medicine. What happens during the procedure?     Before surgery begins  An IV will be inserted into one of your veins.  You will be given a medicine to make you fall asleep (general anesthetic).  Tight-fitting stockings (compression stockings) may be placed on your legs to help blood flow.  A small, thin tube will be placed in your bladder (urinary catheter) to drain urine.  A tube will be placed through your nose and into your stomach (nasogastric tube)  to drain stomach fluids. Surgery  A small incision will be made in your abdomen to insert a thin, lighted tube (laparoscope) with a camera attached. This lets your surgeon see your kidney during the procedure.  More small incisions will be made to insert other surgical  tools. One incision may be slightly larger to remove your kidney. ? In some cases, the surgeon will do the procedure by controlling tools that are attached to a surgical robot. This is called a robot-assisted nephrectomy.  Depending on the type of procedure you are having, one of the following will be done: ? If you are having a partial nephrectomy:  The blood vessels attached to your kidney will be clamped.  Part of your kidney will be removed. The kidney will be closed with stitches (sutures), and the clamp on the blood vessels will be removed. ? If you are having a radical nephrectomy:  All of the blood vessels that attach to your kidney will be closed and cut.  Part of the tube that carries urine from your kidney to your bladder (ureter) will be removed.  Your kidney will be removed.  All of the surgical tools will be removed from your body.  A small tube (drain) may be placed near one of the incisions to drain extra fluid from the surgical area.  The incisions may be closed with sutures or another type of closure.  A bandage (dressing) will be placed over the incision area. The procedure may vary among health care providers and hospitals. What happens after the procedure?  Your blood pressure, heart rate, breathing rate, and blood oxygen level will be monitored until you leave the hospital or clinic.  Your nasogastric tube will be removed.  You may continue to have: ? An IV until you can drink fluids on your own. ? A urinary catheter. Your urine output will be checked. ? A drain. Your surgical drain will be removed after a few days. Your health care provider will instruct you how to care for the drain at home. ? Pain medicine. This will be given through your IV.  You will be encouraged to: ? Get out of bed and walk as soon as possible. ? Do breathing exercises, such as coughing and breathing deeply. This helps prevent pneumonia. Summary  Minimally invasive nephrectomy  is a surgical procedure to remove a kidney. In some cases, only the damaged or diseased part of the kidney is removed.  This procedure is done through several small incisions in the abdomen.  Follow instructions from your health care provider about taking medicines and about eating and drinking before the procedure.  You will be given a medicine to make you fall asleep (general anesthetic) during the procedure. This information is not intended to replace advice given to you by your health care provider. Make sure you discuss any questions you have with your health care provider. Document Revised: 06/07/2018 Document Reviewed: 06/07/2018 Elsevier Patient Education  2020 [place].   Kidney Cancer  Kidney cancer is an abnormal growth of cells in one or both kidneys. The kidneys filter waste from your blood and produce urine. Kidney cancer may spread to other parts of your body. This type of cancer may also be called renal cell carcinoma. What are the causes? The cause of this condition is not always known. In some cases, abnormal changes to genes (genetic mutations) can cause cells to form cancer. What increases the risk? You may be more likely to develop kidney cancer  if you:  Are over age 62. The risk increases with age.  Have a family history of kidney cancer.  Are of African-American, Native American, or Native [place] descent.  Smoke.  Are female.  Are obese.  Have high blood pressure (hypertension).  Have advanced kidney disease, especially if you need long-term dialysis.  Have certain conditions that are passed from parent to child (inherited), such as von Hippel-Lindau disease, tuberous sclerosis, or hereditary papillary renal carcinoma.  Have been exposed to certain chemicals. What are the signs or symptoms? In the early stages, kidney cancer does not cause symptoms. As the cancer grows, symptoms may include:  Blood in the urine.  Pain in the upper back or  abdomen, just below the rib cage. You may feel pain on one or both sides of the body.  Fatigue.  Unexplained weight loss.  Fever. How is this diagnosed? This condition may be diagnosed based on:  Your symptoms and medical history.  A physical exam.  Blood and urine tests.  X-rays.  Imaging tests, such as CT scans, MRIs, and PET scans.  Having dye injected into your blood through an IV, and then having X-rays taken of: ? Your kidneys and the rest of the organs involved in making and storing urine (intravenous pyelogram). ? Your blood vessels (angiogram).  Removal and testing of a kidney tissue sample (biopsy). Your cancer will be assessed (staged), based on how severe it is and how much it has spread. How is this treated? Treatment depends on the type and stage of the cancer. Treatment may include one or more of the following:  Surgery. This may include surgery to remove: ? Just the tumor (nephron-sparing surgery). ? The entire kidney (nephrectomy). ? The kidney, some of the surrounding healthy tissue, nearby lymph nodes, and the adrenal gland in certain cases (radical nephrectomy).  Medicines that kill cancer cells (chemotherapy).  High-energy rays that kill cancer cells (radiation therapy).  Targeted therapy. This targets specific parts of cancer cells and the area around them to block the growth and the spread of the cancer. Targeted therapy can help to limit the damage to healthy cells.  Medicines that help your body's disease-fighting system (immune system) fight cancer cells (immunotherapy).  Freezing cancer cells using gas or liquid that is delivered through a needle (cryoablation).  Destroying cancer cells using high-energy radio waves that are delivered through a needle-like probe (radiofrequency ablation).  A procedure to block the artery that supplies blood to the tumor, which kills the cancer cells (embolization). Follow these instructions at home: Eating  and drinking  Some of your treatments might affect your appetite and your ability to chew and swallow. If you are having problems eating, or if you do not have an appetite, meet with a diet and nutrition specialist (dietitian).  If you have side effects that affect eating, it may help to: ? Eat smaller meals and snacks often. ? Drink high-nutrition and high-calorie shakes or supplements. ? Eat bland and soft foods that are easy to eat. ? Not eat foods that are hot, spicy, or hard to swallow. Lifestyle  Do not drink alcohol.  Do not use any products that contain nicotine or tobacco, such as cigarettes and e-cigarettes. If you need help quitting, ask your health care provider. General instructions   Take over-the-counter and prescription medicines only as told by your health care provider. This includes vitamins, supplements, and herbal products.  Consider joining a support group to help you cope with the stress  of having kidney cancer.  Work with your health care provider to manage any side effects of treatment.  Keep all follow-up visits as told by your health care provider. This is important. Where to find more information  American Cancer Society: https://www.cancer.[place]): https://www.cancer.gov Contact a health care provider if you:  Notice that you bruise or bleed easily.  Are losing weight without trying.  Have new or increased fatigue or weakness. Get help right away if you have:  Blood in your urine.  A sudden increase in pain.  A fever.  Shortness of breath.  Chest pain.  Yellow skin or whites of your eyes (jaundice). Summary  Kidney cancer is an abnormal growth of cells (tumor) in one or both kidneys. Tumors may spread to other parts of your body.  In the early stages, kidney cancer does not cause symptoms. As the cancer grows, symptoms may include blood in the urine, pain in the upper back or abdomen, unexplained weight  loss, fatigue, and fever.  Treatment depends on the type and stage of the cancer. It may include surgery to remove the tumor, procedures and medicines to kill the cancer cells, or medicines to help your body fight cancer cells. This information is not intended to replace advice given to you by your health care provider. Make sure you discuss any questions you have with your health care provider. Document Revised: 06/02/2017 Document Reviewed: 05/30/2017 Elsevier Patient Education  [place].

## 2020-04-11 NOTE — Progress Notes (Signed)
Patient is currently going through work up for kidney mass after hospitalization in 01/2020.

## 2020-04-13 NOTE — Progress Notes (Addendum)
[place]  [phone] [phone]  ID: Debbie Delacruz OB: January 16, 1946  MR#: [id number]  CHY#:[id number]  Patient Care Team: [name], MD as PCP - General (Family Medicine) [name], MD (Internal Medicine) [name], MD as Consulting Physician (Cardiothoracic Surgery) [name], MD as Consulting Physician (Cardiology) Lloyd Huger, MD as Consulting Physician (Oncology)   I connected with Debbie Delacruz on 04/13/20 at  1:45 PM EST by video enabled telemedicine visit and verified that I am speaking with the correct person using two identifiers.   I discussed the limitations, risks, security and privacy concerns of performing an evaluation and management service by telemedicine and the availability of in-person appointments. I also discussed with the patient that there may be a patient responsible charge related to this service. The patient expressed understanding and agreed to proceed.   Other persons participating in the visit and their role in the encounter: Patient, MD.  Patient's location: Home. Provider's location: Clinic.  CHIEF COMPLAINT: Iron deficiency anemia, right renal mass.  INTERVAL HISTORY: Patient agreed to video assisted telemedicine visit for further evaluation and discussion of her laboratory results.  She currently feels well and is asymptomatic.  She does not complain of any weakness or fatigue.  She has no neurologic complaints. She denies any recent fevers or illnesses. She has a good appetite and denies weight loss.  She denies any chest pain, shortness of breath, cough, or hemoptysis.  She denies any nausea, vomiting, constipation, or diarrhea. She denies any melena or hematochezia. She has no urinary complaints.  Patient offers no specific complaints today.  REVIEW OF SYSTEMS:   Review of Systems  Constitutional: Negative.  Negative for fever, malaise/fatigue and weight  loss.  Respiratory: Negative.  Negative for cough and shortness of breath.   Cardiovascular: Negative.  Negative for chest pain and leg swelling.  Gastrointestinal: Negative.  Negative for abdominal pain, blood in stool and melena.  Genitourinary: Negative.  Negative for hematuria.  Musculoskeletal: Negative.  Negative for joint pain.  Skin: Negative.  Negative for rash.  Neurological: Negative.  Negative for dizziness, sensory change, focal weakness, weakness and headaches.  Psychiatric/Behavioral: Negative.  The patient is not nervous/anxious and does not have insomnia.     As per HPI. Otherwise, a complete review of systems is negative.  PAST MEDICAL HISTORY: Past Medical History:  Diagnosis Date  . A-fib ([place])   . Anemia   . Anginal pain ([place])   . Anxiety   . Arthritis   . CAD (coronary artery disease)   . Cancer ([place])   . Carpal tunnel syndrome    bilateral  . Cataract    bil removed cataracts  . Coronary artery disease    2x stents, Dr. Clayborn Bigness  . Depression   . Dysrhythmia   . Family history of adverse reaction to anesthesia    "brother; S/P lipotripsy in [place]; transferred to [facility]; had to put him on life support for 2 days"  . GERD (gastroesophageal reflux disease)   . Headache    "weekly" (09/13/2014)  . History of blood transfusion 05/2014   "we haven't figured out why I needed it yet" (09/13/2014)  . History of hiatal hernia   . Hyperglycemia   . Hyperlipidemia   . Hypertension   . Kidney stone   . Myocardial infarct ([place])   . Myocardial infarct, old 1999   15 years ago  . Obesity   . PONV (  postoperative nausea and vomiting)   . Renal mass   . S/P CABG x 4 09/18/2014   LIMA to LAD, SVG to PDA-dRCA sequentially, SVG to OM, EVH via right thigh   . Sleep apnea   . Sleep apnea    "don't use a mask" (09/13/2014)  . Unstable angina ([place])     PAST SURGICAL HISTORY: Past Surgical History:  Procedure Laterality Date  . ABDOMINAL HYSTERECTOMY     . APPENDECTOMY    . BACK SURGERY    . BLADDER SURGERY    . CARDIAC CATHETERIZATION  06/2014  . CARDIAC CATHETERIZATION N/A 06/16/2016   Procedure: Left Heart Cath and Cors/Grafts Angiography;  Surgeon: Burnell Blanks, MD;  Location: [facility];  Service: Cardiovascular;  Laterality: N/A;  . CARPAL TUNNEL RELEASE Left   . CATARACT EXTRACTION W/ INTRAOCULAR LENS  IMPLANT, BILATERAL    . CHOLECYSTECTOMY    . COLONOSCOPY    . CORONARY ANGIOPLASTY WITH STENT PLACEMENT  1999   [facility] x2 stent  . CORONARY ARTERY BYPASS GRAFT N/A 09/18/2014   Procedure: CORONARY ARTERY BYPASS GRAFTING (CABG)times four using LIMA  to LAD:SVG to  PD and DIST RCA;SVG to OM;, EVH Right thigh;  Surgeon: [name], MD;  Location: [facility];  Service: Open Heart Surgery;  Laterality: N/A;  . CYSTOSCOPY W/ RETROGRADES Left 03/15/2020   Procedure: CYSTOSCOPY WITH RETROGRADE PYELOGRAM;  Surgeon: Billey Co, MD;  Location: [facility];  Service: Urology;  Laterality: Left;  . CYSTOSCOPY WITH STENT PLACEMENT Left 02/20/2020   Procedure: CYSTOSCOPY WITH STENT PLACEMENT;  Surgeon: Billey Co, MD;  Location: [facility];  Service: Urology;  Laterality: Left;  . CYSTOSCOPY/URETEROSCOPY/HOLMIUM LASER/STENT PLACEMENT Left 03/15/2020   Procedure: CYSTOSCOPY/URETEROSCOPY;  Surgeon: Billey Co, MD;  Location: [facility];  Service: Urology;  Laterality: Left;  . ESOPHAGOGASTRODUODENOSCOPY N/A 09/17/2014   Procedure: ESOPHAGOGASTRODUODENOSCOPY (EGD);  Surgeon: Inda Castle, MD;  Location: [facility];  Service: Endoscopy;  Laterality: N/A;  . heart stents     1999  . INCONTINENCE SURGERY    . INGUINAL HERNIA REPAIR    . INGUINAL HERNIA REPAIR Right X 3   "last one was in the 1990's; still have hernia there now" (09/13/2014)  . LAPAROSCOPIC CHOLECYSTECTOMY    . LUMBAR DISC SURGERY  1990's?  . LUMBAR SPINE SURGERY    . removal of ovaries    . TEE WITHOUT CARDIOVERSION N/A 09/18/2014   Procedure:  TRANSESOPHAGEAL ECHOCARDIOGRAM (TEE);  Surgeon: [name], MD;  Location: [facility];  Service: Open Heart Surgery;  Laterality: N/A;  . UPPER GASTROINTESTINAL ENDOSCOPY      FAMILY HISTORY: Family History  Problem Relation Age of Onset  . Coronary artery disease Mother   . Osteoporosis Mother   . Heart disease Mother   . Stroke Mother   . Heart attack Mother   . Hypertension Mother   . Hypertension Father   . Coronary artery disease Father   . COPD Father   . Heart disease Father   . Heart attack Father   . Glaucoma Father   . Osteoporosis Father   . Emphysema Father   . Cancer Sister 73       colon  . Hypertension Sister   . Colon cancer Sister        dx in her 30's  . Breast cancer Sister   . Colon polyps Sister   . Stroke Brother   . Hypertension Brother   . Colon  polyps Brother   . Stroke Maternal Grandfather   . Stroke Maternal Grandmother   . Osteoporosis Maternal Grandmother   . Hypertension Maternal Grandmother   . Hypertension Paternal Grandmother   . Hypertension Paternal Grandfather   . Breast cancer Maternal Aunt        2 aunts-50's  . Esophageal cancer Neg Hx   . Stomach cancer Neg Hx   . Rectal cancer Neg Hx   . Pancreatic cancer Neg Hx     ADVANCED DIRECTIVES (Y/N):  N  HEALTH MAINTENANCE: Social History   Tobacco Use  . Smoking status: Never Smoker  . Smokeless tobacco: Never Used  Vaping Use  . Vaping Use: Never used  Substance Use Topics  . Alcohol use: Not Currently    Alcohol/week: 0.0 standard drinks  . Drug use: No     Colonoscopy:  PAP:  Bone density:  Lipid panel:  Allergies  Allergen Reactions  . Ferrous Sulfate [Ferrous Fumarate] Other (See Comments)    STOMACH ISSUES  . Lipitor [Atorvastatin] Other (See Comments)    myalgia    Current Outpatient Medications  Medication Sig Dispense Refill  . acetaminophen (TYLENOL) 500 MG tablet Take 500 mg by mouth every 6 (six) hours as needed for headache.    Marland Kitchen aspirin EC 81  MG tablet Take 1 tablet (81 mg total) by mouth daily.    . isosorbide mononitrate (IMDUR) 30 MG 24 hr tablet Take 1 tablet (30 mg total) by mouth daily. (Patient taking differently: Take 30 mg by mouth every morning. ) 90 tablet 1  . lisinopril (ZESTRIL) 10 MG tablet TAKE 1 TABLET BY MOUTH ONCE DAILY. PLEASE CALL TO SCHEDULE APPOINTMENT FOR FUTURE REFILLS (Patient taking differently: Take 10 mg by mouth every morning. ) 90 tablet 1  . metoprolol succinate (TOPROL-XL) 50 MG 24 hr tablet TAKE 1 TABLET BY MOUTH ONCE DAILY 30 tablet 3  . rosuvastatin (CRESTOR) 40 MG tablet TAKE 1 TABLET BY MOUTH ONCE DAILY (Patient taking differently: Take 40 mg by mouth every morning. ) 90 tablet 1  . amLODipine (NORVASC) 5 MG tablet Take 1 tablet (5 mg total) by mouth daily. 90 tablet 3  . nitroGLYCERIN (NITROSTAT) 0.4 MG SL tablet Place 1 tablet (0.4 mg total) under the tongue every 5 (five) minutes as needed for chest pain. Take for up to 3 doses and then call 911 if still having chest pain. 50 tablet 3  . ondansetron (ZOFRAN) 4 MG tablet Take 1 tablet (4 mg total) by mouth every 8 (eight) hours as needed for up to 10 doses for nausea or vomiting. (Patient not taking: Reported on 04/11/2020) 10 tablet 0   No current facility-administered medications for this visit.    OBJECTIVE: There were no vitals filed for this visit.   There is no height or weight on file to calculate BMI.    ECOG FS:0 - Asymptomatic  General: Well-developed, well-nourished, no acute distress. Eyes: Pink conjunctiva, anicteric sclera. HEENT: Normocephalic, moist mucous membranes. Lungs: No audible wheezing or coughing. Heart: Regular rate and rhythm. Abdomen: Soft, nontender, no obvious distention. Musculoskeletal: No edema, cyanosis, or clubbing. Neuro: Alert, answering all questions appropriately. Cranial nerves grossly intact. Skin: No rashes or petechiae noted. Psych: Normal affect.  LAB RESULTS:  Lab Results  Component Value  Date   NA 139 04/09/2020   K 4.4 04/09/2020   CL 106 04/09/2020   CO2 26 04/09/2020   GLUCOSE 101 (H) 04/09/2020   BUN 29 (H) 04/09/2020  CREATININE 0.89 04/09/2020   CALCIUM 8.9 04/09/2020   PROT 7.0 04/09/2020   ALBUMIN 3.9 04/09/2020   AST 19 04/09/2020   ALT 14 04/09/2020   ALKPHOS 48 04/09/2020   BILITOT 0.6 04/09/2020   GFRNONAA >60 04/09/2020   GFRAA >60 02/24/2020    Lab Results  Component Value Date   WBC 4.6 04/09/2020   NEUTROABS 2.3 04/09/2020   HGB 13.3 04/09/2020   HCT 40.7 04/09/2020   MCV 95.3 04/09/2020   PLT 184 04/09/2020   Lab Results  Component Value Date   IRON 49 04/09/2020   TIBC 391 04/09/2020   IRONPCTSAT 13 04/09/2020   Lab Results  Component Value Date   FERRITIN 20 04/09/2020     STUDIES: CT CHEST W CONTRAST  Result Date: 04/02/2020 CLINICAL DATA:  Solid right renal mass compatible with renal cell carcinoma. Assess for metastatic disease. EXAM: CT CHEST WITH CONTRAST TECHNIQUE: Multidetector CT imaging of the chest was performed during intravenous contrast administration. CONTRAST:  77mL OMNIPAQUE IOHEXOL 300 MG/ML  SOLN COMPARISON:  CT abdomen pelvis 02/19/2020, 07/26/2014. Chest x-ray 02/21/2020 FINDINGS: Cardiovascular: Post CABG changes. Normal heart size. No pericardial effusion. Thoracic aorta is normal in course and caliber. Three vessel arch. Atherosclerotic calcification of the aorta and coronary arteries. Pulmonary vasculature is nondilated. No central filling defects. Mediastinum/Nodes: No axillary, mediastinal, or hilar lymphadenopathy. 6 mm low-density right thyroid lobe nodule. Small hiatal hernia. Esophagus otherwise unremarkable. Trachea within normal limits. Lungs/Pleura: 3 mm nodule within the right lower lobe in the azygoesophageal recess (series 3, image 114) unchanged from prior CT 07/26/2014, benign. No additional pulmonary nodules or masses. No focal airspace consolidation. No pleural effusion or pneumothorax. Upper  Abdomen: Redemonstration of a heterogeneously enhancing mass emanating from the superior pole of the right kidney measuring approximately 4.0 x 3.6 x 3.8 cm, unchanged from prior. There is reflux of contrast into the IVC and hepatic veins. Remaining visualized upper abdomen is within normal limits. Musculoskeletal: No acute osseous finding. No suspicious bone lesion. No chest wall abnormality. IMPRESSION: 1. No evidence of metastatic disease within the chest. 2. Redemonstration of a 4.0 cm heterogeneously enhancing mass emanating from the superior pole of the right kidney, consistent with renal cell carcinoma. 3. Reflux of contrast into the IVC and hepatic veins, which can be seen with right heart dysfunction. 4. Aortic atherosclerosis. (ICD10-I70.0). Electronically Signed   By: Davina Poke D.O.   On: 04/02/2020 15:37   US RENAL  Result Date: 04/02/2020 CLINICAL DATA:  Follow-up examination for left ureteral stone, evaluate hydronephrosis. EXAM: RENAL / URINARY TRACT ULTRASOUND COMPLETE COMPARISON:  Prior CT from 02/19/2020. FINDINGS: Right Kidney: Renal measurements: 9.5 x 4.7 x 5.4 cm = volume: 127.4 mL. Renal echogenicity within normal limits. No nephrolithiasis or hydronephrosis. Solid mass positioned at the upper pole measures 3.6 x 3.9 x 3.8 cm, concerning for a primary renal neoplasm. Associated internal vascularity. Left Kidney: Renal measurements: 9.5 x 4.6 x 5.2 cm = volume: 119.4 mL. Renal echogenicity within normal limits. No shadowing echogenic calculi. Previously seen left-sided hydronephrosis has resolved. No focal renal mass. Bladder: Appears normal for degree of bladder distention. Neither ureteral jet visualized. Other: None. IMPRESSION: 1. Interval resolution of previously seen left-sided hydronephrosis. 2. 3.9 cm solid mass at the upper pole of the right kidney, concerning for a primary renal neoplasm/RCC. Finding better seen on recent CT from 02/19/2020. Electronically Signed   By:  Jeannine Boga M.D.   On: 04/02/2020 00:36   DG OR  UROLOGY CYSTO IMAGE ([facility] ONLY)  Result Date: 03/15/2020 There is no interpretation for this exam.  This order is for images obtained during a surgical procedure.  Please See "Surgeries" Tab for more information regarding the procedure.    ASSESSMENT: Iron deficiency anemia.  PLAN:    1. Iron deficiency anemia: Patient's hemoglobin iron stores continue to be within normal limits. Previously, she reported she cannot tolerate oral iron supplementation. The remainder of her laboratory work is either negative or within normal limits.  Patient had a normal endoscopy in May 2017 and a normal colonoscopy in August 2014.  Patient will likely require repeat colonoscopy in the near future.  She does not require additional Feraheme today.  She last received IV Feraheme on May 08, 2019.  No intervention is needed at this time.  After discussion with the patient, is agreed upon that no further follow-up is necessary.  Please refer her back if there are any questions or concerns. 2.  Right renal mass: Highly suspicious for underlying malignancy.  Patient has been referred to [facility] by urology for right partial nephrectomy.  Continue follow-up and monitoring per them.  Please refer patient back if there is any need for additional treatment.  I provided 30 minutes of face-to-face video visit time during this encounter which included chart review, counseling, and coordination of care as documented above.    Patient expressed understanding and was in agreement with this plan. She also understands that She can call clinic at any time with any questions, concerns, or complaints.    Lloyd Huger, MD   04/13/2020 10:36 AM

## 2020-04-22 ENCOUNTER — Encounter: Payer: Self-pay | Admitting: Urology

## 2020-05-07 ENCOUNTER — Encounter: Payer: Self-pay | Admitting: Family Medicine

## 2020-05-07 ENCOUNTER — Telehealth (INDEPENDENT_AMBULATORY_CARE_PROVIDER_SITE_OTHER): Payer: Medicare Other | Admitting: Family Medicine

## 2020-05-07 ENCOUNTER — Other Ambulatory Visit: Payer: Self-pay

## 2020-05-07 DIAGNOSIS — I10 Essential (primary) hypertension: Secondary | ICD-10-CM

## 2020-05-07 DIAGNOSIS — E7800 Pure hypercholesterolemia, unspecified: Secondary | ICD-10-CM | POA: Diagnosis not present

## 2020-05-07 DIAGNOSIS — Z85528 Personal history of other malignant neoplasm of kidney: Secondary | ICD-10-CM | POA: Insufficient documentation

## 2020-05-07 DIAGNOSIS — N2889 Other specified disorders of kidney and ureter: Secondary | ICD-10-CM

## 2020-05-07 DIAGNOSIS — D509 Iron deficiency anemia, unspecified: Secondary | ICD-10-CM | POA: Diagnosis not present

## 2020-05-07 NOTE — Progress Notes (Signed)
Virtual Visit via telephone Note  This visit type was conducted due to national recommendations for restrictions regarding the COVID-19 pandemic (e.g. social distancing).  This format is felt to be most appropriate for this patient at this time.  All issues noted in this document were discussed and addressed.  No physical exam was performed (except for noted visual exam findings with Video Visits).   I connected with Debbie Delacruz today at  9:00 AM EST by a video enabled telemedicine application or telephone and verified that I am speaking with the correct person using two identifiers. Location patient: work Location provider: home office Persons participating in the virtual visit: patient, provider  I discussed the limitations, risks, security and privacy concerns of performing an evaluation and management service by telephone and the availability of in person appointments. I also discussed with the patient that there may be a patient responsible charge related to this service. The patient expressed understanding and agreed to proceed.  Interactive audio and video telecommunications were attempted between this provider and patient, however failed, due to patient having technical difficulties OR patient did not have access to video capability.  We continued and completed visit with audio only.  Reason for visit: f/u  HPI: HYPERTENSION  Disease Monitoring  Home BP Monitoring 114/72 this morning Chest pain- no    Dyspnea- o Medications  Compliance-  Taking amlodipine, imdur, lisinopril, metoprolol, has not required any nitro.  Edema- no  HYPERLIPIDEMIA Symptoms Chest pain on exertion:  no Medications: Compliance- taking crestor Right upper quadrant pain- no  Muscle aches- no  Renal mass/stone/sepsis: patient has recovered well from sepsis related to a renal stone. She has followed up with urology for the renal mass and has been referred to [facility] to consider surgical options given her  complicated history and the location of the mass. She see's [facility] on 12/27. She wonders what her blood type is and I advise O+ with negative antibody screen, though advised this was 5 years ago and they would need to repeat this if they needed a blood type.  Iron deficiency: levels have been stable over the past year so Dr Grayland Ormond release her to follow-up with me on this.       ROS: See pertinent positives and negatives per HPI.  Past Medical History:  Diagnosis Date  . A-fib ([place])   . Anemia   . Anginal pain ([place])   . Anxiety   . Arthritis   . CAD (coronary artery disease)   . Cancer ([place])   . Carpal tunnel syndrome    bilateral  . Cataract    bil removed cataracts  . Coronary artery disease    2x stents, Dr. Clayborn Bigness  . Depression   . Dysrhythmia   . Family history of adverse reaction to anesthesia    "brother; S/P lipotripsy in [place]; transferred to [facility]; had to put him on life support for 2 days"  . GERD (gastroesophageal reflux disease)   . Headache    "weekly" (09/13/2014)  . History of blood transfusion 05/2014   "we haven't figured out why I needed it yet" (09/13/2014)  . History of hiatal hernia   . Hyperglycemia   . Hyperlipidemia   . Hypertension   . Kidney stone   . Myocardial infarct ([place])   . Myocardial infarct, old 1999   15 years ago  . Obesity   . PONV (postoperative nausea and vomiting)   . Renal mass   . S/P CABG  x 4 09/18/2014   LIMA to LAD, SVG to PDA-dRCA sequentially, SVG to OM, EVH via right thigh   . Sleep apnea   . Sleep apnea    "don't use a mask" (09/13/2014)  . Unstable angina [facility])     Past Surgical History:  Procedure Laterality Date  . ABDOMINAL HYSTERECTOMY    . APPENDECTOMY    . BACK SURGERY    . BLADDER SURGERY    . CARDIAC CATHETERIZATION  06/2014  . CARDIAC CATHETERIZATION N/A 06/16/2016   Procedure: Left Heart Cath and Cors/Grafts Angiography;  Surgeon: Burnell Blanks, MD;  Location: [place] [facility];   Service: Cardiovascular;  Laterality: N/A;  . CARPAL TUNNEL RELEASE Left   . CATARACT EXTRACTION W/ INTRAOCULAR LENS  IMPLANT, BILATERAL    . CHOLECYSTECTOMY    . COLONOSCOPY    . CORONARY ANGIOPLASTY WITH STENT PLACEMENT  1999   [facility] x2 stent  . CORONARY ARTERY BYPASS GRAFT N/A 09/18/2014   Procedure: CORONARY ARTERY BYPASS GRAFTING (CABG)times four using LIMA  to LAD:SVG to  PD and DIST RCA;SVG to OM;, EVH Right thigh;  Surgeon: [name], MD;  Location: [place];  Service: Open Heart Surgery;  Laterality: N/A;  . CYSTOSCOPY W/ RETROGRADES Left 03/15/2020   Procedure: CYSTOSCOPY WITH RETROGRADE PYELOGRAM;  Surgeon: Billey Co, MD;  Location: [facility];  Service: Urology;  Laterality: Left;  . CYSTOSCOPY WITH STENT PLACEMENT Left 02/20/2020   Procedure: CYSTOSCOPY WITH STENT PLACEMENT;  Surgeon: Billey Co, MD;  Location: [facility];  Service: Urology;  Laterality: Left;  . CYSTOSCOPY/URETEROSCOPY/HOLMIUM LASER/STENT PLACEMENT Left 03/15/2020   Procedure: CYSTOSCOPY/URETEROSCOPY;  Surgeon: Billey Co, MD;  Location: [facility];  Service: Urology;  Laterality: Left;  . ESOPHAGOGASTRODUODENOSCOPY N/A 09/17/2014   Procedure: ESOPHAGOGASTRODUODENOSCOPY (EGD);  Surgeon: Inda Castle, MD;  Location: [place];  Service: Endoscopy;  Laterality: N/A;  . heart stents     1999  . INCONTINENCE SURGERY    . INGUINAL HERNIA REPAIR    . INGUINAL HERNIA REPAIR Right X 3   "last one was in the 1990's; still have hernia there now" (09/13/2014)  . LAPAROSCOPIC CHOLECYSTECTOMY    . LUMBAR DISC SURGERY  1990's?  . LUMBAR SPINE SURGERY    . removal of ovaries    . TEE WITHOUT CARDIOVERSION N/A 09/18/2014   Procedure: TRANSESOPHAGEAL ECHOCARDIOGRAM (TEE);  Surgeon: [name], MD;  Location: [place];  Service: Open Heart Surgery;  Laterality: N/A;  . UPPER GASTROINTESTINAL ENDOSCOPY      Family History  Problem Relation Age of Onset  . Coronary artery disease Mother   . Osteoporosis  Mother   . Heart disease Mother   . Stroke Mother   . Heart attack Mother   . Hypertension Mother   . Hypertension Father   . Coronary artery disease Father   . COPD Father   . Heart disease Father   . Heart attack Father   . Glaucoma Father   . Osteoporosis Father   . Emphysema Father   . Cancer Sister 34       colon  . Hypertension Sister   . Colon cancer Sister        dx in her 55's  . Breast cancer Sister   . Colon polyps Sister   . Stroke Brother   . Hypertension Brother   . Colon polyps Brother   . Stroke Maternal Grandfather   . Stroke Maternal Grandmother   . Osteoporosis Maternal  Grandmother   . Hypertension Maternal Grandmother   . Hypertension Paternal Grandmother   . Hypertension Paternal Grandfather   . Breast cancer Maternal Aunt        2 aunts-50's  . Esophageal cancer Neg Hx   . Stomach cancer Neg Hx   . Rectal cancer Neg Hx   . Pancreatic cancer Neg Hx     SOCIAL HX: nonsmoker   Current Outpatient Medications:  .  acetaminophen (TYLENOL) 500 MG tablet, Take 500 mg by mouth every 6 (six) hours as needed for headache., Disp: , Rfl:  .  amLODipine (NORVASC) 5 MG tablet, Take 1 tablet (5 mg total) by mouth daily., Disp: 90 tablet, Rfl: 3 .  aspirin EC 81 MG tablet, Take 1 tablet (81 mg total) by mouth daily., Disp: , Rfl:  .  isosorbide mononitrate (IMDUR) 30 MG 24 hr tablet, Take 1 tablet (30 mg total) by mouth daily. (Patient taking differently: Take 30 mg by mouth every morning.), Disp: 90 tablet, Rfl: 1 .  lisinopril (ZESTRIL) 10 MG tablet, TAKE 1 TABLET BY MOUTH ONCE DAILY. PLEASE CALL TO SCHEDULE APPOINTMENT FOR FUTURE REFILLS (Patient taking differently: Take 10 mg by mouth every morning.), Disp: 90 tablet, Rfl: 1 .  metoprolol succinate (TOPROL-XL) 50 MG 24 hr tablet, TAKE 1 TABLET BY MOUTH ONCE DAILY, Disp: 30 tablet, Rfl: 3 .  nitroGLYCERIN (NITROSTAT) 0.4 MG SL tablet, Place 1 tablet (0.4 mg total) under the tongue every 5 (five) minutes as  needed for chest pain. Take for up to 3 doses and then call 911 if still having chest pain., Disp: 50 tablet, Rfl: 3 .  ondansetron (ZOFRAN) 4 MG tablet, Take 1 tablet (4 mg total) by mouth every 8 (eight) hours as needed for up to 10 doses for nausea or vomiting., Disp: 10 tablet, Rfl: 0 .  rosuvastatin (CRESTOR) 40 MG tablet, TAKE 1 TABLET BY MOUTH ONCE DAILY (Patient taking differently: Take 40 mg by mouth every morning.), Disp: 90 tablet, Rfl: 1  EXAM: This was a telephone visit and thus no exam was completed.   ASSESSMENT AND PLAN:  Discussed the following assessment and plan:  Problem List Items Addressed This Visit    Hyperlipidemia    Continue crestor 40 mg daily. Check lipids in 3 months.       Hypertension    Adequate control. She will continue amlodipine 5 mg daily, imdur 30 mg daily, lisinopril 10 mg daily, and metoprolol XL 50 mg daily. Recent CMET reviewed.       Iron deficiency anemia    Released to follow-up with me on this. Repeat labs in 3 months.       Renal mass    Concerning for cancer. She will keep her appointment with [facility] urology.           I discussed the assessment and treatment plan with the patient. The patient was provided an opportunity to ask questions and all were answered. The patient agreed with the plan and demonstrated an understanding of the instructions.   The patient was advised to call back or seek an in-person evaluation if the symptoms worsen or if the condition fails to improve as anticipated.  I provided 8 minutes of non-face-to-face time during this encounter.   Tommi Rumps, MD

## 2020-05-07 NOTE — Assessment & Plan Note (Signed)
Adequate control. She will continue amlodipine 5 mg daily, imdur 30 mg daily, lisinopril 10 mg daily, and metoprolol XL 50 mg daily. Recent CMET reviewed.

## 2020-05-07 NOTE — Assessment & Plan Note (Signed)
Concerning for cancer. She will keep her appointment with [facility] urology.

## 2020-05-07 NOTE — Assessment & Plan Note (Signed)
Released to follow-up with me on this. Repeat labs in 3 months.

## 2020-05-07 NOTE — Assessment & Plan Note (Signed)
Continue crestor 40 mg daily. Check lipids in 3 months.

## 2020-05-13 ENCOUNTER — Other Ambulatory Visit: Payer: Self-pay | Admitting: Family Medicine

## 2020-05-22 ENCOUNTER — Encounter: Payer: Self-pay | Admitting: Family Medicine

## 2020-05-22 DIAGNOSIS — I2510 Atherosclerotic heart disease of native coronary artery without angina pectoris: Secondary | ICD-10-CM

## 2020-05-25 HISTORY — PX: COLONOSCOPY: SHX174

## 2020-05-25 HISTORY — PX: NEPHRECTOMY: SHX65

## 2020-05-28 ENCOUNTER — Other Ambulatory Visit: Payer: Self-pay

## 2020-05-28 ENCOUNTER — Telehealth (INDEPENDENT_AMBULATORY_CARE_PROVIDER_SITE_OTHER): Payer: Medicare Other | Admitting: Urology

## 2020-05-28 DIAGNOSIS — N200 Calculus of kidney: Secondary | ICD-10-CM

## 2020-05-28 DIAGNOSIS — N2889 Other specified disorders of kidney and ureter: Secondary | ICD-10-CM

## 2020-05-28 NOTE — Progress Notes (Signed)
Virtual Visit via Telephone Note  I connected with Debbie Delacruz on 05/28/20 at 10:45 AM EST by telephone and verified that I am speaking with the correct person using two identifiers.   Patient location: Home Provider location: [facility] Urologic Office   I discussed the limitations, risks, security and privacy concerns of performing an evaluation and management service by telephone and the availability of in person appointments. We discussed the impact of the COVID-19 pandemic on the healthcare system, and the importance of social distancing and reducing patient and provider exposure. I also discussed with the patient that there may be a patient responsible charge related to this service. The patient expressed understanding and agreed to proceed.  Reason for visit: Right renal mass  History of Present Illness: I had phone follow-up with Debbie Delacruz regarding her right renal mass.  History of nephrolithiasis.  She is a 75 year old comorbid female who presented with sepsis from urinary source in September 2021 for a 4 mm left distal ureteral stone and underwent emergent stent placement.  Follow-up ureteroscopy showed spontaneous passage of the stone.  Follow-up renal ultrasound a month later showed no silent hydronephrosis.  At the time of her original CT, a 4 cm right upper pole enhancing renal mass was incidentally found.  At our last visit we reviewed options at length through this, and I felt with her complex surgical history and challenging location of the mass adjacent to the vessels, she would benefit from evaluation at a tertiary care center.  I was able to review the notes from [facility] from Dr. Sinclair Grooms, and he recommended a robotic partial nephrectomy.  She is currently awaiting scheduling, and things may be delayed because of the Covid pandemic.  I recommended she keep scheduled follow-up with him for the robotic partial nephrectomy, and follow-up with [place] in 9 months for her history  of nephrolithiasis, and to see how things go after her partial nephrectomy.  We also are happy to do follow-up imaging after her partial nephrectomy if our office is closer to home than [facility].   Follow Up: RTC 9 months with [facility] prior Proceed with partial nephrectomy as scheduled at [facility]   I discussed the assessment and treatment plan with the patient. The patient was provided an opportunity to ask questions and all were answered. The patient agreed with the plan and demonstrated an understanding of the instructions.   The patient was advised to call back or seek an in-person evaluation if the symptoms worsen or if the condition fails to improve as anticipated.  I provided 7 minutes of non-face-to-face time during this encounter.   Sondra Come, MD

## 2020-06-11 ENCOUNTER — Other Ambulatory Visit: Payer: Self-pay | Admitting: Family Medicine

## 2020-06-13 ENCOUNTER — Encounter: Payer: Self-pay | Admitting: Cardiology

## 2020-06-13 ENCOUNTER — Ambulatory Visit (INDEPENDENT_AMBULATORY_CARE_PROVIDER_SITE_OTHER): Payer: Medicare Other | Admitting: Cardiology

## 2020-06-13 ENCOUNTER — Other Ambulatory Visit: Payer: Self-pay

## 2020-06-13 ENCOUNTER — Telehealth: Payer: Self-pay

## 2020-06-13 VITALS — BP 162/84 | HR 84 | Ht 62.0 in | Wt 168.0 lb

## 2020-06-13 DIAGNOSIS — I10 Essential (primary) hypertension: Secondary | ICD-10-CM | POA: Diagnosis not present

## 2020-06-13 DIAGNOSIS — E7800 Pure hypercholesterolemia, unspecified: Secondary | ICD-10-CM | POA: Diagnosis not present

## 2020-06-13 DIAGNOSIS — Z01818 Encounter for other preprocedural examination: Secondary | ICD-10-CM

## 2020-06-13 DIAGNOSIS — Z951 Presence of aortocoronary bypass graft: Secondary | ICD-10-CM

## 2020-06-13 NOTE — Patient Instructions (Signed)
Medication Instructions:  Your physician recommends that you continue on your current medications as directed. Please refer to the Current Medication list given to you today.  *If you need a refill on your cardiac medications before your next appointment, please call your pharmacy*   Lab Work: None Ordered If you have labs (blood work) drawn today and your tests are completely normal, you will receive your results only by: Marland Kitchen MyChart Message (if you have MyChart) OR . A paper copy in the mail If you have any lab test that is abnormal or we need to change your treatment, we will call you to review the results.   Testing/Procedures:  1.  Your physician has requested that you have an echocardiogram. Echocardiography is a painless test that uses sound waves to create images of your heart. It provides your doctor with information about the size and shape of your heart and how well your heart's chambers and valves are working. This procedure takes approximately one hour. There are no restrictions for this procedure.  2.  [place]   Your caregiver has ordered a Stress Test with nuclear imaging. The purpose of this test is to evaluate the blood supply to your heart muscle. This procedure is referred to as a "Non-Invasive Stress Test." This is because other than having an IV started in your vein, nothing is inserted or "invades" your body. Cardiac stress tests are done to find areas of poor blood flow to the heart by determining the extent of coronary artery disease (CAD). Some patients exercise on a treadmill, which naturally increases the blood flow to your heart, while others who are  unable to walk on a treadmill due to physical limitations have a pharmacologic/chemical stress agent called Lexiscan . This medicine will mimic walking on a treadmill by temporarily increasing your coronary blood flow.      PLEASE REPORT TO [facility] MEDICAL MALL ENTRANCE   THE VOLUNTEERS AT THE FIRST DESK WILL DIRECT  YOU WHERE TO GO     *Please note: these test may take anywhere between 2-4 hours to complete       Date of Procedure:_____________________________________   Arrival Time for Procedure:______________________________    PLEASE NOTIFY THE OFFICE AT LEAST 24 HOURS IN ADVANCE IF YOU ARE UNABLE TO KEEP YOUR APPOINTMENT.  [place] 24 HOURS IN ADVANCE IF YOU ARE UNABLE TO KEEP YOUR APPOINTMENT. [phone]       How to prepare for your Myoview test:    _XX___:  Hold betablocker(s) night before procedure and morning of procedure:   metoprolol succinate (TOPROL-XL)     1. Do not eat or drink after midnight  2. No caffeine for 24 hours prior to test  3. No smoking 24 hours prior to test.  4. Unless instructed otherwise, Take your medication with a small sips of water.    5.         Ladies, please do not wear dresses. Skirts or pants are appropriate. Please wear a short sleeve shirt.  6. No perfume, cologne or lotion.  7. Wear comfortable walking shoes. No heels!      Follow-Up: At [facility], you and your health needs are our priority.  As part of our continuing mission to provide you with exceptional heart care, we have created designated Provider Care Teams.  These Care Teams include your primary Cardiologist (physician) and Advanced Practice Providers (APPs -  Physician Assistants and Nurse Practitioners) who all  work together to provide you with the care you need, when you need it.  We recommend signing up for the patient portal called "MyChart".  Sign up information is provided on this After Visit Summary.  MyChart is used to connect with patients for Virtual Visits (Telemedicine).  Patients are able to view lab/test results, encounter notes, upcoming appointments, etc.  Non-urgent messages can be sent to your provider as well.   To learn more about what you can do with MyChart, go to [email or web address].    Your next  appointment:   Follow up after Echo an Myoview   The format for your next appointment:   In Person  Provider:   [name], MD   Other Instructions

## 2020-06-13 NOTE — Progress Notes (Signed)
Cardiology Office Note:    Date:  06/13/2020   ID:  Debbie Delacruz, Debbie Delacruz 08-01-1945, MRN [id number]  PCP:  Debbie Haven, MD  [facility] Cardiologist:  Debbie Sable, MD  [place] Electrophysiologist:  None   Referring MD: Debbie Haven, MD   Chief Complaint  Patient presents with  . NEW patient-CAD; Needs clearance for kidney surgery    History of Present Illness:    Debbie Delacruz is a 75 y.o. female with a hx of CAD/CABG x4 in 2016 (LIMA to LAD, SVG -OM,PDA, PL), hypertension, hyperlipidemia, iron deficiency anemia, right renal mass who presents for preop evaluation.  She was recently diagnosed with a right renal mass, evaluated by oncology, suspicious for underlying malignancy.  Resection is being considered.  She has done well since her coronary artery bypass surgery back in 2016.  Takes all her medications as prescribed.  Denies chest pain or shortness of breath.  Blood pressure is usually controlled at home.  Has not taken her BP meds this morning.  Also feels a little anxious with recent diagnosis of renal mass pending resection early next month.  Prior notes Left heart cath 05/2016 patent LIMA to LAD, patent SVG to OM, patent SVG to PDA, occluded SVG to PL. Echo 05/2016, EF 55 to 60%, basal inferior hypokinesis, impaired relaxation.  Past Medical History:  Diagnosis Date  . A-fib (Debbie [facility]acruz)   . Anemia   . Anginal pain ([place])   . Anxiety   . Arthritis   . CAD (coronary artery disease)   . Cancer (Debbie [facility]acruz)   . Carpal tunnel syndrome    bilateral  . Cataract    bil removed cataracts  . Coronary artery disease    2x stents, Dr. Clayborn Bigness  . Depression   . Dysrhythmia   . Family history of adverse reaction to anesthesia    "brother; S/P lipotripsy in [place]; transferred to [facility]; had to put him on life support for 2 days"  . GERD (gastroesophageal reflux disease)   . Headache    "weekly" (09/13/2014)  . History of blood transfusion  05/2014   "we [facility]'t figured out why I needed it yet" (09/13/2014)  . History of hiatal hernia   . Hyperglycemia   . Hyperlipidemia   . Hypertension   . Kidney stone   . Myocardial infarct (Debbie Delacruz)   . Myocardial infarct, old 1999   15 years ago  . Obesity   . PONV (postoperative nausea and vomiting)   . Renal mass   . S/P CABG x 4 09/18/2014   LIMA to LAD, SVG to PDA-dRCA sequentially, SVG to OM, EVH via right thigh   . Sleep apnea   . Sleep apnea    "don't use a mask" (09/13/2014)  . Unstable angina [facility])     Past Surgical History:  Procedure Laterality Date  . ABDOMINAL HYSTERECTOMY    . APPENDECTOMY    . BACK SURGERY    . BLADDER SURGERY    . CARDIAC CATHETERIZATION  06/2014  . CARDIAC CATHETERIZATION N/A 06/16/2016   Procedure: Left Heart Cath and Cors/Grafts Angiography;  Surgeon: Burnell Blanks, MD;  Location: [facility];  Service: Cardiovascular;  Laterality: N/A;  . CARPAL TUNNEL RELEASE Left   . CATARACT EXTRACTION W/ INTRAOCULAR LENS  IMPLANT, BILATERAL    . CHOLECYSTECTOMY    . COLONOSCOPY    . CORONARY ANGIOPLASTY WITH STENT PLACEMENT  1999   [facility] x2 stent  . CORONARY  ARTERY BYPASS GRAFT N/A 09/18/2014   Procedure: CORONARY ARTERY BYPASS GRAFTING (CABG)times four using LIMA  to LAD:SVG to  PD and DIST RCA;SVG to OM;, EVH Right thigh;  Surgeon: Purcell Nails, MD;  [facility];  Service: Open Heart Surgery;  Laterality: N/A;  . CYSTOSCOPY W/ RETROGRADES Left 03/15/2020   Procedure: CYSTOSCOPY WITH RETROGRADE PYELOGRAM;  Surgeon: Debbie Come, MD;  Location: [facility] ORS;  Service: Urology;  Laterality: Left;  . CYSTOSCOPY WITH STENT PLACEMENT Left 02/20/2020   Procedure: CYSTOSCOPY WITH STENT PLACEMENT;  Surgeon: Debbie Come, MD;  Location: [facility] ORS;  Service: Urology;  Laterality: Left;  . CYSTOSCOPY/URETEROSCOPY/HOLMIUM LASER/STENT PLACEMENT Left 03/15/2020   Procedure: CYSTOSCOPY/URETEROSCOPY;  Surgeon: Debbie Come, MD;  Location: [facility]  ORS;  Service: Urology;  Laterality: Left;  . ESOPHAGOGASTRODUODENOSCOPY N/A 09/17/2014   Procedure: ESOPHAGOGASTRODUODENOSCOPY (EGD);  Surgeon: Debbie Meckel, MD;  Location: [facility] ENDOSCOPY;  Service: Endoscopy;  Laterality: N/A;  . heart stents     1999  . INCONTINENCE SURGERY    . INGUINAL HERNIA REPAIR    . INGUINAL HERNIA REPAIR Right X 3   "last one was in the 1990's; still have hernia there now" (09/13/2014)  . LAPAROSCOPIC CHOLECYSTECTOMY    . LUMBAR DISC SURGERY  1990's?  . LUMBAR SPINE SURGERY    . removal of ovaries    . TEE WITHOUT CARDIOVERSION N/A 09/18/2014   Procedure: TRANSESOPHAGEAL ECHOCARDIOGRAM (TEE);  Surgeon: Purcell Nails, MD;  Location: [facility] OR;  Service: Open Heart Surgery;  Laterality: N/A;  . UPPER GASTROINTESTINAL ENDOSCOPY      Current Medications: Current Meds  Medication Sig  . acetaminophen (TYLENOL) 500 MG tablet Take 500 mg by mouth every 6 (six) hours as needed for headache.  [facility] amLODipine (NORVASC) 5 MG tablet TAKE 1 TABLET BY MOUTH ONCE DAILY  . aspirin EC 81 MG tablet Take 1 tablet (81 mg total) by mouth daily.  . isosorbide mononitrate (IMDUR) 30 MG 24 hr tablet TAKE 1 TABLET BY MOUTH ONCE DAILY  . lisinopril (ZESTRIL) 10 MG tablet TAKE 1 TABLET BY MOUTH ONCE DAILY. PLEASE CALL TO SCHEDULE APPOINTMENT FOR FUTURE REFILLS  . metoprolol succinate (TOPROL-XL) 50 MG 24 hr tablet TAKE 1 TABLET BY MOUTH ONCE DAILY  . nitroGLYCERIN (NITROSTAT) 0.4 MG SL tablet Place 1 tablet (0.4 mg total) under the tongue every 5 (five) minutes as needed for chest pain. Take for up to 3 doses and then call 911 if still having chest pain.  [facility] ondansetron (ZOFRAN) 4 MG tablet Take 1 tablet (4 mg total) by mouth every 8 (eight) hours as needed for up to 10 doses for nausea or vomiting.  . rosuvastatin (CRESTOR) 40 MG tablet TAKE 1 TABLET BY MOUTH ONCE DAILY     Allergies:   Ferrous sulfate [ferrous fumarate] and Lipitor [atorvastatin]   Social History   Socioeconomic  History  . Marital status: Married    Spouse name: Not on file  . Number of children: Not on file  . Years of education: Not on file  . Highest education level: Not on file  Occupational History  . Not on file  Tobacco Use  . Smoking status: Never Smoker  . Smokeless tobacco: Never Used  Vaping Use  . Vaping Use: Never used  Substance and Sexual Activity  . Alcohol use: Not Currently    Alcohol/week: 0.0 standard drinks  . Drug use: No  . Sexual activity: Not Currently    Birth control/protection: Post-menopausal  Other Topics Concern  . Not on file  Social History Narrative   ** Merged History Encounter **       Lives in [place] with 51 YO nephew and husband. 3 dogs outside.  Work - Doctor, general practice, [employer]  Diet - regular, limited meat  Exercise - walks some   Social Determinants of Radio broadcast assistant Strain: Not on file  Food Insecurity: Not on file  Transportation Needs: Not on file  Physical Activity: Not on file  Stress: Not on file  Social Connections: Not on file     Family History: The patient's family history includes Breast cancer in her maternal aunt and sister; COPD in her father; Cancer (age of onset: 48) in her sister; Colon cancer in her sister; Colon polyps in her brother and sister; Coronary artery disease in her father and mother; Emphysema in her father; Glaucoma in her father; Heart attack in her father and mother; Heart disease in her father and mother; Hypertension in her brother, father, maternal grandmother, mother, paternal grandfather, paternal grandmother, and sister; Osteoporosis in her father, maternal grandmother, and mother; Stroke in her brother, maternal grandfather, maternal grandmother, and mother. There is no history of Esophageal cancer, Stomach cancer, Rectal cancer, or Pancreatic cancer.  ROS:   Please see the history of present illness.     All other systems reviewed and are negative.  EKGs/Labs/Other  Studies Reviewed:    The following studies were reviewed today:   EKG:  EKG is  ordered today.  The ekg ordered today demonstrates sinus rhythm, occasional PVCs.  Possible old inferior infarct.  Recent Labs: 07/24/2019: TSH 2.02 02/21/2020: Magnesium 2.9 04/09/2020: ALT 14; BUN 29; Creatinine, Ser 0.89; Hemoglobin 13.3; Platelets 184; Potassium 4.4; Sodium 139  Recent Lipid Panel    Component Value Date/Time   CHOL 168 09/19/2019 0808   TRIG 118.0 09/19/2019 0808   HDL 40.60 09/19/2019 0808   CHOLHDL 4 09/19/2019 0808   VLDL 23.6 09/19/2019 0808   LDLCALC 104 (H) 09/19/2019 0808   LDLDIRECT 222.0 10/09/2015 0823     Risk Assessment/Calculations:      Physical Exam:    VS:  BP (!) 162/84 (BP Location: Right Arm, Patient Position: Sitting, Cuff Size: Large)   Pulse 84   Ht 5\' 2"  (1.575 m)   Wt 168 lb (76.2 kg)   SpO2 98%   BMI 30.73 kg/m     Wt Readings from Last 3 Encounters:  06/13/20 168 lb (76.2 kg)  05/07/20 168 lb (76.2 kg)  04/11/20 168 lb (76.2 kg)     GEN:  Well nourished, well developed in no acute distress HEENT: Normal NECK: No JVD; No carotid bruits LYMPHATICS: No lymphadenopathy CARDIAC: RRR, no murmurs, rubs, gallops RESPIRATORY:  Clear to auscultation without rales, wheezing or rhonchi  ABDOMEN: Soft, non-tender, non-distended MUSCULOSKELETAL:  No edema; No deformity  SKIN: Warm and dry NEUROLOGIC:  Alert and oriented x 3 PSYCHIATRIC:  Normal affect   ASSESSMENT:    1. Pre-op evaluation   2. Hx of CABG   3. Primary hypertension   4. Pure hypercholesterolemia    PLAN:    In order of problems listed above:  1. Patient presenting for preop evaluation prior to renal mass resection.  Procedure planned to be done under general anesthesia.  Due to history of CAD/CABG, obtain echo to evaluate cardiac function, Myoview to evaluate presence of ischemia.  Patient currently asymptomatic. 2. History of CABG, continue aspirin, statin,  beta-blocker. 3.  Hypertension, has not taken BP meds this AM.  Also anxious likely causing elevated BP today.  Continue current meds, monitor for now. 4. Hyperlipidemia, continue high intensity statin/Crestor.  Follow-up after echo and Myoview.   Shared Decision Making/Informed Consent The risks [chest pain, shortness of breath, cardiac arrhythmias, dizziness, blood pressure fluctuations, myocardial infarction, stroke/transient ischemic attack, nausea, vomiting, allergic reaction, radiation exposure, metallic taste sensation and life-threatening complications (estimated to be 1 in 10,000)], benefits (risk stratification, diagnosing coronary artery disease, treatment guidance) and alternatives of a nuclear stress test were discussed in detail with Ms. Sundberg and she agrees to proceed.     Medication Adjustments/Labs and Tests Ordered: Current medicines are reviewed at length with the patient today.  Concerns regarding medicines are outlined above.  Orders Placed This Encounter  Procedures  . NM Myocar Multi W/Spect W/Wall Motion / EF  . EKG 12-Lead  . ECHOCARDIOGRAM COMPLETE   No orders of the defined types were placed in this encounter.   Patient Instructions  Medication Instructions:  Your physician recommends that you continue on your current medications as directed. Please refer to the Current Medication list given to you today.  *If you need a refill on your cardiac medications before your next appointment, please call your pharmacy*   Lab Work: None Ordered If you have labs (blood work) drawn today and your tests are completely normal, you will receive your results only by: [facility] MyChart Message (if you have MyChart) OR . A paper copy in the mail If you have any lab test that is abnormal or we need to change your treatment, we will call you to review the results.   Testing/Procedures:  1.  Your physician has requested that you have an echocardiogram. Echocardiography is a  painless test that uses sound waves to create images of your heart. It provides your doctor with information about the size and shape of your heart and how well your heart's chambers and valves are working. This procedure takes approximately one hour. There are no restrictions for this procedure.  2.  [place]   Your caregiver has ordered a Stress Test with nuclear imaging. The purpose of this test is to evaluate the blood supply to your heart muscle. This procedure is referred to as a "Non-Invasive Stress Test." This is because other than having an IV started in your vein, nothing is inserted or "invades" your body. Cardiac stress tests are done to find areas of poor blood flow to the heart by determining the extent of coronary artery disease (CAD). Some patients exercise on a treadmill, which naturally increases the blood flow to your heart, while others who are  unable to walk on a treadmill due to physical limitations have a pharmacologic/chemical stress agent called Lexiscan . This medicine will mimic walking on a treadmill by temporarily increasing your coronary blood flow.      PLEASE REPORT TO [facility] MEDICAL MALL ENTRANCE   THE VOLUNTEERS AT THE FIRST DESK WILL DIRECT YOU WHERE TO GO     *Please note: these test may take anywhere between 2-4 hours to complete       Date of Procedure:_____________________________________   Arrival Time for Procedure:______________________________    PLEASE NOTIFY THE OFFICE AT LEAST 24 HOURS IN ADVANCE IF YOU ARE UNABLE TO KEEP YOUR APPOINTMENT.  [place] 24 HOURS IN ADVANCE IF YOU ARE UNABLE TO KEEP YOUR APPOINTMENT. [phone]       How to  prepare for your Myoview test:    _XX___:  Hold betablocker(s) night before procedure and morning of procedure:   metoprolol succinate (TOPROL-XL)     1. Do not eat or drink after midnight  2. No caffeine for 24 hours prior to test  3. No smoking 24  hours prior to test.  4. Unless instructed otherwise, Take your medication with a small sips of water.    5.         Ladies, please do not wear dresses. Skirts or pants are appropriate. Please wear a short sleeve shirt.  6. No perfume, cologne or lotion.  7. Wear comfortable walking shoes. No heels!      Follow-Up: At [facility], you and your health needs are our priority.  As part of our continuing mission to provide you with exceptional heart care, we have created designated Provider Care Teams.  These Care Teams include your primary Cardiologist (physician) and Advanced Practice Providers (APPs -  Physician Assistants and Nurse Practitioners) who all work together to provide you with the care you need, when you need it.  We recommend signing up for the patient portal called "MyChart".  Sign up information is provided on this After Visit Summary.  MyChart is used to connect with patients for Virtual Visits (Telemedicine).  Patients are able to view lab/test results, encounter notes, upcoming appointments, etc.  Non-urgent messages can be sent to your provider as well.   To learn more about what you can do with MyChart, go to [email or web address].    Your next appointment:   Follow up after Echo an Myoview   The format for your next appointment:   In Person  Provider:   [name], MD   Other Instructions       Signed, Debbie Sable, MD  06/13/2020 12:29 PM    [place]

## 2020-06-13 NOTE — Telephone Encounter (Signed)
Patient seen today in office for pre-op eval. An Echo and a MV have been ordered. When results are received, a surgical clearance can be faxed to: Leighton Ruff. [facility] [address] [place],  [address] Ph# [phone] Fax# [phone]

## 2020-06-20 DIAGNOSIS — C641 Malignant neoplasm of right kidney, except renal pelvis: Secondary | ICD-10-CM | POA: Diagnosis not present

## 2020-06-20 DIAGNOSIS — I2510 Atherosclerotic heart disease of native coronary artery without angina pectoris: Secondary | ICD-10-CM | POA: Diagnosis not present

## 2020-06-20 DIAGNOSIS — E785 Hyperlipidemia, unspecified: Secondary | ICD-10-CM | POA: Diagnosis not present

## 2020-06-20 DIAGNOSIS — I10 Essential (primary) hypertension: Secondary | ICD-10-CM | POA: Diagnosis not present

## 2020-06-20 DIAGNOSIS — N2889 Other specified disorders of kidney and ureter: Secondary | ICD-10-CM | POA: Diagnosis not present

## 2020-06-25 ENCOUNTER — Other Ambulatory Visit: Payer: Self-pay

## 2020-06-25 ENCOUNTER — Ambulatory Visit (INDEPENDENT_AMBULATORY_CARE_PROVIDER_SITE_OTHER): Payer: Medicare Other

## 2020-06-25 DIAGNOSIS — Z951 Presence of aortocoronary bypass graft: Secondary | ICD-10-CM | POA: Diagnosis not present

## 2020-06-25 DIAGNOSIS — Z01810 Encounter for preprocedural cardiovascular examination: Secondary | ICD-10-CM | POA: Diagnosis not present

## 2020-06-25 DIAGNOSIS — Z01818 Encounter for other preprocedural examination: Secondary | ICD-10-CM

## 2020-06-25 LAB — ECHOCARDIOGRAM COMPLETE
AR max vel: 2.37 cm2
AV Area VTI: 2.49 cm2
AV Area mean vel: 2.35 cm2
AV Mean grad: 4 mmHg
AV Peak grad: 7.3 mmHg
Ao pk vel: 1.35 m/s
Area-P 1/2: 3.65 cm2
Calc EF: 51.5 %
S' Lateral: 2.8 cm
Single Plane A2C EF: 44.7 %
Single Plane A4C EF: 50.6 %

## 2020-06-26 ENCOUNTER — Other Ambulatory Visit: Payer: Self-pay

## 2020-06-26 ENCOUNTER — Encounter
Admission: RE | Admit: 2020-06-26 | Discharge: 2020-06-26 | Disposition: A | Discharge status: Home / Self Care | Payer: Medicare Other | Source: Ambulatory Visit | Attending: Cardiology | Admitting: Cardiology

## 2020-06-26 DIAGNOSIS — Z01818 Encounter for other preprocedural examination: Secondary | ICD-10-CM | POA: Diagnosis not present

## 2020-06-26 DIAGNOSIS — Z951 Presence of aortocoronary bypass graft: Secondary | ICD-10-CM | POA: Diagnosis not present

## 2020-06-26 LAB — NM MYOCAR MULTI W/SPECT W/WALL MOTION / EF
Estimated workload: 1 METS
Exercise duration (min): 0 min
Exercise duration (sec): 0 s
LV dias vol: 55 mL (ref 46–106)
LV sys vol: 21 mL
MPHR: 148 {beats}/min
Peak HR: 123 {beats}/min
Percent HR: 83 %
Rest HR: 74 {beats}/min
SDS: 5
SRS: 5
SSS: 5
TID: 1.03

## 2020-06-26 MED ORDER — TECHNETIUM TC 99M TETROFOSMIN IV KIT
10.0000 | PACK | Freq: Once | INTRAVENOUS | Status: AC | PRN
  Administered 2020-06-26: 10.45 via INTRAVENOUS

## 2020-06-26 MED ORDER — REGADENOSON 0.4 MG/5ML IV SOLN
0.4000 mg | Freq: Once | INTRAVENOUS | Status: AC
  Administered 2020-06-26: 0.4 mg via INTRAVENOUS

## 2020-06-26 MED ORDER — TECHNETIUM TC 99M TETROFOSMIN IV KIT
32.0100 | PACK | Freq: Once | INTRAVENOUS | Status: AC | PRN
  Administered 2020-06-26: 32.01 via INTRAVENOUS

## 2020-06-28 ENCOUNTER — Ambulatory Visit: Payer: Medicare Other | Admitting: Cardiology

## 2020-07-01 ENCOUNTER — Telehealth: Payer: Self-pay | Admitting: Cardiology

## 2020-07-01 NOTE — Telephone Encounter (Signed)
Please call [facility] to advise if patient is cleared for surgery.

## 2020-07-01 NOTE — Telephone Encounter (Signed)
Called and left [facility] at [place] [facility] a VM that patient was cleared for surgery, and encouraged a callback if there were any questions or concerns.

## 2020-07-04 DIAGNOSIS — Z20822 Contact with and (suspected) exposure to covid-19: Secondary | ICD-10-CM | POA: Diagnosis present

## 2020-07-04 DIAGNOSIS — D49511 Neoplasm of unspecified behavior of right kidney: Secondary | ICD-10-CM | POA: Diagnosis not present

## 2020-07-04 DIAGNOSIS — I10 Essential (primary) hypertension: Secondary | ICD-10-CM | POA: Diagnosis present

## 2020-07-04 DIAGNOSIS — E785 Hyperlipidemia, unspecified: Secondary | ICD-10-CM | POA: Diagnosis present

## 2020-07-04 DIAGNOSIS — N2889 Other specified disorders of kidney and ureter: Secondary | ICD-10-CM | POA: Diagnosis not present

## 2020-07-04 DIAGNOSIS — K660 Peritoneal adhesions (postprocedural) (postinfection): Secondary | ICD-10-CM | POA: Diagnosis not present

## 2020-07-04 DIAGNOSIS — C641 Malignant neoplasm of right kidney, except renal pelvis: Secondary | ICD-10-CM | POA: Diagnosis present

## 2020-07-26 DIAGNOSIS — Z09 Encounter for follow-up examination after completed treatment for conditions other than malignant neoplasm: Secondary | ICD-10-CM | POA: Diagnosis not present

## 2020-07-26 DIAGNOSIS — C641 Malignant neoplasm of right kidney, except renal pelvis: Secondary | ICD-10-CM | POA: Diagnosis not present

## 2020-07-30 ENCOUNTER — Other Ambulatory Visit: Payer: Self-pay | Admitting: Family Medicine

## 2020-08-06 ENCOUNTER — Other Ambulatory Visit: Payer: Medicare Other

## 2020-09-02 ENCOUNTER — Other Ambulatory Visit: Payer: Self-pay | Admitting: Family Medicine

## 2020-09-02 DIAGNOSIS — Z1231 Encounter for screening mammogram for malignant neoplasm of breast: Secondary | ICD-10-CM

## 2020-09-16 ENCOUNTER — Other Ambulatory Visit: Payer: Self-pay | Admitting: Family Medicine

## 2020-09-18 ENCOUNTER — Other Ambulatory Visit: Payer: Self-pay

## 2020-09-18 ENCOUNTER — Other Ambulatory Visit (INDEPENDENT_AMBULATORY_CARE_PROVIDER_SITE_OTHER): Payer: Medicare Other

## 2020-09-18 DIAGNOSIS — E7800 Pure hypercholesterolemia, unspecified: Secondary | ICD-10-CM | POA: Diagnosis not present

## 2020-09-18 DIAGNOSIS — D509 Iron deficiency anemia, unspecified: Secondary | ICD-10-CM

## 2020-09-18 LAB — CBC
HCT: 36.9 % (ref 36.0–46.0)
Hemoglobin: 12.1 g/dL (ref 12.0–15.0)
MCHC: 32.7 g/dL (ref 30.0–36.0)
MCV: 89 fl (ref 78.0–100.0)
Platelets: 204 10*3/uL (ref 150.0–400.0)
RBC: 4.15 Mil/uL (ref 3.87–5.11)
RDW: 14.3 % (ref 11.5–15.5)
WBC: 4.6 10*3/uL (ref 4.0–10.5)

## 2020-09-18 LAB — IBC + FERRITIN
Ferritin: 7 ng/mL — ABNORMAL LOW (ref 10.0–291.0)
Iron: 36 ug/dL — ABNORMAL LOW (ref 42–145)
Saturation Ratios: 8.3 % — ABNORMAL LOW (ref 20.0–50.0)
Transferrin: 311 mg/dL (ref 212.0–360.0)

## 2020-09-18 LAB — LIPID PANEL
Cholesterol: 165 mg/dL (ref 0–200)
HDL: 46.1 mg/dL (ref >39.00)
LDL Cholesterol: 102 mg/dL — ABNORMAL HIGH (ref 0–99)
NonHDL: 118.58
Total CHOL/HDL Ratio: 4
Triglycerides: 83 mg/dL (ref 0.0–149.0)
VLDL: 16.6 mg/dL (ref 0.0–40.0)

## 2020-09-18 LAB — COMPREHENSIVE METABOLIC PANEL
ALT: 12 U/L (ref 0–35)
AST: 16 U/L (ref 0–37)
Albumin: 4.1 g/dL (ref 3.5–5.2)
Alkaline Phosphatase: 54 U/L (ref 39–117)
BUN: 37 mg/dL — ABNORMAL HIGH (ref 6–23)
CO2: 24 mEq/L (ref 19–32)
Calcium: 9.4 mg/dL (ref 8.4–10.5)
Chloride: 106 mEq/L (ref 96–112)
Creatinine, Ser: 1.67 mg/dL — ABNORMAL HIGH (ref 0.40–1.20)
GFR: 30.35 mL/min — ABNORMAL LOW (ref >60.00)
Glucose, Bld: 102 mg/dL — ABNORMAL HIGH (ref 70–99)
Potassium: 5.5 mEq/L — ABNORMAL HIGH (ref 3.5–5.1)
Sodium: 137 mEq/L (ref 135–145)
Total Bilirubin: 0.4 mg/dL (ref 0.2–1.2)
Total Protein: 7 g/dL (ref 6.0–8.3)

## 2020-09-20 ENCOUNTER — Telehealth: Payer: Self-pay | Admitting: Family Medicine

## 2020-09-20 DIAGNOSIS — N179 Acute kidney failure, unspecified: Secondary | ICD-10-CM

## 2020-09-20 NOTE — Telephone Encounter (Signed)
I went ahead and ordered the labs for [facility].  She should be able to go and have them on Monday.

## 2020-09-20 NOTE — Telephone Encounter (Signed)
PT called wanting to know if on Monday they could go to [facility] early in the morning instead of waiting till the afternoon for Labs.

## 2020-09-20 NOTE — Telephone Encounter (Signed)
I called the patient and informed her that the labs was ordered for [facility] and she can go there on Monday morning and she understood.  [facility]bie [facility],cma

## 2020-09-20 NOTE — Telephone Encounter (Signed)
Patient has to have a recheck of her labs and she called and stated "PT called wanting to know if on Monday they could go to [facility] early in the morning instead of waiting till the afternoon for Labs."  the labs have not been ordered yet so can you order for [facility] let me know.   Lejuan Botto,cma

## 2020-09-20 NOTE — Addendum Note (Signed)
Addended by: Caryl Bis Nazair Fortenberry G on: 09/20/2020 12:52 PM   Modules accepted: Orders

## 2020-09-23 ENCOUNTER — Other Ambulatory Visit: Payer: Medicare Other

## 2020-09-23 DIAGNOSIS — N179 Acute kidney failure, unspecified: Secondary | ICD-10-CM | POA: Diagnosis not present

## 2020-09-24 ENCOUNTER — Other Ambulatory Visit: Payer: Self-pay | Admitting: Family Medicine

## 2020-09-24 DIAGNOSIS — E785 Hyperlipidemia, unspecified: Secondary | ICD-10-CM

## 2020-09-24 LAB — BASIC METABOLIC PANEL
BUN/Creatinine Ratio: 19 (ref 12–28)
BUN: 30 mg/dL — ABNORMAL HIGH (ref 8–27)
CO2: 21 mmol/L (ref 20–29)
Calcium: 9.4 mg/dL (ref 8.7–10.3)
Chloride: 103 mmol/L (ref 96–106)
Creatinine, Ser: 1.55 mg/dL — ABNORMAL HIGH (ref 0.57–1.00)
Glucose: 107 mg/dL — ABNORMAL HIGH (ref 65–99)
Potassium: 5 mmol/L (ref 3.5–5.2)
Sodium: 137 mmol/L (ref 134–144)
eGFR: 35 mL/min/{1.73_m2} — ABNORMAL LOW (ref >59)

## 2020-09-24 MED ORDER — EZETIMIBE 10 MG PO TABS
10.0000 mg | ORAL_TABLET | Freq: Every day | ORAL | 3 refills | Status: DC
Start: 2020-09-24 — End: 2022-02-04

## 2020-09-27 NOTE — Progress Notes (Deleted)
[place]  [phone] [phone]  ID: [name] OB: [birth year]  MR#: [id number]  [id number]  Patient Care Team: [name], MD as PCP - General (Family Medicine) [name], MD as PCP - Cardiology (Cardiology) [name], MD (Internal Medicine) [name], MD as Consulting Physician (Cardiothoracic Surgery) [name], MD as Consulting Physician (Cardiology) Lloyd Huger, MD as Consulting Physician (Oncology)   CHIEF COMPLAINT: Iron deficiency anemia, right renal mass.  INTERVAL HISTORY: Patient agreed to video assisted telemedicine visit for further evaluation and discussion of her laboratory results.  She currently feels well and is asymptomatic.  She does not complain of any weakness or fatigue.  She has no neurologic complaints. She denies any recent fevers or illnesses. She has a good appetite and denies weight loss.  She denies any chest pain, shortness of breath, cough, or hemoptysis.  She denies any nausea, vomiting, constipation, or diarrhea. She denies any melena or hematochezia. She has no urinary complaints.  Patient offers no specific complaints today.  REVIEW OF SYSTEMS:   Review of Systems  Constitutional: Negative.  Negative for fever, malaise/fatigue and weight loss.  Respiratory: Negative.  Negative for cough and shortness of breath.   Cardiovascular: Negative.  Negative for chest pain and leg swelling.  Gastrointestinal: Negative.  Negative for abdominal pain, blood in stool and melena.  Genitourinary: Negative.  Negative for hematuria.  Musculoskeletal: Negative.  Negative for joint pain.  Skin: Negative.  Negative for rash.  Neurological: Negative.  Negative for dizziness, sensory change, focal weakness, weakness and headaches.  Psychiatric/Behavioral: Negative.  The patient is not nervous/anxious and does not have insomnia.     As per HPI. Otherwise, a complete  review of systems is negative.  PAST MEDICAL HISTORY: Past Medical History:  Diagnosis Date  . A-fib ([place])   . Anemia   . Anginal pain ([place])   . Anxiety   . Arthritis   . CAD (coronary artery disease)   . Cancer ([place])   . Carpal tunnel syndrome    bilateral  . Cataract    bil removed cataracts  . Coronary artery disease    2x stents, Dr. Clayborn Bigness  . Depression   . Dysrhythmia   . Family history of adverse reaction to anesthesia    "brother; S/P lipotripsy in [place]; transferred to [facility]; had to put him on life support for 2 days"  . GERD (gastroesophageal reflux disease)   . Headache    "weekly" (09/13/2014)  . History of blood transfusion 05/2014   "we haven't figured out why I needed it yet" (09/13/2014)  . History of hiatal hernia   . Hyperglycemia   . Hyperlipidemia   . Hypertension   . Kidney stone   . Myocardial infarct ([place])   . Myocardial infarct, old 1999   15 years ago  . Obesity   . PONV (postoperative nausea and vomiting)   . Renal mass   . S/P CABG x 4 09/18/2014   LIMA to LAD, SVG to PDA-dRCA sequentially, SVG to OM, EVH via right thigh   . Sleep apnea   . Sleep apnea    "don't use a mask" (09/13/2014)  . Unstable angina ([place])     PAST SURGICAL HISTORY: Past Surgical History:  Procedure Laterality Date  . ABDOMINAL HYSTERECTOMY    . APPENDECTOMY    . BACK SURGERY    . BLADDER SURGERY    . CARDIAC CATHETERIZATION  06/2014  .  CARDIAC CATHETERIZATION N/A 06/16/2016   Procedure: Left Heart Cath and Cors/Grafts Angiography;  Surgeon: Burnell Blanks, MD;  Location: [facility];  Service: Cardiovascular;  Laterality: N/A;  . CARPAL TUNNEL RELEASE Left   . CATARACT EXTRACTION W/ INTRAOCULAR LENS  IMPLANT, BILATERAL    . CHOLECYSTECTOMY    . COLONOSCOPY    . CORONARY ANGIOPLASTY WITH STENT PLACEMENT  1999   [facility] x2 stent  . CORONARY ARTERY BYPASS GRAFT N/A 09/18/2014   Procedure: CORONARY ARTERY BYPASS GRAFTING (CABG)times four  using LIMA  to LAD:SVG to  PD and DIST RCA;SVG to OM;, EVH Right thigh;  Surgeon: [name], MD;  Location: [facility];  Service: Open Heart Surgery;  Laterality: N/A;  . CYSTOSCOPY W/ RETROGRADES Left 03/15/2020   Procedure: CYSTOSCOPY WITH RETROGRADE PYELOGRAM;  Surgeon: Billey Co, MD;  Location: [facility];  Service: Urology;  Laterality: Left;  . CYSTOSCOPY WITH STENT PLACEMENT Left 02/20/2020   Procedure: CYSTOSCOPY WITH STENT PLACEMENT;  Surgeon: Billey Co, MD;  Location: [facility];  Service: Urology;  Laterality: Left;  . CYSTOSCOPY/URETEROSCOPY/HOLMIUM LASER/STENT PLACEMENT Left 03/15/2020   Procedure: CYSTOSCOPY/URETEROSCOPY;  Surgeon: Billey Co, MD;  Location: [facility];  Service: Urology;  Laterality: Left;  . ESOPHAGOGASTRODUODENOSCOPY N/A 09/17/2014   Procedure: ESOPHAGOGASTRODUODENOSCOPY (EGD);  Surgeon: Inda Castle, MD;  Location: [facility];  Service: Endoscopy;  Laterality: N/A;  . heart stents     1999  . INCONTINENCE SURGERY    . INGUINAL HERNIA REPAIR    . INGUINAL HERNIA REPAIR Right X 3   "last one was in the 1990's; still have hernia there now" (09/13/2014)  . LAPAROSCOPIC CHOLECYSTECTOMY    . LUMBAR DISC SURGERY  1990's?  . LUMBAR SPINE SURGERY    . removal of ovaries    . TEE WITHOUT CARDIOVERSION N/A 09/18/2014   Procedure: TRANSESOPHAGEAL ECHOCARDIOGRAM (TEE);  Surgeon: [name], MD;  Location: [facility];  Service: Open Heart Surgery;  Laterality: N/A;  . UPPER GASTROINTESTINAL ENDOSCOPY      FAMILY HISTORY: Family History  Problem Relation Age of Onset  . Coronary artery disease Mother   . Osteoporosis Mother   . Heart disease Mother   . Stroke Mother   . Heart attack Mother   . Hypertension Mother   . Hypertension Father   . Coronary artery disease Father   . COPD Father   . Heart disease Father   . Heart attack Father   . Glaucoma Father   . Osteoporosis Father   . Emphysema Father   . Cancer Sister 54       colon  .  Hypertension Sister   . Colon cancer Sister        dx in her 71's  . Breast cancer Sister   . Colon polyps Sister   . Stroke Brother   . Hypertension Brother   . Colon polyps Brother   . Stroke Maternal Grandfather   . Stroke Maternal Grandmother   . Osteoporosis Maternal Grandmother   . Hypertension Maternal Grandmother   . Hypertension Paternal Grandmother   . Hypertension Paternal Grandfather   . Breast cancer Maternal Aunt        2 aunts-50's  . Esophageal cancer Neg Hx   . Stomach cancer Neg Hx   . Rectal cancer Neg Hx   . Pancreatic cancer Neg Hx     ADVANCED DIRECTIVES (Y/N):  N  HEALTH MAINTENANCE: Social History   Tobacco Use  .  Smoking status: Never Smoker  . Smokeless tobacco: Never Used  Vaping Use  . Vaping Use: Never used  Substance Use Topics  . Alcohol use: Not Currently    Alcohol/week: 0.0 standard drinks  . Drug use: No     Colonoscopy:  PAP:  Bone density:  Lipid panel:  Allergies  Allergen Reactions  . Ferrous Sulfate [Ferrous Fumarate] Other (See Comments)    STOMACH ISSUES  . Lipitor [Atorvastatin] Other (See Comments)    myalgia    Current Outpatient Medications  Medication Sig Dispense Refill  . acetaminophen (TYLENOL) 500 MG tablet Take 500 mg by mouth every 6 (six) hours as needed for headache.    Marland Kitchen amLODipine (NORVASC) 5 MG tablet TAKE 1 TABLET BY MOUTH ONCE DAILY 90 tablet 3  . aspirin EC 81 MG tablet Take 1 tablet (81 mg total) by mouth daily.    Marland Kitchen ezetimibe (ZETIA) 10 MG tablet Take 1 tablet (10 mg total) by mouth daily. 90 tablet 3  . isosorbide mononitrate (IMDUR) 30 MG 24 hr tablet TAKE 1 TABLET BY MOUTH ONCE DAILY 90 tablet 1  . lisinopril (ZESTRIL) 10 MG tablet Take 1 tablet (10 mg total) by mouth daily. 90 tablet 1  . metoprolol succinate (TOPROL-XL) 50 MG 24 hr tablet TAKE 1 TABLET BY MOUTH ONCE DAILY 30 tablet 3  . nitroGLYCERIN (NITROSTAT) 0.4 MG SL tablet Place 1 tablet (0.4 mg total) under the tongue every 5 (five)  minutes as needed for chest pain. Take for up to 3 doses and then call 911 if still having chest pain. 50 tablet 3  . ondansetron (ZOFRAN) 4 MG tablet Take 1 tablet (4 mg total) by mouth every 8 (eight) hours as needed for up to 10 doses for nausea or vomiting. 10 tablet 0  . rosuvastatin (CRESTOR) 40 MG tablet TAKE 1 TABLET BY MOUTH ONCE DAILY 90 tablet 1   No current facility-administered medications for this visit.    OBJECTIVE: There were no vitals filed for this visit.   There is no height or weight on file to calculate BMI.    ECOG FS:0 - Asymptomatic  General: Well-developed, well-nourished, no acute distress. Eyes: Pink conjunctiva, anicteric sclera. HEENT: Normocephalic, moist mucous membranes. Lungs: No audible wheezing or coughing. Heart: Regular rate and rhythm. Abdomen: Soft, nontender, no obvious distention. Musculoskeletal: No edema, cyanosis, or clubbing. Neuro: Alert, answering all questions appropriately. Cranial nerves grossly intact. Skin: No rashes or petechiae noted. Psych: Normal affect.  LAB RESULTS:  Lab Results  Component Value Date   NA 137 09/23/2020   K 5.0 09/23/2020   CL 103 09/23/2020   CO2 21 09/23/2020   GLUCOSE 107 (H) 09/23/2020   BUN 30 (H) 09/23/2020   CREATININE 1.55 (H) 09/23/2020   CALCIUM 9.4 09/23/2020   PROT 7.0 09/18/2020   ALBUMIN 4.1 09/18/2020   AST 16 09/18/2020   ALT 12 09/18/2020   ALKPHOS 54 09/18/2020   BILITOT 0.4 09/18/2020   GFRNONAA >60 04/09/2020   GFRAA >60 02/24/2020    Lab Results  Component Value Date   WBC 4.6 09/18/2020   NEUTROABS 2.3 04/09/2020   HGB 12.1 09/18/2020   HCT 36.9 09/18/2020   MCV 89.0 09/18/2020   PLT 204.0 09/18/2020   Lab Results  Component Value Date   IRON 36 (L) 09/18/2020   TIBC 391 04/09/2020   IRONPCTSAT 8.3 (L) 09/18/2020   Lab Results  Component Value Date   FERRITIN 7.0 (L) 09/18/2020  STUDIES: No results found.  ASSESSMENT: Iron deficiency anemia.  PLAN:     1. Iron deficiency anemia: Patient's hemoglobin iron stores continue to be within normal limits. Previously, she reported she cannot tolerate oral iron supplementation. The remainder of her laboratory work is either negative or within normal limits.  Patient had a normal endoscopy in May 2017 and a normal colonoscopy in August 2014.  Patient will likely require repeat colonoscopy in the near future.  She does not require additional Feraheme today.  She last received IV Feraheme on May 08, 2019.  No intervention is needed at this time.  After discussion with the patient, is agreed upon that no further follow-up is necessary.  Please refer her back if there are any questions or concerns. 2.  Right renal mass: Highly suspicious for underlying malignancy.  Patient has been referred to [facility] by urology for right partial nephrectomy.  Continue follow-up and monitoring per them.  Please refer patient back if there is any need for additional treatment.   Patient expressed understanding and was in agreement with this plan. She also understands that She can call clinic at any time with any questions, concerns, or complaints.    Lloyd Huger, MD   09/27/2020 6:36 AM

## 2020-09-30 ENCOUNTER — Encounter: Payer: Self-pay | Admitting: Family Medicine

## 2020-09-30 DIAGNOSIS — R7989 Other specified abnormal findings of blood chemistry: Secondary | ICD-10-CM

## 2020-10-01 ENCOUNTER — Emergency Department: Payer: Medicare Other

## 2020-10-01 ENCOUNTER — Other Ambulatory Visit: Payer: Self-pay

## 2020-10-01 ENCOUNTER — Inpatient Hospital Stay: Payer: Medicare Other

## 2020-10-01 ENCOUNTER — Inpatient Hospital Stay: Payer: Medicare Other | Admitting: Oncology

## 2020-10-01 ENCOUNTER — Encounter: Payer: Self-pay | Admitting: Family Medicine

## 2020-10-01 ENCOUNTER — Inpatient Hospital Stay
Admission: EM | Admit: 2020-10-01 | Discharge: 2020-10-04 | DRG: 389 | Disposition: A | Discharge status: Home / Self Care | Payer: Medicare Other | Attending: Hospitalist | Admitting: Hospitalist

## 2020-10-01 DIAGNOSIS — Z8249 Family history of ischemic heart disease and other diseases of the circulatory system: Secondary | ICD-10-CM

## 2020-10-01 DIAGNOSIS — I252 Old myocardial infarction: Secondary | ICD-10-CM

## 2020-10-01 DIAGNOSIS — K565 Intestinal adhesions [bands], unspecified as to partial versus complete obstruction: Secondary | ICD-10-CM | POA: Diagnosis present

## 2020-10-01 DIAGNOSIS — Z90710 Acquired absence of both cervix and uterus: Secondary | ICD-10-CM | POA: Diagnosis not present

## 2020-10-01 DIAGNOSIS — I2510 Atherosclerotic heart disease of native coronary artery without angina pectoris: Secondary | ICD-10-CM | POA: Diagnosis present

## 2020-10-01 DIAGNOSIS — Z87442 Personal history of urinary calculi: Secondary | ICD-10-CM

## 2020-10-01 DIAGNOSIS — R110 Nausea: Secondary | ICD-10-CM | POA: Diagnosis not present

## 2020-10-01 DIAGNOSIS — E785 Hyperlipidemia, unspecified: Secondary | ICD-10-CM | POA: Diagnosis present

## 2020-10-01 DIAGNOSIS — Z79899 Other long term (current) drug therapy: Secondary | ICD-10-CM

## 2020-10-01 DIAGNOSIS — E860 Dehydration: Secondary | ICD-10-CM | POA: Diagnosis present

## 2020-10-01 DIAGNOSIS — Z9289 Personal history of other medical treatment: Secondary | ICD-10-CM | POA: Diagnosis not present

## 2020-10-01 DIAGNOSIS — N179 Acute kidney failure, unspecified: Secondary | ICD-10-CM | POA: Diagnosis present

## 2020-10-01 DIAGNOSIS — G4730 Sleep apnea, unspecified: Secondary | ICD-10-CM | POA: Diagnosis present

## 2020-10-01 DIAGNOSIS — K449 Diaphragmatic hernia without obstruction or gangrene: Secondary | ICD-10-CM | POA: Diagnosis not present

## 2020-10-01 DIAGNOSIS — Z20822 Contact with and (suspected) exposure to covid-19: Secondary | ICD-10-CM | POA: Diagnosis present

## 2020-10-01 DIAGNOSIS — N1832 Chronic kidney disease, stage 3b: Secondary | ICD-10-CM | POA: Diagnosis present

## 2020-10-01 DIAGNOSIS — Z955 Presence of coronary angioplasty implant and graft: Secondary | ICD-10-CM

## 2020-10-01 DIAGNOSIS — Z951 Presence of aortocoronary bypass graft: Secondary | ICD-10-CM | POA: Diagnosis not present

## 2020-10-01 DIAGNOSIS — Z85528 Personal history of other malignant neoplasm of kidney: Secondary | ICD-10-CM | POA: Diagnosis not present

## 2020-10-01 DIAGNOSIS — Z9049 Acquired absence of other specified parts of digestive tract: Secondary | ICD-10-CM | POA: Diagnosis not present

## 2020-10-01 DIAGNOSIS — M1990 Unspecified osteoarthritis, unspecified site: Secondary | ICD-10-CM | POA: Diagnosis present

## 2020-10-01 DIAGNOSIS — N200 Calculus of kidney: Secondary | ICD-10-CM | POA: Diagnosis not present

## 2020-10-01 DIAGNOSIS — Z7982 Long term (current) use of aspirin: Secondary | ICD-10-CM

## 2020-10-01 DIAGNOSIS — I10 Essential (primary) hypertension: Secondary | ICD-10-CM | POA: Diagnosis not present

## 2020-10-01 DIAGNOSIS — K219 Gastro-esophageal reflux disease without esophagitis: Secondary | ICD-10-CM | POA: Diagnosis present

## 2020-10-01 DIAGNOSIS — Z905 Acquired absence of kidney: Secondary | ICD-10-CM

## 2020-10-01 DIAGNOSIS — R112 Nausea with vomiting, unspecified: Secondary | ICD-10-CM | POA: Diagnosis not present

## 2020-10-01 DIAGNOSIS — K56699 Other intestinal obstruction unspecified as to partial versus complete obstruction: Secondary | ICD-10-CM | POA: Diagnosis not present

## 2020-10-01 DIAGNOSIS — K56609 Unspecified intestinal obstruction, unspecified as to partial versus complete obstruction: Secondary | ICD-10-CM | POA: Diagnosis not present

## 2020-10-01 DIAGNOSIS — N189 Chronic kidney disease, unspecified: Secondary | ICD-10-CM

## 2020-10-01 DIAGNOSIS — D509 Iron deficiency anemia, unspecified: Secondary | ICD-10-CM

## 2020-10-01 DIAGNOSIS — Z4682 Encounter for fitting and adjustment of non-vascular catheter: Secondary | ICD-10-CM | POA: Diagnosis not present

## 2020-10-01 DIAGNOSIS — R0902 Hypoxemia: Secondary | ICD-10-CM | POA: Diagnosis not present

## 2020-10-01 DIAGNOSIS — I129 Hypertensive chronic kidney disease with stage 1 through stage 4 chronic kidney disease, or unspecified chronic kidney disease: Secondary | ICD-10-CM | POA: Diagnosis present

## 2020-10-01 DIAGNOSIS — Z888 Allergy status to other drugs, medicaments and biological substances status: Secondary | ICD-10-CM

## 2020-10-01 DIAGNOSIS — I4891 Unspecified atrial fibrillation: Secondary | ICD-10-CM | POA: Diagnosis present

## 2020-10-01 DIAGNOSIS — K6389 Other specified diseases of intestine: Secondary | ICD-10-CM | POA: Diagnosis not present

## 2020-10-01 DIAGNOSIS — K5750 Diverticulosis of both small and large intestine without perforation or abscess without bleeding: Secondary | ICD-10-CM | POA: Diagnosis not present

## 2020-10-01 LAB — CBC WITH DIFFERENTIAL/PLATELET
Abs Immature Granulocytes: 0.05 10*3/uL (ref 0.00–0.07)
Basophils Absolute: 0 10*3/uL (ref 0.0–0.1)
Basophils Relative: 0 %
Eosinophils Absolute: 0.1 10*3/uL (ref 0.0–0.5)
Eosinophils Relative: 0 %
HCT: 37.3 % (ref 36.0–46.0)
Hemoglobin: 11.8 g/dL — ABNORMAL LOW (ref 12.0–15.0)
Immature Granulocytes: 0 %
Lymphocytes Relative: 9 %
Lymphs Abs: 1.2 10*3/uL (ref 0.7–4.0)
MCH: 28.2 pg (ref 26.0–34.0)
MCHC: 31.6 g/dL (ref 30.0–36.0)
MCV: 89 fL (ref 80.0–100.0)
Monocytes Absolute: 0.9 10*3/uL (ref 0.1–1.0)
Monocytes Relative: 6 %
Neutro Abs: 11.2 10*3/uL — ABNORMAL HIGH (ref 1.7–7.7)
Neutrophils Relative %: 85 %
Platelets: 265 10*3/uL (ref 150–400)
RBC: 4.19 MIL/uL (ref 3.87–5.11)
RDW: 13.2 % (ref 11.5–15.5)
WBC: 13.4 10*3/uL — ABNORMAL HIGH (ref 4.0–10.5)
nRBC: 0 % (ref 0.0–0.2)

## 2020-10-01 LAB — BASIC METABOLIC PANEL
Anion gap: 9 (ref 5–15)
BUN: 34 mg/dL — ABNORMAL HIGH (ref 8–23)
CO2: 21 mmol/L — ABNORMAL LOW (ref 22–32)
Calcium: 8.7 mg/dL — ABNORMAL LOW (ref 8.9–10.3)
Chloride: 106 mmol/L (ref 98–111)
Creatinine, Ser: 1.48 mg/dL — ABNORMAL HIGH (ref 0.44–1.00)
GFR, Estimated: 37 mL/min — ABNORMAL LOW (ref >60)
Glucose, Bld: 151 mg/dL — ABNORMAL HIGH (ref 70–99)
Potassium: 4.6 mmol/L (ref 3.5–5.1)
Sodium: 136 mmol/L (ref 135–145)

## 2020-10-01 LAB — COMPREHENSIVE METABOLIC PANEL
ALT: 14 U/L (ref 0–44)
AST: 20 U/L (ref 15–41)
Albumin: 4.2 g/dL (ref 3.5–5.0)
Alkaline Phosphatase: 55 U/L (ref 38–126)
Anion gap: 11 (ref 5–15)
BUN: 32 mg/dL — ABNORMAL HIGH (ref 8–23)
CO2: 22 mmol/L (ref 22–32)
Calcium: 9.2 mg/dL (ref 8.9–10.3)
Chloride: 103 mmol/L (ref 98–111)
Creatinine, Ser: 1.76 mg/dL — ABNORMAL HIGH (ref 0.44–1.00)
GFR, Estimated: 30 mL/min — ABNORMAL LOW (ref >60)
Glucose, Bld: 160 mg/dL — ABNORMAL HIGH (ref 70–99)
Potassium: 4.4 mmol/L (ref 3.5–5.1)
Sodium: 136 mmol/L (ref 135–145)
Total Bilirubin: 0.9 mg/dL (ref 0.3–1.2)
Total Protein: 7.4 g/dL (ref 6.5–8.1)

## 2020-10-01 LAB — LACTIC ACID, PLASMA: Lactic Acid, Venous: 1.8 mmol/L (ref 0.5–1.9)

## 2020-10-01 LAB — CBC
HCT: 35.9 % — ABNORMAL LOW (ref 36.0–46.0)
Hemoglobin: 11.5 g/dL — ABNORMAL LOW (ref 12.0–15.0)
MCH: 28.4 pg (ref 26.0–34.0)
MCHC: 32 g/dL (ref 30.0–36.0)
MCV: 88.6 fL (ref 80.0–100.0)
Platelets: 221 10*3/uL (ref 150–400)
RBC: 4.05 MIL/uL (ref 3.87–5.11)
RDW: 13.1 % (ref 11.5–15.5)
WBC: 15.3 10*3/uL — ABNORMAL HIGH (ref 4.0–10.5)
nRBC: 0 % (ref 0.0–0.2)

## 2020-10-01 LAB — RESP PANEL BY RT-PCR (FLU A&B, COVID) ARPGX2
Influenza A by PCR: NEGATIVE
Influenza B by PCR: NEGATIVE
SARS Coronavirus 2 by RT PCR: NEGATIVE

## 2020-10-01 LAB — LIPASE, BLOOD: Lipase: 34 U/L (ref 11–51)

## 2020-10-01 LAB — MAGNESIUM: Magnesium: 2.2 mg/dL (ref 1.7–2.4)

## 2020-10-01 MED ORDER — MORPHINE SULFATE (PF) 4 MG/ML IV SOLN: 4.0000 mg | INTRAVENOUS | Status: DC | PRN

## 2020-10-01 MED ORDER — METOPROLOL TARTRATE 5 MG/5ML IV SOLN
2.5000 mg | Freq: Four times a day (QID) | INTRAVENOUS | Status: DC
  Administered 2020-10-01 – 2020-10-03 (×10): 2.5 mg via INTRAVENOUS
  Filled 2020-10-01 (×9): qty 5

## 2020-10-01 MED ORDER — NITROGLYCERIN 0.4 MG SL SUBL: 0.4000 mg | SUBLINGUAL_TABLET | SUBLINGUAL | Status: DC | PRN

## 2020-10-01 MED ORDER — MORPHINE SULFATE (PF) 4 MG/ML IV SOLN
4.0000 mg | Freq: Once | INTRAVENOUS | Status: AC
  Administered 2020-10-01: 4 mg via INTRAVENOUS
  Filled 2020-10-01: qty 1

## 2020-10-01 MED ORDER — ACETAMINOPHEN 325 MG PO TABS: 650.0000 mg | ORAL_TABLET | Freq: Four times a day (QID) | ORAL | Status: DC | PRN

## 2020-10-01 MED ORDER — MORPHINE SULFATE (PF) 4 MG/ML IV SOLN
4.0000 mg | INTRAVENOUS | Status: DC | PRN
  Administered 2020-10-01: 4 mg via INTRAVENOUS
  Filled 2020-10-01: qty 1

## 2020-10-01 MED ORDER — ISOSORBIDE MONONITRATE ER 30 MG PO TB24
30.0000 mg | ORAL_TABLET | Freq: Every day | ORAL | Status: DC
  Administered 2020-10-03 – 2020-10-04 (×2): 30 mg via ORAL
  Filled 2020-10-01 (×2): qty 1

## 2020-10-01 MED ORDER — METOPROLOL SUCCINATE ER 50 MG PO TB24: 50.0000 mg | ORAL_TABLET | Freq: Every day | ORAL | Status: DC

## 2020-10-01 MED ORDER — SODIUM CHLORIDE 0.9 % IV SOLN: Freq: Once | INTRAVENOUS | Status: DC

## 2020-10-01 MED ORDER — ENOXAPARIN SODIUM 30 MG/0.3ML IJ SOSY
30.0000 mg | PREFILLED_SYRINGE | INTRAMUSCULAR | Status: DC
  Filled 2020-10-01: qty 0.3

## 2020-10-01 MED ORDER — ONDANSETRON HCL 4 MG/2ML IJ SOLN
4.0000 mg | Freq: Once | INTRAMUSCULAR | Status: AC
  Administered 2020-10-01: 4 mg via INTRAVENOUS
  Filled 2020-10-01: qty 2

## 2020-10-01 MED ORDER — BENZOCAINE 20 % MT AERO
1.0000 "application " | INHALATION_SPRAY | Freq: Once | OROMUCOSAL | Status: DC
  Filled 2020-10-01 (×2): qty 57

## 2020-10-01 MED ORDER — ONDANSETRON HCL 4 MG PO TABS: 4.0000 mg | ORAL_TABLET | Freq: Four times a day (QID) | ORAL | Status: DC | PRN

## 2020-10-01 MED ORDER — TRAZODONE HCL 50 MG PO TABS: 25.0000 mg | ORAL_TABLET | Freq: Every evening | ORAL | Status: DC | PRN

## 2020-10-01 MED ORDER — SODIUM CHLORIDE 0.9 % IV SOLN: INTRAVENOUS | Status: DC

## 2020-10-01 MED ORDER — ROSUVASTATIN CALCIUM 20 MG PO TABS
40.0000 mg | ORAL_TABLET | Freq: Every day | ORAL | Status: DC
  Administered 2020-10-04: 40 mg via ORAL
  Filled 2020-10-01: qty 4
  Filled 2020-10-01 (×4): qty 2

## 2020-10-01 MED ORDER — SODIUM CHLORIDE 0.9 % IV BOLUS
500.0000 mL | Freq: Once | INTRAVENOUS | Status: AC
  Administered 2020-10-01: 500 mL via INTRAVENOUS

## 2020-10-01 MED ORDER — ENOXAPARIN SODIUM 40 MG/0.4ML IJ SOSY
40.0000 mg | PREFILLED_SYRINGE | INTRAMUSCULAR | Status: DC
  Administered 2020-10-01 – 2020-10-03 (×3): 40 mg via SUBCUTANEOUS
  Filled 2020-10-01 (×3): qty 0.4

## 2020-10-01 MED ORDER — ACETAMINOPHEN 650 MG RE SUPP: 650.0000 mg | Freq: Four times a day (QID) | RECTAL | Status: DC | PRN

## 2020-10-01 MED ORDER — MAGNESIUM HYDROXIDE 400 MG/5ML PO SUSP: 30.0000 mL | Freq: Every day | ORAL | Status: DC | PRN

## 2020-10-01 MED ORDER — AMLODIPINE BESYLATE 5 MG PO TABS
5.0000 mg | ORAL_TABLET | Freq: Every day | ORAL | Status: DC
  Administered 2020-10-03 – 2020-10-04 (×2): 5 mg via ORAL
  Filled 2020-10-01 (×2): qty 1

## 2020-10-01 MED ORDER — ONDANSETRON HCL 4 MG/2ML IJ SOLN: 4.0000 mg | Freq: Four times a day (QID) | INTRAMUSCULAR | Status: DC | PRN

## 2020-10-01 MED ORDER — EZETIMIBE 10 MG PO TABS
10.0000 mg | ORAL_TABLET | Freq: Every day | ORAL | Status: DC
  Administered 2020-10-04: 10 mg via ORAL
  Filled 2020-10-01 (×4): qty 1

## 2020-10-01 MED ORDER — ONDANSETRON HCL 4 MG PO TABS: 4.0000 mg | ORAL_TABLET | Freq: Three times a day (TID) | ORAL | Status: DC | PRN

## 2020-10-01 NOTE — Progress Notes (Signed)
Anticoagulation monitoring(Lovenox):  75 yo  female ordered Lovenox 40 mg Q24h  Filed Weights   10/01/20 0120  Weight: 72.6 kg (160 lb)   BMI 29   Lab Results  Component Value Date   CREATININE 1.76 (H) 10/01/2020   CREATININE 1.55 (H) 09/23/2020   CREATININE 1.67 (H) 09/18/2020   Estimated Creatinine Clearance: 27 mL/min (A) (by C-G formula based on SCr of 1.76 mg/dL (H)). Hemoglobin & Hematocrit     Component Value Date/Time   HGB 11.8 (L) 10/01/2020 0119   HGB 9.5 (L) 06/06/2014 0448   HCT 37.3 10/01/2020 0119   HCT 32.2 (L) 06/06/2014 0448     Per Protocol for Patient with estCrcl < 30 ml/min and BMI < 40, will transition to Lovenox 30 mg Q24h.

## 2020-10-01 NOTE — Consult Note (Addendum)
[facility] SURGICAL CONSULTATION NOTE (initial) - [id number]   HISTORY OF PRESENT ILLNESS (HPI):  75 y.o. female presented to [facility] ED overnight for evaluation of abdominal pain. Patient reports 24 hour history of left sided abdominal pain which awoke her from sleep last night. This was a sharp pain. Nothing seemed to make this better. She does endorse associated nausea and emesis with the pain. No fever, chills, cough, congestion, SOB, CP, or urinary changes. She is not passing flatus. She does have a history of multiple intra-abdominal surgeries including abdominal hysterectomy, appendectomy, cholecystectomy, right nephrectomy, and what appears to be a bowel resection with anastomosis on imaging, but she denied ever having this. Work up in the ED revealed mild leukocytosis to 13.4K (now 15.3), possible AKI with sCr - 1.76 (now 1.48), and CT Abdomen/Pelvis was concerning for SBO vs pSBO. She was admitted to the medicine service and NGT was placed.   This morning, she reports that her abdominal pain has resolved and is no sore. Any abdominal distension she had yesterday is resolved. No longer with nausea or emesis. Most of her complaints are regarding the NGT. She denies any flatus since admission.   Surgery is consulted by emergency medicine physician Dr. Merlyn Lot, MD in this context for evaluation and management of SBO.  PAST MEDICAL HISTORY (PMH):  Past Medical History:  Diagnosis Date   A-fib ([place])    Anemia    Anginal pain ([place])    Anxiety    Arthritis    CAD (coronary artery disease)    Cancer (HCC)    Carpal tunnel syndrome    bilateral   Cataract    bil removed cataracts   Coronary artery disease    2x stents, Dr. Clayborn Bigness   Depression    Dysrhythmia    Family history of adverse reaction to anesthesia    "brother; S/P lipotripsy in [place]; transferred to [facility]; had to put him on life support for 2 days"   GERD (gastroesophageal reflux  disease)    Headache    "weekly" (09/13/2014)   History of blood transfusion 05/2014   "we haven't figured out why I needed it yet" (09/13/2014)   History of hiatal hernia    Hyperglycemia    Hyperlipidemia    Hypertension    Kidney stone    Myocardial infarct ([place])    Myocardial infarct, old 1999   15 years ago   Obesity    PONV (postoperative nausea and vomiting)    Renal mass    S/P CABG x 4 09/18/2014   LIMA to LAD, SVG to PDA-dRCA sequentially, SVG to OM, EVH via right thigh    Sleep apnea    Sleep apnea    "don't use a mask" (09/13/2014)   Unstable angina (HCC)      PAST SURGICAL HISTORY ([place]):  Past Surgical History:  Procedure Laterality Date   ABDOMINAL HYSTERECTOMY     APPENDECTOMY     BACK SURGERY     BLADDER SURGERY     CARDIAC CATHETERIZATION  06/2014   CARDIAC CATHETERIZATION N/A 06/16/2016   Procedure: Left Heart Cath and Cors/Grafts Angiography;  Surgeon: Burnell Blanks, MD;  Location: [facility];  Service: Cardiovascular;  Laterality: N/A;   CARPAL TUNNEL RELEASE Left    CATARACT EXTRACTION W/ INTRAOCULAR LENS  IMPLANT, BILATERAL     CHOLECYSTECTOMY     COLONOSCOPY     CORONARY ANGIOPLASTY WITH STENT PLACEMENT  1999   [facility]  x2 stent   CORONARY ARTERY BYPASS GRAFT N/A 09/18/2014   Procedure: CORONARY ARTERY BYPASS GRAFTING (CABG)times four using LIMA  to LAD:SVG to  PD and DIST RCA;SVG to OM;, EVH Right thigh;  Surgeon: [name], MD;  Location: [facility];  Service: Open Heart Surgery;  Laterality: N/A;   CYSTOSCOPY W/ RETROGRADES Left 03/15/2020   Procedure: CYSTOSCOPY WITH RETROGRADE PYELOGRAM;  Surgeon: Billey Co, MD;  Location: [facility];  Service: Urology;  Laterality: Left;   CYSTOSCOPY WITH STENT PLACEMENT Left 02/20/2020   Procedure: CYSTOSCOPY WITH STENT PLACEMENT;  Surgeon: Billey Co, MD;  Location: [facility];  Service: Urology;  Laterality: Left;   CYSTOSCOPY/URETEROSCOPY/HOLMIUM LASER/STENT PLACEMENT Left 03/15/2020    Procedure: CYSTOSCOPY/URETEROSCOPY;  Surgeon: Billey Co, MD;  Location: [facility];  Service: Urology;  Laterality: Left;   ESOPHAGOGASTRODUODENOSCOPY N/A 09/17/2014   Procedure: ESOPHAGOGASTRODUODENOSCOPY (EGD);  Surgeon: Inda Castle, MD;  Location: [facility];  Service: Endoscopy;  Laterality: N/A;   heart stents     [place] Right X 3   "last one was in the 1990's; still have hernia there now" (09/13/2014)   [place]  1990's?   LUMBAR SPINE SURGERY     removal of ovaries     TEE WITHOUT CARDIOVERSION N/A 09/18/2014   Procedure: TRANSESOPHAGEAL ECHOCARDIOGRAM (TEE);  Surgeon: [name], MD;  Location: [facility];  Service: Open Heart Surgery;  Laterality: N/A;   UPPER GASTROINTESTINAL ENDOSCOPY       MEDICATIONS:  Prior to Admission medications   Medication Sig Start Date End Date Taking? Authorizing Provider  acetaminophen (TYLENOL) 500 MG tablet Take 500 mg by mouth every 6 (six) hours as needed for headache.   Yes [provider]  amLODipine (NORVASC) 5 MG tablet TAKE 1 TABLET BY MOUTH ONCE DAILY 06/11/20  Yes [name], MD  aspirin EC 81 MG tablet Take 1 tablet (81 mg total) by mouth daily. 07/09/16  Yes Deboraha Sprang, MD  ezetimibe (ZETIA) 10 MG tablet Take 1 tablet (10 mg total) by mouth daily. 09/24/20  Yes [name], MD  isosorbide mononitrate (IMDUR) 30 MG 24 hr tablet TAKE 1 TABLET BY MOUTH ONCE DAILY 09/16/20  Yes [name], MD  lisinopril (ZESTRIL) 10 MG tablet Take 1 tablet (10 mg total) by mouth daily. 07/30/20  Yes [name], MD  metoprolol succinate (TOPROL-XL) 50 MG 24 hr tablet TAKE 1 TABLET BY MOUTH ONCE DAILY 07/30/20  Yes [name], MD  ondansetron (ZOFRAN) 4 MG tablet Take 1 tablet (4 mg total) by mouth every 8 (eight) hours as needed for up to 10 doses for nausea or vomiting. 02/19/20  Yes  Lucrezia Starch, MD  rosuvastatin (CRESTOR) 40 MG tablet TAKE 1 TABLET BY MOUTH ONCE DAILY 09/16/20  Yes [name], MD  nitroGLYCERIN (NITROSTAT) 0.4 MG SL tablet Place 1 tablet (0.4 mg total) under the tongue every 5 (five) minutes as needed for chest pain. Take for up to 3 doses and then call 911 if still having chest pain. 10/11/19   [name], MD     ALLERGIES:  Allergies  Allergen Reactions   Ferrous Sulfate [Ferrous Fumarate] Other (See Comments)    STOMACH ISSUES   Lipitor [Atorvastatin] Other (See Comments)    myalgia     SOCIAL HISTORY:  Social History   Socioeconomic History   Marital status: Married    Spouse name: Not on file   Number of children: Not on file   Years of education: Not on file   Highest education level: Not on file  Occupational History   Not on file  Tobacco Use   Smoking status: Never Smoker   Smokeless tobacco: Never Used  Vaping Use   Vaping Use: Never used  Substance and Sexual Activity   Alcohol use: Not Currently    Alcohol/week: 0.0 standard drinks   Drug use: No   Sexual activity: Not Currently    Birth control/protection: Post-menopausal  Other Topics Concern   Not on file  Social History Narrative   ** Merged History Encounter **       Lives in [place] with 29 YO nephew and husband. 3 dogs outside.  Work - Doctor, general practice, [employer]  Diet - regular, limited meat  Exercise - walks some   Social Determinants of Radio broadcast assistant Strain: Not on file  Food Insecurity: Not on file  Transportation Needs: Not on file  Physical Activity: Not on file  Stress: Not on file  Social Connections: Not on file  Intimate Partner Violence: Not on file     FAMILY HISTORY:  Family History  Problem Relation Age of Onset   Coronary artery disease Mother    Osteoporosis Mother    Heart disease Mother    Stroke Mother    Heart attack Mother    Hypertension Mother    Hypertension Father    Coronary  artery disease Father    COPD Father    Heart disease Father    Heart attack Father    Glaucoma Father    Osteoporosis Father    Emphysema Father    Cancer Sister 61       colon   Hypertension Sister    Colon cancer Sister        dx in her 46's   Breast cancer Sister    Colon polyps Sister    Stroke Brother    Hypertension Brother    Colon polyps Brother    Stroke Maternal Grandfather    Stroke Maternal Grandmother    Osteoporosis Maternal Grandmother    Hypertension Maternal Grandmother    Hypertension Paternal Grandmother    Hypertension Paternal Grandfather    Breast cancer Maternal Aunt        2 aunts-50's   Esophageal cancer Neg Hx    Stomach cancer Neg Hx    Rectal cancer Neg Hx    Pancreatic cancer Neg Hx       REVIEW OF SYSTEMS:  Review of Systems  Constitutional: Negative for chills and fever.  HENT: Negative for congestion and sore throat.   Respiratory: Negative for cough and shortness of breath.   Cardiovascular: Negative for chest pain and palpitations.  Gastrointestinal: Positive for abdominal pain, nausea and vomiting. Negative for constipation and diarrhea.  Genitourinary: Negative for dysuria and urgency.  All other systems reviewed and are negative.   VITAL SIGNS:  Temp:  [98 F (36.7 C)-98.2 F (36.8 C)] 98 F (36.7 C) (05/10 0546) Pulse Rate:  [77-86] 85 (05/10 0546) Resp:  [13-19] 18 (05/10 0546) BP: (140-182)/(76-96) 163/77 (05/10 0546) SpO2:  [94 %-99 %] 97 % (05/10 0546) Weight:  [72.6 kg-73.7 kg] 73.7 kg (05/10 0546)     Height: 5\' 2"  (157.5 cm) Weight: 73.7 kg BMI (Calculated): 29.71   INTAKE/OUTPUT:  05/09 0701 - 05/10 0700 In: 182.5 [P.O.:120; I.V.:62.5] Out: -   PHYSICAL EXAM:  Physical Exam Vitals and nursing note reviewed.  Constitutional:      General: She is not in acute distress.    Appearance: Normal appearance. She is normal weight. She is not ill-appearing.  HENT:     Head: Normocephalic and atraumatic.  Eyes:      General: No scleral icterus.    Conjunctiva/sclera: Conjunctivae normal.  Cardiovascular:     Rate and Rhythm: Normal rate and regular rhythm.     Pulses: Normal pulses.     Heart sounds: No murmur heard.   Pulmonary:     Effort: Pulmonary effort is normal. No respiratory distress.  Abdominal:     General: A surgical scar is present. There is no distension.     Palpations: Abdomen is soft.     Tenderness: There is abdominal tenderness. There is no guarding or rebound.     Comments: Abdomen is soft, generalized soreness, non-distended, no rebound/guarding, previous surgical scars seen  Genitourinary:    Comments: Deferred Musculoskeletal:     Right lower leg: No edema.     Left lower leg: No edema.  Skin:    General: Skin is warm and dry.     Coloration: Skin is not pale.     Findings: No erythema.  Neurological:     General: No focal deficit present.     Mental Status: She is alert and oriented to person, place, and time.  Psychiatric:        Mood and Affect: Mood normal.        Behavior: Behavior normal.      Labs:  CBC Latest Ref Rng & Units 10/01/2020 10/01/2020 09/18/2020  WBC 4.0 - 10.5 K/uL 15.3(H) 13.4(H) 4.6  Hemoglobin 12.0 - 15.0 g/dL 11.5(L) 11.8(L) 12.1  Hematocrit 36.0 - 46.0 % 35.9(L) 37.3 36.9  Platelets 150 - 400 K/uL 221 265 204.0   CMP Latest Ref Rng & Units 10/01/2020 10/01/2020 09/23/2020  Glucose 70 - 99 mg/dL 151(H) 160(H) 107(H)  BUN 8 - 23 mg/dL 34(H) 32(H) 30(H)  Creatinine 0.44 - 1.00 mg/dL 1.48(H) 1.76(H) 1.55(H)  Sodium 135 - 145 mmol/L 136 136 137  Potassium 3.5 - 5.1 mmol/L 4.6 4.4 5.0  Chloride 98 - 111 mmol/L 106 103 103  CO2 22 - 32 mmol/L 21(L) 22 21  Calcium 8.9 - 10.3 mg/dL 8.7(L) 9.2 9.4  Total Protein 6.5 - 8.1 g/dL - 7.4 -  Total Bilirubin 0.3 - 1.2 mg/dL - 0.9 -  Alkaline Phos 38 - 126 U/L - 55 -  AST 15 - 41 U/L - 20 -  ALT 0 - 44 U/L - 14 -    Imaging studies:   CT Abdomen/Pelvis (10/01/2020) personally reviewed with small  bowel dilation and possible transition at previous anastomosis, and radiologist report reviewed below:  IMPRESSION: 1. Small-bowel obstruction with transition zone in the lower abdomen at the ileo ileal anastomosis. 2. Severe colonic diverticulosis. 3. Status post prior right nephrectomy. 4. Aortic Atherosclerosis (ICD10-I70.0).    Assessment/Plan: (ICD-10's: K7.609) 75 y.o. female with what appears to be SBO vs pSBO likely secondary to post-surgical adhesions given her extensive surgical history   - Appreciate medicine admission.    - Agree with plan for NGT decompression; LIS: monitor and reocrd output  - Monitor abdominal examination; on-going bowel function  - No emergent surgical intervention. She understands that if she should fail to resolve with conservative measures she  would likely need exploration  - Continue NPO + IVF resuscitation  - Pain control prn; antiemetics prn  - Okay to mobilize   - Further management per primary service; we will follow   All of the above findings and recommendations were discussed with the patient, and all of patient's questions were answered to her expressed satisfaction.  Thank you for the opportunity to participate in this patient's care.   -- Edison Simon, PA-C [facility] 10/01/2020, 7:22 AM [phone] M-F: 7am - 4pm  I saw and evaluated the patient.  I agree with the above documentation, exam, and plan, which I have edited where appropriate. Fredirick Maudlin  5:29 PM

## 2020-10-01 NOTE — Progress Notes (Signed)
PROGRESS NOTE    Debbie Delacruz  [id number] DOB: 08/10/1945 DOA: 10/01/2020 PCP: [name], MD   Brief Narrative: Taken from H&P. Debbie Delacruz is a 75 y.o. female with medical history significant for coronary artery disease, atrial fibrillation, depression, GERD, hypertension, dyslipidemia and sleep apnea, who presented to the emergency room with acute onset of lower abdominal abdominal and left flank pain as well as recurrent nausea and vomiting.  CT abdomen concerning for small bowel obstruction with transition zone in the lower abdomenAt the ileal ileal anastomosis.  Patient has an history of multiple abdominal surgeries including right nephrectomy.  NG tube was placed with improvement in her symptoms. General surgery was consulted and they will try conservative management at this time and if failed she will go to the OR for exploratory laparotomy.  Subjective: Patient was feeling little improved when seen during morning rounds, abdominal pain, nausea and vomiting improved with NG tube placement.  No flatus or bowel movement yet.  Assessment & Plan:   Active Problems:   SBO (small bowel obstruction) (HCC)  Small bowel obstruction.  Most likely secondary to adhesions with history of multiple abdominal surgeries.  Surgery would like to try conservative management before and if failed then she will go to the OR for exploratory laparotomy. -Keep patient n.p.o. -Continue with NG tube and suctioning. -Monitor for any spontaneous resolution of symptoms, passing flatus and bowel movement.  AKI with CKD stage IIIa.  Baseline creatinine around 1 according to the labs done in October and November 2021.  Most likely secondary to dehydration with GI losses, creatinine improving with IV fluid. -Monitor renal function -Avoid nephrotoxins  Essential hypertension.  Blood pressure mildly elevated. -Continue with Norvasc, Imdur and Toprol. -Keep holding Zestril due to  AKI.  Coronary artery disease.  No chest pain. -Continue with beta-blocker-has been converted to IV. -We will restart statin and Imdur once to start taking p.o. -Continue with sublingual nitroglycerin as needed.  Dyslipidemia. -Home dose of statin currently on hold as patient is n.p.o.  Objective: Vitals:   10/01/20 0430 10/01/20 0546 10/01/20 0904 10/01/20 1105  BP: 140/76 (!) 163/77 135/82 (!) 141/71  Pulse: 83 85 89 85  Resp:  18 18 16   Temp:  98 F (36.7 C) 98.2 F (36.8 C) 98.5 F (36.9 C)  TempSrc:  Oral Oral Oral  SpO2: 98% 97% 96% 94%  Weight:  73.7 kg    Height:  5\' 2"  (1.575 m)      Intake/Output Summary (Last 24 hours) at 10/01/2020 1344 Last data filed at 10/01/2020 0617 Gross per 24 hour  Intake 182.51 ml  Output --  Net 182.51 ml   Filed Weights   10/01/20 0120 10/01/20 0546  Weight: 72.6 kg 73.7 kg    Examination:  General exam: Appears calm and comfortable, NG tube in place. Respiratory system: Clear to auscultation. Respiratory effort normal. Cardiovascular system: S1 & S2 heard, RRR.  Gastrointestinal system: Soft, nontender, nondistended, bowel sounds hypoactive Central nervous system: Alert and oriented. No focal neurological deficits.Symmetric 5 x 5 power. Extremities: No edema, no cyanosis, pulses intact and symmetrical. Psychiatry: Judgement and insight appear normal. Mood & affect appropriate.    DVT prophylaxis: Lovenox Code Status: Full Family Communication: Husband was updated at bedside. Disposition Plan:  Status is: Inpatient  Remains inpatient appropriate because:Inpatient level of care appropriate due to severity of illness   Dispo: The patient is from: Home  Anticipated d/c is to: Home              Patient currently is not medically stable to d/c.   Difficult to place patient No                Level of care: Med-Surg  All the records are reviewed and case discussed with Care Management/Social Worker. Management  plans discussed with the patient, nursing and they are in agreement.  Consultants:   General surgery  Procedures:  Antimicrobials:   Data Reviewed: I have personally reviewed following labs and imaging studies  CBC: Recent Labs  Lab 10/01/20 0119 10/01/20 0515  WBC 13.4* 15.3*  NEUTROABS 11.2*  --   HGB 11.8* 11.5*  HCT 37.3 35.9*  MCV 89.0 88.6  PLT 265 128   Basic Metabolic Panel: Recent Labs  Lab 10/01/20 0119 10/01/20 0515  NA 136 136  K 4.4 4.6  CL 103 106  CO2 22 21*  GLUCOSE 160* 151*  BUN 32* 34*  CREATININE 1.76* 1.48*  CALCIUM 9.2 8.7*  MG  --  2.2   GFR: Estimated Creatinine Clearance: 32.3 mL/min (A) (by C-G formula based on SCr of 1.48 mg/dL (H)). Liver Function Tests: Recent Labs  Lab 10/01/20 0119  AST 20  ALT 14  ALKPHOS 55  BILITOT 0.9  PROT 7.4  ALBUMIN 4.2   Recent Labs  Lab 10/01/20 0119  LIPASE 34   No results for input(s): AMMONIA in the last 168 hours. Coagulation Profile: No results for input(s): INR, PROTIME in the last 168 hours. Cardiac Enzymes: No results for input(s): CKTOTAL, CKMB, CKMBINDEX, TROPONINI in the last 168 hours. BNP (last 3 results) No results for input(s): PROBNP in the last 8760 hours. HbA1C: No results for input(s): HGBA1C in the last 72 hours. CBG: No results for input(s): GLUCAP in the last 168 hours. Lipid Profile: No results for input(s): CHOL, HDL, LDLCALC, TRIG, CHOLHDL, LDLDIRECT in the last 72 hours. Thyroid Function Tests: No results for input(s): TSH, T4TOTAL, FREET4, T3FREE, THYROIDAB in the last 72 hours. Anemia Panel: No results for input(s): VITAMINB12, FOLATE, FERRITIN, TIBC, IRON, RETICCTPCT in the last 72 hours. Sepsis Labs: Recent Labs  Lab 10/01/20 0146  LATICACIDVEN 1.8    Recent Results (from the past 240 hour(s))  Resp Panel by RT-PCR (Flu A&B, Covid) Nasopharyngeal Swab     Status: None   Collection Time: 10/01/20  4:22 AM   Specimen: Nasopharyngeal Swab;  Nasopharyngeal(NP) swabs in vial transport medium  Result Value Ref Range Status   SARS Coronavirus 2 by RT PCR NEGATIVE NEGATIVE Final    Comment: (NOTE) SARS-CoV-2 target nucleic acids are NOT DETECTED.  The SARS-CoV-2 RNA is generally detectable in upper respiratory specimens during the acute phase of infection. The lowest concentration of SARS-CoV-2 viral copies this assay can detect is 138 copies/mL. A negative result does not preclude SARS-Cov-2 infection and should not be used as the sole basis for treatment or other patient management decisions. A negative result may occur with  improper specimen collection/handling, submission of specimen other than nasopharyngeal swab, presence of viral mutation(s) within the areas targeted by this assay, and inadequate number of viral copies(<138 copies/mL). A negative result must be combined with clinical observations, patient history, and epidemiological information. The expected result is Negative.  Fact Sheet for Patients:  EntrepreneurPulse.com.au  Fact Sheet for Healthcare Providers:  IncredibleEmployment.be  This test is no t yet approved or cleared by the [place] FDA and  has  been authorized for detection and/or diagnosis of SARS-CoV-2 by FDA under an Emergency Use Authorization (EUA). This EUA will remain  in effect (meaning this test can be used) for the duration of the COVID-19 declaration under Section 564(b)(1) of the Act, 21 U.S.C.section 360bbb-3(b)(1), unless the authorization is terminated  or revoked sooner.       Influenza A by PCR NEGATIVE NEGATIVE Final   Influenza B by PCR NEGATIVE NEGATIVE Final    Comment: (NOTE) The Xpert Xpress SARS-CoV-2/FLU/RSV plus assay is intended as an aid in the diagnosis of influenza from Nasopharyngeal swab specimens and should not be used as a sole basis for treatment. Nasal washings and aspirates are unacceptable for Xpert Xpress  SARS-CoV-2/FLU/RSV testing.  Fact Sheet for Patients: EntrepreneurPulse.com.au  Fact Sheet for Healthcare Providers: IncredibleEmployment.be  This test is not yet approved or cleared by the [place] FDA and has been authorized for detection and/or diagnosis of SARS-CoV-2 by FDA under an Emergency Use Authorization (EUA). This EUA will remain in effect (meaning this test can be used) for the duration of the COVID-19 declaration under Section 564(b)(1) of the Act, 21 U.S.C. section 360bbb-3(b)(1), unless the authorization is terminated or revoked.  Performed at [facility], [address] [place], [place] [address]      Radiology Studies: CT ABDOMEN PELVIS WO CONTRAST  Result Date: 10/01/2020 CLINICAL DATA:  75 year old female with flank pain. Concern for kidney stone. EXAM: CT ABDOMEN AND PELVIS WITHOUT CONTRAST TECHNIQUE: Multidetector CT imaging of the abdomen and pelvis was performed following the standard protocol without IV contrast. COMPARISON:  CT abdomen pelvis dated 02/19/2020. FINDINGS: Evaluation of this exam is limited in the absence of intravenous contrast. Lower chest: The visualized lung bases are clear. Three vessel coronary vascular calcification and postsurgical changes of CABG. No intra-abdominal free air or free fluid. Hepatobiliary: The liver is unremarkable. No intrahepatic biliary ductal dilatation. Cholecystectomy. No retained calcified stone noted in the central CBD. Pancreas: Unremarkable. No pancreatic ductal dilatation or surrounding inflammatory changes. Spleen: Normal in size without focal abnormality. Adrenals/Urinary Tract: The right adrenal gland is not visualized. The left adrenal gland is unremarkable. Status post prior right nephrectomy. There is no hydronephrosis or nephrolithiasis of the left kidney. The left ureter and urinary bladder appear unremarkable. Stomach/Bowel: There is severe colonic  diverticulosis without active inflammatory changes. There is a small hiatal hernia. There is postsurgical changes of the bowel with anastomotic suture in the lower abdomen. There is dilatation of small bowel loops proximal to the anastomosis measuring up to 3.5 cm. The distal small bowel are collapsed. The appendix is unremarkable as visualized. Vascular/Lymphatic: Advanced aortoiliac atherosclerotic disease. The IVC is unremarkable. No portal venous gas. There is no adenopathy. Reproductive: Hysterectomy.  No adnexal masses. Other: Ventral hernia repair mesh. Musculoskeletal: Osteopenia with degenerative changes of the spine. No acute osseous pathology. IMPRESSION: 1. Small-bowel obstruction with transition zone in the lower abdomen at the ileo ileal anastomosis. 2. Severe colonic diverticulosis. 3. Status post prior right nephrectomy. 4. Aortic Atherosclerosis (ICD10-I70.0). Electronically Signed   By: Anner Crete M.D.   On: 10/01/2020 03:11   DG Abd 2 Views  Result Date: 10/01/2020 CLINICAL DATA:  Small bowel obstruction EXAM: ABDOMEN - 2 VIEW COMPARISON:  CT abdomen and pelvis Oct 01, 2020 FINDINGS: Supine and upright images were obtained. Nasogastric tube tip is in the proximal duodenum. There are loops of mildly dilated small bowel without appreciable air-fluid level. No free air evident. Surgical clips in right upper  quadrant. Apparent phleboliths in the pelvis. Mild atelectasis left lung base. Lung bases otherwise clear. IMPRESSION: Nasogastric tube tip in proximal duodenum. Loops of mildly dilated bowel without air-fluid levels. There may be resolving bowel obstruction. No free air evident. Mild left base atelectasis. Lung bases otherwise clear. Electronically Signed   By: Lowella Grip III M.D.   On: 10/01/2020 11:39    Scheduled Meds: . amLODipine  5 mg Oral Daily  . Benzocaine  1 application Mouth/Throat Once  . enoxaparin (LOVENOX) injection  40 mg Subcutaneous Q24H  . ezetimibe  10  mg Oral Daily  . isosorbide mononitrate  30 mg Oral Daily  . metoprolol tartrate  2.5 mg Intravenous Q6H  . rosuvastatin  40 mg Oral Daily   Continuous Infusions: . sodium chloride 100 mL/hr at 10/01/20 0617     LOS: 0 days   Time spent: 40 minutes. More than 50% of the time was spent in counseling/coordination of care  Lorella Nimrod, MD [facility]  If 7PM-7AM, please contact night-coverage [email or web address]  10/01/2020, 1:44 PM   This record has been created using Systems analyst. Errors have been sought and corrected,but may not always be located. Such creation errors do not reflect on the standard of care.

## 2020-10-01 NOTE — Progress Notes (Signed)
PHARMACIST - PHYSICIAN COMMUNICATION  CONCERNING:  Enoxaparin (Lovenox) for DVT Prophylaxis    RECOMMENDATION: Patient was prescribed enoxaparin 30mg  q24 hours for VTE prophylaxis.   Filed Weights   10/01/20 0120 10/01/20 0546  Weight: 72.6 kg (160 lb) 73.7 kg (162 lb 7.7 oz)    Body mass index is 29.72 kg/m.  Estimated Creatinine Clearance: 32.3 mL/min (A) (by C-G formula based on SCr of 1.48 mg/dL (H)).  Patient is candidate for enoxaparin 40mg  every 24 hours based on CrCl >96ml/min and Weight >45kg  DESCRIPTION: Pharmacy has adjusted enoxaparin dose per [facility] policy.  Patient is now receiving enoxaparin 40 mg every 24 hours    Benita Gutter 10/01/2020 8:08 AM

## 2020-10-01 NOTE — ED Provider Notes (Signed)
[facility] Emergency Department Provider Note    Event Date/Time   First MD Initiated Contact with Patient 10/01/20 0142     (approximate)  I have reviewed the triage vital signs and the nursing notes.   HISTORY  Chief Complaint Flank Pain (Pt coming from home with complaints of left flank pain. +nausea and vomiting. 4mg  Zofran given EMS. Hx right kidney cancer)    HPI Debbie Delacruz is a 75 y.o. female extensive past medical history as listed below presents to ER for evaluation of left-sided abdominal pain associate with nausea vomiting woke her up from sleep.  Status post a right nephrectomy.  Feels somewhat similar to previous kidney stones but having more nausea and vomiting.  Has not felt she has been able to pass gas or move her bowels.  Denies any chest pain or pressure.    Past Medical History:  Diagnosis Date  . A-fib ([place])   . Anemia   . Anginal pain ([place])   . Anxiety   . Arthritis   . CAD (coronary artery disease)   . Cancer ([place])   . Carpal tunnel syndrome    bilateral  . Cataract    bil removed cataracts  . Coronary artery disease    2x stents, Dr. Clayborn Bigness  . Depression   . Dysrhythmia   . Family history of adverse reaction to anesthesia    "brother; S/P lipotripsy in [place]; transferred to [facility]; had to put him on life support for 2 days"  . GERD (gastroesophageal reflux disease)   . Headache    "weekly" (09/13/2014)  . History of blood transfusion 05/2014   "we haven't figured out why I needed it yet" (09/13/2014)  . History of hiatal hernia   . Hyperglycemia   . Hyperlipidemia   . Hypertension   . Kidney stone   . Myocardial infarct ([place])   . Myocardial infarct, old 1999   15 years ago  . Obesity   . PONV (postoperative nausea and vomiting)   . Renal mass   . S/P CABG x 4 09/18/2014   LIMA to LAD, SVG to PDA-dRCA sequentially, SVG to OM, EVH via right thigh   . Sleep apnea   . Sleep apnea    "don't  use a mask" (09/13/2014)  . Unstable angina (H[facility])    Family History  Problem Relation Age of Onset  . Coronary artery disease Mother   . Osteoporosis Mother   . Heart disease Mother   . Stroke Mother   . Heart attack Mother   . Hypertension Mother   . Hypertension Father   . Coronary artery disease Father   . COPD Father   . Heart disease Father   . Heart attack Father   . Glaucoma Father   . Osteoporosis Father   . Emphysema Father   . Cancer Sister 38       colon  . Hypertension Sister   . Colon cancer Sister        dx in her 45's  . Breast cancer Sister   . Colon polyps Sister   . Stroke Brother   . Hypertension Brother   . Colon polyps Brother   . Stroke Maternal Grandfather   . Stroke Maternal Grandmother   . Osteoporosis Maternal Grandmother   . Hypertension Maternal Grandmother   . Hypertension Paternal Grandmother   . Hypertension Paternal Grandfather   . Breast cancer Maternal Aunt  2 aunts-50's  . Esophageal cancer Neg Hx   . Stomach cancer Neg Hx   . Rectal cancer Neg Hx   . Pancreatic cancer Neg Hx    Past Surgical History:  Procedure Laterality Date  . ABDOMINAL HYSTERECTOMY    . APPENDECTOMY    . BACK SURGERY    . BLADDER SURGERY    . CARDIAC CATHETERIZATION  06/2014  . CARDIAC CATHETERIZATION N/A 06/16/2016   Procedure: Left Heart Cath and Cors/Grafts Angiography;  Surgeon: Burnell Blanks, MD;  Location: [facility];  Service: Cardiovascular;  Laterality: N/A;  . CARPAL TUNNEL RELEASE Left   . CATARACT EXTRACTION W/ INTRAOCULAR LENS  IMPLANT, BILATERAL    . CHOLECYSTECTOMY    . COLONOSCOPY    . CORONARY ANGIOPLASTY WITH STENT PLACEMENT  1999   [facility] x2 stent  . CORONARY ARTERY BYPASS GRAFT N/A 09/18/2014   Procedure: CORONARY ARTERY BYPASS GRAFTING (CABG)times four using LIMA  to LAD:SVG to  PD and DIST RCA;SVG to OM;, EVH Right thigh;  Surgeon: [name], MD;  Location: [place];  Service: Open Heart Surgery;  Laterality:  N/A;  . CYSTOSCOPY W/ RETROGRADES Left 03/15/2020   Procedure: CYSTOSCOPY WITH RETROGRADE PYELOGRAM;  Surgeon: Billey Co, MD;  Location: [facility];  Service: Urology;  Laterality: Left;  . CYSTOSCOPY WITH STENT PLACEMENT Left 02/20/2020   Procedure: CYSTOSCOPY WITH STENT PLACEMENT;  Surgeon: Billey Co, MD;  Location: [facility];  Service: Urology;  Laterality: Left;  . CYSTOSCOPY/URETEROSCOPY/HOLMIUM LASER/STENT PLACEMENT Left 03/15/2020   Procedure: CYSTOSCOPY/URETEROSCOPY;  Surgeon: Billey Co, MD;  Location: [facility];  Service: Urology;  Laterality: Left;  . ESOPHAGOGASTRODUODENOSCOPY N/A 09/17/2014   Procedure: ESOPHAGOGASTRODUODENOSCOPY (EGD);  Surgeon: Inda Castle, MD;  Location: [place];  Service: Endoscopy;  Laterality: N/A;  . heart stents     1999  . INCONTINENCE SURGERY    . INGUINAL HERNIA REPAIR    . INGUINAL HERNIA REPAIR Right X 3   "last one was in the 1990's; still have hernia there now" (09/13/2014)  . LAPAROSCOPIC CHOLECYSTECTOMY    . LUMBAR DISC SURGERY  1990's?  . LUMBAR SPINE SURGERY    . removal of ovaries    . TEE WITHOUT CARDIOVERSION N/A 09/18/2014   Procedure: TRANSESOPHAGEAL ECHOCARDIOGRAM (TEE);  Surgeon: [name], MD;  Location: [place];  Service: Open Heart Surgery;  Laterality: N/A;  . UPPER GASTROINTESTINAL ENDOSCOPY     Patient Active Problem List   Diagnosis Date Noted  . Renal mass 05/07/2020  . Obesity (BMI 30.0-34.9) 08/30/2019  . Hyperlipidemia 07/17/2019  . Anxiety and depression 10/09/2015  . S/P CABG x 4 09/18/2014  . Barrett's esophagus 09/17/2014  . GERD (gastroesophageal reflux disease)   . CAD (coronary artery disease)   . Hypertension 11/16/2012  . Iron deficiency anemia 11/16/2012  . Arthralgia 11/16/2012  . Screening for breast cancer 11/16/2012  . Special screening for malignant neoplasms, colon 11/16/2012  . Anemia 09/14/2011  . Headache(784.0) 07/30/2011  . Dyslipidemia 09/15/2008  . COLONIC  POLYPS, ADENOMATOUS, HX OF 02/20/2008  . Anxiety 11/04/2006      Prior to Admission medications   Medication Sig Start Date End Date Taking? Authorizing Provider  acetaminophen (TYLENOL) 500 MG tablet Take 500 mg by mouth every 6 (six) hours as needed for headache.    [provider]  amLODipine (NORVASC) 5 MG tablet TAKE 1 TABLET BY MOUTH ONCE DAILY 06/11/20   [name], MD  aspirin EC  81 MG tablet Take 1 tablet (81 mg total) by mouth daily. 07/09/16   Deboraha Sprang, MD  ezetimibe (ZETIA) 10 MG tablet Take 1 tablet (10 mg total) by mouth daily. 09/24/20   [name], MD  isosorbide mononitrate (IMDUR) 30 MG 24 hr tablet TAKE 1 TABLET BY MOUTH ONCE DAILY 09/16/20   [name], MD  lisinopril (ZESTRIL) 10 MG tablet Take 1 tablet (10 mg total) by mouth daily. 07/30/20   [name], MD  metoprolol succinate (TOPROL-XL) 50 MG 24 hr tablet TAKE 1 TABLET BY MOUTH ONCE DAILY 07/30/20   [name], MD  nitroGLYCERIN (NITROSTAT) 0.4 MG SL tablet Place 1 tablet (0.4 mg total) under the tongue every 5 (five) minutes as needed for chest pain. Take for up to 3 doses and then call 911 if still having chest pain. 10/11/19   [name], MD  ondansetron (ZOFRAN) 4 MG tablet Take 1 tablet (4 mg total) by mouth every 8 (eight) hours as needed for up to 10 doses for nausea or vomiting. 02/19/20   Lucrezia Starch, MD  rosuvastatin (CRESTOR) 40 MG tablet TAKE 1 TABLET BY MOUTH ONCE DAILY 09/16/20   [name], MD    Allergies Ferrous sulfate [ferrous fumarate] and Lipitor [atorvastatin]    Social History Social History   Tobacco Use  . Smoking status: Never Smoker  . Smokeless tobacco: Never Used  Vaping Use  . Vaping Use: Never used  Substance Use Topics  . Alcohol use: Not Currently    Alcohol/week: 0.0 standard drinks  . Drug use: No    Review of Systems Patient denies headaches, rhinorrhea, blurry vision, numbness, shortness of breath,  chest pain, edema, cough, abdominal pain, nausea, vomiting, diarrhea, dysuria, fevers, rashes or hallucinations unless otherwise stated above in HPI. ____________________________________________   PHYSICAL EXAM:  VITAL SIGNS: Vitals:   10/01/20 0200 10/01/20 0245  BP: (!) 167/96 (!) 167/79  Pulse: 82 77  Resp: 13 19  Temp:    SpO2: 94% 97%    Constitutional: Alert and oriented.  Eyes: Conjunctivae are normal.  Head: Atraumatic. Nose: No congestion/rhinnorhea. Mouth/Throat: Mucous membranes are moist.   Neck: No stridor. Painless ROM.  Cardiovascular: Normal rate, regular rhythm. Grossly normal heart sounds.  Good peripheral circulation. Respiratory: Normal respiratory effort.  No retractions. Lungs CTAB. Gastrointestinal: Soft with ttp diffusely,  + left cva ttp. No distention. No abdominal bruits. No CVA tenderness. Genitourinary: deferred Musculoskeletal: No lower extremity tenderness nor edema.  No joint effusions. Neurologic:  Normal speech and language. No gross focal neurologic deficits are appreciated. No facial droop Skin:  Skin is warm, dry and intact. No rash noted. Psychiatric: Mood and affect are normal. Speech and behavior are normal.  ____________________________________________   LABS (all labs ordered are listed, but only abnormal results are displayed)  Results for orders placed or performed during the hospital encounter of 10/01/20 (from the past 24 hour(s))  CBC with Differential     Status: Abnormal   Collection Time: 10/01/20  1:19 AM  Result Value Ref Range   WBC 13.4 (H) 4.0 - 10.5 K/uL   RBC 4.19 3.87 - 5.11 MIL/uL   Hemoglobin 11.8 (L) 12.0 - 15.0 g/dL   HCT 37.3 36.0 - 46.0 %   MCV 89.0 80.0 - 100.0 fL   MCH 28.2 26.0 - 34.0 pg   MCHC 31.6 30.0 - 36.0 g/dL   RDW 13.2 11.5 - 15.5 %   Platelets 265  150 - 400 K/uL   nRBC 0.0 0.0 - 0.2 %   Neutrophils Relative % 85 %   Neutro Abs 11.2 (H) 1.7 - 7.7 K/uL   Lymphocytes Relative 9 %   Lymphs Abs  1.2 0.7 - 4.0 K/uL   Monocytes Relative 6 %   Monocytes Absolute 0.9 0.1 - 1.0 K/uL   Eosinophils Relative 0 %   Eosinophils Absolute 0.1 0.0 - 0.5 K/uL   Basophils Relative 0 %   Basophils Absolute 0.0 0.0 - 0.1 K/uL   Immature Granulocytes 0 %   Abs Immature Granulocytes 0.05 0.00 - 0.07 K/uL  Comprehensive metabolic panel     Status: Abnormal   Collection Time: 10/01/20  1:19 AM  Result Value Ref Range   Sodium 136 135 - 145 mmol/L   Potassium 4.4 3.5 - 5.1 mmol/L   Chloride 103 98 - 111 mmol/L   CO2 22 22 - 32 mmol/L   Glucose, Bld 160 (H) 70 - 99 mg/dL   BUN 32 (H) 8 - 23 mg/dL   Creatinine, Ser 1.76 (H) 0.44 - 1.00 mg/dL   Calcium 9.2 8.9 - 10.3 mg/dL   Total Protein 7.4 6.5 - 8.1 g/dL   Albumin 4.2 3.5 - 5.0 g/dL   AST 20 15 - 41 U/L   ALT 14 0 - 44 U/L   Alkaline Phosphatase 55 38 - 126 U/L   Total Bilirubin 0.9 0.3 - 1.2 mg/dL   GFR, Estimated 30 (L) >60 mL/min   Anion gap 11 5 - 15  Lipase, blood     Status: None   Collection Time: 10/01/20  1:19 AM  Result Value Ref Range   Lipase 34 11 - 51 U/L  Lactic acid, plasma     Status: None   Collection Time: 10/01/20  1:46 AM  Result Value Ref Range   Lactic Acid, Venous 1.8 0.5 - 1.9 mmol/L   ____________________________________________  EKG My review and personal interpretation at Time: 1:27   Indication: n/v  Rate: 90  Rhythm: sinus Axis: normal Other: normal intervals, no stemi ____________________________________________  RADIOLOGY  I personally reviewed all radiographic images ordered to evaluate for the above acute complaints and reviewed radiology reports and findings.  These findings were personally discussed with the patient.  Please see medical record for radiology report.  ____________________________________________   PROCEDURES  Procedure(s) performed:  Procedures    Critical Care performed: no ____________________________________________   INITIAL IMPRESSION / ASSESSMENT AND PLAN / ED  COURSE  Pertinent labs & imaging results that were available during my care of the patient were reviewed by me and considered in my medical decision making (see chart for details).   DDX: SBO, stone, pyelonephritis, diverticulitis, colitis, dehydration, electrolyte abnormality  Debbie Delacruz is a 75 y.o. who presents to the ED with sudden onset abdominal pain nausea vomiting do not feel like she is passing gas here to move her bowels.  Will give IV fluids.  CT imaging will be ordered for by differential.  Will give IV pain medication as well as antiemetic.  Clinical Course as of 10/01/20 0333  Tue Oct 01, 2020  0325 CT imaging with evidence of small bowel obstruction with transition point.  Will order NG tube placement.  Will consult surgery.  Discussed with hospitalist for admission.  Patient updated.  Agreeable to plan. [PR]    Clinical Course User Index [PR] Merlyn Lot, MD    The patient was evaluated in Emergency Department today for the  symptoms described in the history of present illness. He/she was evaluated in the context of the global COVID-19 pandemic, which necessitated consideration that the patient might be at risk for infection with the SARS-CoV-2 virus that causes COVID-19. Institutional protocols and algorithms that pertain to the evaluation of patients at risk for COVID-19 are in a state of rapid change based on information released by regulatory bodies including the CDC and federal and state organizations. These policies and algorithms were followed during the patient's care in the ED.  As part of my medical decision making, I reviewed the following data within the [place] notes reviewed and incorporated, Labs reviewed, notes from prior ED visits and [place] Controlled Substance Database   ____________________________________________   FINAL CLINICAL IMPRESSION(S) / ED DIAGNOSES  Final diagnoses:  SBO (small bowel obstruction) ([place])       NEW MEDICATIONS STARTED DURING THIS VISIT:  New Prescriptions   No medications on file     Note:  This document was prepared using Dragon voice recognition software and may include unintentional dictation errors.    Merlyn Lot, MD 10/01/20 [phone]

## 2020-10-01 NOTE — H&P (Addendum)
[place]   PATIENT NAME: Debbie Delacruz    MR#:  [id number]  DATE OF BIRTH:  02/21/1946  DATE OF ADMISSION:  10/01/2020  PRIMARY CARE PHYSICIAN: [name], MD   Patient is coming from: Home.  REQUESTING/REFERRING PHYSICIAN: Merlyn Lot, MD CHIEF COMPLAINT:   Chief Complaint  Patient presents with  . Flank Pain    Pt coming from home with complaints of left flank pain. +nausea and vomiting. 4mg  Zofran given EMS. Hx right kidney cancer    HISTORY OF PRESENT ILLNESS:  Debbie Delacruz is a 75 y.o. female with medical history significant for coronary artery disease, atrial fibrillation, depression, GERD, hypertension, dyslipidemia and sleep apnea, who presented to the emergency room with acute onset of lower abdominal abdominal and left flank pain as well as recurrent nausea and vomiting.  For the last day, with no fever or chills.  No dysuria, hematuria or flank pain.  No bilious vomitus or hematemesis.  No melena or bright red bleeding per rectum.  No chest pain or dyspnea or cough or wheezing.  No palpitations.  ED Course: Upon presentation to the ER blood pressure was 182/86 with otherwise normal vital signs.  Labs revealed a BUN of 13 creatinine 1.76 compared to 30/1.55 on 09/23/2020.  CBC showed etc. 13.4 with relative neutrophilia.  Lactic acid was 1.8. EKG as reviewed by me :Showed sinus rhythm with a rate of 87 with PVCs Imaging: Abdominal and pelvic CT scan showed the following: 1. Small-bowel obstruction with transition zone in the lower abdomen at the ileo ileal anastomosis. 2. Severe colonic diverticulosis. 3. Status post prior right nephrectomy. 4. Aortic Atherosclerosis.  The patient was given 4 mg IV Zofran and 500 mill IV normal saline bolus followed by 100 mL/h in addition to 4 mg IV morphine sulfate.  Dr. Celine Ahr was notified about the patient.  She will be admitted to medical  bed for further evaluation and management.Marland Kitchen PAST MEDICAL  HISTORY:   Past Medical History:  Diagnosis Date  . A-fib ([place])   . Anemia   . Anginal pain ([place])   . Anxiety   . Arthritis   . CAD (coronary artery disease)   . Cancer ([place])   . Carpal tunnel syndrome    bilateral  . Cataract    bil removed cataracts  . Coronary artery disease    2x stents, Dr. Clayborn Bigness  . Depression   . Dysrhythmia   . Family history of adverse reaction to anesthesia    "brother; S/P lipotripsy in [place]; transferred to [facility]; had to put him on life support for 2 days"  . GERD (gastroesophageal reflux disease)   . Headache    "weekly" (09/13/2014)  . History of blood transfusion 05/2014   "we haven't figured out why I needed it yet" (09/13/2014)  . History of hiatal hernia   . Hyperglycemia   . Hyperlipidemia   . Hypertension   . Kidney stone   . Myocardial infarct ([place])   . Myocardial infarct, old 1999   15 years ago  . Obesity   . PONV (postoperative nausea and vomiting)   . Renal mass   . S/P CABG x 4 09/18/2014   LIMA to LAD, SVG to PDA-dRCA sequentially, SVG to OM, EVH via right thigh   . Sleep apnea   . Sleep apnea    "don't use a mask" (09/13/2014)  . Unstable angina ([facility])     PAST SURGICAL HISTORY:  Past Surgical History:  Procedure Laterality Date  . ABDOMINAL HYSTERECTOMY    . APPENDECTOMY    . BACK SURGERY    . BLADDER SURGERY    . CARDIAC CATHETERIZATION  06/2014  . CARDIAC CATHETERIZATION N/A 06/16/2016   Procedure: Left Heart Cath and Cors/Grafts Angiography;  Surgeon: Burnell Blanks, MD;  Location: [facility];  Service: Cardiovascular;  Laterality: N/A;  . CARPAL TUNNEL RELEASE Left   . CATARACT EXTRACTION W/ INTRAOCULAR LENS  IMPLANT, BILATERAL    . CHOLECYSTECTOMY    . COLONOSCOPY    . CORONARY ANGIOPLASTY WITH STENT PLACEMENT  1999   [facility] x2 stent  . CORONARY ARTERY BYPASS GRAFT N/A 09/18/2014   Procedure: CORONARY ARTERY BYPASS GRAFTING (CABG)times four using LIMA  to LAD:SVG to  PD and DIST  RCA;SVG to OM;, EVH Right thigh;  Surgeon: [name], MD;  Location: [facility];  Service: Open Heart Surgery;  Laterality: N/A;  . CYSTOSCOPY W/ RETROGRADES Left 03/15/2020   Procedure: CYSTOSCOPY WITH RETROGRADE PYELOGRAM;  Surgeon: Billey Co, MD;  Location: [facility];  Service: Urology;  Laterality: Left;  . CYSTOSCOPY WITH STENT PLACEMENT Left 02/20/2020   Procedure: CYSTOSCOPY WITH STENT PLACEMENT;  Surgeon: Billey Co, MD;  Location: [facility];  Service: Urology;  Laterality: Left;  . CYSTOSCOPY/URETEROSCOPY/HOLMIUM LASER/STENT PLACEMENT Left 03/15/2020   Procedure: CYSTOSCOPY/URETEROSCOPY;  Surgeon: Billey Co, MD;  Location: [facility];  Service: Urology;  Laterality: Left;  . ESOPHAGOGASTRODUODENOSCOPY N/A 09/17/2014   Procedure: ESOPHAGOGASTRODUODENOSCOPY (EGD);  Surgeon: Inda Castle, MD;  Location: [facility];  Service: Endoscopy;  Laterality: N/A;  . heart stents     1999  . INCONTINENCE SURGERY    . INGUINAL HERNIA REPAIR    . INGUINAL HERNIA REPAIR Right X 3   "last one was in the 1990's; still have hernia there now" (09/13/2014)  . LAPAROSCOPIC CHOLECYSTECTOMY    . LUMBAR DISC SURGERY  1990's?  . LUMBAR SPINE SURGERY    . removal of ovaries    . TEE WITHOUT CARDIOVERSION N/A 09/18/2014   Procedure: TRANSESOPHAGEAL ECHOCARDIOGRAM (TEE);  Surgeon: [name], MD;  Location: [place];  Service: Open Heart Surgery;  Laterality: N/A;  . UPPER GASTROINTESTINAL ENDOSCOPY      SOCIAL HISTORY:   Social History   Tobacco Use  . Smoking status: Never Smoker  . Smokeless tobacco: Never Used  Substance Use Topics  . Alcohol use: Not Currently    Alcohol/week: 0.0 standard drinks    FAMILY HISTORY:   Family History  Problem Relation Age of Onset  . Coronary artery disease Mother   . Osteoporosis Mother   . Heart disease Mother   . Stroke Mother   . Heart attack Mother   . Hypertension Mother   . Hypertension Father   . Coronary artery disease Father    . COPD Father   . Heart disease Father   . Heart attack Father   . Glaucoma Father   . Osteoporosis Father   . Emphysema Father   . Cancer Sister 12       colon  . Hypertension Sister   . Colon cancer Sister        dx in her 2's  . Breast cancer Sister   . Colon polyps Sister   . Stroke Brother   . Hypertension Brother   . Colon polyps Brother   . Stroke Maternal Grandfather   . Stroke Maternal Grandmother   . Osteoporosis Maternal  Grandmother   . Hypertension Maternal Grandmother   . Hypertension Paternal Grandmother   . Hypertension Paternal Grandfather   . Breast cancer Maternal Aunt        2 aunts-50's  . Esophageal cancer Neg Hx   . Stomach cancer Neg Hx   . Rectal cancer Neg Hx   . Pancreatic cancer Neg Hx     DRUG ALLERGIES:   Allergies  Allergen Reactions  . Ferrous Sulfate [Ferrous Fumarate] Other (See Comments)    STOMACH ISSUES  . Lipitor [Atorvastatin] Other (See Comments)    myalgia    REVIEW OF SYSTEMS:   ROS As per history of present illness. All pertinent systems were reviewed above. Constitutional, HEENT, cardiovascular, respiratory, GI, GU, musculoskeletal, neuro, psychiatric, endocrine, integumentary and hematologic systems were reviewed and are otherwise negative/unremarkable except for positive findings mentioned above in the HPI.   MEDICATIONS AT HOME:   Prior to Admission medications   Medication Sig Start Date End Date Taking? Authorizing Provider  acetaminophen (TYLENOL) 500 MG tablet Take 500 mg by mouth every 6 (six) hours as needed for headache.   Yes [provider]  amLODipine (NORVASC) 5 MG tablet TAKE 1 TABLET BY MOUTH ONCE DAILY 06/11/20  Yes [name], MD  aspirin EC 81 MG tablet Take 1 tablet (81 mg total) by mouth daily. 07/09/16  Yes Deboraha Sprang, MD  ezetimibe (ZETIA) 10 MG tablet Take 1 tablet (10 mg total) by mouth daily. 09/24/20  Yes [name], MD  isosorbide mononitrate (IMDUR) 30 MG 24 hr  tablet TAKE 1 TABLET BY MOUTH ONCE DAILY 09/16/20  Yes [name], MD  lisinopril (ZESTRIL) 10 MG tablet Take 1 tablet (10 mg total) by mouth daily. 07/30/20  Yes [name], MD  metoprolol succinate (TOPROL-XL) 50 MG 24 hr tablet TAKE 1 TABLET BY MOUTH ONCE DAILY 07/30/20  Yes [name], MD  ondansetron (ZOFRAN) 4 MG tablet Take 1 tablet (4 mg total) by mouth every 8 (eight) hours as needed for up to 10 doses for nausea or vomiting. 02/19/20  Yes Lucrezia Starch, MD  rosuvastatin (CRESTOR) 40 MG tablet TAKE 1 TABLET BY MOUTH ONCE DAILY 09/16/20  Yes [name], MD  nitroGLYCERIN (NITROSTAT) 0.4 MG SL tablet Place 1 tablet (0.4 mg total) under the tongue every 5 (five) minutes as needed for chest pain. Take for up to 3 doses and then call 911 if still having chest pain. 10/11/19   [name], MD      VITAL SIGNS:  Blood pressure (!) 167/79, pulse 77, temperature 98.2 F (36.8 C), temperature source Oral, resp. rate 19, height 5\' 2"  (1.575 m), weight 72.6 kg, SpO2 97 %.  PHYSICAL EXAMINATION:  Physical Exam  GENERAL:  75 y.o.-year-old patient lying in the bed with no acute distress.  EYES: Pupils equal, round, reactive to light and accommodation. No scleral icterus. Extraocular muscles intact.  HEENT: Head atraumatic, normocephalic. Oropharynx and nasopharynx clear.  NECK:  Supple, no jugular venous distention. No thyroid enlargement, no tenderness.  LUNGS: Normal breath sounds bilaterally, no wheezing, rales,rhonchi or crepitation. No use of accessory muscles of respiration.  CARDIOVASCULAR: Regular rate and rhythm, S1, S2 normal. No murmurs, rubs, or gallops.  ABDOMEN: Soft, nondistended, mild lower abdominal tenderness without rebound tenderness guarding or rigidity.  Bowel sounds were very diminished throughout.  No organomegaly or mass.  EXTREMITIES: No pedal edema, cyanosis, or clubbing.  NEUROLOGIC: Cranial nerves II through XII are  intact. Muscle  strength 5/5 in all extremities. Sensation intact. Gait not checked.  PSYCHIATRIC: The patient is alert and oriented x 3.  Normal affect and good eye contact. SKIN: No obvious rash, lesion, or ulcer.   LABORATORY PANEL:   CBC Recent Labs  Lab 10/01/20 0119  WBC 13.4*  HGB 11.8*  HCT 37.3  PLT 265   ------------------------------------------------------------------------------------------------------------------  Chemistries  Recent Labs  Lab 10/01/20 0119  NA 136  K 4.4  CL 103  CO2 22  GLUCOSE 160*  BUN 32*  CREATININE 1.76*  CALCIUM 9.2  AST 20  ALT 14  ALKPHOS 55  BILITOT 0.9   ------------------------------------------------------------------------------------------------------------------  Cardiac Enzymes No results for input(s): TROPONINI in the last 168 hours. ------------------------------------------------------------------------------------------------------------------  RADIOLOGY:  CT ABDOMEN PELVIS WO CONTRAST  Result Date: 10/01/2020 CLINICAL DATA:  75 year old female with flank pain. Concern for kidney stone. EXAM: CT ABDOMEN AND PELVIS WITHOUT CONTRAST TECHNIQUE: Multidetector CT imaging of the abdomen and pelvis was performed following the standard protocol without IV contrast. COMPARISON:  CT abdomen pelvis dated 02/19/2020. FINDINGS: Evaluation of this exam is limited in the absence of intravenous contrast. Lower chest: The visualized lung bases are clear. Three vessel coronary vascular calcification and postsurgical changes of CABG. No intra-abdominal free air or free fluid. Hepatobiliary: The liver is unremarkable. No intrahepatic biliary ductal dilatation. Cholecystectomy. No retained calcified stone noted in the central CBD. Pancreas: Unremarkable. No pancreatic ductal dilatation or surrounding inflammatory changes. Spleen: Normal in size without focal abnormality. Adrenals/Urinary Tract: The right adrenal gland is not visualized. The left adrenal  gland is unremarkable. Status post prior right nephrectomy. There is no hydronephrosis or nephrolithiasis of the left kidney. The left ureter and urinary bladder appear unremarkable. Stomach/Bowel: There is severe colonic diverticulosis without active inflammatory changes. There is a small hiatal hernia. There is postsurgical changes of the bowel with anastomotic suture in the lower abdomen. There is dilatation of small bowel loops proximal to the anastomosis measuring up to 3.5 cm. The distal small bowel are collapsed. The appendix is unremarkable as visualized. Vascular/Lymphatic: Advanced aortoiliac atherosclerotic disease. The IVC is unremarkable. No portal venous gas. There is no adenopathy. Reproductive: Hysterectomy.  No adnexal masses. Other: Ventral hernia repair mesh. Musculoskeletal: Osteopenia with degenerative changes of the spine. No acute osseous pathology. IMPRESSION: 1. Small-bowel obstruction with transition zone in the lower abdomen at the ileo ileal anastomosis. 2. Severe colonic diverticulosis. 3. Status post prior right nephrectomy. 4. Aortic Atherosclerosis (ICD10-I70.0). Electronically Signed   By: Anner Crete M.D.   On: 10/01/2020 03:11      IMPRESSION AND PLAN:  Active Problems:   SBO (small bowel obstruction) ([place])  1.  Small bowel obstruction. - The patient will be admitted to the medical bed. - We will place an NG tube to low suction. - Pain management will be provided. - Dr. Celine Ahr was notified about the patient and is aware. - Conservative management with NG tube, IV fluids will be monitored. - 2 view abdomen x-ray will be followed  2.  Acute kidney injury superimposed on stage IIIb chronic kidney disease. - The patient will be hydrated with IV normal saline and serial BMPs will be involving.  3.  Essential hypertension. - Continue Norvasc, Toprol-XL and Imdur and hold off Zestril.  4.  Coronary artery disease. - We will continue statin therapy as well as  beta-blocker therapy and as needed sublingual nitroglycerin as well as Imdur.  5.  Dyslipidemia. - Continue statin therapy.  DVT  prophylaxis: Lovenox. Code Status: full code. Family Communication:  The plan of care was discussed in details with the patient (requested no other family members). I answered all questions. The patient agreed to proceed with the above mentioned plan. Further management will depend upon hospital course. Disposition Plan: Back to previous home environment Consults called: General surgery All the records are reviewed and case discussed with ED provider.  Status is: Inpatient  Remains inpatient appropriate because:Ongoing active pain requiring inpatient pain management, Unsafe d/c plan, IV treatments appropriate due to intensity of illness or inability to take PO and Inpatient level of care appropriate due to severity of illness   Dispo: The patient is from: Home              Anticipated d/c is to: Home              Patient currently is not medically stable to d/c.   Difficult to place patient No      TOTAL TIME TAKING CARE OF THIS PATIENT: 55 minutes.    Christel Mormon M.D on 10/01/2020 at 4:01 AM  [facility]   From 7 PM-7 AM, contact night-coverage [email or web address]  CC: Primary care physician; [name], MD

## 2020-10-01 NOTE — Progress Notes (Signed)
Briefly, this is a 75 y/o F with multiple prior abdominal operations presenting to ED with 24h of abdominal pain, n/v. CT suggests SBO/pSBO with transition point at a prior bowel anastomosis.  Recommend admission to hospitalist service.  NPO, NGT, IVF.  Full general surgery consult to follow.

## 2020-10-02 ENCOUNTER — Inpatient Hospital Stay: Payer: Medicare Other

## 2020-10-02 DIAGNOSIS — K56609 Unspecified intestinal obstruction, unspecified as to partial versus complete obstruction: Secondary | ICD-10-CM | POA: Diagnosis not present

## 2020-10-02 DIAGNOSIS — Z9289 Personal history of other medical treatment: Secondary | ICD-10-CM | POA: Diagnosis not present

## 2020-10-02 LAB — CBC
HCT: 32.3 % — ABNORMAL LOW (ref 36.0–46.0)
Hemoglobin: 10.2 g/dL — ABNORMAL LOW (ref 12.0–15.0)
MCH: 28.3 pg (ref 26.0–34.0)
MCHC: 31.6 g/dL (ref 30.0–36.0)
MCV: 89.5 fL (ref 80.0–100.0)
Platelets: 204 10*3/uL (ref 150–400)
RBC: 3.61 MIL/uL — ABNORMAL LOW (ref 3.87–5.11)
RDW: 13.2 % (ref 11.5–15.5)
WBC: 8.6 10*3/uL (ref 4.0–10.5)
nRBC: 0 % (ref 0.0–0.2)

## 2020-10-02 LAB — BASIC METABOLIC PANEL
Anion gap: 7 (ref 5–15)
BUN: 31 mg/dL — ABNORMAL HIGH (ref 8–23)
CO2: 21 mmol/L — ABNORMAL LOW (ref 22–32)
Calcium: 7.9 mg/dL — ABNORMAL LOW (ref 8.9–10.3)
Chloride: 110 mmol/L (ref 98–111)
Creatinine, Ser: 1.36 mg/dL — ABNORMAL HIGH (ref 0.44–1.00)
GFR, Estimated: 41 mL/min — ABNORMAL LOW (ref >60)
Glucose, Bld: 91 mg/dL (ref 70–99)
Potassium: 4.1 mmol/L (ref 3.5–5.1)
Sodium: 138 mmol/L (ref 135–145)

## 2020-10-02 LAB — MAGNESIUM: Magnesium: 2 mg/dL (ref 1.7–2.4)

## 2020-10-02 MED ORDER — MENTHOL 3 MG MT LOZG
1.0000 | LOZENGE | OROMUCOSAL | Status: DC | PRN
  Administered 2020-10-02 – 2020-10-03 (×4): 3 mg via ORAL
  Filled 2020-10-02 (×2): qty 9

## 2020-10-02 MED ORDER — PANTOPRAZOLE SODIUM 40 MG IV SOLR
40.0000 mg | INTRAVENOUS | Status: DC
  Administered 2020-10-02 – 2020-10-03 (×2): 40 mg via INTRAVENOUS
  Filled 2020-10-02 (×2): qty 40

## 2020-10-02 MED ORDER — CALCIUM GLUCONATE-NACL 1-0.675 GM/50ML-% IV SOLN
1.0000 g | Freq: Once | INTRAVENOUS | Status: AC
  Administered 2020-10-02: 1000 mg via INTRAVENOUS
  Filled 2020-10-02: qty 50

## 2020-10-02 MED ORDER — PHENOL 1.4 % MT LIQD
1.0000 | OROMUCOSAL | Status: DC | PRN
  Administered 2020-10-02: 1 via OROMUCOSAL
  Filled 2020-10-02 (×2): qty 177

## 2020-10-02 NOTE — Progress Notes (Signed)
PROGRESS NOTE    Debbie Delacruz  DGU:[id number] DOB: Nov 01, 1945 DOA: 10/01/2020 PCP: [name], MD   Brief Narrative: Taken from H&P. Debbie Delacruz is a 75 y.o. female with medical history significant for coronary artery disease, atrial fibrillation, depression, GERD, hypertension, dyslipidemia and sleep apnea, who presented to the emergency room with acute onset of lower abdominal abdominal and left flank pain as well as recurrent nausea and vomiting.  CT abdomen concerning for small bowel obstruction with transition zone in the lower abdomenAt the ileal ileal anastomosis.  Patient has an history of multiple abdominal surgeries including right nephrectomy.  NG tube was placed with improvement in her symptoms. General surgery was consulted and they will try conservative management at this time and if failed she will go to the OR for exploratory laparotomy. Repeat KUB today with mild improvement, continue to have air-fluid levels with nondilated bowels.  Subjective: Patient was getting ready to go for abdominal x-ray when seen today.  Denies any abdominal pain, nausea or vomiting.  Having some inconsistent flatus, no bowel movement.  Assessment & Plan:   Active Problems:   SBO (small bowel obstruction) (HCC)  Small bowel obstruction.  Most likely secondary to adhesions with history of multiple abdominal surgeries.  Surgery would like to try conservative management before and if failed then she will go to the OR for exploratory laparotomy.  Mild improvement on repeat KUB-surgery is recommending continuation of current management. -Keep patient n.p.o. -Continue with IV fluid -Continue with NG tube and suctioning. -Monitor for any spontaneous resolution of small bowel obstruction.  AKI with CKD stage IIIa.  Baseline creatinine around 1 according to the labs done in October and November 2021.  Most likely secondary to dehydration with GI losses, creatinine improving with IV  fluid. -Monitor renal function -Avoid nephrotoxins  Essential hypertension.  Blood pressure mildly elevated. -Continue with Norvasc, Imdur and Toprol. -Keep holding Zestril due to AKI.  Coronary artery disease.  No chest pain. -Continue with beta-blocker-has been converted to IV. -We will restart statin and Imdur once to start taking p.o. -Continue with sublingual nitroglycerin as needed.  Dyslipidemia. -Home dose of statin currently on hold as patient is n.p.o.  Objective: Vitals:   10/01/20 2301 10/02/20 0340 10/02/20 0838 10/02/20 1120  BP: 134/80 (!) 151/77 (!) 154/84 (!) 155/92  Pulse: 98 93 88 91  Resp: 16 16 18 16   Temp: 99.1 F (37.3 C) 99.5 F (37.5 C) 99.2 F (37.3 C) 99.4 F (37.4 C)  TempSrc: Oral Oral Oral Oral  SpO2: 97% 96% 96% 96%  Weight:      Height:        Intake/Output Summary (Last 24 hours) at 10/02/2020 1456 Last data filed at 10/02/2020 0749 Gross per 24 hour  Intake 2014.88 ml  Output 900 ml  Net 1114.88 ml   Filed Weights   10/01/20 0120 10/01/20 0546  Weight: 72.6 kg 73.7 kg    Examination:  General.  Well-developed lady, in no acute distress. Pulmonary.  Lungs clear bilaterally, normal respiratory effort. CV.  Regular rate and rhythm, no JVD, rub or murmur. Abdomen.  Soft, nontender, nondistended, BS positive. CNS.  Alert and oriented x3.  No focal neurologic deficit. Extremities.  No edema, no cyanosis, pulses intact and symmetrical. Psychiatry.  Judgment and insight appears normal.  DVT prophylaxis: Lovenox Code Status: Full Family Communication: Husband was updated at bedside. Disposition Plan:  Status is: Inpatient  Remains inpatient appropriate because:Inpatient level of care appropriate due  to severity of illness   Dispo: The patient is from: Home              Anticipated d/c is to: Home              Patient currently is not medically stable to d/c.   Difficult to place patient No                Level of care:  Med-Surg  All the records are reviewed and case discussed with Care Management/Social Worker. Management plans discussed with the patient, nursing and they are in agreement.  Consultants:   General surgery  Procedures:  Antimicrobials:   Data Reviewed: I have personally reviewed following labs and imaging studies  CBC: Recent Labs  Lab 10/01/20 0119 10/01/20 0515 10/02/20 0331  WBC 13.4* 15.3* 8.6  NEUTROABS 11.2*  --   --   HGB 11.8* 11.5* 10.2*  HCT 37.3 35.9* 32.3*  MCV 89.0 88.6 89.5  PLT 265 221 703   Basic Metabolic Panel: Recent Labs  Lab 10/01/20 0119 10/01/20 0515 10/02/20 0331  NA 136 136 138  K 4.4 4.6 4.1  CL 103 106 110  CO2 22 21* 21*  GLUCOSE 160* 151* 91  BUN 32* 34* 31*  CREATININE 1.76* 1.48* 1.36*  CALCIUM 9.2 8.7* 7.9*  MG  --  2.2 2.0   GFR: Estimated Creatinine Clearance: 35.1 mL/min (A) (by C-G formula based on SCr of 1.36 mg/dL (H)). Liver Function Tests: Recent Labs  Lab 10/01/20 0119  AST 20  ALT 14  ALKPHOS 55  BILITOT 0.9  PROT 7.4  ALBUMIN 4.2   Recent Labs  Lab 10/01/20 0119  LIPASE 34   No results for input(s): AMMONIA in the last 168 hours. Coagulation Profile: No results for input(s): INR, PROTIME in the last 168 hours. Cardiac Enzymes: No results for input(s): CKTOTAL, CKMB, CKMBINDEX, TROPONINI in the last 168 hours. BNP (last 3 results) No results for input(s): PROBNP in the last 8760 hours. HbA1C: No results for input(s): HGBA1C in the last 72 hours. CBG: No results for input(s): GLUCAP in the last 168 hours. Lipid Profile: No results for input(s): CHOL, HDL, LDLCALC, TRIG, CHOLHDL, LDLDIRECT in the last 72 hours. Thyroid Function Tests: No results for input(s): TSH, T4TOTAL, FREET4, T3FREE, THYROIDAB in the last 72 hours. Anemia Panel: No results for input(s): VITAMINB12, FOLATE, FERRITIN, TIBC, IRON, RETICCTPCT in the last 72 hours. Sepsis Labs: Recent Labs  Lab 10/01/20 0146  LATICACIDVEN 1.8     Recent Results (from the past 240 hour(s))  Resp Panel by RT-PCR (Flu A&B, Covid) Nasopharyngeal Swab     Status: None   Collection Time: 10/01/20  4:22 AM   Specimen: Nasopharyngeal Swab; Nasopharyngeal(NP) swabs in vial transport medium  Result Value Ref Range Status   SARS Coronavirus 2 by RT PCR NEGATIVE NEGATIVE Final    Comment: (NOTE) SARS-CoV-2 target nucleic acids are NOT DETECTED.  The SARS-CoV-2 RNA is generally detectable in upper respiratory specimens during the acute phase of infection. The lowest concentration of SARS-CoV-2 viral copies this assay can detect is 138 copies/mL. A negative result does not preclude SARS-Cov-2 infection and should not be used as the sole basis for treatment or other patient management decisions. A negative result may occur with  improper specimen collection/handling, submission of specimen other than nasopharyngeal swab, presence of viral mutation(s) within the areas targeted by this assay, and inadequate number of viral copies(<138 copies/mL). A negative result must be combined  with clinical observations, patient history, and epidemiological information. The expected result is Negative.  Fact Sheet for Patients:  EntrepreneurPulse.com.au  Fact Sheet for Healthcare Providers:  IncredibleEmployment.be  This test is no t yet approved or cleared by the [place] FDA and  has been authorized for detection and/or diagnosis of SARS-CoV-2 by FDA under an Emergency Use Authorization (EUA). This EUA will remain  in effect (meaning this test can be used) for the duration of the COVID-19 declaration under Section 564(b)(1) of the Act, 21 U.S.C.section 360bbb-3(b)(1), unless the authorization is terminated  or revoked sooner.       Influenza A by PCR NEGATIVE NEGATIVE Final   Influenza B by PCR NEGATIVE NEGATIVE Final    Comment: (NOTE) The Xpert Xpress SARS-CoV-2/FLU/RSV plus assay is intended as an  aid in the diagnosis of influenza from Nasopharyngeal swab specimens and should not be used as a sole basis for treatment. Nasal washings and aspirates are unacceptable for Xpert Xpress SARS-CoV-2/FLU/RSV testing.  Fact Sheet for Patients: EntrepreneurPulse.com.au  Fact Sheet for Healthcare Providers: IncredibleEmployment.be  This test is not yet approved or cleared by the [place] FDA and has been authorized for detection and/or diagnosis of SARS-CoV-2 by FDA under an Emergency Use Authorization (EUA). This EUA will remain in effect (meaning this test can be used) for the duration of the COVID-19 declaration under Section 564(b)(1) of the Act, 21 U.S.C. section 360bbb-3(b)(1), unless the authorization is terminated or revoked.  Performed at [facility], [address] [place], [place] [address]      Radiology Studies: CT ABDOMEN PELVIS WO CONTRAST  Result Date: 10/01/2020 CLINICAL DATA:  75 year old female with flank pain. Concern for kidney stone. EXAM: CT ABDOMEN AND PELVIS WITHOUT CONTRAST TECHNIQUE: Multidetector CT imaging of the abdomen and pelvis was performed following the standard protocol without IV contrast. COMPARISON:  CT abdomen pelvis dated 02/19/2020. FINDINGS: Evaluation of this exam is limited in the absence of intravenous contrast. Lower chest: The visualized lung bases are clear. Three vessel coronary vascular calcification and postsurgical changes of CABG. No intra-abdominal free air or free fluid. Hepatobiliary: The liver is unremarkable. No intrahepatic biliary ductal dilatation. Cholecystectomy. No retained calcified stone noted in the central CBD. Pancreas: Unremarkable. No pancreatic ductal dilatation or surrounding inflammatory changes. Spleen: Normal in size without focal abnormality. Adrenals/Urinary Tract: The right adrenal gland is not visualized. The left adrenal gland is unremarkable. Status post prior  right nephrectomy. There is no hydronephrosis or nephrolithiasis of the left kidney. The left ureter and urinary bladder appear unremarkable. Stomach/Bowel: There is severe colonic diverticulosis without active inflammatory changes. There is a small hiatal hernia. There is postsurgical changes of the bowel with anastomotic suture in the lower abdomen. There is dilatation of small bowel loops proximal to the anastomosis measuring up to 3.5 cm. The distal small bowel are collapsed. The appendix is unremarkable as visualized. Vascular/Lymphatic: Advanced aortoiliac atherosclerotic disease. The IVC is unremarkable. No portal venous gas. There is no adenopathy. Reproductive: Hysterectomy.  No adnexal masses. Other: Ventral hernia repair mesh. Musculoskeletal: Osteopenia with degenerative changes of the spine. No acute osseous pathology. IMPRESSION: 1. Small-bowel obstruction with transition zone in the lower abdomen at the ileo ileal anastomosis. 2. Severe colonic diverticulosis. 3. Status post prior right nephrectomy. 4. Aortic Atherosclerosis (ICD10-I70.0). Electronically Signed   By: Anner Crete M.D.   On: 10/01/2020 03:11   DG Abd 2 Views  Result Date: 10/02/2020 CLINICAL DATA:  Small-bowel obstruction. EXAM: ABDOMEN - 2 VIEW  COMPARISON:  Radiographs and CT 10/01/2020. FINDINGS: Nasogastric tube terminates at the level of the mid stomach. The degree of small-bowel distention appears mildly improved, although there are persistent scattered air-fluid levels on the erect examination. The colon appears decompressed with moderate stool distally. No evidence of bowel wall thickening, pneumatosis or free air. Bilateral pelvic calcifications are stable. Cholecystectomy clips and prior median sternotomy are noted. IMPRESSION: Slight improvement in the degree of small bowel distension with persistent scattered air-fluid levels consistent with improving small bowel obstruction. Enteric tube projects over the mid  stomach. Electronically Signed   By: Richardean Sale M.D.   On: 10/02/2020 13:33   DG Abd 2 Views  Result Date: 10/01/2020 CLINICAL DATA:  Small bowel obstruction EXAM: ABDOMEN - 2 VIEW COMPARISON:  CT abdomen and pelvis Oct 01, 2020 FINDINGS: Supine and upright images were obtained. Nasogastric tube tip is in the proximal duodenum. There are loops of mildly dilated small bowel without appreciable air-fluid level. No free air evident. Surgical clips in right upper quadrant. Apparent phleboliths in the pelvis. Mild atelectasis left lung base. Lung bases otherwise clear. IMPRESSION: Nasogastric tube tip in proximal duodenum. Loops of mildly dilated bowel without air-fluid levels. There may be resolving bowel obstruction. No free air evident. Mild left base atelectasis. Lung bases otherwise clear. Electronically Signed   By: Lowella Grip III M.D.   On: 10/01/2020 11:39    Scheduled Meds: . amLODipine  5 mg Oral Daily  . Benzocaine  1 application Mouth/Throat Once  . enoxaparin (LOVENOX) injection  40 mg Subcutaneous Q24H  . ezetimibe  10 mg Oral Daily  . isosorbide mononitrate  30 mg Oral Daily  . metoprolol tartrate  2.5 mg Intravenous Q6H  . pantoprazole (PROTONIX) IV  40 mg Intravenous Q24H  . rosuvastatin  40 mg Oral Daily   Continuous Infusions: . sodium chloride 100 mL/hr at 10/02/20 1318     LOS: 1 day   Time spent: 35 minutes. More than 50% of the time was spent in counseling/coordination of care  Lorella Nimrod, MD [facility]  If 7PM-7AM, please contact night-coverage [email or web address]  10/02/2020, 2:56 PM   This record has been created using Systems analyst. Errors have been sought and corrected,but may not always be located. Such creation errors do not reflect on the standard of care.

## 2020-10-02 NOTE — Progress Notes (Addendum)
[facility] SURGICAL PROGRESS NOTE (cpt [phone])  Hospital Day(s): 1.   Interval History: Patient seen and examined, no acute events or new complaints overnight. Patient reports her main complaint is sore throat from her NGT. She denies fever, chills, nausea, emesis. Her leukocytosis is resolved this morning; now 8.6K. Her renal function is improving; sCr - 1.36; UO - unmeasured. No electrolyte derangements. NGT output in the last 24 hours recorded at 700 ccs. She did report a small episode of flatus this morning but nothing consistent.   Review of Systems:  Constitutional: denies fever, chills  HEENT: denies cough or congestion. + sore throat  Respiratory: denies any shortness of breath  Cardiovascular: denies chest pain or palpitations  Gastrointestinal: denies abdominal pain, N/V, or diarrhea/and bowel function as per interval history Genitourinary: denies burning with urination or urinary frequency  Vital signs in last 24 hours: [min-max] current  Temp:  [98.5 F (36.9 C)-99.5 F (37.5 C)] 99.2 F (37.3 C) (05/11 0838) Pulse Rate:  [85-101] 88 (05/11 0838) Resp:  [16-18] 18 (05/11 0838) BP: (134-154)/(71-84) 154/84 (05/11 0838) SpO2:  [94 %-99 %] 96 % (05/11 0838)     Height: 5\' 2"  (157.5 cm) Weight: 73.7 kg BMI (Calculated): 29.71   Intake/Output last 2 shifts:  05/10 0701 - 05/11 0700 In: 2014.9 [I.V.:2014.9] Out: 700 [Emesis/NG output:700]   Physical Exam:  Constitutional: alert, cooperative and no distress  HENT: normocephalic without obvious abnormality, NGT in place Eyes: PERRL, EOM's grossly intact and symmetric  Respiratory: breathing non-labored at rest  Cardiovascular: regular rate and sinus rhythm  Gastrointestinal: soft, non-tender, and non-distended, no rebound/guarding  Musculoskeletal: no edema or wounds, motor and sensation grossly intact, NT    Labs:  CBC Latest Ref Rng & Units 10/02/2020 10/01/2020 10/01/2020  WBC 4.0 - 10.5 K/uL 8.6 15.3(H)  13.4(H)  Hemoglobin 12.0 - 15.0 g/dL 10.2(L) 11.5(L) 11.8(L)  Hematocrit 36.0 - 46.0 % 32.3(L) 35.9(L) 37.3  Platelets 150 - 400 K/uL 204 221 265   CMP Latest Ref Rng & Units 10/02/2020 10/01/2020 10/01/2020  Glucose 70 - 99 mg/dL 91 151(H) 160(H)  BUN 8 - 23 mg/dL 31(H) 34(H) 32(H)  Creatinine 0.44 - 1.00 mg/dL 1.36(H) 1.48(H) 1.76(H)  Sodium 135 - 145 mmol/L 138 136 136  Potassium 3.5 - 5.1 mmol/L 4.1 4.6 4.4  Chloride 98 - 111 mmol/L 110 106 103  CO2 22 - 32 mmol/L 21(L) 21(L) 22  Calcium 8.9 - 10.3 mg/dL 7.9(L) 8.7(L) 9.2  Total Protein 6.5 - 8.1 g/dL - - 7.4  Total Bilirubin 0.3 - 1.2 mg/dL - - 0.9  Alkaline Phos 38 - 126 U/L - - 55  AST 15 - 41 U/L - - 20  ALT 0 - 44 U/L - - 14     Imaging studies: No new pertinent imaging studies   Assessment/Plan: (ICD-10's: K61.609) 75 y.o. female with what appears to be SBO vs pSBO likely secondary to post-surgical adhesions given her extensive surgical history               - Appreciate medicine admission.               - Agree with continued NGT decompression; LIS: monitor and record output  - I well get KUB this morning             - Monitor abdominal examination; on-going bowel function             - No emergent surgical intervention. She understands that  if she should fail to resolve with conservative measures she would likely need exploration             - Continue NPO + IVF resuscitation             - Pain control prn; antiemetics prn             - Okay to mobilize              - Further management per primary service; we will follow    All of the above findings and recommendations were discussed with the patient, patient's family (husband at bedside), and the medical team, and all of patient's and family's questions were answered to their expressed satisfaction.  -- Edison Simon, PA-C [facility] 10/02/2020, 9:51 AM [phone] M-F: 7am - 4pm  KUB personally reviewed.  Normal bowel gas pattern without  dilated loops or air-fluid levels.  Would like better evidence or ROBF prior to clamping tube. Patient up walking at the time of my documentation. I saw and evaluated the patient.  I agree with the above documentation, exam, and plan, which I have edited where appropriate. Fredirick Maudlin  10:57 AM

## 2020-10-03 DIAGNOSIS — K56609 Unspecified intestinal obstruction, unspecified as to partial versus complete obstruction: Secondary | ICD-10-CM | POA: Diagnosis not present

## 2020-10-03 LAB — BASIC METABOLIC PANEL
Anion gap: 9 (ref 5–15)
BUN: 27 mg/dL — ABNORMAL HIGH (ref 8–23)
CO2: 21 mmol/L — ABNORMAL LOW (ref 22–32)
Calcium: 8.1 mg/dL — ABNORMAL LOW (ref 8.9–10.3)
Chloride: 108 mmol/L (ref 98–111)
Creatinine, Ser: 1.3 mg/dL — ABNORMAL HIGH (ref 0.44–1.00)
GFR, Estimated: 44 mL/min — ABNORMAL LOW (ref >60)
Glucose, Bld: 80 mg/dL (ref 70–99)
Potassium: 3.9 mmol/L (ref 3.5–5.1)
Sodium: 138 mmol/L (ref 135–145)

## 2020-10-03 LAB — CBC
HCT: 32.4 % — ABNORMAL LOW (ref 36.0–46.0)
Hemoglobin: 10.1 g/dL — ABNORMAL LOW (ref 12.0–15.0)
MCH: 28 pg (ref 26.0–34.0)
MCHC: 31.2 g/dL (ref 30.0–36.0)
MCV: 89.8 fL (ref 80.0–100.0)
Platelets: 177 10*3/uL (ref 150–400)
RBC: 3.61 MIL/uL — ABNORMAL LOW (ref 3.87–5.11)
RDW: 13 % (ref 11.5–15.5)
WBC: 8.3 10*3/uL (ref 4.0–10.5)
nRBC: 0 % (ref 0.0–0.2)

## 2020-10-03 LAB — MAGNESIUM: Magnesium: 2.1 mg/dL (ref 1.7–2.4)

## 2020-10-03 MED ORDER — METOPROLOL SUCCINATE ER 50 MG PO TB24
50.0000 mg | ORAL_TABLET | Freq: Every day | ORAL | Status: DC
  Administered 2020-10-04: 50 mg via ORAL
  Filled 2020-10-03: qty 1

## 2020-10-03 NOTE — Progress Notes (Signed)
Mobility Specialist - Progress Note   10/03/20 1400  Mobility  Activity Ambulated in hall  Level of Assistance Modified independent, requires aide device or extra time  Assistive Device None ([place])  Distance Ambulated (ft) 320 ft  Mobility Ambulated independently in hallway  Mobility Response Tolerated well  Mobility performed by Mobility specialist  $Mobility charge 1 Mobility    Pre-mobility: 91 HR, 92% SpO2 During mobility: 117 HR, 96% SpO2 Post-mobility: 102 HR, 95% SpO2   Pt ambulated in hallway pushing IV pole. No LOB. Denied dizziness and pain. Denied SOB on RA.    Kathee Delton Mobility Specialist 10/03/20, 2:46 PM

## 2020-10-03 NOTE — Progress Notes (Signed)
PROGRESS NOTE    Debbie Delacruz  JQB:[id number] DOB: [birth year] DOA: 10/01/2020 PCP: [name], MD   Brief Narrative: Taken from H&P. Debbie Delacruz is a 75 y.o. female with medical history significant for coronary artery disease, atrial fibrillation, depression, GERD, hypertension, dyslipidemia and sleep apnea, who presented to the emergency room with acute onset of lower abdominal abdominal and left flank pain as well as recurrent nausea and vomiting.  CT abdomen concerning for small bowel obstruction with transition zone in the lower abdomenAt the ileal ileal anastomosis.  Patient has an history of multiple abdominal surgeries including right nephrectomy.  NG tube was placed with improvement in her symptoms. General surgery was consulted and they will try conservative management at this time and if failed she will go to the OR for exploratory laparotomy. Repeat KUB today with mild improvement, continue to have air-fluid levels with nondilated bowels.  Subjective: No abdominal pain.  Pt reported 2 normal BM's.  NG tube removed by surgery.   Assessment & Plan:   Active Problems:   SBO (small bowel obstruction) (HCC)  Small bowel obstruction.   Most likely secondary to adhesions with history of multiple abdominal surgeries.  Surgery would like to try conservative management first. Plan: --NG tube removed today by surgery --clear liquid diet, advance per surgery --reduce MIVF to 50 ml/hr  AKI with CKD stage IIIa.   Cr 1.76 on presentation.  Baseline creatinine around 1 according to the labs done in October and November 2021.  Most likely secondary to dehydration with GI losses, creatinine improving with IV fluid. --reduce MIVF to 50 ml/hr  Essential hypertension.   Blood pressure mildly elevated. -Continue with Norvasc, Imdur and Toprol. --hold Zestril due to AKI  Coronary artery disease.   No chest pain. --resume home Toprol --cont  statin  Dyslipidemia. --cont statin   Objective: Vitals:   10/03/20 0343 10/03/20 0847 10/03/20 1122 10/03/20 1158  BP: (!) 154/77 (!) 163/88 (!) 141/79 (!) 162/69  Pulse: 89 92 86 86  Resp: 16 18 17 18   Temp: 98.6 F (37 C) 98.7 F (37.1 C) 99.1 F (37.3 C) 99.3 F (37.4 C)  TempSrc: Oral Oral Oral Oral  SpO2: 97% 98% 96% 98%  Weight:      Height:        Intake/Output Summary (Last 24 hours) at 10/03/2020 1544 Last data filed at 10/03/2020 1446 Gross per 24 hour  Intake 3233.26 ml  Output 1701 ml  Net 1532.26 ml   Filed Weights   10/01/20 0120 10/01/20 0546  Weight: 72.6 kg 73.7 kg    Examination:  Constitutional: NAD, AAOx3 HEENT: conjunctivae and lids normal, EOMI CV: No cyanosis.   RESP: normal respiratory effort, on RA GI: abdomen soft, NT Extremities: No effusions, edema in BLE SKIN: warm, dry Neuro: II - XII grossly intact.   Psych: Normal mood and affect.  Appropriate judgement and reason   DVT prophylaxis: Lovenox Code Status: Full Family Communication: Husband was updated at bedside today Disposition Plan:  Status is: Inpatient  Remains inpatient appropriate because:Inpatient level of care appropriate due to severity of illness   Dispo: The patient is from: Home              Anticipated d/c is to: Home              Patient currently is not medically stable to d/c.  Need to advance diet.    Difficult to place patient No  Level of care: Med-Surg    All the records are reviewed and case discussed with Care Management/Social Worker. Management plans discussed with the patient, nursing and they are in agreement.  Consultants:   General surgery  Procedures:  Antimicrobials:   Data Reviewed: I have personally reviewed following labs and imaging studies  CBC: Recent Labs  Lab 10/01/20 0119 10/01/20 0515 10/02/20 0331 10/03/20 0316  WBC 13.4* 15.3* 8.6 8.3  NEUTROABS 11.2*  --   --   --   HGB 11.8* 11.5* 10.2* 10.1*   HCT 37.3 35.9* 32.3* 32.4*  MCV 89.0 88.6 89.5 89.8  PLT 265 221 204 950   Basic Metabolic Panel: Recent Labs  Lab 10/01/20 0119 10/01/20 0515 10/02/20 0331 10/03/20 0316  NA 136 136 138 138  K 4.4 4.6 4.1 3.9  CL 103 106 110 108  CO2 22 21* 21* 21*  GLUCOSE 160* 151* 91 80  BUN 32* 34* 31* 27*  CREATININE 1.76* 1.48* 1.36* 1.30*  CALCIUM 9.2 8.7* 7.9* 8.1*  MG  --  2.2 2.0 2.1   GFR: Estimated Creatinine Clearance: 36.7 mL/min (A) (by C-G formula based on SCr of 1.3 mg/dL (H)). Liver Function Tests: Recent Labs  Lab 10/01/20 0119  AST 20  ALT 14  ALKPHOS 55  BILITOT 0.9  PROT 7.4  ALBUMIN 4.2   Recent Labs  Lab 10/01/20 0119  LIPASE 34   No results for input(s): AMMONIA in the last 168 hours. Coagulation Profile: No results for input(s): INR, PROTIME in the last 168 hours. Cardiac Enzymes: No results for input(s): CKTOTAL, CKMB, CKMBINDEX, TROPONINI in the last 168 hours. BNP (last 3 results) No results for input(s): PROBNP in the last 8760 hours. HbA1C: No results for input(s): HGBA1C in the last 72 hours. CBG: No results for input(s): GLUCAP in the last 168 hours. Lipid Profile: No results for input(s): CHOL, HDL, LDLCALC, TRIG, CHOLHDL, LDLDIRECT in the last 72 hours. Thyroid Function Tests: No results for input(s): TSH, T4TOTAL, FREET4, T3FREE, THYROIDAB in the last 72 hours. Anemia Panel: No results for input(s): VITAMINB12, FOLATE, FERRITIN, TIBC, IRON, RETICCTPCT in the last 72 hours. Sepsis Labs: Recent Labs  Lab 10/01/20 0146  LATICACIDVEN 1.8    Recent Results (from the past 240 hour(s))  Resp Panel by RT-PCR (Flu A&B, Covid) Nasopharyngeal Swab     Status: None   Collection Time: 10/01/20  4:22 AM   Specimen: Nasopharyngeal Swab; Nasopharyngeal(NP) swabs in vial transport medium  Result Value Ref Range Status   SARS Coronavirus 2 by RT PCR NEGATIVE NEGATIVE Final    Comment: (NOTE) SARS-CoV-2 target nucleic acids are NOT  DETECTED.  The SARS-CoV-2 RNA is generally detectable in upper respiratory specimens during the acute phase of infection. The lowest concentration of SARS-CoV-2 viral copies this assay can detect is 138 copies/mL. A negative result does not preclude SARS-Cov-2 infection and should not be used as the sole basis for treatment or other patient management decisions. A negative result may occur with  improper specimen collection/handling, submission of specimen other than nasopharyngeal swab, presence of viral mutation(s) within the areas targeted by this assay, and inadequate number of viral copies(<138 copies/mL). A negative result must be combined with clinical observations, patient history, and epidemiological information. The expected result is Negative.  Fact Sheet for Patients:  EntrepreneurPulse.com.au  Fact Sheet for Healthcare Providers:  IncredibleEmployment.be  This test is no t yet approved or cleared by the [place] FDA and  has been authorized for detection  and/or diagnosis of SARS-CoV-2 by FDA under an Emergency Use Authorization (EUA). This EUA will remain  in effect (meaning this test can be used) for the duration of the COVID-19 declaration under Section 564(b)(1) of the Act, 21 U.S.C.section 360bbb-3(b)(1), unless the authorization is terminated  or revoked sooner.       Influenza A by PCR NEGATIVE NEGATIVE Final   Influenza B by PCR NEGATIVE NEGATIVE Final    Comment: (NOTE) The Xpert Xpress SARS-CoV-2/FLU/RSV plus assay is intended as an aid in the diagnosis of influenza from Nasopharyngeal swab specimens and should not be used as a sole basis for treatment. Nasal washings and aspirates are unacceptable for Xpert Xpress SARS-CoV-2/FLU/RSV testing.  Fact Sheet for Patients: EntrepreneurPulse.com.au  Fact Sheet for Healthcare Providers: IncredibleEmployment.be  This test is not yet  approved or cleared by the [place] FDA and has been authorized for detection and/or diagnosis of SARS-CoV-2 by FDA under an Emergency Use Authorization (EUA). This EUA will remain in effect (meaning this test can be used) for the duration of the COVID-19 declaration under Section 564(b)(1) of the Act, 21 U.S.C. section 360bbb-3(b)(1), unless the authorization is terminated or revoked.  Performed at [facility], [address] [place],  [address]      Radiology Studies: DG Abd 2 Views  Result Date: 10/02/2020 CLINICAL DATA:  Small-bowel obstruction. EXAM: ABDOMEN - 2 VIEW COMPARISON:  Radiographs and CT 10/01/2020. FINDINGS: Nasogastric tube terminates at the level of the mid stomach. The degree of small-bowel distention appears mildly improved, although there are persistent scattered air-fluid levels on the erect examination. The colon appears decompressed with moderate stool distally. No evidence of bowel wall thickening, pneumatosis or free air. Bilateral pelvic calcifications are stable. Cholecystectomy clips and prior median sternotomy are noted. IMPRESSION: Slight improvement in the degree of small bowel distension with persistent scattered air-fluid levels consistent with improving small bowel obstruction. Enteric tube projects over the mid stomach. Electronically Signed   By: Richardean Sale M.D.   On: 10/02/2020 13:33    Scheduled Meds: . amLODipine  5 mg Oral Daily  . Benzocaine  1 application Mouth/Throat Once  . enoxaparin (LOVENOX) injection  40 mg Subcutaneous Q24H  . ezetimibe  10 mg Oral Daily  . isosorbide mononitrate  30 mg Oral Daily  . metoprolol tartrate  2.5 mg Intravenous Q6H  . pantoprazole (PROTONIX) IV  40 mg Intravenous Q24H  . rosuvastatin  40 mg Oral Daily   Continuous Infusions: . sodium chloride 100 mL/hr at 10/03/20 0854     LOS: 2 days   Enzo Bi, MD [facility]  If 7PM-7AM, please contact  night-coverage [email or web address]  10/03/2020, 3:44 PM

## 2020-10-03 NOTE — Plan of Care (Signed)

## 2020-10-03 NOTE — Progress Notes (Signed)
[facility] SURGICAL PROGRESS NOTE (cpt [phone])  Hospital Day(s): 2.   Interval History: Patient seen and examined, no acute events or new complaints overnight. She has had 2 bowel movements and want her NGT out  Review of Systems:  Constitutional: denies fever, chills  HEENT: denies cough or congestion. + sore throat  Respiratory: denies any shortness of breath  Cardiovascular: denies chest pain or palpitations  Gastrointestinal: denies abdominal pain, N/V, or diarrhea/and bowel function as per interval history Genitourinary: denies burning with urination or urinary frequency  Vital signs in last 24 hours: [min-max] current  Temp:  [98.6 F (37 C)-99.4 F (37.4 C)] 98.7 F (37.1 C) (05/12 0847) Pulse Rate:  [85-96] 92 (05/12 0847) Resp:  [16-18] 18 (05/12 0847) BP: (146-163)/(76-96) 163/88 (05/12 0847) SpO2:  [96 %-98 %] 98 % (05/12 0847)     Height: 5\' 2"  (157.5 cm) Weight: 73.7 kg BMI (Calculated): 29.71   Intake/Output last 2 shifts:  05/11 0701 - 05/12 0700 In: 1720.5 [I.V.:1720.5] Out: 1400 [Emesis/NG output:1400]   Physical Exam:  Constitutional: alert, cooperative and no distress  HENT: normocephalic without obvious abnormality, NGT in place Eyes: PERRL, EOM's grossly intact and symmetric  Respiratory: breathing non-labored at rest  Cardiovascular: regular rate and sinus rhythm  Gastrointestinal: soft, non-tender, and non-distended, no rebound/guarding  Musculoskeletal: no edema or wounds, motor and sensation grossly intact, NT    Labs:  CBC Latest Ref Rng & Units 10/03/2020 10/02/2020 10/01/2020  WBC 4.0 - 10.5 K/uL 8.3 8.6 15.3(H)  Hemoglobin 12.0 - 15.0 g/dL 10.1(L) 10.2(L) 11.5(L)  Hematocrit 36.0 - 46.0 % 32.4(L) 32.3(L) 35.9(L)  Platelets 150 - 400 K/uL 177 204 221   CMP Latest Ref Rng & Units 10/03/2020 10/02/2020 10/01/2020  Glucose 70 - 99 mg/dL 80 91 151(H)  BUN 8 - 23 mg/dL 27(H) 31(H) 34(H)  Creatinine 0.44 - 1.00 mg/dL 1.30(H) 1.36(H)  1.48(H)  Sodium 135 - 145 mmol/L 138 138 136  Potassium 3.5 - 5.1 mmol/L 3.9 4.1 4.6  Chloride 98 - 111 mmol/L 108 110 106  CO2 22 - 32 mmol/L 21(L) 21(L) 21(L)  Calcium 8.9 - 10.3 mg/dL 8.1(L) 7.9(L) 8.7(L)  Total Protein 6.5 - 8.1 g/dL - - -  Total Bilirubin 0.3 - 1.2 mg/dL - - -  Alkaline Phos 38 - 126 U/L - - -  AST 15 - 41 U/L - - -  ALT 0 - 44 U/L - - -     Imaging studies: No new pertinent imaging studies   Assessment/Plan: (ICD-10's: K51.609) 75 y.o. female with what appears to be SBO vs pSBO likely secondary to post-surgical adhesions given her extensive surgical history. Now having bowel function.               - NGT removed  - Start CLD, advance as tolerated  - Do not anticipate need for surgical intervention this admission.  - Likely ready for d/c tomorrow or Saturday, pending tolerance of diet.

## 2020-10-04 ENCOUNTER — Telehealth: Payer: Self-pay | Admitting: Family Medicine

## 2020-10-04 DIAGNOSIS — K56609 Unspecified intestinal obstruction, unspecified as to partial versus complete obstruction: Secondary | ICD-10-CM | POA: Diagnosis not present

## 2020-10-04 DIAGNOSIS — Z9289 Personal history of other medical treatment: Secondary | ICD-10-CM | POA: Diagnosis not present

## 2020-10-04 LAB — BASIC METABOLIC PANEL
Anion gap: 5 (ref 5–15)
BUN: 22 mg/dL (ref 8–23)
CO2: 23 mmol/L (ref 22–32)
Calcium: 7.9 mg/dL — ABNORMAL LOW (ref 8.9–10.3)
Chloride: 110 mmol/L (ref 98–111)
Creatinine, Ser: 1.15 mg/dL — ABNORMAL HIGH (ref 0.44–1.00)
GFR, Estimated: 51 mL/min — ABNORMAL LOW (ref >60)
Glucose, Bld: 133 mg/dL — ABNORMAL HIGH (ref 70–99)
Potassium: 3.6 mmol/L (ref 3.5–5.1)
Sodium: 138 mmol/L (ref 135–145)

## 2020-10-04 LAB — CBC
HCT: 28.3 % — ABNORMAL LOW (ref 36.0–46.0)
Hemoglobin: 9 g/dL — ABNORMAL LOW (ref 12.0–15.0)
MCH: 28.3 pg (ref 26.0–34.0)
MCHC: 31.8 g/dL (ref 30.0–36.0)
MCV: 89 fL (ref 80.0–100.0)
Platelets: 171 10*3/uL (ref 150–400)
RBC: 3.18 MIL/uL — ABNORMAL LOW (ref 3.87–5.11)
RDW: 13 % (ref 11.5–15.5)
WBC: 6.2 10*3/uL (ref 4.0–10.5)
nRBC: 0 % (ref 0.0–0.2)

## 2020-10-04 LAB — MAGNESIUM: Magnesium: 1.8 mg/dL (ref 1.7–2.4)

## 2020-10-04 NOTE — Plan of Care (Signed)
Pt Axox4. Calm and cooperative and able to voice her needs. Pt tolerating full diet. No complaints of pain, no nausea, nor vomiting. Pt with + flatus and reports she has had 3 Bms during the day yesterday. Safety measures in place. Will continue to monitor.  Problem: Education: Goal: Knowledge of General Education information will improve Description: Including pain rating scale, medication(s)/side effects and non-pharmacologic comfort measures Outcome: Progressing   Problem: Health Behavior/Discharge Planning: Goal: Ability to manage health-related needs will improve Outcome: Progressing   Problem: Clinical Measurements: Goal: Ability to maintain clinical measurements within normal limits will improve Outcome: Progressing Goal: Will remain free from infection Outcome: Progressing Goal: Diagnostic test results will improve Outcome: Progressing Goal: Respiratory complications will improve Outcome: Progressing Goal: Cardiovascular complication will be avoided Outcome: Progressing   Problem: Activity: Goal: Risk for activity intolerance will decrease Outcome: Progressing   Problem: Nutrition: Goal: Adequate nutrition will be maintained Outcome: Progressing   Problem: Coping: Goal: Level of anxiety will decrease Outcome: Progressing   Problem: Elimination: Goal: Will not experience complications related to bowel motility Outcome: Progressing Goal: Will not experience complications related to urinary retention Outcome: Progressing   Problem: Pain Managment: Goal: General experience of comfort will improve Outcome: Progressing   Problem: Safety: Goal: Ability to remain free from injury will improve Outcome: Progressing   Problem: Skin Integrity: Goal: Risk for impaired skin integrity will decrease Outcome: Progressing

## 2020-10-04 NOTE — Progress Notes (Signed)
Debbie Delacruz to be D/C'd Home per MD order.  Discussed prescriptions and follow up appointments with the patient. Prescriptions given to patient, medication list explained in detail. Pt verbalized understanding.  Allergies as of 10/04/2020      Reactions   Ferrous Sulfate [ferrous Fumarate] Other (See Comments)   STOMACH ISSUES   Lipitor [atorvastatin] Other (See Comments)   myalgia      Medication List    TAKE these medications   acetaminophen 500 MG tablet Commonly known as: TYLENOL Take 500 mg by mouth every 6 (six) hours as needed for headache.   amLODipine 5 MG tablet Commonly known as: NORVASC TAKE 1 TABLET BY MOUTH ONCE DAILY   aspirin EC 81 MG tablet Take 1 tablet (81 mg total) by mouth daily.   ezetimibe 10 MG tablet Commonly known as: Zetia Take 1 tablet (10 mg total) by mouth daily.   isosorbide mononitrate 30 MG 24 hr tablet Commonly known as: IMDUR TAKE 1 TABLET BY MOUTH ONCE DAILY   lisinopril 10 MG tablet Commonly known as: ZESTRIL Take 1 tablet (10 mg total) by mouth daily.   metoprolol succinate 50 MG 24 hr tablet Commonly known as: TOPROL-XL TAKE 1 TABLET BY MOUTH ONCE DAILY   nitroGLYCERIN 0.4 MG SL tablet Commonly known as: NITROSTAT Place 1 tablet (0.4 mg total) under the tongue every 5 (five) minutes as needed for chest pain. Take for up to 3 doses and then call 911 if still having chest pain.   ondansetron 4 MG tablet Commonly known as: Zofran Take 1 tablet (4 mg total) by mouth every 8 (eight) hours as needed for up to 10 doses for nausea or vomiting.   rosuvastatin 40 MG tablet Commonly known as: CRESTOR TAKE 1 TABLET BY MOUTH ONCE DAILY       Vitals:   10/04/20 0505 10/04/20 0737  BP: 134/71 (!) 141/72  Pulse: 75 70  Resp: 18 17  Temp: 98.8 F (37.1 C) 99 F (37.2 C)  SpO2: 97% 99%    Skin clean, dry and intact without evidence of skin break down, no evidence of skin tears noted. IV catheter discontinued intact. Site  without signs and symptoms of complications. Dressing and pressure applied. Pt denies pain at this time. No complaints noted.  An After Visit Summary was printed and given to the patient. Patient escorted via [place], and D/C home via private auto.  Rolley Sims

## 2020-10-04 NOTE — Care Management Important Message (Signed)
Important Message  Patient Details  Name: Debbie Delacruz MRN: [id number] Date of Birth: Aug 06, 1945   Medicare Important Message Given:  Yes     Dannette Barbara 10/04/2020, 10:39 AM

## 2020-10-04 NOTE — Progress Notes (Addendum)
[facility] SURGICAL PROGRESS NOTE (cpt [phone])  Hospital Day(s): 3.   Interval History: Patient seen and examined, no acute events or new complaints overnight. Patient reports she continues to feel better, denies fever, chills, nausea, emesis, or abdominal pain. Labs remain reassuring and her renal function seems closer to baseline. NGT removed yesterday (05/12) and diet initiated. She has been advanced to full liquids and tolerating well. She continues to pass flatus and have BMs.   Review of Systems:  Constitutional: denies fever, chills  HEENT: denies cough or congestion  Respiratory: denies any shortness of breath  Cardiovascular: denies chest pain or palpitations  Gastrointestinal: denies abdominal pain, N/V, or diarrhea/and bowel function as per interval history Genitourinary: denies burning with urination or urinary frequency  Vital signs in last 24 hours: [min-max] current  Temp:  [98.2 F (36.8 C)-99.3 F (37.4 C)] 98.8 F (37.1 C) (05/13 0505) Pulse Rate:  [75-92] 75 (05/13 0505) Resp:  [16-18] 18 (05/13 0505) BP: (125-163)/(66-88) 134/71 (05/13 0505) SpO2:  [96 %-98 %] 97 % (05/13 0505)     Height: 5\' 2"  (157.5 cm) Weight: 73.7 kg BMI (Calculated): 29.71   Intake/Output last 2 shifts:  05/12 0701 - 05/13 0700 In: 2532.8 [P.O.:1800; I.V.:732.8] Out: 951 [Urine:650; Emesis/NG output:300; Stool:1]   Physical Exam:  Constitutional: alert, cooperative and no distress  HENT: normocephalic without obvious abnormality  Eyes: PERRL, EOM's grossly intact and symmetric  Respiratory: breathing non-labored at rest  Cardiovascular: regular rate and sinus rhythm  Gastrointestinal: soft, non-tender, and non-distended, no rebound/guarding Musculoskeletal: no edema or wounds, motor and sensation grossly intact, NT    Labs:  CBC Latest Ref Rng & Units 10/04/2020 10/03/2020 10/02/2020  WBC 4.0 - 10.5 K/uL 6.2 8.3 8.6  Hemoglobin 12.0 - 15.0 g/dL 9.0(L) 10.1(L) 10.2(L)   Hematocrit 36.0 - 46.0 % 28.3(L) 32.4(L) 32.3(L)  Platelets 150 - 400 K/uL 171 177 204   CMP Latest Ref Rng & Units 10/04/2020 10/03/2020 10/02/2020  Glucose 70 - 99 mg/dL 133(H) 80 91  BUN 8 - 23 mg/dL 22 27(H) 31(H)  Creatinine 0.44 - 1.00 mg/dL 1.15(H) 1.30(H) 1.36(H)  Sodium 135 - 145 mmol/L 138 138 138  Potassium 3.5 - 5.1 mmol/L 3.6 3.9 4.1  Chloride 98 - 111 mmol/L 110 108 110  CO2 22 - 32 mmol/L 23 21(L) 21(L)  Calcium 8.9 - 10.3 mg/dL 7.9(L) 8.1(L) 7.9(L)  Total Protein 6.5 - 8.1 g/dL - - -  Total Bilirubin 0.3 - 1.2 mg/dL - - -  Alkaline Phos 38 - 126 U/L - - -  AST 15 - 41 U/L - - -  ALT 0 - 44 U/L - - -     Imaging studies: No new pertinent imaging studies   Assessment/Plan: (ICD-10's: K4.609) 75 y.o. female with clinically resolved SBO vs pSBO likely secondary to post-surgical adhesions given her extensive surgical history. Now having bowel function.   - Advance to soft/regular diet this morning   - Monitor abdominal examination; on-going bowel function             - No surgical intervention             - Pain control prn; antiemetics prn             - Okay to mobilize as toelrated              - Further management per primary service   - Discharge Planning: If tolerates advancement of diet this morning, she is  stable for discharge from surgical perspective. Nothing further to add from surgical perspective. She DOES NOT need surgical follow and can follow up with her PCP.    All of the above findings and recommendations were discussed with the patient, and the medical team, and all of patient's questions were answered to her expressed satisfaction.  -- Edison Simon, PA-C [facility] 10/04/2020, 7:10 AM [phone] M-F: 7am - 4pm  I saw and evaluated the patient.  I agree with the above documentation, exam, and plan, which I have edited where appropriate. Fredirick Maudlin  10:23 AM

## 2020-10-04 NOTE — Plan of Care (Signed)
  Problem: Education: Goal: Knowledge of General Education information will improve Description: Including pain rating scale, medication(s)/side effects and non-pharmacologic comfort measures 10/04/2020 1120 by Orvan Seen, RN Outcome: Completed/Met 10/04/2020 1120 by Orvan Seen, RN Outcome: Progressing   Problem: Health Behavior/Discharge Planning: Goal: Ability to manage health-related needs will improve 10/04/2020 1120 by Orvan Seen, RN Outcome: Completed/Met 10/04/2020 1120 by Orvan Seen, RN Outcome: Progressing   Problem: Clinical Measurements: Goal: Ability to maintain clinical measurements within normal limits will improve 10/04/2020 1120 by Orvan Seen, RN Outcome: Completed/Met 10/04/2020 1120 by Orvan Seen, RN Outcome: Progressing Goal: Will remain free from infection 10/04/2020 1120 by Orvan Seen, RN Outcome: Completed/Met 10/04/2020 1120 by Orvan Seen, RN Outcome: Progressing Goal: Diagnostic test results will improve 10/04/2020 1120 by Orvan Seen, RN Outcome: Completed/Met 10/04/2020 1120 by Orvan Seen, RN Outcome: Progressing Goal: Respiratory complications will improve 10/04/2020 1120 by Orvan Seen, RN Outcome: Completed/Met 10/04/2020 1120 by Orvan Seen, RN Outcome: Progressing Goal: Cardiovascular complication will be avoided 10/04/2020 1120 by Orvan Seen, RN Outcome: Completed/Met 10/04/2020 1120 by Orvan Seen, RN Outcome: Progressing   Problem: Activity: Goal: Risk for activity intolerance will decrease 10/04/2020 1120 by Orvan Seen, RN Outcome: Completed/Met 10/04/2020 1120 by Orvan Seen, RN Outcome: Progressing   Problem: Nutrition: Goal: Adequate nutrition will be maintained 10/04/2020 1120 by Orvan Seen, RN Outcome: Completed/Met 10/04/2020 1120 by Orvan Seen, RN Outcome: Progressing   Problem: Coping: Goal: Level of anxiety will decrease 10/04/2020  1120 by Orvan Seen, RN Outcome: Completed/Met 10/04/2020 1120 by Orvan Seen, RN Outcome: Progressing   Problem: Elimination: Goal: Will not experience complications related to bowel motility 10/04/2020 1120 by Orvan Seen, RN Outcome: Completed/Met 10/04/2020 1120 by Orvan Seen, RN Outcome: Progressing Goal: Will not experience complications related to urinary retention 10/04/2020 1120 by Orvan Seen, RN Outcome: Completed/Met 10/04/2020 1120 by Orvan Seen, RN Outcome: Progressing   Problem: Pain Managment: Goal: General experience of comfort will improve 10/04/2020 1120 by Orvan Seen, RN Outcome: Completed/Met 10/04/2020 1120 by Orvan Seen, RN Outcome: Progressing   Problem: Safety: Goal: Ability to remain free from injury will improve 10/04/2020 1120 by Orvan Seen, RN Outcome: Completed/Met 10/04/2020 1120 by Orvan Seen, RN Outcome: Progressing   Problem: Skin Integrity: Goal: Risk for impaired skin integrity will decrease 10/04/2020 1120 by Orvan Seen, RN Outcome: Completed/Met 10/04/2020 1120 by Orvan Seen, RN Outcome: Progressing

## 2020-10-04 NOTE — Discharge Summary (Signed)
Physician Discharge Summary   Debbie Delacruz  female DOB: [birth year]  [id number]  PCP: [name], MD  Admit date: 10/01/2020 Discharge date: 10/04/2020  Admitted From: home Disposition:  home CODE STATUS: Full code   Hospital Course:  For full details, please see H&P, progress notes, consult notes and ancillary notes.  Briefly,  Debbie Neujahr Robertsonis a 75 y.o.femalewith medical history significant forcoronary artery disease, atrial fibrillation, depression, GERD, hypertension, dyslipidemia and sleep apnea, who presented to the emergency room withacute onsetof lower abdominalabdominalandleft flank pain as well as recurrent nausea and vomiting.  CT abdomen concerning for small bowel obstruction with transition zone in the lower abdomen at the ileal ileal anastomosis.  Patient has an history of multiple abdominal surgeries including right nephrectomy.    Small bowel obstruction.   General surgery was consulted.  NG tube was placed with improvement in her symptoms.  After NG tube removed by surgery, pt was able to advance diet without issues, and was tolerating soft diet prior to discharge.    AKI with CKD stage IIIa.   Cr 1.76 on presentation.  Baseline creatinine around 1 according to the labs done in October and November 2021.  Most likely secondary to dehydration with GI losses, creatinine improving with IV fluid.  Cr 1.15 on the day of discharge.  Essential hypertension.   Blood pressure mildly elevated. -Continued with Norvasc, Imdur and Toprol. --home Zestril held during hospitalization due to AKI, but resumed after discharge.  Coronary artery disease.   No chest pain. --continued home Toprol --cont statin  Dyslipidemia. --cont statin   Discharge Diagnoses:  Active Problems:   SBO (small bowel obstruction) ([place])   30 Day Unplanned Readmission Risk Score   Flowsheet Row ED to Hosp-Admission (Current) from 10/01/2020 in [place]  30 Day Unplanned Readmission Risk Score (%) 15.27 Filed at 10/04/2020 0801     This score is the patient's risk of an unplanned readmission within 30 days of being discharged (0 -100%). The score is based on dignosis, age, lab data, medications, orders, and past utilization.   Low:  0-14.9   Medium: 15-21.9   High: 22-29.9   Extreme: 30 and above        Discharge Instructions:  Allergies as of 10/04/2020      Reactions   Ferrous Sulfate [ferrous Fumarate] Other (See Comments)   STOMACH ISSUES   Lipitor [atorvastatin] Other (See Comments)   myalgia      Medication List    TAKE these medications   acetaminophen 500 MG tablet Commonly known as: TYLENOL Take 500 mg by mouth every 6 (six) hours as needed for headache.   amLODipine 5 MG tablet Commonly known as: NORVASC TAKE 1 TABLET BY MOUTH ONCE DAILY   aspirin EC 81 MG tablet Take 1 tablet (81 mg total) by mouth daily.   ezetimibe 10 MG tablet Commonly known as: Zetia Take 1 tablet (10 mg total) by mouth daily.   isosorbide mononitrate 30 MG 24 hr tablet Commonly known as: IMDUR TAKE 1 TABLET BY MOUTH ONCE DAILY   lisinopril 10 MG tablet Commonly known as: ZESTRIL Take 1 tablet (10 mg total) by mouth daily.   metoprolol succinate 50 MG 24 hr tablet Commonly known as: TOPROL-XL TAKE 1 TABLET BY MOUTH ONCE DAILY   nitroGLYCERIN 0.4 MG SL tablet Commonly known as: NITROSTAT Place 1 tablet (0.4 mg total) under the tongue every 5 (five) minutes as needed for chest pain.  Take for up to 3 doses and then call 911 if still having chest pain.   ondansetron 4 MG tablet Commonly known as: Zofran Take 1 tablet (4 mg total) by mouth every 8 (eight) hours as needed for up to 10 doses for nausea or vomiting.   rosuvastatin 40 MG tablet Commonly known as: CRESTOR TAKE 1 TABLET BY MOUTH ONCE DAILY        Follow-up Information    [name], MD. Schedule an appointment as  soon as possible for a visit in 1 week(s).   Specialty: Family Medicine Contact information: [address] [place] [address] [phone]        [name], MD .   Specialties: Cardiology, Radiology Contact information: [place] [address] [phone]               Allergies  Allergen Reactions  . Ferrous Sulfate [Ferrous Fumarate] Other (See Comments)    STOMACH ISSUES  . Lipitor [Atorvastatin] Other (See Comments)    myalgia     The results of significant diagnostics from this hospitalization (including imaging, microbiology, ancillary and laboratory) are listed below for reference.   Consultations:   Procedures/Studies: CT ABDOMEN PELVIS WO CONTRAST  Result Date: 10/01/2020 CLINICAL DATA:  75 year old female with flank pain. Concern for kidney stone. EXAM: CT ABDOMEN AND PELVIS WITHOUT CONTRAST TECHNIQUE: Multidetector CT imaging of the abdomen and pelvis was performed following the standard protocol without IV contrast. COMPARISON:  CT abdomen pelvis dated 02/19/2020. FINDINGS: Evaluation of this exam is limited in the absence of intravenous contrast. Lower chest: The visualized lung bases are clear. Three vessel coronary vascular calcification and postsurgical changes of CABG. No intra-abdominal free air or free fluid. Hepatobiliary: The liver is unremarkable. No intrahepatic biliary ductal dilatation. Cholecystectomy. No retained calcified stone noted in the central CBD. Pancreas: Unremarkable. No pancreatic ductal dilatation or surrounding inflammatory changes. Spleen: Normal in size without focal abnormality. Adrenals/Urinary Tract: The right adrenal gland is not visualized. The left adrenal gland is unremarkable. Status post prior right nephrectomy. There is no hydronephrosis or nephrolithiasis of the left kidney. The left ureter and urinary bladder appear unremarkable. Stomach/Bowel: There is severe colonic  diverticulosis without active inflammatory changes. There is a small hiatal hernia. There is postsurgical changes of the bowel with anastomotic suture in the lower abdomen. There is dilatation of small bowel loops proximal to the anastomosis measuring up to 3.5 cm. The distal small bowel are collapsed. The appendix is unremarkable as visualized. Vascular/Lymphatic: Advanced aortoiliac atherosclerotic disease. The IVC is unremarkable. No portal venous gas. There is no adenopathy. Reproductive: Hysterectomy.  No adnexal masses. Other: Ventral hernia repair mesh. Musculoskeletal: Osteopenia with degenerative changes of the spine. No acute osseous pathology. IMPRESSION: 1. Small-bowel obstruction with transition zone in the lower abdomen at the ileo ileal anastomosis. 2. Severe colonic diverticulosis. 3. Status post prior right nephrectomy. 4. Aortic Atherosclerosis (ICD10-I70.0). Electronically Signed   By: Anner Crete M.D.   On: 10/01/2020 03:11   DG Abd 2 Views  Result Date: 10/02/2020 CLINICAL DATA:  Small-bowel obstruction. EXAM: ABDOMEN - 2 VIEW COMPARISON:  Radiographs and CT 10/01/2020. FINDINGS: Nasogastric tube terminates at the level of the mid stomach. The degree of small-bowel distention appears mildly improved, although there are persistent scattered air-fluid levels on the erect examination. The colon appears decompressed with moderate stool distally. No evidence of bowel wall thickening, pneumatosis or free air. Bilateral pelvic calcifications are stable. Cholecystectomy clips and  prior median sternotomy are noted. IMPRESSION: Slight improvement in the degree of small bowel distension with persistent scattered air-fluid levels consistent with improving small bowel obstruction. Enteric tube projects over the mid stomach. Electronically Signed   By: Richardean Sale M.D.   On: 10/02/2020 13:33   DG Abd 2 Views  Result Date: 10/01/2020 CLINICAL DATA:  Small bowel obstruction EXAM: ABDOMEN - 2  VIEW COMPARISON:  CT abdomen and pelvis Oct 01, 2020 FINDINGS: Supine and upright images were obtained. Nasogastric tube tip is in the proximal duodenum. There are loops of mildly dilated small bowel without appreciable air-fluid level. No free air evident. Surgical clips in right upper quadrant. Apparent phleboliths in the pelvis. Mild atelectasis left lung base. Lung bases otherwise clear. IMPRESSION: Nasogastric tube tip in proximal duodenum. Loops of mildly dilated bowel without air-fluid levels. There may be resolving bowel obstruction. No free air evident. Mild left base atelectasis. Lung bases otherwise clear. Electronically Signed   By: Lowella Grip III M.D.   On: 10/01/2020 11:39      Labs: BNP (last 3 results) No results for input(s): BNP in the last 8760 hours. Basic Metabolic Panel: Recent Labs  Lab 10/01/20 0119 10/01/20 0515 10/02/20 0331 10/03/20 0316 10/04/20 0441  NA 136 136 138 138 138  K 4.4 4.6 4.1 3.9 3.6  CL 103 106 110 108 110  CO2 22 21* 21* 21* 23  GLUCOSE 160* 151* 91 80 133*  BUN 32* 34* 31* 27* 22  CREATININE 1.76* 1.48* 1.36* 1.30* 1.15*  CALCIUM 9.2 8.7* 7.9* 8.1* 7.9*  MG  --  2.2 2.0 2.1 1.8   Liver Function Tests: Recent Labs  Lab 10/01/20 0119  AST 20  ALT 14  ALKPHOS 55  BILITOT 0.9  PROT 7.4  ALBUMIN 4.2   Recent Labs  Lab 10/01/20 0119  LIPASE 34   No results for input(s): AMMONIA in the last 168 hours. CBC: Recent Labs  Lab 10/01/20 0119 10/01/20 0515 10/02/20 0331 10/03/20 0316 10/04/20 0441  WBC 13.4* 15.3* 8.6 8.3 6.2  NEUTROABS 11.2*  --   --   --   --   HGB 11.8* 11.5* 10.2* 10.1* 9.0*  HCT 37.3 35.9* 32.3* 32.4* 28.3*  MCV 89.0 88.6 89.5 89.8 89.0  PLT 265 221 204 177 171   Cardiac Enzymes: No results for input(s): CKTOTAL, CKMB, CKMBINDEX, TROPONINI in the last 168 hours. BNP: Invalid input(s): POCBNP CBG: No results for input(s): GLUCAP in the last 168 hours. D-Dimer No results for input(s): DDIMER in  the last 72 hours. Hgb A1c No results for input(s): HGBA1C in the last 72 hours. Lipid Profile No results for input(s): CHOL, HDL, LDLCALC, TRIG, CHOLHDL, LDLDIRECT in the last 72 hours. Thyroid function studies No results for input(s): TSH, T4TOTAL, T3FREE, THYROIDAB in the last 72 hours.  Invalid input(s): FREET3 Anemia work up No results for input(s): VITAMINB12, FOLATE, FERRITIN, TIBC, IRON, RETICCTPCT in the last 72 hours. Urinalysis    Component Value Date/Time   COLORURINE RED (A) 03/12/2020 1352   APPEARANCEUR CLOUDY (A) 03/12/2020 1352   APPEARANCEUR Clear 06/05/2014 0803   LABSPEC  03/12/2020 1352    TEST NOT REPORTED DUE TO COLOR INTERFERENCE OF URINE PIGMENT   LABSPEC 1.012 06/05/2014 0803   PHURINE  03/12/2020 1352    TEST NOT REPORTED DUE TO COLOR INTERFERENCE OF URINE PIGMENT   GLUCOSEU (A) 03/12/2020 1352    TEST NOT REPORTED DUE TO COLOR INTERFERENCE OF URINE PIGMENT   GLUCOSEU  Negative 06/05/2014 0803   HGBUR (A) 03/12/2020 1352    TEST NOT REPORTED DUE TO COLOR INTERFERENCE OF URINE PIGMENT   HGBUR negative 01/17/2007 1504   BILIRUBINUR (A) 03/12/2020 1352    TEST NOT REPORTED DUE TO COLOR INTERFERENCE OF URINE PIGMENT   BILIRUBINUR Negative 06/05/2014 0803   KETONESUR (A) 03/12/2020 1352    TEST NOT REPORTED DUE TO COLOR INTERFERENCE OF URINE PIGMENT   PROTEINUR (A) 03/12/2020 1352    TEST NOT REPORTED DUE TO COLOR INTERFERENCE OF URINE PIGMENT   UROBILINOGEN 0.2 03/07/2015 2045   NITRITE (A) 03/12/2020 1352    TEST NOT REPORTED DUE TO COLOR INTERFERENCE OF URINE PIGMENT   LEUKOCYTESUR (A) 03/12/2020 1352    TEST NOT REPORTED DUE TO COLOR INTERFERENCE OF URINE PIGMENT   LEUKOCYTESUR Negative 06/05/2014 0803   Sepsis Labs Invalid input(s): PROCALCITONIN,  WBC,  LACTICIDVEN Microbiology Recent Results (from the past 240 hour(s))  Resp Panel by RT-PCR (Flu A&B, Covid) Nasopharyngeal Swab     Status: None   Collection Time: 10/01/20  4:22 AM   Specimen:  Nasopharyngeal Swab; Nasopharyngeal(NP) swabs in vial transport medium  Result Value Ref Range Status   SARS Coronavirus 2 by RT PCR NEGATIVE NEGATIVE Final    Comment: (NOTE) SARS-CoV-2 target nucleic acids are NOT DETECTED.  The SARS-CoV-2 RNA is generally detectable in upper respiratory specimens during the acute phase of infection. The lowest concentration of SARS-CoV-2 viral copies this assay can detect is 138 copies/mL. A negative result does not preclude SARS-Cov-2 infection and should not be used as the sole basis for treatment or other patient management decisions. A negative result may occur with  improper specimen collection/handling, submission of specimen other than nasopharyngeal swab, presence of viral mutation(s) within the areas targeted by this assay, and inadequate number of viral copies(<138 copies/mL). A negative result must be combined with clinical observations, patient history, and epidemiological information. The expected result is Negative.  Fact Sheet for Patients:  EntrepreneurPulse.com.au  Fact Sheet for Healthcare Providers:  IncredibleEmployment.be  This test is no t yet approved or cleared by the [place] FDA and  has been authorized for detection and/or diagnosis of SARS-CoV-2 by FDA under an Emergency Use Authorization (EUA). This EUA will remain  in effect (meaning this test can be used) for the duration of the COVID-19 declaration under Section 564(b)(1) of the Act, 21 U.S.C.section 360bbb-3(b)(1), unless the authorization is terminated  or revoked sooner.       Influenza A by PCR NEGATIVE NEGATIVE Final   Influenza B by PCR NEGATIVE NEGATIVE Final    Comment: (NOTE) The Xpert Xpress SARS-CoV-2/FLU/RSV plus assay is intended as an aid in the diagnosis of influenza from Nasopharyngeal swab specimens and should not be used as a sole basis for treatment. Nasal washings and aspirates are unacceptable for  Xpert Xpress SARS-CoV-2/FLU/RSV testing.  Fact Sheet for Patients: EntrepreneurPulse.com.au  Fact Sheet for Healthcare Providers: IncredibleEmployment.be  This test is not yet approved or cleared by the [place] FDA and has been authorized for detection and/or diagnosis of SARS-CoV-2 by FDA under an Emergency Use Authorization (EUA). This EUA will remain in effect (meaning this test can be used) for the duration of the COVID-19 declaration under Section 564(b)(1) of the Act, 21 U.S.C. section 360bbb-3(b)(1), unless the authorization is terminated or revoked.  Performed at [facility], [address] [place], [place] [address]      Total time spend on discharging this patient, including the last  patient exam, discussing the hospital stay, instructions for ongoing care as it relates to all pertinent caregivers, as well as preparing the medical discharge records, prescriptions, and/or referrals as applicable, is 30 minutes.    Enzo Bi, MD  [facility] 10/04/2020, 11:03 AM

## 2020-10-04 NOTE — Progress Notes (Signed)
Patient discharged to home with all belongings wheeled out unit by transport.  Patient A+Ox4.  VSS.  Medications and discharge instructions reviewed with Stacie Glaze, RN. All questions answered.  PIV removed x 1, no bleeding, intact.  Patient verbalized understanding of signs and symptoms of infection.  Patient satisfied with overall care at [facility] and agreed to follow up with all appointments as listed on AVS.

## 2020-10-04 NOTE — Progress Notes (Signed)
Mobility Specialist - Progress Note   10/04/20 1100  Mobility  Activity Ambulated in hall  Level of Assistance Independent  Assistive Device None  Distance Ambulated (ft) 320 ft  Mobility Ambulated independently in hallway  Mobility Response Tolerated well  Mobility performed by Mobility specialist  $Mobility charge 1 Mobility    During mobility: 94 HR, 94% SpO2 Post-mobility: 98 HR, 98% SpO2   Pt ambulated in hallway independently. No LOB. Denied SOB on RA. Denied pain.    Kathee Delton Mobility Specialist 10/04/20, 11:18 AM

## 2020-10-04 NOTE — Telephone Encounter (Signed)
Patient has been scheduled for a hospital follow up on 10/09/20. Patient is being released from hospital on 10/04/2020.

## 2020-10-04 NOTE — Telephone Encounter (Signed)
Noted. Will follow.  

## 2020-10-04 NOTE — Plan of Care (Signed)

## 2020-10-07 ENCOUNTER — Telehealth: Payer: Self-pay

## 2020-10-07 NOTE — Telephone Encounter (Signed)
Transition Care Management Unsuccessful Follow-up Telephone Call  Date of discharge and from where:  10/04/20 from [facility]  Attempts:  1st Attempt  Reason for unsuccessful TCM follow-up call:  Unable to reach patient

## 2020-10-08 NOTE — Telephone Encounter (Signed)
Attempted to call patient x2 for scheduled hfu. No answer. Unable to leave message. Appointment listed below has been previously canceled.

## 2020-10-08 NOTE — Telephone Encounter (Signed)
Transition Care Management Unsuccessful Follow-up Telephone Call  Date of discharge and from where:  10/04/20 from [facility]  Attempts:  2nd Attempt  Reason for unsuccessful TCM follow-up call:  Unable to reach patient HFU previously scheduled 10/09/20 has been canceled. Will follow as directed.

## 2020-10-09 ENCOUNTER — Ambulatory Visit: Payer: Medicare Other | Admitting: Family Medicine

## 2020-10-09 NOTE — Telephone Encounter (Signed)
Transition Care Management Unsuccessful Follow-up Telephone Call  Date of discharge and from where:  10/04/20 from [facility]  Attempts:  3rd Attempt  Reason for unsuccessful TCM follow-up call:  Unable to reach patient No transition of care scheduled at this time.

## 2020-10-15 ENCOUNTER — Encounter: Payer: Medicare Other | Admitting: Gastroenterology

## 2020-10-17 ENCOUNTER — Ambulatory Visit: Payer: Medicare Other | Admitting: Medical

## 2020-10-30 ENCOUNTER — Ambulatory Visit: Payer: Medicare Other

## 2020-11-05 ENCOUNTER — Telehealth: Payer: Self-pay

## 2020-11-05 ENCOUNTER — Ambulatory Visit: Payer: Medicare Other

## 2020-11-05 NOTE — Telephone Encounter (Signed)
Patient called into access nurse at 7:58am to Reschedule the Upcoming appointment on Wednesday. Patient would like a call back.

## 2020-11-06 ENCOUNTER — Ambulatory Visit: Payer: Medicare Other | Admitting: Family Medicine

## 2020-11-14 ENCOUNTER — Inpatient Hospital Stay: Payer: Medicare Other | Attending: Oncology | Admitting: Oncology

## 2020-11-14 ENCOUNTER — Other Ambulatory Visit: Payer: Self-pay

## 2020-11-14 ENCOUNTER — Encounter: Payer: Self-pay | Admitting: Oncology

## 2020-11-14 ENCOUNTER — Inpatient Hospital Stay: Payer: Medicare Other

## 2020-11-14 VITALS — BP 119/71 | HR 91 | Temp 99.8°F | Resp 18 | Ht 62.0 in | Wt 166.2 lb

## 2020-11-14 VITALS — BP 104/61 | HR 78 | Temp 98.0°F | Resp 18

## 2020-11-14 DIAGNOSIS — Z8269 Family history of other diseases of the musculoskeletal system and connective tissue: Secondary | ICD-10-CM | POA: Insufficient documentation

## 2020-11-14 DIAGNOSIS — E785 Hyperlipidemia, unspecified: Secondary | ICD-10-CM | POA: Diagnosis not present

## 2020-11-14 DIAGNOSIS — Z79899 Other long term (current) drug therapy: Secondary | ICD-10-CM | POA: Diagnosis not present

## 2020-11-14 DIAGNOSIS — Z8249 Family history of ischemic heart disease and other diseases of the circulatory system: Secondary | ICD-10-CM | POA: Diagnosis not present

## 2020-11-14 DIAGNOSIS — I10 Essential (primary) hypertension: Secondary | ICD-10-CM | POA: Insufficient documentation

## 2020-11-14 DIAGNOSIS — Z800 Family history of malignant neoplasm of digestive organs: Secondary | ICD-10-CM | POA: Diagnosis not present

## 2020-11-14 DIAGNOSIS — I252 Old myocardial infarction: Secondary | ICD-10-CM | POA: Diagnosis not present

## 2020-11-14 DIAGNOSIS — C641 Malignant neoplasm of right kidney, except renal pelvis: Secondary | ICD-10-CM | POA: Insufficient documentation

## 2020-11-14 DIAGNOSIS — Z83511 Family history of glaucoma: Secondary | ICD-10-CM | POA: Diagnosis not present

## 2020-11-14 DIAGNOSIS — Z8371 Family history of colonic polyps: Secondary | ICD-10-CM | POA: Diagnosis not present

## 2020-11-14 DIAGNOSIS — D509 Iron deficiency anemia, unspecified: Secondary | ICD-10-CM | POA: Insufficient documentation

## 2020-11-14 DIAGNOSIS — Z836 Family history of other diseases of the respiratory system: Secondary | ICD-10-CM | POA: Insufficient documentation

## 2020-11-14 DIAGNOSIS — Z803 Family history of malignant neoplasm of breast: Secondary | ICD-10-CM | POA: Insufficient documentation

## 2020-11-14 DIAGNOSIS — Z823 Family history of stroke: Secondary | ICD-10-CM | POA: Insufficient documentation

## 2020-11-14 LAB — CBC WITH DIFFERENTIAL/PLATELET
Abs Immature Granulocytes: 0.01 10*3/uL (ref 0.00–0.07)
Basophils Absolute: 0 10*3/uL (ref 0.0–0.1)
Basophils Relative: 0 %
Eosinophils Absolute: 0.2 10*3/uL (ref 0.0–0.5)
Eosinophils Relative: 3 %
HCT: 31.9 % — ABNORMAL LOW (ref 36.0–46.0)
Hemoglobin: 9.9 g/dL — ABNORMAL LOW (ref 12.0–15.0)
Immature Granulocytes: 0 %
Lymphocytes Relative: 27 %
Lymphs Abs: 1.8 10*3/uL (ref 0.7–4.0)
MCH: 25.6 pg — ABNORMAL LOW (ref 26.0–34.0)
MCHC: 31 g/dL (ref 30.0–36.0)
MCV: 82.6 fL (ref 80.0–100.0)
Monocytes Absolute: 0.5 10*3/uL (ref 0.1–1.0)
Monocytes Relative: 8 %
Neutro Abs: 4.2 10*3/uL (ref 1.7–7.7)
Neutrophils Relative %: 62 %
Platelets: 273 10*3/uL (ref 150–400)
RBC: 3.86 MIL/uL — ABNORMAL LOW (ref 3.87–5.11)
RDW: 14.7 % (ref 11.5–15.5)
WBC: 6.8 10*3/uL (ref 4.0–10.5)
nRBC: 0 % (ref 0.0–0.2)

## 2020-11-14 LAB — IRON AND TIBC
Iron: 21 ug/dL — ABNORMAL LOW (ref 28–170)
Saturation Ratios: 4 % — ABNORMAL LOW (ref 10.4–31.8)
TIBC: 479 ug/dL — ABNORMAL HIGH (ref 250–450)
UIBC: 458 ug/dL

## 2020-11-14 LAB — FERRITIN: Ferritin: 6 ng/mL — ABNORMAL LOW (ref 11–307)

## 2020-11-14 MED ORDER — SODIUM CHLORIDE 0.9 % IV SOLN
Freq: Once | INTRAVENOUS | Status: AC
Start: 2020-11-14 — End: 2020-11-14
  Filled 2020-11-14: qty 250

## 2020-11-14 MED ORDER — SODIUM CHLORIDE 0.9 % IV SOLN
510.0000 mg | Freq: Once | INTRAVENOUS | Status: AC
  Administered 2020-11-14: 510 mg via INTRAVENOUS
  Filled 2020-11-14: qty 510

## 2020-11-14 NOTE — Progress Notes (Signed)
Per Dr. Grayland Ormond okay to proceed with [facility] prior to lab results.

## 2020-11-14 NOTE — Patient Instructions (Signed)
Ferumoxytol injection What is this medication? FERUMOXYTOL is an iron complex. Iron is used to make healthy red blood cells, which carry oxygen and nutrients throughout the body. This medicine is used totreat iron deficiency anemia. This medicine may be used for other purposes; ask your health care provider orpharmacist if you have questions. COMMON BRAND NAME(S): Feraheme What should I tell my care team before I take this medication? They need to know if you have any of these conditions: anemia not caused by low iron levels high levels of iron in the blood magnetic resonance imaging (MRI) test scheduled an unusual or allergic reaction to iron, other medicines, foods, dyes, or preservatives pregnant or trying to get pregnant breast-feeding How should I use this medication? This medicine is for injection into a vein. It is given by a health careprofessional in a hospital or clinic setting. Talk to your pediatrician regarding the use of this medicine in children.Special care may be needed. Overdosage: If you think you have taken too much of this medicine contact apoison control center or emergency room at once. NOTE: This medicine is only for you. Do not share this medicine with others. What if I miss a dose? It is important not to miss your dose. Call your doctor or health careprofessional if you are unable to keep an appointment. What may interact with this medication? This medicine may interact with the following medications: other iron products This list may not describe all possible interactions. Give your health care provider a list of all the medicines, herbs, non-prescription drugs, or dietary supplements you use. Also tell them if you smoke, drink alcohol, or use illegaldrugs. Some items may interact with your medicine. What should I watch for while using this medication? Visit your doctor or healthcare professional regularly. Tell your doctor or healthcare professional if your  symptoms do not start to get better or if theyget worse. You may need blood work done while you are taking this medicine. You may need to follow a special diet. Talk to your doctor. Foods that contain iron include: whole grains/cereals, dried fruits, beans, or peas, leafy greenvegetables, and organ meats (liver, kidney). What side effects may I notice from receiving this medication? Side effects that you should report to your doctor or health care professionalas soon as possible: allergic reactions like skin rash, itching or hives, swelling of the face, lips, or tongue breathing problems changes in blood pressure feeling faint or lightheaded, falls fever or chills flushing, sweating, or hot feelings swelling of the ankles or feet Side effects that usually do not require medical attention (report to yourdoctor or health care professional if they continue or are bothersome): diarrhea headache nausea, vomiting stomach pain This list may not describe all possible side effects. Call your doctor for medical advice about side effects. You may report side effects to FDA at1-800-FDA-1088. Where should I keep my medication? This drug is given in a hospital or clinic and will not be stored at home. NOTE: This sheet is a summary. It may not cover all possible information. If you have questions about this medicine, talk to your doctor, pharmacist, orhealth care provider.  2022 Elsevier/Gold Standard (2016-06-29 20:21:10)  

## 2020-11-14 NOTE — Progress Notes (Signed)
[place]  [phone] [phone]  ID: [name] OB: 1946-04-18  MR#: [id number]  QIO#:[id number]  Patient Care Team: [name], MD as PCP - General (Family Medicine) [name], MD as PCP - Cardiology (Cardiology) [name], MD (Internal Medicine) [name], MD as Consulting Physician (Cardiothoracic Surgery) [name], MD as Consulting Physician (Cardiology) Lloyd Huger, MD as Consulting Physician (Oncology)  CHIEF COMPLAINT: Iron deficiency anemia, right renal mass.  INTERVAL HISTORY: Patient returns to clinic today for repeat laboratory work, further evaluation, and consideration of additional IV Feraheme.  She currently feels well and is asymptomatic.  She does not complain of any weakness or fatigue.  She has no neurologic complaints. She denies any recent fevers or illnesses. She has a good appetite and denies weight loss.  She denies any chest pain, shortness of breath, cough, or hemoptysis.  She denies any nausea, vomiting, constipation, or diarrhea. She denies any melena or hematochezia. She has no urinary complaints.  Patient offers no specific complaints today.  REVIEW OF SYSTEMS:   Review of Systems  Constitutional: Negative.  Negative for fever, malaise/fatigue and weight loss.  Respiratory: Negative.  Negative for cough and shortness of breath.   Cardiovascular: Negative.  Negative for chest pain and leg swelling.  Gastrointestinal: Negative.  Negative for abdominal pain, blood in stool and melena.  Genitourinary: Negative.  Negative for hematuria.  Musculoskeletal: Negative.  Negative for joint pain.  Skin: Negative.  Negative for rash.  Neurological: Negative.  Negative for dizziness, sensory change, focal weakness, weakness and headaches.  Psychiatric/Behavioral: Negative.  The patient is not nervous/anxious and does not have insomnia.    As per HPI. Otherwise, a  complete review of systems is negative.  PAST MEDICAL HISTORY: Past Medical History:  Diagnosis Date   A-fib ([place])    Anemia    Anginal pain ([place])    Anxiety    Arthritis    CAD (coronary artery disease)    Cancer (HCC)    Carpal tunnel syndrome    bilateral   Cataract    bil removed cataracts   Coronary artery disease    2x stents, Dr. Clayborn Bigness   Depression    Dysrhythmia    Family history of adverse reaction to anesthesia    "brother; S/P lipotripsy in [place]; transferred to [facility]; had to put him on life support for 2 days"   GERD (gastroesophageal reflux disease)    Headache    "weekly" (09/13/2014)   History of blood transfusion 05/2014   "we haven't figured out why I needed it yet" (09/13/2014)   History of hiatal hernia    Hyperglycemia    Hyperlipidemia    Hypertension    Kidney stone    Myocardial infarct ([place])    Myocardial infarct, old 1999   15 years ago   Obesity    PONV (postoperative nausea and vomiting)    Renal mass    S/P CABG x 4 09/18/2014   LIMA to LAD, SVG to PDA-dRCA sequentially, SVG to OM, EVH via right thigh    Sleep apnea    Sleep apnea    "don't use a mask" (09/13/2014)   Unstable angina ([place])     PAST SURGICAL HISTORY: Past Surgical History:  Procedure Laterality Date   ABDOMINAL HYSTERECTOMY     APPENDECTOMY     BACK SURGERY     BLADDER SURGERY     CARDIAC CATHETERIZATION  06/2014  CARDIAC CATHETERIZATION N/A 06/16/2016   Procedure: Left Heart Cath and Cors/Grafts Angiography;  Surgeon: Burnell Blanks, MD;  Location: [facility];  Service: Cardiovascular;  Laterality: N/A;   CARPAL TUNNEL RELEASE Left    CATARACT EXTRACTION W/ INTRAOCULAR LENS  IMPLANT, BILATERAL     CHOLECYSTECTOMY     COLONOSCOPY     CORONARY ANGIOPLASTY WITH STENT PLACEMENT  1999   [facility] x2 stent   CORONARY ARTERY BYPASS GRAFT N/A 09/18/2014   Procedure: CORONARY ARTERY BYPASS GRAFTING (CABG)times four using LIMA  to LAD:SVG to  PD and  DIST RCA;SVG to OM;, EVH Right thigh;  Surgeon: [name], MD;  Location: [facility];  Service: Open Heart Surgery;  Laterality: N/A;   CYSTOSCOPY W/ RETROGRADES Left 03/15/2020   Procedure: CYSTOSCOPY WITH RETROGRADE PYELOGRAM;  Surgeon: Billey Co, MD;  Location: [facility];  Service: Urology;  Laterality: Left;   CYSTOSCOPY WITH STENT PLACEMENT Left 02/20/2020   Procedure: CYSTOSCOPY WITH STENT PLACEMENT;  Surgeon: Billey Co, MD;  Location: [facility];  Service: Urology;  Laterality: Left;   CYSTOSCOPY/URETEROSCOPY/HOLMIUM LASER/STENT PLACEMENT Left 03/15/2020   Procedure: CYSTOSCOPY/URETEROSCOPY;  Surgeon: Billey Co, MD;  Location: [facility];  Service: Urology;  Laterality: Left;   ESOPHAGOGASTRODUODENOSCOPY N/A 09/17/2014   Procedure: ESOPHAGOGASTRODUODENOSCOPY (EGD);  Surgeon: Inda Castle, MD;  Location: [place];  Service: Endoscopy;  Laterality: N/A;   heart stents     [place] Right X 3   "last one was in the 1990's; still have hernia there now" (09/13/2014)   [place]  1990's?   LUMBAR SPINE SURGERY     removal of ovaries     TEE WITHOUT CARDIOVERSION N/A 09/18/2014   Procedure: TRANSESOPHAGEAL ECHOCARDIOGRAM (TEE);  Surgeon: [name], MD;  Location: [place];  Service: Open Heart Surgery;  Laterality: N/A;   UPPER GASTROINTESTINAL ENDOSCOPY      FAMILY HISTORY: Family History  Problem Relation Age of Onset   Coronary artery disease Mother    Osteoporosis Mother    Heart disease Mother    Stroke Mother    Heart attack Mother    Hypertension Mother    Hypertension Father    Coronary artery disease Father    COPD Father    Heart disease Father    Heart attack Father    Glaucoma Father    Osteoporosis Father    Emphysema Father    Cancer Sister 65       colon   Hypertension Sister    Colon cancer Sister        dx in her 23's    Breast cancer Sister    Colon polyps Sister    Stroke Brother    Hypertension Brother    Colon polyps Brother    Stroke Maternal Grandfather    Stroke Maternal Grandmother    Osteoporosis Maternal Grandmother    Hypertension Maternal Grandmother    Hypertension Paternal Grandmother    Hypertension Paternal Grandfather    Breast cancer Maternal Aunt        2 aunts-50's   Esophageal cancer Neg Hx    Stomach cancer Neg Hx    Rectal cancer Neg Hx    Pancreatic cancer Neg Hx     ADVANCED DIRECTIVES (Y/N):  N  HEALTH MAINTENANCE: Social History   Tobacco Use  Smoking status: Never   Smokeless tobacco: Never  Vaping Use   Vaping Use: Never used  Substance Use Topics   Alcohol use: Not Currently    Alcohol/week: 0.0 standard drinks   Drug use: No     Colonoscopy:  PAP:  Bone density:  Lipid panel:  Allergies  Allergen Reactions   Ferrous Fumarate Other (See Comments)    STOMACH ISSUES STOMACH ISSUES STOMACH ISSUES   Lipitor [Atorvastatin] Other (See Comments)    myalgia    Current Outpatient Medications  Medication Sig Dispense Refill   acetaminophen (TYLENOL) 500 MG tablet Take 500 mg by mouth every 6 (six) hours as needed for headache.     amLODipine (NORVASC) 5 MG tablet TAKE 1 TABLET BY MOUTH ONCE DAILY 90 tablet 3   aspirin EC 81 MG tablet Take 1 tablet (81 mg total) by mouth daily.     ezetimibe (ZETIA) 10 MG tablet Take 1 tablet (10 mg total) by mouth daily. 90 tablet 3   isosorbide mononitrate (IMDUR) 30 MG 24 hr tablet TAKE 1 TABLET BY MOUTH ONCE DAILY 90 tablet 1   lisinopril (ZESTRIL) 10 MG tablet Take 1 tablet (10 mg total) by mouth daily. 90 tablet 1   metoprolol succinate (TOPROL-XL) 50 MG 24 hr tablet TAKE 1 TABLET BY MOUTH ONCE DAILY 30 tablet 3   nitroGLYCERIN (NITROSTAT) 0.4 MG SL tablet Place 1 tablet (0.4 mg total) under the tongue every 5 (five) minutes as needed for chest pain. Take for up to 3 doses and then call 911 if still having chest  pain. 50 tablet 3   ondansetron (ZOFRAN) 4 MG tablet Take 1 tablet (4 mg total) by mouth every 8 (eight) hours as needed for up to 10 doses for nausea or vomiting. 10 tablet 0   rosuvastatin (CRESTOR) 40 MG tablet TAKE 1 TABLET BY MOUTH ONCE DAILY 90 tablet 1   No current facility-administered medications for this visit.    OBJECTIVE: Vitals:   11/14/20 1417  BP: 119/71  Pulse: 91  Resp: 18  Temp: 99.8 F (37.7 C)  SpO2: 99%     Body mass index is 30.4 kg/m.    ECOG FS:0 - Asymptomatic  General: Well-developed, well-nourished, no acute distress. Eyes: Pink conjunctiva, anicteric sclera. HEENT: Normocephalic, moist mucous membranes. Lungs: No audible wheezing or coughing. Heart: Regular rate and rhythm. Abdomen: Soft, nontender, no obvious distention. Musculoskeletal: No edema, cyanosis, or clubbing. Neuro: Alert, answering all questions appropriately. Cranial nerves grossly intact. Skin: No rashes or petechiae noted. Psych: Normal affect.   LAB RESULTS:  Lab Results  Component Value Date   NA 138 10/04/2020   K 3.6 10/04/2020   CL 110 10/04/2020   CO2 23 10/04/2020   GLUCOSE 133 (H) 10/04/2020   BUN 22 10/04/2020   CREATININE 1.15 (H) 10/04/2020   CALCIUM 7.9 (L) 10/04/2020   PROT 7.4 10/01/2020   ALBUMIN 4.2 10/01/2020   AST 20 10/01/2020   ALT 14 10/01/2020   ALKPHOS 55 10/01/2020   BILITOT 0.9 10/01/2020   GFRNONAA 51 (L) 10/04/2020   GFRAA >60 02/24/2020    Lab Results  Component Value Date   WBC 6.8 11/14/2020   NEUTROABS 4.2 11/14/2020   HGB 9.9 (L) 11/14/2020   HCT 31.9 (L) 11/14/2020   MCV 82.6 11/14/2020   PLT 273 11/14/2020   Lab Results  Component Value Date   IRON 21 (L) 11/14/2020   TIBC 479 (H) 11/14/2020   IRONPCTSAT 4 (L) 11/14/2020  Lab Results  Component Value Date   FERRITIN 6 (L) 11/14/2020     STUDIES: No results found.   ASSESSMENT: Iron deficiency anemia.  PLAN:    1. Iron deficiency anemia: Patient reports she  cannot tolerate oral iron supplementation.  Her hemoglobin and iron stores have declined.  Previously, all of her other laboratory work was either negative or within normal limits.  Patient had a normal endoscopy in May 2017 and a normal colonoscopy in August 2014.  Patient will likely require repeat colonoscopy in the near future.  Proceed with 510 mg IV Feraheme today.  Return to clinic in 1 week for a second infusion.  Patient then return to clinic in 3 months with repeat laboratory work, further evaluation, and continuation of treatment if needed. 2.  Right renal cell carcinoma: Patient underwent partial nephrectomy on July 04, 2020 at [facility] revealing a stage I malignancy.  Continue follow-up and imaging with them.  I spent a total of 30 minutes reviewing chart data, face-to-face evaluation with the patient, counseling and coordination of care as detailed above.   Patient expressed understanding and was in agreement with this plan. She also understands that She can call clinic at any time with any questions, concerns, or complaints.    Lloyd Huger, MD   11/16/2020 3:07 PM

## 2020-11-16 ENCOUNTER — Encounter: Payer: Self-pay | Admitting: Oncology

## 2020-11-20 ENCOUNTER — Inpatient Hospital Stay: Payer: Medicare Other

## 2020-11-20 VITALS — BP 114/72 | HR 68 | Temp 97.6°F | Resp 20

## 2020-11-20 DIAGNOSIS — D509 Iron deficiency anemia, unspecified: Secondary | ICD-10-CM | POA: Diagnosis not present

## 2020-11-20 DIAGNOSIS — E785 Hyperlipidemia, unspecified: Secondary | ICD-10-CM | POA: Diagnosis not present

## 2020-11-20 DIAGNOSIS — C641 Malignant neoplasm of right kidney, except renal pelvis: Secondary | ICD-10-CM | POA: Diagnosis not present

## 2020-11-20 DIAGNOSIS — I10 Essential (primary) hypertension: Secondary | ICD-10-CM | POA: Diagnosis not present

## 2020-11-20 DIAGNOSIS — Z79899 Other long term (current) drug therapy: Secondary | ICD-10-CM | POA: Diagnosis not present

## 2020-11-20 DIAGNOSIS — I252 Old myocardial infarction: Secondary | ICD-10-CM | POA: Diagnosis not present

## 2020-11-20 MED ORDER — SODIUM CHLORIDE 0.9 % IV SOLN
Freq: Once | INTRAVENOUS | Status: AC
  Filled 2020-11-20: qty 250

## 2020-11-20 MED ORDER — SODIUM CHLORIDE 0.9 % IV SOLN
510.0000 mg | Freq: Once | INTRAVENOUS | Status: AC
  Administered 2020-11-20: 510 mg via INTRAVENOUS
  Filled 2020-11-20: qty 510

## 2020-11-20 NOTE — Progress Notes (Signed)
Pt tolerated feraheme infusion well with no problems or concerns reported.  Pt left infusion suite stable and ambulatory.

## 2020-11-20 NOTE — Patient Instructions (Signed)
Ferumoxytol injection What is this medication? FERUMOXYTOL is an iron complex. Iron is used to make healthy red blood cells, which carry oxygen and nutrients throughout the body. This medicine is used totreat iron deficiency anemia. This medicine may be used for other purposes; ask your health care provider orpharmacist if you have questions. COMMON BRAND NAME(S): Feraheme What should I tell my care team before I take this medication? They need to know if you have any of these conditions: anemia not caused by low iron levels high levels of iron in the blood magnetic resonance imaging (MRI) test scheduled an unusual or allergic reaction to iron, other medicines, foods, dyes, or preservatives pregnant or trying to get pregnant breast-feeding How should I use this medication? This medicine is for injection into a vein. It is given by a health careprofessional in a hospital or clinic setting. Talk to your pediatrician regarding the use of this medicine in children.Special care may be needed. Overdosage: If you think you have taken too much of this medicine contact apoison control center or emergency room at once. NOTE: This medicine is only for you. Do not share this medicine with others. What if I miss a dose? It is important not to miss your dose. Call your doctor or health careprofessional if you are unable to keep an appointment. What may interact with this medication? This medicine may interact with the following medications: other iron products This list may not describe all possible interactions. Give your health care provider a list of all the medicines, herbs, non-prescription drugs, or dietary supplements you use. Also tell them if you smoke, drink alcohol, or use illegaldrugs. Some items may interact with your medicine. What should I watch for while using this medication? Visit your doctor or healthcare professional regularly. Tell your doctor or healthcare professional if your  symptoms do not start to get better or if theyget worse. You may need blood work done while you are taking this medicine. You may need to follow a special diet. Talk to your doctor. Foods that contain iron include: whole grains/cereals, dried fruits, beans, or peas, leafy greenvegetables, and organ meats (liver, kidney). What side effects may I notice from receiving this medication? Side effects that you should report to your doctor or health care professionalas soon as possible: allergic reactions like skin rash, itching or hives, swelling of the face, lips, or tongue breathing problems changes in blood pressure feeling faint or lightheaded, falls fever or chills flushing, sweating, or hot feelings swelling of the ankles or feet Side effects that usually do not require medical attention (report to yourdoctor or health care professional if they continue or are bothersome): diarrhea headache nausea, vomiting stomach pain This list may not describe all possible side effects. Call your doctor for medical advice about side effects. You may report side effects to FDA at1-800-FDA-1088. Where should I keep my medication? This drug is given in a hospital or clinic and will not be stored at home. NOTE: This sheet is a summary. It may not cover all possible information. If you have questions about this medicine, talk to your doctor, pharmacist, orhealth care provider.  2022 Elsevier/Gold Standard (2016-06-29 20:21:10)  

## 2020-11-26 ENCOUNTER — Telehealth: Payer: Self-pay | Admitting: Family Medicine

## 2020-11-26 NOTE — Telephone Encounter (Signed)
Left message for patient to call back and schedule Medicare Annual Wellness Visit (AWV) in office.   If not able to come in office, please offer to do virtually or by telephone.   Due for AWVI  Please schedule at anytime with Nurse Health Advisor.   

## 2020-11-28 IMAGING — US US RENAL
1 series · 14 of 25 positions shown · non-contrast
Comparison: Prior CT from 02/19/2020.

CLINICAL DATA: Follow-up examination for left ureteral stone,
evaluate hydronephrosis.

EXAM:
RENAL / URINARY TRACT ULTRASOUND COMPLETE

[Series 1: us renal · 0.25mm/px · 39 acquisitions, 14 frames shown]
[im 1/39]
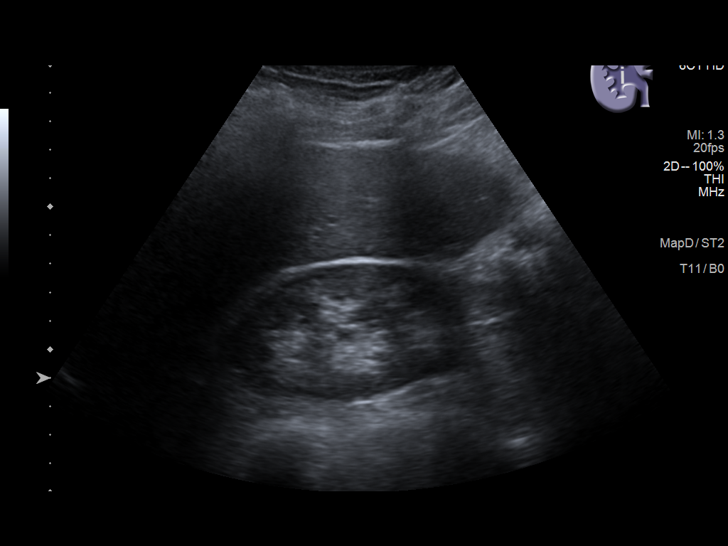
[im 4/39]
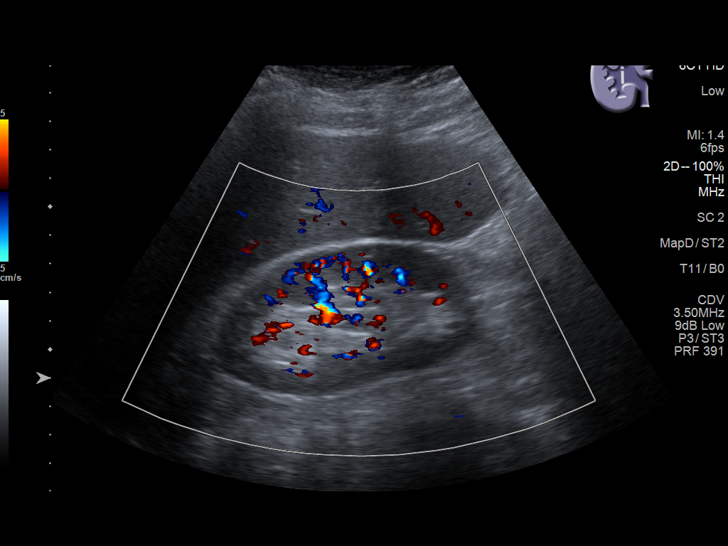
[im 7/39]
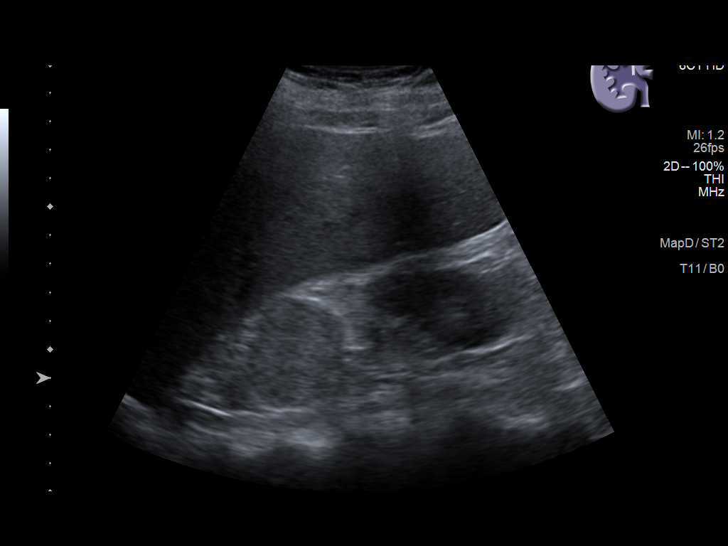
[im 10/39]
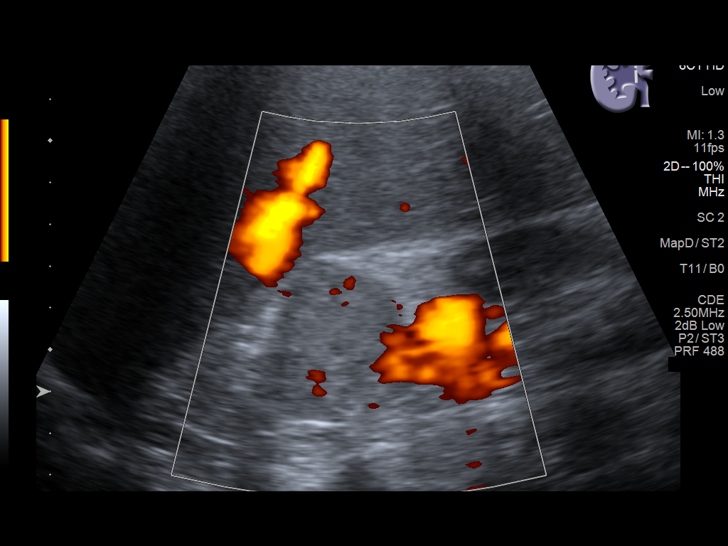
[im 13/39]
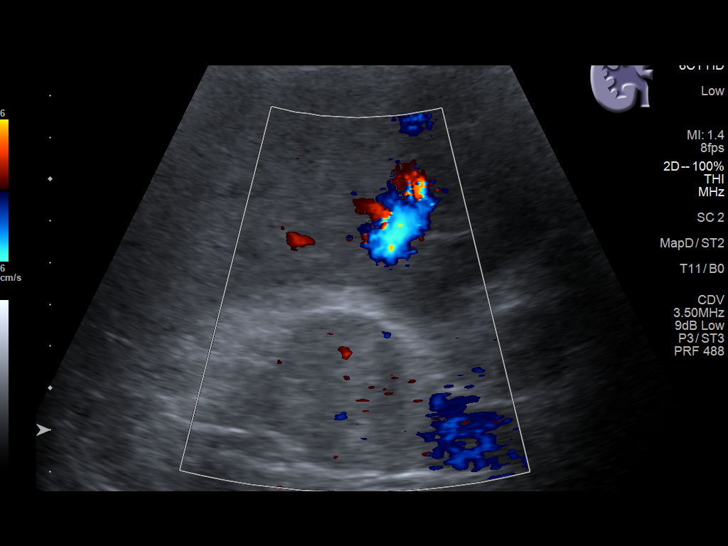
[im 15/39]
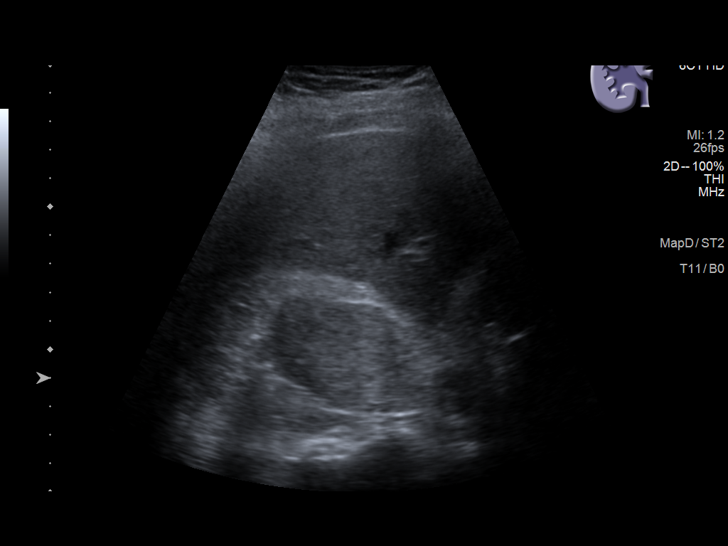
[im 18/39]
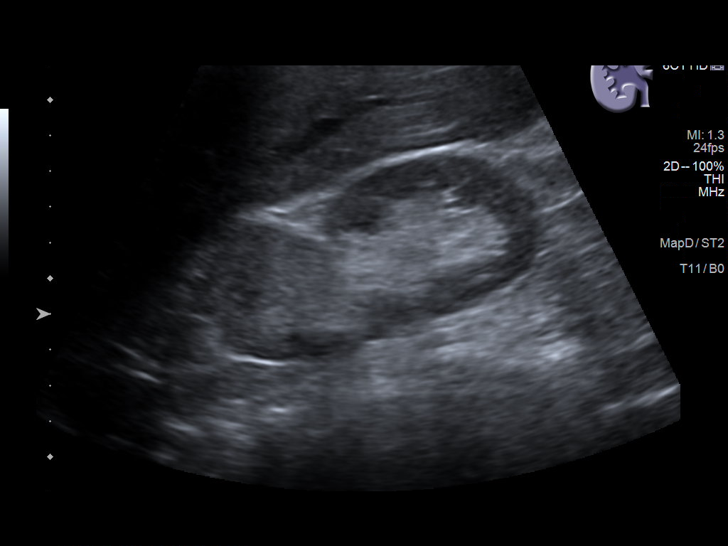
[im 21/39]
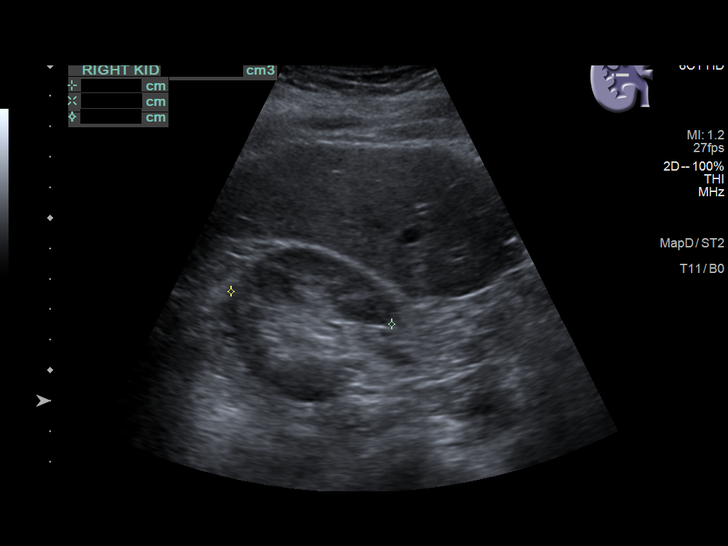
[im 24/39]
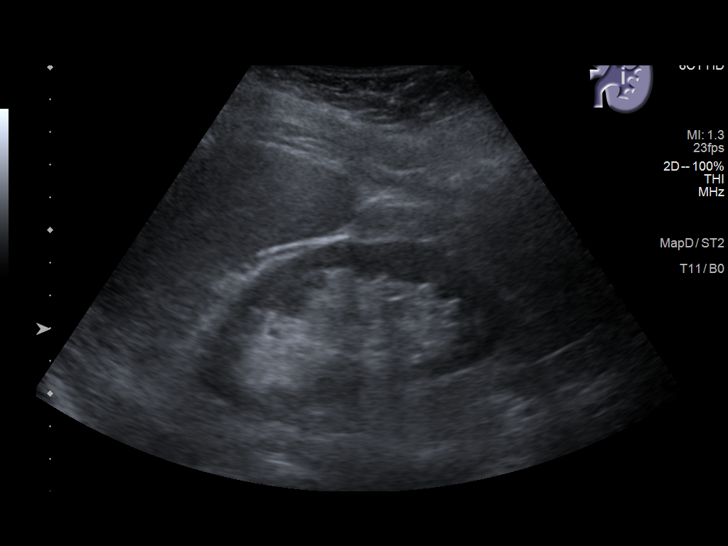
[im 26/39]
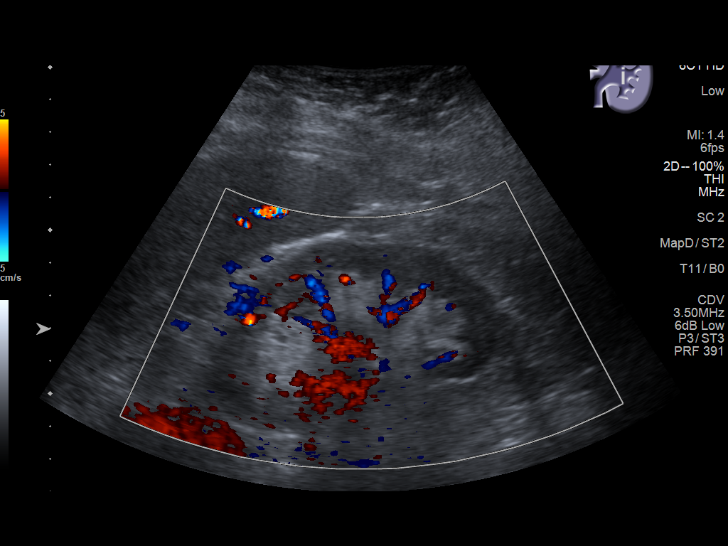
[im 29/39]
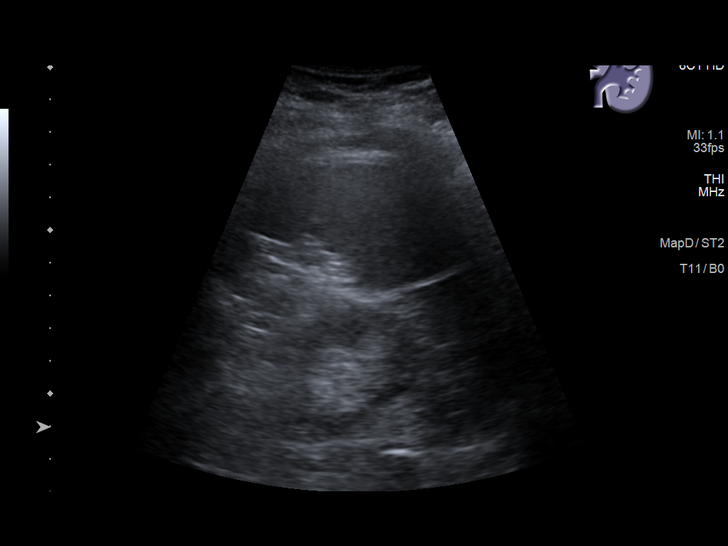
[im 32/39]
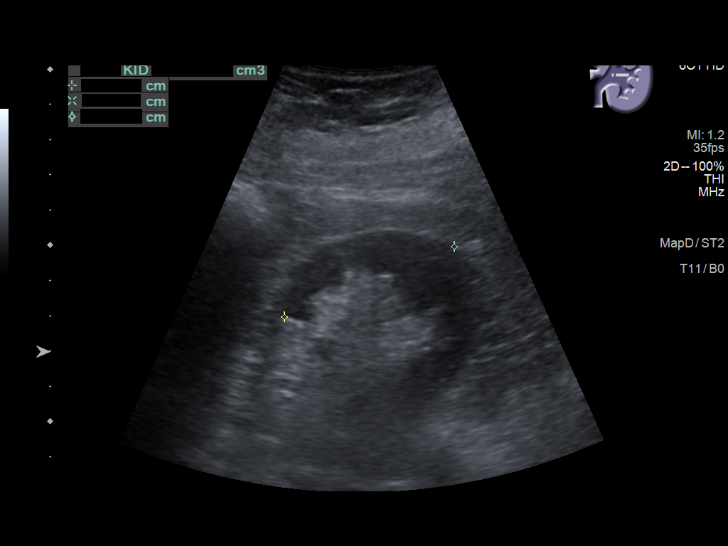
[im 35/39]
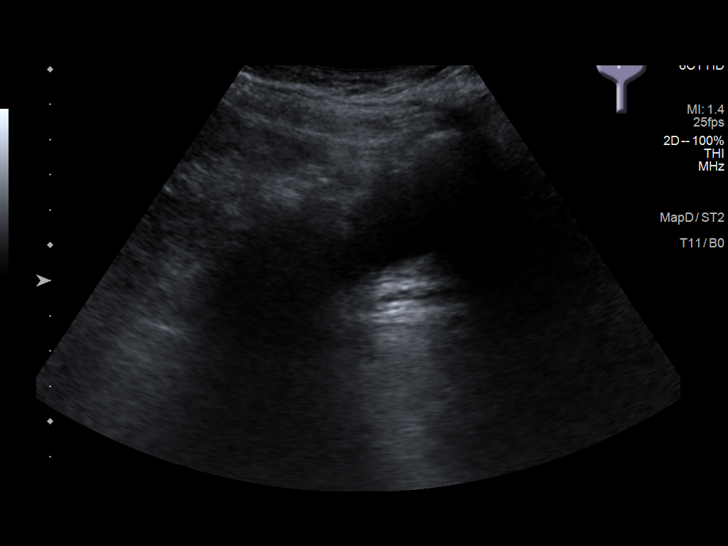
[im 39/39]
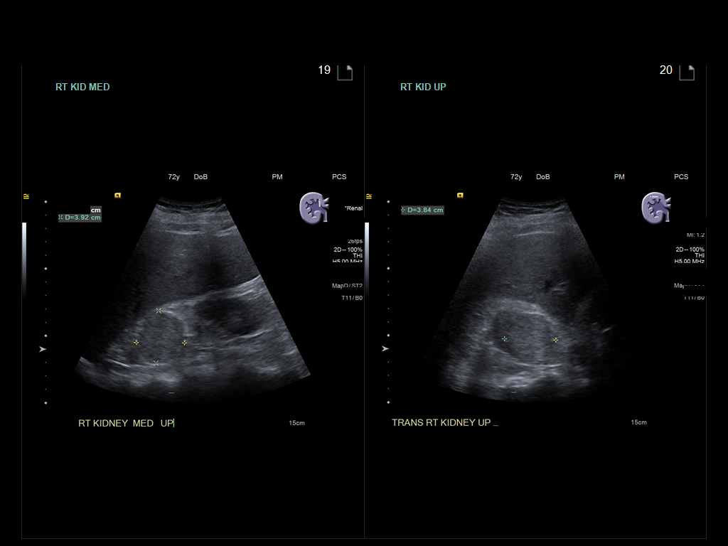

[14 of 25 positions shown; findings below may reference images not displayed]

FINDINGS: Right Kidney:

Renal measurements: 9.5 x 4.7 x 5.4 cm = volume: 127.4 mL. Renal
echogenicity within normal limits. No nephrolithiasis or
hydronephrosis. Solid mass positioned at the upper pole measures
x 3.9 x 3.8 cm, concerning for a primary renal neoplasm. Associated
internal vascularity.

Left Kidney:

Renal measurements: 9.5 x 4.6 x 5.2 cm = volume: 119.4 mL. Renal
echogenicity within normal limits. No shadowing echogenic calculi.
Previously seen left-sided hydronephrosis has resolved. No focal
renal mass.

Bladder:

Appears normal for degree of bladder distention. Neither ureteral
jet visualized.

Other:

None.
IMPRESSION: 1. Interval resolution of previously seen left-sided hydronephrosis.
2. 3.9 cm solid mass at the upper pole of the right kidney,
concerning for a primary renal neoplasm/RCC. Finding better seen on
recent CT from 02/19/2020.

## 2020-12-05 ENCOUNTER — Encounter: Payer: Self-pay | Admitting: Family Medicine

## 2020-12-05 DIAGNOSIS — I2510 Atherosclerotic heart disease of native coronary artery without angina pectoris: Secondary | ICD-10-CM

## 2020-12-06 MED ORDER — NITROGLYCERIN 0.4 MG SL SUBL: 0.4000 mg | SUBLINGUAL_TABLET | SUBLINGUAL | 3 refills | Status: DC | PRN

## 2020-12-11 ENCOUNTER — Ambulatory Visit: Payer: Medicare Other | Admitting: Family Medicine

## 2020-12-16 ENCOUNTER — Ambulatory Visit: Payer: Medicare Other | Admitting: Family Medicine

## 2020-12-17 ENCOUNTER — Telehealth: Payer: Self-pay | Admitting: *Deleted

## 2020-12-17 NOTE — Telephone Encounter (Signed)
Chart reviewed. She had a partial nephrectomy treating her stage I renal carcinoma, her cardiac status appears stable and her SBO resolved so we can proceed with colonoscopy and EGD as planned.

## 2020-12-17 NOTE — Telephone Encounter (Signed)
noted 

## 2020-12-17 NOTE — Telephone Encounter (Signed)
Dr. Fuller Plan,  This pt is coming for a PV on 12-31-20 and an egd/colonoscopy on 01-14-21.    I just wanted to make you aware of a couple of things- she has been treated for renal cell carcinoma in February of this 2022.  Per our PV protocol, we are to let the MD know if a patient has been treated for cancer within the last year to make sure ok to proceed. She does have an extensive cardiac hx.  Also, she was hospitalized for 3 days in May of 2022 for a SBO.  I just wanted to make sure ok to proceed as scheduled.  Thanks, [employer]

## 2020-12-18 ENCOUNTER — Other Ambulatory Visit (HOSPITAL_COMMUNITY): Payer: Self-pay | Admitting: Nephrology

## 2020-12-18 ENCOUNTER — Other Ambulatory Visit: Payer: Self-pay | Admitting: Nephrology

## 2020-12-18 DIAGNOSIS — I10 Essential (primary) hypertension: Secondary | ICD-10-CM | POA: Diagnosis not present

## 2020-12-18 DIAGNOSIS — N1831 Chronic kidney disease, stage 3a: Secondary | ICD-10-CM

## 2020-12-18 DIAGNOSIS — Z905 Acquired absence of kidney: Secondary | ICD-10-CM | POA: Diagnosis not present

## 2020-12-18 DIAGNOSIS — D631 Anemia in chronic kidney disease: Secondary | ICD-10-CM | POA: Diagnosis not present

## 2020-12-25 ENCOUNTER — Ambulatory Visit: Payer: Medicare Other

## 2020-12-26 ENCOUNTER — Ambulatory Visit
Admission: RE | Admit: 2020-12-26 | Discharge: 2020-12-26 | Disposition: A | Discharge status: Home / Self Care | Payer: Medicare Other | Source: Ambulatory Visit | Attending: Nephrology | Admitting: Nephrology

## 2020-12-26 ENCOUNTER — Ambulatory Visit: Payer: Medicare Other

## 2020-12-26 ENCOUNTER — Other Ambulatory Visit: Payer: Self-pay

## 2020-12-26 DIAGNOSIS — Z905 Acquired absence of kidney: Secondary | ICD-10-CM | POA: Diagnosis not present

## 2020-12-26 DIAGNOSIS — N1831 Chronic kidney disease, stage 3a: Secondary | ICD-10-CM | POA: Insufficient documentation

## 2020-12-26 DIAGNOSIS — N189 Chronic kidney disease, unspecified: Secondary | ICD-10-CM | POA: Diagnosis not present

## 2020-12-31 ENCOUNTER — Other Ambulatory Visit: Payer: Self-pay

## 2020-12-31 ENCOUNTER — Encounter: Payer: Self-pay | Admitting: Oncology

## 2020-12-31 ENCOUNTER — Ambulatory Visit (AMBULATORY_SURGERY_CENTER): Payer: Self-pay | Admitting: *Deleted

## 2020-12-31 VITALS — Ht 63.0 in | Wt 168.2 lb

## 2020-12-31 DIAGNOSIS — K2270 Barrett's esophagus without dysplasia: Secondary | ICD-10-CM

## 2020-12-31 DIAGNOSIS — Z86010 Personal history of colonic polyps: Secondary | ICD-10-CM

## 2020-12-31 NOTE — Progress Notes (Signed)
  No trouble with anesthesia, denies being told they were difficult to intubate, or hx/fam hx of malignant hyperthermia per pt   No egg or soy allergy  No home oxygen use   No medications for weight loss taken  Pt denies constipation issues  Pt has Suprep at home.  She states she has never had to take a Nitroglycerin

## 2021-01-01 ENCOUNTER — Encounter: Payer: Self-pay | Admitting: Family Medicine

## 2021-01-01 ENCOUNTER — Ambulatory Visit (INDEPENDENT_AMBULATORY_CARE_PROVIDER_SITE_OTHER): Payer: Medicare Other | Admitting: Family Medicine

## 2021-01-01 ENCOUNTER — Other Ambulatory Visit: Payer: Self-pay

## 2021-01-01 VITALS — BP 160/80 | HR 87 | Temp 98.8°F | Ht 62.0 in | Wt 169.0 lb

## 2021-01-01 DIAGNOSIS — N1831 Chronic kidney disease, stage 3a: Secondary | ICD-10-CM

## 2021-01-01 DIAGNOSIS — E7800 Pure hypercholesterolemia, unspecified: Secondary | ICD-10-CM | POA: Diagnosis not present

## 2021-01-01 DIAGNOSIS — Z85528 Personal history of other malignant neoplasm of kidney: Secondary | ICD-10-CM | POA: Diagnosis not present

## 2021-01-01 DIAGNOSIS — Z23 Encounter for immunization: Secondary | ICD-10-CM | POA: Diagnosis not present

## 2021-01-01 DIAGNOSIS — I2510 Atherosclerotic heart disease of native coronary artery without angina pectoris: Secondary | ICD-10-CM | POA: Diagnosis not present

## 2021-01-01 DIAGNOSIS — I10 Essential (primary) hypertension: Secondary | ICD-10-CM

## 2021-01-01 DIAGNOSIS — D509 Iron deficiency anemia, unspecified: Secondary | ICD-10-CM

## 2021-01-01 DIAGNOSIS — N183 Chronic kidney disease, stage 3 unspecified: Secondary | ICD-10-CM | POA: Insufficient documentation

## 2021-01-01 LAB — LDL CHOLESTEROL, DIRECT: Direct LDL: 94 mg/dL

## 2021-01-01 MED ORDER — TETANUS-DIPHTHERIA TOXOIDS TD 5-2 LFU IM INJ: 0.5000 mL | INJECTION | Freq: Once | INTRAMUSCULAR | 0 refills | Status: AC

## 2021-01-01 NOTE — Assessment & Plan Note (Signed)
Adequate control at home.  She will continue losartan 25 mg once daily, amlodipine 5 mg daily, Imdur 30 mg daily, metoprolol 50 mg daily.

## 2021-01-01 NOTE — Addendum Note (Signed)
Addended by: Fulton Mole D on: 01/01/2021 08:35 AM   Modules accepted: Orders

## 2021-01-01 NOTE — Assessment & Plan Note (Signed)
Status post iron infusions.  She will continue to follow with hematology.

## 2021-01-01 NOTE — Progress Notes (Signed)
Tommi Rumps, MD Phone: [phone]  Debbie Delacruz is a 75 y.o. female who presents today for f/u.  HYPERTENSION Disease Monitoring Home BP Monitoring 130/70s , reports she has not taken her medicine yet today.  Chest pain- no    Dyspnea- no Medications Compliance-  taking losartan, imdur, metoprolol, amlodipine.  Edema- no  HYPERLIPIDEMIA Symptoms Chest pain on exertion:  no   Medications: Compliance- taking crestor, Zetia right upper quadrant pain- no  Muscle aches- no  Iron deficiency anemia: Patient has seen hematology for her iron deficiency anemia.  She has received 2 iron infusions.  She follows up with them in the near future.  CKD stage III: She saw nephrology.  They placed her on losartan 25 mg daily.  She had a renal ultrasound revealed no significant abnormalities other than her right nephrectomy.  Nephrology felt as though her CKD was related to her nephrectomy.  She follows up with nephrology in the next month or so.  Renal cell carcinoma: She notes she follows up with urology in the next couple of months as well.  Social History   Tobacco Use  Smoking Status Never  Smokeless Tobacco Never    Current Outpatient Medications on File Prior to Visit  Medication Sig Dispense Refill  . acetaminophen (TYLENOL) 500 MG tablet Take 500 mg by mouth every 6 (six) hours as needed for headache.    Marland Kitchen amLODipine (NORVASC) 5 MG tablet TAKE 1 TABLET BY MOUTH ONCE DAILY 90 tablet 3  . aspirin EC 81 MG tablet Take 1 tablet (81 mg total) by mouth daily.    Marland Kitchen ezetimibe (ZETIA) 10 MG tablet Take 1 tablet (10 mg total) by mouth daily. 90 tablet 3  . isosorbide mononitrate (IMDUR) 30 MG 24 hr tablet TAKE 1 TABLET BY MOUTH ONCE DAILY 90 tablet 1  . lisinopril (ZESTRIL) 10 MG tablet Take 1 tablet (10 mg total) by mouth daily. 90 tablet 1  . losartan (COZAAR) 25 MG tablet Take by mouth daily.    . metoprolol succinate (TOPROL-XL) 50 MG 24 hr tablet TAKE 1 TABLET BY MOUTH ONCE  DAILY 30 tablet 3  . nitroGLYCERIN (NITROSTAT) 0.4 MG SL tablet Place 1 tablet (0.4 mg total) under the tongue every 5 (five) minutes as needed for chest pain. Take for up to 3 doses and then call 911 if still having chest pain. 50 tablet 3  . ondansetron (ZOFRAN) 4 MG tablet Take 1 tablet (4 mg total) by mouth every 8 (eight) hours as needed for up to 10 doses for nausea or vomiting. 10 tablet 0  . rosuvastatin (CRESTOR) 40 MG tablet TAKE 1 TABLET BY MOUTH ONCE DAILY 90 tablet 1   No current facility-administered medications on file prior to visit.     ROS see history of present illness  Objective  Physical Exam Vitals:   01/01/21 0805  BP: (!) 160/80  Pulse: 87  Temp: 98.8 F (37.1 C)  SpO2: 98%    BP Readings from Last 3 Encounters:  01/01/21 (!) 160/80  11/20/20 114/72  11/14/20 104/61   Wt Readings from Last 3 Encounters:  01/01/21 169 lb (76.7 kg)  12/31/20 168 lb 3.2 oz (76.3 kg)  11/14/20 166 lb 3.2 oz (75.4 kg)    Physical Exam Constitutional:      General: She is not in acute distress.    Appearance: She is not diaphoretic.  Cardiovascular:     Rate and Rhythm: Normal rate and regular rhythm.  Heart sounds: Normal heart sounds.  Pulmonary:     Effort: Pulmonary effort is normal.     Breath sounds: Normal breath sounds.  Musculoskeletal:     Right lower leg: No edema.     Left lower leg: No edema.  Skin:    General: Skin is warm and dry.  Neurological:     Mental Status: She is alert.     Assessment/Plan: Please see individual problem list.  Problem List Items Addressed This Visit     CKD (chronic kidney disease), stage III ([place])    Generally stable on recent lab work.  She will continue her losartan 25 mg daily.  She will continue to see nephrology.       History of renal cell cancer    She will continue to see her urologist.       Hyperlipidemia    Continue Crestor 40 mg once daily and Zetia 10 mg once daily.  Check LDL.        Relevant Orders   Direct LDL   Hypertension    Adequate control at home.  She will continue losartan 25 mg once daily, amlodipine 5 mg daily, Imdur 30 mg daily, metoprolol 50 mg daily.       Iron deficiency anemia    Status post iron infusions.  She will continue to follow with hematology.         Health Maintenance: The patient will go to the pharmacy for her COVID booster and tetanus vaccine.  She notes she has a colonoscopy and endoscopy scheduled for later this month.  Return in about 6 months (around 07/04/2021) for Hypertension.  This visit occurred during the SARS-CoV-2 public health emergency.  Safety protocols were in place, including screening questions prior to the visit, additional usage of staff PPE, and extensive cleaning of exam room while observing appropriate contact time as indicated for disinfecting solutions.    Tommi Rumps, MD [place]

## 2021-01-01 NOTE — Assessment & Plan Note (Signed)
She will continue to see her urologist.

## 2021-01-01 NOTE — Assessment & Plan Note (Signed)
Continue Crestor 40 mg once daily and Zetia 10 mg once daily.  Check LDL.

## 2021-01-01 NOTE — Assessment & Plan Note (Signed)
Generally stable on recent lab work.  She will continue her losartan 25 mg daily.  She will continue to see nephrology.

## 2021-01-01 NOTE — Patient Instructions (Addendum)
Nice to see you. We will check your LDL cholesterol today and contact you with the results. Please get your COVID booster and tetanus vaccine at the pharmacy. Please continue to check your blood pressure

## 2021-01-02 ENCOUNTER — Telehealth: Payer: Self-pay

## 2021-01-02 ENCOUNTER — Encounter: Payer: Self-pay | Admitting: Oncology

## 2021-01-02 NOTE — Telephone Encounter (Signed)
Pt returned your call about labs. Please call [phone]

## 2021-01-02 NOTE — Telephone Encounter (Signed)
Spoke to [employer]. See result note.

## 2021-01-04 ENCOUNTER — Other Ambulatory Visit: Payer: Self-pay | Admitting: Family Medicine

## 2021-01-04 DIAGNOSIS — E7800 Pure hypercholesterolemia, unspecified: Secondary | ICD-10-CM

## 2021-01-04 MED ORDER — REPATHA SURECLICK 140 MG/ML ~~LOC~~ SOAJ: 140.0000 mg | SUBCUTANEOUS | 1 refills | Status: DC

## 2021-01-06 ENCOUNTER — Encounter: Payer: Self-pay | Admitting: Family Medicine

## 2021-01-07 NOTE — Telephone Encounter (Signed)
Have you gotten a PA request for this?

## 2021-01-07 NOTE — Progress Notes (Signed)
Lab scheduled °

## 2021-01-08 ENCOUNTER — Encounter: Payer: Self-pay | Admitting: Oncology

## 2021-01-08 NOTE — Telephone Encounter (Signed)
Noted  

## 2021-01-09 ENCOUNTER — Encounter: Payer: Self-pay | Admitting: Oncology

## 2021-01-09 NOTE — Progress Notes (Signed)
TY

## 2021-01-10 ENCOUNTER — Telehealth: Payer: Self-pay | Admitting: Gastroenterology

## 2021-01-10 NOTE — Telephone Encounter (Signed)
Pt scheduled for colon next week on 8/23 withDr. Fuller Plan. She wants to know if she can do pills prep. She said that she has Sutab and just needs instructions. Pls call her.

## 2021-01-10 NOTE — Telephone Encounter (Signed)
Patient states she has the sutab at home and suprep. She request to take the pills sutab-new prep instructions sent to her Mychart for sutab-pt is aware.

## 2021-01-14 ENCOUNTER — Other Ambulatory Visit: Payer: Self-pay

## 2021-01-14 ENCOUNTER — Ambulatory Visit (AMBULATORY_SURGERY_CENTER): Payer: Medicare Other | Admitting: Gastroenterology

## 2021-01-14 ENCOUNTER — Encounter: Payer: Self-pay | Admitting: Gastroenterology

## 2021-01-14 VITALS — BP 102/53 | HR 78 | Temp 97.8°F | Resp 14 | Ht 62.0 in | Wt 168.0 lb

## 2021-01-14 DIAGNOSIS — D120 Benign neoplasm of cecum: Secondary | ICD-10-CM

## 2021-01-14 DIAGNOSIS — K31A19 Gastric intestinal metaplasia without dysplasia, unspecified site: Secondary | ICD-10-CM | POA: Diagnosis not present

## 2021-01-14 DIAGNOSIS — K2970 Gastritis, unspecified, without bleeding: Secondary | ICD-10-CM | POA: Diagnosis not present

## 2021-01-14 DIAGNOSIS — K319 Disease of stomach and duodenum, unspecified: Secondary | ICD-10-CM

## 2021-01-14 DIAGNOSIS — Z86010 Personal history of colonic polyps: Secondary | ICD-10-CM | POA: Diagnosis not present

## 2021-01-14 DIAGNOSIS — K449 Diaphragmatic hernia without obstruction or gangrene: Secondary | ICD-10-CM | POA: Diagnosis not present

## 2021-01-14 DIAGNOSIS — D124 Benign neoplasm of descending colon: Secondary | ICD-10-CM | POA: Diagnosis not present

## 2021-01-14 DIAGNOSIS — K2270 Barrett's esophagus without dysplasia: Secondary | ICD-10-CM

## 2021-01-14 DIAGNOSIS — D122 Benign neoplasm of ascending colon: Secondary | ICD-10-CM | POA: Diagnosis not present

## 2021-01-14 DIAGNOSIS — K31A0 Gastric intestinal metaplasia, unspecified: Secondary | ICD-10-CM | POA: Diagnosis not present

## 2021-01-14 DIAGNOSIS — D123 Benign neoplasm of transverse colon: Secondary | ICD-10-CM

## 2021-01-14 MED ORDER — SODIUM CHLORIDE 0.9 % IV SOLN: 500.0000 mL | Freq: Once | INTRAVENOUS | Status: DC

## 2021-01-14 MED ORDER — PANTOPRAZOLE SODIUM 40 MG PO TBEC: 40.0000 mg | DELAYED_RELEASE_TABLET | Freq: Every day | ORAL | 11 refills | Status: DC

## 2021-01-14 NOTE — Progress Notes (Signed)
See 12/18/2020 H&P. This patient is appropriate for endoscopic procedures in the ambulatory setting.

## 2021-01-14 NOTE — Patient Instructions (Addendum)
Handouts provided on barrett's esophagus, gastritis, hiatal hernia, polyps, diverticulosis and high-fiber diet.   Recommend a high-fiber diet.   Follow antireflux measures long term (see hiatal hernia handout).   Start Pantoprazole '40mg'$  by mouth every morning long term.   Avoid aspirin, ibuprofen, naproxen, or other non-steriodal anti-inflammatory drugs (NSAIDs). You may use Tylenol if needed for mild pain or fever. Except you may take the Aspirin '81mg'$  EC daily.    YOU HAD AN ENDOSCOPIC PROCEDURE TODAY AT [facility]:   Refer to the procedure report that was given to you for any specific questions about what was found during the examination.  If the procedure report does not answer your questions, please call your gastroenterologist to clarify.  If you requested that your care partner not be given the details of your procedure findings, then the procedure report has been included in a sealed envelope for you to review at your convenience later.  YOU SHOULD EXPECT: Some feelings of bloating in the abdomen. Passage of more gas than usual.  Walking can help get rid of the air that was put into your GI tract during the procedure and reduce the bloating. If you had a lower endoscopy (such as a colonoscopy or flexible sigmoidoscopy) you may notice spotting of blood in your stool or on the toilet paper. If you underwent a bowel prep for your procedure, you may not have a normal bowel movement for a few days.  Please Note:  You might notice some irritation and congestion in your nose or some drainage.  This is from the oxygen used during your procedure.  There is no need for concern and it should clear up in a day or so.  SYMPTOMS TO REPORT IMMEDIATELY:  Following lower endoscopy (colonoscopy or flexible sigmoidoscopy):  Excessive amounts of blood in the stool  Significant tenderness or worsening of abdominal pains  Swelling of the abdomen that is new, acute  Fever of 100F or  higher  Following upper endoscopy (EGD)  Vomiting of blood or coffee ground material  New chest pain or pain under the shoulder blades  Painful or persistently difficult swallowing  New shortness of breath  Fever of 100F or higher  Black, tarry-looking stools  For urgent or emergent issues, a gastroenterologist can be reached at any hour by calling [phone]. Do not use MyChart messaging for urgent concerns.    DIET:  We do recommend a small meal at first, but then you may proceed to your regular diet.  Drink plenty of fluids but you should avoid alcoholic beverages for 24 hours.  ACTIVITY:  You should plan to take it easy for the rest of today and you should NOT DRIVE or use heavy machinery until tomorrow (because of the sedation medicines used during the test).    FOLLOW UP: Our staff will call the number listed on your records 48-72 hours following your procedure to check on you and address any questions or concerns that you may have regarding the information given to you following your procedure. If we do not reach you, we will leave a message.  We will attempt to reach you two times.  During this call, we will ask if you have developed any symptoms of COVID 19. If you develop any symptoms (ie: fever, flu-like symptoms, shortness of breath, cough etc.) before then, please call [phone].  If you test positive for Covid 19 in the 2 weeks post procedure, please call and report this information to [place].  If any biopsies were taken you will be contacted by phone or by letter within the next 1-3 weeks.  Please call us at [phone] if you have not heard about the biopsies in 3 weeks.    SIGNATURES/CONFIDENTIALITY: You and/or your care partner have signed paperwork which will be entered into your electronic medical record.  These signatures attest to the fact that that the information above on your After Visit Summary has been reviewed and is understood.  Full responsibility of  the confidentiality of this discharge information lies with you and/or your care-partner.

## 2021-01-14 NOTE — Progress Notes (Signed)
Pt in recovery with monitors in place, VSS. Report given to receiving RN. Bite guard was placed with pt awake to ensure comfort. No dental or soft tissue damage noted. RN will remove the guard when the pt is awake.  

## 2021-01-14 NOTE — Progress Notes (Signed)
Called to room to assist during endoscopic procedure.  Patient ID and intended procedure confirmed with present staff. Received instructions for my participation in the procedure from the performing physician.  

## 2021-01-14 NOTE — Op Note (Signed)
[facility] Patient Name: Debbie Delacruz Procedure Date: 01/14/2021 8:54 AM MRN: [id number] Endoscopist: Ladene Artist , MD Age: 75 Referring MD:  Date of Birth: 1945-06-21 Gender: Female Account #: [id number] Procedure:                Colonoscopy Indications:              Surveillance: Personal history of adenomatous                            polyps on last colonoscopy > 5 years ago Medicines:                Monitored Anesthesia Care Procedure:                Pre-Anesthesia Assessment:                           - Prior to the procedure, a History and Physical                            was performed, and patient medications and                            allergies were reviewed. The patient's tolerance of                            previous anesthesia was also reviewed. The risks                            and benefits of the procedure and the sedation                            options and risks were discussed with the patient.                            All questions were answered, and informed consent                            was obtained. Prior Anticoagulants: The patient has                            taken no previous anticoagulant or antiplatelet                            agents. ASA Grade Assessment: III - A patient with                            severe systemic disease. After reviewing the risks                            and benefits, the patient was deemed in                            satisfactory condition to undergo the procedure.  After obtaining informed consent, the colonoscope                            was passed under direct vision. Throughout the                            procedure, the patient's blood pressure, pulse, and                            oxygen saturations were monitored continuously. The                            Olympus CF-HQ190L ([id number]) Colonoscope was                            introduced through the  anus and advanced to the the                            cecum, identified by appendiceal orifice and                            ileocecal valve. The ileocecal valve, appendiceal                            orifice, and rectum were photographed. The quality                            of the bowel preparation was good. The colonoscopy                            was performed without difficulty. The patient                            tolerated the procedure well. Scope In: 9:06:08 AM Scope Out: 9:29:18 AM Scope Withdrawal Time: 0 hours 18 minutes 47 seconds  Total Procedure Duration: 0 hours 23 minutes 10 seconds  Findings:                 The perianal and digital rectal examinations were                            normal.                           Ten sessile polyps were found in the descending                            colon (1), transverse colon (2), ascending colon                            (5) and cecum (2). The polyps were 6 to 8 mm in                            size. These polyps were removed with a cold snare.  Resection and retrieval were complete.                           Two sessile polyps were found in the ascending                            colon. The polyps were 3 mm in size. These polyps                            were removed with a cold biopsy forceps. Resection                            and retrieval were complete.                           Multiple medium-mouthed diverticula were found in                            the left colon. There was no evidence of                            diverticular bleeding.                           The exam was otherwise without abnormality on                            direct and retroflexion views. Complications:            No immediate complications. Estimated blood loss:                            None. Estimated Blood Loss:     Estimated blood loss: none. Impression:               - Ten 6 to 8 mm polyps  in the descending colon, in                            the transverse colon, in the ascending colon and in                            the cecum, removed with a cold snare. Resected and                            retrieved.                           - Two 3 mm polyps in the ascending colon, removed                            with a cold biopsy forceps. Resected and retrieved.                           - Moderate diverticulosis in the left colon.                           -  The examination was otherwise normal on direct                            and retroflexion views. Recommendation:           - Repeat colonoscopy after studies are complete for                            surveillance based on pathology results.                           - Patient has a contact number available for                            emergencies. The signs and symptoms of potential                            delayed complications were discussed with the                            patient. Return to normal activities tomorrow.                            Written discharge instructions were provided to the                            patient.                           - High fiber diet.                           - Continue present medications.                           - Await pathology results. Ladene Artist, MD 01/14/2021 9:35:40 AM This report has been signed electronically.

## 2021-01-14 NOTE — Progress Notes (Signed)
VS by Ketchum  Pt's states no medical or surgical changes since previsit or office visit.  

## 2021-01-14 NOTE — Op Note (Signed)
[facility] Patient Name: Debbie Delacruz Procedure Date: 01/14/2021 8:47 AM MRN: [id number] Endoscopist: Ladene Artist , MD Age: 75 Referring MD:  Date of Birth: [birth year] Gender: Female Account #: [id number] Procedure:                Upper GI endoscopy Indications:              Surveillance for malignancy due to personal history                            of Barrett's esophagus Medicines:                Monitored Anesthesia Care Procedure:                Pre-Anesthesia Assessment:                           - Prior to the procedure, a History and Physical                            was performed, and patient medications and                            allergies were reviewed. The patient's tolerance of                            previous anesthesia was also reviewed. The risks                            and benefits of the procedure and the sedation                            options and risks were discussed with the patient.                            All questions were answered, and informed consent                            was obtained. Prior Anticoagulants: The patient has                            taken no previous anticoagulant or antiplatelet                            agents. ASA Grade Assessment: III - A patient with                            severe systemic disease. After reviewing the risks                            and benefits, the patient was deemed in                            satisfactory condition to undergo the procedure.  After obtaining informed consent, the endoscope was                            passed under direct vision. Throughout the                            procedure, the patient's blood pressure, pulse, and                            oxygen saturations were monitored continuously. The                            GIF [id number] [id number] was introduced through the                            mouth, and advanced to the  second part of duodenum.                            The upper GI endoscopy was accomplished without                            difficulty. The patient tolerated the procedure                            well. Scope In: Scope Out: Findings:                 There were esophageal mucosal changes secondary to                            established long-segment Barrett's disease present                            in the mid esophagus and in the distal esophagus.                            The maximum longitudinal extent of these mucosal                            changes was 10 cm in length. Mucosa was biopsied                            with a cold forceps for histology in 4 quadrants at                            intervals of 1.5 cm in the middle third of the                            esophagus and in the lower third of the esophagus.                            A total of 2 specimen bottles were sent to  pathology.                           The exam of the esophagus was otherwise normal.                           A medium-sized hiatal hernia was present.                           Multiple localized medium erosions with no bleeding                            and no stigmata of recent bleeding were found in                            the gastric body and in the gastric antrum.                            Biopsies were taken with a cold forceps for                            histology.                           The exam of the stomach was otherwise normal.                           A few localized erosions without bleeding were                            found in the duodenal bulb.                           The exam of the duodenum was otherwise normal. Complications:            No immediate complications. Estimated Blood Loss:     Estimated blood loss was minimal. Impression:               - Esophageal mucosal changes secondary to                             established long-segment Barrett's disease.                            Biopsied.                           - Medium-sized hiatal hernia.                           - Erosive gastropathy with no bleeding and no                            stigmata of recent bleeding. Biopsied.                           - Duodenal erosions  without bleeding. Recommendation:           - Patient has a contact number available for                            emergencies. The signs and symptoms of potential                            delayed complications were discussed with the                            patient. Return to normal activities tomorrow.                            Written discharge instructions were provided to the                            patient.                           - Resume previous diet.                           - Follow antireflux measures long term.                           - Continue present medications.                           - Pantoprazole 40 mg po qam long term, 1 year of                            refills.                           - Avoid NSAIDs, except ASA EC 81 mg qd is ok.                           - Await pathology results. Ladene Artist, MD 01/14/2021 10:00:37 AM This report has been signed electronically.

## 2021-01-16 ENCOUNTER — Telehealth: Payer: Self-pay

## 2021-01-16 NOTE — Telephone Encounter (Signed)
  Follow up Call-  Call back number 01/14/2021  Post procedure Call Back phone  # [phone]  Permission to leave phone message Yes  Some recent data might be hidden     Patient questions:  Do you have a fever, pain , or abdominal swelling? No. Pain Score  0 *  Have you tolerated food without any problems? Yes.    Have you been able to return to your normal activities? Yes.    Do you have any questions about your discharge instructions: Diet   No. Medications  No. Follow up visit  No.  Do you have questions or concerns about your Care? No.  Actions: * If pain score is 4 or above: No action needed, pain <4.  Have you developed a fever since your procedure? No   2.   Have you had an respiratory symptoms (SOB or cough) since your procedure? No   3.   Have you tested positive for COVID 19 since your procedure no   4.   Have you had any family members/close contacts diagnosed with the COVID 19 since your procedure?  No    If yes to any of these questions please route to Joylene John, RN and Joella Prince, RN

## 2021-01-20 ENCOUNTER — Telehealth: Payer: Self-pay | Admitting: Family Medicine

## 2021-01-20 DIAGNOSIS — N1832 Chronic kidney disease, stage 3b: Secondary | ICD-10-CM | POA: Diagnosis not present

## 2021-01-20 DIAGNOSIS — I10 Essential (primary) hypertension: Secondary | ICD-10-CM | POA: Diagnosis not present

## 2021-01-20 DIAGNOSIS — R809 Proteinuria, unspecified: Secondary | ICD-10-CM | POA: Diagnosis not present

## 2021-01-20 NOTE — Telephone Encounter (Signed)
Patient calling in. States she went to the pharmacy to get her pneumonia vaccine. States she was told that due to her age she needed a rx sent in for this.    Patient uses [facility].   Madelline Eshbach,cma

## 2021-01-20 NOTE — Telephone Encounter (Signed)
Patient calling in. States she went to the pharmacy to get her pneumonia vaccine. States she was told that due to her age she needed a rx sent in for this.   Patient uses [facility].

## 2021-01-21 MED ORDER — TETANUS-DIPHTHERIA TOXOIDS TD 5-2 LFU IM INJ: 0.5000 mL | INJECTION | Freq: Once | INTRAMUSCULAR | 0 refills | Status: DC

## 2021-01-21 MED ORDER — TETANUS-DIPHTHERIA TOXOIDS TD 5-2 LFU IM INJ: 0.5000 mL | INJECTION | Freq: Once | INTRAMUSCULAR | 0 refills | Status: AC

## 2021-01-21 NOTE — Telephone Encounter (Signed)
I think she may need the tetanus vaccine.  She received her pneumonia vaccine in the office.  I [facility] [place].

## 2021-01-22 NOTE — Telephone Encounter (Signed)
LVM for patient to call office.  Debbie Delacruz,cma

## 2021-01-22 NOTE — Telephone Encounter (Signed)
Patient called back and I informed her that she had the pneumonia vaccine here in office but she needed the tetanus and the prescription was sent to the pharmacy and she understood.  Brinlyn Cena,cma

## 2021-01-29 ENCOUNTER — Telehealth: Payer: Self-pay

## 2021-01-29 NOTE — Telephone Encounter (Signed)
A PA was done for repatha and it was approved from 05/25/2020-01/08/2022.  Pharmacy was notified.  Debbie Delacruz,cma

## 2021-01-31 ENCOUNTER — Encounter: Payer: Self-pay | Admitting: Gastroenterology

## 2021-02-06 ENCOUNTER — Encounter: Payer: Self-pay | Admitting: Family Medicine

## 2021-02-06 NOTE — Telephone Encounter (Signed)
I called and spoke with the pharmacist and gave a verbal to replace the patients Repatha sureclick pen.  Corine Solorio,cma

## 2021-02-11 ENCOUNTER — Other Ambulatory Visit: Payer: Self-pay

## 2021-02-11 DIAGNOSIS — D509 Iron deficiency anemia, unspecified: Secondary | ICD-10-CM

## 2021-02-12 ENCOUNTER — Inpatient Hospital Stay: Payer: Medicare Other

## 2021-02-12 ENCOUNTER — Inpatient Hospital Stay: Payer: Medicare Other | Attending: Oncology

## 2021-02-12 DIAGNOSIS — E785 Hyperlipidemia, unspecified: Secondary | ICD-10-CM | POA: Insufficient documentation

## 2021-02-12 DIAGNOSIS — K219 Gastro-esophageal reflux disease without esophagitis: Secondary | ICD-10-CM | POA: Diagnosis not present

## 2021-02-12 DIAGNOSIS — I129 Hypertensive chronic kidney disease with stage 1 through stage 4 chronic kidney disease, or unspecified chronic kidney disease: Secondary | ICD-10-CM | POA: Diagnosis not present

## 2021-02-12 DIAGNOSIS — Z85528 Personal history of other malignant neoplasm of kidney: Secondary | ICD-10-CM | POA: Diagnosis not present

## 2021-02-12 DIAGNOSIS — Z7982 Long term (current) use of aspirin: Secondary | ICD-10-CM | POA: Insufficient documentation

## 2021-02-12 DIAGNOSIS — K2270 Barrett's esophagus without dysplasia: Secondary | ICD-10-CM | POA: Diagnosis not present

## 2021-02-12 DIAGNOSIS — Z79899 Other long term (current) drug therapy: Secondary | ICD-10-CM | POA: Diagnosis not present

## 2021-02-12 DIAGNOSIS — D631 Anemia in chronic kidney disease: Secondary | ICD-10-CM | POA: Insufficient documentation

## 2021-02-12 DIAGNOSIS — E669 Obesity, unspecified: Secondary | ICD-10-CM | POA: Diagnosis not present

## 2021-02-12 DIAGNOSIS — I4891 Unspecified atrial fibrillation: Secondary | ICD-10-CM | POA: Diagnosis not present

## 2021-02-12 DIAGNOSIS — D509 Iron deficiency anemia, unspecified: Secondary | ICD-10-CM | POA: Insufficient documentation

## 2021-02-12 DIAGNOSIS — I2510 Atherosclerotic heart disease of native coronary artery without angina pectoris: Secondary | ICD-10-CM | POA: Diagnosis not present

## 2021-02-12 DIAGNOSIS — K30 Functional dyspepsia: Secondary | ICD-10-CM | POA: Diagnosis not present

## 2021-02-12 DIAGNOSIS — N183 Chronic kidney disease, stage 3 unspecified: Secondary | ICD-10-CM | POA: Insufficient documentation

## 2021-02-12 LAB — CBC WITH DIFFERENTIAL/PLATELET
Abs Immature Granulocytes: 0.01 10*3/uL (ref 0.00–0.07)
Basophils Absolute: 0 10*3/uL (ref 0.0–0.1)
Basophils Relative: 1 %
Eosinophils Absolute: 0.3 10*3/uL (ref 0.0–0.5)
Eosinophils Relative: 5 %
HCT: 39.7 % (ref 36.0–46.0)
Hemoglobin: 12.7 g/dL (ref 12.0–15.0)
Immature Granulocytes: 0 %
Lymphocytes Relative: 22 %
Lymphs Abs: 1.2 10*3/uL (ref 0.7–4.0)
MCH: 29.7 pg (ref 26.0–34.0)
MCHC: 32 g/dL (ref 30.0–36.0)
MCV: 92.8 fL (ref 80.0–100.0)
Monocytes Absolute: 0.7 10*3/uL (ref 0.1–1.0)
Monocytes Relative: 13 %
Neutro Abs: 3.2 10*3/uL (ref 1.7–7.7)
Neutrophils Relative %: 59 %
Platelets: 171 10*3/uL (ref 150–400)
RBC: 4.28 MIL/uL (ref 3.87–5.11)
RDW: 15.1 % (ref 11.5–15.5)
WBC: 5.4 10*3/uL (ref 4.0–10.5)
nRBC: 0 % (ref 0.0–0.2)

## 2021-02-12 LAB — IRON AND TIBC
Iron: 43 ug/dL (ref 28–170)
Saturation Ratios: 12 % (ref 10.4–31.8)
TIBC: 365 ug/dL (ref 250–450)
UIBC: 322 ug/dL

## 2021-02-12 LAB — FERRITIN: Ferritin: 16 ng/mL (ref 11–307)

## 2021-02-14 ENCOUNTER — Inpatient Hospital Stay: Payer: Medicare Other

## 2021-02-14 ENCOUNTER — Inpatient Hospital Stay (HOSPITAL_BASED_OUTPATIENT_CLINIC_OR_DEPARTMENT_OTHER): Payer: Medicare Other | Admitting: Oncology

## 2021-02-14 VITALS — BP 164/92 | HR 76 | Temp 98.3°F | Resp 16 | Wt 168.3 lb

## 2021-02-14 DIAGNOSIS — N183 Chronic kidney disease, stage 3 unspecified: Secondary | ICD-10-CM | POA: Diagnosis not present

## 2021-02-14 DIAGNOSIS — D509 Iron deficiency anemia, unspecified: Secondary | ICD-10-CM

## 2021-02-14 DIAGNOSIS — K2270 Barrett's esophagus without dysplasia: Secondary | ICD-10-CM | POA: Diagnosis not present

## 2021-02-14 DIAGNOSIS — I129 Hypertensive chronic kidney disease with stage 1 through stage 4 chronic kidney disease, or unspecified chronic kidney disease: Secondary | ICD-10-CM | POA: Diagnosis not present

## 2021-02-14 DIAGNOSIS — E785 Hyperlipidemia, unspecified: Secondary | ICD-10-CM | POA: Diagnosis not present

## 2021-02-14 DIAGNOSIS — D631 Anemia in chronic kidney disease: Secondary | ICD-10-CM | POA: Diagnosis not present

## 2021-02-14 MED ORDER — SODIUM CHLORIDE 0.9 % IV SOLN
510.0000 mg | Freq: Once | INTRAVENOUS | Status: AC
  Administered 2021-02-14: 510 mg via INTRAVENOUS
  Filled 2021-02-14: qty 510

## 2021-02-14 MED ORDER — SODIUM CHLORIDE 0.9 % IV SOLN
INTRAVENOUS | Status: DC
  Filled 2021-02-14: qty 250

## 2021-02-14 NOTE — Progress Notes (Signed)
[place]  [phone]) [phone] [phone]) [phone]  ID: Debbie Delacruz OB: 12/03/1945  MR#: [id number]  UXL#:[id number]  Patient Care Team: [name], MD as PCP - General (Family Medicine) [name], MD as PCP - Cardiology (Cardiology) [name], MD (Internal Medicine) [name], MD (Inactive) as Consulting Physician (Cardiothoracic Surgery) [name], MD as Consulting Physician (Cardiology) Lloyd Huger, MD as Consulting Physician (Oncology)  CHIEF COMPLAINT: Iron deficiency anemia, right renal mass.  INTERVAL HISTORY: [name] is a 75 year old female with past medical history significant for hypertension, CAD, GERD, CKD stage III, Barrett's esophagus, hyperlipidemia and obesity who is followed by Dr. Grayland Ormond for IDA.  She receives IV Feraheme intermittently last given on 11/14/2020 and 11/20/2020.  Today she returns to clinic to discuss possible IV iron.  Denies any interval hospitalizations.  REVIEW OF SYSTEMS:   Review of Systems  Constitutional: Negative.  Negative for fever, malaise/fatigue and weight loss.  Respiratory: Negative.  Negative for cough and shortness of breath.   Cardiovascular: Negative.  Negative for chest pain and leg swelling.  Gastrointestinal: Negative.  Negative for abdominal pain, blood in stool and melena.  Genitourinary: Negative.  Negative for hematuria.  Musculoskeletal: Negative.  Negative for joint pain.  Skin: Negative.  Negative for rash.  Neurological: Negative.  Negative for dizziness, sensory change, focal weakness, weakness and headaches.  Psychiatric/Behavioral: Negative.  The patient is not nervous/anxious and does not have insomnia.    As per HPI. Otherwise, a complete review of systems is negative.  PAST MEDICAL HISTORY: Past Medical History:  Diagnosis Date   A-fib ([place])    Anemia    Anginal pain ([place])    Anxiety    Arthritis    CAD (coronary  artery disease)    Cancer (HCC)    Carpal tunnel syndrome    bilateral   Cataract    bil removed cataracts   Coronary artery disease    2x stents, Dr. Clayborn Bigness   Depression    Dysrhythmia    Family history of adverse reaction to anesthesia    "brother; S/P lipotripsy in [place]; transferred to [facility]; had to put him on life support for 2 days"   GERD (gastroesophageal reflux disease)    Headache    "weekly" (09/13/2014)   History of blood transfusion 05/2014   "we haven't figured out why I needed it yet" (09/13/2014)   History of hiatal hernia    Hyperglycemia    Hyperlipidemia    Hypertension    Kidney stone    Myocardial infarct ([place])    Myocardial infarct, old 1999   15 years ago   Obesity    PONV (postoperative nausea and vomiting)    Renal mass    S/P CABG x 4 09/18/2014   LIMA to LAD, SVG to PDA-dRCA sequentially, SVG to OM, EVH via right thigh    Sleep apnea    Sleep apnea    "don't use a mask" (09/13/2014)   Unstable angina (HCC)     PAST SURGICAL HISTORY: Past Surgical History:  Procedure Laterality Date   ABDOMINAL HYSTERECTOMY     APPENDECTOMY     BACK SURGERY     BLADDER SURGERY     CARDIAC CATHETERIZATION  06/2014   CARDIAC CATHETERIZATION N/A 06/16/2016   Procedure: Left Heart Cath and Cors/Grafts Angiography;  Surgeon: Burnell Blanks, MD;  Location: [facility];  Service: Cardiovascular;  Laterality: N/A;   CARPAL TUNNEL  RELEASE Left    CATARACT EXTRACTION W/ INTRAOCULAR LENS  IMPLANT, BILATERAL     CHOLECYSTECTOMY     COLONOSCOPY     CORONARY ANGIOPLASTY WITH STENT PLACEMENT  1999   [facility] x2 stent   CORONARY ARTERY BYPASS GRAFT N/A 09/18/2014   Procedure: CORONARY ARTERY BYPASS GRAFTING (CABG)times four using LIMA  to LAD:SVG to  PD and DIST RCA;SVG to OM;, EVH Right thigh;  Surgeon: [name], MD;  Location: [facility];  Service: Open Heart Surgery;  Laterality: N/A;   CYSTOSCOPY W/ RETROGRADES Left 03/15/2020   Procedure:  CYSTOSCOPY WITH RETROGRADE PYELOGRAM;  Surgeon: Billey Co, MD;  Location: [facility];  Service: Urology;  Laterality: Left;   CYSTOSCOPY WITH STENT PLACEMENT Left 02/20/2020   Procedure: CYSTOSCOPY WITH STENT PLACEMENT;  Surgeon: Billey Co, MD;  Location: [facility];  Service: Urology;  Laterality: Left;   CYSTOSCOPY/URETEROSCOPY/HOLMIUM LASER/STENT PLACEMENT Left 03/15/2020   Procedure: CYSTOSCOPY/URETEROSCOPY;  Surgeon: Billey Co, MD;  Location: [facility];  Service: Urology;  Laterality: Left;   ESOPHAGOGASTRODUODENOSCOPY N/A 09/17/2014   Procedure: ESOPHAGOGASTRODUODENOSCOPY (EGD);  Surgeon: Inda Castle, MD;  Location: [facility];  Service: Endoscopy;  Laterality: N/A;   heart stents     [place] Right X 3   "last one was in the 1990's; still have hernia there now" (09/13/2014)   [place]  1990's?   LUMBAR SPINE SURGERY     NEPHRECTOMY Right    removal of ovaries     TEE WITHOUT CARDIOVERSION N/A 09/18/2014   Procedure: TRANSESOPHAGEAL ECHOCARDIOGRAM (TEE);  Surgeon: [name], MD;  Location: [facility];  Service: Open Heart Surgery;  Laterality: N/A;   UPPER GASTROINTESTINAL ENDOSCOPY      FAMILY HISTORY: Family History  Problem Relation Age of Onset   Coronary artery disease Mother    Osteoporosis Mother    Heart disease Mother    Stroke Mother    Heart attack Mother    Hypertension Mother    Hypertension Father    Coronary artery disease Father    COPD Father    Heart disease Father    Heart attack Father    Glaucoma Father    Osteoporosis Father    Emphysema Father    Cancer Sister 99       colon   Hypertension Sister    Colon cancer Sister        dx in her 27's   Breast cancer Sister    Colon polyps Sister    Stroke Brother    Hypertension Brother    Colon polyps Brother    Stroke Maternal Grandfather    Stroke Maternal  Grandmother    Osteoporosis Maternal Grandmother    Hypertension Maternal Grandmother    Hypertension Paternal Grandmother    Hypertension Paternal Grandfather    Breast cancer Maternal Aunt        2 aunts-50's   Esophageal cancer Neg Hx    Stomach cancer Neg Hx    Rectal cancer Neg Hx    Pancreatic cancer Neg Hx     ADVANCED DIRECTIVES (Y/N):  N  HEALTH MAINTENANCE: Social History   Tobacco Use   Smoking status: Never   Smokeless tobacco: Never  Vaping Use   Vaping Use: Never used  Substance Use Topics   Alcohol use: Not Currently  Alcohol/week: 0.0 standard drinks   Drug use: No     Colonoscopy:  PAP:  Bone density:  Lipid panel:  Allergies  Allergen Reactions   Ferrous Fumarate Other (See Comments)    STOMACH ISSUES STOMACH ISSUES STOMACH ISSUES   Lipitor [Atorvastatin] Other (See Comments)    myalgia    Current Outpatient Medications  Medication Sig Dispense Refill   acetaminophen (TYLENOL) 500 MG tablet Take 500 mg by mouth every 6 (six) hours as needed for headache.     amLODipine (NORVASC) 5 MG tablet TAKE 1 TABLET BY MOUTH ONCE DAILY 90 tablet 3   aspirin EC 81 MG tablet Take 1 tablet (81 mg total) by mouth daily.     Evolocumab (REPATHA SURECLICK) 209 MG/ML SOAJ Inject 140 mg into the skin every 14 (fourteen) days. 6 mL 1   ezetimibe (ZETIA) 10 MG tablet Take 1 tablet (10 mg total) by mouth daily. 90 tablet 3   isosorbide mononitrate (IMDUR) 30 MG 24 hr tablet TAKE 1 TABLET BY MOUTH ONCE DAILY 90 tablet 1   losartan (COZAAR) 25 MG tablet Take by mouth daily.     metoprolol succinate (TOPROL-XL) 50 MG 24 hr tablet TAKE 1 TABLET BY MOUTH ONCE DAILY 30 tablet 3   nitroGLYCERIN (NITROSTAT) 0.4 MG SL tablet Place 1 tablet (0.4 mg total) under the tongue every 5 (five) minutes as needed for chest pain. Take for up to 3 doses and then call 911 if still having chest pain. 50 tablet 3   ondansetron (ZOFRAN) 4 MG tablet Take 1 tablet (4 mg total) by mouth every  8 (eight) hours as needed for up to 10 doses for nausea or vomiting. 10 tablet 0   pantoprazole (PROTONIX) 40 MG tablet Take 1 tablet (40 mg total) by mouth daily. 30 tablet 11   rosuvastatin (CRESTOR) 40 MG tablet TAKE 1 TABLET BY MOUTH ONCE DAILY 90 tablet 1   No current facility-administered medications for this visit.    OBJECTIVE: There were no vitals filed for this visit.    There is no height or weight on file to calculate BMI.    ECOG FS:0 - Asymptomatic   Physical Exam Constitutional:      Appearance: Normal appearance.  HENT:     Head: Normocephalic and atraumatic.  Eyes:     Pupils: Pupils are equal, round, and reactive to light.  Cardiovascular:     Rate and Rhythm: Normal rate and regular rhythm.     Heart sounds: Normal heart sounds. No murmur heard. Pulmonary:     Effort: Pulmonary effort is normal.     Breath sounds: Normal breath sounds. No wheezing.  Abdominal:     General: Bowel sounds are normal. There is no distension.     Palpations: Abdomen is soft.     Tenderness: There is no abdominal tenderness.  Musculoskeletal:        General: Normal range of motion.     Cervical back: Normal range of motion.  Skin:    General: Skin is warm and dry.     Findings: No rash.  Neurological:     Mental Status: She is alert and oriented to person, place, and time.  Psychiatric:        Judgment: Judgment normal.     LAB RESULTS:  Lab Results  Component Value Date   NA 138 10/04/2020   K 3.6 10/04/2020   CL 110 10/04/2020   CO2 23 10/04/2020   GLUCOSE 133 (H) 10/04/2020  BUN 22 10/04/2020   CREATININE 1.15 (H) 10/04/2020   CALCIUM 7.9 (L) 10/04/2020   PROT 7.4 10/01/2020   ALBUMIN 4.2 10/01/2020   AST 20 10/01/2020   ALT 14 10/01/2020   ALKPHOS 55 10/01/2020   BILITOT 0.9 10/01/2020   GFRNONAA 51 (L) 10/04/2020   GFRAA >60 02/24/2020    Lab Results  Component Value Date   WBC 5.4 02/12/2021   NEUTROABS 3.2 02/12/2021   HGB 12.7 02/12/2021   HCT  39.7 02/12/2021   MCV 92.8 02/12/2021   PLT 171 02/12/2021   Lab Results  Component Value Date   IRON 43 02/12/2021   TIBC 365 02/12/2021   IRONPCTSAT 12 02/12/2021   Lab Results  Component Value Date   FERRITIN 16 02/12/2021     STUDIES: No results found.   ASSESSMENT: Iron deficiency anemia.  PLAN:    1. Iron deficiency anemia:  Unclear etiology.  Initial work-up included a endoscopy and colonoscopy and both were negative for GI bleed.  She is unable to tolerate oral iron secondary to GI upset.  Labs from 02/12/2021 show a hemoglobin of 12.7, ferritin 16 and iron saturation of 12%.  Proceed with 510 mg IV Feraheme today.  Return to clinic in 3 months with repeat labs, MD assessment and possible IV iron.  2.  Right renal cell carcinoma:  Patient underwent partial nephrectomy on 07/04/2020 at [facility] revealing stage I malignancy.  She follows up with [facility] every 6 months.  I spent 25 minutes dedicated to the care of this patient (face-to-face and non-face-to-face) on the date of the encounter to include what is described in the assessment and plan.  Patient expressed understanding and was in agreement with this plan. She also understands that She can call clinic at any time with any questions, concerns, or complaints.    Jacquelin Hawking, NP   02/14/2021 1:10 PM

## 2021-02-14 NOTE — Progress Notes (Signed)
Pt concerned about "protein in blood" that pcp was supposed to inform Dr. Grayland Ormond about. BP 164/92 at this time. Pt has no other concerns/complaints.

## 2021-02-17 ENCOUNTER — Other Ambulatory Visit: Payer: Medicare Other

## 2021-02-18 ENCOUNTER — Encounter: Payer: Self-pay | Admitting: Family Medicine

## 2021-02-18 MED ORDER — BLOOD PRESSURE KIT DEVI: 0 refills | Status: AC

## 2021-02-25 ENCOUNTER — Ambulatory Visit (INDEPENDENT_AMBULATORY_CARE_PROVIDER_SITE_OTHER): Payer: Medicare Other

## 2021-02-25 ENCOUNTER — Other Ambulatory Visit (INDEPENDENT_AMBULATORY_CARE_PROVIDER_SITE_OTHER): Payer: Medicare Other

## 2021-02-25 ENCOUNTER — Other Ambulatory Visit: Payer: Self-pay

## 2021-02-25 VITALS — Ht 62.0 in | Wt 168.0 lb

## 2021-02-25 DIAGNOSIS — E7800 Pure hypercholesterolemia, unspecified: Secondary | ICD-10-CM | POA: Diagnosis not present

## 2021-02-25 DIAGNOSIS — Z0000 Encounter for general adult medical examination without abnormal findings: Secondary | ICD-10-CM

## 2021-02-25 LAB — HEPATIC FUNCTION PANEL
ALT: 15 U/L (ref 0–35)
AST: 18 U/L (ref 0–37)
Albumin: 4.1 g/dL (ref 3.5–5.2)
Alkaline Phosphatase: 47 U/L (ref 39–117)
Bilirubin, Direct: 0.1 mg/dL (ref 0.0–0.3)
Total Bilirubin: 0.5 mg/dL (ref 0.2–1.2)
Total Protein: 6.5 g/dL (ref 6.0–8.3)

## 2021-02-25 LAB — LDL CHOLESTEROL, DIRECT: Direct LDL: 23 mg/dL

## 2021-02-25 NOTE — Patient Instructions (Addendum)
Ms. Gama , Thank you for taking time to come for your Medicare Wellness Visit. I appreciate your ongoing commitment to your health goals. Please review the following plan we discussed and let me know if I can assist you in the future.   These are the goals we discussed:  Goals      Mmaintain Healthy Lifestyle     Stay active Healthy diet        This is a list of the screening recommended for you and due dates:  Health Maintenance  Topic Date Due   COVID-19 Vaccine (4 - Booster for Pfizer series) 03/13/2021*   Zoster (Shingles) Vaccine (1 of 2) 05/28/2021*   Flu Shot  08/22/2021*   Mammogram  10/16/2021   Colon Cancer Screening  01/14/2022   Tetanus Vaccine  02/07/2031   DEXA scan (bone density measurement)  Completed   Hepatitis C Screening: USPSTF Recommendation to screen - Ages 39-79 yo.  Completed   HPV Vaccine  Aged Out  *Topic was postponed. The date shown is not the original due date.    Advanced directives: End of life planning; Advance aging; Advanced directives discussed.  Completed. Copy of current HCPOA/Living Will requested.    Conditions/risks identified: none new  Follow up in one year for your annual wellness visit    Preventive Care 65 Years and Older, Female Preventive care refers to lifestyle choices and visits with your health care provider that can promote health and wellness. What does preventive care include? A yearly physical exam. This is also called an annual well check. Dental exams once or twice a year. Routine eye exams. Ask your health care provider how often you should have your eyes checked. Personal lifestyle choices, including: Daily care of your teeth and gums. Regular physical activity. Eating a healthy diet. Avoiding tobacco and drug use. Limiting alcohol use. Practicing safe sex. Taking low-dose aspirin every day. Taking vitamin and mineral supplements as recommended by your health care provider. What happens during an  annual well check? The services and screenings done by your health care provider during your annual well check will depend on your age, overall health, lifestyle risk factors, and family history of disease. Counseling  Your health care provider may ask you questions about your: Alcohol use. Tobacco use. Drug use. Emotional well-being. Home and relationship well-being. Sexual activity. Eating habits. History of falls. Memory and ability to understand (cognition). Work and work Statistician. Reproductive health. Screening  You may have the following tests or measurements: Height, weight, and BMI. Blood pressure. Lipid and cholesterol levels. These may be checked every 5 years, or more frequently if you are over 51 years old. Skin check. Lung cancer screening. You may have this screening every year starting at age 20 if you have a 30-pack-year history of smoking and currently smoke or have quit within the past 15 years. Fecal occult blood test (FOBT) of the stool. You may have this test every year starting at age 23. Flexible sigmoidoscopy or colonoscopy. You may have a sigmoidoscopy every 5 years or a colonoscopy every 10 years starting at age 78. Hepatitis C blood test. Hepatitis B blood test. Sexually transmitted disease (STD) testing. Diabetes screening. This is done by checking your blood sugar (glucose) after you have not eaten for a while (fasting). You may have this done every 1-3 years. Bone density scan. This is done to screen for osteoporosis. You may have this done starting at age 36. Mammogram. This may be done every 1-2  years. Talk to your health care provider about how often you should have regular mammograms. Talk with your health care provider about your test results, treatment options, and if necessary, the need for more tests. Vaccines  Your health care provider may recommend certain vaccines, such as: Influenza vaccine. This is recommended every year. Tetanus,  diphtheria, and acellular pertussis (Tdap, Td) vaccine. You may need a Td booster every 10 years. Zoster vaccine. You may need this after age 41. Pneumococcal 13-valent conjugate (PCV13) vaccine. One dose is recommended after age 83. Pneumococcal polysaccharide (PPSV23) vaccine. One dose is recommended after age 37. Talk to your health care provider about which screenings and vaccines you need and how often you need them. This information is not intended to replace advice given to you by your health care provider. Make sure you discuss any questions you have with your health care provider. Document Released: 06/07/2015 Document Revised: 01/29/2016 Document Reviewed: 03/12/2015 Elsevier Interactive Patient Education  2017 [place] Prevention in the Home Falls can cause injuries. They can happen to people of all ages. There are many things you can do to make your home safe and to help prevent falls. What can I do on the outside of my home? Regularly fix the edges of walkways and driveways and fix any cracks. Remove anything that might make you trip as you walk through a door, such as a raised step or threshold. Trim any bushes or trees on the path to your home. Use bright outdoor lighting. Clear any walking paths of anything that might make someone trip, such as rocks or tools. Regularly check to see if handrails are loose or broken. Make sure that both sides of any steps have handrails. Any raised decks and porches should have guardrails on the edges. Have any leaves, snow, or ice cleared regularly. Use sand or salt on walking paths during winter. Clean up any spills in your garage right away. This includes oil or grease spills. What can I do in the bathroom? Use night lights. Install grab bars by the toilet and in the tub and shower. Do not use towel bars as grab bars. Use non-skid mats or decals in the tub or shower. If you need to sit down in the shower, use a plastic,  non-slip stool. Keep the floor dry. Clean up any water that spills on the floor as soon as it happens. Remove soap buildup in the tub or shower regularly. Attach bath mats securely with double-sided non-slip rug tape. Do not have throw rugs and other things on the floor that can make you trip. What can I do in the bedroom? Use night lights. Make sure that you have a light by your bed that is easy to reach. Do not use any sheets or blankets that are too big for your bed. They should not hang down onto the floor. Have a firm chair that has side arms. You can use this for support while you get dressed. Do not have throw rugs and other things on the floor that can make you trip. What can I do in the kitchen? Clean up any spills right away. Avoid walking on wet floors. Keep items that you use a lot in easy-to-reach places. If you need to reach something above you, use a strong step stool that has a grab bar. Keep electrical cords out of the way. Do not use floor polish or wax that makes floors slippery. If you must use wax, use non-skid floor  wax. Do not have throw rugs and other things on the floor that can make you trip. What can I do with my stairs? Do not leave any items on the stairs. Make sure that there are handrails on both sides of the stairs and use them. Fix handrails that are broken or loose. Make sure that handrails are as long as the stairways. Check any carpeting to make sure that it is firmly attached to the stairs. Fix any carpet that is loose or worn. Avoid having throw rugs at the top or bottom of the stairs. If you do have throw rugs, attach them to the floor with carpet tape. Make sure that you have a light switch at the top of the stairs and the bottom of the stairs. If you do not have them, ask someone to add them for you. What else can I do to help prevent falls? Wear shoes that: Do not have high heels. Have rubber bottoms. Are comfortable and fit you well. Are closed  at the toe. Do not wear sandals. If you use a stepladder: Make sure that it is fully opened. Do not climb a closed stepladder. Make sure that both sides of the stepladder are locked into place. Ask someone to hold it for you, if possible. Clearly mark and make sure that you can see: Any grab bars or handrails. First and last steps. Where the edge of each step is. Use tools that help you move around (mobility aids) if they are needed. These include: Canes. Walkers. Scooters. Crutches. Turn on the lights when you go into a dark area. Replace any light bulbs as soon as they burn out. Set up your furniture so you have a clear path. Avoid moving your furniture around. If any of your floors are uneven, fix them. If there are any pets around you, be aware of where they are. Review your medicines with your doctor. Some medicines can make you feel dizzy. This can increase your chance of falling. Ask your doctor what other things that you can do to help prevent falls. This information is not intended to replace advice given to you by your health care provider. Make sure you discuss any questions you have with your health care provider. Document Released: 03/07/2009 Document Revised: 10/17/2015 Document Reviewed: 06/15/2014 Elsevier Interactive Patient Education  2017 [employer].

## 2021-02-25 NOTE — Progress Notes (Signed)
Subjective:   Debbie Delacruz is a 75 y.o. female who presents for an Initial Medicare Annual Wellness Visit.  Review of Systems    No ROS.  Medicare Wellness Virtual Visit.  Visual/audio telehealth visit, UTA vital signs.   See social history for additional risk factors.   Cardiac Risk Factors include: advanced age (>54men, >38 women);hypertension     Objective:    Today's Vitals   02/25/21 0834  Weight: 168 lb (76.2 kg)  Height: 5\' 2"  (1.575 m)   Body mass index is 30.73 kg/m.  Advanced Directives 02/25/2021 02/14/2021 10/01/2020 03/15/2020 03/12/2020 02/20/2020 02/19/2020  Does Patient Have a Medical Advance Directive? Yes Yes No Yes Yes Yes Yes  Type of 02/21/2020 of [place];Living will Healthcare Power of [place];Living will - Healthcare Power of [place];Living will - Healthcare Power of [place];Living will Healthcare Power of [place];Living will  Does patient want to make changes to medical advance directive? - No - Patient declined - No - Patient declined - No - Patient declined -  Copy of Healthcare Power of Attorney in Chart? No - copy requested No - copy requested - No - copy requested - No - copy requested No - copy requested  Would patient like information on creating a medical advance directive? - No - Patient declined No - Patient declined No - Patient declined - No - Patient declined No - Patient declined  Pre-existing out of facility DNR order (yellow form or pink MOST form) - - - - - - -    Current Medications (verified) Outpatient Encounter Medications as of 02/25/2021  Medication Sig   acetaminophen (TYLENOL) 500 MG tablet Take 500 mg by mouth every 6 (six) hours as needed for headache.   amLODipine (NORVASC) 5 MG tablet TAKE 1 TABLET BY MOUTH ONCE DAILY   aspirin EC 81 MG tablet Take 1 tablet (81 mg total) by mouth daily.   Blood Pressure Monitoring (BLOOD PRESSURE KIT) DEVI Check blood pressure daily, brand at patient and  insurance preference, dx code I10   Evolocumab (REPATHA SURECLICK) 140 MG/ML SOAJ Inject 140 mg into the skin every 14 (fourteen) days.   ezetimibe (ZETIA) 10 MG tablet Take 1 tablet (10 mg total) by mouth daily.   isosorbide mononitrate (IMDUR) 30 MG 24 hr tablet TAKE 1 TABLET BY MOUTH ONCE DAILY   losartan (COZAAR) 25 MG tablet Take by mouth daily.   metoprolol succinate (TOPROL-XL) 50 MG 24 hr tablet TAKE 1 TABLET BY MOUTH ONCE DAILY   nitroGLYCERIN (NITROSTAT) 0.4 MG SL tablet Place 1 tablet (0.4 mg total) under the tongue every 5 (five) minutes as needed for chest pain. Take for up to 3 doses and then call 911 if still having chest pain.   ondansetron (ZOFRAN) 4 MG tablet Take 1 tablet (4 mg total) by mouth every 8 (eight) hours as needed for up to 10 doses for nausea or vomiting.   pantoprazole (PROTONIX) 40 MG tablet Take 1 tablet (40 mg total) by mouth daily.   rosuvastatin (CRESTOR) 40 MG tablet TAKE 1 TABLET BY MOUTH ONCE DAILY   No facility-administered encounter medications on file as of 02/25/2021.    Allergies (verified) Ferrous fumarate and Lipitor [atorvastatin]   History: Past Medical History:  Diagnosis Date   A-fib (HCC)    Anemia    Anginal pain (HCC)    Anxiety    Arthritis    CAD (coronary artery disease)    Cancer (HCC)    Carpal  tunnel syndrome    bilateral   Cataract    bil removed cataracts   Coronary artery disease    2x stents, Dr. Clayborn Bigness   Depression    Dysrhythmia    Family history of adverse reaction to anesthesia    "brother; S/P lipotripsy in [place]; transferred to [facility]; had to put him on life support for 2 days"   GERD (gastroesophageal reflux disease)    Headache    "weekly" (09/13/2014)   History of blood transfusion 05/2014   "we haven't figured out why I needed it yet" (09/13/2014)   History of hiatal hernia    Hyperglycemia    Hyperlipidemia    Hypertension    Kidney stone    Myocardial infarct ([place])    Myocardial  infarct, old 1999   15 years ago   Obesity    PONV (postoperative nausea and vomiting)    Renal mass    S/P CABG x 4 09/18/2014   LIMA to LAD, SVG to PDA-dRCA sequentially, SVG to OM, EVH via right thigh    Sleep apnea    Sleep apnea    "don't use a mask" (09/13/2014)   Unstable angina [facility])    Past Surgical History:  Procedure Laterality Date   ABDOMINAL HYSTERECTOMY     APPENDECTOMY     BACK SURGERY     BLADDER SURGERY     CARDIAC CATHETERIZATION  06/2014   CARDIAC CATHETERIZATION N/A 06/16/2016   Procedure: Left Heart Cath and Cors/Grafts Angiography;  Surgeon: Burnell Blanks, MD;  Location: [place] [facility];  Service: Cardiovascular;  Laterality: N/A;   CARPAL TUNNEL RELEASE Left    CATARACT EXTRACTION W/ INTRAOCULAR LENS  IMPLANT, BILATERAL     CHOLECYSTECTOMY     COLONOSCOPY     CORONARY ANGIOPLASTY WITH STENT PLACEMENT  1999   [facility] x2 stent   CORONARY ARTERY BYPASS GRAFT N/A 09/18/2014   Procedure: CORONARY ARTERY BYPASS GRAFTING (CABG)times four using LIMA  to LAD:SVG to  PD and DIST RCA;SVG to OM;, EVH Right thigh;  Surgeon: [name], MD;  Location: [place];  Service: Open Heart Surgery;  Laterality: N/A;   CYSTOSCOPY W/ RETROGRADES Left 03/15/2020   Procedure: CYSTOSCOPY WITH RETROGRADE PYELOGRAM;  Surgeon: Billey Co, MD;  Location: [facility];  Service: Urology;  Laterality: Left;   CYSTOSCOPY WITH STENT PLACEMENT Left 02/20/2020   Procedure: CYSTOSCOPY WITH STENT PLACEMENT;  Surgeon: Billey Co, MD;  Location: [facility];  Service: Urology;  Laterality: Left;   CYSTOSCOPY/URETEROSCOPY/HOLMIUM LASER/STENT PLACEMENT Left 03/15/2020   Procedure: CYSTOSCOPY/URETEROSCOPY;  Surgeon: Billey Co, MD;  Location: [facility];  Service: Urology;  Laterality: Left;   ESOPHAGOGASTRODUODENOSCOPY N/A 09/17/2014   Procedure: ESOPHAGOGASTRODUODENOSCOPY (EGD);  Surgeon: Inda Castle, MD;  Location: [place];  Service: Endoscopy;  Laterality: N/A;   heart  stents     [place] Right X 3   "last one was in the 1990's; still have hernia there now" (09/13/2014)   [place]  1990's?   LUMBAR SPINE SURGERY     NEPHRECTOMY Right    removal of ovaries     TEE WITHOUT CARDIOVERSION N/A 09/18/2014   Procedure: TRANSESOPHAGEAL ECHOCARDIOGRAM (TEE);  Surgeon: [name], MD;  Location: [place];  Service: Open Heart Surgery;  Laterality: N/A;   UPPER GASTROINTESTINAL ENDOSCOPY  Family History  Problem Relation Age of Onset   Coronary artery disease Mother    Osteoporosis Mother    Heart disease Mother    Stroke Mother    Heart attack Mother    Hypertension Mother    Hypertension Father    Coronary artery disease Father    COPD Father    Heart disease Father    Heart attack Father    Glaucoma Father    Osteoporosis Father    Emphysema Father    Cancer Sister 71       colon   Hypertension Sister    Colon cancer Sister        dx in her 57's   Breast cancer Sister    Colon polyps Sister    Stroke Brother    Hypertension Brother    Colon polyps Brother    Stroke Maternal Grandfather    Stroke Maternal Grandmother    Osteoporosis Maternal Grandmother    Hypertension Maternal Grandmother    Hypertension Paternal Grandmother    Hypertension Paternal Grandfather    Breast cancer Maternal Aunt        2 aunts-50's   Esophageal cancer Neg Hx    Stomach cancer Neg Hx    Rectal cancer Neg Hx    Pancreatic cancer Neg Hx    Social History   Socioeconomic History   Marital status: Married    Spouse name: Not on file   Number of children: Not on file   Years of education: Not on file   Highest education level: Not on file  Occupational History   Not on file  Tobacco Use   Smoking status: Never   Smokeless tobacco: Never  Vaping Use   Vaping Use: Never used  Substance and Sexual Activity   Alcohol use: Not  Currently    Alcohol/week: 0.0 standard drinks   Drug use: No   Sexual activity: Not Currently    Birth control/protection: Post-menopausal  Other Topics Concern   Not on file  Social History Narrative   ** Merged History Encounter **       Lives in [place] with 57 YO nephew and husband. 3 dogs outside.  Work - Doctor, general practice, [employer]  Diet - regular, limited meat  Exercise - walks some   Social Determinants of Radio broadcast assistant Strain: Low Risk    Difficulty of Paying Living Expenses: Not hard at all  Food Insecurity: No Food Insecurity   Worried About Charity fundraiser in the Last Year: Never true   Arboriculturist in the Last Year: Never true  Transportation Needs: No Transportation Needs   Lack of Transportation (Medical): No   Lack of Transportation (Non-Medical): No  Physical Activity: Not on file  Stress: No Stress Concern Present   Feeling of Stress : Not at all  Social Connections: Unknown   Frequency of Communication with Friends and Family: More than three times a week   Frequency of Social Gatherings with Friends and Family: Not on file   Attends Religious Services: Not on file   Active Member of Clubs or Organizations: Not on file   Attends Archivist Meetings: Not on file   Marital Status: Not on file    Tobacco Counseling Counseling given: Not Answered   Clinical Intake:  Pre-visit preparation completed: Yes        Diabetes: No  How often do you need to have someone help you when you read  instructions, pamphlets, or other written materials from your doctor or pharmacy?: 1 - Never    Interpreter Needed?: No      Activities of Daily Living In your present state of health, do you have any difficulty performing the following activities: 02/25/2021 10/01/2020  Hearing? N N  Vision? N N  Difficulty concentrating or making decisions? N N  Walking or climbing stairs? N N  Dressing or bathing? N N  Doing  errands, shopping? N N  Preparing Food and eating ? N -  Using the Toilet? N -  In the past six months, have you accidently leaked urine? N -  Do you have problems with loss of bowel control? N -  Managing your Medications? N -  Managing your Finances? N -  Housekeeping or managing your Housekeeping? N -  Some recent data might be hidden    Patient Care Team: [name], MD as PCP - General (Family Medicine) [name], MD as PCP - Cardiology (Cardiology) [name], MD (Internal Medicine) [name], MD (Inactive) as Consulting Physician (Cardiothoracic Surgery) [name], MD as Consulting Physician (Cardiology) Lloyd Huger, MD as Consulting Physician (Oncology)  Indicate any recent Medical Services you may have received from other than [facility] providers in the past year (date may be approximate).     Assessment:   This is a routine wellness examination for [facility].  I connected with Shameika today by telephone and verified that I am speaking with the correct person using two identifiers. Location patient: home Location provider: work Persons participating in the virtual visit: patient, Marine scientist.    I discussed the limitations, risks, security and privacy concerns of performing an evaluation and management service by telephone and the availability of in person appointments. The patient expressed understanding and verbally consented to this telephonic visit.    Interactive audio and video telecommunications were attempted between this provider and patient, however failed, due to patient having technical difficulties OR patient did not have access to video capability.  We continued and completed visit with audio only.  Some vital signs may be absent or patient reported.   Hearing/Vision screen Hearing Screening - Comments:: Patient is able to hear conversational tones without difficulty.  No issues reported.  Vision Screening - Comments:: Wears  corrective lenses They have seen their ophthalmologist in the last 12 months.    Dietary issues and exercise activities discussed: Current Exercise Habits: Home exercise routine, Type of exercise: walking, Intensity: Mild   Goals Addressed             This Visit's Progress    Mmaintain Healthy Lifestyle       Stay active Healthy diet       Depression Screen PHQ 2/9 Scores 02/25/2021 01/01/2021 05/07/2020 01/31/2020 07/24/2019 11/22/2015 10/09/2015  PHQ - 2 Score 0 0 0 0 0 0 0    Fall Risk Fall Risk  02/25/2021 01/01/2021 01/31/2020 10/06/2016 11/22/2015  Falls in the past year? 0 0 0 Yes No  Number falls in past yr: 0 0 0 2 or more -  Injury with Fall? - - - No -  Risk Factor Category  - - - High Fall Risk -  Risk for fall due to : - - - Other (Comment) -  Risk for fall due to: Comment - - - fainting -  Follow up Falls evaluation completed Falls evaluation completed Falls evaluation completed Education provided -    FALL RISK PREVENTION PERTAINING TO THE  HOME: Adequate lighting in your home to reduce risk of falls? Yes   ASSISTIVE DEVICES UTILIZED TO PREVENT FALLS: Use of a cane, walker or w/c? No   TIMED UP AND GO: Was the test performed? No .   Cognitive Function:  Patient is alert and oriented x3.  Denies difficulty focusing, making decisions, memory loss.  MMSE/6CIT deferred. Normal by direct communication/observation.       Immunizations Immunization History  Administered Date(s) Administered   Fluad Quad(high Dose 65+) 02/17/2019   Influenza Whole 04/30/2006   Influenza,inj,Quad PF,6+ Mos 03/28/2013, 03/09/2015   Influenza,inj,quad, With Preservative 03/25/2020   Influenza-Unspecified 03/14/2012, 05/04/2020   PFIZER(Purple Top)SARS-COV-2 Vaccination 07/16/2019, 08/09/2019, 05/04/2020   PNEUMOCOCCAL CONJUGATE-20 01/01/2021   Pneumococcal Polysaccharide-23 03/28/2013   Td 04/30/2006   Tdap 02/06/2021   Zoster, Live 05/09/2013   Influenza vaccine- plans to  receive at later date. Agrees to update immunization record.   Covid vaccine- plans to receive second booster at later date. Agrees to update immunization record.   Shingrix vaccine- patient understands to receive at local pharmacy due to insurance coverage. Agrees to update immunization record upon completion. Consent given to fax [phone] Rx to [place], [place] Maintenance  Topic Date Due   COVID-19 Vaccine (4 - Booster for [place] series) 03/13/2021 (Originally 07/27/2020)   Zoster Vaccines- Shingrix (1 of 2) 05/28/2021 (Originally 11/17/1964)   INFLUENZA VACCINE  08/22/2021 (Originally 12/23/2020)   MAMMOGRAM  10/16/2021   COLONOSCOPY (Pts 45-72yrs Insurance coverage will need to be confirmed)  01/14/2022   TETANUS/TDAP  02/07/2031   DEXA SCAN  Completed   Hepatitis C Screening  Completed   HPV VACCINES  Aged Out   Mammogram- ordered 08/2020. Plans to schedule.   Lung Cancer Screening: (Low Dose CT Chest recommended if Age 67-80 years, 30 pack-year currently smoking OR have quit w/in 15years.) does not qualify.   Vision Screening: Recommended annual ophthalmology exams for early detection of glaucoma and other disorders of the eye.  Dental Screening: Recommended annual dental exams for proper oral hygiene  Community Resource Referral / Chronic Care Management: CRR required this visit?  No   CCM required this visit?  No      Plan:   Keep all routine maintenance appointments.   I have personally reviewed and noted the following in the patient's chart:   Medical and social history Use of alcohol, tobacco or illicit drugs  Current medications and supplements including opioid prescriptions. Patient is not currently taking opioid prescriptions. Functional ability and status Nutritional status Physical activity Advanced directives List of other physicians Hospitalizations, surgeries, and ER visits in previous 12 months Vitals Screenings to  include cognitive, depression, and falls Referrals and appointments  In addition, I have reviewed and discussed with patient certain preventive protocols, quality metrics, and best practice recommendations. A written personalized care plan for preventive services as well as general preventive health recommendations were provided to patient via mychart.     Varney Biles, LPN   [id number]

## 2021-02-26 ENCOUNTER — Ambulatory Visit: Payer: Medicare Other | Admitting: Urology

## 2021-03-06 ENCOUNTER — Encounter: Payer: Self-pay | Admitting: Urology

## 2021-03-06 ENCOUNTER — Ambulatory Visit
Admission: RE | Admit: 2021-03-06 | Discharge: 2021-03-06 | Disposition: A | Discharge status: Home / Self Care | Payer: Medicare Other | Source: Ambulatory Visit | Attending: Urology | Admitting: Urology

## 2021-03-06 ENCOUNTER — Ambulatory Visit (INDEPENDENT_AMBULATORY_CARE_PROVIDER_SITE_OTHER): Payer: Medicare Other | Admitting: Urology

## 2021-03-06 ENCOUNTER — Other Ambulatory Visit: Payer: Self-pay

## 2021-03-06 VITALS — BP 193/91 | HR 83 | Ht 62.0 in | Wt 168.0 lb

## 2021-03-06 DIAGNOSIS — Z87442 Personal history of urinary calculi: Secondary | ICD-10-CM | POA: Diagnosis not present

## 2021-03-06 DIAGNOSIS — N200 Calculus of kidney: Secondary | ICD-10-CM

## 2021-03-06 DIAGNOSIS — Z9889 Other specified postprocedural states: Secondary | ICD-10-CM | POA: Diagnosis not present

## 2021-03-06 DIAGNOSIS — I878 Other specified disorders of veins: Secondary | ICD-10-CM | POA: Diagnosis not present

## 2021-03-06 DIAGNOSIS — C641 Malignant neoplasm of right kidney, except renal pelvis: Secondary | ICD-10-CM

## 2021-03-06 NOTE — Patient Instructions (Signed)
Dietary Guidelines to Help Prevent Kidney Stones Kidney stones are deposits of minerals and salts that form inside your kidneys. Your risk of developing kidney stones may be greater depending on your diet, your lifestyle, the medicines you take, and whether you have certain medical conditions. Most people can lower their chances of developing kidney stones by following the instructions below. Your dietitian may give you more specific instructions depending on your overall health and the type of kidney stones you tend to develop. What are tips for following this plan? Reading food labels  Choose foods with "no salt added" or "low-salt" labels. Limit your salt (sodium) intake to less than 1,500 mg a day. Choose foods with calcium for each meal and snack. Try to eat about 300 mg of calcium at each meal. Foods that contain 200-500 mg of calcium a serving include: 8 oz (237 mL) of milk, calcium-fortifiednon-dairy milk, and calcium-fortifiedfruit juice. Calcium-fortified means that calcium has been added to these drinks. 8 oz (237 mL) of kefir, yogurt, and soy yogurt. 4 oz (114 g) of tofu. 1 oz (28 g) of cheese. 1 cup (150 g) of dried figs. 1 cup (91 g) of cooked broccoli. One 3 oz (85 g) can of sardines or mackerel. Most people need 1,000-1,500 mg of calcium a day. Talk to your dietitian about how much calcium is recommended for you. Shopping Buy plenty of fresh fruits and vegetables. Most people do not need to avoid fruits and vegetables, even if these foods contain nutrients that may contribute to kidney stones. When shopping for convenience foods, choose: Whole pieces of fruit. Pre-made salads with dressing on the side. Low-fat fruit and yogurt smoothies. Avoid buying frozen meals or prepared deli foods. These can be high in sodium. Look for foods with live cultures, such as yogurt and kefir. Choose high-fiber grains, such as whole-wheat breads, oat bran, and wheat cereals. Cooking Do not add  salt to food when cooking. Place a salt shaker on the table and allow each person to add his or her own salt to taste. Use vegetable protein, such as beans, textured vegetable protein (TVP), or tofu, instead of meat in pasta, casseroles, and soups. Meal planning Eat less salt, if told by your dietitian. To do this: Avoid eating processed or pre-made food. Avoid eating fast food. Eat less animal protein, including cheese, meat, poultry, or fish, if told by your dietitian. To do this: Limit the number of times you have meat, poultry, fish, or cheese each week. Eat a diet free of meat at least 2 days a week. Eat only one serving each day of meat, poultry, fish, or seafood. When you prepare animal protein, cut pieces into small portion sizes. For most meat and fish, one serving is about the size of the palm of your hand. Eat at least five servings of fresh fruits and vegetables each day. To do this: Keep fruits and vegetables on hand for snacks. Eat one piece of fruit or a handful of berries with breakfast. Have a salad and fruit at lunch. Have two kinds of vegetables at dinner. Limit foods that are high in a substance called oxalate. These include: Spinach (cooked), rhubarb, beets, sweet potatoes, and Swiss chard. Peanuts. Potato chips, french fries, and baked potatoes with skin on. Nuts and nut products. Chocolate. If you regularly take a diuretic medicine, make sure to eat at least 1 or 2 servings of fruits or vegetables that are high in potassium each day. These include: Avocado. Banana. Orange, prune,   carrot, or tomato juice. Baked potato. Cabbage. Beans and split peas. Lifestyle  Drink enough fluid to keep your urine pale yellow. This is the most important thing you can do. Spread your fluid intake throughout the day. If you drink alcohol: Limit how much you use to: 0-1 drink a day for women who are not pregnant. 0-2 drinks a day for men. Be aware of how much alcohol is in your  drink. In the [place] one drink equals one 12 oz bottle of beer (355 mL), one 5 oz glass of wine (148 mL), or one 1 oz glass of hard liquor (44 mL). Lose weight if told by your health care provider. Work with your dietitian to find an eating plan and weight loss strategies that work best for you. General information Talk to your health care provider and dietitian about taking daily supplements. You may be told the following depending on your health and the cause of your kidney stones: Not to take supplements with vitamin C. To take a calcium supplement. To take a daily probiotic supplement. To take other supplements such as magnesium, fish oil, or vitamin B6. Take over-the-counter and prescription medicines only as told by your health care provider. These include supplements. What foods should I limit? Limit your intake of the following foods, or eat them as told by your dietitian. Vegetables Spinach. Rhubarb. Beets. Canned vegetables. Pickles. Olives. Baked potatoes with skin. Grains Wheat bran. Baked goods. Salted crackers. Cereals high in sugar. Meats and other proteins Nuts. Nut butters. Large portions of meat, poultry, or fish. Salted, precooked, or cured meats, such as sausages, meat loaves, and hot dogs. Dairy Cheese. Beverages Regular soft drinks. Regular vegetable juice. Seasonings and condiments Seasoning blends with salt. Salad dressings. Soy sauce. Ketchup. Barbecue sauce. Other foods Canned soups. Canned pasta sauce. Casseroles. Pizza. Lasagna. Frozen meals. Potato chips. French fries. The items listed above may not be a complete list of foods and beverages you should limit. Contact a dietitian for more information. What foods should I avoid? Talk to your dietitian about specific foods you should avoid based on the type of kidney stones you have and your overall health. Fruits Grapefruit. The item listed above may not be a complete list of foods and beverages you should  avoid. Contact a dietitian for more information. Summary Kidney stones are deposits of minerals and salts that form inside your kidneys. You can lower your risk of kidney stones by making changes to your diet. The most important thing you can do is drink enough fluid. Drink enough fluid to keep your urine pale yellow. Talk to your dietitian about how much calcium you should have each day, and eat less salt and animal protein as told by your dietitian. This information is not intended to replace advice given to you by your health care provider. Make sure you discuss any questions you have with your health care provider. Document Revised: 05/04/2019 Document Reviewed: 05/04/2019 Elsevier Patient Education  2022 Elsevier Inc.  

## 2021-03-06 NOTE — Progress Notes (Signed)
   03/06/2021 2:57 PM   Debbie Delacruz 12-09-1945 [id number]  Reason for visit: Follow up nephrolithiasis, kidney cancer  HPI: 75 year old female who underwent urgent left ureteral stent placement in September 2021 for an infected left ureteral stone, with follow-up ureteroscopy in October 2021.  At that time she also was found to have a 4 cm central right renal mass, and ultimately underwent a radical right nephrectomy at [facility] with Dr. Lottie Rater in February 2022 with final path showing pT1a 3.9 cm clear-cell RCC, grade 3, with negative margins.  She is getting surveillance imaging with them, and is due for MRI and chest x-ray next month.  She denies any problems since I saw her last, specifically no gross hematuria or kidney stones.  I personally reviewed and interpreted her CT from May 2022 as well as her KUB from today that shows a solitary left kidney with no evidence of nephrolithiasis.  We discussed general stone prevention strategies including adequate hydration with goal of producing 2.5 L of urine daily, increasing citric acid intake, increasing calcium intake during high oxalate meals, minimizing animal protein, and decreasing salt intake. Information about dietary recommendations given today.   RTC 1 year KUB Continue surveillance follow-up with [facility] regarding history of right RCC status post radical nephrectomy   Billey Co, MD  [facility] [address], [place], [place] [address] [phone]

## 2021-03-18 DIAGNOSIS — Z23 Encounter for immunization: Secondary | ICD-10-CM | POA: Diagnosis not present

## 2021-05-06 ENCOUNTER — Other Ambulatory Visit: Payer: Self-pay

## 2021-05-06 ENCOUNTER — Telehealth (INDEPENDENT_AMBULATORY_CARE_PROVIDER_SITE_OTHER): Payer: Medicare Other | Admitting: Adult Health

## 2021-05-06 ENCOUNTER — Encounter: Payer: Self-pay | Admitting: Adult Health

## 2021-05-06 VITALS — BP 132/87 | Temp 99.5°F | Ht 62.5 in | Wt 165.2 lb

## 2021-05-06 DIAGNOSIS — A499 Bacterial infection, unspecified: Secondary | ICD-10-CM | POA: Diagnosis not present

## 2021-05-06 DIAGNOSIS — R051 Acute cough: Secondary | ICD-10-CM

## 2021-05-06 DIAGNOSIS — Z8709 Personal history of other diseases of the respiratory system: Secondary | ICD-10-CM

## 2021-05-06 MED ORDER — PREDNISONE 10 MG (21) PO TBPK: ORAL_TABLET | ORAL | 0 refills | Status: DC

## 2021-05-06 MED ORDER — AZITHROMYCIN 250 MG PO TABS: ORAL_TABLET | ORAL | 0 refills | Status: AC

## 2021-05-06 MED ORDER — GUAIFENESIN-CODEINE 100-10 MG/5ML PO SYRP: 5.0000 mL | ORAL_SOLUTION | Freq: Every evening | ORAL | 0 refills | Status: DC | PRN

## 2021-05-06 MED ORDER — AZITHROMYCIN 250 MG PO TABS: ORAL_TABLET | ORAL | 0 refills | Status: DC

## 2021-05-06 NOTE — Progress Notes (Signed)
Virtual Visit via Telephone Note  I connected with Debbie Delacruz on 05/06/21 at  8:30 AM EST by telephone and verified that I am speaking with the correct person using two identifiers.  Location: Patient: at video  Provider: Provider: Provider's office at  [facility], [place].      I discussed the limitations, risks, security and privacy concerns of performing an evaluation and management service by telephone and the availability of in person appointments. I also discussed with the patient that there may be a patient responsible charge related to this service. The patient expressed understanding and agreed to proceed.   History of Present Illness: Patient started feeling bad before last week,she has cough, runny, sneezing, headache, watery eyes.. Denies worst headache in her life. Pain is 2/10. Chest congestion, mild chest tightness with coughing.  She has not tested for covid, or flu. Denies any ear pain.  Co worker traveled to [place] and came back with double pneumonia.   99.9 temp.  Patient  denies any fever, body aches,chills, rash, chest pain, shortness of breath, nausea, vomiting, or diarrhea.   Observations/Objective:   Patient is alert and oriented and responsive to questions Engages in conversation with provider. Speaks in full sentences without any pauses without any shortness of breath or distress.   Assessment and Plan:   Bacterial infection - Plan: azithromycin (ZITHROMAX Z-PAK) 250 MG tablet  History of URI (upper respiratory infection)  Acute cough - Plan: predniSONE (STERAPRED UNI-PAK 21 TAB) 10 MG (21) TBPK tablet, guaiFENesin-codeine (ROBITUSSIN AC) 100-10 MG/5ML syrup   Follow Up Instructions:   Advised in person evaluation at anytime is advised if any symptoms do not improve, worsen or change at any given time.  Red Flags discussed. The patient was given clear instructions to go to ER or return to medical center if any  red flags develop, symptoms do not improve, worsen or new problems develop. They verbalized understanding.  I discussed the assessment and treatment plan with the patient. The patient was provided an opportunity to ask questions and all were answered. The patient agreed with the plan and demonstrated an understanding of the instructions.   The patient was advised to call back or seek an in-person evaluation if the symptoms worsen or if the condition fails to improve as anticipated.  I provided 22 minutes of non-face-to-face time during this encounter.   Marcille Buffy, FNP

## 2021-05-07 ENCOUNTER — Telehealth: Payer: Self-pay | Admitting: Oncology

## 2021-05-07 DIAGNOSIS — Z20822 Contact with and (suspected) exposure to covid-19: Secondary | ICD-10-CM | POA: Diagnosis not present

## 2021-05-07 NOTE — Telephone Encounter (Signed)
Pt called in to reschedule her appt for 12-19 &12-20. Please call back at [phone]

## 2021-05-12 ENCOUNTER — Inpatient Hospital Stay: Payer: Medicare Other

## 2021-05-13 ENCOUNTER — Inpatient Hospital Stay: Payer: Medicare Other

## 2021-05-13 ENCOUNTER — Inpatient Hospital Stay: Payer: Medicare Other | Admitting: Oncology

## 2021-05-19 DIAGNOSIS — N1832 Chronic kidney disease, stage 3b: Secondary | ICD-10-CM | POA: Diagnosis not present

## 2021-05-19 DIAGNOSIS — R809 Proteinuria, unspecified: Secondary | ICD-10-CM | POA: Diagnosis not present

## 2021-05-19 DIAGNOSIS — I10 Essential (primary) hypertension: Secondary | ICD-10-CM | POA: Diagnosis not present

## 2021-05-21 ENCOUNTER — Other Ambulatory Visit: Payer: Self-pay | Admitting: *Deleted

## 2021-05-21 DIAGNOSIS — D509 Iron deficiency anemia, unspecified: Secondary | ICD-10-CM

## 2021-05-22 NOTE — Progress Notes (Deleted)
[facility]  [phone] [phone]  ID: [name] OB: 02/13/1946  MR#: [id number]  YBW#:[id number]  Patient Care Team: [name], MD as PCP - General (Family Medicine) [name], MD as PCP - Cardiology (Cardiology) [name], MD (Internal Medicine) [name], MD (Inactive) as Consulting Physician (Cardiothoracic Surgery) [name], MD as Consulting Physician (Cardiology) Lloyd Huger, MD as Consulting Physician (Oncology)  CHIEF COMPLAINT: Iron deficiency anemia, right renal mass.  INTERVAL HISTORY: Patient returns to clinic today for repeat laboratory work, further evaluation, and consideration of additional IV Feraheme.  She currently feels well and is asymptomatic.  She does not complain of any weakness or fatigue.  She has no neurologic complaints. She denies any recent fevers or illnesses. She has a good appetite and denies weight loss.  She denies any chest pain, shortness of breath, cough, or hemoptysis.  She denies any nausea, vomiting, constipation, or diarrhea. She denies any melena or hematochezia. She has no urinary complaints.  Patient offers no specific complaints today.  REVIEW OF SYSTEMS:   Review of Systems  Constitutional: Negative.  Negative for fever, malaise/fatigue and weight loss.  Respiratory: Negative.  Negative for cough and shortness of breath.   Cardiovascular: Negative.  Negative for chest pain and leg swelling.  Gastrointestinal: Negative.  Negative for abdominal pain, blood in stool and melena.  Genitourinary: Negative.  Negative for hematuria.  Musculoskeletal: Negative.  Negative for joint pain.  Skin: Negative.  Negative for rash.  Neurological: Negative.  Negative for dizziness, sensory change, focal weakness, weakness and headaches.  Psychiatric/Behavioral: Negative.  The patient is not nervous/anxious and does not have insomnia.    As per HPI.  Otherwise, a complete review of systems is negative.  PAST MEDICAL HISTORY: Past Medical History:  Diagnosis Date   A-fib ([place])    Anemia    Anginal pain ([place])    Anxiety    Arthritis    CAD (coronary artery disease)    Cancer (HCC)    Carpal tunnel syndrome    bilateral   Cataract    bil removed cataracts   Coronary artery disease    2x stents, Dr. Clayborn Bigness   Depression    Dysrhythmia    Family history of adverse reaction to anesthesia    "brother; S/P lipotripsy in [place]; transferred to [facility]; had to put him on life support for 2 days"   GERD (gastroesophageal reflux disease)    Headache    "weekly" (09/13/2014)   History of blood transfusion 05/2014   "we haven't figured out why I needed it yet" (09/13/2014)   History of hiatal hernia    Hyperglycemia    Hyperlipidemia    Hypertension    Kidney stone    Myocardial infarct ([place])    Myocardial infarct, old 1999   15 years ago   Obesity    PONV (postoperative nausea and vomiting)    Renal mass    S/P CABG x 4 09/18/2014   LIMA to LAD, SVG to PDA-dRCA sequentially, SVG to OM, EVH via right thigh    Sleep apnea    Sleep apnea    "don't use a mask" (09/13/2014)   Unstable angina ([place])     PAST SURGICAL HISTORY: Past Surgical History:  Procedure Laterality Date   ABDOMINAL HYSTERECTOMY     APPENDECTOMY     BACK SURGERY     BLADDER SURGERY     CARDIAC CATHETERIZATION  06/2014  CARDIAC CATHETERIZATION N/A 06/16/2016   Procedure: Left Heart Cath and Cors/Grafts Angiography;  Surgeon: Burnell Blanks, MD;  Location: [facility];  Service: Cardiovascular;  Laterality: N/A;   CARPAL TUNNEL RELEASE Left    CATARACT EXTRACTION W/ INTRAOCULAR LENS  IMPLANT, BILATERAL     CHOLECYSTECTOMY     COLONOSCOPY     CORONARY ANGIOPLASTY WITH STENT PLACEMENT  1999   [facility] x2 stent   CORONARY ARTERY BYPASS GRAFT N/A 09/18/2014   Procedure: CORONARY ARTERY BYPASS GRAFTING (CABG)times four using LIMA  to  LAD:SVG to  PD and DIST RCA;SVG to OM;, EVH Right thigh;  Surgeon: [name], MD;  Location: [facility];  Service: Open Heart Surgery;  Laterality: N/A;   CYSTOSCOPY W/ RETROGRADES Left 03/15/2020   Procedure: CYSTOSCOPY WITH RETROGRADE PYELOGRAM;  Surgeon: Billey Co, MD;  Location: [facility];  Service: Urology;  Laterality: Left;   CYSTOSCOPY WITH STENT PLACEMENT Left 02/20/2020   Procedure: CYSTOSCOPY WITH STENT PLACEMENT;  Surgeon: Billey Co, MD;  Location: [facility];  Service: Urology;  Laterality: Left;   CYSTOSCOPY/URETEROSCOPY/HOLMIUM LASER/STENT PLACEMENT Left 03/15/2020   Procedure: CYSTOSCOPY/URETEROSCOPY;  Surgeon: Billey Co, MD;  Location: [facility];  Service: Urology;  Laterality: Left;   ESOPHAGOGASTRODUODENOSCOPY N/A 09/17/2014   Procedure: ESOPHAGOGASTRODUODENOSCOPY (EGD);  Surgeon: Inda Castle, MD;  Location: [facility];  Service: Endoscopy;  Laterality: N/A;   heart stents     [place] Right X 3   "last one was in the 1990's; still have hernia there now" (09/13/2014)   [address]  1990's?   LUMBAR SPINE SURGERY     NEPHRECTOMY Right    removal of ovaries     TEE WITHOUT CARDIOVERSION N/A 09/18/2014   Procedure: TRANSESOPHAGEAL ECHOCARDIOGRAM (TEE);  Surgeon: [name], MD;  Location: [facility];  Service: Open Heart Surgery;  Laterality: N/A;   UPPER GASTROINTESTINAL ENDOSCOPY      FAMILY HISTORY: Family History  Problem Relation Age of Onset   Coronary artery disease Mother    Osteoporosis Mother    Heart disease Mother    Stroke Mother    Heart attack Mother    Hypertension Mother    Hypertension Father    Coronary artery disease Father    COPD Father    Heart disease Father    Heart attack Father    Glaucoma Father    Osteoporosis Father    Emphysema Father    Cancer Sister 10       colon   Hypertension Sister     Colon cancer Sister        dx in her 69's   Breast cancer Sister    Colon polyps Sister    Stroke Brother    Hypertension Brother    Colon polyps Brother    Stroke Maternal Grandfather    Stroke Maternal Grandmother    Osteoporosis Maternal Grandmother    Hypertension Maternal Grandmother    Hypertension Paternal Grandmother    Hypertension Paternal Grandfather    Breast cancer Maternal Aunt        2 aunts-50's   Esophageal cancer Neg Hx    Stomach cancer Neg Hx    Rectal cancer Neg Hx    Pancreatic cancer Neg Hx     ADVANCED DIRECTIVES (Y/N):  N  HEALTH MAINTENANCE: Social History  Tobacco Use   Smoking status: Never   Smokeless tobacco: Never  Vaping Use   Vaping Use: Never used  Substance Use Topics   Alcohol use: Not Currently    Alcohol/week: 0.0 standard drinks   Drug use: No     Colonoscopy:  PAP:  Bone density:  Lipid panel:  Allergies  Allergen Reactions   Ferrous Fumarate Other (See Comments)    STOMACH ISSUES STOMACH ISSUES STOMACH ISSUES   Lipitor [Atorvastatin] Other (See Comments)    myalgia    Current Outpatient Medications  Medication Sig Dispense Refill   acetaminophen (TYLENOL) 500 MG tablet Take 500 mg by mouth every 6 (six) hours as needed for headache.     amLODipine (NORVASC) 5 MG tablet TAKE 1 TABLET BY MOUTH ONCE DAILY 90 tablet 3   aspirin EC 81 MG tablet Take 1 tablet (81 mg total) by mouth daily.     Blood Pressure Monitoring (BLOOD PRESSURE KIT) DEVI Check blood pressure daily, brand at patient and insurance preference, dx code I10 1 each 0   Evolocumab (REPATHA SURECLICK) 035 MG/ML SOAJ Inject 140 mg into the skin every 14 (fourteen) days. 6 mL 1   ezetimibe (ZETIA) 10 MG tablet Take 1 tablet (10 mg total) by mouth daily. 90 tablet 3   guaiFENesin-codeine (ROBITUSSIN AC) 100-10 MG/5ML syrup Take 5 mLs by mouth at bedtime as needed for cough. 180 mL 0   isosorbide mononitrate (IMDUR) 30 MG 24 hr tablet TAKE 1 TABLET BY MOUTH  ONCE DAILY 90 tablet 1   losartan (COZAAR) 25 MG tablet Take by mouth daily.     metoprolol succinate (TOPROL-XL) 50 MG 24 hr tablet TAKE 1 TABLET BY MOUTH ONCE DAILY 30 tablet 3   nitroGLYCERIN (NITROSTAT) 0.4 MG SL tablet Place 1 tablet (0.4 mg total) under the tongue every 5 (five) minutes as needed for chest pain. Take for up to 3 doses and then call 911 if still having chest pain. 50 tablet 3   ondansetron (ZOFRAN) 4 MG tablet Take 1 tablet (4 mg total) by mouth every 8 (eight) hours as needed for up to 10 doses for nausea or vomiting. 10 tablet 0   pantoprazole (PROTONIX) 40 MG tablet Take 1 tablet (40 mg total) by mouth daily. 30 tablet 11   predniSONE (STERAPRED UNI-PAK 21 TAB) 10 MG (21) TBPK tablet PO: Take 6 tablets on day 1:Take 5 tablets day 2:Take 4 tablets day 3: Take 3 tablets day 4:Take 2 tablets day five: 5 Take 1 tablet day 6 21 tablet 0   rosuvastatin (CRESTOR) 40 MG tablet TAKE 1 TABLET BY MOUTH ONCE DAILY 90 tablet 1   No current facility-administered medications for this visit.    OBJECTIVE: There were no vitals filed for this visit.    There is no height or weight on file to calculate BMI.    ECOG FS:0 - Asymptomatic  General: Well-developed, well-nourished, no acute distress. Eyes: Pink conjunctiva, anicteric sclera. HEENT: Normocephalic, moist mucous membranes. Lungs: No audible wheezing or coughing. Heart: Regular rate and rhythm. Abdomen: Soft, nontender, no obvious distention. Musculoskeletal: No edema, cyanosis, or clubbing. Neuro: Alert, answering all questions appropriately. Cranial nerves grossly intact. Skin: No rashes or petechiae noted. Psych: Normal affect.   LAB RESULTS:  Lab Results  Component Value Date   NA 138 10/04/2020   K 3.6 10/04/2020   CL 110 10/04/2020   CO2 23 10/04/2020   GLUCOSE 133 (H) 10/04/2020   BUN 22 10/04/2020  CREATININE 1.15 (H) 10/04/2020   CALCIUM 7.9 (L) 10/04/2020   PROT 6.5 02/25/2021   ALBUMIN 4.1 02/25/2021    AST 18 02/25/2021   ALT 15 02/25/2021   ALKPHOS 47 02/25/2021   BILITOT 0.5 02/25/2021   GFRNONAA 51 (L) 10/04/2020   GFRAA >60 02/24/2020    Lab Results  Component Value Date   WBC 5.4 02/12/2021   NEUTROABS 3.2 02/12/2021   HGB 12.7 02/12/2021   HCT 39.7 02/12/2021   MCV 92.8 02/12/2021   PLT 171 02/12/2021   Lab Results  Component Value Date   IRON 43 02/12/2021   TIBC 365 02/12/2021   IRONPCTSAT 12 02/12/2021   Lab Results  Component Value Date   FERRITIN 16 02/12/2021     STUDIES: No results found.   ASSESSMENT: Iron deficiency anemia.  PLAN:    1. Iron deficiency anemia: Patient reports she cannot tolerate oral iron supplementation.  Her hemoglobin and iron stores have declined.  Previously, all of her other laboratory work was either negative or within normal limits.  Patient had a normal endoscopy in May 2017 and a normal colonoscopy in August 2014.  Patient will likely require repeat colonoscopy in the near future.  Proceed with 510 mg IV Feraheme today.  Return to clinic in 1 week for a second infusion.  Patient then return to clinic in 3 months with repeat laboratory work, further evaluation, and continuation of treatment if needed. 2.  Right renal cell carcinoma: Patient underwent partial nephrectomy on July 04, 2020 at [facility] revealing a stage I malignancy.  Continue follow-up and imaging with them.  I spent a total of 30 minutes reviewing chart data, face-to-face evaluation with the patient, counseling and coordination of care as detailed above.   Patient expressed understanding and was in agreement with this plan. She also understands that She can call clinic at any time with any questions, concerns, or complaints.    Lloyd Huger, MD   05/22/2021 6:04 PM

## 2021-05-27 ENCOUNTER — Telehealth: Payer: Self-pay | Admitting: Oncology

## 2021-05-27 NOTE — Telephone Encounter (Signed)
Pt called to reschedule her appts for 1-4 & 1-5. Call back at [phone]

## 2021-05-28 ENCOUNTER — Inpatient Hospital Stay: Payer: Medicare Other

## 2021-05-29 ENCOUNTER — Inpatient Hospital Stay: Payer: Medicare Other

## 2021-05-29 ENCOUNTER — Ambulatory Visit: Payer: Medicare Other | Admitting: Oncology

## 2021-05-30 IMAGING — CR DG ABDOMEN 2V
3 series · 3 of 3 positions shown · non-contrast
Comparison: CT abdomen and pelvis October 01, 2020

CLINICAL DATA: Small bowel obstruction

EXAM:
ABDOMEN - 2 VIEW

[abdomen erect]
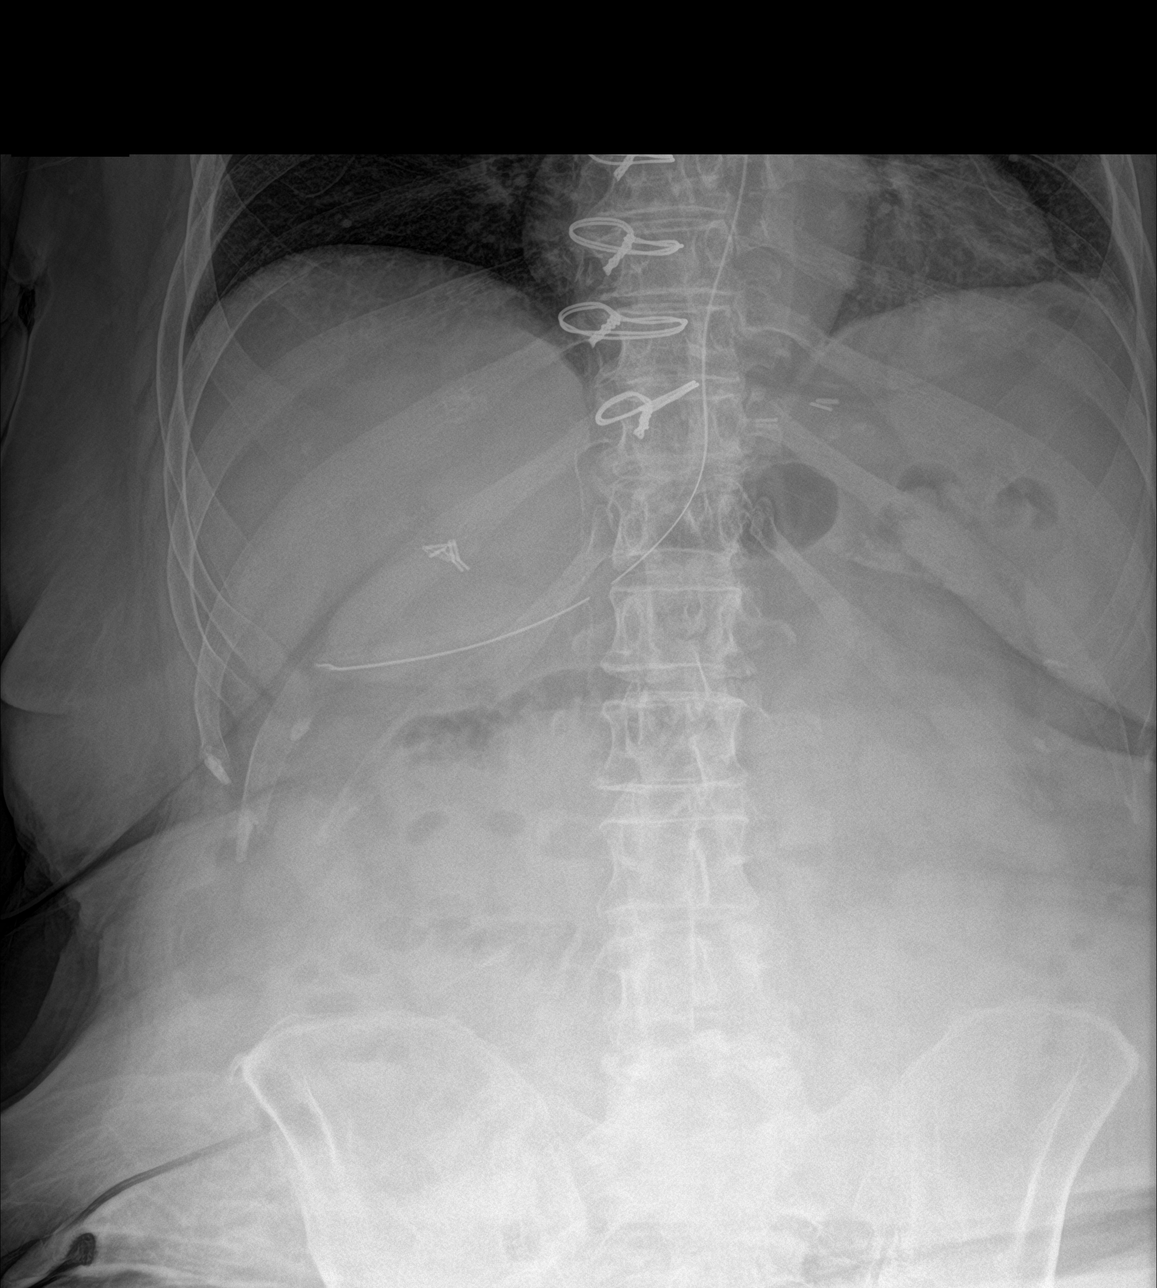

[abdomen supine (1 of 2)]
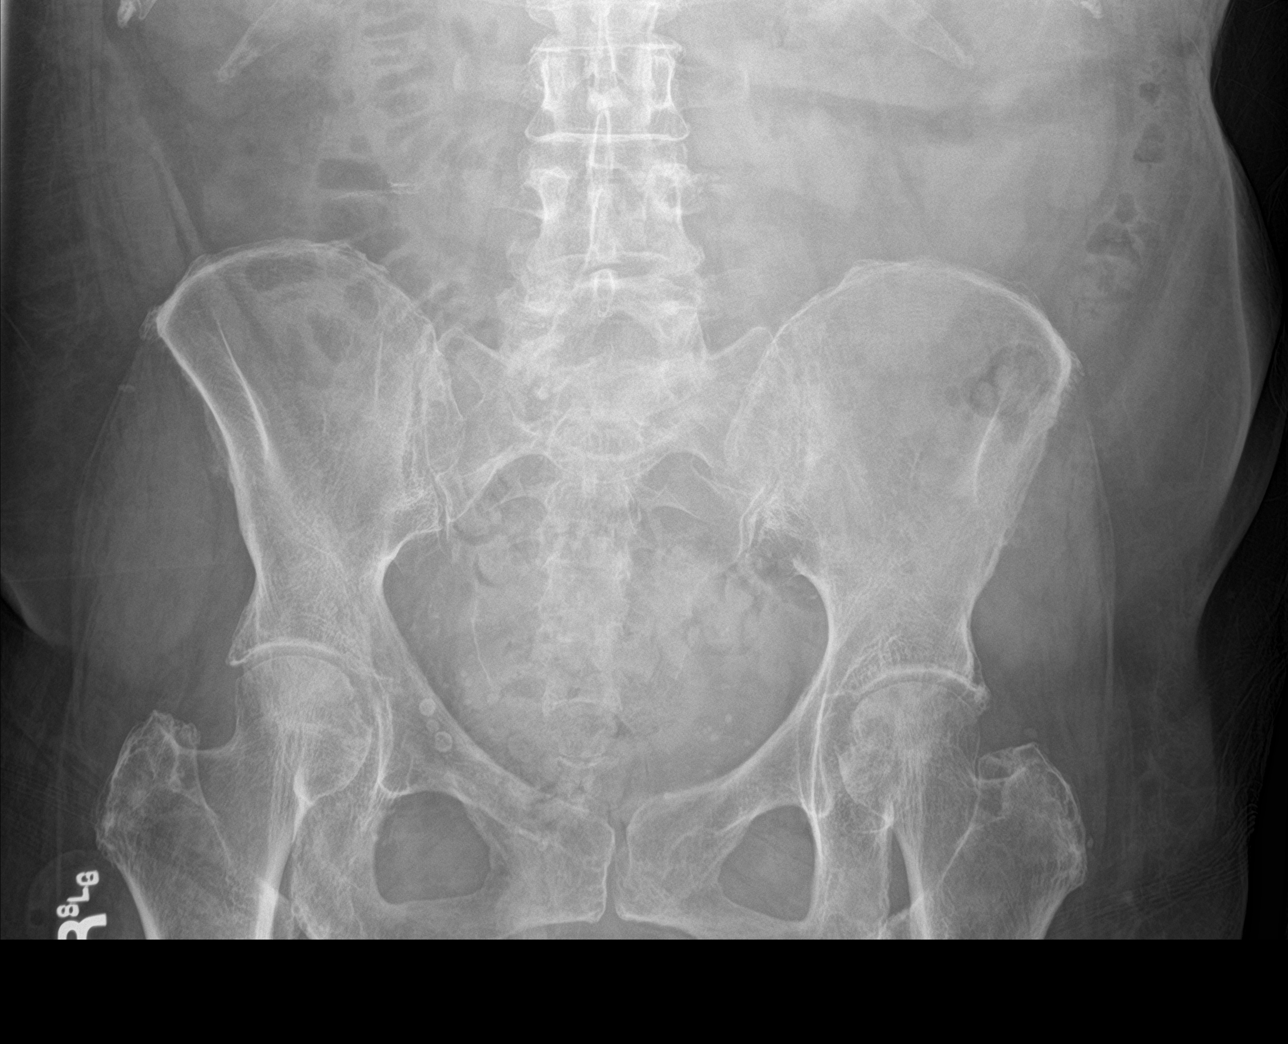

[abdomen supine (2 of 2)]
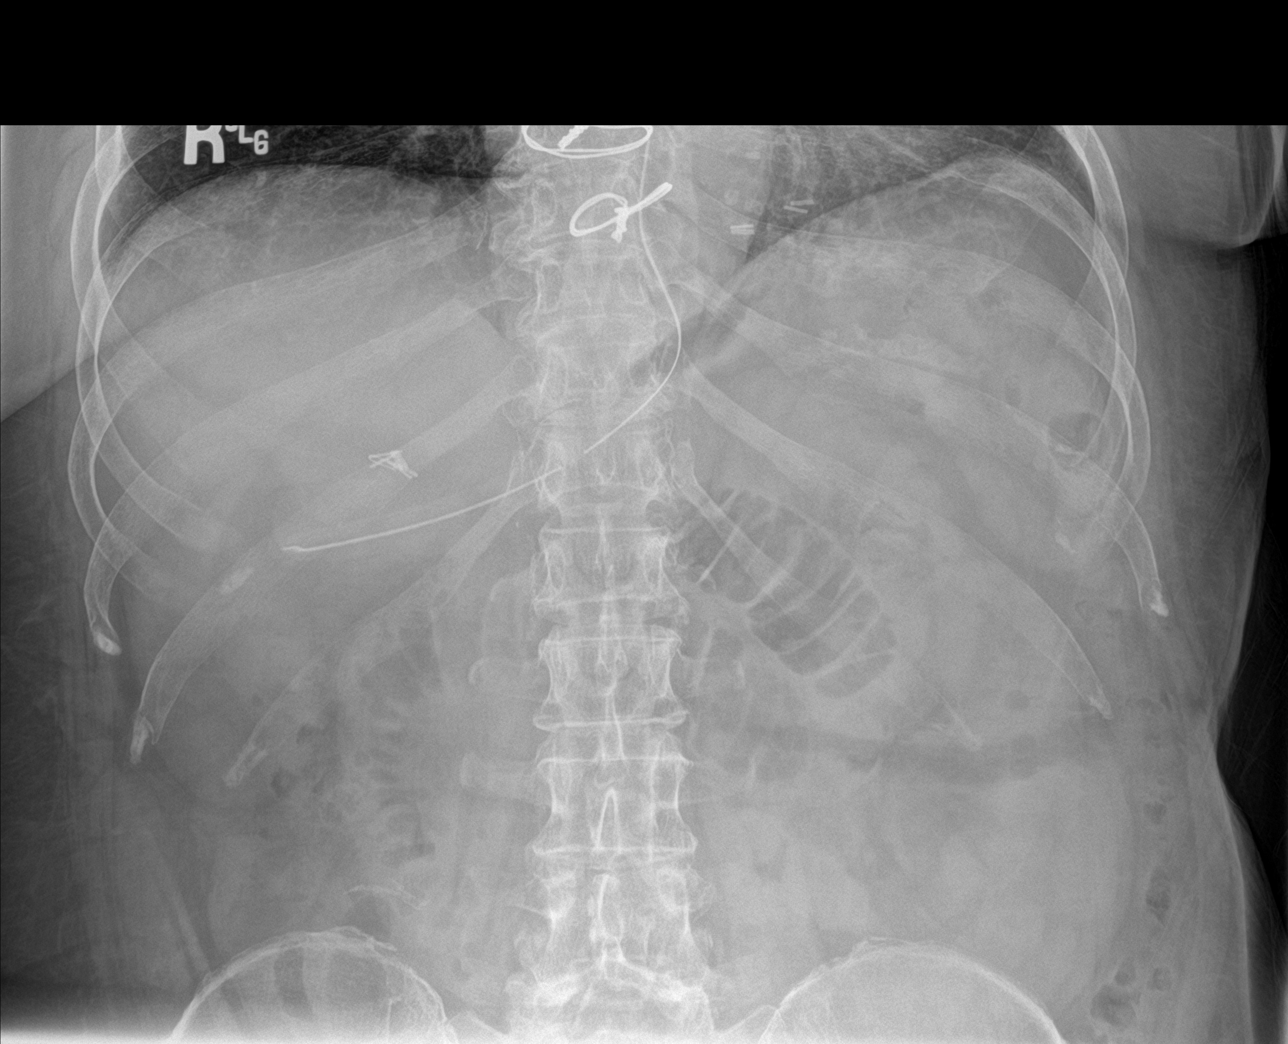

[3 of 3 positions shown; findings below may reference images not displayed]

FINDINGS: Supine and upright images were obtained. Nasogastric tube tip is in
the proximal duodenum. There are loops of mildly dilated small bowel
without appreciable air-fluid level. No free air evident. Surgical
clips in right upper quadrant. Apparent phleboliths in the pelvis.
Mild atelectasis left lung base. Lung bases otherwise clear.
IMPRESSION: Nasogastric tube tip in proximal duodenum. Loops of mildly dilated
bowel without air-fluid levels. There may be resolving bowel
obstruction. No free air evident. Mild left base atelectasis. Lung
bases otherwise clear.

## 2021-05-30 IMAGING — CT CT ABD-PELV W/O CM
2 of 4 series · 15 of 46 positions shown, 17 images · non-contrast
Comparison: CT abdomen pelvis dated 02/19/2020.

CLINICAL DATA: 74-year-old female with flank pain. Concern for
kidney stone.

EXAM:
CT ABDOMEN AND PELVIS WITHOUT CONTRAST
TECHNIQUE: Multidetector CT imaging of the abdomen and pelvis was performed
following the standard protocol without IV contrast.

[Series 2: routine abd/pel wo · axial · 0.89mm/px · z∈[-360,+70]mm · 12 of 96 slices shown, 14 images]
[im 5/96  soft-tissue]
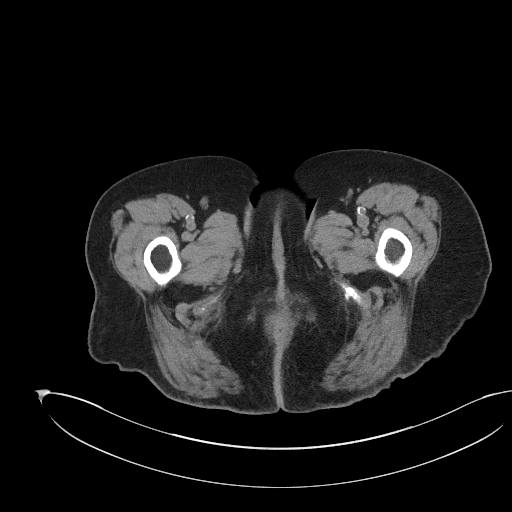
[im 5/96  bone]
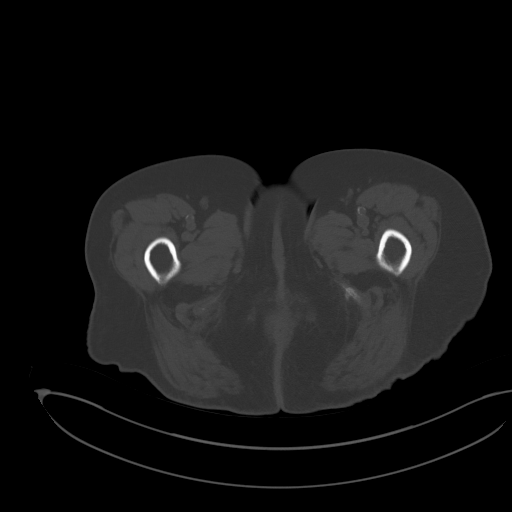
[im 13/96  soft-tissue]
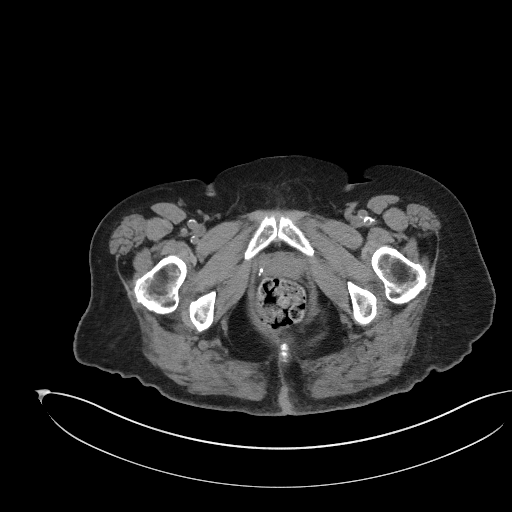
[im 22/96  soft-tissue]
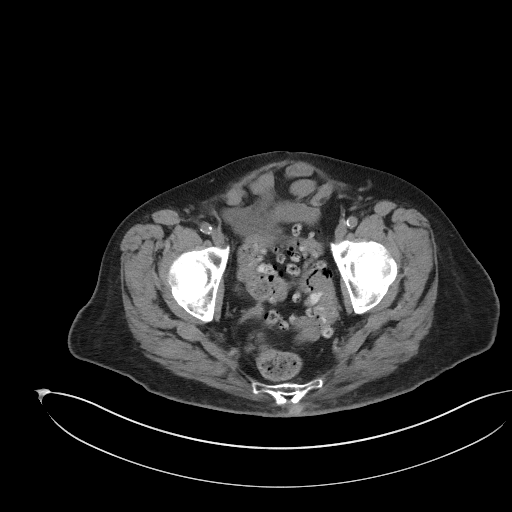
[im 31/96  soft-tissue]
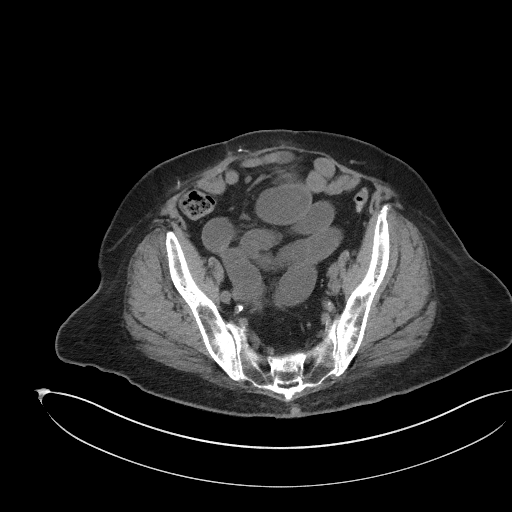
[im 35/96  soft-tissue]
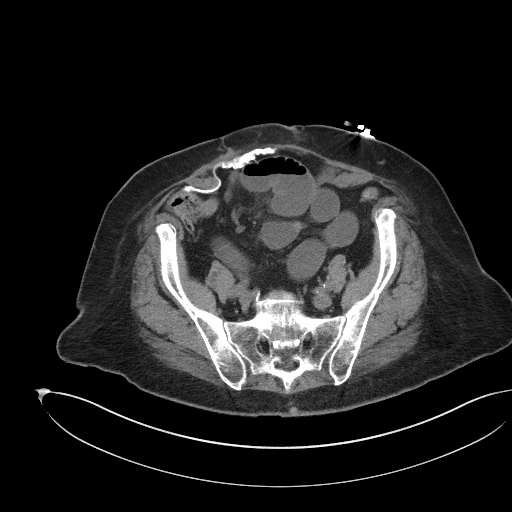
[im 44/96  soft-tissue]
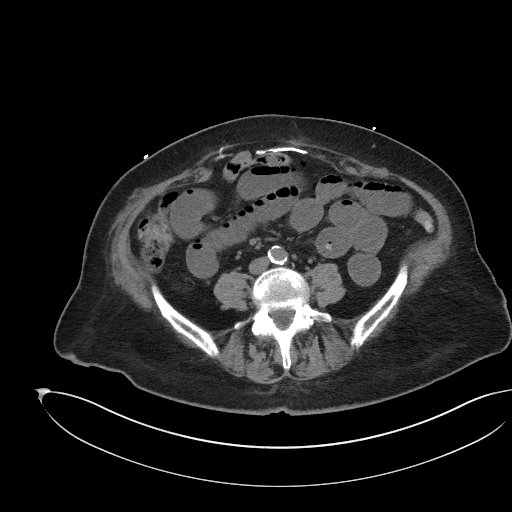
[im 52/96  soft-tissue]
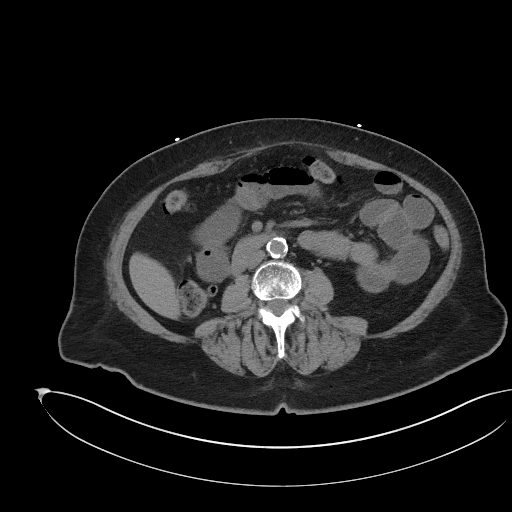
[im 61/96  soft-tissue]
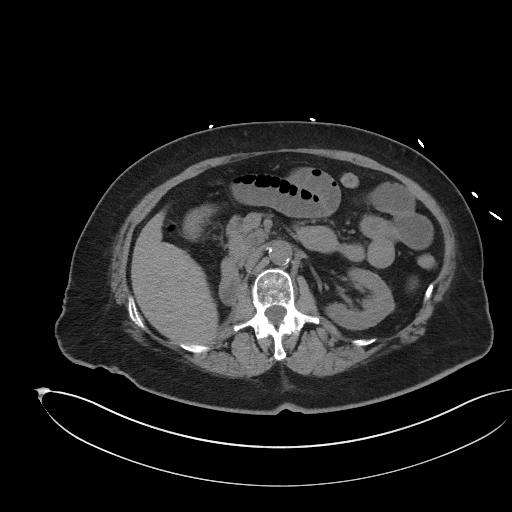
[im 65/96  soft-tissue]
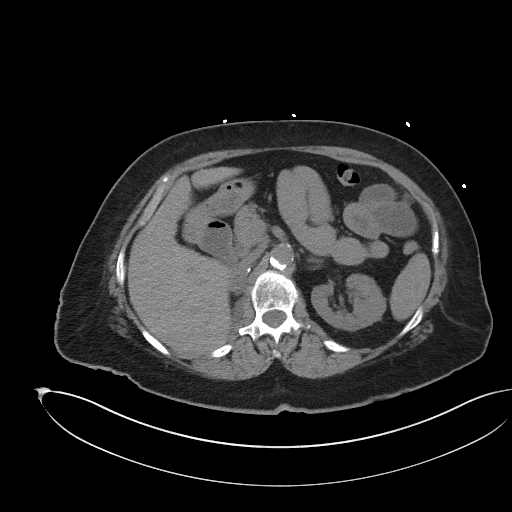
[im 65/96  bone]
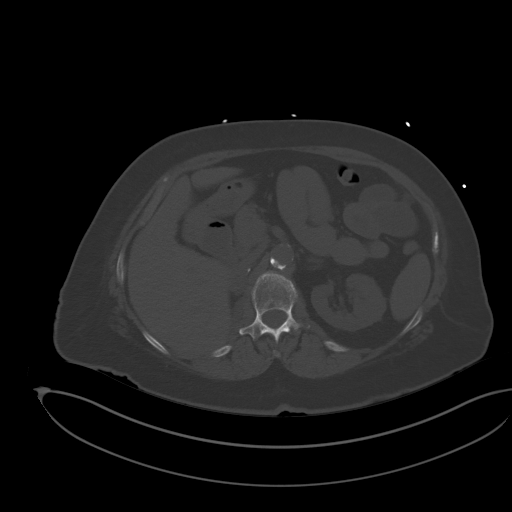
[im 74/96  soft-tissue]
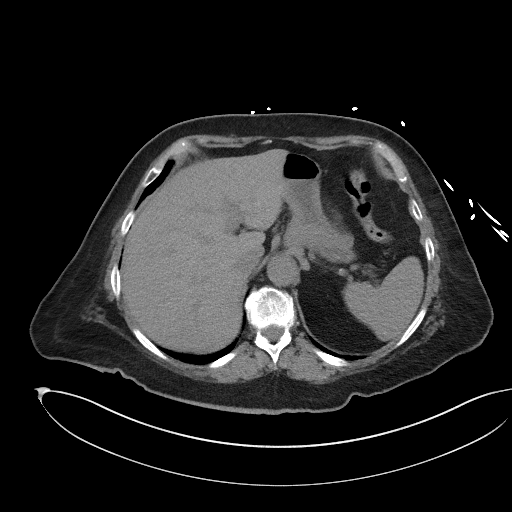
[im 83/96  soft-tissue]
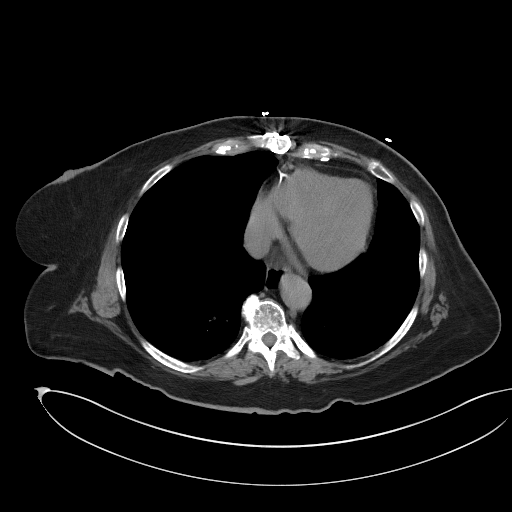
[im 91/96  soft-tissue]
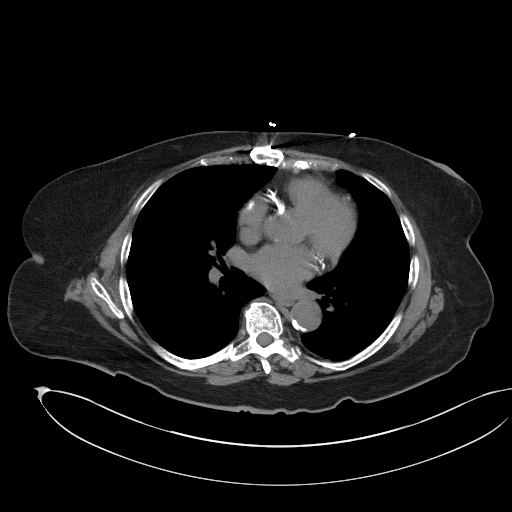

[Series 5: coronal st · coronal · 0.70mm/px · 3 of 92 slices shown]
[im 31/92  soft-tissue]
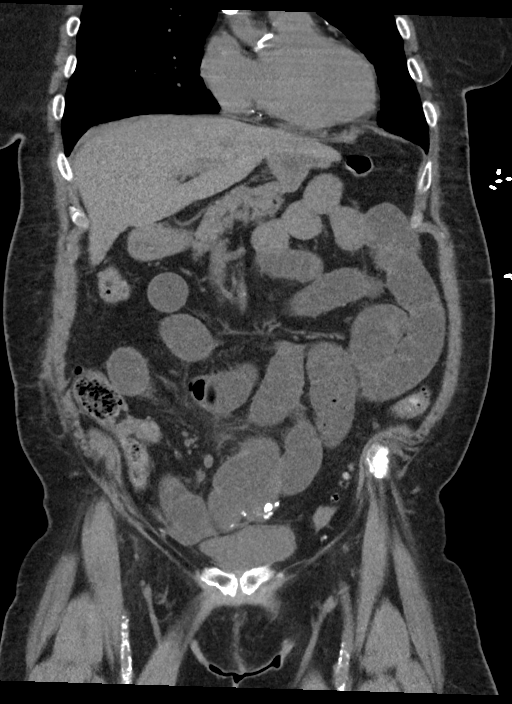
[im 41/92  soft-tissue]
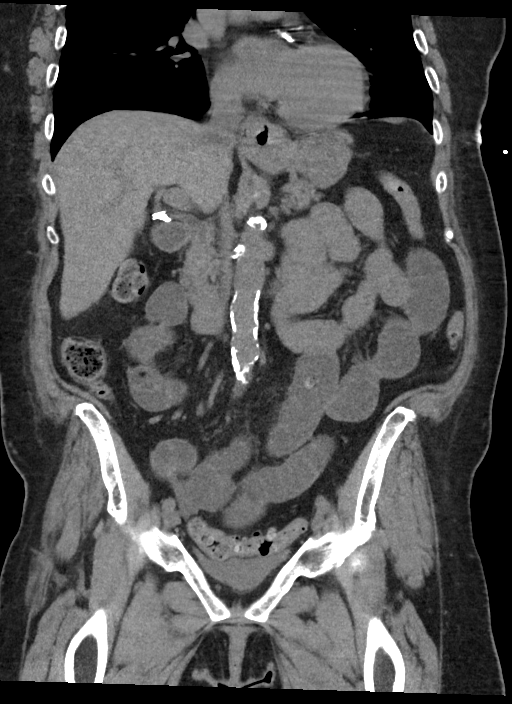
[im 51/92  soft-tissue]
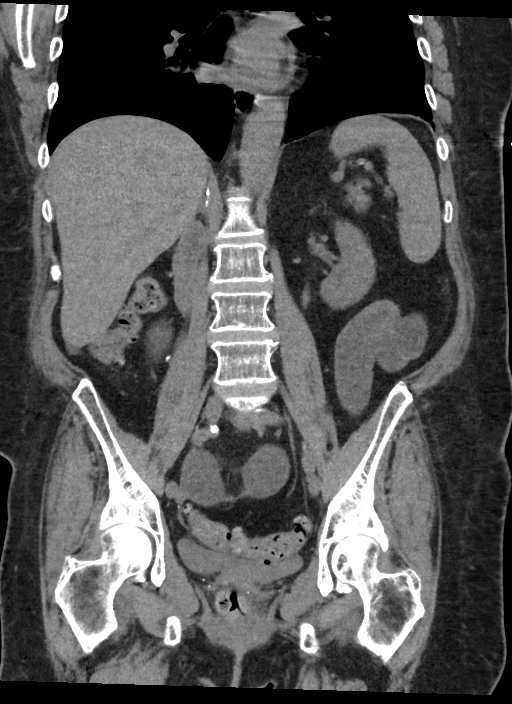

[15 of 46 positions shown; findings below may reference images not displayed]

FINDINGS: Evaluation of this exam is limited in the absence of intravenous
contrast.

Lower chest: The visualized lung bases are clear. Three vessel
coronary vascular calcification and postsurgical changes of CABG.

No intra-abdominal free air or free fluid.

Hepatobiliary: The liver is unremarkable. No intrahepatic biliary
ductal dilatation. Cholecystectomy. No retained calcified stone
noted in the central CBD.

Pancreas: Unremarkable. No pancreatic ductal dilatation or
surrounding inflammatory changes.

Spleen: Normal in size without focal abnormality.

Adrenals/Urinary Tract: The right adrenal gland is not visualized.
The left adrenal gland is unremarkable. Status post prior right
nephrectomy. There is no hydronephrosis or nephrolithiasis of the
left kidney. The left ureter and urinary bladder appear
unremarkable.

Stomach/Bowel: There is severe colonic diverticulosis without active
inflammatory changes. There is a small hiatal hernia. There is
postsurgical changes of the bowel with anastomotic suture in the
lower abdomen. There is dilatation of small bowel loops proximal to
the anastomosis measuring up to 3.5 cm. The distal small bowel are
collapsed. The appendix is unremarkable as visualized.

Vascular/Lymphatic: Advanced aortoiliac atherosclerotic disease. The
IVC is unremarkable. No portal venous gas. There is no adenopathy.

Reproductive: Hysterectomy.  No adnexal masses.

Other: Ventral hernia repair mesh.

Musculoskeletal: Osteopenia with degenerative changes of the spine.
No acute osseous pathology.
IMPRESSION: 1. Small-bowel obstruction with transition zone in the lower abdomen
at the ileo ileal anastomosis.
2. Severe colonic diverticulosis.
3. Status post prior right nephrectomy.
4. Aortic Atherosclerosis ([id number]).

## 2021-06-10 ENCOUNTER — Other Ambulatory Visit: Payer: Self-pay | Admitting: Family Medicine

## 2021-06-10 ENCOUNTER — Telehealth: Payer: Self-pay | Admitting: Oncology

## 2021-06-10 NOTE — Telephone Encounter (Signed)
Pt called to reschedule her appt for 1-18 and 1-19 due to her husband being in the hospital. Call back at [phone]

## 2021-06-10 NOTE — Progress Notes (Deleted)
[place]  Telephone:([phone]) [phone] Fax:(336) [phone]  ID: Debbie Delacruz OB: 07-20-1945  MR#: [id number]  FKC#:[id number]  Patient Care Team: [name], MD as PCP - General (Family Medicine) [name], MD as PCP - Cardiology (Cardiology) [name], MD (Internal Medicine) [name], MD (Inactive) as Consulting Physician (Cardiothoracic Surgery) [name], MD as Consulting Physician (Cardiology) Lloyd Huger, MD as Consulting Physician (Oncology)  CHIEF COMPLAINT: Iron deficiency anemia, right renal mass.  INTERVAL HISTORY: Patient returns to clinic today for repeat laboratory work, further evaluation, and consideration of additional IV Feraheme.  She currently feels well and is asymptomatic.  She does not complain of any weakness or fatigue.  She has no neurologic complaints. She denies any recent fevers or illnesses. She has a good appetite and denies weight loss.  She denies any chest pain, shortness of breath, cough, or hemoptysis.  She denies any nausea, vomiting, constipation, or diarrhea. She denies any melena or hematochezia. She has no urinary complaints.  Patient offers no specific complaints today.  REVIEW OF SYSTEMS:   Review of Systems  Constitutional: Negative.  Negative for fever, malaise/fatigue and weight loss.  Respiratory: Negative.  Negative for cough and shortness of breath.   Cardiovascular: Negative.  Negative for chest pain and leg swelling.  Gastrointestinal: Negative.  Negative for abdominal pain, blood in stool and melena.  Genitourinary: Negative.  Negative for hematuria.  Musculoskeletal: Negative.  Negative for joint pain.  Skin: Negative.  Negative for rash.  Neurological: Negative.  Negative for dizziness, sensory change, focal weakness, weakness and headaches.  Psychiatric/Behavioral: Negative.  The patient is not nervous/anxious and does not have insomnia.    As per HPI.  Otherwise, a complete review of systems is negative.  PAST MEDICAL HISTORY: Past Medical History:  Diagnosis Date   A-fib ([place])    Anemia    Anginal pain ([place])    Anxiety    Arthritis    CAD (coronary artery disease)    Cancer (HCC)    Carpal tunnel syndrome    bilateral   Cataract    bil removed cataracts   Coronary artery disease    2x stents, Dr. Clayborn Bigness   Depression    Dysrhythmia    Family history of adverse reaction to anesthesia    "brother; S/P lipotripsy in [place]; transferred to [facility]; had to put him on life support for 2 days"   GERD (gastroesophageal reflux disease)    Headache    "weekly" (09/13/2014)   History of blood transfusion 05/2014   "we haven't figured out why I needed it yet" (09/13/2014)   History of hiatal hernia    Hyperglycemia    Hyperlipidemia    Hypertension    Kidney stone    Myocardial infarct ([place])    Myocardial infarct, old 1999   15 years ago   Obesity    PONV (postoperative nausea and vomiting)    Renal mass    S/P CABG x 4 09/18/2014   LIMA to LAD, SVG to PDA-dRCA sequentially, SVG to OM, EVH via right thigh    Sleep apnea    Sleep apnea    "don't use a mask" (09/13/2014)   Unstable angina ([place])     PAST SURGICAL HISTORY: Past Surgical History:  Procedure Laterality Date   ABDOMINAL HYSTERECTOMY     APPENDECTOMY     BACK SURGERY     BLADDER SURGERY     CARDIAC CATHETERIZATION  06/2014  CARDIAC CATHETERIZATION N/A 06/16/2016   Procedure: Left Heart Cath and Cors/Grafts Angiography;  Surgeon: Burnell Blanks, MD;  Location: [facility];  Service: Cardiovascular;  Laterality: N/A;   CARPAL TUNNEL RELEASE Left    CATARACT EXTRACTION W/ INTRAOCULAR LENS  IMPLANT, BILATERAL     CHOLECYSTECTOMY     COLONOSCOPY     CORONARY ANGIOPLASTY WITH STENT PLACEMENT  1999   [facility] x2 stent   CORONARY ARTERY BYPASS GRAFT N/A 09/18/2014   Procedure: CORONARY ARTERY BYPASS GRAFTING (CABG)times four using LIMA  to  LAD:SVG to  PD and DIST RCA;SVG to OM;, EVH Right thigh;  Surgeon: [name], MD;  Location: [facility];  Service: Open Heart Surgery;  Laterality: N/A;   CYSTOSCOPY W/ RETROGRADES Left 03/15/2020   Procedure: CYSTOSCOPY WITH RETROGRADE PYELOGRAM;  Surgeon: Billey Co, MD;  Location: [facility];  Service: Urology;  Laterality: Left;   CYSTOSCOPY WITH STENT PLACEMENT Left 02/20/2020   Procedure: CYSTOSCOPY WITH STENT PLACEMENT;  Surgeon: Billey Co, MD;  Location: [facility];  Service: Urology;  Laterality: Left;   CYSTOSCOPY/URETEROSCOPY/HOLMIUM LASER/STENT PLACEMENT Left 03/15/2020   Procedure: CYSTOSCOPY/URETEROSCOPY;  Surgeon: Billey Co, MD;  Location: [facility];  Service: Urology;  Laterality: Left;   ESOPHAGOGASTRODUODENOSCOPY N/A 09/17/2014   Procedure: ESOPHAGOGASTRODUODENOSCOPY (EGD);  Surgeon: Inda Castle, MD;  Location: [facility];  Service: Endoscopy;  Laterality: N/A;   heart stents     [place] Right X 3   "last one was in the 1990's; still have hernia there now" (09/13/2014)   [address]  1990's?   LUMBAR SPINE SURGERY     NEPHRECTOMY Right    removal of ovaries     TEE WITHOUT CARDIOVERSION N/A 09/18/2014   Procedure: TRANSESOPHAGEAL ECHOCARDIOGRAM (TEE);  Surgeon: [name], MD;  Location: [facility];  Service: Open Heart Surgery;  Laterality: N/A;   UPPER GASTROINTESTINAL ENDOSCOPY      FAMILY HISTORY: Family History  Problem Relation Age of Onset   Coronary artery disease Mother    Osteoporosis Mother    Heart disease Mother    Stroke Mother    Heart attack Mother    Hypertension Mother    Hypertension Father    Coronary artery disease Father    COPD Father    Heart disease Father    Heart attack Father    Glaucoma Father    Osteoporosis Father    Emphysema Father    Cancer Sister 10       colon   Hypertension Sister     Colon cancer Sister        dx in her 69's   Breast cancer Sister    Colon polyps Sister    Stroke Brother    Hypertension Brother    Colon polyps Brother    Stroke Maternal Grandfather    Stroke Maternal Grandmother    Osteoporosis Maternal Grandmother    Hypertension Maternal Grandmother    Hypertension Paternal Grandmother    Hypertension Paternal Grandfather    Breast cancer Maternal Aunt        2 aunts-50's   Esophageal cancer Neg Hx    Stomach cancer Neg Hx    Rectal cancer Neg Hx    Pancreatic cancer Neg Hx     ADVANCED DIRECTIVES (Y/N):  N  HEALTH MAINTENANCE: Social History  Tobacco Use   Smoking status: Never   Smokeless tobacco: Never  Vaping Use   Vaping Use: Never used  Substance Use Topics   Alcohol use: Not Currently    Alcohol/week: 0.0 standard drinks   Drug use: No     Colonoscopy:  PAP:  Bone density:  Lipid panel:  Allergies  Allergen Reactions   Ferrous Fumarate Other (See Comments)    STOMACH ISSUES STOMACH ISSUES STOMACH ISSUES   Lipitor [Atorvastatin] Other (See Comments)    myalgia    Current Outpatient Medications  Medication Sig Dispense Refill   acetaminophen (TYLENOL) 500 MG tablet Take 500 mg by mouth every 6 (six) hours as needed for headache.     amLODipine (NORVASC) 5 MG tablet TAKE 1 TABLET BY MOUTH ONCE DAILY 90 tablet 3   aspirin EC 81 MG tablet Take 1 tablet (81 mg total) by mouth daily.     Blood Pressure Monitoring (BLOOD PRESSURE KIT) DEVI Check blood pressure daily, brand at patient and insurance preference, dx code I10 1 each 0   Evolocumab (REPATHA SURECLICK) 035 MG/ML SOAJ Inject 140 mg into the skin every 14 (fourteen) days. 6 mL 1   ezetimibe (ZETIA) 10 MG tablet Take 1 tablet (10 mg total) by mouth daily. 90 tablet 3   guaiFENesin-codeine (ROBITUSSIN AC) 100-10 MG/5ML syrup Take 5 mLs by mouth at bedtime as needed for cough. 180 mL 0   isosorbide mononitrate (IMDUR) 30 MG 24 hr tablet TAKE 1 TABLET BY MOUTH  ONCE DAILY 90 tablet 1   losartan (COZAAR) 25 MG tablet Take by mouth daily.     metoprolol succinate (TOPROL-XL) 50 MG 24 hr tablet TAKE 1 TABLET BY MOUTH ONCE DAILY 30 tablet 3   nitroGLYCERIN (NITROSTAT) 0.4 MG SL tablet Place 1 tablet (0.4 mg total) under the tongue every 5 (five) minutes as needed for chest pain. Take for up to 3 doses and then call 911 if still having chest pain. 50 tablet 3   ondansetron (ZOFRAN) 4 MG tablet Take 1 tablet (4 mg total) by mouth every 8 (eight) hours as needed for up to 10 doses for nausea or vomiting. 10 tablet 0   pantoprazole (PROTONIX) 40 MG tablet Take 1 tablet (40 mg total) by mouth daily. 30 tablet 11   predniSONE (STERAPRED UNI-PAK 21 TAB) 10 MG (21) TBPK tablet PO: Take 6 tablets on day 1:Take 5 tablets day 2:Take 4 tablets day 3: Take 3 tablets day 4:Take 2 tablets day five: 5 Take 1 tablet day 6 21 tablet 0   rosuvastatin (CRESTOR) 40 MG tablet TAKE 1 TABLET BY MOUTH ONCE DAILY 90 tablet 1   No current facility-administered medications for this visit.    OBJECTIVE: There were no vitals filed for this visit.    There is no height or weight on file to calculate BMI.    ECOG FS:0 - Asymptomatic  General: Well-developed, well-nourished, no acute distress. Eyes: Pink conjunctiva, anicteric sclera. HEENT: Normocephalic, moist mucous membranes. Lungs: No audible wheezing or coughing. Heart: Regular rate and rhythm. Abdomen: Soft, nontender, no obvious distention. Musculoskeletal: No edema, cyanosis, or clubbing. Neuro: Alert, answering all questions appropriately. Cranial nerves grossly intact. Skin: No rashes or petechiae noted. Psych: Normal affect.   LAB RESULTS:  Lab Results  Component Value Date   NA 138 10/04/2020   K 3.6 10/04/2020   CL 110 10/04/2020   CO2 23 10/04/2020   GLUCOSE 133 (H) 10/04/2020   BUN 22 10/04/2020  CREATININE 1.15 (H) 10/04/2020   CALCIUM 7.9 (L) 10/04/2020   PROT 6.5 02/25/2021   ALBUMIN 4.1 02/25/2021    AST 18 02/25/2021   ALT 15 02/25/2021   ALKPHOS 47 02/25/2021   BILITOT 0.5 02/25/2021   GFRNONAA 51 (L) 10/04/2020   GFRAA >60 02/24/2020    Lab Results  Component Value Date   WBC 5.4 02/12/2021   NEUTROABS 3.2 02/12/2021   HGB 12.7 02/12/2021   HCT 39.7 02/12/2021   MCV 92.8 02/12/2021   PLT 171 02/12/2021   Lab Results  Component Value Date   IRON 43 02/12/2021   TIBC 365 02/12/2021   IRONPCTSAT 12 02/12/2021   Lab Results  Component Value Date   FERRITIN 16 02/12/2021     STUDIES: No results found.   ASSESSMENT: Iron deficiency anemia.  PLAN:    1. Iron deficiency anemia: Patient reports she cannot tolerate oral iron supplementation.  Her hemoglobin and iron stores have declined.  Previously, all of her other laboratory work was either negative or within normal limits.  Patient had a normal endoscopy in May 2017 and a normal colonoscopy in August 2014.  Patient will likely require repeat colonoscopy in the near future.  Proceed with 510 mg IV Feraheme today.  Return to clinic in 1 week for a second infusion.  Patient then return to clinic in 3 months with repeat laboratory work, further evaluation, and continuation of treatment if needed. 2.  Right renal cell carcinoma: Patient underwent partial nephrectomy on July 04, 2020 at [facility] revealing a stage I malignancy.  Continue follow-up and imaging with them.  I spent a total of 30 minutes reviewing chart data, face-to-face evaluation with the patient, counseling and coordination of care as detailed above.   Patient expressed understanding and was in agreement with this plan. She also understands that She can call clinic at any time with any questions, concerns, or complaints.    Lloyd Huger, MD   06/10/2021 6:14 AM

## 2021-06-11 ENCOUNTER — Inpatient Hospital Stay: Payer: Medicare Other

## 2021-06-12 ENCOUNTER — Inpatient Hospital Stay: Payer: Medicare Other | Admitting: Oncology

## 2021-06-12 ENCOUNTER — Inpatient Hospital Stay: Payer: Medicare Other

## 2021-06-23 ENCOUNTER — Other Ambulatory Visit: Payer: Self-pay

## 2021-06-23 ENCOUNTER — Inpatient Hospital Stay: Payer: Medicare Other | Attending: Oncology

## 2021-06-23 DIAGNOSIS — D509 Iron deficiency anemia, unspecified: Secondary | ICD-10-CM | POA: Insufficient documentation

## 2021-06-23 DIAGNOSIS — C641 Malignant neoplasm of right kidney, except renal pelvis: Secondary | ICD-10-CM | POA: Insufficient documentation

## 2021-06-23 LAB — CBC WITH DIFFERENTIAL/PLATELET
Abs Immature Granulocytes: 0.02 10*3/uL (ref 0.00–0.07)
Basophils Absolute: 0 10*3/uL (ref 0.0–0.1)
Basophils Relative: 1 %
Eosinophils Absolute: 0.2 10*3/uL (ref 0.0–0.5)
Eosinophils Relative: 4 %
HCT: 43.4 % (ref 36.0–46.0)
Hemoglobin: 14 g/dL (ref 12.0–15.0)
Immature Granulocytes: 0 %
Lymphocytes Relative: 26 %
Lymphs Abs: 1.4 10*3/uL (ref 0.7–4.0)
MCH: 30.7 pg (ref 26.0–34.0)
MCHC: 32.3 g/dL (ref 30.0–36.0)
MCV: 95.2 fL (ref 80.0–100.0)
Monocytes Absolute: 0.5 10*3/uL (ref 0.1–1.0)
Monocytes Relative: 10 %
Neutro Abs: 3.2 10*3/uL (ref 1.7–7.7)
Neutrophils Relative %: 59 %
Platelets: 187 10*3/uL (ref 150–400)
RBC: 4.56 MIL/uL (ref 3.87–5.11)
RDW: 12.9 % (ref 11.5–15.5)
WBC: 5.4 10*3/uL (ref 4.0–10.5)
nRBC: 0 % (ref 0.0–0.2)

## 2021-06-23 LAB — IRON AND TIBC
Iron: 53 ug/dL (ref 28–170)
Saturation Ratios: 14 % (ref 10.4–31.8)
TIBC: 393 ug/dL (ref 250–450)
UIBC: 340 ug/dL

## 2021-06-23 LAB — FERRITIN: Ferritin: 15 ng/mL (ref 11–307)

## 2021-06-24 ENCOUNTER — Inpatient Hospital Stay: Payer: Medicare Other

## 2021-06-24 ENCOUNTER — Inpatient Hospital Stay: Payer: Medicare Other | Admitting: Oncology

## 2021-06-25 DIAGNOSIS — Z20822 Contact with and (suspected) exposure to covid-19: Secondary | ICD-10-CM | POA: Diagnosis not present

## 2021-06-26 ENCOUNTER — Encounter: Payer: Self-pay | Admitting: Family Medicine

## 2021-06-26 DIAGNOSIS — E7800 Pure hypercholesterolemia, unspecified: Secondary | ICD-10-CM

## 2021-06-27 MED ORDER — REPATHA SURECLICK 140 MG/ML ~~LOC~~ SOAJ: 140.0000 mg | SUBCUTANEOUS | 1 refills | Status: DC

## 2021-07-07 ENCOUNTER — Other Ambulatory Visit: Payer: Self-pay

## 2021-07-07 ENCOUNTER — Ambulatory Visit (INDEPENDENT_AMBULATORY_CARE_PROVIDER_SITE_OTHER): Payer: Medicare Other | Admitting: Family Medicine

## 2021-07-07 ENCOUNTER — Encounter: Payer: Self-pay | Admitting: Family Medicine

## 2021-07-07 DIAGNOSIS — E7800 Pure hypercholesterolemia, unspecified: Secondary | ICD-10-CM | POA: Diagnosis not present

## 2021-07-07 DIAGNOSIS — I2510 Atherosclerotic heart disease of native coronary artery without angina pectoris: Secondary | ICD-10-CM | POA: Diagnosis not present

## 2021-07-07 DIAGNOSIS — I10 Essential (primary) hypertension: Secondary | ICD-10-CM

## 2021-07-07 DIAGNOSIS — K2270 Barrett's esophagus without dysplasia: Secondary | ICD-10-CM

## 2021-07-07 DIAGNOSIS — N1831 Chronic kidney disease, stage 3a: Secondary | ICD-10-CM

## 2021-07-07 DIAGNOSIS — K2100 Gastro-esophageal reflux disease with esophagitis, without bleeding: Secondary | ICD-10-CM | POA: Diagnosis not present

## 2021-07-07 DIAGNOSIS — F439 Reaction to severe stress, unspecified: Secondary | ICD-10-CM | POA: Diagnosis not present

## 2021-07-07 NOTE — Assessment & Plan Note (Signed)
She will continue Repatha 140 mg every 14 days, Zetia 10 mg daily, and Crestor 40 mg daily.  Check LDL.

## 2021-07-07 NOTE — Progress Notes (Signed)
f °

## 2021-07-07 NOTE — Progress Notes (Signed)
Tommi Rumps, MD Phone: [phone]  Debbie Delacruz is a 76 y.o. female who presents today for f/u.  HYPERTENSION Disease Monitoring Home BP Monitoring generally around 130/70-80 Chest pain- no    Dyspnea- no Medications Compliance-  taking amlodipine, losartan, imdur, metoprolol.   Edema- no BMET    Component Value Date/Time   NA 138 10/04/2020 0441   NA 137 09/23/2020 0806   NA 137 06/06/2014 0448   K 3.6 10/04/2020 0441   K 3.3 (L) 06/06/2014 0448   CL 110 10/04/2020 0441   CL 101 06/06/2014 0448   CO2 23 10/04/2020 0441   CO2 29 06/06/2014 0448   GLUCOSE 133 (H) 10/04/2020 0441   GLUCOSE 106 (H) 06/06/2014 0448   BUN 22 10/04/2020 0441   BUN 30 (H) 09/23/2020 0806   BUN 20 (H) 06/06/2014 0448   CREATININE 1.15 (H) 10/04/2020 0441   CREATININE 1.05 06/06/2014 0448   CALCIUM 7.9 (L) 10/04/2020 0441   CALCIUM 8.2 (L) 06/06/2014 0448   GFRNONAA 51 (L) 10/04/2020 0441   GFRNONAA 56 (L) 06/06/2014 0448   GFRAA >60 02/24/2020 0456   GFRAA >60 06/06/2014 0448   GERD/barretts esophagus:   Reflux symptoms: no   Abd pain: no   Blood in stool: no  Dysphagia: no   EGD: 01/14/21 with barretts  Medication: protonix  HYPERLIPIDEMIA Symptoms Chest pain on exertion:  no   Medications: Compliance- taking repatha, crestor, zetia Right upper quadrant pain- no  Muscle aches- no Lipid Panel     Component Value Date/Time   CHOL 165 09/18/2020 0803   TRIG 83.0 09/18/2020 0803   HDL 46.10 09/18/2020 0803   CHOLHDL 4 09/18/2020 0803   VLDL 16.6 09/18/2020 0803   LDLCALC 102 (H) 09/18/2020 0803   LDLDIRECT 23.0 02/25/2021 0734   Notes her husband has been ill and in the hospital over the past 2 weeks. They are still trying to figure out if he will get a pacemaker. She notes lots of stress from this but no anxiety or depression at this time.    Social History   Tobacco Use  Smoking Status Never  Smokeless Tobacco Never    Current Outpatient Medications on File  Prior to Visit  Medication Sig Dispense Refill   acetaminophen (TYLENOL) 500 MG tablet Take 500 mg by mouth every 6 (six) hours as needed for headache.     amLODipine (NORVASC) 5 MG tablet TAKE 1 TABLET BY MOUTH ONCE DAILY 90 tablet 3   aspirin EC 81 MG tablet Take 1 tablet (81 mg total) by mouth daily.     Blood Pressure Monitoring (BLOOD PRESSURE KIT) DEVI Check blood pressure daily, brand at patient and insurance preference, dx code I10 1 each 0   Evolocumab (REPATHA SURECLICK) 623 MG/ML SOAJ Inject 140 mg into the skin every 14 (fourteen) days. 6 mL 1   ezetimibe (ZETIA) 10 MG tablet Take 1 tablet (10 mg total) by mouth daily. 90 tablet 3   guaiFENesin-codeine (ROBITUSSIN AC) 100-10 MG/5ML syrup Take 5 mLs by mouth at bedtime as needed for cough. 180 mL 0   isosorbide mononitrate (IMDUR) 30 MG 24 hr tablet TAKE 1 TABLET BY MOUTH ONCE DAILY 90 tablet 1   losartan (COZAAR) 25 MG tablet Take by mouth daily.     metoprolol succinate (TOPROL-XL) 50 MG 24 hr tablet TAKE 1 TABLET BY MOUTH ONCE DAILY 90 tablet 1   nitroGLYCERIN (NITROSTAT) 0.4 MG SL tablet Place 1 tablet (0.4 mg total) under the  tongue every 5 (five) minutes as needed for chest pain. Take for up to 3 doses and then call 911 if still having chest pain. 50 tablet 3   ondansetron (ZOFRAN) 4 MG tablet Take 1 tablet (4 mg total) by mouth every 8 (eight) hours as needed for up to 10 doses for nausea or vomiting. 10 tablet 0   pantoprazole (PROTONIX) 40 MG tablet Take 1 tablet (40 mg total) by mouth daily. 30 tablet 11   predniSONE (STERAPRED UNI-PAK 21 TAB) 10 MG (21) TBPK tablet PO: Take 6 tablets on day 1:Take 5 tablets day 2:Take 4 tablets day 3: Take 3 tablets day 4:Take 2 tablets day five: 5 Take 1 tablet day 6 21 tablet 0   rosuvastatin (CRESTOR) 40 MG tablet TAKE 1 TABLET BY MOUTH ONCE DAILY 90 tablet 1   No current facility-administered medications on file prior to visit.     ROS see history of present  illness  Objective  Physical Exam Vitals:   07/07/21 0803 07/07/21 0812  BP: 140/70 130/70  Pulse: 81   Temp: 98.8 F (37.1 C)   SpO2: 98%     BP Readings from Last 3 Encounters:  07/07/21 130/70  05/06/21 132/87  03/06/21 (!) 193/91   Wt Readings from Last 3 Encounters:  07/07/21 167 lb 6.4 oz (75.9 kg)  05/06/21 165 lb 4 oz (75 kg)  03/06/21 168 lb (76.2 kg)    Physical Exam Constitutional:      General: She is not in acute distress.    Appearance: She is not diaphoretic.  Cardiovascular:     Rate and Rhythm: Normal rate and regular rhythm.     Heart sounds: Normal heart sounds.  Pulmonary:     Effort: Pulmonary effort is normal.     Breath sounds: Normal breath sounds.  Musculoskeletal:     Right lower leg: No edema.     Left lower leg: No edema.  Skin:    General: Skin is warm and dry.  Neurological:     Mental Status: She is alert.     Assessment/Plan: Please see individual problem list.  Problem List Items Addressed This Visit     Barrett's esophagus    Most recent EGD did reveal Barrett's esophagus.  She will continue on Protonix 40 mg daily.  She will continue to follow with GI.      CAD (coronary artery disease)    Continue risk factor management.      CKD (chronic kidney disease), stage III ([place])    Checking kidney function today.      GERD (gastroesophageal reflux disease)    Asymptomatic on Protonix.  She will continue Protonix 40 mg daily.  She will continue to see GI.      Hyperlipidemia    She will continue Repatha 140 mg every 14 days, Zetia 10 mg daily, and Crestor 40 mg daily.  Check LDL.      Relevant Orders   Comp Met (CMET)   Direct LDL   Hypertension    Generally well controlled at home.  She will continue amlodipine 5 mg once daily, Imdur 30 mg daily, losartan 25 mg daily, and metoprolol 50 mg daily.      Stress    Related to husband's recent illness.  Offered support.  Advised to let us know if she develops  depression or anxiety.        Health Maintenance: The patient will get her Shingrix vaccine at the pharmacy.  I  encouraged her to get the updated COVID booster.  Return in about 6 months (around 01/04/2022) for Hypertension/hyperlipidemia.  This visit occurred during the SARS-CoV-2 public health emergency.  Safety protocols were in place, including screening questions prior to the visit, additional usage of staff PPE, and extensive cleaning of exam room while observing appropriate contact time as indicated for disinfecting solutions.    Tommi Rumps, MD [place]

## 2021-07-07 NOTE — Assessment & Plan Note (Signed)
Related to husband's recent illness.  Offered support.  Advised to let us know if she develops depression or anxiety.

## 2021-07-07 NOTE — Assessment & Plan Note (Signed)
Checking kidney function today.

## 2021-07-07 NOTE — Assessment & Plan Note (Signed)
Generally well controlled at home.  She will continue amlodipine 5 mg once daily, Imdur 30 mg daily, losartan 25 mg daily, and metoprolol 50 mg daily.

## 2021-07-07 NOTE — Patient Instructions (Signed)
Nice to see you. We are to check your LDL cholesterol today. Please monitor your stress and if it worsens or you develop anxiety or depression please let us know.

## 2021-07-07 NOTE — Assessment & Plan Note (Signed)
Most recent EGD did reveal Barrett's esophagus.  She will continue on Protonix 40 mg daily.  She will continue to follow with GI.

## 2021-07-07 NOTE — Assessment & Plan Note (Signed)
Asymptomatic on Protonix.  She will continue Protonix 40 mg daily.  She will continue to see GI.

## 2021-07-07 NOTE — Assessment & Plan Note (Signed)
Continue risk factor management. 

## 2021-07-08 LAB — COMPREHENSIVE METABOLIC PANEL
ALT: 17 U/L (ref 0–35)
AST: 20 U/L (ref 0–37)
Albumin: 4.4 g/dL (ref 3.5–5.2)
Alkaline Phosphatase: 51 U/L (ref 39–117)
BUN: 27 mg/dL — ABNORMAL HIGH (ref 6–23)
CO2: 28 mEq/L (ref 19–32)
Calcium: 9.5 mg/dL (ref 8.4–10.5)
Chloride: 107 mEq/L (ref 96–112)
Creatinine, Ser: 1.54 mg/dL — ABNORMAL HIGH (ref 0.40–1.20)
GFR: 33.26 mL/min — ABNORMAL LOW (ref >60.00)
Glucose, Bld: 104 mg/dL — ABNORMAL HIGH (ref 70–99)
Potassium: 5.3 mEq/L — ABNORMAL HIGH (ref 3.5–5.1)
Sodium: 139 mEq/L (ref 135–145)
Total Bilirubin: 0.4 mg/dL (ref 0.2–1.2)
Total Protein: 6.9 g/dL (ref 6.0–8.3)

## 2021-07-08 LAB — LDL CHOLESTEROL, DIRECT: Direct LDL: 45 mg/dL

## 2021-07-09 ENCOUNTER — Encounter: Payer: Self-pay | Admitting: Family Medicine

## 2021-07-10 ENCOUNTER — Other Ambulatory Visit: Payer: Self-pay | Admitting: Family Medicine

## 2021-07-10 DIAGNOSIS — E875 Hyperkalemia: Secondary | ICD-10-CM

## 2021-07-15 ENCOUNTER — Other Ambulatory Visit: Payer: Self-pay

## 2021-07-15 DIAGNOSIS — I10 Essential (primary) hypertension: Secondary | ICD-10-CM

## 2021-07-18 ENCOUNTER — Other Ambulatory Visit: Payer: Self-pay

## 2021-07-18 ENCOUNTER — Other Ambulatory Visit (INDEPENDENT_AMBULATORY_CARE_PROVIDER_SITE_OTHER): Payer: Medicare Other

## 2021-07-18 DIAGNOSIS — E875 Hyperkalemia: Secondary | ICD-10-CM | POA: Diagnosis not present

## 2021-07-18 NOTE — Addendum Note (Signed)
Addended by: Leeanne Rio on: 07/18/2021 02:27 PM   Modules accepted: Orders

## 2021-07-19 LAB — BASIC METABOLIC PANEL
BUN/Creatinine Ratio: 20 (ref 12–28)
BUN: 30 mg/dL — ABNORMAL HIGH (ref 8–27)
CO2: 23 mmol/L (ref 20–29)
Calcium: 9.3 mg/dL (ref 8.7–10.3)
Chloride: 103 mmol/L (ref 96–106)
Creatinine, Ser: 1.49 mg/dL — ABNORMAL HIGH (ref 0.57–1.00)
Glucose: 100 mg/dL — ABNORMAL HIGH (ref 70–99)
Potassium: 5.7 mmol/L — ABNORMAL HIGH (ref 3.5–5.2)
Sodium: 140 mmol/L (ref 134–144)
eGFR: 37 mL/min/{1.73_m2} — ABNORMAL LOW (ref >59)

## 2021-07-21 ENCOUNTER — Other Ambulatory Visit: Payer: Self-pay | Admitting: Family Medicine

## 2021-07-21 ENCOUNTER — Other Ambulatory Visit: Payer: Self-pay

## 2021-07-21 DIAGNOSIS — E875 Hyperkalemia: Secondary | ICD-10-CM

## 2021-07-23 DIAGNOSIS — Z20822 Contact with and (suspected) exposure to covid-19: Secondary | ICD-10-CM | POA: Diagnosis not present

## 2021-07-24 ENCOUNTER — Other Ambulatory Visit: Payer: Self-pay

## 2021-07-24 ENCOUNTER — Other Ambulatory Visit (INDEPENDENT_AMBULATORY_CARE_PROVIDER_SITE_OTHER): Payer: Medicare Other

## 2021-07-24 DIAGNOSIS — E875 Hyperkalemia: Secondary | ICD-10-CM | POA: Diagnosis not present

## 2021-07-24 LAB — POTASSIUM: Potassium: 5 mEq/L (ref 3.5–5.1)

## 2021-08-19 ENCOUNTER — Encounter: Payer: Self-pay | Admitting: Family Medicine

## 2021-08-19 DIAGNOSIS — I2510 Atherosclerotic heart disease of native coronary artery without angina pectoris: Secondary | ICD-10-CM

## 2021-08-19 MED ORDER — NITROGLYCERIN 0.4 MG SL SUBL: 0.4000 mg | SUBLINGUAL_TABLET | SUBLINGUAL | 3 refills | Status: DC | PRN

## 2021-09-02 ENCOUNTER — Other Ambulatory Visit: Payer: Self-pay | Admitting: Family Medicine

## 2021-09-30 DIAGNOSIS — N1832 Chronic kidney disease, stage 3b: Secondary | ICD-10-CM | POA: Diagnosis not present

## 2021-09-30 DIAGNOSIS — D631 Anemia in chronic kidney disease: Secondary | ICD-10-CM | POA: Diagnosis not present

## 2021-09-30 DIAGNOSIS — R809 Proteinuria, unspecified: Secondary | ICD-10-CM | POA: Diagnosis not present

## 2021-09-30 DIAGNOSIS — I10 Essential (primary) hypertension: Secondary | ICD-10-CM | POA: Diagnosis not present

## 2021-10-11 ENCOUNTER — Other Ambulatory Visit: Payer: Self-pay | Admitting: Family Medicine

## 2021-10-11 DIAGNOSIS — E7800 Pure hypercholesterolemia, unspecified: Secondary | ICD-10-CM

## 2021-10-16 ENCOUNTER — Encounter: Payer: Self-pay | Admitting: Family Medicine

## 2021-10-17 NOTE — Telephone Encounter (Signed)
FYI ::  S/w pt - stated she has not been feeling well for a few days. Unsure if allergy related or possible cold. Asking for Dr Caryl Bis to send in rx for allergy medication. Pt advised we need to see her in order to send something. No slots available today, pt advised needs to be evaluated at Urgent Care.  Pt gave verbal understanding and agreed. Will go to urgent care.

## 2021-11-06 ENCOUNTER — Encounter: Payer: Self-pay | Admitting: Family Medicine

## 2021-11-10 ENCOUNTER — Ambulatory Visit: Payer: Medicare Other | Admitting: Family Medicine

## 2021-11-10 ENCOUNTER — Encounter: Payer: Self-pay | Admitting: Family Medicine

## 2021-11-10 ENCOUNTER — Ambulatory Visit (INDEPENDENT_AMBULATORY_CARE_PROVIDER_SITE_OTHER): Payer: Medicare Other | Admitting: Family Medicine

## 2021-11-10 VITALS — BP 140/100 | HR 74 | Temp 98.2°F | Resp 14 | Ht 62.0 in | Wt 173.4 lb

## 2021-11-10 DIAGNOSIS — I10 Essential (primary) hypertension: Secondary | ICD-10-CM | POA: Diagnosis not present

## 2021-11-10 DIAGNOSIS — R309 Painful micturition, unspecified: Secondary | ICD-10-CM | POA: Diagnosis not present

## 2021-11-10 DIAGNOSIS — R809 Proteinuria, unspecified: Secondary | ICD-10-CM | POA: Diagnosis not present

## 2021-11-10 DIAGNOSIS — J3489 Other specified disorders of nose and nasal sinuses: Secondary | ICD-10-CM | POA: Diagnosis not present

## 2021-11-10 DIAGNOSIS — I2510 Atherosclerotic heart disease of native coronary artery without angina pectoris: Secondary | ICD-10-CM | POA: Diagnosis not present

## 2021-11-10 LAB — POCT URINALYSIS DIPSTICK
Glucose, UA: NEGATIVE
Ketones, UA: NEGATIVE
Nitrite, UA: NEGATIVE
Protein, UA: POSITIVE — AB
Spec Grav, UA: >=1.03 — AB (ref 1.010–1.025)
Urobilinogen, UA: 0.2 E.U./dL
pH, UA: 6 (ref 5.0–8.0)

## 2021-11-10 MED ORDER — SULFAMETHOXAZOLE-TRIMETHOPRIM 800-160 MG PO TABS: 1.0000 | ORAL_TABLET | Freq: Two times a day (BID) | ORAL | 0 refills | Status: DC

## 2021-11-10 NOTE — Assessment & Plan Note (Signed)
Well-controlled at home.  She will continue Imdur 30 mg daily, metoprolol 50 mg daily, amlodipine 5 mg daily.  She will continue to monitor at home and if it trends up above 140/80 she will let us know.

## 2021-11-10 NOTE — Patient Instructions (Signed)
Nice to see you. We will treat you with Bactrim for your likely UTI and nasal sores.  If the nasal sores do not improve with this please let us know. We will contact you with your urine culture results. Please continue to monitor your blood pressure at home.  If it trends up at home please let us know.

## 2021-11-10 NOTE — Assessment & Plan Note (Signed)
Patient has some small pustules and nasal mucosal irritation that could be indicative of a nasal MRSA infection.  I will treat with Bactrim 1 double strength tablet twice daily for 5 days.  If not improving she will let us know.

## 2021-11-10 NOTE — Assessment & Plan Note (Signed)
Concerning for UTI.  We will send urine for culture and microscopy.  We will treat with Bactrim.  If not improving she will let us know.

## 2021-11-10 NOTE — Progress Notes (Signed)
Eric Sonnenberg, MD Phone: [phone]  Debbie Delacruz is a 76 y.o. female who presents today for same-day visit.  Dysuria: Patient notes onset of symptoms yesterday.  She has dysuria, urinary frequency, and urinary urgency.  No hematuria, abdominal pain, or fevers.  Nasal sores: Patient notes these have been present for a month or so.  She has sores on the inside of her nostrils and on her nasal mucosa.  Notes her husband has seen tiny pus pockets in these areas.  They are painful.  She has not had any foreign objects in her nose.  They have not changed over the last month.  Hypertension: Patient reports her blood pressure has been in the 130s over 70s typically.  Taking amlodipine, metoprolol, and Imdur.  No chest pain or shortness of breath.  Social History   Tobacco Use  Smoking Status Never  Smokeless Tobacco Never    Current Outpatient Medications on File Prior to Visit  Medication Sig Dispense Refill   acetaminophen (TYLENOL) 500 MG tablet Take 500 mg by mouth every 6 (six) hours as needed for headache.     amLODipine (NORVASC) 5 MG tablet TAKE 1 TABLET BY MOUTH ONCE DAILY 90 tablet 3   aspirin EC 81 MG tablet Take 1 tablet (81 mg total) by mouth daily.     Blood Pressure Monitoring (BLOOD PRESSURE KIT) DEVI Check blood pressure daily, brand at patient and insurance preference, dx code I10 1 each 0   ezetimibe (ZETIA) 10 MG tablet Take 1 tablet (10 mg total) by mouth daily. 90 tablet 3   isosorbide mononitrate (IMDUR) 30 MG 24 hr tablet TAKE 1 TABLET BY MOUTH ONCE DAILY 90 tablet 1   metoprolol succinate (TOPROL-XL) 50 MG 24 hr tablet TAKE 1 TABLET BY MOUTH ONCE DAILY 90 tablet 1   nitroGLYCERIN (NITROSTAT) 0.4 MG SL tablet Place 1 tablet (0.4 mg total) under the tongue every 5 (five) minutes as needed for chest pain. Take for up to 3 doses and then call 911 if still having chest pain. 50 tablet 3   pantoprazole (PROTONIX) 40 MG tablet Take 1 tablet (40 mg total) by mouth  daily. 30 tablet 11   REPATHA SURECLICK 140 MG/ML SOAJ INJECT 140 MG INTO THE SKIN EVERY 14 DAYS 6 mL 1   rosuvastatin (CRESTOR) 40 MG tablet TAKE 1 TABLET BY MOUTH ONCE DAILY 90 tablet 1   guaiFENesin-codeine (ROBITUSSIN AC) 100-10 MG/5ML syrup Take 5 mLs by mouth at bedtime as needed for cough. (Patient not taking: Reported on 11/10/2021) 180 mL 0   ondansetron (ZOFRAN) 4 MG tablet Take 1 tablet (4 mg total) by mouth every 8 (eight) hours as needed for up to 10 doses for nausea or vomiting. (Patient not taking: Reported on 11/10/2021) 10 tablet 0   predniSONE (STERAPRED UNI-PAK 21 TAB) 10 MG (21) TBPK tablet PO: Take 6 tablets on day 1:Take 5 tablets day 2:Take 4 tablets day 3: Take 3 tablets day 4:Take 2 tablets day five: 5 Take 1 tablet day 6 (Patient not taking: Reported on 11/10/2021) 21 tablet 0   No current facility-administered medications on file prior to visit.     ROS see history of present illness  Objective  Physical Exam Vitals:   11/10/21 1457 11/10/21 1528  BP: (!) 150/100 (!) 140/100  Pulse: 74 74  Resp: 14   Temp: 98.2 F (36.8 C)   SpO2: 98%     BP Readings from Last 3 Encounters:  11/10/21 (!) 140/100    07/07/21 130/70  05/06/21 132/87   Wt Readings from Last 3 Encounters:  11/10/21 173 lb 6.4 oz (78.7 kg)  07/07/21 167 lb 6.4 oz (75.9 kg)  05/06/21 165 lb 4 oz (75 kg)    Physical Exam Constitutional:      General: She is not in acute distress.    Appearance: She is not diaphoretic.  HENT:     Nose:     Comments: Slight irritation along the edge of her nostrils and nasal mucosa, several small pustules noted as well of her nasal mucosa Cardiovascular:     Rate and Rhythm: Normal rate and regular rhythm.     Heart sounds: Normal heart sounds.  Pulmonary:     Effort: Pulmonary effort is normal.     Breath sounds: Normal breath sounds.  Abdominal:     General: Bowel sounds are normal. There is no distension.     Palpations: Abdomen is soft.      Tenderness: There is no abdominal tenderness.  Skin:    General: Skin is warm and dry.  Neurological:     Mental Status: She is alert.      Assessment/Plan: Please see individual problem list.  Problem List Items Addressed This Visit     Hypertension (Chronic)    Well-controlled at home.  She will continue Imdur 30 mg daily, metoprolol 50 mg daily, amlodipine 5 mg daily.  She will continue to monitor at home and if it trends up above 140/80 she will let us know.      Nasal sore    Patient has some small pustules and nasal mucosal irritation that could be indicative of a nasal MRSA infection.  I will treat with Bactrim 1 double strength tablet twice daily for 5 days.  If not improving she will let us know.      Relevant Medications   sulfamethoxazole-trimethoprim (BACTRIM DS) 800-160 MG tablet   Pain with urination - Primary    Concerning for UTI.  We will send urine for culture and microscopy.  We will treat with Bactrim.  If not improving she will let us know.      Relevant Medications   sulfamethoxazole-trimethoprim (BACTRIM DS) 800-160 MG tablet   Other Relevant Orders   POCT urinalysis dipstick (Completed)   Urine Culture   Urinalysis, Routine w reflex microscopic    Return for As scheduled.   Eric Sonnenberg, MD [facility] [place]  

## 2021-11-11 ENCOUNTER — Encounter: Payer: Self-pay | Admitting: Family Medicine

## 2021-11-11 LAB — URINE CULTURE
MICRO NUMBER:: 13542462
SPECIMEN QUALITY:: ADEQUATE

## 2021-11-11 LAB — PROTEIN / CREATININE RATIO, URINE
Creatinine, Urine: 196 mg/dL (ref 20–275)
Protein/Creat Ratio: 138 mg/g creat (ref 24–184)
Protein/Creatinine Ratio: 0.138 mg/mg creat (ref 0.024–0.184)
Total Protein, Urine: 27 mg/dL — ABNORMAL HIGH (ref 5–24)

## 2021-11-11 LAB — URINALYSIS, MICROSCOPIC ONLY

## 2021-11-21 ENCOUNTER — Other Ambulatory Visit: Payer: Self-pay

## 2021-11-21 ENCOUNTER — Telehealth: Payer: Self-pay | Admitting: Family Medicine

## 2021-11-21 MED ORDER — ROSUVASTATIN CALCIUM 40 MG PO TABS: 40.0000 mg | ORAL_TABLET | Freq: Every day | ORAL | 1 refills | Status: DC

## 2021-11-21 NOTE — Telephone Encounter (Signed)
Patient is requesting a refill on her  rosuvastatin (CRESTOR) 40 MG tablet. Please change pharmacy to [facility] [address]. Please remove [facility].

## 2021-11-21 NOTE — Telephone Encounter (Signed)
I have sent rosuvastatin to [facility] [place] as well as [facility].

## 2021-12-08 ENCOUNTER — Encounter: Payer: Self-pay | Admitting: Family Medicine

## 2021-12-17 ENCOUNTER — Ambulatory Visit: Payer: Medicare Other | Admitting: Family Medicine

## 2021-12-20 NOTE — Progress Notes (Deleted)
[facility] [place]  [phone] [phone]) [phone]  ID: [name] OB: 26-Sep-1945  MR#: [id number]  MCN#:[id number]  Patient Care Team: [name], MD as PCP - General (Family Medicine) [name], MD as PCP - Cardiology (Cardiology) [name], MD (Internal Medicine) [name], MD (Inactive) as Consulting Physician (Cardiothoracic Surgery) [name], MD as Consulting Physician (Cardiology) Lloyd Huger, MD as Consulting Physician (Oncology)  CHIEF COMPLAINT: Iron deficiency anemia, right renal mass.  INTERVAL HISTORY: Patient returns to clinic today for repeat laboratory work, further evaluation, and consideration of additional IV Feraheme.  She currently feels well and is asymptomatic.  She does not complain of any weakness or fatigue.  She has no neurologic complaints. She denies any recent fevers or illnesses. She has a good appetite and denies weight loss.  She denies any chest pain, shortness of breath, cough, or hemoptysis.  She denies any nausea, vomiting, constipation, or diarrhea. She denies any melena or hematochezia. She has no urinary complaints.  Patient offers no specific complaints today.  REVIEW OF SYSTEMS:   Review of Systems  Constitutional: Negative.  Negative for fever, malaise/fatigue and weight loss.  Respiratory: Negative.  Negative for cough and shortness of breath.   Cardiovascular: Negative.  Negative for chest pain and leg swelling.  Gastrointestinal: Negative.  Negative for abdominal pain, blood in stool and melena.  Genitourinary: Negative.  Negative for hematuria.  Musculoskeletal: Negative.  Negative for joint pain.  Skin: Negative.  Negative for rash.  Neurological: Negative.  Negative for dizziness, sensory change, focal weakness, weakness and headaches.  Psychiatric/Behavioral: Negative.  The patient is not nervous/anxious and does not have insomnia.     As per HPI.  Otherwise, a complete review of systems is negative.  PAST MEDICAL HISTORY: Past Medical History:  Diagnosis Date   A-fib ([place])    Anemia    Anginal pain ([place])    Anxiety    Arthritis    CAD (coronary artery disease)    Cancer (HCC)    Carpal tunnel syndrome    bilateral   Cataract    bil removed cataracts   Coronary artery disease    2x stents, Dr. Clayborn Bigness   Depression    Dysrhythmia    Family history of adverse reaction to anesthesia    "brother; S/P lipotripsy in [place]; transferred to [facility]; had to put him on life support for 2 days"   GERD (gastroesophageal reflux disease)    Headache    "weekly" (09/13/2014)   History of blood transfusion 05/2014   "we haven't figured out why I needed it yet" (09/13/2014)   History of hiatal hernia    Hyperglycemia    Hyperlipidemia    Hypertension    Kidney stone    Myocardial infarct ([place])    Myocardial infarct, old 1999   15 years ago   Obesity    PONV (postoperative nausea and vomiting)    Renal mass    S/P CABG x 4 09/18/2014   LIMA to LAD, SVG to PDA-dRCA sequentially, SVG to OM, EVH via right thigh    Sleep apnea    Sleep apnea    "don't use a mask" (09/13/2014)   Unstable angina (HCC)     PAST SURGICAL HISTORY: Past Surgical History:  Procedure Laterality Date   ABDOMINAL HYSTERECTOMY     APPENDECTOMY     BACK SURGERY     BLADDER SURGERY     CARDIAC CATHETERIZATION  06/2014   CARDIAC CATHETERIZATION N/A 06/16/2016   Procedure: Left Heart Cath and Cors/Grafts Angiography;  Surgeon: Burnell Blanks, MD;  Location: [facility];  Service: Cardiovascular;  Laterality: N/A;   CARPAL TUNNEL RELEASE Left    CATARACT EXTRACTION W/ INTRAOCULAR LENS  IMPLANT, BILATERAL     CHOLECYSTECTOMY     COLONOSCOPY     CORONARY ANGIOPLASTY WITH STENT PLACEMENT  1999   [facility] x2 stent   CORONARY ARTERY BYPASS GRAFT N/A 09/18/2014   Procedure: CORONARY ARTERY BYPASS GRAFTING (CABG)times four using LIMA  to  LAD:SVG to  PD and DIST RCA;SVG to OM;, EVH Right thigh;  Surgeon: [name], MD;  Location: [facility];  Service: Open Heart Surgery;  Laterality: N/A;   CYSTOSCOPY W/ RETROGRADES Left 03/15/2020   Procedure: CYSTOSCOPY WITH RETROGRADE PYELOGRAM;  Surgeon: Billey Co, MD;  Location: [facility];  Service: Urology;  Laterality: Left;   CYSTOSCOPY WITH STENT PLACEMENT Left 02/20/2020   Procedure: CYSTOSCOPY WITH STENT PLACEMENT;  Surgeon: Billey Co, MD;  Location: [facility];  Service: Urology;  Laterality: Left;   CYSTOSCOPY/URETEROSCOPY/HOLMIUM LASER/STENT PLACEMENT Left 03/15/2020   Procedure: CYSTOSCOPY/URETEROSCOPY;  Surgeon: Billey Co, MD;  Location: [facility];  Service: Urology;  Laterality: Left;   ESOPHAGOGASTRODUODENOSCOPY N/A 09/17/2014   Procedure: ESOPHAGOGASTRODUODENOSCOPY (EGD);  Surgeon: Inda Castle, MD;  Location: [facility];  Service: Endoscopy;  Laterality: N/A;   heart stents     [place] Right X 3   "last one was in the 1990's; still have hernia there now" (09/13/2014)   [place]  1990's?   LUMBAR SPINE SURGERY     NEPHRECTOMY Right    removal of ovaries     TEE WITHOUT CARDIOVERSION N/A 09/18/2014   Procedure: TRANSESOPHAGEAL ECHOCARDIOGRAM (TEE);  Surgeon: [name], MD;  Location: [facility];  Service: Open Heart Surgery;  Laterality: N/A;   UPPER GASTROINTESTINAL ENDOSCOPY      FAMILY HISTORY: Family History  Problem Relation Age of Onset   Coronary artery disease Mother    Osteoporosis Mother    Heart disease Mother    Stroke Mother    Heart attack Mother    Hypertension Mother    Hypertension Father    Coronary artery disease Father    COPD Father    Heart disease Father    Heart attack Father    Glaucoma Father    Osteoporosis Father    Emphysema Father    Cancer Sister 41       colon   Hypertension Sister     Colon cancer Sister        dx in her 10's   Breast cancer Sister    Colon polyps Sister    Stroke Brother    Hypertension Brother    Colon polyps Brother    Stroke Maternal Grandfather    Stroke Maternal Grandmother    Osteoporosis Maternal Grandmother    Hypertension Maternal Grandmother    Hypertension Paternal Grandmother    Hypertension Paternal Grandfather    Breast cancer Maternal Aunt        2 aunts-50's   Esophageal cancer Neg Hx    Stomach cancer Neg Hx    Rectal cancer Neg Hx    Pancreatic cancer Neg Hx     ADVANCED DIRECTIVES (Y/N):  N  HEALTH MAINTENANCE:  Social History   Tobacco Use   Smoking status: Never   Smokeless tobacco: Never  Vaping Use   Vaping Use: Never used  Substance Use Topics   Alcohol use: Not Currently    Alcohol/week: 0.0 standard drinks of alcohol   Drug use: No     Colonoscopy:  PAP:  Bone density:  Lipid panel:  Allergies  Allergen Reactions   Ferrous Fumarate Other (See Comments)    STOMACH ISSUES STOMACH ISSUES STOMACH ISSUES   Lipitor [Atorvastatin] Other (See Comments)    myalgia    Current Outpatient Medications  Medication Sig Dispense Refill   acetaminophen (TYLENOL) 500 MG tablet Take 500 mg by mouth every 6 (six) hours as needed for headache.     amLODipine (NORVASC) 5 MG tablet TAKE 1 TABLET BY MOUTH ONCE DAILY 90 tablet 3   aspirin EC 81 MG tablet Take 1 tablet (81 mg total) by mouth daily.     Blood Pressure Monitoring (BLOOD PRESSURE KIT) DEVI Check blood pressure daily, brand at patient and insurance preference, dx code I10 1 each 0   ezetimibe (ZETIA) 10 MG tablet Take 1 tablet (10 mg total) by mouth daily. 90 tablet 3   guaiFENesin-codeine (ROBITUSSIN AC) 100-10 MG/5ML syrup Take 5 mLs by mouth at bedtime as needed for cough. (Patient not taking: Reported on 11/10/2021) 180 mL 0   isosorbide mononitrate (IMDUR) 30 MG 24 hr tablet TAKE 1 TABLET BY MOUTH ONCE DAILY 90 tablet 1   metoprolol succinate  (TOPROL-XL) 50 MG 24 hr tablet TAKE 1 TABLET BY MOUTH ONCE DAILY 90 tablet 1   nitroGLYCERIN (NITROSTAT) 0.4 MG SL tablet Place 1 tablet (0.4 mg total) under the tongue every 5 (five) minutes as needed for chest pain. Take for up to 3 doses and then call 911 if still having chest pain. 50 tablet 3   ondansetron (ZOFRAN) 4 MG tablet Take 1 tablet (4 mg total) by mouth every 8 (eight) hours as needed for up to 10 doses for nausea or vomiting. (Patient not taking: Reported on 11/10/2021) 10 tablet 0   pantoprazole (PROTONIX) 40 MG tablet Take 1 tablet (40 mg total) by mouth daily. 30 tablet 11   predniSONE (STERAPRED UNI-PAK 21 TAB) 10 MG (21) TBPK tablet PO: Take 6 tablets on day 1:Take 5 tablets day 2:Take 4 tablets day 3: Take 3 tablets day 4:Take 2 tablets day five: 5 Take 1 tablet day 6 (Patient not taking: Reported on 11/10/2021) 21 tablet 0   REPATHA SURECLICK 140 MG/ML SOAJ INJECT 140 MG INTO THE SKIN EVERY 14 DAYS 6 mL 1   rosuvastatin (CRESTOR) 40 MG tablet Take 1 tablet (40 mg total) by mouth daily. 90 tablet 1   sulfamethoxazole-trimethoprim (BACTRIM DS) 800-160 MG tablet Take 1 tablet by mouth 2 (two) times daily. 10 tablet 0   No current facility-administered medications for this visit.    OBJECTIVE: There were no vitals filed for this visit.    There is no height or weight on file to calculate BMI.    ECOG FS:0 - Asymptomatic  General: Well-developed, well-nourished, no acute distress. Eyes: Pink conjunctiva, anicteric sclera. HEENT: Normocephalic, moist mucous membranes. Lungs: No audible wheezing or coughing. Heart: Regular rate and rhythm. Abdomen: Soft, nontender, no obvious distention. Musculoskeletal: No edema, cyanosis, or clubbing. Neuro: Alert, answering all questions appropriately. Cranial nerves grossly intact. Skin: No rashes or petechiae noted. Psych: Normal affect.   LAB RESULTS:  Lab Results  Component Value Date  NA 140 07/18/2021   K 5.0 07/24/2021   CL  103 07/18/2021   CO2 23 07/18/2021   GLUCOSE 100 (H) 07/18/2021   BUN 30 (H) 07/18/2021   CREATININE 1.49 (H) 07/18/2021   CALCIUM 9.3 07/18/2021   PROT 6.9 07/07/2021   ALBUMIN 4.4 07/07/2021   AST 20 07/07/2021   ALT 17 07/07/2021   ALKPHOS 51 07/07/2021   BILITOT 0.4 07/07/2021   GFRNONAA 51 (L) 10/04/2020   GFRAA >60 02/24/2020    Lab Results  Component Value Date   WBC 5.4 06/23/2021   NEUTROABS 3.2 06/23/2021   HGB 14.0 06/23/2021   HCT 43.4 06/23/2021   MCV 95.2 06/23/2021   PLT 187 06/23/2021   Lab Results  Component Value Date   IRON 53 06/23/2021   TIBC 393 06/23/2021   IRONPCTSAT 14 06/23/2021   Lab Results  Component Value Date   FERRITIN 15 06/23/2021     STUDIES: No results found.   ASSESSMENT: Iron deficiency anemia.  PLAN:    1. Iron deficiency anemia: Patient reports she cannot tolerate oral iron supplementation.  Her hemoglobin and iron stores have declined.  Previously, all of her other laboratory work was either negative or within normal limits.  Patient had a normal endoscopy in May 2017 and a normal colonoscopy in August 2014.  Patient will likely require repeat colonoscopy in the near future.  Proceed with 510 mg IV Feraheme today.  Return to clinic in 1 week for a second infusion.  Patient then return to clinic in 3 months with repeat laboratory work, further evaluation, and continuation of treatment if needed. 2.  Right renal cell carcinoma: Patient underwent partial nephrectomy on July 04, 2020 at [facility] revealing a stage I malignancy.  Continue follow-up and imaging with them.  I spent a total of 30 minutes reviewing chart data, face-to-face evaluation with the patient, counseling and coordination of care as detailed above.   Patient expressed understanding and was in agreement with this plan. She also understands that She can call clinic at any time with any questions, concerns, or complaints.    Lloyd Huger, MD   12/20/2021  7:56 AM

## 2021-12-23 ENCOUNTER — Telehealth: Payer: Self-pay | Admitting: Oncology

## 2021-12-23 NOTE — Telephone Encounter (Signed)
pt called in to have appt moved to october due to shortage of staff at work

## 2021-12-25 ENCOUNTER — Inpatient Hospital Stay: Payer: Medicare Other

## 2021-12-25 ENCOUNTER — Inpatient Hospital Stay: Payer: Medicare Other | Admitting: Oncology

## 2021-12-25 DIAGNOSIS — D509 Iron deficiency anemia, unspecified: Secondary | ICD-10-CM

## 2022-01-05 ENCOUNTER — Ambulatory Visit: Payer: Medicare Other | Admitting: Family Medicine

## 2022-01-13 ENCOUNTER — Other Ambulatory Visit: Payer: Self-pay

## 2022-01-13 ENCOUNTER — Telehealth: Payer: Self-pay

## 2022-01-13 NOTE — Telephone Encounter (Signed)
I called the pharmacy and informed them that I received a fax with a approval for Repatha from the patient from [id number]/09/2023.  Pharmacy will notify the patient.  Detra Bores,cma

## 2022-01-16 DIAGNOSIS — Z85528 Personal history of other malignant neoplasm of kidney: Secondary | ICD-10-CM | POA: Diagnosis not present

## 2022-01-16 DIAGNOSIS — Z905 Acquired absence of kidney: Secondary | ICD-10-CM | POA: Diagnosis not present

## 2022-01-16 DIAGNOSIS — Z08 Encounter for follow-up examination after completed treatment for malignant neoplasm: Secondary | ICD-10-CM | POA: Diagnosis not present

## 2022-01-16 DIAGNOSIS — E896 Postprocedural adrenocortical (-medullary) hypofunction: Secondary | ICD-10-CM | POA: Diagnosis not present

## 2022-01-27 ENCOUNTER — Telehealth: Payer: Self-pay

## 2022-01-27 NOTE — Telephone Encounter (Signed)
Renee from [facility] on [address] in [place] is calling to state patient is there to get her flu shot and they are also talking about her pneumonia shot.  Renee states patient states she thinks she already received her pneumonia shot from [place] but is wondering if she is due for prevnar.  I spoke with Fulton Mole, CMA, and transferred call to her.

## 2022-02-03 DIAGNOSIS — I10 Essential (primary) hypertension: Secondary | ICD-10-CM | POA: Diagnosis not present

## 2022-02-03 DIAGNOSIS — N1832 Chronic kidney disease, stage 3b: Secondary | ICD-10-CM | POA: Diagnosis not present

## 2022-02-03 DIAGNOSIS — N2581 Secondary hyperparathyroidism of renal origin: Secondary | ICD-10-CM | POA: Diagnosis not present

## 2022-02-04 ENCOUNTER — Encounter: Payer: Self-pay | Admitting: Gastroenterology

## 2022-02-04 ENCOUNTER — Encounter: Payer: Self-pay | Admitting: Family Medicine

## 2022-02-04 DIAGNOSIS — E785 Hyperlipidemia, unspecified: Secondary | ICD-10-CM

## 2022-02-04 MED ORDER — EZETIMIBE 10 MG PO TABS: 10.0000 mg | ORAL_TABLET | Freq: Every day | ORAL | 3 refills | Status: DC

## 2022-02-05 ENCOUNTER — Encounter: Payer: Self-pay | Admitting: Otolaryngology

## 2022-02-05 DIAGNOSIS — J3489 Other specified disorders of nose and nasal sinuses: Secondary | ICD-10-CM | POA: Diagnosis not present

## 2022-02-05 DIAGNOSIS — J301 Allergic rhinitis due to pollen: Secondary | ICD-10-CM | POA: Diagnosis not present

## 2022-02-16 ENCOUNTER — Encounter: Payer: Self-pay | Admitting: Gastroenterology

## 2022-02-24 ENCOUNTER — Inpatient Hospital Stay: Payer: Medicare Other | Attending: Oncology

## 2022-02-24 ENCOUNTER — Telehealth: Payer: Self-pay

## 2022-02-24 ENCOUNTER — Encounter: Payer: Self-pay | Admitting: Oncology

## 2022-02-24 DIAGNOSIS — D631 Anemia in chronic kidney disease: Secondary | ICD-10-CM | POA: Insufficient documentation

## 2022-02-24 DIAGNOSIS — I129 Hypertensive chronic kidney disease with stage 1 through stage 4 chronic kidney disease, or unspecified chronic kidney disease: Secondary | ICD-10-CM | POA: Diagnosis not present

## 2022-02-24 DIAGNOSIS — D509 Iron deficiency anemia, unspecified: Secondary | ICD-10-CM

## 2022-02-24 DIAGNOSIS — C641 Malignant neoplasm of right kidney, except renal pelvis: Secondary | ICD-10-CM | POA: Diagnosis not present

## 2022-02-24 DIAGNOSIS — N1832 Chronic kidney disease, stage 3b: Secondary | ICD-10-CM | POA: Insufficient documentation

## 2022-02-24 LAB — CBC WITH DIFFERENTIAL/PLATELET
Abs Immature Granulocytes: 0.01 10*3/uL (ref 0.00–0.07)
Basophils Absolute: 0 10*3/uL (ref 0.0–0.1)
Basophils Relative: 1 %
Eosinophils Absolute: 0.2 10*3/uL (ref 0.0–0.5)
Eosinophils Relative: 4 %
HCT: 38.6 % (ref 36.0–46.0)
Hemoglobin: 12.2 g/dL (ref 12.0–15.0)
Immature Granulocytes: 0 %
Lymphocytes Relative: 26 %
Lymphs Abs: 1.3 10*3/uL (ref 0.7–4.0)
MCH: 27.5 pg (ref 26.0–34.0)
MCHC: 31.6 g/dL (ref 30.0–36.0)
MCV: 87.1 fL (ref 80.0–100.0)
Monocytes Absolute: 0.8 10*3/uL (ref 0.1–1.0)
Monocytes Relative: 17 %
Neutro Abs: 2.6 10*3/uL (ref 1.7–7.7)
Neutrophils Relative %: 52 %
Platelets: 178 10*3/uL (ref 150–400)
RBC: 4.43 MIL/uL (ref 3.87–5.11)
RDW: 15.1 % (ref 11.5–15.5)
WBC: 5 10*3/uL (ref 4.0–10.5)
nRBC: 0 % (ref 0.0–0.2)

## 2022-02-24 LAB — IRON AND TIBC
Iron: 54 ug/dL (ref 28–170)
Saturation Ratios: 12 % (ref 10.4–31.8)
TIBC: 455 ug/dL — ABNORMAL HIGH (ref 250–450)
UIBC: 401 ug/dL

## 2022-02-24 LAB — FERRITIN: Ferritin: 7 ng/mL — ABNORMAL LOW (ref 11–307)

## 2022-02-24 NOTE — Telephone Encounter (Signed)
Per Dr. Grayland Ormond labs look good and [patient is asystematic ok to cancel patient appt for 02/24/2022. Please push appt back 3 months.   Lab in 3 months APP/Infusion 2 days after. Please notify patient of appt

## 2022-02-24 NOTE — Telephone Encounter (Signed)
*  02/26/2022 

## 2022-02-26 ENCOUNTER — Encounter: Payer: Self-pay | Admitting: Oncology

## 2022-02-26 ENCOUNTER — Inpatient Hospital Stay: Payer: Medicare Other

## 2022-02-26 ENCOUNTER — Inpatient Hospital Stay: Payer: Medicare Other | Admitting: Oncology

## 2022-02-27 ENCOUNTER — Ambulatory Visit (INDEPENDENT_AMBULATORY_CARE_PROVIDER_SITE_OTHER): Payer: Medicare Other

## 2022-02-27 VITALS — Ht 62.0 in | Wt 173.0 lb

## 2022-02-27 DIAGNOSIS — Z0000 Encounter for general adult medical examination without abnormal findings: Secondary | ICD-10-CM | POA: Diagnosis not present

## 2022-02-27 DIAGNOSIS — Z1231 Encounter for screening mammogram for malignant neoplasm of breast: Secondary | ICD-10-CM

## 2022-02-27 NOTE — Progress Notes (Signed)
Subjective:   Debbie Delacruz is a 76 y.o. female who presents for Medicare Annual (Subsequent) preventive examination.  Review of Systems    No ROS.  Medicare Wellness Virtual Visit.  Visual/audio telehealth visit, UTA vital signs.   See social history for additional risk factors.   Cardiac Risk Factors include: advanced age (>54mn, >>25women)     Objective:    Today's Vitals   02/27/22 1106  Weight: 173 lb (78.5 kg)  Height: _0  (1.575 m)   Body mass index is 31.64 kg/m.     02/27/2022   11:31 AM 02/25/2021    9:41 AM 02/14/2021    1:05 PM 10/01/2020    1:20 AM 03/15/2020    1:00 PM 03/12/2020    4:10 PM 02/20/2020   11:00 AM  Advanced Directives  Does Patient Have a Medical Advance Directive? Yes Yes Yes No Yes Yes Yes  Type of AParamed[place]of [place] will [place] EstatesLiving will [place]Living will  [place] will  [place] will  Does patient want to make changes to medical advance directive? No - Patient declined  No - Patient declined  No - Patient declined  No - Patient declined  Copy of [place] Chart? No - copy requested No - copy requested No - copy requested  No - copy requested  No - copy requested  Would patient like information on creating a medical advance directive?   No - Patient declined No - Patient declined No - Patient declined  No - Patient declined    Current Medications (verified) Outpatient Encounter Medications as of 02/27/2022  Medication Sig   acetaminophen (TYLENOL) 500 MG tablet Take 500 mg by mouth every 6 (six) hours as needed for headache.   amLODipine (NORVASC) 5 MG tablet TAKE 1 TABLET BY MOUTH ONCE DAILY   aspirin EC 81 MG tablet Take 1 tablet (81 mg total) by mouth daily.   Blood Pressure Monitoring (BLOOD PRESSURE KIT) DEVI Check blood pressure daily, brand at patient and insurance  preference, dx code I10   ezetimibe (ZETIA) 10 MG tablet Take 1 tablet (10 mg total) by mouth daily.   guaiFENesin-codeine (ROBITUSSIN AC) 100-10 MG/5ML syrup Take 5 mLs by mouth at bedtime as needed for cough. (Patient not taking: Reported on 11/10/2021)   isosorbide mononitrate (IMDUR) 30 MG 24 hr tablet TAKE 1 TABLET BY MOUTH ONCE DAILY   losartan (COZAAR) 100 MG tablet Take 100 mg by mouth daily.   metoprolol succinate (TOPROL-XL) 50 MG 24 hr tablet TAKE 1 TABLET BY MOUTH ONCE DAILY   nitroGLYCERIN (NITROSTAT) 0.4 MG SL tablet Place 1 tablet (0.4 mg total) under the tongue every 5 (five) minutes as needed for chest pain. Take for up to 3 doses and then call 911 if still having chest pain.   ondansetron (ZOFRAN) 4 MG tablet Take 1 tablet (4 mg total) by mouth every 8 (eight) hours as needed for up to 10 doses for nausea or vomiting. (Patient not taking: Reported on 11/10/2021)   pantoprazole (PROTONIX) 40 MG tablet Take 1 tablet (40 mg total) by mouth daily.   predniSONE (STERAPRED UNI-PAK 21 TAB) 10 MG (21) TBPK tablet PO: Take 6 tablets on day 1:Take 5 tablets day 2:Take 4 tablets day 3: Take 3 tablets day 4:Take 2 tablets day five: 5 Take 1 tablet day 6 (Patient not taking: Reported on 11/10/2021)   REPATHA  SURECLICK 809 MG/ML SOAJ INJECT 140 MG INTO THE SKIN EVERY 14 DAYS   rosuvastatin (CRESTOR) 40 MG tablet Take 1 tablet (40 mg total) by mouth daily.   sulfamethoxazole-trimethoprim (BACTRIM DS) 800-160 MG tablet Take 1 tablet by mouth 2 (two) times daily.   No facility-administered encounter medications on file as of 02/27/2022.    Allergies (verified) Ferrous fumarate and Lipitor [atorvastatin]   History: Past Medical History:  Diagnosis Date   A-fib (HCC)    Anemia    Anginal pain (HCC)    Anxiety    Arthritis    CAD (coronary artery disease)    Cancer (HCC)    Carpal tunnel syndrome    bilateral   Cataract    bil removed cataracts   Coronary artery disease    2x stents, Dr.  Clayborn Bigness   Depression    Dysrhythmia    Family history of adverse reaction to anesthesia    "brother; S/P lipotripsy in [place]; transferred to [facility]; had to put him on life support for 2 days"   GERD (gastroesophageal reflux disease)    Headache    "weekly" (09/13/2014)   History of blood transfusion 05/2014   "we haven't figured out why I needed it yet" (09/13/2014)   History of hiatal hernia    Hyperglycemia    Hyperlipidemia    Hypertension    Kidney stone    Myocardial infarct ([place])    Myocardial infarct, old 1999   15 years ago   Obesity    PONV (postoperative nausea and vomiting)    Renal mass    S/P CABG x 4 09/18/2014   LIMA to LAD, SVG to PDA-dRCA sequentially, SVG to OM, EVH via right thigh    Sleep apnea    Sleep apnea    "don't use a mask" (09/13/2014)   Unstable angina [facility])    Past Surgical History:  Procedure Laterality Date   ABDOMINAL HYSTERECTOMY     APPENDECTOMY     BACK SURGERY     BLADDER SURGERY     CARDIAC CATHETERIZATION  06/2014   CARDIAC CATHETERIZATION N/A 06/16/2016   Procedure: Left Heart Cath and Cors/Grafts Angiography;  Surgeon: Burnell Blanks, MD;  Location: [place] [facility];  Service: Cardiovascular;  Laterality: N/A;   CARPAL TUNNEL RELEASE Left    CATARACT EXTRACTION W/ INTRAOCULAR LENS  IMPLANT, BILATERAL     CHOLECYSTECTOMY     COLONOSCOPY     CORONARY ANGIOPLASTY WITH STENT PLACEMENT  1999   [facility] x2 stent   CORONARY ARTERY BYPASS GRAFT N/A 09/18/2014   Procedure: CORONARY ARTERY BYPASS GRAFTING (CABG)times four using LIMA  to LAD:SVG to  PD and DIST RCA;SVG to OM;, EVH Right thigh;  Surgeon: [name], MD;  Location: [place];  Service: Open Heart Surgery;  Laterality: N/A;   CYSTOSCOPY W/ RETROGRADES Left 03/15/2020   Procedure: CYSTOSCOPY WITH RETROGRADE PYELOGRAM;  Surgeon: Billey Co, MD;  Location: [facility];  Service: Urology;  Laterality: Left;   CYSTOSCOPY WITH STENT PLACEMENT Left 02/20/2020    Procedure: CYSTOSCOPY WITH STENT PLACEMENT;  Surgeon: Billey Co, MD;  Location: [facility];  Service: Urology;  Laterality: Left;   CYSTOSCOPY/URETEROSCOPY/HOLMIUM LASER/STENT PLACEMENT Left 03/15/2020   Procedure: CYSTOSCOPY/URETEROSCOPY;  Surgeon: Billey Co, MD;  Location: [facility];  Service: Urology;  Laterality: Left;   ESOPHAGOGASTRODUODENOSCOPY N/A 09/17/2014   Procedure: ESOPHAGOGASTRODUODENOSCOPY (EGD);  Surgeon: Inda Castle, MD;  Location: [place];  Service: Endoscopy;  Laterality:  N/A;   heart stents     [place] Right X 3   "last one was in the 1990's; still have hernia there now" (09/13/2014)   [place]  1990's?   LUMBAR SPINE SURGERY     NEPHRECTOMY Right    removal of ovaries     TEE WITHOUT CARDIOVERSION N/A 09/18/2014   Procedure: TRANSESOPHAGEAL ECHOCARDIOGRAM (TEE);  Surgeon: [name], MD;  Location: [place];  Service: Open Heart Surgery;  Laterality: N/A;   UPPER GASTROINTESTINAL ENDOSCOPY     Family History  Problem Relation Age of Onset   Coronary artery disease Mother    Osteoporosis Mother    Heart disease Mother    Stroke Mother    Heart attack Mother    Hypertension Mother    Hypertension Father    Coronary artery disease Father    COPD Father    Heart disease Father    Heart attack Father    Glaucoma Father    Osteoporosis Father    Emphysema Father    Cancer Sister 44       colon   Hypertension Sister    Colon cancer Sister        dx in her 49's   Breast cancer Sister    Colon polyps Sister    Stroke Brother    Hypertension Brother    Colon polyps Brother    Stroke Maternal Grandfather    Stroke Maternal Grandmother    Osteoporosis Maternal Grandmother    Hypertension Maternal Grandmother    Hypertension Paternal Grandmother    Hypertension Paternal Grandfather    Breast cancer Maternal Aunt         2 aunts-50's   Esophageal cancer Neg Hx    Stomach cancer Neg Hx    Rectal cancer Neg Hx    Pancreatic cancer Neg Hx    Social History   Socioeconomic History   Marital status: Married    Spouse name: Not on file   Number of children: Not on file   Years of education: Not on file   Highest education level: Not on file  Occupational History   Not on file  Tobacco Use   Smoking status: Never   Smokeless tobacco: Never  Vaping Use   Vaping Use: Never used  Substance and Sexual Activity   Alcohol use: Not Currently    Alcohol/week: 0.0 standard drinks of alcohol   Drug use: No   Sexual activity: Not Currently    Birth control/protection: Post-menopausal  Other Topics Concern   Not on file  Social History Narrative   ** Merged History Encounter **       Lives in [place] with 54 YO nephew and husband. 3 dogs outside.  Work - Doctor, general practice, [employer]  Diet - regular, limited meat  Exercise - walks some   Social Determinants of Health   Financial Resource Strain: Low Risk  (02/27/2022)   Overall Financial Resource Strain (CARDIA)    Difficulty of Paying Living Expenses: Not hard at all  Food Insecurity: No Food Insecurity (02/27/2022)   Hunger Vital Sign    Worried About Running Out of Food in the Last Year: Never true    Ran Out of Food in the Last Year: Never true  Transportation Needs: No Transportation Needs (02/27/2022)   PRAPARE - Transportation  Lack of Transportation (Medical): No    Lack of Transportation (Non-Medical): No  Physical Activity: Sufficiently Active (02/27/2022)   Exercise Vital Sign    Days of Exercise per Week: 5 days    Minutes of Exercise per Session: 30 min  Stress: No Stress Concern Present (02/27/2022)   [place]    Feeling of Stress : Not at all  Social Connections: Unknown (02/25/2021)   Social Connection and Isolation Panel [NHANES]    Frequency of  Communication with Friends and Family: More than three times a week    Frequency of Social Gatherings with Friends and Family: Not on file    Attends Religious Services: Not on file    Active Member of Clubs or Organizations: Not on file    Attends Archivist Meetings: Not on file    Marital Status: Not on file    Tobacco Counseling Counseling given: Not Answered   Clinical Intake:  Pre-visit preparation completed: Yes        Diabetes: No  How often do you need to have someone help you when you read instructions, pamphlets, or other written materials from your doctor or pharmacy?: 1 - Never    Interpreter Needed?: No      Activities of Daily Living    02/27/2022   11:02 AM  In your present state of health, do you have any difficulty performing the following activities:  Hearing? 0  Vision? 0  Difficulty concentrating or making decisions? 0  Walking or climbing stairs? 0  Dressing or bathing? 0  Doing errands, shopping? 0  Preparing Food and eating ? N  Using the Toilet? N  In the past six months, have you accidently leaked urine? N  Comment Followed by Urology and Nephrology.  Do you have problems with loss of bowel control? N  Managing your Medications? N  Managing your Finances? N  Housekeeping or managing your Housekeeping? N    Patient Care Team: [name], MD as PCP - General (Family Medicine) [name], MD as PCP - Cardiology (Cardiology) [name], MD (Internal Medicine) [name], MD (Inactive) as Consulting Physician (Cardiothoracic Surgery) [name], MD as Consulting Physician (Cardiology) Lloyd Huger, MD as Consulting Physician (Oncology)  Indicate any recent Medical Services you may have received from other than [facility] providers in the past year (date may be approximate).     Assessment:   This is a routine wellness examination for [facility].  I connected with  [name] on  02/27/22 by a audio enabled telemedicine application and verified that I am speaking with the correct person using two identifiers.  Patient Location: Home  Provider Location: Office/Clinic  I discussed the limitations of evaluation and management by telemedicine. The patient expressed understanding and agreed to proceed.   Hearing/Vision screen Hearing Screening - Comments:: Patient is able to hear conversational tones without difficulty. No issues reported.  Vision Screening - Comments:: Followed by Eye Lab Cataracts extracted, bilateral Wears corrective lenses  They have seen their ophthalmologist in the last 12 months.   Dietary issues and exercise activities discussed: Current Exercise Habits: Home exercise routine, Type of exercise: walking, Time (Minutes): 30, Frequency (Times/Week): 5, Weekly Exercise (Minutes/Week): 150, Intensity: Mild   Goals Addressed             This Visit's Progress    Mmaintain Healthy Lifestyle       Stay active. Healthy diet.  Depression Screen    02/27/2022   11:02 AM 11/10/2021    2:58 PM 07/07/2021    8:08 AM 05/06/2021    8:28 AM 02/25/2021    9:38 AM 01/01/2021    8:06 AM 05/07/2020    8:55 AM  PHQ 2/9 Scores  PHQ - 2 Score 0 0 0 2 0 0 0  PHQ- 9 Score    2       Fall Risk    02/27/2022   11:02 AM 11/10/2021    2:58 PM 07/07/2021    8:05 AM 05/06/2021    8:27 AM 02/25/2021    9:42 AM  Fall Risk   Falls in the past year? 0 0 0 0 0  Number falls in past yr: 0 0 0 0 0  Injury with Fall? 0 0 0 0   Risk for fall due to : No Fall Risks No Fall Risks No Fall Risks No Fall Risks   Follow up _0     FALL RISK PREVENTION PERTAINING TO THE HOME: Home free of loose throw rugs in walkways, pet beds, electrical cords, etc? Yes  Adequate lighting in your home to reduce risk of falls? Yes   ASSISTIVE  DEVICES UTILIZED TO PREVENT FALLS: Life alert? No  Use of a cane, walker or w/c? No   TIMED UP AND GO: Was the test performed? No .   Cognitive Function:  Patient is alert and oriented x3. 100% independent  Manages her own finances and medications.  Employed working for [employer].  Denies difficulty focusing, making decisions, memory loss.       02/27/2022   11:32 AM  6CIT Screen  Months in reverse 0 points    Immunizations Immunization History  Administered Date(s) Administered   Fluad Quad(high Dose 65+) 02/17/2019   Influenza Whole 04/30/2006   Influenza,inj,Quad PF,6+ Mos 03/28/2013, 03/09/2015   Influenza,inj,quad, With Preservative 03/25/2020   Influenza-Unspecified 03/14/2012, 05/04/2020   PFIZER(Purple Top)SARS-COV-2 Vaccination 07/16/2019, 08/09/2019, 05/04/2020   PNEUMOCOCCAL CONJUGATE-20 01/01/2021   Pneumococcal Polysaccharide-23 03/28/2013   Td 04/30/2006   Tdap 02/06/2021   Zoster, Live 05/09/2013   Flu Vaccine status: Due, Education has been provided regarding the importance of this vaccine. Advised may receive this vaccine at local pharmacy or Health Dept. Aware to provide a copy of the vaccination record if obtained from local pharmacy or Health Dept. Verbalized acceptance and understanding.  Covid-19 vaccine status: Completed vaccines x4.  Shingrix Completed?: No.    Education has been provided regarding the importance of this vaccine. Patient has been advised to call insurance company to determine out of pocket expense if they have not yet received this vaccine. Advised may also receive vaccine at local pharmacy or Health Dept. Verbalized acceptance and understanding.  Screening Tests Health Maintenance  Topic Date Due   MAMMOGRAM  10/16/2021   COVID-19 Vaccine (4 - Pfizer risk series) 03/15/2022 (Originally 06/29/2020)   COLONOSCOPY (Pts 45-56yr Insurance coverage will need to be confirmed)  04/24/2022 (Originally 01/14/2022)   Zoster Vaccines-  Shingrix (1 of 2) 05/30/2022 (Originally 11/17/1964)   INFLUENZA VACCINE  08/23/2022 (Originally 12/23/2021)   TETANUS/TDAP  02/07/2031   Pneumonia Vaccine 76 Years old  Completed   DEXA SCAN  Completed   Hepatitis C Screening  Completed   HPV VACCINES  Aged Out   Health Maintenance Health Maintenance Due  Topic Date Due   MAMMOGRAM  10/16/2021  Colonscopy- scheduled 04/2022.  Mammogram- consent given to order. Number provided for patient scheduling. [phone].   Lung Cancer Screening: (Low Dose CT Chest recommended if Age 27-80 years, 30 pack-year currently smoking OR have quit w/in 15years.) does not qualify.   Hepatitis C Screening: Completed 2017  Vision Screening: Recommended annual ophthalmology exams for early detection of glaucoma and other disorders of the eye.  Dental Screening: Recommended annual dental exams for proper oral hygiene  Community Resource Referral / Chronic Care Management: CRR required this visit?  No   CCM required this visit?  No      Plan:     I have personally reviewed and noted the following in the patient's chart:   Medical and social history Use of alcohol, tobacco or illicit drugs  Current medications and supplements including opioid prescriptions. Patient is not currently taking opioid prescriptions. Functional ability and status Nutritional status Physical activity Advanced directives List of other physicians Hospitalizations, surgeries, and ER visits in previous 12 months Vitals Screenings to include cognitive, depression, and falls Referrals and appointments  In addition, I have reviewed and discussed with patient certain preventive protocols, quality metrics, and best practice recommendations. A written personalized care plan for preventive services as well as general preventive health recommendations were provided to patient.     Varney Biles, LPN   [id number]

## 2022-02-27 NOTE — Patient Instructions (Addendum)
[name] , Thank you for taking time to come for your Medicare Wellness Visit. I appreciate your ongoing commitment to your health goals. Please review the following plan we discussed and let me know if I can assist you in the future.   These are the goals we discussed:  Goals      Mmaintain Healthy Lifestyle     Stay active. Healthy diet.        This is a list of the screening recommended for you and due dates:  Health Maintenance  Topic Date Due   Mammogram  10/16/2021   COVID-19 Vaccine (4 - Pfizer risk series) 03/15/2022*   Colon Cancer Screening  04/24/2022*   Zoster (Shingles) Vaccine (1 of 2) 05/30/2022*   Flu Shot  08/23/2022*   Tetanus Vaccine  02/07/2031   Pneumonia Vaccine  Completed   DEXA scan (bone density measurement)  Completed   Hepatitis C Screening: USPSTF Recommendation to screen - Ages 82-79 yo.  Completed   HPV Vaccine  Aged Out  *Topic was postponed. The date shown is not the original due date.    Advanced directives: End of life planning; Advance aging; Advanced directives discussed.  Copy of current HCPOA/Living Will requested to scan on file at her convenience.   Conditions/risks identified: none new  Mammogram- call [phone] for scheduling.   Next appointment: Follow up in one year for your annual wellness visit    Preventive Care 65 Years and Older, Female Preventive care refers to lifestyle choices and visits with your health care provider that can promote health and wellness. What does preventive care include? A yearly physical exam. This is also called an annual well check. Dental exams once or twice a year. Routine eye exams. Ask your health care provider how often you should have your eyes checked. Personal lifestyle choices, including: Daily care of your teeth and gums. Regular physical activity. Eating a healthy diet. Avoiding tobacco and drug use. Limiting alcohol use. Practicing safe sex. Taking low-dose aspirin every  day. Taking vitamin and mineral supplements as recommended by your health care provider. What happens during an annual well check? The services and screenings done by your health care provider during your annual well check will depend on your age, overall health, lifestyle risk factors, and family history of disease. Counseling  Your health care provider may ask you questions about your: Alcohol use. Tobacco use. Drug use. Emotional well-being. Home and relationship well-being. Sexual activity. Eating habits. History of falls. Memory and ability to understand (cognition). Work and work Statistician. Reproductive health. Screening  You may have the following tests or measurements: Height, weight, and BMI. Blood pressure. Lipid and cholesterol levels. These may be checked every 5 years, or more frequently if you are over 29 years old. Skin check. Lung cancer screening. You may have this screening every year starting at age 44 if you have a 30-pack-year history of smoking and currently smoke or have quit within the past 15 years. Fecal occult blood test (FOBT) of the stool. You may have this test every year starting at age 93. Flexible sigmoidoscopy or colonoscopy. You may have a sigmoidoscopy every 5 years or a colonoscopy every 10 years starting at age 21. Hepatitis C blood test. Hepatitis B blood test. Sexually transmitted disease (STD) testing. Diabetes screening. This is done by checking your blood sugar (glucose) after you have not eaten for a while (fasting). You may have this done every 1-3 years. Bone density scan. This is done to  screen for osteoporosis. You may have this done starting at age 16. Mammogram. This may be done every 1-2 years. Talk to your health care provider about how often you should have regular mammograms. Talk with your health care provider about your test results, treatment options, and if necessary, the need for more tests. Vaccines  Your health care  provider may recommend certain vaccines, such as: Influenza vaccine. This is recommended every year. Tetanus, diphtheria, and acellular pertussis (Tdap, Td) vaccine. You may need a Td booster every 10 years. Zoster vaccine. You may need this after age 51. Pneumococcal 13-valent conjugate (PCV13) vaccine. One dose is recommended after age 69. Pneumococcal polysaccharide (PPSV23) vaccine. One dose is recommended after age 2. Talk to your health care provider about which screenings and vaccines you need and how often you need them. This information is not intended to replace advice given to you by your health care provider. Make sure you discuss any questions you have with your health care provider. Document Released: 06/07/2015 Document Revised: 01/29/2016 Document Reviewed: 03/12/2015 Elsevier Interactive Patient Education  2017 [place] Prevention in the Home Falls can cause injuries. They can happen to people of all ages. There are many things you can do to make your home safe and to help prevent falls. What can I do on the outside of my home? Regularly fix the edges of walkways and driveways and fix any cracks. Remove anything that might make you trip as you walk through a door, such as a raised step or threshold. Trim any bushes or trees on the path to your home. Use bright outdoor lighting. Clear any walking paths of anything that might make someone trip, such as rocks or tools. Regularly check to see if handrails are loose or broken. Make sure that both sides of any steps have handrails. Any raised decks and porches should have guardrails on the edges. Have any leaves, snow, or ice cleared regularly. Use sand or salt on walking paths during winter. Clean up any spills in your garage right away. This includes oil or grease spills. What can I do in the bathroom? Use night lights. Install grab bars by the toilet and in the tub and shower. Do not use towel bars as grab  bars. Use non-skid mats or decals in the tub or shower. If you need to sit down in the shower, use a plastic, non-slip stool. Keep the floor dry. Clean up any water that spills on the floor as soon as it happens. Remove soap buildup in the tub or shower regularly. Attach bath mats securely with double-sided non-slip rug tape. Do not have throw rugs and other things on the floor that can make you trip. What can I do in the bedroom? Use night lights. Make sure that you have a light by your bed that is easy to reach. Do not use any sheets or blankets that are too big for your bed. They should not hang down onto the floor. Have a firm chair that has side arms. You can use this for support while you get dressed. Do not have throw rugs and other things on the floor that can make you trip. What can I do in the kitchen? Clean up any spills right away. Avoid walking on wet floors. Keep items that you use a lot in easy-to-reach places. If you need to reach something above you, use a strong step stool that has a grab bar. Keep electrical cords out of the way.  Do not use floor polish or wax that makes floors slippery. If you must use wax, use non-skid floor wax. Do not have throw rugs and other things on the floor that can make you trip. What can I do with my stairs? Do not leave any items on the stairs. Make sure that there are handrails on both sides of the stairs and use them. Fix handrails that are broken or loose. Make sure that handrails are as long as the stairways. Check any carpeting to make sure that it is firmly attached to the stairs. Fix any carpet that is loose or worn. Avoid having throw rugs at the top or bottom of the stairs. If you do have throw rugs, attach them to the floor with carpet tape. Make sure that you have a light switch at the top of the stairs and the bottom of the stairs. If you do not have them, ask someone to add them for you. What else can I do to help prevent  falls? Wear shoes that: Do not have high heels. Have rubber bottoms. Are comfortable and fit you well. Are closed at the toe. Do not wear sandals. If you use a stepladder: Make sure that it is fully opened. Do not climb a closed stepladder. Make sure that both sides of the stepladder are locked into place. Ask someone to hold it for you, if possible. Clearly mark and make sure that you can see: Any grab bars or handrails. First and last steps. Where the edge of each step is. Use tools that help you move around (mobility aids) if they are needed. These include: Canes. Walkers. Scooters. Crutches. Turn on the lights when you go into a dark area. Replace any light bulbs as soon as they burn out. Set up your furniture so you have a clear path. Avoid moving your furniture around. If any of your floors are uneven, fix them. If there are any pets around you, be aware of where they are. Review your medicines with your doctor. Some medicines can make you feel dizzy. This can increase your chance of falling. Ask your doctor what other things that you can do to help prevent falls. This information is not intended to replace advice given to you by your health care provider. Make sure you discuss any questions you have with your health care provider. Document Released: 03/07/2009 Document Revised: 10/17/2015 Document Reviewed: 06/15/2014 Elsevier Interactive Patient Education  2017 [employer].

## 2022-03-05 ENCOUNTER — Ambulatory Visit: Payer: Medicare Other | Admitting: Urology

## 2022-03-12 ENCOUNTER — Ambulatory Visit: Payer: Medicare Other | Admitting: Urology

## 2022-03-17 ENCOUNTER — Other Ambulatory Visit: Payer: Self-pay | Admitting: *Deleted

## 2022-03-17 DIAGNOSIS — N200 Calculus of kidney: Secondary | ICD-10-CM

## 2022-03-18 ENCOUNTER — Ambulatory Visit: Payer: Medicare Other | Admitting: Urology

## 2022-03-26 ENCOUNTER — Encounter: Payer: Medicare Other | Admitting: Gastroenterology

## 2022-04-02 ENCOUNTER — Ambulatory Visit
Admission: RE | Admit: 2022-04-02 | Discharge: 2022-04-02 | Disposition: A | Discharge status: Home / Self Care | Payer: Medicare Other | Source: Ambulatory Visit | Attending: Family Medicine | Admitting: Family Medicine

## 2022-04-02 DIAGNOSIS — Z1231 Encounter for screening mammogram for malignant neoplasm of breast: Secondary | ICD-10-CM | POA: Insufficient documentation

## 2022-04-06 ENCOUNTER — Ambulatory Visit: Payer: Medicare Other | Admitting: Family Medicine

## 2022-04-23 ENCOUNTER — Ambulatory Visit: Payer: Medicare Other | Admitting: Urology

## 2022-05-04 ENCOUNTER — Ambulatory Visit (AMBULATORY_SURGERY_CENTER): Payer: Medicare Other

## 2022-05-04 VITALS — Ht 62.0 in | Wt 181.0 lb

## 2022-05-04 DIAGNOSIS — Z86010 Personal history of colonic polyps: Secondary | ICD-10-CM

## 2022-05-04 MED ORDER — SUTAB 1479-225-188 MG PO TABS: 1.0000 | ORAL_TABLET | ORAL | 0 refills | Status: AC

## 2022-05-04 NOTE — Progress Notes (Signed)
No egg or soy allergy known to patient  No issues known to pt with past sedation with any surgeries or procedures---other than PONV;  Patient denies ever being told they had issues or difficulty with intubation----other than PONV;  No FH of Malignant Hyperthermia Pt is not on diet pills Pt is not on home 02  Pt is not on blood thinners  Pt denies issues with constipation  AFIB dx-- Have any cardiac testing pending--NO Pt instructed to use Singlecare.com or GoodRx for a price reduction on prep   Insurance verified during [facility] appt=Medicare, [employer]  Patient's chart reviewed by Osvaldo Angst CNRA prior to previsit and patient appropriate for the [facility].  Previsit completed and red dot placed by patient's name on their procedure day (on provider's schedule).

## 2022-05-13 ENCOUNTER — Ambulatory Visit: Payer: Medicare Other | Admitting: Urology

## 2022-05-13 ENCOUNTER — Encounter: Payer: Medicare Other | Admitting: Gastroenterology

## 2022-05-20 ENCOUNTER — Telehealth: Payer: Medicare Other | Admitting: Family Medicine

## 2022-05-26 ENCOUNTER — Inpatient Hospital Stay: Payer: Medicare Other

## 2022-05-28 ENCOUNTER — Inpatient Hospital Stay: Payer: Medicare Other | Admitting: Nurse Practitioner

## 2022-05-28 ENCOUNTER — Encounter: Payer: Self-pay | Admitting: Oncology

## 2022-05-28 ENCOUNTER — Inpatient Hospital Stay: Payer: Medicare Other

## 2022-06-01 DIAGNOSIS — Z86010 Personal history of colonic polyps: Secondary | ICD-10-CM | POA: Diagnosis not present

## 2022-06-01 DIAGNOSIS — D123 Benign neoplasm of transverse colon: Secondary | ICD-10-CM | POA: Diagnosis not present

## 2022-06-02 ENCOUNTER — Ambulatory Visit (AMBULATORY_SURGERY_CENTER): Payer: Medicare Other | Admitting: Gastroenterology

## 2022-06-02 ENCOUNTER — Encounter: Payer: Self-pay | Admitting: Oncology

## 2022-06-02 ENCOUNTER — Encounter: Payer: Self-pay | Admitting: Gastroenterology

## 2022-06-02 VITALS — BP 132/64 | HR 67 | Temp 97.5°F | Resp 12 | Ht 62.0 in | Wt 181.0 lb

## 2022-06-02 DIAGNOSIS — D123 Benign neoplasm of transverse colon: Secondary | ICD-10-CM

## 2022-06-02 DIAGNOSIS — Z09 Encounter for follow-up examination after completed treatment for conditions other than malignant neoplasm: Secondary | ICD-10-CM

## 2022-06-02 DIAGNOSIS — Z86010 Personal history of colonic polyps: Secondary | ICD-10-CM

## 2022-06-02 MED ORDER — SODIUM CHLORIDE 0.9 % IV SOLN: 500.0000 mL | INTRAVENOUS | Status: DC

## 2022-06-02 NOTE — Op Note (Signed)
[facility] Patient Name: Debbie Delacruz Procedure Date: 06/02/2022 8:38 AM MRN: [id number] Endoscopist: Ladene Artist , MD, [id number] Age: 77 Referring MD:  Date of Birth: 08-Feb-1946 Gender: Female Account #: [id number] Procedure:                Colonoscopy Indications:              Surveillance: History of numerous (> 10) adenomas                            on last colonoscopy (< 3 yrs) Medicines:                Monitored Anesthesia Care Procedure:                Pre-Anesthesia Assessment:                           - Prior to the procedure, a History and Physical                            was performed, and patient medications and                            allergies were reviewed. The patient's tolerance of                            previous anesthesia was also reviewed. The risks                            and benefits of the procedure and the sedation                            options and risks were discussed with the patient.                            All questions were answered, and informed consent                            was obtained. Prior Anticoagulants: The patient has                            taken no anticoagulant or antiplatelet agents. ASA                            Grade Assessment: II - A patient with mild systemic                            disease. After reviewing the risks and benefits,                            the patient was deemed in satisfactory condition to                            undergo the procedure.  After obtaining informed consent, the colonoscope                            was passed under direct vision. Throughout the                            procedure, the patient's blood pressure, pulse, and                            oxygen saturations were monitored continuously. The                            Olympus CF-HQ190L (Serial# [id number]) Colonoscope was                            introduced through the anus  and advanced to the the                            cecum, identified by appendiceal orifice and                            ileocecal valve. The ileocecal valve, appendiceal                            orifice, and rectum were photographed. The quality                            of the bowel preparation was adequate after                            extensive lavage, suction. The colonoscopy was                            performed without difficulty. The patient tolerated                            the procedure well. Scope In: 8:53:36 AM Scope Out: 9:14:21 AM Scope Withdrawal Time: 0 hours 12 minutes 11 seconds  Total Procedure Duration: 0 hours 20 minutes 45 seconds  Findings:                 The perianal and digital rectal examinations were                            normal.                           Three sessile polyps were found in the transverse                            colon. The polyps were 6 to 7 mm in size. These                            polyps were removed with a cold snare. Resection  and retrieval were complete.                           Medium-mouthed diverticula were found in the                            sigmoid colon, descending colon and transverse                            colon. There was no evidence of diverticular                            bleeding.                           Internal hemorrhoids were found during                            retroflexion. The hemorrhoids were moderate and                            Grade I (internal hemorrhoids that do not prolapse).                           The exam was otherwise without abnormality on                            direct and retroflexion views. Complications:            No immediate complications. Estimated blood loss:                            None. Estimated Blood Loss:     Estimated blood loss: none. Impression:               - Three 6 to 7 mm polyps in the transverse colon,                             removed with a cold snare. Resected and retrieved.                           - Moderate diverticulosis in the left colon.                           - Internal hemorrhoids.                           - The examination was otherwise normal on direct                            and retroflexion views. Recommendation:           - Repeat colonoscopy after studies are complete for                            surveillance based on pathology results with a more  extensive.                           - Patient has a contact number available for                            emergencies. The signs and symptoms of potential                            delayed complications were discussed with the                            patient. Return to normal activities tomorrow.                            Written discharge instructions were provided to the                            patient.                           - High fiber diet.                           - Continue present medications.                           - Await pathology results. Ladene Artist, MD 06/02/2022 9:28:52 AM This report has been signed electronically.

## 2022-06-02 NOTE — Progress Notes (Signed)
Called to room to assist during endoscopic procedure.  Patient ID and intended procedure confirmed with present staff. Received instructions for my participation in the procedure from the performing physician.  

## 2022-06-02 NOTE — Progress Notes (Signed)
Pt's states no medical or surgical changes since previsit or office visit. 

## 2022-06-02 NOTE — Patient Instructions (Signed)
Handouts on polyps, diverticulosis, and hemorrhoids given to you today Await pathology results   YOU HAD AN ENDOSCOPIC PROCEDURE TODAY AT THE  ENDOSCOPY CENTER:   Refer to the procedure report that was given to you for any specific questions about what was found during the examination.  If the procedure report does not answer your questions, please call your gastroenterologist to clarify.  If you requested that your care partner not be given the details of your procedure findings, then the procedure report has been included in a sealed envelope for you to review at your convenience later.  YOU SHOULD EXPECT: Some feelings of bloating in the abdomen. Passage of more gas than usual.  Walking can help get rid of the air that was put into your GI tract during the procedure and reduce the bloating. If you had a lower endoscopy (such as a colonoscopy or flexible sigmoidoscopy) you may notice spotting of blood in your stool or on the toilet paper. If you underwent a bowel prep for your procedure, you may not have a normal bowel movement for a few days.  Please Note:  You might notice some irritation and congestion in your nose or some drainage.  This is from the oxygen used during your procedure.  There is no need for concern and it should clear up in a day or so.  SYMPTOMS TO REPORT IMMEDIATELY:  Following lower endoscopy (colonoscopy or flexible sigmoidoscopy):  Excessive amounts of blood in the stool  Significant tenderness or worsening of abdominal pains  Swelling of the abdomen that is new, acute  Fever of 100F or higher  For urgent or emergent issues, a gastroenterologist can be reached at any hour by calling [phone]. Do not use MyChart messaging for urgent concerns.    DIET:  We do recommend a small meal at first, but then you may proceed to your regular diet.  Drink plenty of fluids but you should avoid alcoholic beverages for 24 hours.  ACTIVITY:  You should plan to take it  easy for the rest of today and you should NOT DRIVE or use heavy machinery until tomorrow (because of the sedation medicines used during the test).    FOLLOW UP: Our staff will call the number listed on your records the next business day following your procedure.  We will call around 7:15- 8:00 am to check on you and address any questions or concerns that you may have regarding the information given to you following your procedure. If we do not reach you, we will leave a message.     If any biopsies were taken you will be contacted by phone or by letter within the next 1-3 weeks.  Please call us at [phone] if you have not heard about the biopsies in 3 weeks.    SIGNATURES/CONFIDENTIALITY: You and/or your care partner have signed paperwork which will be entered into your electronic medical record.  These signatures attest to the fact that that the information above on your After Visit Summary has been reviewed and is understood.  Full responsibility of the confidentiality of this discharge information lies with you and/or your care-partner. 

## 2022-06-02 NOTE — Progress Notes (Signed)
History & Physical  Primary Care Physician:  [name], MD Primary Gastroenterologist: Lucio Edward, MD  CHIEF COMPLAINT:  Personal history of multiple colon polyps   HPI: Debbie Delacruz is a 77 y.o. female with a personal history of 12 tubular adenomas in 2022 for surveillance colonoscopy.   Past Medical History:  Diagnosis Date   A-fib ([place])    Anemia    Anginal pain ([place])    Anxiety    Arthritis    CAD (coronary artery disease)    Cancer (HCC)    Carpal tunnel syndrome    bilateral   Cataract    bil removed cataracts   Coronary artery disease    2x stents, Dr. Clayborn Bigness   Depression    Dysrhythmia    Family history of adverse reaction to anesthesia    "brother; S/P lipotripsy in [place]; transferred to [facility]; had to put him on life support for 2 days"   GERD (gastroesophageal reflux disease)    Headache    "weekly" (09/13/2014)   History of blood transfusion 05/2014   "we haven't figured out why I needed it yet" (09/13/2014)   History of hiatal hernia    Hyperglycemia    Hyperlipidemia    Hypertension    Kidney stone    Myocardial infarct ([place])    Myocardial infarct, old 1999   15 years ago   Obesity    PONV (postoperative nausea and vomiting)    Renal mass    S/P CABG x 4 09/18/2014   LIMA to LAD, SVG to PDA-dRCA sequentially, SVG to OM, EVH via right thigh    Sleep apnea    Sleep apnea    "don't use a mask" (09/13/2014)   Unstable angina [facility])     Past Surgical History:  Procedure Laterality Date   ABDOMINAL HYSTERECTOMY     APPENDECTOMY     BACK SURGERY     BLADDER SURGERY     CARDIAC CATHETERIZATION  06/2014   CARDIAC CATHETERIZATION N/A 06/16/2016   Procedure: Left Heart Cath and Cors/Grafts Angiography;  Surgeon: Burnell Blanks, MD;  Location: [place] [facility];  Service: Cardiovascular;  Laterality: N/A;   CARPAL TUNNEL RELEASE Left    CATARACT EXTRACTION W/ INTRAOCULAR LENS  IMPLANT, BILATERAL      CHOLECYSTECTOMY     COLONOSCOPY  2022   MS-MAC-prep good-tics/multiple frag of TA-1 yr recall   CORONARY ANGIOPLASTY WITH STENT PLACEMENT  1999   [facility] x2 stent   CORONARY ARTERY BYPASS GRAFT N/A 09/18/2014   Procedure: CORONARY ARTERY BYPASS GRAFTING (CABG)times four using LIMA  to LAD:SVG to  PD and DIST RCA;SVG to OM;, EVH Right thigh;  Surgeon: [name], MD;  Location: [place];  Service: Open Heart Surgery;  Laterality: N/A;   CYSTOSCOPY W/ RETROGRADES Left 03/15/2020   Procedure: CYSTOSCOPY WITH RETROGRADE PYELOGRAM;  Surgeon: Billey Co, MD;  Location: [facility];  Service: Urology;  Laterality: Left;   CYSTOSCOPY WITH STENT PLACEMENT Left 02/20/2020   Procedure: CYSTOSCOPY WITH STENT PLACEMENT;  Surgeon: Billey Co, MD;  Location: [facility];  Service: Urology;  Laterality: Left;   CYSTOSCOPY/URETEROSCOPY/HOLMIUM LASER/STENT PLACEMENT Left 03/15/2020   Procedure: CYSTOSCOPY/URETEROSCOPY;  Surgeon: Billey Co, MD;  Location: [facility];  Service: Urology;  Laterality: Left;   ESOPHAGOGASTRODUODENOSCOPY N/A 09/17/2014   Procedure: ESOPHAGOGASTRODUODENOSCOPY (EGD);  Surgeon: Inda Castle, MD;  Location: [place];  Service: Endoscopy;  Laterality: N/A;   heart stents  1999   INCONTINENCE SURGERY     INGUINAL HERNIA REPAIR     INGUINAL HERNIA REPAIR Right X 3   "last one was in the 1990's; still have hernia there now" (09/13/2014)   [place]  1990's?   LUMBAR SPINE SURGERY     NEPHRECTOMY Right    removal of ovaries     TEE WITHOUT CARDIOVERSION N/A 09/18/2014   Procedure: TRANSESOPHAGEAL ECHOCARDIOGRAM (TEE);  Surgeon: [name], MD;  Location: [place];  Service: Open Heart Surgery;  Laterality: N/A;   UPPER GASTROINTESTINAL ENDOSCOPY      Prior to Admission medications   Medication Sig Start Date End Date Taking? Authorizing Provider  acetaminophen (TYLENOL) 500 MG tablet Take 500 mg by mouth every 6 (six)  hours as needed for headache.   Yes [provider]  amLODipine (NORVASC) 5 MG tablet TAKE 1 TABLET BY MOUTH ONCE DAILY 09/02/21  Yes [name], MD  aspirin EC 81 MG tablet Take 1 tablet (81 mg total) by mouth daily. 07/09/16  Yes Deboraha Sprang, MD  Blood Pressure Monitoring (BLOOD PRESSURE KIT) DEVI Check blood pressure daily, brand at patient and insurance preference, dx code I10 02/18/21  Yes [name], MD  Docusate Sodium (DSS) 100 MG CAPS Take 1 capsule by mouth daily at 6 (six) AM.   Yes [provider]  ezetimibe (ZETIA) 10 MG tablet Take 1 tablet (10 mg total) by mouth daily. 02/04/22  Yes [name], MD  isosorbide mononitrate (IMDUR) 30 MG 24 hr tablet TAKE 1 TABLET BY MOUTH ONCE DAILY 09/02/21  Yes [name], MD  losartan (COZAAR) 100 MG tablet Take 100 mg by mouth daily. 11/30/21  Yes [provider]  metoprolol succinate (TOPROL-XL) 50 MG 24 hr tablet TAKE 1 TABLET BY MOUTH ONCE DAILY 06/10/21  Yes [name], MD  pantoprazole (PROTONIX) 40 MG tablet Take 1 tablet (40 mg total) by mouth daily. 01/14/21  Yes Ladene Artist, MD  rosuvastatin (CRESTOR) 40 MG tablet Take 1 tablet (40 mg total) by mouth daily. 11/21/21  Yes [name], MD  Sodium Sulfate-Mag Sulfate-KCl (SUTAB) [id number] MG TABS Take 1 kit by mouth as directed. Patient to bring in coupon 05/04/22  Yes Ladene Artist, MD  gentamicin ointment (GARAMYCIN) 0.1 % Apply 1 Application topically as needed (to nose as needed). Patient not taking: Reported on 06/02/2022 02/05/22   [provider]  nitroGLYCERIN (NITROSTAT) 0.4 MG SL tablet Place 1 tablet (0.4 mg total) under the tongue every 5 (five) minutes as needed for chest pain. Take for up to 3 doses and then call 911 if still having chest pain. 08/19/21   [name], MD  ondansetron (ZOFRAN) 4 MG tablet Take 1 tablet (4 mg total) by mouth every 8 (eight) hours as needed for up to 10 doses  for nausea or vomiting. 02/19/20   Lucrezia Starch, MD    Current Outpatient Medications  Medication Sig Dispense Refill   acetaminophen (TYLENOL) 500 MG tablet Take 500 mg by mouth every 6 (six) hours as needed for headache.     amLODipine (NORVASC) 5 MG tablet TAKE 1 TABLET BY MOUTH ONCE DAILY 90 tablet 3   aspirin EC 81 MG tablet Take 1 tablet (81 mg total) by mouth daily.     Blood Pressure Monitoring (BLOOD PRESSURE KIT) DEVI Check blood pressure daily, brand at patient and insurance preference, dx  code I10 1 each 0   Docusate Sodium (DSS) 100 MG CAPS Take 1 capsule by mouth daily at 6 (six) AM.     ezetimibe (ZETIA) 10 MG tablet Take 1 tablet (10 mg total) by mouth daily. 90 tablet 3   isosorbide mononitrate (IMDUR) 30 MG 24 hr tablet TAKE 1 TABLET BY MOUTH ONCE DAILY 90 tablet 1   losartan (COZAAR) 100 MG tablet Take 100 mg by mouth daily.     metoprolol succinate (TOPROL-XL) 50 MG 24 hr tablet TAKE 1 TABLET BY MOUTH ONCE DAILY 90 tablet 1   pantoprazole (PROTONIX) 40 MG tablet Take 1 tablet (40 mg total) by mouth daily. 30 tablet 11   rosuvastatin (CRESTOR) 40 MG tablet Take 1 tablet (40 mg total) by mouth daily. 90 tablet 1   Sodium Sulfate-Mag Sulfate-KCl (SUTAB) [id number] MG TABS Take 1 kit by mouth as directed. Patient to bring in coupon 24 tablet 0   gentamicin ointment (GARAMYCIN) 0.1 % Apply 1 Application topically as needed (to nose as needed). (Patient not taking: Reported on 06/02/2022)     nitroGLYCERIN (NITROSTAT) 0.4 MG SL tablet Place 1 tablet (0.4 mg total) under the tongue every 5 (five) minutes as needed for chest pain. Take for up to 3 doses and then call 911 if still having chest pain. 50 tablet 3   ondansetron (ZOFRAN) 4 MG tablet Take 1 tablet (4 mg total) by mouth every 8 (eight) hours as needed for up to 10 doses for nausea or vomiting. 10 tablet 0   Current Facility-Administered Medications  Medication Dose Route Frequency Provider Last Rate Last Admin   0.9 %   sodium chloride infusion  500 mL Intravenous Continuous Ladene Artist, MD        Allergies as of 06/02/2022 - Review Complete 06/02/2022  Allergen Reaction Noted   Ferrous fumarate Other (See Comments) 06/15/2016   Lipitor [atorvastatin] Other (See Comments) 11/16/2012    Family History  Problem Relation Age of Onset   Coronary artery disease Mother    Osteoporosis Mother    Heart disease Mother    Stroke Mother    Heart attack Mother    Hypertension Mother    Hypertension Father    Coronary artery disease Father    COPD Father    Heart disease Father    Heart attack Father    Glaucoma Father    Osteoporosis Father    Emphysema Father    Cancer Sister 98       colon   Hypertension Sister    Colon cancer Sister        dx in her 54's   Breast cancer Sister    Colon polyps Sister    Stroke Brother    Hypertension Brother    Colon polyps Brother    Stroke Maternal Grandfather    Stroke Maternal Grandmother    Osteoporosis Maternal Grandmother    Hypertension Maternal Grandmother    Hypertension Paternal Grandmother    Hypertension Paternal Grandfather    Breast cancer Maternal Aunt        2 aunts-50's   Esophageal cancer Neg Hx    Stomach cancer Neg Hx    Rectal cancer Neg Hx    Pancreatic cancer Neg Hx     Social History   Socioeconomic History   Marital status: Married    Spouse name: Not on file   Number of children: Not on file   Years of education: Not on file  Highest education level: Not on file  Occupational History   Not on file  Tobacco Use   Smoking status: Never   Smokeless tobacco: Never  Vaping Use   Vaping Use: Never used  Substance and Sexual Activity   Alcohol use: Not Currently    Alcohol/week: 0.0 standard drinks of alcohol   Drug use: No   Sexual activity: Not Currently    Birth control/protection: Post-menopausal  Other Topics Concern   Not on file  Social History Narrative   ** Merged History Encounter **       Lives in  [place] with 33 YO nephew and husband. 3 dogs outside.  Work - Doctor, general practice, [employer]  Diet - regular, limited meat  Exercise - walks some   Social Determinants of Health   Financial Resource Strain: Low Risk  (02/27/2022)   Overall Financial Resource Strain (CARDIA)    Difficulty of Paying Living Expenses: Not hard at all  Food Insecurity: No Food Insecurity (02/27/2022)   Hunger Vital Sign    Worried About Running Out of Food in the Last Year: Never true    Ran Out of Food in the Last Year: Never true  Transportation Needs: No Transportation Needs (02/27/2022)   PRAPARE - Hydrologist (Medical): No    Lack of Transportation (Non-Medical): No  Physical Activity: Sufficiently Active (02/27/2022)   Exercise Vital Sign    Days of Exercise per Week: 5 days    Minutes of Exercise per Session: 30 min  Stress: No Stress Concern Present (02/27/2022)   [place]    Feeling of Stress : Not at all  Social Connections: Unknown (02/25/2021)   Social Connection and Isolation Panel [NHANES]    Frequency of Communication with Friends and Family: More than three times a week    Frequency of Social Gatherings with Friends and Family: Not on file    Attends Religious Services: Not on file    Active Member of Clubs or Organizations: Not on file    Attends Archivist Meetings: Not on file    Marital Status: Not on file  Intimate Partner Violence: Not At Risk (02/27/2022)   Humiliation, Afraid, Rape, and Kick questionnaire    Fear of Current or Ex-Partner: No    Emotionally Abused: No    Physically Abused: No    Sexually Abused: No    Review of Systems:  All systems reviewed were negative except where noted in HPI.   Physical Exam: General:  Alert, well-developed, in NAD Head:  Normocephalic and atraumatic. Eyes:  Sclera clear, no icterus.   Conjunctiva pink. Ears:  Normal  auditory acuity. Mouth:  No deformity or lesions.  Neck:  Supple; no masses . Lungs:  Clear throughout to auscultation.   No wheezes, crackles, or rhonchi. No acute distress. Heart:  Regular rate and rhythm; no murmurs. Abdomen:  Soft, nondistended, nontender. No masses, hepatomegaly. No obvious masses.  Normal bowel .    Rectal:  Deferred   Msk:  Symmetrical without gross deformities.. Pulses:  Normal pulses noted. Extremities:  Without edema. Neurologic:  Alert and  oriented x4;  grossly normal neurologically. Skin:  Intact without significant lesions or rashes. Psych:  Alert and cooperative. Normal mood and affect.   Impression / Plan:   Personal history of 12 tubular adenomas in August 2022 for surveillance colonoscopy.  Pricilla Riffle. Fuller Plan  06/02/2022, 8:50 AM See Shea Evans, [facility], to  contact our on call provider

## 2022-06-02 NOTE — Progress Notes (Signed)
A and O x3. Report to RN. Tolerated MAC anesthesia well. 

## 2022-06-03 ENCOUNTER — Other Ambulatory Visit: Payer: Self-pay | Admitting: Gastroenterology

## 2022-06-03 ENCOUNTER — Telehealth: Payer: Self-pay | Admitting: *Deleted

## 2022-06-03 DIAGNOSIS — K2270 Barrett's esophagus without dysplasia: Secondary | ICD-10-CM

## 2022-06-03 DIAGNOSIS — K2970 Gastritis, unspecified, without bleeding: Secondary | ICD-10-CM

## 2022-06-03 NOTE — Telephone Encounter (Signed)
Left message on f/u call 

## 2022-06-04 ENCOUNTER — Ambulatory Visit: Payer: Medicare Other | Admitting: Urology

## 2022-06-08 ENCOUNTER — Encounter: Payer: Self-pay | Admitting: Family Medicine

## 2022-06-08 DIAGNOSIS — I2510 Atherosclerotic heart disease of native coronary artery without angina pectoris: Secondary | ICD-10-CM

## 2022-06-08 MED ORDER — NITROGLYCERIN 0.4 MG SL SUBL: 0.4000 mg | SUBLINGUAL_TABLET | SUBLINGUAL | 3 refills | Status: DC | PRN

## 2022-06-09 ENCOUNTER — Ambulatory Visit: Payer: Medicare Other | Admitting: Family Medicine

## 2022-06-11 ENCOUNTER — Encounter: Payer: Self-pay | Admitting: Gastroenterology

## 2022-06-29 ENCOUNTER — Encounter: Payer: Self-pay | Admitting: Nurse Practitioner

## 2022-06-29 ENCOUNTER — Inpatient Hospital Stay: Payer: Medicare Other | Attending: Oncology

## 2022-06-29 DIAGNOSIS — I129 Hypertensive chronic kidney disease with stage 1 through stage 4 chronic kidney disease, or unspecified chronic kidney disease: Secondary | ICD-10-CM | POA: Insufficient documentation

## 2022-06-29 DIAGNOSIS — D509 Iron deficiency anemia, unspecified: Secondary | ICD-10-CM | POA: Insufficient documentation

## 2022-06-29 DIAGNOSIS — E785 Hyperlipidemia, unspecified: Secondary | ICD-10-CM | POA: Insufficient documentation

## 2022-06-29 DIAGNOSIS — R0602 Shortness of breath: Secondary | ICD-10-CM | POA: Insufficient documentation

## 2022-06-29 DIAGNOSIS — I252 Old myocardial infarction: Secondary | ICD-10-CM | POA: Insufficient documentation

## 2022-06-29 DIAGNOSIS — Z85528 Personal history of other malignant neoplasm of kidney: Secondary | ICD-10-CM | POA: Diagnosis not present

## 2022-06-29 DIAGNOSIS — I4891 Unspecified atrial fibrillation: Secondary | ICD-10-CM | POA: Diagnosis not present

## 2022-06-29 DIAGNOSIS — I2510 Atherosclerotic heart disease of native coronary artery without angina pectoris: Secondary | ICD-10-CM | POA: Diagnosis not present

## 2022-06-29 DIAGNOSIS — Z79899 Other long term (current) drug therapy: Secondary | ICD-10-CM | POA: Insufficient documentation

## 2022-06-29 DIAGNOSIS — N1832 Chronic kidney disease, stage 3b: Secondary | ICD-10-CM | POA: Diagnosis not present

## 2022-06-29 DIAGNOSIS — D631 Anemia in chronic kidney disease: Secondary | ICD-10-CM | POA: Insufficient documentation

## 2022-06-29 DIAGNOSIS — R5383 Other fatigue: Secondary | ICD-10-CM | POA: Insufficient documentation

## 2022-06-29 DIAGNOSIS — Z7982 Long term (current) use of aspirin: Secondary | ICD-10-CM | POA: Insufficient documentation

## 2022-06-29 DIAGNOSIS — G4730 Sleep apnea, unspecified: Secondary | ICD-10-CM | POA: Insufficient documentation

## 2022-06-29 DIAGNOSIS — K219 Gastro-esophageal reflux disease without esophagitis: Secondary | ICD-10-CM | POA: Diagnosis not present

## 2022-06-29 LAB — CBC WITH DIFFERENTIAL/PLATELET
Abs Immature Granulocytes: 0.02 10*3/uL (ref 0.00–0.07)
Basophils Absolute: 0 10*3/uL (ref 0.0–0.1)
Basophils Relative: 0 %
Eosinophils Absolute: 0.2 10*3/uL (ref 0.0–0.5)
Eosinophils Relative: 4 %
HCT: 42.1 % (ref 36.0–46.0)
Hemoglobin: 13.6 g/dL (ref 12.0–15.0)
Immature Granulocytes: 0 %
Lymphocytes Relative: 25 %
Lymphs Abs: 1.2 10*3/uL (ref 0.7–4.0)
MCH: 28.3 pg (ref 26.0–34.0)
MCHC: 32.3 g/dL (ref 30.0–36.0)
MCV: 87.5 fL (ref 80.0–100.0)
Monocytes Absolute: 0.4 10*3/uL (ref 0.1–1.0)
Monocytes Relative: 9 %
Neutro Abs: 2.9 10*3/uL (ref 1.7–7.7)
Neutrophils Relative %: 62 %
Platelets: 174 10*3/uL (ref 150–400)
RBC: 4.81 MIL/uL (ref 3.87–5.11)
RDW: 14.5 % (ref 11.5–15.5)
WBC: 4.8 10*3/uL (ref 4.0–10.5)
nRBC: 0 % (ref 0.0–0.2)

## 2022-06-29 LAB — FERRITIN: Ferritin: 6 ng/mL — ABNORMAL LOW (ref 11–307)

## 2022-06-29 LAB — IRON AND TIBC
Iron: 44 ug/dL (ref 28–170)
Saturation Ratios: 10 % — ABNORMAL LOW (ref 10.4–31.8)
TIBC: 458 ug/dL — ABNORMAL HIGH (ref 250–450)
UIBC: 414 ug/dL

## 2022-06-30 DIAGNOSIS — N1832 Chronic kidney disease, stage 3b: Secondary | ICD-10-CM | POA: Diagnosis not present

## 2022-06-30 DIAGNOSIS — N2581 Secondary hyperparathyroidism of renal origin: Secondary | ICD-10-CM | POA: Diagnosis not present

## 2022-06-30 DIAGNOSIS — I10 Essential (primary) hypertension: Secondary | ICD-10-CM | POA: Diagnosis not present

## 2022-07-01 ENCOUNTER — Ambulatory Visit: Payer: Medicare Other | Admitting: Urology

## 2022-07-02 ENCOUNTER — Inpatient Hospital Stay: Payer: Medicare Other

## 2022-07-02 ENCOUNTER — Encounter: Payer: Self-pay | Admitting: Nurse Practitioner

## 2022-07-02 ENCOUNTER — Inpatient Hospital Stay (HOSPITAL_BASED_OUTPATIENT_CLINIC_OR_DEPARTMENT_OTHER): Payer: Medicare Other | Admitting: Nurse Practitioner

## 2022-07-02 VITALS — BP 148/70 | HR 68 | Temp 97.5°F | Resp 18

## 2022-07-02 VITALS — BP 168/94 | HR 68 | Temp 98.7°F | Wt 184.0 lb

## 2022-07-02 DIAGNOSIS — I129 Hypertensive chronic kidney disease with stage 1 through stage 4 chronic kidney disease, or unspecified chronic kidney disease: Secondary | ICD-10-CM | POA: Diagnosis not present

## 2022-07-02 DIAGNOSIS — D509 Iron deficiency anemia, unspecified: Secondary | ICD-10-CM

## 2022-07-02 DIAGNOSIS — N1832 Chronic kidney disease, stage 3b: Secondary | ICD-10-CM

## 2022-07-02 DIAGNOSIS — Z79899 Other long term (current) drug therapy: Secondary | ICD-10-CM | POA: Diagnosis not present

## 2022-07-02 DIAGNOSIS — D508 Other iron deficiency anemias: Secondary | ICD-10-CM

## 2022-07-02 DIAGNOSIS — Z85528 Personal history of other malignant neoplasm of kidney: Secondary | ICD-10-CM | POA: Diagnosis not present

## 2022-07-02 DIAGNOSIS — D631 Anemia in chronic kidney disease: Secondary | ICD-10-CM | POA: Diagnosis not present

## 2022-07-02 MED ORDER — SODIUM CHLORIDE 0.9 % IV SOLN
Freq: Once | INTRAVENOUS | Status: AC
  Filled 2022-07-02: qty 250

## 2022-07-02 MED ORDER — SODIUM CHLORIDE 0.9 % IV SOLN
510.0000 mg | Freq: Once | INTRAVENOUS | Status: AC
  Administered 2022-07-02: 510 mg via INTRAVENOUS
  Filled 2022-07-02: qty 510

## 2022-07-02 NOTE — Progress Notes (Signed)
[place]  [phone] [phone]) [phone]  ID: [name] OB: 08-02-1945  MR#: [id number]  PJK#:[id number]  Patient Care Team: [name], MD as PCP - General (Family Medicine) [name], MD as PCP - Cardiology (Cardiology) [name], MD (Internal Medicine) [name], MD (Inactive) as Consulting Physician (Cardiothoracic Surgery) [name], MD as Consulting Physician (Cardiology) Lloyd Huger, MD as Consulting Physician (Oncology)  CHIEF COMPLAINT: Iron deficiency anemia, right renal mass.  INTERVAL HISTORY: Mrs. Voorheis is a 77 year-old female with iron deficiency anemia who returns to clinic for follow up and consideration of IV iron. She last received IV feraheme 02/14/21. Today, reports increased fatigue and shortness of breath with exertion. Denies black or bloody stools. Is unaware of any blood loss. Denies any neurologic complaints. Denies recent fevers or illnesses. Denies any easy bleeding or bruising. No melena or hematochezia. No pica or restless leg. Reports good appetite and denies weight loss. Denies chest pain. Denies any nausea, vomiting, constipation, or diarrhea. Denies urinary complaints. Patient offers no further specific complaints today. 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   REVIEW OF SYSTEMS:   Review of Systems  Constitutional:  Positive for malaise/fatigue. Negative for fever and weight loss.  Respiratory:  Negative for cough and shortness of breath.   Cardiovascular:  Negative for chest pain and leg swelling.  Gastrointestinal:  Negative for abdominal pain, blood in stool and melena.  Genitourinary:  Negative for hematuria.  Musculoskeletal:  Negative for joint pain.  Skin:  Negative for rash.  Neurological:  Negative for dizziness, sensory change, focal weakness, weakness and headaches.  Psychiatric/Behavioral:  Negative for depression. The patient is not  nervous/anxious and does not have insomnia.   As per HPI. Otherwise, a complete review of systems is negative.  PAST MEDICAL HISTORY: Past Medical History:  Diagnosis Date   A-fib ([place])    Anemia    Anginal pain ([place])    Anxiety    Arthritis    CAD (coronary artery disease)    Cancer (HCC)    Carpal tunnel syndrome    bilateral   Cataract    bil removed cataracts   Coronary artery disease    2x stents, Dr. Clayborn Bigness   Depression    Dysrhythmia    Family history of adverse reaction to anesthesia    "brother; S/P lipotripsy in [place]; transferred to [facility]; had to put him on life support for 2 days"   GERD (gastroesophageal reflux disease)    Headache    "weekly" (09/13/2014)   History of blood transfusion 05/2014   "we haven't figured out why I needed it yet" (09/13/2014)   History of hiatal hernia    Hyperglycemia    Hyperlipidemia    Hypertension    Kidney stone    Myocardial infarct ([place])    Myocardial infarct, old 1999   15 years ago   Obesity    PONV (postoperative nausea and vomiting)    Renal mass    S/P CABG x 4 09/18/2014   LIMA to LAD, SVG to PDA-dRCA sequentially, SVG to OM, EVH via right thigh    Sleep apnea    Sleep apnea    "don't use a mask" (09/13/2014)   Unstable angina ([place])     PAST SURGICAL HISTORY: Past Surgical History:  Procedure Laterality Date   ABDOMINAL HYSTERECTOMY     APPENDECTOMY     BACK SURGERY  BLADDER SURGERY     CARDIAC CATHETERIZATION  06/2014   CARDIAC CATHETERIZATION N/A 06/16/2016   Procedure: Left Heart Cath and Cors/Grafts Angiography;  Surgeon: Burnell Blanks, MD;  Location: [place] [facility];  Service: Cardiovascular;  Laterality: N/A;   CARPAL TUNNEL RELEASE Left    CATARACT EXTRACTION W/ INTRAOCULAR LENS  IMPLANT, BILATERAL     CHOLECYSTECTOMY     COLONOSCOPY  2022   MS-MAC-prep good-tics/multiple frag of TA-1 yr recall   CORONARY ANGIOPLASTY WITH STENT PLACEMENT  1999   [facility] x2 stent    CORONARY ARTERY BYPASS GRAFT N/A 09/18/2014   Procedure: CORONARY ARTERY BYPASS GRAFTING (CABG)times four using LIMA  to LAD:SVG to  PD and DIST RCA;SVG to OM;, EVH Right thigh;  Surgeon: [name], MD;  Location: [place];  Service: Open Heart Surgery;  Laterality: N/A;   CYSTOSCOPY W/ RETROGRADES Left 03/15/2020   Procedure: CYSTOSCOPY WITH RETROGRADE PYELOGRAM;  Surgeon: Billey Co, MD;  Location: [facility];  Service: Urology;  Laterality: Left;   CYSTOSCOPY WITH STENT PLACEMENT Left 02/20/2020   Procedure: CYSTOSCOPY WITH STENT PLACEMENT;  Surgeon: Billey Co, MD;  Location: [facility];  Service: Urology;  Laterality: Left;   CYSTOSCOPY/URETEROSCOPY/HOLMIUM LASER/STENT PLACEMENT Left 03/15/2020   Procedure: CYSTOSCOPY/URETEROSCOPY;  Surgeon: Billey Co, MD;  Location: [facility];  Service: Urology;  Laterality: Left;   ESOPHAGOGASTRODUODENOSCOPY N/A 09/17/2014   Procedure: ESOPHAGOGASTRODUODENOSCOPY (EGD);  Surgeon: Inda Castle, MD;  Location: [place];  Service: Endoscopy;  Laterality: N/A;   heart stents     [place] Right X 3   "last one was in the 1990's; still have hernia there now" (09/13/2014)   [place]  1990's?   LUMBAR SPINE SURGERY     NEPHRECTOMY Right    removal of ovaries     TEE WITHOUT CARDIOVERSION N/A 09/18/2014   Procedure: TRANSESOPHAGEAL ECHOCARDIOGRAM (TEE);  Surgeon: [name], MD;  Location: [place];  Service: Open Heart Surgery;  Laterality: N/A;   UPPER GASTROINTESTINAL ENDOSCOPY      FAMILY HISTORY: Family History  Problem Relation Age of Onset   Coronary artery disease Mother    Osteoporosis Mother    Heart disease Mother    Stroke Mother    Heart attack Mother    Hypertension Mother    Hypertension Father    Coronary artery disease Father    COPD Father    Heart disease Father    Heart attack Father     Glaucoma Father    Osteoporosis Father    Emphysema Father    Cancer Sister 71       colon   Hypertension Sister    Colon cancer Sister        dx in her 74's   Breast cancer Sister    Colon polyps Sister    Stroke Brother    Hypertension Brother    Colon polyps Brother    Stroke Maternal Grandfather    Stroke Maternal Grandmother    Osteoporosis Maternal Grandmother    Hypertension Maternal Grandmother    Hypertension Paternal Grandmother    Hypertension Paternal Grandfather    Breast cancer Maternal Aunt        2 aunts-50's   Esophageal cancer Neg Hx    Stomach cancer Neg Hx    Rectal cancer Neg Hx  Pancreatic cancer Neg Hx     ADVANCED DIRECTIVES (Y/N):  N  HEALTH MAINTENANCE: Social History   Tobacco Use   Smoking status: Never   Smokeless tobacco: Never  Vaping Use   Vaping Use: Never used  Substance Use Topics   Alcohol use: Not Currently    Alcohol/week: 0.0 standard drinks of alcohol   Drug use: No    Colonoscopy:  PAP:  Bone density:  Lipid panel:  Allergies  Allergen Reactions   Ferrous Fumarate Other (See Comments)    STOMACH ISSUES STOMACH ISSUES STOMACH ISSUES   Lipitor [Atorvastatin] Other (See Comments)    myalgia    Current Outpatient Medications  Medication Sig Dispense Refill   acetaminophen (TYLENOL) 500 MG tablet Take 500 mg by mouth every 6 (six) hours as needed for headache.     amLODipine (NORVASC) 5 MG tablet TAKE 1 TABLET BY MOUTH ONCE DAILY 90 tablet 3   aspirin EC 81 MG tablet Take 1 tablet (81 mg total) by mouth daily.     Blood Pressure Monitoring (BLOOD PRESSURE KIT) DEVI Check blood pressure daily, brand at patient and insurance preference, dx code I10 1 each 0   Docusate Sodium (DSS) 100 MG CAPS Take 1 capsule by mouth daily at 6 (six) AM.     ezetimibe (ZETIA) 10 MG tablet Take 1 tablet (10 mg total) by mouth daily. 90 tablet 3   isosorbide mononitrate (IMDUR) 30 MG 24 hr tablet TAKE 1 TABLET BY MOUTH ONCE DAILY 90  tablet 1   losartan (COZAAR) 100 MG tablet Take 100 mg by mouth daily.     metoprolol succinate (TOPROL-XL) 50 MG 24 hr tablet TAKE 1 TABLET BY MOUTH ONCE DAILY 90 tablet 1   nitroGLYCERIN (NITROSTAT) 0.4 MG SL tablet Place 1 tablet (0.4 mg total) under the tongue every 5 (five) minutes as needed for chest pain. Take for up to 3 doses and then call 911 if still having chest pain. 50 tablet 3   ondansetron (ZOFRAN) 4 MG tablet Take 1 tablet (4 mg total) by mouth every 8 (eight) hours as needed for up to 10 doses for nausea or vomiting. 10 tablet 0   pantoprazole (PROTONIX) 40 MG tablet Take 1 tablet by mouth once daily 90 tablet 0   rosuvastatin (CRESTOR) 40 MG tablet Take 1 tablet (40 mg total) by mouth daily. 90 tablet 1   Sodium Sulfate-Mag Sulfate-KCl (SUTAB) [id number] MG TABS Take 1 kit by mouth as directed. Patient to bring in coupon 24 tablet 0   gentamicin ointment (GARAMYCIN) 0.1 % Apply 1 Application topically as needed (to nose as needed). (Patient not taking: Reported on 06/02/2022)     No current facility-administered medications for this visit.    OBJECTIVE: Vitals:   07/02/22 1406  BP: (!) 168/94  Pulse: 68  Temp: 98.7 F (37.1 C)      Body mass index is 33.65 kg/m.    ECOG FS:1 - Symptomatic but completely ambulatory   Physical Exam Constitutional:      Appearance: She is not ill-appearing.  HENT:     Head: Normocephalic and atraumatic.  Cardiovascular:     Rate and Rhythm: Normal rate and regular rhythm.  Pulmonary:     Effort: Pulmonary effort is normal.  Abdominal:     General: There is no distension.     Palpations: Abdomen is soft.     Tenderness: There is no abdominal tenderness.  Musculoskeletal:        General:  No deformity.  Skin:    General: Skin is warm and dry.     Coloration: Skin is not pale.  Neurological:     Mental Status: She is alert and oriented to person, place, and time.  Psychiatric:        Mood and Affect: Mood normal.         Behavior: Behavior normal.    LAB RESULTS: Lab Results  Component Value Date   NA 140 07/18/2021   K 5.0 07/24/2021   CL 103 07/18/2021   CO2 23 07/18/2021   GLUCOSE 100 (H) 07/18/2021   BUN 30 (H) 07/18/2021   CREATININE 1.49 (H) 07/18/2021   CALCIUM 9.3 07/18/2021   PROT 6.9 07/07/2021   ALBUMIN 4.4 07/07/2021   AST 20 07/07/2021   ALT 17 07/07/2021   ALKPHOS 51 07/07/2021   BILITOT 0.4 07/07/2021   GFRNONAA 51 (L) 10/04/2020   GFRAA >60 02/24/2020   Lab Results  Component Value Date   WBC 4.8 06/29/2022   NEUTROABS 2.9 06/29/2022   HGB 13.6 06/29/2022   HCT 42.1 06/29/2022   MCV 87.5 06/29/2022   PLT 174 06/29/2022   Lab Results  Component Value Date   IRON 44 06/29/2022   TIBC 458 (H) 06/29/2022   IRONPCTSAT 10 (L) 06/29/2022   Lab Results  Component Value Date   FERRITIN 6 (L) 06/29/2022     STUDIES: No results found.   ASSESSMENT: Iron deficiency anemia.  PLAN:    1. Iron deficiency anemia: Etiology unclear- likely multifactorial- CKD vs bleeding vs malabsorption (hernia) vs others. Endoscopy and colonoscopy have been negative for GI bleed. Colonoscopy 06/02/22 did reveal diverticula w/o evidence of bleeding and moderate grade I hemorrhoids. Endoscopy 01/14/21 revealed medium hiatal hernia and erosions w/o evidence of bleeding. Today, hmg 13.6, ferritin 6, iron sat 10%. Symptomatic. Recommend Feraheme 510 IV x 2.   2.  Right renal cell carcinoma: Patient underwent partial nephrectomy on 07/04/2020 at [facility] revealing stage I malignancy.  She follows up with [facility] every 6 months.  3. CKD- stage 3b- managed by Dr. Holley Raring. Gfr 35. Reviewed role of CKD in anemia. No role for EPO currently.   Disposition:  Feraheme x 2 3 mo- labs (cbc, ferritin, iron studies) Few days to week later- see me, +/- feraheme- la  I spent 25 minutes dedicated to the care of this patient (face-to-face and non-face-to-face) on the date of the encounter to include what is described in  the assessment and plan.  Patient expressed understanding and was in agreement with this plan. She also understands that She can call clinic at any time with any questions, concerns, or complaints.   Verlon Au, NP   07/02/2022

## 2022-07-07 ENCOUNTER — Ambulatory Visit: Payer: Medicare Other | Admitting: Urology

## 2022-07-08 ENCOUNTER — Ambulatory Visit: Payer: Medicare Other | Admitting: Family Medicine

## 2022-07-10 ENCOUNTER — Ambulatory Visit: Payer: Medicare Other

## 2022-07-14 ENCOUNTER — Other Ambulatory Visit: Payer: Self-pay | Admitting: Oncology

## 2022-07-15 ENCOUNTER — Inpatient Hospital Stay: Payer: Medicare Other

## 2022-07-15 VITALS — BP 146/68 | HR 66 | Temp 97.0°F | Resp 16

## 2022-07-15 DIAGNOSIS — D631 Anemia in chronic kidney disease: Secondary | ICD-10-CM | POA: Diagnosis not present

## 2022-07-15 DIAGNOSIS — Z85528 Personal history of other malignant neoplasm of kidney: Secondary | ICD-10-CM | POA: Diagnosis not present

## 2022-07-15 DIAGNOSIS — D509 Iron deficiency anemia, unspecified: Secondary | ICD-10-CM | POA: Diagnosis not present

## 2022-07-15 DIAGNOSIS — I129 Hypertensive chronic kidney disease with stage 1 through stage 4 chronic kidney disease, or unspecified chronic kidney disease: Secondary | ICD-10-CM | POA: Diagnosis not present

## 2022-07-15 DIAGNOSIS — Z79899 Other long term (current) drug therapy: Secondary | ICD-10-CM | POA: Diagnosis not present

## 2022-07-15 DIAGNOSIS — N1832 Chronic kidney disease, stage 3b: Secondary | ICD-10-CM | POA: Diagnosis not present

## 2022-07-15 MED ORDER — SODIUM CHLORIDE 0.9 % IV SOLN
Freq: Once | INTRAVENOUS | Status: AC
  Filled 2022-07-15: qty 250

## 2022-07-15 MED ORDER — SODIUM CHLORIDE 0.9 % IV SOLN
510.0000 mg | Freq: Once | INTRAVENOUS | Status: AC
  Administered 2022-07-15: 510 mg via INTRAVENOUS
  Filled 2022-07-15: qty 510

## 2022-07-15 NOTE — Patient Instructions (Signed)

## 2022-07-21 ENCOUNTER — Ambulatory Visit
Admission: RE | Admit: 2022-07-21 | Discharge: 2022-07-21 | Disposition: A | Discharge status: Home / Self Care | Payer: Medicare Other | Source: Ambulatory Visit | Attending: Urology | Admitting: Urology

## 2022-07-21 ENCOUNTER — Encounter: Payer: Self-pay | Admitting: Urology

## 2022-07-21 ENCOUNTER — Ambulatory Visit
Admission: RE | Admit: 2022-07-21 | Discharge: 2022-07-21 | Disposition: A | Discharge status: Home / Self Care | Payer: Medicare Other | Attending: Urology | Admitting: Urology

## 2022-07-21 ENCOUNTER — Ambulatory Visit (INDEPENDENT_AMBULATORY_CARE_PROVIDER_SITE_OTHER): Payer: Medicare Other | Admitting: Urology

## 2022-07-21 VITALS — BP 174/92 | HR 76 | Ht 62.0 in | Wt 179.0 lb

## 2022-07-21 DIAGNOSIS — N200 Calculus of kidney: Secondary | ICD-10-CM | POA: Insufficient documentation

## 2022-07-21 DIAGNOSIS — N3281 Overactive bladder: Secondary | ICD-10-CM | POA: Diagnosis not present

## 2022-07-21 DIAGNOSIS — Z87442 Personal history of urinary calculi: Secondary | ICD-10-CM

## 2022-07-21 NOTE — Progress Notes (Signed)
   07/21/2022 9:57 AM   [name] 1945/12/22 [id number]  Reason for visit: Follow up nephrolithiasis, kidney cancer, CKD, new OAB  HPI: 77 year old female who underwent urgent left ureteral stent placement in September 2021 for an infected left ureteral stone, with follow-up ureteroscopy in October 2021.  At that time she also was found to have a 4 cm central right renal mass, and ultimately underwent a radical right nephrectomy at [facility] with Dr. Lottie Rater in February 2022 with final path showing pT1a 3.9 cm clear-cell RCC, grade 3, with negative margins.  She is getting surveillance imaging with them, most recent MRI from August 2023 shows no evidence of recurrence, normal left kidney with no hydronephrosis.  She denies any stone episodes since our last visit.  I personally viewed and interpreted her KUB today that shows no evidence of radiopaque stones.We discussed general stone prevention strategies including adequate hydration with goal of producing 2.5 L of urine daily, increasing citric acid intake, increasing calcium intake during high oxalate meals, minimizing animal protein, and decreasing salt intake.  With her CKD she has increased her fluid intake, and is adding lemon juice to the water.  She reports some worsening overactive bladder symptoms with frequency, urgency, urge incontinence, nocturia 2-4x overnight.  She is at the point where she is interested in trying medication for this. We discussed that overactive bladder (OAB) is not a disease, but is a symptom complex that is generally not life-threatening.  Symptoms typically include urinary urgency, frequency, and urge incontinence.  There are numerous treatment options, however there are risks and benefits with both medical and surgical management.  First-line treatment is behavioral therapies including bladder training, pelvic floor muscle training, and fluid management.  Second line treatments include oral  antimuscarinics(Ditropan er, Trospium) and beta-3 agonist (Mybetriq). There is typically a period of medication trial (4-8 weeks) to find the optimal therapy and dosing. If symptoms are bothersome despite the above management, third line options include intra-detrusor botox, peripheral tibial nerve stimulation (PTNS), and interstim (SNS). These are more invasive treatments with higher side effect profile, but may improve quality of life for patients with severe OAB symptoms.   Trial of Myrbetriq 25 mg daily for OAB symptoms, samples provided Continue yearly follow-up for KUB for stone surveillance Continue follow-up at [facility] for surveillance history of RCC RTC with PA 4 weeks symptom check on Myrbetriq(would avoid anticholinergics with her age and baseline problems with constipation)   Billey Co, MD  [facility] [address] [place],  [address] [phone]

## 2022-07-27 ENCOUNTER — Telehealth: Payer: Self-pay | Admitting: Family Medicine

## 2022-07-27 NOTE — Telephone Encounter (Signed)
Please contact the patient.  She dropped off a handicap placard form.  Can you find out if she can walk 200 feet without stopping to rest or if she uses a cane or a walker when she walks?  I need to know these things to know if I can sign the form.  Thanks.

## 2022-07-29 NOTE — Telephone Encounter (Signed)
LVM for patient to call back to ask about the handicap form questions, can she walk 200 feet without stopping to rest, does she use a cane or walker when walking, once answered the provider can sign the form.  Demonica Farrey,cma

## 2022-07-30 NOTE — Telephone Encounter (Signed)
Pt called back and I read the message to her and she stated she cannot walk 200 feet and she does not use a cane or walker

## 2022-07-30 NOTE — Telephone Encounter (Signed)
Form signed.  

## 2022-08-11 NOTE — Telephone Encounter (Signed)
Did the patient pick this up? I just got a fax from her oncologist for me to fill this out again. I already filled it out. If she has not picked it up please call her to let her know it is available for pick up.

## 2022-08-11 NOTE — Telephone Encounter (Signed)
Patient stated she picked it up last week but due to it taking too long she sent in another form. Patient states please throw the second form away.

## 2022-08-12 ENCOUNTER — Ambulatory Visit: Payer: Medicare Other | Admitting: Family Medicine

## 2022-08-17 ENCOUNTER — Ambulatory Visit: Payer: Medicare Other | Admitting: Urology

## 2022-09-01 ENCOUNTER — Other Ambulatory Visit: Payer: Self-pay | Admitting: Gastroenterology

## 2022-09-01 DIAGNOSIS — K2270 Barrett's esophagus without dysplasia: Secondary | ICD-10-CM

## 2022-09-01 DIAGNOSIS — K2970 Gastritis, unspecified, without bleeding: Secondary | ICD-10-CM

## 2022-09-12 ENCOUNTER — Other Ambulatory Visit: Payer: Self-pay | Admitting: Family Medicine

## 2022-09-30 ENCOUNTER — Inpatient Hospital Stay: Payer: Medicare Other

## 2022-10-02 ENCOUNTER — Inpatient Hospital Stay: Payer: Medicare Other | Attending: Oncology

## 2022-10-02 DIAGNOSIS — D509 Iron deficiency anemia, unspecified: Secondary | ICD-10-CM | POA: Diagnosis not present

## 2022-10-02 LAB — CBC WITH DIFFERENTIAL/PLATELET
Abs Immature Granulocytes: 0.02 10*3/uL (ref 0.00–0.07)
Basophils Absolute: 0 10*3/uL (ref 0.0–0.1)
Basophils Relative: 0 %
Eosinophils Absolute: 0.2 10*3/uL (ref 0.0–0.5)
Eosinophils Relative: 3 %
HCT: 42.6 % (ref 36.0–46.0)
Hemoglobin: 13.9 g/dL (ref 12.0–15.0)
Immature Granulocytes: 0 %
Lymphocytes Relative: 26 %
Lymphs Abs: 1.9 10*3/uL (ref 0.7–4.0)
MCH: 31.4 pg (ref 26.0–34.0)
MCHC: 32.6 g/dL (ref 30.0–36.0)
MCV: 96.2 fL (ref 80.0–100.0)
Monocytes Absolute: 0.7 10*3/uL (ref 0.1–1.0)
Monocytes Relative: 10 %
Neutro Abs: 4.3 10*3/uL (ref 1.7–7.7)
Neutrophils Relative %: 61 %
Platelets: 156 10*3/uL (ref 150–400)
RBC: 4.43 MIL/uL (ref 3.87–5.11)
RDW: 14.4 % (ref 11.5–15.5)
WBC: 7.3 10*3/uL (ref 4.0–10.5)
nRBC: 0 % (ref 0.0–0.2)

## 2022-10-02 LAB — FERRITIN: Ferritin: 64 ng/mL (ref 11–307)

## 2022-10-02 LAB — IRON AND TIBC
Iron: 62 ug/dL (ref 28–170)
Saturation Ratios: 20 % (ref 10.4–31.8)
TIBC: 316 ug/dL (ref 250–450)
UIBC: 254 ug/dL

## 2022-10-06 ENCOUNTER — Encounter: Payer: Self-pay | Admitting: Nurse Practitioner

## 2022-10-09 ENCOUNTER — Ambulatory Visit: Payer: Medicare Other | Admitting: Nurse Practitioner

## 2022-10-09 ENCOUNTER — Ambulatory Visit: Payer: Medicare Other

## 2022-10-23 ENCOUNTER — Other Ambulatory Visit: Payer: Self-pay | Admitting: Family Medicine

## 2022-10-23 ENCOUNTER — Ambulatory Visit: Payer: Medicare Other | Admitting: Family Medicine

## 2022-10-23 DIAGNOSIS — I10 Essential (primary) hypertension: Secondary | ICD-10-CM

## 2022-10-23 DIAGNOSIS — E7800 Pure hypercholesterolemia, unspecified: Secondary | ICD-10-CM

## 2022-11-03 DIAGNOSIS — N2581 Secondary hyperparathyroidism of renal origin: Secondary | ICD-10-CM | POA: Diagnosis not present

## 2022-11-03 DIAGNOSIS — I10 Essential (primary) hypertension: Secondary | ICD-10-CM | POA: Diagnosis not present

## 2022-11-03 DIAGNOSIS — N1832 Chronic kidney disease, stage 3b: Secondary | ICD-10-CM | POA: Diagnosis not present

## 2022-11-09 ENCOUNTER — Other Ambulatory Visit: Payer: Self-pay | Admitting: Family Medicine

## 2022-11-28 ENCOUNTER — Other Ambulatory Visit: Payer: Self-pay | Admitting: Gastroenterology

## 2022-11-28 DIAGNOSIS — K2270 Barrett's esophagus without dysplasia: Secondary | ICD-10-CM

## 2022-11-28 DIAGNOSIS — K2970 Gastritis, unspecified, without bleeding: Secondary | ICD-10-CM

## 2022-12-14 ENCOUNTER — Ambulatory Visit: Payer: Medicare Other | Admitting: Urology

## 2022-12-18 ENCOUNTER — Other Ambulatory Visit (INDEPENDENT_AMBULATORY_CARE_PROVIDER_SITE_OTHER): Payer: Medicare Other

## 2022-12-18 ENCOUNTER — Telehealth (INDEPENDENT_AMBULATORY_CARE_PROVIDER_SITE_OTHER): Payer: Medicare Other | Admitting: Family Medicine

## 2022-12-18 ENCOUNTER — Encounter: Payer: Self-pay | Admitting: Family Medicine

## 2022-12-18 VITALS — BP 132/82

## 2022-12-18 DIAGNOSIS — E7800 Pure hypercholesterolemia, unspecified: Secondary | ICD-10-CM

## 2022-12-18 DIAGNOSIS — I10 Essential (primary) hypertension: Secondary | ICD-10-CM

## 2022-12-18 DIAGNOSIS — R7309 Other abnormal glucose: Secondary | ICD-10-CM

## 2022-12-18 DIAGNOSIS — J40 Bronchitis, not specified as acute or chronic: Secondary | ICD-10-CM | POA: Diagnosis not present

## 2022-12-18 DIAGNOSIS — R059 Cough, unspecified: Secondary | ICD-10-CM

## 2022-12-18 LAB — POC COVID19 BINAXNOW: SARS Coronavirus 2 Ag: NEGATIVE

## 2022-12-18 MED ORDER — AZITHROMYCIN 250 MG PO TABS
ORAL_TABLET | ORAL | 0 refills | Status: AC
Start: 2022-12-18 — End: 2022-12-23

## 2022-12-18 MED ORDER — GUAIFENESIN-CODEINE 100-10 MG/5ML PO SOLN
10.0000 mL | Freq: Three times a day (TID) | ORAL | 0 refills | Status: DC | PRN
Start: 2022-12-18 — End: 2023-03-05

## 2022-12-18 NOTE — Progress Notes (Signed)
Virtual Visit via video Note  I connected with Debbie Delacruz today at 11:30 AM EDT by a video enabled telemedicine application or telephone and verified that I am speaking with the correct person using two identifiers. Location patient: home Location provider: work or home office Persons participating in the virtual visit: patient, provider  I discussed the limitations, risks, security and privacy concerns of performing an evaluation and management service by telephone and the availability of in person appointments. I also discussed with the patient that there may be a patient responsible charge related to this service. The patient expressed understanding and agreed to proceed.   Reason for visit: Follow-up.  HPI: HYPERTENSION Disease Monitoring Home BP Monitoring 130s/80s Chest pain- no    Dyspnea- no Medications Compliance-  taking amlodipine, losartan, metoprolol, Imdur.  Edema- no BMET    Component Value Date/Time   NA 140 07/18/2021 1528   NA 137 06/06/2014 0448   K 5.0 07/24/2021 0735   K 3.3 (L) 06/06/2014 0448   CL 103 07/18/2021 1528   CL 101 06/06/2014 0448   CO2 23 07/18/2021 1528   CO2 29 06/06/2014 0448   GLUCOSE 100 (H) 07/18/2021 1528   GLUCOSE 104 (H) 07/07/2021 0818   GLUCOSE 106 (H) 06/06/2014 0448   BUN 30 (H) 07/18/2021 1528   BUN 20 (H) 06/06/2014 0448   CREATININE 1.49 (H) 07/18/2021 1528   CREATININE 1.05 06/06/2014 0448   CALCIUM 9.3 07/18/2021 1528   CALCIUM 8.2 (L) 06/06/2014 0448   GFRNONAA 51 (L) 10/04/2020 0441   GFRNONAA 56 (L) 06/06/2014 0448   GFRAA >60 02/24/2020 0456   GFRAA >60 06/06/2014 0448   HYPERLIPIDEMIA Symptoms Chest pain on exertion:  no   Medications: Compliance- taking zetia, crestor Right upper quadrant pain- no  Muscle aches- no Lipid Panel     Component Value Date/Time   CHOL 165 09/18/2020 0803   TRIG 83.0 09/18/2020 0803   HDL 46.10 09/18/2020 0803   CHOLHDL 4 09/18/2020 0803   VLDL 16.6 09/18/2020 0803    LDLCALC 102 (H) 09/18/2020 0803   LDLDIRECT 45.0 07/07/2021 0818   Respiratory illness: Patient notes onset of symptoms almost 2 weeks ago.  She went on a trip and multiple people she was on the trip with ended up testing positive for COVID.  Her symptoms started with chills, fever, sneezing, and coughing.  Currently she has issues with coughing that is nonproductive.  Some sneezing, rhinorrhea, and headache.  No head congestion.  No fevers recently.  She has not been improving or worsening.   ROS: See pertinent positives and negatives per HPI.  Past Medical History:  Diagnosis Date   A-fib (HCC)    Anemia    Anginal pain (HCC)    Anxiety    Arthritis    CAD (coronary artery disease)    Cancer (HCC)    Carpal tunnel syndrome    bilateral   Cataract    bil removed cataracts   Coronary artery disease    2x stents, Dr. Juliann Pares   Depression    Dysrhythmia    Family history of adverse reaction to anesthesia    "brother; S/P lipotripsy in [place]; transferred to [facility]; had to put him on life support for 2 days"   GERD (gastroesophageal reflux disease)    Headache    "weekly" (09/13/2014)   History of blood transfusion 05/2014   "we haven't figured out why I needed it yet" (09/13/2014)   History of hiatal  hernia    Hyperglycemia    Hyperlipidemia    Hypertension    Kidney stone    Myocardial infarct [facility])    Myocardial infarct, old 1999   15 years ago   Obesity    PONV (postoperative nausea and vomiting)    Renal mass    S/P CABG x 4 09/18/2014   LIMA to LAD, SVG to PDA-dRCA sequentially, SVG to OM, EVH via right thigh    Sleep apnea    Sleep apnea    "don't use a mask" (09/13/2014)   Unstable angina [facility])     Past Surgical History:  Procedure Laterality Date   ABDOMINAL HYSTERECTOMY     APPENDECTOMY     BACK SURGERY     BLADDER SURGERY     CARDIAC CATHETERIZATION  06/2014   CARDIAC CATHETERIZATION N/A 06/16/2016   Procedure: Left Heart Cath and  Cors/Grafts Angiography;  Surgeon: Kathleene Hazel, MD;  Location: [facility] INVASIVE CV LAB;  Service: Cardiovascular;  Laterality: N/A;   CARPAL TUNNEL RELEASE Left    CATARACT EXTRACTION W/ INTRAOCULAR LENS  IMPLANT, BILATERAL     CHOLECYSTECTOMY     COLONOSCOPY  2022   MS-MAC-prep good-tics/multiple frag of TA-1 yr recall   CORONARY ANGIOPLASTY WITH STENT PLACEMENT  1999   [facility] x2 stent   CORONARY ARTERY BYPASS GRAFT N/A 09/18/2014   Procedure: CORONARY ARTERY BYPASS GRAFTING (CABG)times four using LIMA  to LAD:SVG to  PD and DIST RCA;SVG to OM;, EVH Right thigh;  Surgeon: Purcell Nails, MD;  Location: [facility] OR;  Service: Open Heart Surgery;  Laterality: N/A;   CYSTOSCOPY W/ RETROGRADES Left 03/15/2020   Procedure: CYSTOSCOPY WITH RETROGRADE PYELOGRAM;  Surgeon: Sondra Come, MD;  Location: [facility] ORS;  Service: Urology;  Laterality: Left;   CYSTOSCOPY WITH STENT PLACEMENT Left 02/20/2020   Procedure: CYSTOSCOPY WITH STENT PLACEMENT;  Surgeon: Sondra Come, MD;  Location: [facility] ORS;  Service: Urology;  Laterality: Left;   CYSTOSCOPY/URETEROSCOPY/HOLMIUM LASER/STENT PLACEMENT Left 03/15/2020   Procedure: CYSTOSCOPY/URETEROSCOPY;  Surgeon: Sondra Come, MD;  Location: [facility] ORS;  Service: Urology;  Laterality: Left;   ESOPHAGOGASTRODUODENOSCOPY N/A 09/17/2014   Procedure: ESOPHAGOGASTRODUODENOSCOPY (EGD);  Surgeon: Louis Meckel, MD;  Location: [facility] ENDOSCOPY;  Service: Endoscopy;  Laterality: N/A;   heart stents     1999   INCONTINENCE SURGERY     INGUINAL HERNIA REPAIR     INGUINAL HERNIA REPAIR Right X 3   "last one was in the 1990's; still have hernia there now" (09/13/2014)   LAPAROSCOPIC CHOLECYSTECTOMY     LUMBAR DISC SURGERY  1990's?   LUMBAR SPINE SURGERY     NEPHRECTOMY Right    removal of ovaries     TEE WITHOUT CARDIOVERSION N/A 09/18/2014   Procedure: TRANSESOPHAGEAL ECHOCARDIOGRAM (TEE);  Surgeon: Purcell Nails, MD;  Location: [facility] OR;  Service: Open Heart Surgery;   Laterality: N/A;   UPPER GASTROINTESTINAL ENDOSCOPY      Family History  Problem Relation Age of Onset   Coronary artery disease Mother    Osteoporosis Mother    Heart disease Mother    Stroke Mother    Heart attack Mother    Hypertension Mother    Hypertension Father    Coronary artery disease Father    COPD Father    Heart disease Father    Heart attack Father    Glaucoma Father    Osteoporosis Father    Emphysema Father    Cancer Sister 30  colon   Hypertension Sister    Colon cancer Sister        dx in her 45's   Breast cancer Sister    Colon polyps Sister    Stroke Brother    Hypertension Brother    Colon polyps Brother    Stroke Maternal Grandfather    Stroke Maternal Grandmother    Osteoporosis Maternal Grandmother    Hypertension Maternal Grandmother    Hypertension Paternal Grandmother    Hypertension Paternal Grandfather    Breast cancer Maternal Aunt        2 aunts-50's   Esophageal cancer Neg Hx    Stomach cancer Neg Hx    Rectal cancer Neg Hx    Pancreatic cancer Neg Hx     SOCIAL HX: Non-smoker   Current Outpatient Medications:    acetaminophen (TYLENOL) 500 MG tablet, Take 500 mg by mouth every 6 (six) hours as needed for headache., Disp: , Rfl:    amLODipine (NORVASC) 5 MG tablet, TAKE 1 TABLET BY MOUTH ONCE DAILY, Disp: 90 tablet, Rfl: 3   aspirin EC 81 MG tablet, Take 1 tablet (81 mg total) by mouth daily., Disp: , Rfl:    azithromycin (ZITHROMAX) 250 MG tablet, Take 2 tablets on day 1, then 1 tablet daily on days 2 through 5, Disp: 6 tablet, Rfl: 0   Blood Pressure Monitoring (BLOOD PRESSURE KIT) DEVI, Check blood pressure daily, brand at patient and insurance preference, dx code I10, Disp: 1 each, Rfl: 0   Docusate Sodium (DSS) 100 MG CAPS, Take 1 capsule by mouth daily at 6 (six) AM., Disp: , Rfl:    ezetimibe (ZETIA) 10 MG tablet, Take 1 tablet (10 mg total) by mouth daily., Disp: 90 tablet, Rfl: 3   gentamicin ointment (GARAMYCIN)  0.1 %, Apply 1 Application topically as needed (to nose as needed)., Disp: , Rfl:    guaiFENesin-codeine 100-10 MG/5ML syrup, Take 10 mLs by mouth 3 (three) times daily as needed for cough., Disp: 120 mL, Rfl: 0   isosorbide mononitrate (IMDUR) 30 MG 24 hr tablet, TAKE 1 TABLET BY MOUTH ONCE DAILY, Disp: 90 tablet, Rfl: 1   losartan (COZAAR) 100 MG tablet, Take 100 mg by mouth daily., Disp: , Rfl:    metoprolol succinate (TOPROL-XL) 50 MG 24 hr tablet, TAKE 1 TABLET BY MOUTH ONCE DAILY MUST  KEEP  SCHEDULED  APPT  FOR  ADDITIONAL  REFILLS, Disp: 45 tablet, Rfl: 0   nitroGLYCERIN (NITROSTAT) 0.4 MG SL tablet, Place 1 tablet (0.4 mg total) under the tongue every 5 (five) minutes as needed for chest pain. Take for up to 3 doses and then call 911 if still having chest pain., Disp: 50 tablet, Rfl: 3   ondansetron (ZOFRAN) 4 MG tablet, Take 1 tablet (4 mg total) by mouth every 8 (eight) hours as needed for up to 10 doses for nausea or vomiting., Disp: 10 tablet, Rfl: 0   pantoprazole (PROTONIX) 40 MG tablet, Take 1 tablet by mouth once daily, Disp: 90 tablet, Rfl: 0   rosuvastatin (CRESTOR) 40 MG tablet, Take 1 tablet by mouth once daily, Disp: 90 tablet, Rfl: 0   Sodium Sulfate-Mag Sulfate-KCl (SUTAB) [id number] MG TABS, Take 1 kit by mouth as directed. Patient to bring in coupon (Patient not taking: Reported on 12/18/2022), Disp: 24 tablet, Rfl: 0  EXAM:  VITALS per patient if applicable:  GENERAL: alert, oriented, appears well and in no acute distress  HEENT: atraumatic, conjunttiva clear, no obvious abnormalities on  inspection of external nose and ears  NECK: normal movements of the head and neck  LUNGS: on inspection no signs of respiratory distress, breathing rate appears normal, no obvious gross SOB, gasping or wheezing  CV: no obvious cyanosis  MS: moves all visible extremities without noticeable abnormality  PSYCH/NEURO: pleasant and cooperative, no obvious depression or anxiety,  speech and thought processing grossly intact  ASSESSMENT AND PLAN:  Discussed the following assessment and plan:  Problem List Items Addressed This Visit     Bronchitis    Patient's COVID test was negative through our office though given her exposures that is most likely that she initially had COVID.  At this point she is outside of the treatment window for COVID with COVID-specific medication.  Suspect she has bronchitis at this point and we will treat with azithromycin 500 mg on day 1 and 250 mg on days 2 through 5.  If not improving she will let us know.  If she develops shortness of breath, fevers, or any worsening symptoms she will seek medical attention in person.  She was added with cough suppressant and advised to monitor for drowsiness with this.      Relevant Medications   azithromycin (ZITHROMAX) 250 MG tablet   guaiFENesin-codeine 100-10 MG/5ML syrup   Hyperlipidemia    Chronic issue.  Check lipid panel.  Patient will continue Zetia 10 mg daily and Crestor 40 mg daily.      Relevant Orders   Lipid panel   Hypertension - Primary (Chronic)    Chronic issue.  Seems to be decently well-controlled at home.  She will continue Imdur 30 mg daily, losartan 100 mg daily, metoprolol 50 mg daily, and amlodipine 5 mg daily.      Other Visit Diagnoses     Elevated glucose       Relevant Orders   Hemoglobin A1c     Lab appointment scheduled for patient while I was on the computer with her.  Return in about 6 months (around 06/20/2023).   I discussed the assessment and treatment plan with the patient. The patient was provided an opportunity to ask questions and all were answered. The patient agreed with the plan and demonstrated an understanding of the instructions.   The patient was advised to call back or seek an in-person evaluation if the symptoms worsen or if the condition fails to improve as anticipated.  Marikay Alar, MD

## 2022-12-18 NOTE — Assessment & Plan Note (Signed)
Chronic issue.  Seems to be decently well-controlled at home.  She will continue Imdur 30 mg daily, losartan 100 mg daily, metoprolol 50 mg daily, and amlodipine 5 mg daily.

## 2022-12-18 NOTE — Assessment & Plan Note (Signed)
Chronic issue.  Check lipid panel.  Patient will continue Zetia 10 mg daily and Crestor 40 mg daily.

## 2022-12-18 NOTE — Assessment & Plan Note (Signed)
Patient's COVID test was negative through our office though given her exposures that is most likely that she initially had COVID.  At this point she is outside of the treatment window for COVID with COVID-specific medication.  Suspect she has bronchitis at this point and we will treat with azithromycin 500 mg on day 1 and 250 mg on days 2 through 5.  If not improving she will let us know.  If she develops shortness of breath, fevers, or any worsening symptoms she will seek medical attention in person.  She was added with cough suppressant and advised to monitor for drowsiness with this.

## 2022-12-21 ENCOUNTER — Telehealth: Payer: Self-pay | Admitting: Family Medicine

## 2022-12-21 NOTE — Telephone Encounter (Signed)
Lvm for pt to scheduled her 6 months f/u after her MyChart visit on 12/18/22.

## 2022-12-22 NOTE — Progress Notes (Deleted)
12/23/2022 8:52 AM   Debbie Delacruz [birth year] [id number]  Referring provider: Glori Luis, MD [address] [place],  [place] [address]  Urological history: 1. RCC - 4 cm central right renal mass, and ultimately underwent a radical right nephrectomy at [facility] with Dr. 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  She is getting surveillance imaging with them, most recent MRI from August 2023 shows no evidence of recurrence, normal left kidney with no hydronephrosis.  -follow by [facility]   2.  Nephrolithiasis -left urs (2021)  -KUB (06/2022) - no stones seen  3. OAB -Contributing factors of age, GSM, sleep apnea, HTN, anxiety, depression and obesity   No chief complaint on file.   HPI: Debbie Delacruz is a 77 y.o. female who presents today for urgency, dribbling and bilateral back pain.   Previous records reviewed.   UA ***  PVR ***   PMH: Past Medical History:  Diagnosis Date   A-fib (HCC)    Anemia    Anginal pain (HCC)    Anxiety    Arthritis    CAD (coronary artery disease)    Cancer (HCC)    Carpal tunnel syndrome    bilateral   Cataract    bil removed cataracts   Coronary artery disease    2x stents, Dr. Juliann Pares   Depression    Dysrhythmia    Family history of adverse reaction to anesthesia    "brother; S/P lipotripsy in [place]; transferred to [facility]; had to put him on life support for 2 days"   GERD (gastroesophageal reflux disease)    Headache    "weekly" (09/13/2014)   History of blood transfusion 05/2014   "we haven't figured out why I needed it yet" (09/13/2014)   History of hiatal hernia    Hyperglycemia    Hyperlipidemia    Hypertension    Kidney stone    Myocardial infarct (HCC)    Myocardial infarct, old 1999   15 years ago   Obesity    PONV (postoperative nausea and vomiting)    Renal mass    S/P CABG x 4 09/18/2014   LIMA to LAD, SVG  to PDA-dRCA sequentially, SVG to OM, EVH via right thigh    Sleep apnea    Sleep apnea    "don't use a mask" (09/13/2014)   Unstable angina [facility])     Surgical History: Past Surgical History:  Procedure Laterality Date   ABDOMINAL HYSTERECTOMY     APPENDECTOMY     BACK SURGERY     BLADDER SURGERY     CARDIAC CATHETERIZATION  06/2014   CARDIAC CATHETERIZATION N/A 06/16/2016   Procedure: Left Heart Cath and Cors/Grafts Angiography;  Surgeon: Kathleene Hazel, MD;  Location: [facility] INVASIVE CV LAB;  Service: Cardiovascular;  Laterality: N/A;   CARPAL TUNNEL RELEASE Left    CATARACT EXTRACTION W/ INTRAOCULAR LENS  IMPLANT, BILATERAL     CHOLECYSTECTOMY     COLONOSCOPY  2022   MS-MAC-prep good-tics/multiple frag of TA-1 yr recall   CORONARY ANGIOPLASTY WITH STENT PLACEMENT  1999   [facility] x2 stent   CORONARY ARTERY BYPASS GRAFT N/A 09/18/2014   Procedure: CORONARY ARTERY BYPASS GRAFTING (CABG)times four using LIMA  to LAD:SVG to  PD and DIST RCA;SVG to OM;, EVH Right thigh;  Surgeon: Purcell Nails, MD;  Location: [facility] OR;  Service: Open Heart Surgery;  Laterality: N/A;  CYSTOSCOPY W/ RETROGRADES Left 03/15/2020   Procedure: CYSTOSCOPY WITH RETROGRADE PYELOGRAM;  Surgeon: Sondra Come, MD;  Location: [facility] ORS;  Service: Urology;  Laterality: Left;   CYSTOSCOPY WITH STENT PLACEMENT Left 02/20/2020   Procedure: CYSTOSCOPY WITH STENT PLACEMENT;  Surgeon: Sondra Come, MD;  Location: [facility] ORS;  Service: Urology;  Laterality: Left;   CYSTOSCOPY/URETEROSCOPY/HOLMIUM LASER/STENT PLACEMENT Left 03/15/2020   Procedure: CYSTOSCOPY/URETEROSCOPY;  Surgeon: Sondra Come, MD;  Location: [facility] ORS;  Service: Urology;  Laterality: Left;   ESOPHAGOGASTRODUODENOSCOPY N/A 09/17/2014   Procedure: ESOPHAGOGASTRODUODENOSCOPY (EGD);  Surgeon: Louis Meckel, MD;  Location: [facility] ENDOSCOPY;  Service: Endoscopy;  Laterality: N/A;   heart stents     1999   INCONTINENCE SURGERY     INGUINAL HERNIA REPAIR      INGUINAL HERNIA REPAIR Right X 3   "last one was in the 1990's; still have hernia there now" (09/13/2014)   LAPAROSCOPIC CHOLECYSTECTOMY     LUMBAR DISC SURGERY  1990's?   LUMBAR SPINE SURGERY     NEPHRECTOMY Right    removal of ovaries     TEE WITHOUT CARDIOVERSION N/A 09/18/2014   Procedure: TRANSESOPHAGEAL ECHOCARDIOGRAM (TEE);  Surgeon: Purcell Nails, MD;  Location: [facility] OR;  Service: Open Heart Surgery;  Laterality: N/A;   UPPER GASTROINTESTINAL ENDOSCOPY      Home Medications:  Allergies as of 12/23/2022       Reactions   Ferrous Fumarate Other (See Comments)   STOMACH ISSUES STOMACH ISSUES STOMACH ISSUES   Lipitor [atorvastatin] Other (See Comments)   myalgia        Medication List        Accurate as of December 22, 2022  8:52 AM. If you have any questions, ask your nurse or doctor.          acetaminophen 500 MG tablet Commonly known as: TYLENOL Take 500 mg by mouth every 6 (six) hours as needed for headache.   amLODipine 5 MG tablet Commonly known as: NORVASC TAKE 1 TABLET BY MOUTH ONCE DAILY   aspirin EC 81 MG tablet Take 1 tablet (81 mg total) by mouth daily.   azithromycin 250 MG tablet Commonly known as: ZITHROMAX Take 2 tablets on day 1, then 1 tablet daily on days 2 through 5   Blood Pressure Kit Devi Check blood pressure daily, brand at patient and insurance preference, dx code I10   DSS 100 MG Caps Take 1 capsule by mouth daily at 6 (six) AM.   ezetimibe 10 MG tablet Commonly known as: Zetia Take 1 tablet (10 mg total) by mouth daily.   gentamicin ointment 0.1 % Commonly known as: GARAMYCIN Apply 1 Application topically as needed (to nose as needed).   guaiFENesin-codeine 100-10 MG/5ML syrup Take 10 mLs by mouth 3 (three) times daily as needed for cough.   isosorbide mononitrate 30 MG 24 hr tablet Commonly known as: IMDUR TAKE 1 TABLET BY MOUTH ONCE DAILY   losartan 100 MG tablet Commonly known as: COZAAR Take 100 mg by mouth  daily.   metoprolol succinate 50 MG 24 hr tablet Commonly known as: TOPROL-XL TAKE 1 TABLET BY MOUTH ONCE DAILY MUST  KEEP  SCHEDULED  APPT  FOR  ADDITIONAL  REFILLS   nitroGLYCERIN 0.4 MG SL tablet Commonly known as: NITROSTAT Place 1 tablet (0.4 mg total) under the tongue every 5 (five) minutes as needed for chest pain. Take for up to 3 doses and then call 911 if still having chest pain.  ondansetron 4 MG tablet Commonly known as: Zofran Take 1 tablet (4 mg total) by mouth every 8 (eight) hours as needed for up to 10 doses for nausea or vomiting.   pantoprazole 40 MG tablet Commonly known as: PROTONIX Take 1 tablet by mouth once daily   rosuvastatin 40 MG tablet Commonly known as: CRESTOR Take 1 tablet by mouth once daily   Sutab [id number] MG Tabs Generic drug: Sodium Sulfate-Mag Sulfate-KCl Take 1 kit by mouth as directed. Patient to bring in coupon        Allergies:  Allergies  Allergen Reactions   Ferrous Fumarate Other (See Comments)    STOMACH ISSUES STOMACH ISSUES STOMACH ISSUES   Lipitor [Atorvastatin] Other (See Comments)    myalgia    Family History: Family History  Problem Relation Age of Onset   Coronary artery disease Mother    Osteoporosis Mother    Heart disease Mother    Stroke Mother    Heart attack Mother    Hypertension Mother    Hypertension Father    Coronary artery disease Father    COPD Father    Heart disease Father    Heart attack Father    Glaucoma Father    Osteoporosis Father    Emphysema Father    Cancer Sister 18       colon   Hypertension Sister    Colon cancer Sister        dx in her 12's   Breast cancer Sister    Colon polyps Sister    Stroke Brother    Hypertension Brother    Colon polyps Brother    Stroke Maternal Grandfather    Stroke Maternal Grandmother    Osteoporosis Maternal Grandmother    Hypertension Maternal Grandmother    Hypertension Paternal Grandmother    Hypertension Paternal Grandfather     Breast cancer Maternal Aunt        2 aunts-50's   Esophageal cancer Neg Hx    Stomach cancer Neg Hx    Rectal cancer Neg Hx    Pancreatic cancer Neg Hx     Social History:  reports that she has never smoked. She has never been exposed to tobacco smoke. She has never used smokeless tobacco. She reports that she does not currently use alcohol. She reports that she does not use drugs.  ROS: Pertinent ROS in HPI  Physical Exam: There were no vitals taken for this visit.  Constitutional:  Well nourished. Alert and oriented, No acute distress. HEENT: [place] AT, moist mucus membranes.  Trachea midline, no masses. Cardiovascular: No clubbing, cyanosis, or edema. Respiratory: Normal respiratory effort, no increased work of breathing. GU: No CVA tenderness.  No bladder fullness or masses. Vulvovaginal atrophy w/ pallor, loss of rugae, introital retraction, excoriations.  Vulvar thinning, fusion of labia, clitoral hood retraction, prominent urethral meatus.   *** external genitalia, *** pubic hair distribution, no lesions.  Normal urethral meatus, no lesions, no prolapse, no discharge.   No urethral masses, tenderness and/or tenderness. No bladder fullness, tenderness or masses. *** vagina mucosa, *** estrogen effect, no discharge, no lesions, *** pelvic support, *** cystocele and *** rectocele noted.  No cervical motion tenderness.  Uterus is freely mobile and non-fixed.  No adnexal/parametria masses or tenderness noted.  Anus and perineum are without rashes or lesions.   ***  Neurologic: Grossly intact, no focal deficits, moving all 4 extremities. Psychiatric: Normal mood and affect.    Laboratory Data: Lab Results  Component Value  Date   WBC 7.3 10/02/2022   HGB 13.9 10/02/2022   HCT 42.6 10/02/2022   MCV 96.2 10/02/2022   PLT 156 10/02/2022   Renal Function Panel Order: [id number] Component Ref Range & Units 1 mo ago  Glucose 65 - 139 mg/dL 782  Comment:                                                                                          Non-fasting reference interval                                           BUN 7 - 25 mg/dL 36 High   Creatinine 9.56 - 1.00 mg/dL 2.13 High   eGFR CKD-EPI CR 2021 > OR = 60 mL/min/1.66m2 36 Low   BUN/Creatinine Ratio 6 - 22 (calc) 24 High   Sodium 135 - 146 mmol/L 138  Potassium 3.5 - 5.3 mmol/L 4.2  Chloride 98 - 110 mmol/L 109  Bicarbonate (CO2) 20 - 32 mmol/L 21  Calcium 8.6 - 10.4 mg/dL 9.2  Phosphorus 2.1 - 4.3 mg/dL 3.3  Albumin 3.6 - 5.1 g/dL 4.0  Resulting Agency See order comments  Narrative Performed by [facility] FASTING:NO  FASTING: NO  Specimen Collected: 11/03/22 08:58   Performed by: Chancy Hurter Last Resulted: 11/04/22 12:00  Received From: [facility]  Result Received: 11/04/22 13:57   Urinalysis ***  I have reviewed the labs.   Pertinent Imaging: *** I have independently reviewed the films.    Assessment & Plan:  ***  1. Urgency -UA *** -Urine culture sent -PVR *** *** No follow-ups on file.  These notes generated with voice recognition software. I apologize for typographical errors.  Cloretta Ned  [facility] [address]  [address] [place], [place] [address] [phone]

## 2022-12-23 ENCOUNTER — Ambulatory Visit: Payer: Medicare Other | Admitting: Urology

## 2022-12-23 DIAGNOSIS — N3281 Overactive bladder: Secondary | ICD-10-CM

## 2022-12-25 ENCOUNTER — Other Ambulatory Visit (INDEPENDENT_AMBULATORY_CARE_PROVIDER_SITE_OTHER): Payer: Medicare Other

## 2022-12-25 DIAGNOSIS — R7309 Other abnormal glucose: Secondary | ICD-10-CM

## 2022-12-25 DIAGNOSIS — E7800 Pure hypercholesterolemia, unspecified: Secondary | ICD-10-CM | POA: Diagnosis not present

## 2022-12-25 LAB — LIPID PANEL
Cholesterol: 140 mg/dL (ref 0–200)
HDL: 35.2 mg/dL — ABNORMAL LOW (ref >39.00)
LDL Cholesterol: 79 mg/dL (ref 0–99)
NonHDL: 104.85
Total CHOL/HDL Ratio: 4
Triglycerides: 129 mg/dL (ref 0.0–149.0)
VLDL: 25.8 mg/dL (ref 0.0–40.0)

## 2022-12-25 LAB — HEMOGLOBIN A1C: Hgb A1c MFr Bld: 6 % (ref 4.6–6.5)

## 2023-01-03 ENCOUNTER — Other Ambulatory Visit: Payer: Self-pay | Admitting: Family Medicine

## 2023-01-03 ENCOUNTER — Encounter: Payer: Self-pay | Admitting: Family Medicine

## 2023-01-04 NOTE — Telephone Encounter (Signed)
Please call the patient. We could try praluent instead to see if she can tolerate that. It is injectable like repatha. If she can not tolerate that then we would need to refer to the lipid clinic in [place].

## 2023-01-06 ENCOUNTER — Encounter: Payer: Self-pay | Admitting: Nurse Practitioner

## 2023-01-06 ENCOUNTER — Telehealth: Payer: Self-pay

## 2023-01-06 ENCOUNTER — Inpatient Hospital Stay: Payer: Medicare Other | Attending: Oncology

## 2023-01-06 DIAGNOSIS — C641 Malignant neoplasm of right kidney, except renal pelvis: Secondary | ICD-10-CM | POA: Diagnosis not present

## 2023-01-06 DIAGNOSIS — N1832 Chronic kidney disease, stage 3b: Secondary | ICD-10-CM | POA: Insufficient documentation

## 2023-01-06 DIAGNOSIS — D631 Anemia in chronic kidney disease: Secondary | ICD-10-CM | POA: Diagnosis not present

## 2023-01-06 DIAGNOSIS — D509 Iron deficiency anemia, unspecified: Secondary | ICD-10-CM | POA: Insufficient documentation

## 2023-01-06 DIAGNOSIS — Z905 Acquired absence of kidney: Secondary | ICD-10-CM | POA: Diagnosis not present

## 2023-01-06 DIAGNOSIS — I2510 Atherosclerotic heart disease of native coronary artery without angina pectoris: Secondary | ICD-10-CM

## 2023-01-06 DIAGNOSIS — E785 Hyperlipidemia, unspecified: Secondary | ICD-10-CM

## 2023-01-06 LAB — CBC WITH DIFFERENTIAL/PLATELET
Abs Immature Granulocytes: 0 10*3/uL (ref 0.00–0.07)
Basophils Absolute: 0 10*3/uL (ref 0.0–0.1)
Basophils Relative: 1 %
Eosinophils Absolute: 0.3 10*3/uL (ref 0.0–0.5)
Eosinophils Relative: 5 %
HCT: 43.3 % (ref 36.0–46.0)
Hemoglobin: 13.9 g/dL (ref 12.0–15.0)
Immature Granulocytes: 0 %
Lymphocytes Relative: 23 %
Lymphs Abs: 1.2 10*3/uL (ref 0.7–4.0)
MCH: 31.4 pg (ref 26.0–34.0)
MCHC: 32.1 g/dL (ref 30.0–36.0)
MCV: 97.7 fL (ref 80.0–100.0)
Monocytes Absolute: 0.7 10*3/uL (ref 0.1–1.0)
Monocytes Relative: 13 %
Neutro Abs: 3 10*3/uL (ref 1.7–7.7)
Neutrophils Relative %: 58 %
Platelets: 160 10*3/uL (ref 150–400)
RBC: 4.43 MIL/uL (ref 3.87–5.11)
RDW: 13.3 % (ref 11.5–15.5)
WBC: 5.1 10*3/uL (ref 4.0–10.5)
nRBC: 0 % (ref 0.0–0.2)

## 2023-01-06 LAB — FERRITIN: Ferritin: 56 ng/mL (ref 11–307)

## 2023-01-06 LAB — IRON AND TIBC
Iron: 88 ug/dL (ref 28–170)
Saturation Ratios: 26 % (ref 10.4–31.8)
TIBC: 342 ug/dL (ref 250–450)
UIBC: 254 ug/dL

## 2023-01-06 MED ORDER — PRALUENT 75 MG/ML ~~LOC~~ SOAJ: 75.0000 mg | SUBCUTANEOUS | 2 refills | Status: DC

## 2023-01-06 NOTE — Addendum Note (Signed)
Addended by: Glori Luis on: 01/06/2023 03:34 PM   Modules accepted: Orders

## 2023-01-06 NOTE — Telephone Encounter (Signed)
Sent to pharmacy. Patient needs labs rechecked in 6 weeks. Orders placed.

## 2023-01-06 NOTE — Telephone Encounter (Signed)
Lvm for pt to give [place] a call back about new medication Dr. Birdie Sons would like her to try

## 2023-01-06 NOTE — Telephone Encounter (Signed)
Lvm for pt to call back. 

## 2023-01-06 NOTE — Telephone Encounter (Signed)
Spoke to pt she is in agreement of starting praulent. Informed pt to let us know if she has any side effects. Would like it sent to [facility] on [address]

## 2023-01-07 NOTE — Telephone Encounter (Signed)
Lvm for pt to schedule lab appt for 6 weeks out

## 2023-01-07 NOTE — Telephone Encounter (Signed)
Pt scheduled for labs on 09/20

## 2023-01-08 ENCOUNTER — Inpatient Hospital Stay (HOSPITAL_BASED_OUTPATIENT_CLINIC_OR_DEPARTMENT_OTHER): Payer: Medicare Other | Admitting: Nurse Practitioner

## 2023-01-08 ENCOUNTER — Inpatient Hospital Stay: Payer: Medicare Other

## 2023-01-08 ENCOUNTER — Inpatient Hospital Stay: Payer: Medicare Other | Admitting: Nurse Practitioner

## 2023-01-08 ENCOUNTER — Encounter: Payer: Self-pay | Admitting: Nurse Practitioner

## 2023-01-08 DIAGNOSIS — C641 Malignant neoplasm of right kidney, except renal pelvis: Secondary | ICD-10-CM | POA: Diagnosis not present

## 2023-01-08 DIAGNOSIS — N1832 Chronic kidney disease, stage 3b: Secondary | ICD-10-CM

## 2023-01-08 DIAGNOSIS — D631 Anemia in chronic kidney disease: Secondary | ICD-10-CM | POA: Diagnosis not present

## 2023-01-08 DIAGNOSIS — D509 Iron deficiency anemia, unspecified: Secondary | ICD-10-CM

## 2023-01-08 NOTE — Progress Notes (Signed)
[facility]  Telephone:([phone] [phone]  Virtual Visit Progress Note  I connected with [name] on 01/08/23 at  1:00 PM EDT by video enabled telemedicine visit and verified that I am speaking with the correct person using two identifiers.   I discussed the limitations, risks, security and privacy concerns of performing an evaluation and management service by telemedicine and the availability of in-person appointments. I also discussed with the patient that there may be a patient responsible charge related to this service. The patient expressed understanding and agreed to proceed.   Other persons participating in the visit and their role in the encounter: none   Patient's location: home  Provider's location: clinic   ID: Dedra Meiser OB: 28-Jan-1946  MR#: [id number]  ACZ#:[id number]  Patient Care Team: Glori Luis, MD as PCP - General (Family Medicine) Debbe Odea, MD as PCP - Cardiology (Cardiology) Shelia Media, MD (Internal Medicine) Purcell Nails, MD (Inactive) as Consulting Physician (Cardiothoracic Surgery) Othella Boyer, MD as Consulting Physician (Cardiology) Jeralyn Ruths, MD as Consulting Physician (Oncology)  CHIEF COMPLAINT: Iron deficiency anemia, right renal mass.  INTERVAL HISTORY: Mrs. Bibey is a 77 year-old female with iron deficiency anemia who agrees to evaluation via telemedicine for follow up of her iron deficiency anemia. She last received IV feraheme in February 2024. Denies black or bloody stools. Is unaware of any blood loss. Denies any neurologic complaints. Denies recent fevers or illnesses. Denies any easy bleeding or bruising. No melena or hematochezia. No pica or restless leg. Reports good appetite and denies weight loss. Denies chest pain. Denies any nausea, vomiting, constipation, or diarrhea. Denies urinary complaints. Patient offers no further specific complaints  today. 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   REVIEW OF SYSTEMS:   Review of Systems  Constitutional:  Positive for malaise/fatigue. Negative for fever and weight loss.  Respiratory:  Negative for cough and shortness of breath.   Cardiovascular:  Negative for chest pain and leg swelling.  Gastrointestinal:  Negative for abdominal pain, blood in stool and melena.  Genitourinary:  Negative for hematuria.  Musculoskeletal:  Negative for joint pain.  Skin:  Negative for rash.  Neurological:  Negative for dizziness, sensory change, focal weakness, weakness and headaches.  Psychiatric/Behavioral:  Negative for depression. The patient is not nervous/anxious and does not have insomnia.   As per HPI. Otherwise, a complete review of systems is negative.  PAST MEDICAL HISTORY: Past Medical History:  Diagnosis Date   A-fib (HCC)    Anemia    Anginal pain (HCC)    Anxiety    Arthritis    CAD (coronary artery disease)    Cancer (HCC)    Carpal tunnel syndrome    bilateral   Cataract    bil removed cataracts   Coronary artery disease    2x stents, Dr. Juliann Pares   Depression    Dysrhythmia    Family history of adverse reaction to anesthesia    "brother; S/P lipotripsy in [place]; transferred to [facility]; had to put him on life support for 2 days"   GERD (gastroesophageal reflux disease)    Headache    "weekly" (09/13/2014)   History of blood transfusion 05/2014   "we haven't figured out why I needed it yet" (09/13/2014)   History of hiatal hernia    Hyperglycemia    Hyperlipidemia    Hypertension    Kidney stone  Myocardial infarct [facility])    Myocardial infarct, old 1999   15 years ago   Obesity    PONV (postoperative nausea and vomiting)    Renal mass    S/P CABG x 4 09/18/2014   LIMA to LAD, SVG to PDA-dRCA sequentially, SVG to OM, EVH via right thigh    Sleep apnea    Sleep apnea    "don't use a mask" (09/13/2014)    Unstable angina (HCC)     PAST SURGICAL HISTORY: Past Surgical History:  Procedure Laterality Date   ABDOMINAL HYSTERECTOMY     APPENDECTOMY     BACK SURGERY     BLADDER SURGERY     CARDIAC CATHETERIZATION  06/2014   CARDIAC CATHETERIZATION N/A 06/16/2016   Procedure: Left Heart Cath and Cors/Grafts Angiography;  Surgeon: Kathleene Hazel, MD;  Location: [facility] INVASIVE CV LAB;  Service: Cardiovascular;  Laterality: N/A;   CARPAL TUNNEL RELEASE Left    CATARACT EXTRACTION W/ INTRAOCULAR LENS  IMPLANT, BILATERAL     CHOLECYSTECTOMY     COLONOSCOPY  2022   MS-MAC-prep good-tics/multiple frag of TA-1 yr recall   CORONARY ANGIOPLASTY WITH STENT PLACEMENT  1999   [facility] x2 stent   CORONARY ARTERY BYPASS GRAFT N/A 09/18/2014   Procedure: CORONARY ARTERY BYPASS GRAFTING (CABG)times four using LIMA  to LAD:SVG to  PD and DIST RCA;SVG to OM;, EVH Right thigh;  Surgeon: Purcell Nails, MD;  Location: [facility] OR;  Service: Open Heart Surgery;  Laterality: N/A;   CYSTOSCOPY W/ RETROGRADES Left 03/15/2020   Procedure: CYSTOSCOPY WITH RETROGRADE PYELOGRAM;  Surgeon: Sondra Come, MD;  Location: [facility] ORS;  Service: Urology;  Laterality: Left;   CYSTOSCOPY WITH STENT PLACEMENT Left 02/20/2020   Procedure: CYSTOSCOPY WITH STENT PLACEMENT;  Surgeon: Sondra Come, MD;  Location: [facility] ORS;  Service: Urology;  Laterality: Left;   CYSTOSCOPY/URETEROSCOPY/HOLMIUM LASER/STENT PLACEMENT Left 03/15/2020   Procedure: CYSTOSCOPY/URETEROSCOPY;  Surgeon: Sondra Come, MD;  Location: [facility] ORS;  Service: Urology;  Laterality: Left;   ESOPHAGOGASTRODUODENOSCOPY N/A 09/17/2014   Procedure: ESOPHAGOGASTRODUODENOSCOPY (EGD);  Surgeon: Louis Meckel, MD;  Location[facility] ENDOSCOPY;  Service: Endoscopy;  Laterality: N/A;   heart stents     1999   INCONTINENCE SURGERY     INGUINAL HERNIA REPAIR     INGUINAL HERNIA REPAIR Right X 3   "last one was in the 1990's; still have hernia there now" (09/13/2014)    LAPAROSCOPIC CHOLECYSTECTOMY     LUMBAR DISC SURGERY  1990's?   LUMBAR SPINE SURGERY     NEPHRECTOMY Right    removal of ovaries     TEE WITHOUT CARDIOVERSION N/A 09/18/2014   Procedure: TRANSESOPHAGEAL ECHOCARDIOGRAM (TEE);  Surgeon: Purcell Nails, MD;  Location: [facility] OR;  Service: Open Heart Surgery;  Laterality: N/A;   UPPER GASTROINTESTINAL ENDOSCOPY      FAMILY HISTORY: Family History  Problem Relation Age of Onset   Coronary artery disease Mother    Osteoporosis Mother    Heart disease Mother    Stroke Mother    Heart attack Mother    Hypertension Mother    Hypertension Father    Coronary artery disease Father    COPD Father    Heart disease Father    Heart attack Father    Glaucoma Father    Osteoporosis Father    Emphysema Father    Cancer Sister 43       colon   Hypertension Sister    Colon cancer Sister  dx in her 35's   Breast cancer Sister    Colon polyps Sister    Stroke Brother    Hypertension Brother    Colon polyps Brother    Stroke Maternal Grandfather    Stroke Maternal Grandmother    Osteoporosis Maternal Grandmother    Hypertension Maternal Grandmother    Hypertension Paternal Grandmother    Hypertension Paternal Grandfather    Breast cancer Maternal Aunt        2 aunts-50's   Esophageal cancer Neg Hx    Stomach cancer Neg Hx    Rectal cancer Neg Hx    Pancreatic cancer Neg Hx     ADVANCED DIRECTIVES (Y/N):  N  HEALTH MAINTENANCE: Social History   Tobacco Use   Smoking status: Never    Passive exposure: Never   Smokeless tobacco: Never  Vaping Use   Vaping status: Never Used  Substance Use Topics   Alcohol use: Not Currently    Alcohol/week: 0.0 standard drinks of alcohol   Drug use: No    Colonoscopy:  PAP:  Bone density:  Lipid panel:  Allergies  Allergen Reactions   Ferrous Fumarate Other (See Comments)    STOMACH ISSUES STOMACH ISSUES STOMACH ISSUES   Lipitor [Atorvastatin] Other (See Comments)    myalgia     Current Outpatient Medications  Medication Sig Dispense Refill   acetaminophen (TYLENOL) 500 MG tablet Take 500 mg by mouth every 6 (six) hours as needed for headache.     Alirocumab (PRALUENT) 75 MG/ML SOAJ Inject 1 mL (75 mg total) into the skin every 14 (fourteen) days. 2 mL 2   amLODipine (NORVASC) 5 MG tablet TAKE 1 TABLET BY MOUTH ONCE DAILY 90 tablet 3   aspirin EC 81 MG tablet Take 1 tablet (81 mg total) by mouth daily.     Blood Pressure Monitoring (BLOOD PRESSURE KIT) DEVI Check blood pressure daily, brand at patient and insurance preference, dx code I10 1 each 0   Docusate Sodium (DSS) 100 MG CAPS Take 1 capsule by mouth daily at 6 (six) AM.     ezetimibe (ZETIA) 10 MG tablet Take 1 tablet (10 mg total) by mouth daily. 90 tablet 3   gentamicin ointment (GARAMYCIN) 0.1 % Apply 1 Application topically as needed (to nose as needed).     guaiFENesin-codeine 100-10 MG/5ML syrup Take 10 mLs by mouth 3 (three) times daily as needed for cough. 120 mL 0   isosorbide mononitrate (IMDUR) 30 MG 24 hr tablet Take 1 tablet by mouth once daily 90 tablet 1   losartan (COZAAR) 100 MG tablet Take 100 mg by mouth daily.     metoprolol succinate (TOPROL-XL) 50 MG 24 hr tablet TAKE 1 TABLET BY MOUTH ONCE DAILY MUST  KEEP  SCHEDULED  APPT  FOR  ADDITIONAL  REFILLS 45 tablet 0   nitroGLYCERIN (NITROSTAT) 0.4 MG SL tablet Place 1 tablet (0.4 mg total) under the tongue every 5 (five) minutes as needed for chest pain. Take for up to 3 doses and then call 911 if still having chest pain. 50 tablet 3   ondansetron (ZOFRAN) 4 MG tablet Take 1 tablet (4 mg total) by mouth every 8 (eight) hours as needed for up to 10 doses for nausea or vomiting. 10 tablet 0   pantoprazole (PROTONIX) 40 MG tablet Take 1 tablet by mouth once daily 90 tablet 0   rosuvastatin (CRESTOR) 40 MG tablet Take 1 tablet by mouth once daily 90 tablet 0   Sodium  Sulfate-Mag Sulfate-KCl (SUTAB) [phone] MG TABS Take 1 kit by mouth as  directed. Patient to bring in coupon 24 tablet 0   No current facility-administered medications for this visit.    OBJECTIVE: There were no vitals filed for this visit.  There is no height or weight on file to calculate BMI.     ECOG FS:0 - Asymptomatic  Physical Exam Constitutional:      Appearance: She is not ill-appearing.  Pulmonary:     Effort: Pulmonary effort is normal.  Skin:    Coloration: Skin is not pale.  Neurological:     Mental Status: She is alert and oriented to person, place, and time.  Psychiatric:        Mood and Affect: Mood normal.        Behavior: Behavior normal.    LAB RESULTS: Lab Results  Component Value Date   NA 140 07/18/2021   K 5.0 07/24/2021   CL 103 07/18/2021   CO2 23 07/18/2021   GLUCOSE 100 (H) 07/18/2021   BUN 30 (H) 07/18/2021   CREATININE 1.49 (H) 07/18/2021   CALCIUM 9.3 07/18/2021   PROT 6.9 07/07/2021   ALBUMIN 4.4 07/07/2021   AST 20 07/07/2021   ALT 17 07/07/2021   ALKPHOS 51 07/07/2021   BILITOT 0.4 07/07/2021   GFRNONAA 51 (L) 10/04/2020   GFRAA >60 02/24/2020   Lab Results  Component Value Date   WBC 5.1 01/06/2023   NEUTROABS 3.0 01/06/2023   HGB 13.9 01/06/2023   HCT 43.3 01/06/2023   MCV 97.7 01/06/2023   PLT 160 01/06/2023   Lab Results  Component Value Date   IRON 88 01/06/2023   TIBC 342 01/06/2023   IRONPCTSAT 26 01/06/2023   Lab Results  Component Value Date   FERRITIN 56 01/06/2023     STUDIES: No results found.   ASSESSMENT: Iron deficiency anemia.  PLAN:    1. Iron deficiency anemia: Etiology unclear- likely multifactorial- CKD vs bleeding vs malabsorption (hernia) vs others. Endoscopy and colonoscopy have been negative for GI bleed. Colonoscopy 06/02/22 did reveal diverticula w/o evidence of bleeding and moderate grade I hemorrhoids. Endoscopy 01/14/21 revealed medium hiatal hernia and erosions w/o evidence of bleeding. Last received IV feraheme x 2 in February 2024. Tolerated well.  Today, hmg 13.6, ferritin 56, iron sat 26%.  No iv iron for now as stores are well replenished. Reevaluate in 6 months.   2.  Right renal cell carcinoma: Patient underwent partial nephrectomy on 07/04/2020 at [facility] revealing stage I malignancy.  She follows up with [facility] every 6 months.  3. CKD- stage 3b- managed by Dr. Cherylann Ratel. Gfr 35. Reviewed role of CKD in anemia. No role for EPO currently.   Disposition:  No iron 6 mo- labs (cbc, ferritin, iron studies) - Friday Week later- Virtual visit with me for results- la  I discussed the assessment and treatment plan with the patient. The patient was provided an opportunity to ask questions and all were answered. The patient agreed with the plan and demonstrated an understanding of the instructions.   The patient was advised to call back or seek an in-person evaluation if the symptoms worsen or if the condition fails to improve as anticipated.   I spent 15 minutes face-to-face video visit time dedicated to the care of this patient on the date of this encounter to include pre-visit review of labs, prior notes, face-to-face time with the patient, and post visit ordering of testing/documentation.   Alinda Dooms,  NP   01/08/2023

## 2023-01-17 ENCOUNTER — Other Ambulatory Visit: Payer: Self-pay | Admitting: Family Medicine

## 2023-01-18 ENCOUNTER — Other Ambulatory Visit (HOSPITAL_COMMUNITY): Payer: Self-pay

## 2023-01-18 ENCOUNTER — Encounter: Payer: Self-pay | Admitting: Family Medicine

## 2023-01-18 ENCOUNTER — Telehealth: Payer: Self-pay

## 2023-01-18 ENCOUNTER — Encounter: Payer: Self-pay | Admitting: Oncology

## 2023-01-18 NOTE — Telephone Encounter (Signed)
Spoke to pharmacy. Pharmacy stated there was nothing needed at this time from [place].

## 2023-01-18 NOTE — Telephone Encounter (Signed)
Pharmacy Patient Advocate Encounter   Received notification from Patient Advice Request messages that prior authorization for Praluent 75MG /ML auto-injectors is required/requested.   Insurance verification completed.   The patient is insured through [employer] .   Per test claim: APPROVED from 12/19/22 to 01/18/24. Ran test claim, Copay is $447.93. This test claim was processed through [facility]- copay amounts may vary at other pharmacies due to pharmacy/plan contracts, or as the patient moves through the different stages of their insurance plan.   Key: [id number]

## 2023-01-18 NOTE — Telephone Encounter (Signed)
Praluent, Dr. De Nurse

## 2023-01-19 ENCOUNTER — Encounter: Payer: Self-pay | Admitting: Family Medicine

## 2023-01-20 NOTE — Telephone Encounter (Signed)
See other MyChart message

## 2023-01-20 NOTE — Telephone Encounter (Signed)
Noted  

## 2023-01-21 ENCOUNTER — Encounter: Payer: Self-pay | Admitting: Family Medicine

## 2023-02-05 ENCOUNTER — Other Ambulatory Visit: Payer: Self-pay | Admitting: Family Medicine

## 2023-02-09 ENCOUNTER — Encounter: Payer: Self-pay | Admitting: Family Medicine

## 2023-02-09 ENCOUNTER — Other Ambulatory Visit: Payer: Self-pay

## 2023-02-09 MED ORDER — ROSUVASTATIN CALCIUM 40 MG PO TABS: 40.0000 mg | ORAL_TABLET | Freq: Every day | ORAL | 3 refills | Status: AC

## 2023-02-12 ENCOUNTER — Other Ambulatory Visit (INDEPENDENT_AMBULATORY_CARE_PROVIDER_SITE_OTHER): Payer: Medicare Other

## 2023-02-12 DIAGNOSIS — I2510 Atherosclerotic heart disease of native coronary artery without angina pectoris: Secondary | ICD-10-CM

## 2023-02-12 DIAGNOSIS — E785 Hyperlipidemia, unspecified: Secondary | ICD-10-CM

## 2023-02-12 LAB — LIPID PANEL
Cholesterol: 171 mg/dL (ref 0–200)
HDL: 47.6 mg/dL (ref >39.00)
LDL Cholesterol: 93 mg/dL (ref 0–99)
NonHDL: 123.42
Total CHOL/HDL Ratio: 4
Triglycerides: 154 mg/dL — ABNORMAL HIGH (ref 0.0–149.0)
VLDL: 30.8 mg/dL (ref 0.0–40.0)

## 2023-02-12 LAB — HEPATIC FUNCTION PANEL
ALT: 13 U/L (ref 0–35)
AST: 15 U/L (ref 0–37)
Albumin: 4.2 g/dL (ref 3.5–5.2)
Alkaline Phosphatase: 48 U/L (ref 39–117)
Bilirubin, Direct: 0.1 mg/dL (ref 0.0–0.3)
Total Bilirubin: 0.7 mg/dL (ref 0.2–1.2)
Total Protein: 7.1 g/dL (ref 6.0–8.3)

## 2023-02-16 ENCOUNTER — Other Ambulatory Visit: Payer: Self-pay | Admitting: Family Medicine

## 2023-02-18 ENCOUNTER — Other Ambulatory Visit: Payer: Self-pay | Admitting: Family Medicine

## 2023-02-18 DIAGNOSIS — Z1231 Encounter for screening mammogram for malignant neoplasm of breast: Secondary | ICD-10-CM

## 2023-02-23 ENCOUNTER — Other Ambulatory Visit: Payer: Self-pay | Admitting: Gastroenterology

## 2023-02-23 DIAGNOSIS — K2270 Barrett's esophagus without dysplasia: Secondary | ICD-10-CM

## 2023-02-23 DIAGNOSIS — K2970 Gastritis, unspecified, without bleeding: Secondary | ICD-10-CM

## 2023-02-26 ENCOUNTER — Ambulatory Visit: Payer: Medicare Other | Admitting: Family Medicine

## 2023-03-02 DIAGNOSIS — N2581 Secondary hyperparathyroidism of renal origin: Secondary | ICD-10-CM | POA: Diagnosis not present

## 2023-03-02 DIAGNOSIS — I10 Essential (primary) hypertension: Secondary | ICD-10-CM | POA: Diagnosis not present

## 2023-03-02 DIAGNOSIS — N1832 Chronic kidney disease, stage 3b: Secondary | ICD-10-CM | POA: Diagnosis not present

## 2023-03-05 ENCOUNTER — Encounter: Payer: Self-pay | Admitting: Family Medicine

## 2023-03-05 ENCOUNTER — Ambulatory Visit: Payer: Medicare Other | Admitting: Family Medicine

## 2023-03-05 VITALS — BP 120/80 | HR 68 | Temp 98.1°F | Ht 62.0 in | Wt 181.0 lb

## 2023-03-05 DIAGNOSIS — I2510 Atherosclerotic heart disease of native coronary artery without angina pectoris: Secondary | ICD-10-CM | POA: Diagnosis not present

## 2023-03-05 DIAGNOSIS — Z23 Encounter for immunization: Secondary | ICD-10-CM | POA: Diagnosis not present

## 2023-03-05 DIAGNOSIS — Z6833 Body mass index (BMI) 33.0-33.9, adult: Secondary | ICD-10-CM

## 2023-03-05 DIAGNOSIS — E7800 Pure hypercholesterolemia, unspecified: Secondary | ICD-10-CM

## 2023-03-05 DIAGNOSIS — E66811 Obesity, class 1: Secondary | ICD-10-CM

## 2023-03-05 MED ORDER — REPATHA SURECLICK 140 MG/ML ~~LOC~~ SOAJ: 140.0000 mg | SUBCUTANEOUS | 2 refills | Status: DC

## 2023-03-05 NOTE — Progress Notes (Signed)
Marikay Alar, MD Phone: [phone]  Debbie Delacruz is a 77 y.o. female who presents today for f/u.  CAD: Patient is on Zetia, aspirin, losartan, amlodipine, and Crestor.  No chest pain or shortness of breath.  She notes he is willing to retry the Repatha.  In the past when she tried Repatha she developed a bruise and discomfort at the area of the injection.  She does have chronic cramping issues and thinks that cramps are maybe a little bit worse though is unsure if they were a lot worse.  Obesity: Patient been walking nightly for 10 to 15 minutes.  Only drinking water.  Limiting fried foods.  Has a healthy breakfast.  Has peanut butter crackers at lunch.  Has a variety of things at dinner.  Eats vegetables though not many fruits.  Social History   Tobacco Use  Smoking Status Never   Passive exposure: Never  Smokeless Tobacco Never    Current Outpatient Medications on File Prior to Visit  Medication Sig Dispense Refill   acetaminophen (TYLENOL) 500 MG tablet Take 500 mg by mouth every 6 (six) hours as needed for headache.     amLODipine (NORVASC) 5 MG tablet Take 1 tablet by mouth once daily 90 tablet 1   aspirin EC 81 MG tablet Take 1 tablet (81 mg total) by mouth daily.     Blood Pressure Monitoring (BLOOD PRESSURE KIT) DEVI Check blood pressure daily, brand at patient and insurance preference, dx code I10 1 each 0   Docusate Sodium (DSS) 100 MG CAPS Take 1 capsule by mouth daily at 6 (six) AM.     ezetimibe (ZETIA) 10 MG tablet Take 1 tablet (10 mg total) by mouth daily. 90 tablet 3   gentamicin ointment (GARAMYCIN) 0.1 % Apply 1 Application topically as needed (to nose as needed).     isosorbide mononitrate (IMDUR) 30 MG 24 hr tablet Take 1 tablet by mouth once daily 90 tablet 1   losartan (COZAAR) 100 MG tablet Take 100 mg by mouth daily.     metoprolol succinate (TOPROL-XL) 50 MG 24 hr tablet TAKE 1 TABLET BY MOUTH ONCE DAILY (MUST  KEEP  SCHEDULED  APPOINTMENT  FOR   ADDITIONAL  REFILLS) 45 tablet 0   nitroGLYCERIN (NITROSTAT) 0.4 MG SL tablet Place 1 tablet (0.4 mg total) under the tongue every 5 (five) minutes as needed for chest pain. Take for up to 3 doses and then call 911 if still having chest pain. 50 tablet 3   ondansetron (ZOFRAN) 4 MG tablet Take 1 tablet (4 mg total) by mouth every 8 (eight) hours as needed for up to 10 doses for nausea or vomiting. 10 tablet 0   pantoprazole (PROTONIX) 40 MG tablet Take 1 tablet by mouth once daily 90 tablet 0   rosuvastatin (CRESTOR) 40 MG tablet Take 1 tablet (40 mg total) by mouth daily. 90 tablet 3   Sodium Sulfate-Mag Sulfate-KCl (SUTAB) [phone] MG TABS Take 1 kit by mouth as directed. Patient to bring in coupon 24 tablet 0   No current facility-administered medications on file prior to visit.     ROS see history of present illness  Objective  Physical Exam Vitals:   03/05/23 0841 03/05/23 0848  BP: 126/82 120/80  Pulse: 68   Temp: 98.1 F (36.7 C)   SpO2: 99%     BP Readings from Last 3 Encounters:  03/05/23 120/80  12/18/22 132/82  07/21/22 (!) 174/92   Wt Readings from Last 3  Encounters:  03/05/23 181 lb (82.1 kg)  07/21/22 179 lb (81.2 kg)  07/02/22 184 lb (83.5 kg)    Physical Exam Constitutional:      General: She is not in acute distress.    Appearance: She is not diaphoretic.  Cardiovascular:     Rate and Rhythm: Normal rate and regular rhythm.     Heart sounds: Normal heart sounds.  Pulmonary:     Effort: Pulmonary effort is normal.     Breath sounds: Normal breath sounds.  Skin:    General: Skin is warm and dry.  Neurological:     Mental Status: She is alert.      Assessment/Plan: Please see individual problem list.  Obesity (BMI 30.0-34.9) Assessment & Plan: Chronic issue. She will increase her exercise.  She will continue to healthy diet.  Discussed potential for weight loss medication if she is not able to lose 1 to 2 pounds a week with increasing her  exercise.  Follow-up in 2 months.   Coronary artery disease involving native coronary artery of native heart without angina pectoris Assessment & Plan: Chronic issue.  Continue risk factor management.   Pure hypercholesterolemia Assessment & Plan: Chronic issue.  Discussed LDL goal of less than 55.  She will continue Crestor 40 mg daily and Zetia 10 mg daily.  We will start Repatha 140 mg every 2 weeks.  If she is not able to tolerate this she will discontinue use of it and let us know.  Orders: -     Repatha SureClick; Inject 140 mg into the skin every 14 (fourteen) days.  Dispense: 2 mL; Refill: 2    Return in about 2 months (around 05/05/2023) for Follow-up weight.   Marikay Alar, MD [facility]

## 2023-03-05 NOTE — Assessment & Plan Note (Signed)
Chronic issue.  Discussed LDL goal of less than 55.  She will continue Crestor 40 mg daily and Zetia 10 mg daily.  We will start Repatha 140 mg every 2 weeks.  If she is not able to tolerate this she will discontinue use of it and let us know.

## 2023-03-05 NOTE — Assessment & Plan Note (Addendum)
Chronic issue. She will increase her exercise.  She will continue to healthy diet.  Discussed potential for weight loss medication if she is not able to lose 1 to 2 pounds a week with increasing her exercise.  Follow-up in 2 months.

## 2023-03-05 NOTE — Patient Instructions (Signed)
I sent in Repatha.  If it is excessively expensive or if it causes side effects please let us know.  You can discontinue it if you have side effects.

## 2023-03-05 NOTE — Assessment & Plan Note (Signed)
Chronic issue.  Continue risk factor management. 

## 2023-03-05 NOTE — Addendum Note (Signed)
Addended by: Prince Solian A on: 03/05/2023 08:53 AM   Modules accepted: Orders

## 2023-03-08 ENCOUNTER — Telehealth: Payer: Self-pay

## 2023-03-08 ENCOUNTER — Other Ambulatory Visit (HOSPITAL_COMMUNITY): Payer: Self-pay

## 2023-03-08 NOTE — Telephone Encounter (Signed)
Pharmacy Patient Advocate Encounter   Received notification from Physician's Office that prior authorization for Repatha is required/requested.   Insurance verification completed.   The patient is insured through  [employer]  .   Per test claim: PA required; PA submitted to [employer] via CoverMyMeds Key/confirmation #/EOC Key: [id number] Status is pending

## 2023-03-10 ENCOUNTER — Encounter: Payer: Self-pay | Admitting: Family Medicine

## 2023-03-10 NOTE — Telephone Encounter (Signed)
Sent to PA team

## 2023-03-15 ENCOUNTER — Other Ambulatory Visit (HOSPITAL_COMMUNITY): Payer: Self-pay

## 2023-03-15 NOTE — Telephone Encounter (Signed)
Left message to call the office back regarding the PA.

## 2023-03-15 NOTE — Telephone Encounter (Signed)
Pharmacy Patient Advocate Encounter  Received notification from [employer] that Prior Authorization for REPATHA has been APPROVED from 9.14.24 to 10.14.25. Ran test claim, Copay is $489.62. This test claim was processed through [facility]- copay amounts may vary at other pharmacies due to pharmacy/plan contracts, or as the patient moves through the different stages of their insurance plan.   PA #/Case ID/Reference #: (Key: [id number])

## 2023-03-18 ENCOUNTER — Encounter: Payer: Self-pay | Admitting: Family Medicine

## 2023-03-26 ENCOUNTER — Ambulatory Visit (INDEPENDENT_AMBULATORY_CARE_PROVIDER_SITE_OTHER): Payer: Medicare Other

## 2023-03-26 DIAGNOSIS — Z0000 Encounter for general adult medical examination without abnormal findings: Secondary | ICD-10-CM | POA: Diagnosis not present

## 2023-03-26 NOTE — Progress Notes (Signed)
Subjective:   Debbie Delacruz is a 77 y.o. female who presents for Medicare Annual (Subsequent) preventive examination.  Visit Complete: Virtual I connected with  Debbie Delacruz on 03/26/23 by a audio enabled telemedicine application and verified that I am speaking with the correct person using two identifiers.  Patient Location: Home  Provider Location: Office/Clinic  I discussed the limitations of evaluation and management by telemedicine. The patient expressed understanding and agreed to proceed.  Vital Signs: Because this visit was a virtual/telehealth visit, some criteria may be missing or patient reported. Any vitals not documented were not able to be obtained and vitals that have been documented are patient reported.   Cardiac Risk Factors include: advanced age (>30men, >88 women);dyslipidemia;hypertension;sedentary lifestyle;obesity (BMI >30kg/m2)     Objective:    There were no vitals filed for this visit. There is no height or weight on file to calculate BMI.     03/26/2023    8:15 AM 01/08/2023   12:52 PM 07/02/2022    2:03 PM 02/27/2022   11:31 AM 02/25/2021    9:41 AM 02/14/2021    1:05 PM 10/01/2020    1:20 AM  Advanced Directives  Does Patient Have a Medical Advance Directive? No Yes Yes Yes Yes Yes No  Type of Aeronautical engineer of [place];Living will Healthcare Power of [place];Living will Healthcare Power of [place];Living will   Does patient want to make changes to medical advance directive?    No - Patient declined  No - Patient declined   Copy of Healthcare Power of Attorney in Chart?    No - copy requested No - copy requested No - copy requested   Would patient like information on creating a medical advance directive? No - Patient declined     No - Patient declined No - Patient declined    Current Medications (verified) Outpatient Encounter Medications as of 03/26/2023  Medication Sig   acetaminophen (TYLENOL) 500 MG tablet  Take 500 mg by mouth every 6 (six) hours as needed for headache.   amLODipine (NORVASC) 5 MG tablet Take 1 tablet by mouth once daily   aspirin EC 81 MG tablet Take 1 tablet (81 mg total) by mouth daily.   Blood Pressure Monitoring (BLOOD PRESSURE KIT) DEVI Check blood pressure daily, brand at patient and insurance preference, dx code I10   Docusate Sodium (DSS) 100 MG CAPS Take 1 capsule by mouth daily at 6 (six) AM.   ezetimibe (ZETIA) 10 MG tablet Take 1 tablet (10 mg total) by mouth daily.   gentamicin ointment (GARAMYCIN) 0.1 % Apply 1 Application topically as needed (to nose as needed).   isosorbide mononitrate (IMDUR) 30 MG 24 hr tablet Take 1 tablet by mouth once daily   losartan (COZAAR) 100 MG tablet Take 100 mg by mouth daily.   metoprolol succinate (TOPROL-XL) 50 MG 24 hr tablet TAKE 1 TABLET BY MOUTH ONCE DAILY (MUST  KEEP  SCHEDULED  APPOINTMENT  FOR  ADDITIONAL  REFILLS)   nitroGLYCERIN (NITROSTAT) 0.4 MG SL tablet Place 1 tablet (0.4 mg total) under the tongue every 5 (five) minutes as needed for chest pain. Take for up to 3 doses and then call 911 if still having chest pain.   pantoprazole (PROTONIX) 40 MG tablet Take 1 tablet by mouth once daily   rosuvastatin (CRESTOR) 40 MG tablet Take 1 tablet (40 mg total) by mouth daily.   Evolocumab (REPATHA SURECLICK) 140 MG/ML SOAJ Inject 140 mg  into the skin every 14 (fourteen) days. (Patient not taking: Reported on 03/26/2023)   ondansetron (ZOFRAN) 4 MG tablet Take 1 tablet (4 mg total) by mouth every 8 (eight) hours as needed for up to 10 doses for nausea or vomiting. (Patient not taking: Reported on 03/26/2023)   Sodium Sulfate-Mag Sulfate-KCl (SUTAB) [phone] MG TABS Take 1 kit by mouth as directed. Patient to bring in coupon (Patient not taking: Reported on 03/26/2023)   No facility-administered encounter medications on file as of 03/26/2023.    Allergies (verified) Ferrous fumarate and Lipitor [atorvastatin]   History: Past  Medical History:  Diagnosis Date   A-fib (HCC)    Anemia    Anginal pain (HCC)    Anxiety    Arthritis    CAD (coronary artery disease)    Cancer (HCC)    Carpal tunnel syndrome    bilateral   Cataract    bil removed cataracts   Coronary artery disease    2x stents, Dr. Juliann Pares   Depression    Dysrhythmia    Family history of adverse reaction to anesthesia    "brother; S/P lipotripsy in [place]; transferred to [facility]; had to put him on life support for 2 days"   GERD (gastroesophageal reflux disease)    Headache    "weekly" (09/13/2014)   History of blood transfusion 05/2014   "we haven't figured out why I needed it yet" (09/13/2014)   History of hiatal hernia    Hyperglycemia    Hyperlipidemia    Hypertension    Kidney stone    Myocardial infarct (HCC)    Myocardial infarct, old 1999   15 years ago   Obesity    PONV (postoperative nausea and vomiting)    Renal mass    S/P CABG x 4 09/18/2014   LIMA to LAD, SVG to PDA-dRCA sequentially, SVG to OM, EVH via right thigh    Sleep apnea    Sleep apnea    "don't use a mask" (09/13/2014)   Unstable angina [facility])    Past Surgical History:  Procedure Laterality Date   ABDOMINAL HYSTERECTOMY     APPENDECTOMY     BACK SURGERY     BLADDER SURGERY     CARDIAC CATHETERIZATION  06/2014   CARDIAC CATHETERIZATION N/A 06/16/2016   Procedure: Left Heart Cath and Cors/Grafts Angiography;  Surgeon: Kathleene Hazel, MD;  Location[facility] INVASIVE CV LAB;  Service: Cardiovascular;  Laterality: N/A;   CARPAL TUNNEL RELEASE Left    CATARACT EXTRACTION W/ INTRAOCULAR LENS  IMPLANT, BILATERAL     CHOLECYSTECTOMY     COLONOSCOPY  2022   MS-MAC-prep good-tics/multiple frag of TA-1 yr recall   CORONARY ANGIOPLASTY WITH STENT PLACEMENT  1999   [facility] x2 stent   CORONARY ARTERY BYPASS GRAFT N/A 09/18/2014   Procedure: CORONARY ARTERY BYPASS GRAFTING (CABG)times four using LIMA  to LAD:SVG to  PD and DIST RCA;SVG to OM;, EVH Right  thigh;  Surgeon: Purcell Nails, MD;  Location: [facility] OR;  Service: Open Heart Surgery;  Laterality: N/A;   CYSTOSCOPY W/ RETROGRADES Left 03/15/2020   Procedure: CYSTOSCOPY WITH RETROGRADE PYELOGRAM;  Surgeon: Sondra Come, MD;  Location: [facility] ORS;  Service: Urology;  Laterality: Left;   CYSTOSCOPY WITH STENT PLACEMENT Left 02/20/2020   Procedure: CYSTOSCOPY WITH STENT PLACEMENT;  Surgeon: Sondra Come, MD;  Location: [facility] ORS;  Service: Urology;  Laterality: Left;   CYSTOSCOPY/URETEROSCOPY/HOLMIUM LASER/STENT PLACEMENT Left 03/15/2020   Procedure: CYSTOSCOPY/URETEROSCOPY;  Surgeon:  Sondra Come, MD;  Location: [facility] ORS;  Service: Urology;  Laterality: Left;   ESOPHAGOGASTRODUODENOSCOPY N/A 09/17/2014   Procedure: ESOPHAGOGASTRODUODENOSCOPY (EGD);  Surgeon: Louis Meckel, MD;  Location: [facility] ENDOSCOPY;  Service: Endoscopy;  Laterality: N/A;   heart stents     1999   INCONTINENCE SURGERY     INGUINAL HERNIA REPAIR     INGUINAL HERNIA REPAIR Right X 3   "last one was in the 1990's; still have hernia there now" (09/13/2014)   LAPAROSCOPIC CHOLECYSTECTOMY     LUMBAR DISC SURGERY  1990's?   LUMBAR SPINE SURGERY     NEPHRECTOMY Right    removal of ovaries     TEE WITHOUT CARDIOVERSION N/A 09/18/2014   Procedure: TRANSESOPHAGEAL ECHOCARDIOGRAM (TEE);  Surgeon: Purcell Nails, MD;  Location: [facility] OR;  Service: Open Heart Surgery;  Laterality: N/A;   UPPER GASTROINTESTINAL ENDOSCOPY     Family History  Problem Relation Age of Onset   Coronary artery disease Mother    Osteoporosis Mother    Heart disease Mother    Stroke Mother    Heart attack Mother    Hypertension Mother    Hypertension Father    Coronary artery disease Father    COPD Father    Heart disease Father    Heart attack Father    Glaucoma Father    Osteoporosis Father    Emphysema Father    Cancer Sister 67       colon   Hypertension Sister    Colon cancer Sister        dx in her 98's   Breast cancer Sister     Colon polyps Sister    Stroke Brother    Hypertension Brother    Colon polyps Brother    Stroke Maternal Grandfather    Stroke Maternal Grandmother    Osteoporosis Maternal Grandmother    Hypertension Maternal Grandmother    Hypertension Paternal Grandmother    Hypertension Paternal Grandfather    Breast cancer Maternal Aunt        2 aunts-50's   Esophageal cancer Neg Hx    Stomach cancer Neg Hx    Rectal cancer Neg Hx    Pancreatic cancer Neg Hx    Social History   Socioeconomic History   Marital status: Married    Spouse name: Not on file   Number of children: Not on file   Years of education: Not on file   Highest education level: Not on file  Occupational History   Not on file  Tobacco Use   Smoking status: Never    Passive exposure: Never   Smokeless tobacco: Never  Vaping Use   Vaping status: Never Used  Substance and Sexual Activity   Alcohol use: Not Currently    Alcohol/week: 0.0 standard drinks of alcohol   Drug use: No   Sexual activity: Not Currently    Birth control/protection: Post-menopausal  Other Topics Concern   Not on file  Social History Narrative   ** Merged History Encounter **       Lives in [place] with 10 YO nephew and husband. 3 dogs outside.  Work - Therapist, sports, [employer]  Diet - regular, limited meat  Exercise - walks some   Social Determinants of Health   Financial Resource Strain: Low Risk  (03/26/2023)   Overall Financial Resource Strain (CARDIA)    Difficulty of Paying Living Expenses: Not hard at all  Food Insecurity: No Food Insecurity (03/26/2023)  Hunger Vital Sign    Worried About Running Out of Food in the Last Year: Never true    Ran Out of Food in the Last Year: Never true  Transportation Needs: No Transportation Needs (03/26/2023)   PRAPARE - Administrator, Civil Service (Medical): No    Lack of Transportation (Non-Medical): No  Physical Activity: Insufficiently Active (03/26/2023)    Exercise Vital Sign    Days of Exercise per Week: 2 days    Minutes of Exercise per Session: 30 min  Stress: No Stress Concern Present (03/26/2023)   [employer] of Occupational Health - Occupational Stress Questionnaire    Feeling of Stress : Not at all  Social Connections: Socially Integrated (03/26/2023)   Social Connection and Isolation Panel [NHANES]    Frequency of Communication with Friends and Family: More than three times a week    Frequency of Social Gatherings with Friends and Family: Never    Attends Religious Services: More than 4 times per year    Active Member of [employer] or Organizations: Yes    Attends Banker Meetings: Never    Marital Status: Married    Tobacco Counseling Counseling given: Not Answered   Clinical Intake:  Pre-visit preparation completed: Yes  Pain : No/denies pain     Nutritional Status: BMI > 30  Obese Nutritional Risks: None Diabetes: No  How often do you need to have someone help you when you read instructions, pamphlets, or other written materials from your doctor or pharmacy?: 1 - Never  Interpreter Needed?: No  Information entered by :: Kennedy Bucker, LPN   Activities of Daily Living    03/26/2023    8:15 AM  In your present state of health, do you have any difficulty performing the following activities:  Hearing? 0  Vision? 0  Difficulty concentrating or making decisions? 0  Walking or climbing stairs? 0  Dressing or bathing? 0  Doing errands, shopping? 0  Preparing Food and eating ? N  Using the Toilet? N  In the past six months, have you accidently leaked urine? N  Do you have problems with loss of bowel control? N  Managing your Medications? N  Managing your Finances? N  Housekeeping or managing your Housekeeping? N    Patient Care Team: Glori Luis, MD as PCP - General (Family Medicine) Debbe Odea, MD as PCP - Cardiology (Cardiology) Shelia Media, MD (Internal  Medicine) Purcell Nails, MD (Inactive) as Consulting Physician (Cardiothoracic Surgery) Othella Boyer, MD as Consulting Physician (Cardiology) Jeralyn Ruths, MD as Consulting Physician (Oncology)  Indicate any recent Medical Services you may have received from other than [facility] providers in the past year (date may be approximate).     Assessment:   This is a routine wellness examination for [facility].  Hearing/Vision screen Hearing Screening - Comments:: No aids Vision Screening - Comments:: Wears glasses- [facility] in [place]    Goals Addressed             This Visit's Progress    DIET - EAT MORE FRUITS AND VEGETABLES         Depression Screen    03/26/2023    8:13 AM 03/05/2023    8:42 AM 02/27/2022   11:02 AM 11/10/2021    2:58 PM 07/07/2021    8:08 AM 05/06/2021    8:28 AM 02/25/2021    9:38 AM  PHQ 2/9 Scores  PHQ - 2 Score 0 0 0 0  0 2 0  PHQ- 9 Score 0 2    2     Fall Risk    03/26/2023    8:15 AM 03/05/2023    8:42 AM 02/27/2022   11:02 AM 11/10/2021    2:58 PM 07/07/2021    8:05 AM  Fall Risk   Falls in the past year? 0 0 0 0 0  Number falls in past yr: 0 0 0 0 0  Injury with Fall? 0 0 0 0 0  Risk for fall due to : No Fall Risks No Fall Risks No Fall Risks No Fall Risks No Fall Risks  Follow up Falls prevention discussed;Falls evaluation completed Falls evaluation completed Falls evaluation completed Falls evaluation completed Falls evaluation completed    MEDICARE RISK AT HOME: Medicare Risk at Home Any stairs in or around the home?: Yes If so, are there any without handrails?: Yes Home free of loose throw rugs in walkways, pet beds, electrical cords, etc?: Yes Adequate lighting in your home to reduce risk of falls?: Yes Life alert?: No Use of a cane, walker or w/c?: No Grab bars in the bathroom?: Yes Shower chair or bench in shower?: No Elevated toilet seat or a handicapped toilet?: Yes  TIMED UP AND GO:  Was the test performed?  No     Cognitive Function:        03/26/2023    8:17 AM 02/27/2022   11:32 AM  6CIT Screen  What Year? 0 points   What month? 0 points   What time? 0 points   Count back from 20 0 points   Months in reverse 0 points 0 points  Repeat phrase 0 points   Total Score 0 points     Immunizations Immunization History  Administered Date(s) Administered   Fluad Quad(high Dose 65+) 02/17/2019   Fluad Trivalent(High Dose 65+) 03/05/2023   Influenza Whole 04/30/2006   Influenza,inj,Quad PF,6+ Mos 03/28/2013, 03/09/2015   Influenza,inj,quad, With Preservative 03/25/2020   Influenza-Unspecified 03/14/2012, 05/04/2020   PFIZER(Purple Top)SARS-COV-2 Vaccination 07/16/2019, 08/09/2019, 05/04/2020   PNEUMOCOCCAL CONJUGATE-20 01/01/2021   Pneumococcal Polysaccharide-23 03/28/2013   Td 04/30/2006   Tdap 02/06/2021   Zoster, Live 05/09/2013    TDAP status: Up to date  Flu Vaccine status: Up to date  Pneumococcal vaccine status: Up to date  Covid-19 vaccine status: Completed vaccines  Qualifies for Shingles Vaccine? Yes   Zostavax completed Yes   Shingrix Completed?: No.    Education has been provided regarding the importance of this vaccine. Patient has been advised to call insurance company to determine out of pocket expense if they have not yet received this vaccine. Advised may also receive vaccine at local pharmacy or Health Dept. Verbalized acceptance and understanding.  Screening Tests Health Maintenance  Topic Date Due   Zoster Vaccines- Shingrix (1 of 2) 11/17/1966   COVID-19 Vaccine (4 - 2023-24 season) 01/24/2023   Medicare Annual Wellness (AWV)  03/25/2024   Colonoscopy  06/02/2025   DTaP/Tdap/Td (3 - Td or Tdap) 02/07/2031   Pneumonia Vaccine 74+ Years old  Completed   INFLUENZA VACCINE  Completed   DEXA SCAN  Completed   Hepatitis C Screening  Completed   HPV VACCINES  Aged Out    Health Maintenance  Health Maintenance Due  Topic Date Due   Zoster Vaccines-  Shingrix (1 of 2) 11/17/1966   COVID-19 Vaccine (4 - 2023-24 season) 01/24/2023    Colorectal cancer screening: Type of screening: Colonoscopy. Completed 06/02/22. Repeat every 3  years  Mammogram status: Completed SCHEDULED 04/09/23. Repeat every year  Bone Density status: Completed 10/17/19. Results reflect: Bone density results: OSTEOPENIA. Repeat every 5 years.  Lung Cancer Screening: (Low Dose CT Chest recommended if Age 57-80 years, 20 pack-year currently smoking OR have quit w/in 15years.) does not qualify.    Additional Screening:  Hepatitis C Screening: does qualify; Completed 10/09/15  Vision Screening: Recommended annual ophthalmology exams for early detection of glaucoma and other disorders of the eye. Is the patient up to date with their annual eye exam?  Yes  Who is the provider or what is the name of the office in which the patient attends annual eye exams? [facility] If pt is not established with a provider, would they like to be referred to a provider to establish care? No .   Dental Screening: Recommended annual dental exams for proper oral hygiene   Community Resource Referral / Chronic Care Management: CRR required this visit?  No   CCM required this visit?  No     Plan:     I have personally reviewed and noted the following in the patient's chart:   Medical and social history Use of alcohol, tobacco or illicit drugs  Current medications and supplements including opioid prescriptions. Patient is not currently taking opioid prescriptions. Functional ability and status Nutritional status Physical activity Advanced directives List of other physicians Hospitalizations, surgeries, and ER visits in previous 12 months Vitals Screenings to include cognitive, depression, and falls Referrals and appointments  In addition, I have reviewed and discussed with patient certain preventive protocols, quality metrics, and best practice recommendations. A written  personalized care plan for preventive services as well as general preventive health recommendations were provided to patient.     Hal Hope, LPN   [id number]/11/[id number]   After Visit Summary: (MyChart) Due to this being a telephonic visit, the after visit summary with patients personalized plan was offered to patient via MyChart   Nurse Notes: none

## 2023-03-26 NOTE — Patient Instructions (Addendum)
Debbie Delacruz , Thank you for taking time to come for your Medicare Wellness Visit. I appreciate your ongoing commitment to your health goals. Please review the following plan we discussed and let me know if I can assist you in the future.   Referrals/Orders/Follow-Ups/Clinician Recommendations: none  This is a list of the screening recommended for you and due dates:  Health Maintenance  Topic Date Due   Zoster (Shingles) Vaccine (1 of 2) 11/17/1966   COVID-19 Vaccine (4 - 2023-24 season) 01/24/2023   Medicare Annual Wellness Visit  03/25/2024   Colon Cancer Screening  06/02/2025   DTaP/Tdap/Td vaccine (3 - Td or Tdap) 02/07/2031   Pneumonia Vaccine  Completed   Flu Shot  Completed   DEXA scan (bone density measurement)  Completed   Hepatitis C Screening  Completed   HPV Vaccine  Aged Out    Advanced directives: (ACP Link)Information on Advanced Care Planning can be found at [facility] of [place] Advance Health Care Directives Advance Health Care Directives ([email or web address]   Next Medicare Annual Wellness Visit scheduled for next year: Yes   03/28/24 @ 4:10 pm by video

## 2023-04-03 ENCOUNTER — Other Ambulatory Visit: Payer: Self-pay | Admitting: Family Medicine

## 2023-04-03 ENCOUNTER — Encounter: Payer: Self-pay | Admitting: Oncology

## 2023-04-03 DIAGNOSIS — E785 Hyperlipidemia, unspecified: Secondary | ICD-10-CM

## 2023-04-06 ENCOUNTER — Other Ambulatory Visit: Payer: Self-pay | Admitting: Family Medicine

## 2023-04-29 ENCOUNTER — Telehealth: Payer: Self-pay

## 2023-04-29 NOTE — Patient Outreach (Signed)
  Care Coordination   04/29/2023 Name: Debbie Delacruz MRN: [id number] DOB: 27-Aug-1945   Care Coordination Outreach Attempts:  Successful contact made with patient.  Patient states she is unable to talk with RNCM at this time due to being a work. Request a return call on tomorrow 04/30/23.   Follow Up Plan:  Additional outreach attempts will be made to offer the patient care coordination information and services.   Encounter Outcome:  Patient Request to Call Back   Care Coordination Interventions:  No, not indicated    George Ina RN,BSN,CCM [facility]  [facility], [facility] coordinator / Case Manager Phone: [phone]

## 2023-04-30 ENCOUNTER — Encounter: Payer: Medicare Other | Admitting: Internal Medicine

## 2023-04-30 ENCOUNTER — Telehealth: Payer: Self-pay

## 2023-04-30 NOTE — Patient Instructions (Signed)
Visit Information  Thank you for taking time to visit with me today. Please don't hesitate to contact me if I can be of assistance to you.   What we discussed today:   Goals Addressed             This Visit's Progress    COMPLETED: care coordination activities - no follow up needed.       Interventions Today    Flowsheet Row Most Recent Value  Chronic Disease   Chronic disease during today's visit Other  [hypocholesteremia]  General Interventions   General Interventions Discussed/Reviewed General Interventions Discussed, Vaccines  [Care coordination services discussed. SDOH survey completed. Discussed and advised patient to consider taking vaccines. Advised to contact PCP office if care coordination services needed in the future.]  Vaccines Flu, Pneumonia, RSV  Education Interventions   Education Provided Provided Education, Provided Printed Education  [Advised patient to inform her primary care provider of the ongoing symptoms with her hip. Education article sent to patient on high cholesterol.]              Please contact your provider if care coordination services are needed in the future.   If you are experiencing a Mental Health or Behavioral Health Crisis or need someone to talk to, please call the Suicide and Crisis Lifeline: 988 call 1-800-273-TALK (toll free, 24 hour hotline)  Patient verbalizes understanding of instructions and care plan provided today and agrees to view in MyChart. Active MyChart status and patient understanding of how to access instructions and care plan via MyChart confirmed with patient.     George Ina RN,BSN,CCM [place]  [facility], [facility] coordinator / Case Manager Phone: [phone]

## 2023-04-30 NOTE — Patient Outreach (Signed)
  Care Coordination   Initial Visit Note   04/30/2023 Name: Debbie Delacruz MRN: [id number] DOB: 16-Dec-1945  Debbie Delacruz is a 77 y.o. year old female who sees Birdie Sons, Yehuda Mao, MD for primary care. I spoke with  [name] by phone today.  What matters to the patients health and wellness today?  Patient states her main concern is her cholesterol. She states her insurance will not cover the cholesterol medication that her doctor wants to put her on. Patient states she will have a new insurance provider May 26, 2023. Patient states otherwise she is able to manage her cholesterol at this time.  Patient states she is unable to exercise due to having a hip that will go out on her if she walks lengthy distance.  Patient denies having any nursing and/ or community resource needs at this time.     Goals Addressed             This Visit's Progress    COMPLETED: care coordination activities - no follow up needed.       Interventions Today    Flowsheet Row Most Recent Value  Chronic Disease   Chronic disease during today's visit Other  [hypocholesteremia]  General Interventions   General Interventions Discussed/Reviewed General Interventions Discussed, Vaccines  [Care coordination services discussed. SDOH survey completed. Discussed and advised patient to consider taking vaccines. Advised to contact PCP office if care coordination services needed in the future.]  Vaccines Flu, Pneumonia, RSV  Education Interventions   Education Provided Provided Education, Provided Printed Education  [Advised patient to inform her primary care provider of the ongoing symptoms with her hip. Education article sent to patient on high cholesterol.]              SDOH assessments and interventions completed:  Yes  SDOH Interventions Today    Flowsheet Row Most Recent Value  SDOH Interventions   Food Insecurity Interventions Intervention Not Indicated  Housing Interventions  Intervention Not Indicated  Transportation Interventions Intervention Not Indicated  Utilities Interventions Intervention Not Indicated        Care Coordination Interventions:  Yes, provided   Follow up plan: No further intervention required.   Encounter Outcome:  Patient Visit Completed   George Ina RN,BSN,CCM [facility]  Value[facility], [facility] coordinator / Case Manager Phone: [phone]

## 2023-05-03 ENCOUNTER — Telehealth: Payer: Self-pay | Admitting: Family Medicine

## 2023-05-03 NOTE — Telephone Encounter (Signed)
Noted  

## 2023-05-03 NOTE — Telephone Encounter (Signed)
Can you call the patient and see how she is feeling with her laryngitis like symptoms? Thanks.

## 2023-05-03 NOTE — Telephone Encounter (Signed)
Phone call to pt, per Dr. Purvis Sheffield request.  Pt answered and denied any issues the laryngitis or being sick.  She sounded well on my phone call.  No further needs made know.

## 2023-05-03 NOTE — Telephone Encounter (Signed)
-----   Message from Nurse Dwana Curd sent at 04/30/2023  3:43 PM EST ----- Regarding: patient update Hi Dr. Birdie Sons,  Just a quick update on your patient Debbie Delacruz.  I called her this afternoon for our case management follow up visit.  She was unable to talk on the call due to laryngitis like symptoms. Her husband reported that she started feeling bad on yesterday and has progressively gotten worse with  a cough and congestion.  I advised them to call your office today to report symptoms and she was also advised if symptoms worsen related to breathing or overall to call 911.   Have a great weekend,  George Ina RN,BSN,CCM [facility]  [facility], [facility] coordinator / Case Manager Phone: [phone]

## 2023-05-07 ENCOUNTER — Ambulatory Visit: Payer: Medicare Other | Admitting: Family Medicine

## 2023-05-18 ENCOUNTER — Other Ambulatory Visit: Payer: Self-pay | Admitting: Family Medicine

## 2023-05-21 ENCOUNTER — Ambulatory Visit: Payer: Medicare Other | Admitting: Family Medicine

## 2023-06-02 ENCOUNTER — Encounter: Payer: Self-pay | Admitting: Family Medicine

## 2023-06-04 ENCOUNTER — Encounter: Payer: Self-pay | Admitting: Oncology

## 2023-06-11 ENCOUNTER — Other Ambulatory Visit (HOSPITAL_COMMUNITY): Payer: Self-pay | Admitting: Internal Medicine

## 2023-06-11 ENCOUNTER — Ambulatory Visit: Payer: HMO | Admitting: Internal Medicine

## 2023-06-11 ENCOUNTER — Inpatient Hospital Stay (HOSPITAL_COMMUNITY)
Admission: RE | Admit: 2023-06-11 | Discharge: 2023-06-11 | Disposition: A | Discharge status: Home / Self Care | Payer: HMO | Source: Ambulatory Visit

## 2023-06-11 ENCOUNTER — Other Ambulatory Visit
Admission: RE | Admit: 2023-06-11 | Discharge: 2023-06-11 | Disposition: A | Discharge status: Home / Self Care | Payer: HMO | Source: Ambulatory Visit | Attending: Internal Medicine | Admitting: Internal Medicine

## 2023-06-11 ENCOUNTER — Encounter: Payer: Self-pay | Admitting: Oncology

## 2023-06-11 VITALS — BP 160/86 | HR 74 | Wt 187.1 lb

## 2023-06-11 DIAGNOSIS — R0609 Other forms of dyspnea: Secondary | ICD-10-CM | POA: Diagnosis not present

## 2023-06-11 DIAGNOSIS — Z6834 Body mass index (BMI) 34.0-34.9, adult: Secondary | ICD-10-CM | POA: Insufficient documentation

## 2023-06-11 DIAGNOSIS — D509 Iron deficiency anemia, unspecified: Secondary | ICD-10-CM | POA: Insufficient documentation

## 2023-06-11 DIAGNOSIS — I493 Ventricular premature depolarization: Secondary | ICD-10-CM | POA: Diagnosis not present

## 2023-06-11 DIAGNOSIS — E785 Hyperlipidemia, unspecified: Secondary | ICD-10-CM | POA: Insufficient documentation

## 2023-06-11 DIAGNOSIS — Z7982 Long term (current) use of aspirin: Secondary | ICD-10-CM | POA: Insufficient documentation

## 2023-06-11 DIAGNOSIS — I519 Heart disease, unspecified: Secondary | ICD-10-CM | POA: Diagnosis not present

## 2023-06-11 DIAGNOSIS — Z951 Presence of aortocoronary bypass graft: Secondary | ICD-10-CM | POA: Insufficient documentation

## 2023-06-11 DIAGNOSIS — Z01810 Encounter for preprocedural cardiovascular examination: Secondary | ICD-10-CM | POA: Diagnosis not present

## 2023-06-11 DIAGNOSIS — N2889 Other specified disorders of kidney and ureter: Secondary | ICD-10-CM | POA: Insufficient documentation

## 2023-06-11 DIAGNOSIS — M25551 Pain in right hip: Secondary | ICD-10-CM | POA: Diagnosis not present

## 2023-06-11 DIAGNOSIS — N1832 Chronic kidney disease, stage 3b: Secondary | ICD-10-CM

## 2023-06-11 DIAGNOSIS — G4733 Obstructive sleep apnea (adult) (pediatric): Secondary | ICD-10-CM | POA: Insufficient documentation

## 2023-06-11 DIAGNOSIS — I2510 Atherosclerotic heart disease of native coronary artery without angina pectoris: Secondary | ICD-10-CM

## 2023-06-11 DIAGNOSIS — I10 Essential (primary) hypertension: Secondary | ICD-10-CM

## 2023-06-11 DIAGNOSIS — I252 Old myocardial infarction: Secondary | ICD-10-CM | POA: Diagnosis not present

## 2023-06-11 DIAGNOSIS — G4719 Other hypersomnia: Secondary | ICD-10-CM

## 2023-06-11 DIAGNOSIS — E669 Obesity, unspecified: Secondary | ICD-10-CM | POA: Diagnosis not present

## 2023-06-11 DIAGNOSIS — R008 Other abnormalities of heart beat: Secondary | ICD-10-CM | POA: Diagnosis not present

## 2023-06-11 DIAGNOSIS — Z79899 Other long term (current) drug therapy: Secondary | ICD-10-CM | POA: Insufficient documentation

## 2023-06-11 DIAGNOSIS — I129 Hypertensive chronic kidney disease with stage 1 through stage 4 chronic kidney disease, or unspecified chronic kidney disease: Secondary | ICD-10-CM | POA: Insufficient documentation

## 2023-06-11 DIAGNOSIS — R0602 Shortness of breath: Secondary | ICD-10-CM | POA: Diagnosis present

## 2023-06-11 DIAGNOSIS — Z955 Presence of coronary angioplasty implant and graft: Secondary | ICD-10-CM | POA: Diagnosis not present

## 2023-06-11 DIAGNOSIS — Z905 Acquired absence of kidney: Secondary | ICD-10-CM | POA: Diagnosis not present

## 2023-06-11 LAB — TSH: TSH: 2.163 u[IU]/mL (ref 0.350–4.500)

## 2023-06-11 LAB — COMPREHENSIVE METABOLIC PANEL
ALT: 16 U/L (ref 0–44)
AST: 18 U/L (ref 15–41)
Albumin: 4.1 g/dL (ref 3.5–5.0)
Alkaline Phosphatase: 47 U/L (ref 38–126)
Anion gap: 8 (ref 5–15)
BUN: 31 mg/dL — ABNORMAL HIGH (ref 8–23)
CO2: 24 mmol/L (ref 22–32)
Calcium: 9.2 mg/dL (ref 8.9–10.3)
Chloride: 108 mmol/L (ref 98–111)
Creatinine, Ser: 1.6 mg/dL — ABNORMAL HIGH (ref 0.44–1.00)
GFR, Estimated: 33 mL/min — ABNORMAL LOW (ref >60)
Glucose, Bld: 112 mg/dL — ABNORMAL HIGH (ref 70–99)
Potassium: 5.2 mmol/L — ABNORMAL HIGH (ref 3.5–5.1)
Sodium: 140 mmol/L (ref 135–145)
Total Bilirubin: 0.7 mg/dL (ref 0.0–1.2)
Total Protein: 7.3 g/dL (ref 6.5–8.1)

## 2023-06-11 LAB — CBC
HCT: 44 % (ref 36.0–46.0)
Hemoglobin: 14.3 g/dL (ref 12.0–15.0)
MCH: 31.4 pg (ref 26.0–34.0)
MCHC: 32.5 g/dL (ref 30.0–36.0)
MCV: 96.7 fL (ref 80.0–100.0)
Platelets: 171 10*3/uL (ref 150–400)
RBC: 4.55 MIL/uL (ref 3.87–5.11)
RDW: 13.1 % (ref 11.5–15.5)
WBC: 6.6 10*3/uL (ref 4.0–10.5)
nRBC: 0 % (ref 0.0–0.2)

## 2023-06-11 LAB — T4, FREE: Free T4: 0.57 ng/dL — ABNORMAL LOW (ref 0.61–1.12)

## 2023-06-11 NOTE — Progress Notes (Unsigned)
ADVANCED HF CLINIC CONSULT NOTE  Referring Physician: Glori Luis, MD Primary Care: Glori Luis, MD Primary Cardiologist: Debbe Odea, MD  Chief Complaint: Shortness of breath  HPI:  Debbie Delacruz is a 78 y.o. female with a hx of CAD/CABG x4 in 2016 (LIMA to LAD, SVG -OM,PDA, PL), hypertension, hyperlipidemia, iron deficiency anemia, right renal mass, CKD 3b.  She was recently diagnosed with a right renal mass, evaluated by oncology, suspicious for underlying malignancy.  Resection is being considered.  Long h/o HTN. Had first MI at age 5 y/o. Had cath with stents. Developed recurrent angina in 2016. Cath with severe CAD. Underwent CABG x 4 with Dr. Cornelius Moras 2016   Saw Dr. Graciela Husbands in 2018 for recurrent syncope and felt to have dysautonomia   Saw Dr. Myriam Forehand in 1/22 for pre-op clearance for right RCC. Myoview 2/22 EF 70% no ischemia  Had full right nephrectomy.   Echo 2/22 EF 50-55% moderate RV dysfunction. Mild to moderate MR Pulmonary pressures normal  Works FT for an Pensions consultant. Main issue is severe fatigue and DOE. Says she can't even walk to the mailbox before stopping. Also having pain in R hip. No edema, orthopnea or PND. No chest pain or pressure. + snoring. Has severe OSA but was unable to tolerate CPAP (has not been tested again in some time). Says she wakes herself up at night. Falls asleep instantly on sitting down.    Prior notes Left heart cath 05/2016 patent LIMA to LAD, patent SVG to OM, patent SVG to PDA, occluded SVG to PL. Echo 05/2016, EF 55 to 60%, basal inferior hypokinesis, impaired relaxation.   Past Medical History:  Diagnosis Date   A-fib (HCC)    Anemia    Anginal pain (HCC)    Anxiety    Arthritis    CAD (coronary artery disease)    Cancer (HCC)    Carpal tunnel syndrome    bilateral   Cataract    bil removed cataracts   Coronary artery disease    2x stents, Dr. Juliann Pares   Depression    Dysrhythmia    Family history of adverse  reaction to anesthesia    "brother; S/P lipotripsy in [place]; transferred to [facility]; had to put him on life support for 2 days"   GERD (gastroesophageal reflux disease)    Headache    "weekly" (09/13/2014)   History of blood transfusion 05/2014   "we haven't figured out why I needed it yet" (09/13/2014)   History of hiatal hernia    Hyperglycemia    Hyperlipidemia    Hypertension    Kidney stone    Myocardial infarct (HCC)    Myocardial infarct, old 1999   15 years ago   Obesity    PONV (postoperative nausea and vomiting)    Renal mass    S/P CABG x 4 09/18/2014   LIMA to LAD, SVG to PDA-dRCA sequentially, SVG to OM, EVH via right thigh    Sleep apnea    Sleep apnea    "don't use a mask" (09/13/2014)   Unstable angina (HCC)     Current Outpatient Medications  Medication Sig Dispense Refill   acetaminophen (TYLENOL) 500 MG tablet Take 500 mg by mouth every 6 (six) hours as needed for headache.     amLODipine (NORVASC) 5 MG tablet Take 1 tablet by mouth once daily 90 tablet 1   aspirin EC 81 MG tablet Take 1 tablet (81 mg total) by mouth daily.  Blood Pressure Monitoring (BLOOD PRESSURE KIT) DEVI Check blood pressure daily, brand at patient and insurance preference, dx code I10 1 each 0   Docusate Sodium (DSS) 100 MG CAPS Take 1 capsule by mouth daily at 6 (six) AM.     Evolocumab (REPATHA SURECLICK) 140 MG/ML SOAJ Inject 140 mg into the skin every 14 (fourteen) days. 2 mL 2   ezetimibe (ZETIA) 10 MG tablet Take 1 tablet by mouth once daily 90 tablet 0   gentamicin ointment (GARAMYCIN) 0.1 % Apply 1 Application topically as needed (to nose as needed).     isosorbide mononitrate (IMDUR) 30 MG 24 hr tablet Take 1 tablet by mouth once daily 90 tablet 1   losartan (COZAAR) 100 MG tablet Take 100 mg by mouth daily.     metoprolol succinate (TOPROL-XL) 50 MG 24 hr tablet TAKE 1 TABLET BY MOUTH ONCE DAILY. MUST KEEP SCHEDULED  APPOINTMENT REQUIRED FOR FUTURE REFILLS 45 tablet  0   nitroGLYCERIN (NITROSTAT) 0.4 MG SL tablet Place 1 tablet (0.4 mg total) under the tongue every 5 (five) minutes as needed for chest pain. Take for up to 3 doses and then call 911 if still having chest pain. 50 tablet 3   pantoprazole (PROTONIX) 40 MG tablet Take 1 tablet by mouth once daily 90 tablet 0   rosuvastatin (CRESTOR) 40 MG tablet Take 1 tablet (40 mg total) by mouth daily. 90 tablet 3   Sodium Sulfate-Mag Sulfate-KCl (SUTAB) [phone] MG TABS Take 1 kit by mouth as directed. Patient to bring in coupon 24 tablet 0   No current facility-administered medications for this visit.    Allergies  Allergen Reactions   Ferrous Fumarate Other (See Comments)    STOMACH ISSUES STOMACH ISSUES STOMACH ISSUES   Lipitor [Atorvastatin] Other (See Comments)    myalgia      Social History   Socioeconomic History   Marital status: Married    Spouse name: Not on file   Number of children: Not on file   Years of education: Not on file   Highest education level: Not on file  Occupational History   Not on file  Tobacco Use   Smoking status: Never    Passive exposure: Never   Smokeless tobacco: Never  Vaping Use   Vaping status: Never Used  Substance and Sexual Activity   Alcohol use: Not Currently    Alcohol/week: 0.0 standard drinks of alcohol   Drug use: No   Sexual activity: Not Currently    Birth control/protection: Post-menopausal  Other Topics Concern   Not on file  Social History Narrative   ** Merged History Encounter **       Lives in [place] with 10 YO nephew and husband. 3 dogs outside.  Work - Therapist, sports, [employer]  Diet - regular, limited meat  Exercise - walks some   Social Drivers of Corporate investment banker Strain: Low Risk  (03/26/2023)   Overall Financial Resource Strain (CARDIA)    Difficulty of Paying Living Expenses: Not hard at all  Food Insecurity: No Food Insecurity (04/30/2023)   Hunger Vital Sign    Worried About Running  Out of Food in the Last Year: Never true    Ran Out of Food in the Last Year: Never true  Transportation Needs: No Transportation Needs (04/30/2023)   PRAPARE - Administrator, Civil Service (Medical): No    Lack of Transportation (Non-Medical): No  Physical Activity: Insufficiently Active (03/26/2023)  Exercise Vital Sign    Days of Exercise per Week: 2 days    Minutes of Exercise per Session: 30 min  Stress: No Stress Concern Present (03/26/2023)   [employer] of Occupational Health - Occupational Stress Questionnaire    Feeling of Stress : Not at all  Social Connections: Socially Integrated (03/26/2023)   Social Connection and Isolation Panel [NHANES]    Frequency of Communication with Friends and Family: More than three times a week    Frequency of Social Gatherings with Friends and Family: Never    Attends Religious Services: More than 4 times per year    Active Member of [employer] or Organizations: Yes    Attends Banker Meetings: Never    Marital Status: Married  Catering manager Violence: Not At Risk (04/30/2023)   Humiliation, Afraid, Rape, and Kick questionnaire    Fear of Current or Ex-Partner: No    Emotionally Abused: No    Physically Abused: No    Sexually Abused: No      Family History  Problem Relation Age of Onset   Coronary artery disease Mother    Osteoporosis Mother    Heart disease Mother    Stroke Mother    Heart attack Mother    Hypertension Mother    Hypertension Father    Coronary artery disease Father    COPD Father    Heart disease Father    Heart attack Father    Glaucoma Father    Osteoporosis Father    Emphysema Father    Cancer Sister 31       colon   Hypertension Sister    Colon cancer Sister        dx in her 55's   Breast cancer Sister    Colon polyps Sister    Stroke Brother    Hypertension Brother    Colon polyps Brother    Stroke Maternal Grandfather    Stroke Maternal Grandmother    Osteoporosis  Maternal Grandmother    Hypertension Maternal Grandmother    Hypertension Paternal Grandmother    Hypertension Paternal Grandfather    Breast cancer Maternal Aunt        2 aunts-50's   Esophageal cancer Neg Hx    Stomach cancer Neg Hx    Rectal cancer Neg Hx    Pancreatic cancer Neg Hx     Vitals:   06/11/23 1354  BP: (!) 160/86  Pulse: 74  SpO2: 100%  Weight: 187 lb 2 oz (84.9 kg)    PHYSICAL EXAM: General:  Well appearing. No respiratory difficulty HEENT: normal Neck: supple. no JVD. Carotids 2+ bilat; no bruits. No lymphadenopathy or thryomegaly appreciated. Cor: PMI nondisplaced. Regular rate & rhythm. No rubs, gallops or murmurs. Lungs: clear Abdomen: soft, nontender, nondistended. No hepatosplenomegaly. No bruits or masses. Good bowel sounds. Extremities: no cyanosis, clubbing, rash, edema Neuro: alert & oriented x 3, cranial nerves grossly intact. moves all 4 extremities w/o difficulty. Affect pleasant.  ECG:   ASSESSMENT & PLAN:   Arvilla Meres, MD  2:25 PM

## 2023-06-11 NOTE — Patient Instructions (Addendum)
Great to see you today!!!  No changes were made, please continue current medications  Labs done today, your results will be available in MyChart, we will contact you for abnormal readings.  Your provider has recommended that  you wear a Zio Patch for 7 days.  This monitor will record your heart rhythm for our review.  IF you have any symptoms while wearing the monitor please press the button.  If you have any issues with the patch or you notice a red or orange light on it please call the company at [phone].  Once you remove the patch please mail it back to the company as soon as possible so we can get the results.  Your provider has recommended that you have a home sleep study (Itamar Test).  We have provided you with the equipment in our office today. Please go ahead and download the app. DO NOT OPEN OR TAMPER WITH THE BOX UNTIL WE ADVISE YOU TO DO SO. Once insurance has approved the test our office will call you with PIN number and approval to proceed with testing. Once you have completed the test you just dispose of the equipment, the information is automatically uploaded to [place] via blue-tooth technology. If your test is positive for sleep apnea and you need a home CPAP machine you will be contacted by Dr Norris Cross office [facility]) to set this up.  Your physician has requested that you have an echocardiogram. Echocardiography is a painless test that uses sound waves to create images of your heart. It provides your doctor with information about the size and shape of your heart and how well your heart's chambers and valves are working. This procedure takes approximately one hour. There are no restrictions for this procedure. Please do NOT wear cologne, perfume, aftershave, or lotions (deodorant is allowed). Please arrive 15 minutes prior to your appointment time.  Please note: We ask at that you not bring children with you during ultrasound (echo/ vascular) testing. Due to room size and  safety concerns, children are not allowed in the ultrasound rooms during exams. Our front office staff cannot provide observation of children in our lobby area while testing is being conducted. An adult accompanying a patient to their appointment will only be allowed in the ultrasound room at the discretion of the ultrasound technician under special circumstances. We apologize for any inconvenience.   Your physician recommends that you schedule a follow-up appointment in: 1 month   Please discuss with your Primary Care MD about starting [facility]    If you have any questions or concerns before your next appointment please send [place] a message through mychart or call our office at [phone] Monday-Friday 8 am-5 pm.   If you have an urgent need after hours on the weekend please call your Primary Cardiologist or the [facility] in [place] at [phone].   At the Advanced [facility] Clinic, you and your health needs are our priority. We have a designated team specialized in the treatment of Heart Failure. This Care Team includes your primary Heart Failure Specialized Cardiologist (physician), Advanced Practice Providers (APPs- Physician Assistants and Nurse Practitioners), and Pharmacist who all work together to provide you with the care you need, when you need it.   You may see any of the following providers on your designated Care Team at your next follow up:  Dr. Arvilla Meres Dr. Marca Ancona Dr. Dorthula Nettles Dr. Theresia Bough Tonye Becket, NP Robbie Lis, [place] Valrie Hart  [place], [place] Brynda Peon, NP [place] Lee, NP Clarisa Kindred, NP Enos Fling, PharmD

## 2023-06-11 NOTE — Progress Notes (Unsigned)
Height: 5'2"    Weight: 187 lb BMI: 34.23  Today's Date: 06/11/23  STOP BANG RISK ASSESSMENT S (snore) Have you been told that you snore?     YES   T (tired) Are you often tired, fatigued, or sleepy during the day?   YES  O (obstruction) Do you stop breathing, choke, or gasp during sleep? NO   P (pressure) Do you have or are you being treated for high blood pressure? YES   B (BMI) Is your body index greater than 35 kg/m? NO   A (age) Are you 76 years old or older? YES   N (neck) Do you have a neck circumference greater than 16 inches?      G (gender) Are you a female? NO   TOTAL STOP/BANG "YES" ANSWERS 4                                                                       For Office Use Only              Procedure Order Form    YES to 3+ Stop Bang questions OR two clinical symptoms - patient qualifies for WatchPAT (CPT 95800)      Clinical Notes: Will consult Sleep Specialist and refer for management of therapy due to patient increased risk of Sleep Apnea. Ordering a sleep study due to the following two clinical symptoms: Excessive daytime sleepiness G47.10 / History of high blood pressure R03.0

## 2023-06-13 LAB — T3, FREE: T3, Free: 3 pg/mL (ref 2.0–4.4)

## 2023-06-15 ENCOUNTER — Encounter: Payer: Self-pay | Admitting: Family Medicine

## 2023-06-15 ENCOUNTER — Encounter: Payer: Self-pay | Admitting: Internal Medicine

## 2023-06-15 DIAGNOSIS — G4733 Obstructive sleep apnea (adult) (pediatric): Secondary | ICD-10-CM

## 2023-06-15 DIAGNOSIS — E66811 Obesity, class 1: Secondary | ICD-10-CM

## 2023-06-15 DIAGNOSIS — I2510 Atherosclerotic heart disease of native coronary artery without angina pectoris: Secondary | ICD-10-CM

## 2023-06-16 MED ORDER — TIRZEPATIDE-WEIGHT MANAGEMENT 2.5 MG/0.5ML ~~LOC~~ SOLN: 2.5000 mg | SUBCUTANEOUS | 0 refills | Status: AC

## 2023-06-16 MED ORDER — TIRZEPATIDE-WEIGHT MANAGEMENT 5 MG/0.5ML ~~LOC~~ SOLN: 5.0000 mg | SUBCUTANEOUS | 1 refills | Status: DC

## 2023-06-20 ENCOUNTER — Encounter (INDEPENDENT_AMBULATORY_CARE_PROVIDER_SITE_OTHER): Payer: HMO | Admitting: Cardiology

## 2023-06-20 DIAGNOSIS — G4710 Hypersomnia, unspecified: Secondary | ICD-10-CM

## 2023-06-20 DIAGNOSIS — G4719 Other hypersomnia: Secondary | ICD-10-CM

## 2023-06-21 ENCOUNTER — Encounter: Payer: Self-pay | Admitting: Family Medicine

## 2023-06-21 NOTE — Telephone Encounter (Signed)
Please see pt message about Repatha.  Thanks

## 2023-06-22 ENCOUNTER — Encounter: Payer: Self-pay | Admitting: Oncology

## 2023-06-22 ENCOUNTER — Other Ambulatory Visit (HOSPITAL_COMMUNITY): Payer: Self-pay

## 2023-06-22 ENCOUNTER — Telehealth: Payer: Self-pay

## 2023-06-22 DIAGNOSIS — I493 Ventricular premature depolarization: Secondary | ICD-10-CM | POA: Diagnosis not present

## 2023-06-22 NOTE — Telephone Encounter (Signed)
PA request has been Submitted. New Encounter created for follow up. For additional info see Pharmacy Prior Auth telephone encounter from 06/22/2023.

## 2023-06-22 NOTE — Telephone Encounter (Signed)
Noted

## 2023-06-22 NOTE — Telephone Encounter (Signed)
Pharmacy Patient Advocate Encounter   Received notification from Patient Advice Request messages that prior authorization for Repatha SureClick 140MG /ML auto-injectors is required/requested.   Insurance verification completed.   The patient is insured through [facility] ADVANTAGE/RX ADVANCE .   Per test claim: PA required; PA submitted to above mentioned insurance via CoverMyMeds Key/confirmation #/EOC [id number] Status is pending

## 2023-06-23 ENCOUNTER — Other Ambulatory Visit (HOSPITAL_COMMUNITY): Payer: Self-pay

## 2023-06-23 ENCOUNTER — Telehealth: Payer: Self-pay | Admitting: Family Medicine

## 2023-06-23 NOTE — Telephone Encounter (Signed)
Left message to call and reschedule TOC, provider sick. Next TOC starts in June. If patient needs to be seen sooner, for a issue please schedule a office visit with next available provider.

## 2023-06-25 ENCOUNTER — Encounter: Payer: Medicare Other | Admitting: Family Medicine

## 2023-06-25 ENCOUNTER — Telehealth: Payer: Self-pay | Admitting: Internal Medicine

## 2023-06-25 ENCOUNTER — Ambulatory Visit: Payer: Medicare Other | Admitting: Family Medicine

## 2023-06-28 ENCOUNTER — Other Ambulatory Visit (HOSPITAL_COMMUNITY): Payer: Self-pay

## 2023-06-28 ENCOUNTER — Encounter: Payer: Self-pay | Admitting: Internal Medicine

## 2023-06-28 ENCOUNTER — Telehealth: Payer: Self-pay

## 2023-06-28 NOTE — Telephone Encounter (Signed)
Attempted to call patient to inform her that a prior authorization has been submitted for [facility] and is pending.

## 2023-06-28 NOTE — Telephone Encounter (Signed)
Patient Advocate Encounter  Test claim for Greggory Keen returns a copay of $47 for 28 days, along with the message "Claim approved under Transition fill. Continued use requires authorization"   Prior auth submitted on 06/28/2023. CMM Key [id number]  Burnell Blanks, CPhT Rx Patient Advocate Phone: [phone]

## 2023-06-29 ENCOUNTER — Other Ambulatory Visit (HOSPITAL_COMMUNITY): Payer: Self-pay

## 2023-06-29 NOTE — Telephone Encounter (Signed)
 Prior authorization for Mounjaro  was denied because the insurance does not consider OSA an appropriate diagnosis and does not cover anti-obesity agents whatsoever. Neither Ozempic, [id number], nor Zepbound  are covered . Attempted to call patient to inform her, with no reply. HIPAA compliant voicemail left with instructions to call the clinic back.

## 2023-07-01 ENCOUNTER — Ambulatory Visit: Payer: HMO | Attending: Internal Medicine

## 2023-07-01 DIAGNOSIS — I10 Essential (primary) hypertension: Secondary | ICD-10-CM

## 2023-07-01 DIAGNOSIS — G4719 Other hypersomnia: Secondary | ICD-10-CM

## 2023-07-01 NOTE — Telephone Encounter (Signed)
 Pharmacy Patient Advocate Encounter  Received notification from [facility] ADVANTAGE/RX ADVANCE that Prior Authorization for Repatha  SureClick 140MG /ML auto-injectors has been APPROVED from 06/22/2023 to 12/20/2023   PA #/Case ID/Reference #: [id number]

## 2023-07-03 ENCOUNTER — Other Ambulatory Visit: Payer: Self-pay | Admitting: Family Medicine

## 2023-07-03 DIAGNOSIS — E785 Hyperlipidemia, unspecified: Secondary | ICD-10-CM

## 2023-07-05 ENCOUNTER — Ambulatory Visit (HOSPITAL_BASED_OUTPATIENT_CLINIC_OR_DEPARTMENT_OTHER)
Admission: RE | Admit: 2023-07-05 | Discharge: 2023-07-05 | Disposition: A | Discharge status: Home / Self Care | Payer: HMO | Source: Ambulatory Visit | Attending: Internal Medicine | Admitting: Internal Medicine

## 2023-07-05 ENCOUNTER — Other Ambulatory Visit: Payer: Self-pay

## 2023-07-05 ENCOUNTER — Emergency Department
Admission: EM | Admit: 2023-07-05 | Discharge: 2023-07-05 | Disposition: A | Discharge status: Home / Self Care | Payer: HMO | Attending: Emergency Medicine | Admitting: Emergency Medicine

## 2023-07-05 ENCOUNTER — Emergency Department: Payer: HMO

## 2023-07-05 DIAGNOSIS — I081 Rheumatic disorders of both mitral and tricuspid valves: Secondary | ICD-10-CM | POA: Diagnosis not present

## 2023-07-05 DIAGNOSIS — I4891 Unspecified atrial fibrillation: Secondary | ICD-10-CM | POA: Diagnosis not present

## 2023-07-05 DIAGNOSIS — J069 Acute upper respiratory infection, unspecified: Secondary | ICD-10-CM | POA: Insufficient documentation

## 2023-07-05 DIAGNOSIS — I2510 Atherosclerotic heart disease of native coronary artery without angina pectoris: Secondary | ICD-10-CM | POA: Insufficient documentation

## 2023-07-05 DIAGNOSIS — I129 Hypertensive chronic kidney disease with stage 1 through stage 4 chronic kidney disease, or unspecified chronic kidney disease: Secondary | ICD-10-CM | POA: Insufficient documentation

## 2023-07-05 DIAGNOSIS — I10 Essential (primary) hypertension: Secondary | ICD-10-CM

## 2023-07-05 DIAGNOSIS — Z951 Presence of aortocoronary bypass graft: Secondary | ICD-10-CM | POA: Insufficient documentation

## 2023-07-05 DIAGNOSIS — R000 Tachycardia, unspecified: Secondary | ICD-10-CM | POA: Diagnosis present

## 2023-07-05 DIAGNOSIS — Z20822 Contact with and (suspected) exposure to covid-19: Secondary | ICD-10-CM | POA: Diagnosis not present

## 2023-07-05 DIAGNOSIS — I493 Ventricular premature depolarization: Secondary | ICD-10-CM

## 2023-07-05 DIAGNOSIS — N189 Chronic kidney disease, unspecified: Secondary | ICD-10-CM | POA: Diagnosis not present

## 2023-07-05 DIAGNOSIS — I4892 Unspecified atrial flutter: Secondary | ICD-10-CM | POA: Insufficient documentation

## 2023-07-05 DIAGNOSIS — R0600 Dyspnea, unspecified: Secondary | ICD-10-CM | POA: Insufficient documentation

## 2023-07-05 DIAGNOSIS — R0609 Other forms of dyspnea: Secondary | ICD-10-CM

## 2023-07-05 DIAGNOSIS — N183 Chronic kidney disease, stage 3 unspecified: Secondary | ICD-10-CM | POA: Insufficient documentation

## 2023-07-05 LAB — TROPONIN I (HIGH SENSITIVITY)
Troponin I (High Sensitivity): 9 ng/L (ref <18)
Troponin I (High Sensitivity): 14 ng/L (ref <18)

## 2023-07-05 LAB — BASIC METABOLIC PANEL
Anion gap: 14 (ref 5–15)
BUN: 29 mg/dL — ABNORMAL HIGH (ref 8–23)
CO2: 21 mmol/L — ABNORMAL LOW (ref 22–32)
Calcium: 9.1 mg/dL (ref 8.9–10.3)
Chloride: 107 mmol/L (ref 98–111)
Creatinine, Ser: 1.47 mg/dL — ABNORMAL HIGH (ref 0.44–1.00)
GFR, Estimated: 37 mL/min — ABNORMAL LOW (ref >60)
Glucose, Bld: 105 mg/dL — ABNORMAL HIGH (ref 70–99)
Potassium: 4.5 mmol/L (ref 3.5–5.1)
Sodium: 142 mmol/L (ref 135–145)

## 2023-07-05 LAB — ECHOCARDIOGRAM COMPLETE
AR max vel: 2.27 cm2
AV Area VTI: 2.06 cm2
AV Area mean vel: 2.33 cm2
AV Mean grad: 3 mm[Hg]
AV Peak grad: 4.7 mm[Hg]
Ao pk vel: 1.08 m/s
S' Lateral: 3.24 cm

## 2023-07-05 LAB — RESP PANEL BY RT-PCR (RSV, FLU A&B, COVID)  RVPGX2
Influenza A by PCR: NEGATIVE
Influenza B by PCR: NEGATIVE
Resp Syncytial Virus by PCR: NEGATIVE
SARS Coronavirus 2 by RT PCR: NEGATIVE

## 2023-07-05 LAB — CBC
HCT: 46.6 % — ABNORMAL HIGH (ref 36.0–46.0)
Hemoglobin: 15.2 g/dL — ABNORMAL HIGH (ref 12.0–15.0)
MCH: 31.3 pg (ref 26.0–34.0)
MCHC: 32.6 g/dL (ref 30.0–36.0)
MCV: 95.9 fL (ref 80.0–100.0)
Platelets: 155 10*3/uL (ref 150–400)
RBC: 4.86 MIL/uL (ref 3.87–5.11)
RDW: 13.2 % (ref 11.5–15.5)
WBC: 5.9 10*3/uL (ref 4.0–10.5)
nRBC: 0 % (ref 0.0–0.2)

## 2023-07-05 LAB — MAGNESIUM: Magnesium: 2.3 mg/dL (ref 1.7–2.4)

## 2023-07-05 LAB — T4, FREE: Free T4: 0.78 ng/dL (ref 0.61–1.12)

## 2023-07-05 LAB — TSH: TSH: 2.63 u[IU]/mL (ref 0.350–4.500)

## 2023-07-05 MED ORDER — AMIODARONE HCL 200 MG PO TABS: ORAL_TABLET | ORAL | 0 refills | Status: DC

## 2023-07-05 MED ORDER — DILTIAZEM HCL 25 MG/5ML IV SOLN
15.0000 mg | Freq: Once | INTRAVENOUS | Status: DC
  Filled 2023-07-05: qty 5

## 2023-07-05 MED ORDER — METOPROLOL SUCCINATE ER 50 MG PO TB24
50.0000 mg | ORAL_TABLET | ORAL | Status: AC
  Administered 2023-07-05: 50 mg via ORAL
  Filled 2023-07-05: qty 1

## 2023-07-05 MED ORDER — SODIUM CHLORIDE 0.9 % IV BOLUS
500.0000 mL | Freq: Once | INTRAVENOUS | Status: AC
  Administered 2023-07-05: 500 mL via INTRAVENOUS

## 2023-07-05 NOTE — Progress Notes (Signed)
*  PRELIMINARY RESULTS* Echocardiogram 2D Echocardiogram has been performed. Heart rate was 150 bpm. Dr. Julane Ny and Dr. Nolan Battle were notified. Patient sent to the ER  Debbie Delacruz 07/05/2023, 9:21 AM

## 2023-07-05 NOTE — ED Triage Notes (Signed)
 Pt brought over via Cardiology; was having Echo done w/ HR in 150's.  Reports recently wearing Holter Monitor x 7 days; scheduled to get those results this Friday.  Patient tearful w/ triagek, other vitals WDL.

## 2023-07-05 NOTE — ED Provider Notes (Signed)
 [facility] Provider Note    Event Date/Time   First MD Initiated Contact with Patient 07/05/23 [phone]     (approximate)   History   Chief Complaint: Tachycardia   HPI  Debbie Delacruz is a 78 y.o. female with a history of hypertension, CAD, GERD, CABG, CKD who was in cardiology clinic this morning have an echocardiogram done when she was noted to have a heart rate in the 150s.  She is brought to the ED for further evaluation.  Denies any chest pain or shortness of breath or other acute symptoms currently.  Does report that she has been having dyspnea on exertion for many weeks without any recent worsening.  Denies exertional chest pain/pressure.  No dizziness.  She reports this was a standard echocardiogram, not a stress echo with pharmacologic chronotropes as far she knows.  Reviewed outside records noting patient just completed 8-day Holter monitor, summary notes periods of bradycardia as well as periods of NSVT.  Also had multiple episodes of atrial fibrillation.         Physical Exam   Triage Vital Signs: ED Triage Vitals  Encounter Vitals Group     BP 07/05/23 0928 (!) 154/103     Systolic BP Percentile --      Diastolic BP Percentile --      Pulse --      Resp 07/05/23 0928 17     Temp 07/05/23 0928 98.3 F (36.8 C)     Temp Source 07/05/23 0928 Oral     SpO2 07/05/23 0928 99 %     Weight 07/05/23 0924 187 lb (84.8 kg)     Height 07/05/23 0924 5\' 2"  (1.575 m)     Head Circumference --      Peak Flow --      Pain Score 07/05/23 0924 0     Pain Loc --      Pain Education --      Exclude from Growth Chart --     Most recent vital signs: Vitals:   07/05/23 1030 07/05/23 1340  BP: (!) 175/91 (!) 157/56  Pulse: 81 82  Resp: 20 19  Temp:    SpO2: 100% 97%    General: Awake, no distress.  CV:  Good peripheral perfusion.  Irregular rhythm, tachycardia heart rate 150-160 Resp:  Normal effort.  Clear to auscultation  bilaterally Abd:  No distention.  Soft nontender Other:  No lower extremity edema   ED Results / Procedures / Treatments   Labs (all labs ordered are listed, but only abnormal results are displayed) Labs Reviewed  BASIC METABOLIC PANEL - Abnormal; Notable for the following components:      Result Value   CO2 21 (*)    Glucose, Bld 105 (*)    BUN 29 (*)    Creatinine, Ser 1.47 (*)    GFR, Estimated 37 (*)    All other components within normal limits  CBC - Abnormal; Notable for the following components:   Hemoglobin 15.2 (*)    HCT 46.6 (*)    All other components within normal limits  RESP PANEL BY RT-PCR (RSV, FLU A&B, COVID)  [id number]  MAGNESIUM   T4, FREE  TSH  TROPONIN I (HIGH SENSITIVITY)  TROPONIN I (HIGH SENSITIVITY)     EKG Interpreted by me Atrial fibrillation, rate 147.  Normal axis, normal intervals.  Poor R wave progression.  Normal ST segments.  Normal T waves, no acute ischemic changes.  3 PVCs  on the strip including 1 couplet.   RADIOLOGY Chest x-ray interpreted by me, appears unremarkable.  Radiology report reviewed   PROCEDURES:  Procedures   MEDICATIONS ORDERED IN ED: Medications  sodium chloride  0.9 % bolus 500 mL (0 mLs Intravenous Stopped 07/05/23 1038)  metoprolol  succinate (TOPROL -XL) 24 hr tablet 50 mg (50 mg Oral Given 07/05/23 1000)     IMPRESSION / MDM / ASSESSMENT AND PLAN / ED COURSE  I reviewed the triage vital signs and the nursing notes.  DDx: Non-STEMI, hyperthyroidism, electrolyte derangement, AKI, COVID, influenza, atrial fibrillation  Patient's presentation is most consistent with acute presentation with potential threat to life or bodily function.  Patient presents with A-fib/RVR.  Currently blood pressure is unremarkable, asymptomatic.  Recently had abnormal Holter monitor.  Will check labs, give IV fluids, IV diltiazem  with goal of rate control.  Will reach out to cardiology for recommendations given recent evaluation and  Holter monitor completion.   Clinical Course as of 07/05/23 1354  Mon Jul 05, 2023  1015 Case d/w cardiology Dr. Nolan Battle - will eval and provide recs [PS]    Clinical Course User Index [PS] Jacquie Maudlin, MD    ----------------------------------------- 1:54 PM on 07/05/2023 ----------------------------------------- Discussed with cardiology who recommends initiating amiodarone  as well as Eliquis .  Patient has leftover Eliquis  from her husband supply which is not expired, she will start taking that along with amiodarone  oral load regimen.   FINAL CLINICAL IMPRESSION(S) / ED DIAGNOSES   Final diagnoses:  Atrial fibrillation with RVR (HCC)     Rx / DC Orders   ED Discharge Orders          Ordered    amiodarone  (PACERONE ) 200 MG tablet  Multiple Frequencies        07/05/23 1353             Note:  This document was prepared using Dragon voice recognition software and may include unintentional dictation errors.   Jacquie Maudlin, MD 07/05/23 1355

## 2023-07-05 NOTE — Consult Note (Signed)
 Cardiology Consultation   Patient ID: Debbie Delacruz MRN: [id number]; DOB: 05-May-1946  Admit date: 07/05/2023 Date of Consult: 07/05/2023  PCP:  Kent Pear, MD   [facility] Providers Cardiologist:  Constancia Delton, MD        Patient Profile:   Debbie Delacruz is a 78 y.o. female with a hx of atrial fibrillaiton, anemia, anxiety, arthritis, CAD s/p CABG x4 2016, depression, GERD, hyperlipidemia, hyperglycemia, hypertension, obesity, and renal mass who is being seen 07/05/2023 for the evaluation of atrial fibrillation with RVR at the request of Dr. Vicenta Graft.  History of Present Illness:   Debbie Delacruz has a long history of hypertension. She had her first MI at age 30, underwent LHC with stenting. Developed recurrent angina 2016 and underwent CABG x4 with Dr. Alva Jewels. She was seen by Dr. Rodolfo Clan in 2018 for recurrent syncope and was thought to have dysautonomia. She follows with Dr. Junnie Olives and was most recently seen for pre-operative clearance 05/2020. At that time she was doing well with no complaints. She was taking her medications as prescribed and no changes were made. She was seen by Dr. Bensimhon 06/11/2023 for the evaluation of dyspnea on exertion and fatigue. He suspected these symptoms were due to untreated OSA. She did have long term monitor which showed underlying sinus rhythm with short runs of SVT 5-7 beats, 1% afib burden.   Patient was undergoing routine echocardiogram today 2/10 with rate noted to be 150s bpm. She was largely asymptomatic but encouraged to go to the emergency department for further evaluation. She endorses dyspnea on exertion, fatigue, and orthopnea ongoing for several weeks. She also tells me she has been sick with cough and fever for the past week and was started on azithromycin  yesterday by her PCP. She denies chest pain, palpitations, nausea, vomiting, lightheadedness, dizziness, and diaphoresis. In the ED, BP 154/103, HR 147 bpm,  RR 17, T 98.10F, SpO2 99%. BMP and CBC largely unremarkable. Troponin negative. EKG shows atrial fibrillation with rate 147 bpm. Potassium, magnesium , TSH, and T4 within normal limits. Patient given metoprolol  succinate 50 mg.   Past Medical History:  Diagnosis Date   A-fib (HCC)    Anemia    Anginal pain (HCC)    Anxiety    Arthritis    CAD (coronary artery disease)    Cancer (HCC)    Carpal tunnel syndrome    bilateral   Cataract    bil removed cataracts   Coronary artery disease    2x stents, Dr. Beau Bound   Depression    Dysrhythmia    Family history of adverse reaction to anesthesia    "brother; S/P lipotripsy in [place]; transferred to [facility]; had to put him on life support for 2 days"   GERD (gastroesophageal reflux disease)    Headache    "weekly" (09/13/2014)   History of blood transfusion 05/2014   "we haven't figured out why I needed it yet" (09/13/2014)   History of hiatal hernia    Hyperglycemia    Hyperlipidemia    Hypertension    Kidney stone    Myocardial infarct (HCC)    Myocardial infarct, old 1999   15 years ago   Obesity    PONV (postoperative nausea and vomiting)    Renal mass    S/P CABG x 4 09/18/2014   LIMA to LAD, SVG to PDA-dRCA sequentially, SVG to OM, EVH via right thigh    Sleep apnea    Sleep apnea    "  don't use a mask" (09/13/2014)   Unstable angina [facility])     Past Surgical History:  Procedure Laterality Date   ABDOMINAL HYSTERECTOMY     APPENDECTOMY     BACK SURGERY     BLADDER SURGERY     CARDIAC CATHETERIZATION  06/2014   CARDIAC CATHETERIZATION N/A 06/16/2016   Procedure: Left Heart Cath and Cors/Grafts Angiography;  Surgeon: Odie Benne, MD;  Location: [facility] INVASIVE CV LAB;  Service: Cardiovascular;  Laterality: N/A;   CARPAL TUNNEL RELEASE Left    CATARACT EXTRACTION W/ INTRAOCULAR LENS  IMPLANT, BILATERAL     CHOLECYSTECTOMY     COLONOSCOPY  2022   MS-MAC-prep good-tics/multiple frag of TA-1 yr recall    CORONARY ANGIOPLASTY WITH STENT PLACEMENT  1999   [facility] x2 stent   CORONARY ARTERY BYPASS GRAFT N/A 09/18/2014   Procedure: CORONARY ARTERY BYPASS GRAFTING (CABG)times four using LIMA  to LAD:SVG to  PD and DIST RCA;SVG to OM;, EVH Right thigh;  Surgeon: Gardenia Jump, MD;  Location: [facility] OR;  Service: Open Heart Surgery;  Laterality: N/A;   CYSTOSCOPY W/ RETROGRADES Left 03/15/2020   Procedure: CYSTOSCOPY WITH RETROGRADE PYELOGRAM;  Surgeon: Lawerence Pressman, MD;  Location: [facility] ORS;  Service: Urology;  Laterality: Left;   CYSTOSCOPY WITH STENT PLACEMENT Left 02/20/2020   Procedure: CYSTOSCOPY WITH STENT PLACEMENT;  Surgeon: Lawerence Pressman, MD;  Location: [facility] ORS;  Service: Urology;  Laterality: Left;   CYSTOSCOPY/URETEROSCOPY/HOLMIUM LASER/STENT PLACEMENT Left 03/15/2020   Procedure: CYSTOSCOPY/URETEROSCOPY;  Surgeon: Lawerence Pressman, MD;  Location: [facility] ORS;  Service: Urology;  Laterality: Left;   ESOPHAGOGASTRODUODENOSCOPY N/A 09/17/2014   Procedure: ESOPHAGOGASTRODUODENOSCOPY (EGD);  Surgeon: Claudette Cue, MD;  Location: [facility] ENDOSCOPY;  Service: Endoscopy;  Laterality: N/A;   heart stents     1999   INCONTINENCE SURGERY     INGUINAL HERNIA REPAIR     INGUINAL HERNIA REPAIR Right X 3   "last one was in the 1990's; still have hernia there now" (09/13/2014)   LAPAROSCOPIC CHOLECYSTECTOMY     LUMBAR DISC SURGERY  1990's?   LUMBAR SPINE SURGERY     NEPHRECTOMY Right    removal of ovaries     TEE WITHOUT CARDIOVERSION N/A 09/18/2014   Procedure: TRANSESOPHAGEAL ECHOCARDIOGRAM (TEE);  Surgeon: Gardenia Jump, MD;  Location: [facility] OR;  Service: Open Heart Surgery;  Laterality: N/A;   UPPER GASTROINTESTINAL ENDOSCOPY         Inpatient Medications: Scheduled Meds:  Continuous Infusions:  PRN Meds:   Allergies:    Allergies  Allergen Reactions   Ferrous Fumarate Other (See Comments)    STOMACH ISSUES STOMACH ISSUES STOMACH ISSUES   Lipitor  [Atorvastatin ] Other (See Comments)     Myalgia    Social History:   Social History   Socioeconomic History   Marital status: Married    Spouse name: Not on file   Number of children: Not on file   Years of education: Not on file   Highest education level: Not on file  Occupational History   Not on file  Tobacco Use   Smoking status: Never    Passive exposure: Never   Smokeless tobacco: Never  Vaping Use   Vaping status: Never Used  Substance and Sexual Activity   Alcohol use: Not Currently    Alcohol/week: 0.0 standard drinks of alcohol   Drug use: No   Sexual activity: Not Currently    Birth control/protection: Post-menopausal  Other Topics Concern   Not on  file  Social History Narrative   ** Merged History Encounter **       Lives in [place] with 10 YO nephew and husband. 3 dogs outside.  Work - Therapist, sports, [employer]  Diet - regular, limited meat  Exercise - walks some   Social Drivers of Corporate investment banker Strain: Low Risk  (03/26/2023)   Overall Financial Resource Strain (CARDIA)    Difficulty of Paying Living Expenses: Not hard at all  Food Insecurity: No Food Insecurity (04/30/2023)   Hunger Vital Sign    Worried About Running Out of Food in the Last Year: Never true    Ran Out of Food in the Last Year: Never true  Transportation Needs: No Transportation Needs (04/30/2023)   PRAPARE - Administrator, Civil Service (Medical): No    Lack of Transportation (Non-Medical): No  Physical Activity: Insufficiently Active (03/26/2023)   Exercise Vital Sign    Days of Exercise per Week: 2 days    Minutes of Exercise per Session: 30 min  Stress: No Stress Concern Present (03/26/2023)   [employer] of Occupational Health - Occupational Stress Questionnaire    Feeling of Stress : Not at all  Social Connections: Socially Integrated (03/26/2023)   Social Connection and Isolation Panel [NHANES]    Frequency of Communication with Friends and Family: More than three  times a week    Frequency of Social Gatherings with Friends and Family: Never    Attends Religious Services: More than 4 times per year    Active Member of [employer] or Organizations: Yes    Attends Banker Meetings: Never    Marital Status: Married  Catering manager Violence: Not At Risk (04/30/2023)   Humiliation, Afraid, Rape, and Kick questionnaire    Fear of Current or Ex-Partner: No    Emotionally Abused: No    Physically Abused: No    Sexually Abused: No    Family History:    Family History  Problem Relation Age of Onset   Coronary artery disease Mother    Osteoporosis Mother    Heart disease Mother    Stroke Mother    Heart attack Mother    Hypertension Mother    Hypertension Father    Coronary artery disease Father    COPD Father    Heart disease Father    Heart attack Father    Glaucoma Father    Osteoporosis Father    Emphysema Father    Cancer Sister 79       colon   Hypertension Sister    Colon cancer Sister        dx in her 83's   Breast cancer Sister    Colon polyps Sister    Stroke Brother    Hypertension Brother    Colon polyps Brother    Stroke Maternal Grandfather    Stroke Maternal Grandmother    Osteoporosis Maternal Grandmother    Hypertension Maternal Grandmother    Hypertension Paternal Grandmother    Hypertension Paternal Grandfather    Breast cancer Maternal Aunt        2 aunts-50's   Esophageal cancer Neg Hx    Stomach cancer Neg Hx    Rectal cancer Neg Hx    Pancreatic cancer Neg Hx      ROS:  Please see the history of present illness.   Physical Exam/Data:   Vitals:   07/05/23 0928 07/05/23 0930 07/05/23 1000 07/05/23 1030  BP: (!) 154/103 Aaron Aas)  174/99 (!) 174/99 (!) 175/91  Pulse:  93 92 81  Resp: 17 (!) 22 (!) 24 20  Temp: 98.3 F (36.8 C)     TempSrc: Oral     SpO2: 99% 97% 97% 100%  Weight:      Height:       No intake or output data in the 24 hours ending 07/05/23 1120    07/05/2023    9:24 AM 06/11/2023     1:54 PM 03/05/2023    8:41 AM  Last 3 Weights  Weight (lbs) 187 lb 187 lb 2 oz 181 lb  Weight (kg) 84.823 kg 84.879 kg 82.101 kg     Body mass index is 34.2 kg/m.  General:  Well nourished, well developed, in no acute distress HEENT: normal Neck: no JVD Cardiac:  irregular rhythm, normal S1, S2; no murmur  Lungs:  clear to auscultation bilaterally, no wheezing, rhonchi or rales  Abd: soft, nontender, no hepatomegaly  Ext: no edema Skin: warm and dry  Psych:  Normal affect   EKG:  The EKG was personally reviewed and demonstrates:  atrial flutter rate 147 bpm Telemetry:  Telemetry was personally reviewed and demonstrates: atrial flutter with RVR, conversion to sinus rhythm at 0950 today 2/10  Relevant CV Studies:  07/05/2023 TTE 1. Left ventricular systolic function may be underestimated due to  tachycardia. Left ventricular ejection fraction, by estimation, is 45 to  50%. The left ventricle has mildly decreased function. Left ventricular  endocardial border not optimally defined  to evaluate regional wall motion. There is mild left ventricular  hypertrophy. Left ventricular diastolic parameters are indeterminate.   2. Right ventricular systolic function is normal. The right ventricular  size is normal. Mildly increased right ventricular wall thickness.   3. Left atrial size was mildly dilated.   4. The mitral valve is normal in structure. Mild mitral valve  regurgitation.   5. Tricuspid valve regurgitation is moderate.   6. The aortic valve is tricuspid. Aortic valve regurgitation is not  visualized. No aortic stenosis is present.   7. The inferior vena cava is normal in size with greater than 50%  respiratory variability, suggesting right atrial pressure of 3 mmHg.   8. Rhythm strip during this exam demonstrates atrial flutter.   06/11/2023 Long term monitor Patch Wear Time:  8 days and 21 hours (2025-01-17T15:10:50-0500 to 2025-01-26T12:16:37-0500) Patient had a min HR  of 35 bpm, max HR of 190 bpm, and avg HR of 69 bpm. Predominant underlying rhythm was Sinus Rhythm. 243 Supraventricular Tachycardia runs occurred, the run with the fastest interval lasting 5 beats with a max rate of 190 bpm, the  longest lasting 7 beats with an avg rate of 131 bpm. Atrial Fibrillation/Flutter occurred (1% burden), ranging from 77-153 bpm (avg of 117 bpm), the longest lasting 49 mins 58 secs with an avg rate of 111 bpm. Isolated SVEs were occasional (2.8%, 18911),  SVE Couplets were occasional (1.5%, 5079), and SVE Triplets were rare (<1.0%, 801). Isolated VEs were frequent (14.5%, 97033), VE Couplets were occasional (2.1%, 6907), and VE Triplets were rare (<1.0%, 866). Ventricular Bigeminy and Trigeminy were  present.   06/26/2020 Myoview  Lexiscan  No T wave inversion was noted during stress. There was no ST segment deviation noted during stress. There is a fixed small defect of mild severity present in the apical anterior location which appears artifactual This is a low risk study. The left ventricular ejection fraction is hyperdynamic (70%). There is no evidence for ischemia  06/25/2020 TTE 1. Left ventricular ejection fraction, by estimation, is 50 to 55%. The  left ventricle has low normal function. The left ventricle has no regional  wall motion abnormalities. There is mild left ventricular hypertrophy.  Left ventricular diastolic  parameters are consistent with Grade I diastolic dysfunction (impaired  relaxation).   2. Right ventricular systolic function is moderately reduced. The right  ventricular size is normal. Mildly increased right ventricular wall  thickness. There is normal pulmonary artery systolic pressure.   3. Left atrial size was mildly dilated.   4. The mitral valve is normal in structure. Mild to moderate mitral valve  regurgitation. No evidence of mitral stenosis.   5. Tricuspid valve regurgitation is moderate to severe.   6. The aortic valve is  tricuspid. Aortic valve regurgitation is not  visualized. No aortic stenosis is present.   7. The inferior vena cava is normal in size with greater than 50%  respiratory variability, suggesting right atrial pressure of 3 mmHg.   06/16/2016 LHC Post Atrio lesion, 100 %stenosed. SVG graft was visualized by angiography and is normal in caliber. Dist Graft lesion, 100 %stenosed. Mid RCA to Dist RCA lesion, 60 %stenosed. Prox RCA lesion, 60 %stenosed. Prox Cx to Mid Cx lesion, 99 %stenosed. SVG graft was visualized by angiography and is normal in caliber and anatomically normal. Prox LAD lesion, 100 %stenosed. LIMA graft was visualized by angiography and is normal in caliber. Ost LM to LM lesion, 30 %stenosed. Prox RCA to Mid RCA lesion, 70 %stenosed. 1. Severe triple vessel CAD s/p 4V CABG with 3/4 patent bypass grafts.  2. The LAD is chronically occluded in the mid section. The mid and distal LAD fills from the patent LIMA graft.  3. There is high grade stenosis in the mid Circumflex supplying the obtuse marginal branch. The patent vein graft supplies the OM branch.  4. The mid to distal RCA has high grade stenosis leading into the PDA branch. The sequential vein graft supplies the PDA but the limb to the Posterolateral branch is now occluded. I suspect the graft to her Posterolateral branch may be the culprit for her presentation but PCI is not an option.   Laboratory Data:  High Sensitivity Troponin:   Recent Labs  Lab 07/05/23 0941  TROPONINIHS 9     Chemistry Recent Labs  Lab 07/05/23 0941  NA 142  K 4.5  CL 107  CO2 21*  GLUCOSE 105*  BUN 29*  CREATININE 1.47*  CALCIUM  9.1  MG 2.3  GFRNONAA 37*  ANIONGAP 14    No results for input(s): "PROT", "ALBUMIN ", "AST", "ALT", "ALKPHOS", "BILITOT" in the last 168 hours. Lipids No results for input(s): "CHOL", "TRIG", "HDL", "LABVLDL", "LDLCALC", "CHOLHDL" in the last 168 hours.  Hematology Recent Labs  Lab 07/05/23 0941   WBC 5.9  RBC 4.86  HGB 15.2*  HCT 46.6*  MCV 95.9  MCH 31.3  MCHC 32.6  RDW 13.2  PLT 155   Thyroid   Recent Labs  Lab 07/05/23 0941  TSH 2.630  FREET4 0.78    BNPNo results for input(s): "BNP", "PROBNP" in the last 168 hours.  DDimer No results for input(s): "DDIMER" in the last 168 hours.   Radiology/Studies:  DG Chest Portable 1 View Result Date: 07/05/2023 CLINICAL DATA:  Tachycardia. EXAM: PORTABLE CHEST 1 VIEW COMPARISON:  Chest radiograph dated February 21, 2020. CT chest dated April 01, 2020. FINDINGS: The heart size and mediastinal contours are within normal limits. Prior median sternotomy and  CABG. Aortic atherosclerosis. No focal consolidation, pleural effusion, or pneumothorax. No acute osseous abnormality. IMPRESSION: No acute cardiopulmonary findings. Electronically Signed   By: Mannie Seek M.D.   On: 07/05/2023 11:06     Assessment and Plan:   Atrial fibrillation with RVR - Patient chart reviewed, prior history of atrial fibrillation during past hospitalization 2021. Does not appear to be treated for afib.  - Presented 2/10 for elevated heart rate of 150s seen on routine echocardiogram. Patient was largely asymptomatic.  - Recent long term monitor showing low afib burden ~ 1% - CHA2DS2VASc at least 5 - K 4.5, mag 2.3 - TSH and T4 within normal limits - Given PTA metoprolol  succinate 50 mg - Telemetry shows atrial flutter with RVR and conversion to sinus rhythm ~ 0950 today 2/10 - Will start Eliquis  5 mg BID for stroke prophylaxis - Will start PO amiodarone  400 mg BID for one week, followed by 200 mg BID for one week, followed by 200 mg daily thereafter  Dyspnea on exertion Fatigue - Ongoing DOE, fatigue and orthopnea for several weeks, AHF clinic following as outpatient - Echo 2/10 showed LVEF 45-50%, however may be underestimated due to tachycardia, mild LVH, mildly dilated left atrial size, mild mitral regurgitation, moderate tricuspid  regurgitation - Appears euvolemic on exam and does not appear to be in acute HF - Patient is scheduled to see AHF clinic this Friday 2/14 for further outpatient evaluation of symptoms  CAD s/p CABG - No anginal symptoms - Troponin negative  - EKG without ischemic changes - Echo 2/10 results above - Consider stopping ASA with addition of DOAC - Continue Zetia  10 mg, Imdur  30 mg, losartan 100 mg, metoprolol  succinate 50 mg, and rosuvastatin  40 mg daily  Upper respiratory infection - Cough and congestion for the past week - Respiratory panel negative - Started on azithromycin  by PCP yesterday  CKD IIIb - Cr 1.47, appears near baseline  Hypertension - Elevated since arrival, continue PTA medications  Hyperlipidemia - Most recent lipid panel 01/2023 with LDL 93 - Continue Zetia  and rosuvastatin  as above  For questions or updates, please contact [facility] Please consult [email or web address] for contact info under    Signed, Brodie Cannon, PA-C  07/05/2023 11:20 AM

## 2023-07-05 NOTE — Discharge Instructions (Addendum)
 Take Eliquis  5mg  two times per day until you see cardiology.  Start taking Amiodarone  as follows: -- 400mg  by mouth two times per day for 1 week, then -- 200mg  by mouth two times per day for 1 week, then -- 200mg  by mouth once per day thereafter.

## 2023-07-07 ENCOUNTER — Telehealth: Payer: Self-pay

## 2023-07-07 DIAGNOSIS — I2510 Atherosclerotic heart disease of native coronary artery without angina pectoris: Secondary | ICD-10-CM

## 2023-07-07 DIAGNOSIS — I493 Ventricular premature depolarization: Secondary | ICD-10-CM

## 2023-07-07 DIAGNOSIS — G4719 Other hypersomnia: Secondary | ICD-10-CM

## 2023-07-07 DIAGNOSIS — I10 Essential (primary) hypertension: Secondary | ICD-10-CM

## 2023-07-07 DIAGNOSIS — R0609 Other forms of dyspnea: Secondary | ICD-10-CM

## 2023-07-07 NOTE — Telephone Encounter (Signed)
-----   Message from Armanda Magic sent at 07/01/2023  6:21 PM EST ----- Normal home sleep study so in lab PSG will be ordered

## 2023-07-07 NOTE — Telephone Encounter (Signed)
Notified patent of sleep study results and recommendations. All questions answered and patient verbalized understanding. PSG Study ordered today.

## 2023-07-08 ENCOUNTER — Telehealth: Payer: Self-pay | Admitting: Internal Medicine

## 2023-07-08 NOTE — Telephone Encounter (Signed)
Pt confirmed appt for 07/09/23

## 2023-07-09 ENCOUNTER — Other Ambulatory Visit
Admission: RE | Admit: 2023-07-09 | Discharge: 2023-07-09 | Disposition: A | Discharge status: Home / Self Care | Payer: HMO | Source: Ambulatory Visit | Attending: Internal Medicine | Admitting: Internal Medicine

## 2023-07-09 ENCOUNTER — Ambulatory Visit: Payer: HMO | Admitting: Internal Medicine

## 2023-07-09 ENCOUNTER — Inpatient Hospital Stay: Payer: HMO | Attending: Oncology

## 2023-07-09 VITALS — BP 152/84 | HR 70 | Wt 189.2 lb

## 2023-07-09 DIAGNOSIS — I252 Old myocardial infarction: Secondary | ICD-10-CM | POA: Insufficient documentation

## 2023-07-09 DIAGNOSIS — R5383 Other fatigue: Secondary | ICD-10-CM | POA: Insufficient documentation

## 2023-07-09 DIAGNOSIS — I2510 Atherosclerotic heart disease of native coronary artery without angina pectoris: Secondary | ICD-10-CM

## 2023-07-09 DIAGNOSIS — Z7901 Long term (current) use of anticoagulants: Secondary | ICD-10-CM | POA: Insufficient documentation

## 2023-07-09 DIAGNOSIS — R0683 Snoring: Secondary | ICD-10-CM | POA: Diagnosis not present

## 2023-07-09 DIAGNOSIS — I480 Paroxysmal atrial fibrillation: Secondary | ICD-10-CM | POA: Diagnosis not present

## 2023-07-09 DIAGNOSIS — I493 Ventricular premature depolarization: Secondary | ICD-10-CM

## 2023-07-09 DIAGNOSIS — E785 Hyperlipidemia, unspecified: Secondary | ICD-10-CM | POA: Insufficient documentation

## 2023-07-09 DIAGNOSIS — Z79899 Other long term (current) drug therapy: Secondary | ICD-10-CM | POA: Insufficient documentation

## 2023-07-09 DIAGNOSIS — I129 Hypertensive chronic kidney disease with stage 1 through stage 4 chronic kidney disease, or unspecified chronic kidney disease: Secondary | ICD-10-CM | POA: Diagnosis not present

## 2023-07-09 DIAGNOSIS — D509 Iron deficiency anemia, unspecified: Secondary | ICD-10-CM | POA: Insufficient documentation

## 2023-07-09 DIAGNOSIS — N1832 Chronic kidney disease, stage 3b: Secondary | ICD-10-CM | POA: Insufficient documentation

## 2023-07-09 DIAGNOSIS — Z905 Acquired absence of kidney: Secondary | ICD-10-CM | POA: Diagnosis not present

## 2023-07-09 DIAGNOSIS — Z6834 Body mass index (BMI) 34.0-34.9, adult: Secondary | ICD-10-CM | POA: Diagnosis not present

## 2023-07-09 DIAGNOSIS — I10 Essential (primary) hypertension: Secondary | ICD-10-CM

## 2023-07-09 DIAGNOSIS — I4891 Unspecified atrial fibrillation: Secondary | ICD-10-CM

## 2023-07-09 DIAGNOSIS — Z951 Presence of aortocoronary bypass graft: Secondary | ICD-10-CM | POA: Insufficient documentation

## 2023-07-09 DIAGNOSIS — I471 Supraventricular tachycardia, unspecified: Secondary | ICD-10-CM | POA: Diagnosis not present

## 2023-07-09 DIAGNOSIS — E118 Type 2 diabetes mellitus with unspecified complications: Secondary | ICD-10-CM

## 2023-07-09 DIAGNOSIS — I25119 Atherosclerotic heart disease of native coronary artery with unspecified angina pectoris: Secondary | ICD-10-CM | POA: Diagnosis not present

## 2023-07-09 LAB — CBC WITH DIFFERENTIAL/PLATELET
Abs Immature Granulocytes: 0.04 10*3/uL (ref 0.00–0.07)
Basophils Absolute: 0 10*3/uL (ref 0.0–0.1)
Basophils Relative: 0 %
Eosinophils Absolute: 0.1 10*3/uL (ref 0.0–0.5)
Eosinophils Relative: 1 %
HCT: 41.6 % (ref 36.0–46.0)
Hemoglobin: 13.7 g/dL (ref 12.0–15.0)
Immature Granulocytes: 1 %
Lymphocytes Relative: 17 %
Lymphs Abs: 1.5 10*3/uL (ref 0.7–4.0)
MCH: 31.1 pg (ref 26.0–34.0)
MCHC: 32.9 g/dL (ref 30.0–36.0)
MCV: 94.5 fL (ref 80.0–100.0)
Monocytes Absolute: 1.4 10*3/uL — ABNORMAL HIGH (ref 0.1–1.0)
Monocytes Relative: 16 %
Neutro Abs: 5.6 10*3/uL (ref 1.7–7.7)
Neutrophils Relative %: 65 %
Platelets: 149 10*3/uL — ABNORMAL LOW (ref 150–400)
RBC: 4.4 MIL/uL (ref 3.87–5.11)
RDW: 13 % (ref 11.5–15.5)
WBC: 8.6 10*3/uL (ref 4.0–10.5)
nRBC: 0 % (ref 0.0–0.2)

## 2023-07-09 LAB — IRON AND TIBC
Iron: 40 ug/dL (ref 28–170)
Saturation Ratios: 11 % (ref 10.4–31.8)
TIBC: 382 ug/dL (ref 250–450)
UIBC: 342 ug/dL

## 2023-07-09 LAB — BASIC METABOLIC PANEL
Anion gap: 10 (ref 5–15)
BUN: 31 mg/dL — ABNORMAL HIGH (ref 8–23)
CO2: 22 mmol/L (ref 22–32)
Calcium: 9 mg/dL (ref 8.9–10.3)
Chloride: 106 mmol/L (ref 98–111)
Creatinine, Ser: 1.64 mg/dL — ABNORMAL HIGH (ref 0.44–1.00)
GFR, Estimated: 32 mL/min — ABNORMAL LOW (ref >60)
Glucose, Bld: 114 mg/dL — ABNORMAL HIGH (ref 70–99)
Potassium: 4.3 mmol/L (ref 3.5–5.1)
Sodium: 138 mmol/L (ref 135–145)

## 2023-07-09 LAB — HEMOGLOBIN A1C
Hgb A1c MFr Bld: 6.1 % — ABNORMAL HIGH (ref 4.8–5.6)
Mean Plasma Glucose: 128.37 mg/dL

## 2023-07-09 LAB — FERRITIN: Ferritin: 30 ng/mL (ref 11–307)

## 2023-07-09 MED ORDER — AMLODIPINE BESYLATE 10 MG PO TABS: 10.0000 mg | ORAL_TABLET | Freq: Every day | ORAL | 6 refills | Status: DC

## 2023-07-09 NOTE — Patient Instructions (Signed)
Great to see you today!!!  Medication Changes:  INCREASE Amlodipine to 10 mg Daily  Lab Work:  Labs done today, your results will be available in MyChart, we will contact you for abnormal readings.   Testing/Procedures:  Your physician has requested that you have a cardiac MRI. Cardiac MRI uses a computer to create images of your heart as its beating, producing both still and moving pictures of your heart and major blood vessels. For further information please visit [email or web address]. Please follow the instruction sheet given to you today for more information. SEE INSTRUCTIONS BELOW, YOU WILL BE CALLED TO SCHEDULE THIS  Special Instructions // Education:    You are scheduled for Cardiac MRI at the location below.  Please arrive for your appointment at ______________ . ?  [facility] [address] [place], [place] [address] Please go to the [facility] and check-in with the desk attendant.   Magnetic resonance imaging (MRI) is a painless test that produces images of the inside of the body without using Xrays.  During an MRI, strong magnets and radio waves work together in a Data processing manager to form detailed images.   MRI images may provide more details about a medical condition than X-rays, CT scans, and ultrasounds can provide.  You may be given earphones to listen for instructions.  You may eat a light breakfast and take medications as ordered with the exception of furosemide, hydrochlorothiazide, chlorthalidone or spironolactone (or any other fluid pill). If you are undergoing a stress MRI, please avoid stimulants for 12 hr prior to test. (I.e. Caffeine, nicotine, chocolate, or antihistamine medications)  An IV will be inserted into one of your veins. Contrast material will be injected into your IV. It will leave your body through your urine within a day. You may be told to drink plenty of fluids to help flush the contrast material out of your  system.  You will be asked to remove all metal, including: Watch, jewelry, and other metal objects including hearing aids, hair pieces and dentures. Also wearable glucose monitoring systems (ie. Freestyle Libre and Omnipods) (Braces and fillings normally are not a problem.)   TEST WILL TAKE APPROXIMATELY 1 HOUR  PLEASE NOTIFY SCHEDULING AT LEAST 24 HOURS IN ADVANCE IF YOU ARE UNABLE TO KEEP YOUR APPOINTMENT. [phone]  For more information and frequently asked questions, please visit our website : [email or web address]  Please call the Cardiac Imaging Nurse Navigators with any questions/concerns. [phone] Office    Follow-Up in: 1 MONTH    If you have any questions or concerns before your next appointment please send [place] a message through [place] or call our office at [phone] Monday-Friday 8 am-5 pm.   If you have an urgent need after hours on the weekend please call your Primary Cardiologist or the [facility] in [place] at [phone].   At the [facility], you and your health needs are our priority. We have a designated team specialized in the treatment of Heart Failure. This Care Team includes your primary Heart Failure Specialized Cardiologist (physician), Advanced Practice Providers (APPs- Physician Assistants and Nurse Practitioners), and Pharmacist who all work together to provide you with the care you need, when you need it.   You may see any of the following providers on your designated Care Team at your next follow up:  Dr. Arvilla Meres Dr. Marca Ancona Dr. Dorthula Nettles Dr. Theresia Bough Tonye Becket, NP Robbie Lis, [place] [facility] Anna Genre, [place] Aniceto Boss  Trisha Mangle, NP [place] Lee, NP Clarisa Kindred, NP Enos Fling, PharmD

## 2023-07-09 NOTE — Progress Notes (Signed)
[facility] CONSULT NOTE  Referring Physician: Glori Luis, MD Primary Care: Glori Luis, MD Primary Cardiologist: Debbe Odea, MD  Chief Complaint: Shortness of breath  HPI:  Debbie Delacruz is a 78 y.o. female with a hx of CAD/CABG x4 in 2016 (LIMA to LAD, SVG -OM,PDA, PL), hypertension, hyperlipidemia, iron deficiency anemia, right renal mass, CKD 3b.  She was recently diagnosed with a right renal mass, evaluated by oncology, suspicious for underlying malignancy.  Resection is being considered.  Long h/o HTN. Had first MI at age 28 y/o. Had cath with stents. Developed recurrent angina in 2016. Cath with severe CAD. Underwent CABG x 4 with Dr. Cornelius Moras 2016   Saw Dr. Graciela Husbands in 2018 for recurrent syncope and felt to have dysautonomia   Saw Dr. Myriam Forehand in 1/22 for pre-op clearance for right RCC. Myoview 2/22 EF 70% no ischemia  Had full right nephrectomy.   Echo 2/22 EF 50-55% moderate RV dysfunction. Mild to moderate MR Pulmonary pressures normal  Works FT for an Pensions consultant. Main issue is severe fatigue and DOE. Says she can't even walk to the mailbox before stopping. Also having pain in R hip. No edema, orthopnea or PND. No chest pain or pressure. + snoring. Has severe OSA but was unable to tolerate CPAP (has not been tested again in some time). Says she wakes herself up at night. Falls asleep instantly on sitting down.    I saw her for the first time 06/11/23 for evaluation of severe SOB and palpitation. We did echo and zio.   Zio 1/25: Sinus rhythm. 1% AF 243 runs SVT PVCs 14.5%   Went for echo 2/10 and found to be in AF with RVR at the time. Went to ER and broke spontaneously. Started on po amio.  Echo 45-50% in setting of AF with RVR RV ok Marked LAE  HST: 1/25 AHI 0.7  Moderate to severe snoring sats down to 92% -> sent for in lab study  Says she feels ok. "Not great" Denies palpitations. Woke up yesterday and and left shoulder sore. Unable to lift it  up. Remains SOB with mild activity and very fatigued. On amio and Eliquis. No bleeding  Prior notes Left heart cath 05/2016 patent LIMA to LAD, patent SVG to OM, patent SVG to PDA, occluded SVG to PL. Echo 05/2016, EF 55 to 60%, basal inferior hypokinesis, impaired relaxation.   Past Medical History:  Diagnosis Date   A-fib (HCC)    Anemia    Anginal pain (HCC)    Anxiety    Arthritis    CAD (coronary artery disease)    Cancer (HCC)    Carpal tunnel syndrome    bilateral   Cataract    bil removed cataracts   Coronary artery disease    2x stents, Dr. Juliann Pares   Depression    Dysrhythmia    Family history of adverse reaction to anesthesia    "brother; S/P lipotripsy in [place]; transferred to [facility]; had to put him on life support for 2 days"   GERD (gastroesophageal reflux disease)    Headache    "weekly" (09/13/2014)   History of blood transfusion 05/2014   "we haven't figured out why I needed it yet" (09/13/2014)   History of hiatal hernia    Hyperglycemia    Hyperlipidemia    Hypertension    Kidney stone    Myocardial infarct (HCC)    Myocardial infarct, old 1999   15 years ago  Obesity    PONV (postoperative nausea and vomiting)    Renal mass    S/P CABG x 4 09/18/2014   LIMA to LAD, SVG to PDA-dRCA sequentially, SVG to OM, EVH via right thigh    Sleep apnea    Sleep apnea    "don't use a mask" (09/13/2014)   Unstable angina (HCC)     Current Outpatient Medications  Medication Sig Dispense Refill   acetaminophen (TYLENOL) 500 MG tablet Take 500 mg by mouth every 6 (six) hours as needed for headache.     amiodarone (PACERONE) 200 MG tablet Take 2 tablets (400 mg total) by mouth 2 (two) times daily for 7 days, THEN 1 tablet (200 mg total) 2 (two) times daily for 7 days, THEN 1 tablet (200 mg total) daily. 88 tablet 0   amLODipine (NORVASC) 5 MG tablet Take 1 tablet by mouth once daily 90 tablet 1   apixaban (ELIQUIS) 5 MG TABS tablet Take 5 mg by mouth 2  (two) times daily.     aspirin EC 81 MG tablet Take 1 tablet (81 mg total) by mouth daily.     Blood Pressure Monitoring (BLOOD PRESSURE KIT) DEVI Check blood pressure daily, brand at patient and insurance preference, dx code I10 1 each 0   Docusate Sodium (DSS) 100 MG CAPS Take 1 capsule by mouth daily at 6 (six) AM.     Evolocumab (REPATHA SURECLICK) 140 MG/ML SOAJ Inject 140 mg into the skin every 14 (fourteen) days. 2 mL 2   ezetimibe (ZETIA) 10 MG tablet Take 1 tablet by mouth once daily 90 tablet 0   gentamicin ointment (GARAMYCIN) 0.1 % Apply 1 Application topically as needed (to nose as needed).     isosorbide mononitrate (IMDUR) 30 MG 24 hr tablet Take 1 tablet by mouth once daily 90 tablet 0   losartan (COZAAR) 100 MG tablet Take 100 mg by mouth daily.     metoprolol succinate (TOPROL-XL) 50 MG 24 hr tablet TAKE 1 TABLET BY MOUTH ONCE DAILY . APPOINTMENT REQUIRED FOR FUTURE REFILLS 45 tablet 0   nitroGLYCERIN (NITROSTAT) 0.4 MG SL tablet Place 1 tablet (0.4 mg total) under the tongue every 5 (five) minutes as needed for chest pain. Take for up to 3 doses and then call 911 if still having chest pain. 50 tablet 3   pantoprazole (PROTONIX) 40 MG tablet Take 1 tablet by mouth once daily 90 tablet 0   rosuvastatin (CRESTOR) 40 MG tablet Take 1 tablet (40 mg total) by mouth daily. 90 tablet 3   Sodium Sulfate-Mag Sulfate-KCl (SUTAB) [id number] MG TABS Take 1 kit by mouth as directed. Patient to bring in coupon 24 tablet 0   tirzepatide (ZEPBOUND) 2.5 MG/0.5ML injection vial Inject 2.5 mg into the skin once a week for 28 days. 2 mL 0   tirzepatide 5 MG/0.5ML injection vial Inject 5 mg into the skin once a week. Start after completing the 4 weeks of the 2.5 mg dose. 2 mL 1   No current facility-administered medications for this visit.    Allergies  Allergen Reactions   Ferrous Fumarate Other (See Comments)    STOMACH ISSUES STOMACH ISSUES STOMACH ISSUES   Lipitor [Atorvastatin] Other  (See Comments)    Myalgia      Social History   Socioeconomic History   Marital status: Married    Spouse name: Not on file   Number of children: Not on file   Years of education: Not on file  Highest education level: Not on file  Occupational History   Not on file  Tobacco Use   Smoking status: Never    Passive exposure: Never   Smokeless tobacco: Never  Vaping Use   Vaping status: Never Used  Substance and Sexual Activity   Alcohol use: Not Currently    Alcohol/week: 0.0 standard drinks of alcohol   Drug use: No   Sexual activity: Not Currently    Birth control/protection: Post-menopausal  Other Topics Concern   Not on file  Social History Narrative   ** Merged History Encounter **       Lives in [place] with 10 YO nephew and husband. 3 dogs outside.  Work - Therapist, sports, [employer]  Diet - regular, limited meat  Exercise - walks some   Social Drivers of Corporate investment banker Strain: Low Risk  (03/26/2023)   Overall Financial Resource Strain (CARDIA)    Difficulty of Paying Living Expenses: Not hard at all  Food Insecurity: No Food Insecurity (04/30/2023)   Hunger Vital Sign    Worried About Running Out of Food in the Last Year: Never true    Ran Out of Food in the Last Year: Never true  Transportation Needs: No Transportation Needs (04/30/2023)   PRAPARE - Administrator, Civil Service (Medical): No    Lack of Transportation (Non-Medical): No  Physical Activity: Insufficiently Active (03/26/2023)   Exercise Vital Sign    Days of Exercise per Week: 2 days    Minutes of Exercise per Session: 30 min  Stress: No Stress Concern Present (03/26/2023)   [employer] of Occupational Health - Occupational Stress Questionnaire    Feeling of Stress : Not at all  Social Connections: Socially Integrated (03/26/2023)   Social Connection and Isolation Panel [NHANES]    Frequency of Communication with Friends and Family: More than three  times a week    Frequency of Social Gatherings with Friends and Family: Never    Attends Religious Services: More than 4 times per year    Active Member of [employer] or Organizations: Yes    Attends Banker Meetings: Never    Marital Status: Married  Catering manager Violence: Not At Risk (04/30/2023)   Humiliation, Afraid, Rape, and Kick questionnaire    Fear of Current or Ex-Partner: No    Emotionally Abused: No    Physically Abused: No    Sexually Abused: No      Family History  Problem Relation Age of Onset   Coronary artery disease Mother    Osteoporosis Mother    Heart disease Mother    Stroke Mother    Heart attack Mother    Hypertension Mother    Hypertension Father    Coronary artery disease Father    COPD Father    Heart disease Father    Heart attack Father    Glaucoma Father    Osteoporosis Father    Emphysema Father    Cancer Sister 40       colon   Hypertension Sister    Colon cancer Sister        dx in her 82's   Breast cancer Sister    Colon polyps Sister    Stroke Brother    Hypertension Brother    Colon polyps Brother    Stroke Maternal Grandfather    Stroke Maternal Grandmother    Osteoporosis Maternal Grandmother    Hypertension Maternal Grandmother    Hypertension Paternal Grandmother  Hypertension Paternal Grandfather    Breast cancer Maternal Aunt        2 aunts-50's   Esophageal cancer Neg Hx    Stomach cancer Neg Hx    Rectal cancer Neg Hx    Pancreatic cancer Neg Hx     Vitals:   07/09/23 0857  BP: (!) 152/84  Pulse: 70  SpO2: 97%  Weight: 189 lb 4 oz (85.8 kg)    PHYSICAL EXAM: General:  Sitting in chair No resp difficulty HEENT: normal Neck: supple. no JVD. Carotids 2+ bilat; no bruits. No lymphadenopathy or thryomegaly appreciated. Cor: PMI nondisplaced. Regular rate & rhythm. No rubs, gallops or murmurs. Lungs: clear Abdomen: obese soft, nontender, nondistended. No hepatosplenomegaly. No bruits or masses.  Good bowel sounds. Extremities: no cyanosis, clubbing, rash, edema Neuro: alert & orientedx3, cranial nerves grossly intact. Unable to lift left arm over head due to pain Affect pleasant   ECG: Sinus no PVCs No ST-T wave abnormalities. Personally reviewed   ASSESSMENT & PLAN:  1. Profound fatigue/dyspnea on exertion - I continue to suspect the main driver here is severe untreated OSA now complicated by frequent PVCs - unable to tolerate CPAP in past but that was years ago - However HST 1/25 was negative. (Noted heavy snoring but AHI < 1.0) -> we have referred for in-lab study as suspicion for significant OSA remains very high  2. OSA, untreated - HST 2/25: AHI 0.7  Moderate to severe snoring sats down to 92% -> sent for in lab studyplan as above  3. RV dysfunction - suspect OSA/OHS. - Repeat echo 2/10 EF 45-50% in setting of AF with RVR. RV ok   4. Frequent PVCs/SVT - Zio 1/25: Sinus rhythm. 1% AF 243 runs SVT PVCs 14.5%  - now on amio for AF. PVCs suppressed  5. PAF - continue amio for now.  - continue Eliquis 5 bid. No bleeding  6. CAD s/p CABG - no current s/s angina - continue Eliquis/statin/zetia  7. CKD 3b  - baseline Scr 1.6 in setting of solitary kidney with h/o R nephrectomy due to RCC - check labs today - Continue losartan - Add SGLT2i soon  8. Obesity - Body mass index is 34.61 kg/m. - she has discussed Mounjaro with PCP  - says insurance won't pay for it  - recheck A1c  9. HTN - BP remains elevated increase amlodipine to 10  Arvilla Meres, MD  9:14 AM

## 2023-07-12 ENCOUNTER — Other Ambulatory Visit: Payer: Self-pay | Admitting: Family Medicine

## 2023-07-16 ENCOUNTER — Inpatient Hospital Stay: Payer: HMO | Admitting: Nurse Practitioner

## 2023-07-16 DIAGNOSIS — D509 Iron deficiency anemia, unspecified: Secondary | ICD-10-CM | POA: Diagnosis not present

## 2023-07-16 NOTE — Progress Notes (Signed)
 [facility]  [phone] Fax:(336) [phone]  Virtual Visit Progress Note  I connected with Debbie Delacruz on 07/16/23 at 10:04 AM EDT by video enabled telemedicine visit and verified that I am speaking with the correct person using two identifiers.   I discussed the limitations, risks, security and privacy concerns of performing an evaluation and management service by telemedicine and the availability of in-person appointments. I also discussed with the patient that there may be a patient responsible charge related to this service. The patient expressed understanding and agreed to proceed.   Other persons participating in the visit and their role in the encounter: none   Patient's location: home  Provider's location: clinic   ID: Emelee Rodocker OB: [birth year]  MR#: [id number]  AOZ#:[id number]  Patient Care Team: Glori Luis, MD as PCP - General (Family Medicine) Debbe Odea, MD as PCP - Cardiology (Cardiology) Shelia Media, MD (Internal Medicine) Purcell Nails, MD (Inactive) as Consulting Physician (Cardiothoracic Surgery) Othella Boyer, MD as Consulting Physician (Cardiology) Jeralyn Ruths, MD as Consulting Physician (Oncology)  CHIEF COMPLAINT: Iron deficiency anemia, right renal mass.  INTERVAL HISTORY: Ellana Kawa is a 78 y.o. female with history of iron deficiency anemia who agrees to evaluation via telemedicine for follow up of her iron deficiency anemia. She last received IV feraheme February 2024. Denies black or bloody stools. Is unaware of any blood loss. Denies any neurologic complaints. Denies recent fevers or illnesses. Denies any easy bleeding or bruising. No melena or hematochezia. No pica or restless leg. Reports good appetite and denies weight loss. Denies chest pain. Denies any nausea, vomiting, constipation, or diarrhea. Denies urinary complaints. Patient offers no further specific  complaints today.   She has continues to work and feels well.    REVIEW OF SYSTEMS:   Review of Systems  Constitutional:  Positive for malaise/fatigue. Negative for fever and weight loss.  Respiratory:  Negative for cough and shortness of breath.   Cardiovascular:  Negative for chest pain and leg swelling.  Gastrointestinal:  Negative for abdominal pain, blood in stool and melena.  Genitourinary:  Negative for hematuria.  Musculoskeletal:  Negative for joint pain.  Skin:  Negative for rash.  Neurological:  Negative for dizziness, sensory change, focal weakness, weakness and headaches.  Psychiatric/Behavioral:  Negative for depression. The patient is not nervous/anxious and does not have insomnia.   As per HPI. Otherwise, a complete review of systems is negative.  PAST MEDICAL HISTORY: Past Medical History:  Diagnosis Date   A-fib (HCC)    Anemia    Anginal pain (HCC)    Anxiety    Arthritis    CAD (coronary artery disease)    Cancer (HCC)    Carpal tunnel syndrome    bilateral   Cataract    bil removed cataracts   Coronary artery disease    2x stents, Dr. Juliann Pares   Depression    Dysrhythmia    Family history of adverse reaction to anesthesia    "brother; S/P lipotripsy in [place]; transferred to [facility]; had to put him on life support for 2 days"   GERD (gastroesophageal reflux disease)    Headache    "weekly" (09/13/2014)   History of blood transfusion 05/2014   "we haven't figured out why I needed it yet" (09/13/2014)   History of hiatal hernia    Hyperglycemia    Hyperlipidemia    Hypertension    Kidney stone  Myocardial infarct [facility])    Myocardial infarct, old 1999   15 years ago   Obesity    PONV (postoperative nausea and vomiting)    Renal mass    S/P CABG x 4 09/18/2014   LIMA to LAD, SVG to PDA-dRCA sequentially, SVG to OM, EVH via right thigh    Sleep apnea    Sleep apnea    "don't use a mask" (09/13/2014)   Unstable angina (HCC)      PAST SURGICAL HISTORY: Past Surgical History:  Procedure Laterality Date   ABDOMINAL HYSTERECTOMY     APPENDECTOMY     BACK SURGERY     BLADDER SURGERY     CARDIAC CATHETERIZATION  06/2014   CARDIAC CATHETERIZATION N/A 06/16/2016   Procedure: Left Heart Cath and Cors/Grafts Angiography;  Surgeon: Kathleene Hazel, MD;  Location: [facility] INVASIVE CV LAB;  Service: Cardiovascular;  Laterality: N/A;   CARPAL TUNNEL RELEASE Left    CATARACT EXTRACTION W/ INTRAOCULAR LENS  IMPLANT, BILATERAL     CHOLECYSTECTOMY     COLONOSCOPY  2022   MS-MAC-prep good-tics/multiple frag of TA-1 yr recall   CORONARY ANGIOPLASTY WITH STENT PLACEMENT  1999   [facility] x2 stent   CORONARY ARTERY BYPASS GRAFT N/A 09/18/2014   Procedure: CORONARY ARTERY BYPASS GRAFTING (CABG)times four using LIMA  to LAD:SVG to  PD and DIST RCA;SVG to OM;, EVH Right thigh;  Surgeon: Purcell Nails, MD;  Location: [facility] OR;  Service: Open Heart Surgery;  Laterality: N/A;   CYSTOSCOPY W/ RETROGRADES Left 03/15/2020   Procedure: CYSTOSCOPY WITH RETROGRADE PYELOGRAM;  Surgeon: Sondra Come, MD;  Location: [facility] ORS;  Service: Urology;  Laterality: Left;   CYSTOSCOPY WITH STENT PLACEMENT Left 02/20/2020   Procedure: CYSTOSCOPY WITH STENT PLACEMENT;  Surgeon: Sondra Come, MD;  Location: [facility] ORS;  Service: Urology;  Laterality: Left;   CYSTOSCOPY/URETEROSCOPY/HOLMIUM LASER/STENT PLACEMENT Left 03/15/2020   Procedure: CYSTOSCOPY/URETEROSCOPY;  Surgeon: Sondra Come, MD;  Location: [facility] ORS;  Service: Urology;  Laterality: Left;   ESOPHAGOGASTRODUODENOSCOPY N/A 09/17/2014   Procedure: ESOPHAGOGASTRODUODENOSCOPY (EGD);  Surgeon: Louis Meckel, MD;  Location: [facility] ENDOSCOPY;  Service: Endoscopy;  Laterality: N/A;   heart stents     1999   INCONTINENCE SURGERY     INGUINAL HERNIA REPAIR     INGUINAL HERNIA REPAIR Right X 3   "last one was in the 1990's; still have hernia there now" (09/13/2014)   LAPAROSCOPIC CHOLECYSTECTOMY      LUMBAR DISC SURGERY  1990's?   LUMBAR SPINE SURGERY     NEPHRECTOMY Right    removal of ovaries     TEE WITHOUT CARDIOVERSION N/A 09/18/2014   Procedure: TRANSESOPHAGEAL ECHOCARDIOGRAM (TEE);  Surgeon: Purcell Nails, MD;  Location: [facility] OR;  Service: Open Heart Surgery;  Laterality: N/A;   UPPER GASTROINTESTINAL ENDOSCOPY      FAMILY HISTORY: Family History  Problem Relation Age of Onset   Coronary artery disease Mother    Osteoporosis Mother    Heart disease Mother    Stroke Mother    Heart attack Mother    Hypertension Mother    Hypertension Father    Coronary artery disease Father    COPD Father    Heart disease Father    Heart attack Father    Glaucoma Father    Osteoporosis Father    Emphysema Father    Cancer Sister 20       colon   Hypertension Sister    Colon cancer Sister  dx in her 52's   Breast cancer Sister    Colon polyps Sister    Stroke Brother    Hypertension Brother    Colon polyps Brother    Stroke Maternal Grandfather    Stroke Maternal Grandmother    Osteoporosis Maternal Grandmother    Hypertension Maternal Grandmother    Hypertension Paternal Grandmother    Hypertension Paternal Grandfather    Breast cancer Maternal Aunt        2 aunts-50's   Esophageal cancer Neg Hx    Stomach cancer Neg Hx    Rectal cancer Neg Hx    Pancreatic cancer Neg Hx     ADVANCED DIRECTIVES (Y/N):  N  HEALTH MAINTENANCE: Social History   Tobacco Use   Smoking status: Never    Passive exposure: Never   Smokeless tobacco: Never  Vaping Use   Vaping status: Never Used  Substance Use Topics   Alcohol use: Not Currently    Alcohol/week: 0.0 standard drinks of alcohol   Drug use: No    Colonoscopy:  PAP:  Bone density:  Lipid panel:  Allergies  Allergen Reactions   Ferrous Fumarate Other (See Comments)    STOMACH ISSUES STOMACH ISSUES STOMACH ISSUES   Lipitor [Atorvastatin] Other (See Comments)    Myalgia    Current Outpatient  Medications  Medication Sig Dispense Refill   acetaminophen (TYLENOL) 500 MG tablet Take 500 mg by mouth every 6 (six) hours as needed for headache.     amiodarone (PACERONE) 200 MG tablet Take 2 tablets (400 mg total) by mouth 2 (two) times daily for 7 days, THEN 1 tablet (200 mg total) 2 (two) times daily for 7 days, THEN 1 tablet (200 mg total) daily. 88 tablet 0   amLODipine (NORVASC) 10 MG tablet Take 1 tablet (10 mg total) by mouth daily. 30 tablet 6   apixaban (ELIQUIS) 5 MG TABS tablet Take 5 mg by mouth 2 (two) times daily.     aspirin EC 81 MG tablet Take 1 tablet (81 mg total) by mouth daily.     Blood Pressure Monitoring (BLOOD PRESSURE KIT) DEVI Check blood pressure daily, brand at patient and insurance preference, dx code I10 1 each 0   Docusate Sodium (DSS) 100 MG CAPS Take 1 capsule by mouth daily at 6 (six) AM.     Evolocumab (REPATHA SURECLICK) 140 MG/ML SOAJ Inject 140 mg into the skin every 14 (fourteen) days. 2 mL 2   ezetimibe (ZETIA) 10 MG tablet Take 1 tablet by mouth once daily 90 tablet 0   gentamicin ointment (GARAMYCIN) 0.1 % Apply 1 Application topically as needed (to nose as needed).     isosorbide mononitrate (IMDUR) 30 MG 24 hr tablet Take 1 tablet by mouth once daily 90 tablet 0   losartan (COZAAR) 100 MG tablet Take 100 mg by mouth daily.     metoprolol succinate (TOPROL-XL) 50 MG 24 hr tablet TAKE 1 TABLET BY MOUTH ONCE DAILY . APPOINTMENT REQUIRED FOR FUTURE REFILLS 45 tablet 0   nitroGLYCERIN (NITROSTAT) 0.4 MG SL tablet Place 1 tablet (0.4 mg total) under the tongue every 5 (five) minutes as needed for chest pain. Take for up to 3 doses and then call 911 if still having chest pain. 50 tablet 3   pantoprazole (PROTONIX) 40 MG tablet Take 1 tablet by mouth once daily 90 tablet 0   rosuvastatin (CRESTOR) 40 MG tablet Take 1 tablet (40 mg total) by mouth daily. 90 tablet 3  Sodium Sulfate-Mag Sulfate-KCl (SUTAB) [phone] MG TABS Take 1 kit by mouth as  directed. Patient to bring in coupon 24 tablet 0   tirzepatide 5 MG/0.5ML injection vial Inject 5 mg into the skin once a week. Start after completing the 4 weeks of the 2.5 mg dose. 2 mL 1   No current facility-administered medications for this visit.    OBJECTIVE: There were no vitals filed for this visit. There is no height or weight on file to calculate BMI.     ECOG FS:0 - Asymptomatic  Physical Exam Constitutional:      Appearance: She is not ill-appearing.  Pulmonary:     Effort: Pulmonary effort is normal.  Skin:    Coloration: Skin is not pale.  Neurological:     Mental Status: She is alert and oriented to person, place, and time.  Psychiatric:        Mood and Affect: Mood normal.        Behavior: Behavior normal.    LAB RESULTS: Lab Results  Component Value Date   NA 138 07/09/2023   K 4.3 07/09/2023   CL 106 07/09/2023   CO2 22 07/09/2023   GLUCOSE 114 (H) 07/09/2023   BUN 31 (H) 07/09/2023   CREATININE 1.64 (H) 07/09/2023   CALCIUM 9.0 07/09/2023   PROT 7.3 06/11/2023   ALBUMIN 4.1 06/11/2023   AST 18 06/11/2023   ALT 16 06/11/2023   ALKPHOS 47 06/11/2023   BILITOT 0.7 06/11/2023   GFRNONAA 32 (L) 07/09/2023   GFRAA >60 02/24/2020   Lab Results  Component Value Date   WBC 8.6 07/09/2023   NEUTROABS 5.6 07/09/2023   HGB 13.7 07/09/2023   HCT 41.6 07/09/2023   MCV 94.5 07/09/2023   PLT 149 (L) 07/09/2023   Lab Results  Component Value Date   IRON 40 07/09/2023   TIBC 382 07/09/2023   IRONPCTSAT 11 07/09/2023   Lab Results  Component Value Date   FERRITIN 30 07/09/2023     STUDIES: LONG TERM MONITOR (3-14 DAYS) Result Date: 07/11/2023 Patch Wear Time:  8 days and 21 hours (2025-01-17T15:10:50-0500 to 2025-01-26T12:16:37-0500) 1. Sinus rhythm predominates - avg HR of 69 bpm. 2. Atrial Fibrillation/Flutter occurred (1% burden), ranging from 77-153 bpm (avg of 117 bpm), the longest lasting 49 mins 58 secs with an avg rate of 111 bpm. 3. 243  runs of Supraventricular Tachycardia occurred, the run with the fastest interval lasting 5 beats with a max rate of 190 bpm, the longest lasting 7 beats with an avg rate of 131 bpm. ' 4.  Isolated PACs were occasional (2.8%, 18911) 5.  Isolated PVCs were frequent (14.5%, 97033), VE Couplets were occasional (2.1%, 6907), and VE Triplets were rare (<1.0%, 866). - 3 morphologies (14.1% for mos prevalent morphology) 6. Ventricular Bigeminy and Trigeminy were present.   ECHOCARDIOGRAM COMPLETE Result Date: 07/05/2023    ECHOCARDIOGRAM REPORT   Patient Name:   MELISA DONOFRIO Date of Exam: 07/05/2023 Medical Rec #:  [id number]            Height:       62.0 in Accession #:    [id number]           Weight:       187.1 lb Date of Birth:  May 18, 1946            BSA:          1.858 m Patient Age:    78 years  BP:           154/99 mmHg Patient Gender: F                    HR:           150 bpm. Exam Location:  [facility] Procedure: 2D Echo, Cardiac Doppler and Color Doppler Indications:     Dyspnea R06.00  History:         Patient has prior history of Echocardiogram examinations, most                  recent 06/25/2020. CAD, Prior CABG, CKD III,                  Signs/Symptoms:Dyspnea; Risk Factors:Hypertension and                  Non-Smoker.  Sonographer:     Dondra Prader RVT RCS Referring Phys:  [id number] Bevelyn Buckles BENSIMHON Diagnosing Phys: Yvonne Kendall MD  Sonographer Comments: HR 150 bpm. Dr. Okey Dupre sent patient to the ER IMPRESSIONS  1. Left ventricular systolic function may be underestimated due to tachycardia. Left ventricular ejection fraction, by estimation, is 45 to 50%. The left ventricle has mildly decreased function. Left ventricular endocardial border not optimally defined to evaluate regional wall motion. There is mild left ventricular hypertrophy. Left ventricular diastolic parameters are indeterminate.  2. Right ventricular systolic function is normal. The right ventricular size is normal. Mildly increased  right ventricular wall thickness.  3. Left atrial size was mildly dilated.  4. The mitral valve is normal in structure. Mild mitral valve regurgitation.  5. Tricuspid valve regurgitation is moderate.  6. The aortic valve is tricuspid. Aortic valve regurgitation is not visualized. No aortic stenosis is present.  7. The inferior vena cava is normal in size with greater than 50% respiratory variability, suggesting right atrial pressure of 3 mmHg.  8. Rhythm strip during this exam demonstrates atrial flutter. FINDINGS  Left Ventricle: Left ventricular systolic function may be underestimated due to tachycardia. Left ventricular ejection fraction, by estimation, is 45 to 50%. The left ventricle has mildly decreased function. Left ventricular endocardial border not optimally defined to evaluate regional wall motion. The left ventricular internal cavity size was normal in size. There is mild left ventricular hypertrophy. Left ventricular diastolic parameters are indeterminate. Right Ventricle: The right ventricular size is normal. Mildly increased right ventricular wall thickness. Right ventricular systolic function is normal. Left Atrium: Left atrial size was mildly dilated. Right Atrium: Right atrial size was normal in size. Pericardium: There is no evidence of pericardial effusion. Mitral Valve: The mitral valve is normal in structure. Mild mitral annular calcification. Mild mitral valve regurgitation. Tricuspid Valve: The tricuspid valve is normal in structure. Tricuspid valve regurgitation is moderate. Aortic Valve: The aortic valve is tricuspid. Aortic valve regurgitation is not visualized. No aortic stenosis is present. Aortic valve mean gradient measures 3.0 mmHg. Aortic valve peak gradient measures 4.7 mmHg. Aortic valve area, by VTI measures 2.06 cm. Pulmonic Valve: The pulmonic valve was not well visualized. Pulmonic valve regurgitation is not visualized. No evidence of pulmonic stenosis. Aorta: The aortic root  is normal in size and structure. Venous: The inferior vena cava is normal in size with greater than 50% respiratory variability, suggesting right atrial pressure of 3 mmHg. IAS/Shunts: The interatrial septum was not well visualized. EKG: Rhythm strip during this exam demonstrates atrial flutter.  LEFT VENTRICLE PLAX 2D LVIDd:  4.53 cm LVIDs:         3.24 cm LV PW:         1.01 cm LV IVS:        1.08 cm LVOT diam:     1.90 cm LV SV:         35 LV SV Index:   19 LVOT Area:     2.84 cm  RIGHT VENTRICLE            IVC RV S prime:     7.61 cm/s  IVC diam: 1.60 cm LEFT ATRIUM             Index        RIGHT ATRIUM           Index LA diam:        4.20 cm 2.26 cm/m   RA Area:     10.10 cm LA Vol (A2C):   88.7 ml 47.73 ml/m  RA Volume:   18.60 ml  10.01 ml/m LA Vol (A4C):   61.4 ml 33.04 ml/m LA Biplane Vol: 75.4 ml 40.57 ml/m  AORTIC VALVE                    PULMONIC VALVE AV Area (Vmax):    2.27 cm     PV Vmax:       1.20 m/s AV Area (Vmean):   2.33 cm     PV Peak grad:  5.8 mmHg AV Area (VTI):     2.06 cm AV Vmax:           108.00 cm/s AV Vmean:          72.700 cm/s AV VTI:            0.168 m AV Peak Grad:      4.7 mmHg AV Mean Grad:      3.0 mmHg LVOT Vmax:         86.60 cm/s LVOT Vmean:        59.700 cm/s LVOT VTI:          0.122 m LVOT/AV VTI ratio: 0.73  AORTA Ao Root diam: 2.90 cm TRICUSPID VALVE TR Peak grad:   31.4 mmHg TR Vmax:        280.00 cm/s  SHUNTS Systemic VTI:  0.12 m Systemic Diam: 1.90 cm Yvonne Kendall MD Electronically signed by Yvonne Kendall MD Signature Date/Time: 07/05/2023/1:46:39 PM    Final    DG Chest Portable 1 View Result Date: 07/05/2023 CLINICAL DATA:  Tachycardia. EXAM: PORTABLE CHEST 1 VIEW COMPARISON:  Chest radiograph dated February 21, 2020. CT chest dated April 01, 2020. FINDINGS: The heart size and mediastinal contours are within normal limits. Prior median sternotomy and CABG. Aortic atherosclerosis. No focal consolidation, pleural effusion, or pneumothorax.  No acute osseous abnormality. IMPRESSION: No acute cardiopulmonary findings. Electronically Signed   By: Hart Robinsons M.D.   On: 07/05/2023 11:06     ASSESSMENT: Iron deficiency anemia  PLAN:    1. Iron deficiency anemia: Etiology unclear- likely multifactorial- CKD vs bleeding vs malabsorption (hernia) vs others. Endoscopy and colonoscopy have been negative for GI bleed. Colonoscopy 06/02/22 did reveal diverticula w/o evidence of bleeding and moderate grade I hemorrhoids. Endoscopy 01/14/21 revealed medium hiatal hernia and erosions w/o evidence of bleeding. Last received IV feraheme x 2 in February 2024. Tolerated well. Today, hmg 13.7, ferritin 30, iron sat 11%.  Symptomatic. Plan for feraheme x 1.   2.  Right renal cell  carcinoma: Patient underwent partial nephrectomy on 07/04/2020 at [facility] revealing stage I malignancy.  She follows up with [facility] every 6 months.  3. CKD- stage 3b- managed by Dr. Cherylann Ratel. Gfr 32. Reviewed role of CKD in anemia. No role for EPO currently.   Disposition:  Feraheme x 1 (prefers Friday) 6 mo- lab Day to week later see me +/- feraheme- la  I discussed the assessment and treatment plan with the patient. The patient was provided an opportunity to ask questions and all were answered. The patient agreed with the plan and demonstrated an understanding of the instructions.   The patient was advised to call back or seek an in-person evaluation if the symptoms worsen or if the condition fails to improve as anticipated.   I spent 15 minutes face-to-face video visit time dedicated to the care of this patient on the date of this encounter to include pre-visit review of labs, prior notes, face-to-face time with the patient, and post visit ordering of testing/documentation.   Alinda Dooms, NP   07/16/2023

## 2023-07-22 ENCOUNTER — Encounter: Payer: Self-pay | Admitting: Internal Medicine

## 2023-07-23 ENCOUNTER — Inpatient Hospital Stay: Payer: HMO

## 2023-07-23 VITALS — BP 131/82 | HR 56 | Temp 98.0°F | Resp 18

## 2023-07-23 DIAGNOSIS — D509 Iron deficiency anemia, unspecified: Secondary | ICD-10-CM

## 2023-07-23 MED ORDER — SODIUM CHLORIDE 0.9 % IV SOLN
Freq: Once | INTRAVENOUS | Status: AC
  Filled 2023-07-23: qty 250

## 2023-07-23 MED ORDER — SODIUM CHLORIDE 0.9 % IV SOLN
510.0000 mg | Freq: Once | INTRAVENOUS | Status: AC
  Administered 2023-07-23: 510 mg via INTRAVENOUS
  Filled 2023-07-23: qty 510

## 2023-07-26 ENCOUNTER — Encounter: Payer: Self-pay | Admitting: Oncology

## 2023-07-26 NOTE — Telephone Encounter (Signed)
 Philicia, do you know if this pt has a PA for cardiac MRI? Appt was 2/14 and msg would have come from [place]. Thanks!

## 2023-07-28 NOTE — Telephone Encounter (Signed)
 The patient has a f/u appt with you next week, but her in-lab sleep study ordered by Dr. Mayford Knife, and the cardiac MRI, are still pending scheduling. Do you want [place] to reschedule her appt until the tests have been completed? Thanks!

## 2023-07-30 ENCOUNTER — Ambulatory Visit
Admission: RE | Admit: 2023-07-30 | Discharge: 2023-07-30 | Disposition: A | Discharge status: Home / Self Care | Source: Ambulatory Visit | Attending: Family Medicine | Admitting: Family Medicine

## 2023-07-30 DIAGNOSIS — Z1231 Encounter for screening mammogram for malignant neoplasm of breast: Secondary | ICD-10-CM | POA: Diagnosis present

## 2023-08-04 ENCOUNTER — Encounter: Payer: Self-pay | Admitting: Nurse Practitioner

## 2023-08-06 ENCOUNTER — Encounter: Payer: HMO | Admitting: Internal Medicine

## 2023-08-11 ENCOUNTER — Other Ambulatory Visit: Payer: Self-pay

## 2023-08-11 MED ORDER — APIXABAN 5 MG PO TABS: 5.0000 mg | ORAL_TABLET | Freq: Two times a day (BID) | ORAL | 5 refills | Status: AC

## 2023-08-13 ENCOUNTER — Telehealth: Payer: Self-pay | Admitting: Family Medicine

## 2023-08-13 NOTE — Telephone Encounter (Signed)
 Dr Clent Ridges is leaving the practice and your New Patient or Transfer of Care appointment needs to be rescheduled with another provider. Please call the office to schedule a Transfer of Care to either Dr Charlann Lange, Darleen Crocker or Kara Dies, NP.   Thank you  E2C2, please reschedule this patient's TOC visit. [facility]

## 2023-08-16 ENCOUNTER — Telehealth: Payer: Self-pay | Admitting: Family Medicine

## 2023-08-16 NOTE — Telephone Encounter (Signed)
  Copied from CRM [id number]. Topic: Medical Record Request - Records Request >> Aug 16, 2023  9:46 AM [name] wrote: Reason for CRM: Patient is requesting her immunization record be sent to her email, [email or web address].

## 2023-08-16 NOTE — Telephone Encounter (Signed)
 Lvm for pt to give office a  call back. Okay to relay message:   Pt can get immunization records from [place] or come by office and a copy can be provided. We can't not email anything.

## 2023-08-19 ENCOUNTER — Encounter: Payer: Self-pay | Admitting: Nurse Practitioner

## 2023-08-20 ENCOUNTER — Encounter: Payer: Self-pay | Admitting: Oncology

## 2023-08-23 ENCOUNTER — Telehealth: Payer: Self-pay

## 2023-08-23 NOTE — Telephone Encounter (Deleted)
**Note De-Identified Elvia Aydin Obfuscation** I started a NPSG PA through the [employer] Portal and it is currently pending review. Outpatient Authorization [id number]  Per the pts request, I did call her with an update on her NPSG (sleep study).

## 2023-08-27 NOTE — Telephone Encounter (Signed)
**Note De-Identified Stehanie Ekstrom Obfuscation** I was unable to do this NPSG PA through the [employer] Portal because the pt was not found so I called [employer] and s/w Brianna who advised me that a PA cannot be done over the phone and she assisted me in finding the PA Forms on their website.  I printed the PA form, completed it, included clinical documents and then faxed all to [employer] at [phone] and I did receive confirmation that all 13 pages was received successfully.

## 2023-08-30 ENCOUNTER — Other Ambulatory Visit: Payer: Self-pay

## 2023-08-30 DIAGNOSIS — E7800 Pure hypercholesterolemia, unspecified: Secondary | ICD-10-CM

## 2023-08-30 MED ORDER — REPATHA SURECLICK 140 MG/ML ~~LOC~~ SOAJ: 140.0000 mg | SUBCUTANEOUS | 2 refills | Status: DC

## 2023-09-06 NOTE — Telephone Encounter (Signed)
**Note De-Identified Aleisa Howk Obfuscation** Letter received Belva Koziel fax from [employer] stating that they have approved this NPSG PA from 11/01/2023 until 01/30/2024. Auth #: [id number]  I have transferred the order to the sleep lab so they can contact the pt to schedule this test.

## 2023-09-15 ENCOUNTER — Telehealth: Payer: Self-pay | Admitting: Pharmacist

## 2023-09-15 ENCOUNTER — Encounter: Payer: Self-pay | Admitting: Internal Medicine

## 2023-09-15 NOTE — Telephone Encounter (Signed)
 She is expressing interest in the program you mentioned for mounjaro, if you could possibly give her more information about it. Thanks!

## 2023-09-15 NOTE — Telephone Encounter (Signed)
 Edda Goo, I see that you submitted a PA previously for OSA and that it was denied. I just wanted to pass this along to see if you could take a look and see if there were any other routes we could with her medical conditions. Thank you!

## 2023-09-15 NOTE — Telephone Encounter (Signed)
 Patient called to discuss the reasons for insurance rejection and options. She would prefer to recheck A1c later this year since the last reading was prediabetic.

## 2023-09-20 NOTE — Telephone Encounter (Signed)
 Lvm for pt to give office. Need more info as to why she needs an appt. Okay to relay, if relayed please notify office

## 2023-10-05 ENCOUNTER — Encounter: Payer: Self-pay | Admitting: Nurse Practitioner

## 2023-10-05 NOTE — Telephone Encounter (Signed)
 She has an appointment with you on 11/25/23.

## 2023-10-08 ENCOUNTER — Other Ambulatory Visit: Payer: Self-pay | Admitting: Nurse Practitioner

## 2023-10-08 DIAGNOSIS — I2510 Atherosclerotic heart disease of native coronary artery without angina pectoris: Secondary | ICD-10-CM

## 2023-10-08 MED ORDER — NITROGLYCERIN 0.4 MG SL SUBL: 0.4000 mg | SUBLINGUAL_TABLET | SUBLINGUAL | 0 refills | Status: AC | PRN

## 2023-10-15 ENCOUNTER — Encounter: Payer: Medicare Other | Admitting: Family Medicine

## 2023-10-19 ENCOUNTER — Encounter (HOSPITAL_COMMUNITY): Payer: Self-pay

## 2023-10-20 ENCOUNTER — Ambulatory Visit
Admission: RE | Admit: 2023-10-20 | Discharge: 2023-10-20 | Disposition: A | Discharge status: Home / Self Care | Source: Ambulatory Visit | Attending: Internal Medicine | Admitting: Internal Medicine

## 2023-10-20 ENCOUNTER — Other Ambulatory Visit: Payer: Self-pay | Admitting: Internal Medicine

## 2023-10-20 DIAGNOSIS — I493 Ventricular premature depolarization: Secondary | ICD-10-CM

## 2023-10-20 MED ORDER — GADOBUTROL 1 MMOL/ML IV SOLN
12.0000 mL | Freq: Once | INTRAVENOUS | Status: AC | PRN
  Administered 2023-10-20: 12 mL via INTRAVENOUS

## 2023-10-22 ENCOUNTER — Encounter: Payer: Medicare Other | Admitting: Family Medicine

## 2023-10-28 ENCOUNTER — Telehealth: Payer: Self-pay | Admitting: Internal Medicine

## 2023-10-28 NOTE — Telephone Encounter (Signed)
 Called to confirm/remind patient of [facility] on 10[facility].   Appointment:   [x] Confirmed  [] Left mess   [] No answer/No voice mail  [] VM Full/unable to leave message  [] Phone not in service  Patient reminded to bring all medications and/or complete list.  Confirmed patient has transportation. Gave directions, instructed to utilize valet parking.

## 2023-10-29 ENCOUNTER — Encounter (HOSPITAL_BASED_OUTPATIENT_CLINIC_OR_DEPARTMENT_OTHER): Payer: Self-pay

## 2023-10-29 ENCOUNTER — Ambulatory Visit: Admitting: Internal Medicine

## 2023-10-29 ENCOUNTER — Ambulatory Visit (HOSPITAL_BASED_OUTPATIENT_CLINIC_OR_DEPARTMENT_OTHER): Attending: Cardiology | Admitting: Cardiology

## 2023-10-29 ENCOUNTER — Other Ambulatory Visit
Admission: RE | Admit: 2023-10-29 | Discharge: 2023-10-29 | Disposition: A | Discharge status: Home / Self Care | Source: Ambulatory Visit | Attending: Internal Medicine | Admitting: Internal Medicine

## 2023-10-29 VITALS — Ht 62.0 in | Wt 189.0 lb

## 2023-10-29 VITALS — BP 161/83 | HR 64 | Wt 189.0 lb

## 2023-10-29 DIAGNOSIS — I129 Hypertensive chronic kidney disease with stage 1 through stage 4 chronic kidney disease, or unspecified chronic kidney disease: Secondary | ICD-10-CM | POA: Insufficient documentation

## 2023-10-29 DIAGNOSIS — R0609 Other forms of dyspnea: Secondary | ICD-10-CM | POA: Insufficient documentation

## 2023-10-29 DIAGNOSIS — E785 Hyperlipidemia, unspecified: Secondary | ICD-10-CM | POA: Insufficient documentation

## 2023-10-29 DIAGNOSIS — I493 Ventricular premature depolarization: Secondary | ICD-10-CM | POA: Insufficient documentation

## 2023-10-29 DIAGNOSIS — Z7901 Long term (current) use of anticoagulants: Secondary | ICD-10-CM | POA: Diagnosis not present

## 2023-10-29 DIAGNOSIS — I480 Paroxysmal atrial fibrillation: Secondary | ICD-10-CM | POA: Insufficient documentation

## 2023-10-29 DIAGNOSIS — N1832 Chronic kidney disease, stage 3b: Secondary | ICD-10-CM

## 2023-10-29 DIAGNOSIS — I471 Supraventricular tachycardia, unspecified: Secondary | ICD-10-CM | POA: Diagnosis not present

## 2023-10-29 DIAGNOSIS — I2510 Atherosclerotic heart disease of native coronary artery without angina pectoris: Secondary | ICD-10-CM

## 2023-10-29 DIAGNOSIS — D631 Anemia in chronic kidney disease: Secondary | ICD-10-CM | POA: Insufficient documentation

## 2023-10-29 DIAGNOSIS — Z6834 Body mass index (BMI) 34.0-34.9, adult: Secondary | ICD-10-CM | POA: Insufficient documentation

## 2023-10-29 DIAGNOSIS — Z951 Presence of aortocoronary bypass graft: Secondary | ICD-10-CM | POA: Insufficient documentation

## 2023-10-29 DIAGNOSIS — G4719 Other hypersomnia: Secondary | ICD-10-CM

## 2023-10-29 DIAGNOSIS — I25119 Atherosclerotic heart disease of native coronary artery with unspecified angina pectoris: Secondary | ICD-10-CM | POA: Insufficient documentation

## 2023-10-29 DIAGNOSIS — E669 Obesity, unspecified: Secondary | ICD-10-CM | POA: Insufficient documentation

## 2023-10-29 DIAGNOSIS — I10 Essential (primary) hypertension: Secondary | ICD-10-CM

## 2023-10-29 DIAGNOSIS — I252 Old myocardial infarction: Secondary | ICD-10-CM | POA: Insufficient documentation

## 2023-10-29 DIAGNOSIS — I4891 Unspecified atrial fibrillation: Secondary | ICD-10-CM

## 2023-10-29 DIAGNOSIS — Z79899 Other long term (current) drug therapy: Secondary | ICD-10-CM | POA: Insufficient documentation

## 2023-10-29 DIAGNOSIS — M25551 Pain in right hip: Secondary | ICD-10-CM | POA: Diagnosis not present

## 2023-10-29 LAB — COMPREHENSIVE METABOLIC PANEL WITH GFR
ALT: 14 U/L (ref 0–44)
AST: 18 U/L (ref 15–41)
Albumin: 4.1 g/dL (ref 3.5–5.0)
Alkaline Phosphatase: 47 U/L (ref 38–126)
Anion gap: 7 (ref 5–15)
BUN: 34 mg/dL — ABNORMAL HIGH (ref 8–23)
CO2: 25 mmol/L (ref 22–32)
Calcium: 9 mg/dL (ref 8.9–10.3)
Chloride: 109 mmol/L (ref 98–111)
Creatinine, Ser: 1.61 mg/dL — ABNORMAL HIGH (ref 0.44–1.00)
GFR, Estimated: 33 mL/min — ABNORMAL LOW (ref >60)
Glucose, Bld: 113 mg/dL — ABNORMAL HIGH (ref 70–99)
Potassium: 4.7 mmol/L (ref 3.5–5.1)
Sodium: 141 mmol/L (ref 135–145)
Total Bilirubin: 0.7 mg/dL (ref 0.0–1.2)
Total Protein: 7.3 g/dL (ref 6.5–8.1)

## 2023-10-29 LAB — TSH: TSH: 2.497 u[IU]/mL (ref 0.350–4.500)

## 2023-10-29 LAB — T4, FREE: Free T4: 0.92 ng/dL (ref 0.61–1.12)

## 2023-10-29 LAB — BRAIN NATRIURETIC PEPTIDE: B Natriuretic Peptide: 317.1 pg/mL — ABNORMAL HIGH (ref 0.0–100.0)

## 2023-10-29 MED ORDER — AMIODARONE HCL 100 MG PO TABS: 100.0000 mg | ORAL_TABLET | Freq: Every day | ORAL | 3 refills | Status: DC

## 2023-10-29 MED ORDER — ENTRESTO 49-51 MG PO TABS: 1.0000 | ORAL_TABLET | Freq: Two times a day (BID) | ORAL | 3 refills | Status: AC

## 2023-10-29 NOTE — Patient Instructions (Signed)
 Medication Changes:  STOP LOSARTAN AND METOPROLOL   START ENTRESTO 49-51 MG TWICE DAILY AND AMIODARONE  100 MG ONCE DAILY   Lab Work:  Go DOWN to LOWER LEVEL (LL) to have your blood work completed inside of [employer] office.  We will only call you if the results are abnormal or if the provider would like to make medication changes.   Follow-Up in: 4 MONTHS WITH DR. Julane Ny.  At the [facility], you and your health needs are our priority. We have a designated team specialized in the treatment of Heart Failure. This Care Team includes your primary Heart Failure Specialized Cardiologist (physician), Advanced Practice Providers (APPs- Physician Assistants and Nurse Practitioners), and Pharmacist who all work together to provide you with the care you need, when you need it.   You may see any of the following providers on your designated Care Team at your next follow up:  Dr. Jules Oar Dr. Peder Bourdon Dr. Alwin Baars Dr. Judyth Nunnery Shawnee Dellen, FNP Bevely Brush, RPH-CPP  Please be sure to bring in all your medications bottles to every appointment.   Need to Contact Us :  If you have any questions or concerns before your next appointment please send us  a message through [place] or call our office at [phone].    TO LEAVE A MESSAGE FOR THE NURSE SELECT OPTION 2, PLEASE LEAVE A MESSAGE INCLUDING: YOUR NAME DATE OF BIRTH CALL BACK NUMBER REASON FOR CALL**this is important as we prioritize the call backs  YOU WILL RECEIVE A CALL BACK THE SAME DAY AS LONG AS YOU CALL BEFORE 4:00 PM

## 2023-10-29 NOTE — Progress Notes (Signed)
 [facility] NOTE  Referring Physician: Pcp, No Primary Care: Pcp, No Primary Cardiologist: Constancia Delton, MD  Chief Complaint: Shortness of breath  HPI:  Debbie Delacruz is a 78 y.o. female with a hx of CAD/CABG x4 in 2016 (LIMA to LAD, SVG -OM,PDA, PL), hypertension, hyperlipidemia, iron  deficiency anemia, right renal mass, CKD 3b.  She was recently diagnosed with a right renal mass, evaluated by oncology, suspicious for underlying malignancy.  Resection is being considered.  Long h/o HTN. Had first MI at age 38 y/o. Had cath with stents. Developed recurrent angina in 2016. Cath with severe CAD. Underwent CABG x 4 with Dr. Alva Jewels 2016   Saw Dr. Rodolfo Clan in 2018 for recurrent syncope and felt to have dysautonomia   Saw Dr. Carl Charnley in 1/22 for pre-op clearance for right RCC. Myoview  2/22 EF 70% no ischemia  Had full right nephrectomy.   Echo 2/22 EF 50-55% moderate RV dysfunction. Mild to moderate MR Pulmonary pressures normal  Works FT for an Pensions consultant. Main issue is severe fatigue and DOE. Says she can't even walk to the mailbox before stopping. Also having pain in R hip. No edema, orthopnea or PND. No chest pain or pressure. + snoring. Has severe OSA but was unable to tolerate CPAP (has not been tested again in some time). Says she wakes herself up at night. Falls asleep instantly on sitting down.    I saw her for the first time 06/11/23 for evaluation of severe SOB and palpitation. We did echo and zio.   Zio 1/25: Sinus rhythm. 1% AF 243 runs SVT PVCs 14.5%   Went for echo 2/10 and found to be in AF with RVR at the time. Went to ER and broke spontaneously. Started on po amio.  Echo 45-50% in setting of AF with RVR RV ok Marked LAE  HST: 1/25 AHI 0.7  Moderate to severe snoring sats down to 92% -> sent for in lab study  cMRI 10/20/23 EF 58%  subendocardial (25-50% wall thickness) LGE mid-apical, anteroseptal segment and basal-mid inferolateral segment. RV ok mild MR  Here  for f/u: Feels a bit better but still very fatigued. No CP. Mild edema. Occasional orthopnea. 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 Has been unable to get GLP1 due to insurance denials. Has in-lab sleep study tonight  Prior notes Left heart cath 05/2016 patent LIMA to LAD, patent SVG to OM, patent SVG to PDA, occluded SVG to PL. Echo 05/2016, EF 55 to 60%, basal inferior hypokinesis, impaired relaxation.   Past Medical History:  Diagnosis Date   A-fib (HCC)    Anemia    Anginal pain (HCC)    Anxiety    Arthritis    CAD (coronary artery disease)    Cancer (HCC)    Carpal tunnel syndrome    bilateral   Cataract    bil removed cataracts   Coronary artery disease    2x stents, Dr. Beau Bound   Depression    Dysrhythmia    Family history of adverse reaction to anesthesia    "brother; S/P lipotripsy in [place]; transferred to [facility]; had to put him on life support for 2 days"   GERD (gastroesophageal reflux disease)    Headache    "weekly" (09/13/2014)   History of blood transfusion 05/2014   "we haven't figured out why I needed it yet" (09/13/2014)   History of hiatal hernia    Hyperglycemia    Hyperlipidemia  Hypertension    Kidney stone    Myocardial infarct [facility])    Myocardial infarct, old 1999   15 years ago   Obesity    PONV (postoperative nausea and vomiting)    Renal mass    S/P CABG x 4 09/18/2014   LIMA to LAD, SVG to PDA-dRCA sequentially, SVG to OM, EVH via right thigh    Sleep apnea    Sleep apnea    "don't use a mask" (09/13/2014)   Unstable angina (HCC)     Current Outpatient Medications  Medication Sig Dispense Refill   acetaminophen  (TYLENOL ) 500 MG tablet Take 500 mg by mouth every 6 (six) hours as needed for headache.     amLODipine  (NORVASC ) 10 MG tablet Take 1 tablet (10 mg total) by mouth daily. 30 tablet 6   apixaban  (ELIQUIS ) 5 MG TABS tablet Take 1 tablet (5 mg total) by mouth 2 (two) times daily. 60 tablet 5   aspirin  EC 81  MG tablet Take 1 tablet (81 mg total) by mouth daily.     Blood Pressure Monitoring (BLOOD PRESSURE KIT) DEVI Check blood pressure daily, brand at patient and insurance preference, dx code I10 1 each 0   Docusate Sodium  (DSS) 100 MG CAPS Take 1 capsule by mouth daily at 6 (six) AM.     Evolocumab  (REPATHA  SURECLICK) 140 MG/ML SOAJ Inject 140 mg into the skin every 14 (fourteen) days. 2 mL 2   ezetimibe  (ZETIA ) 10 MG tablet Take 1 tablet by mouth once daily 90 tablet 0   gentamicin ointment (GARAMYCIN) 0.1 % Apply 1 Application topically as needed (to nose as needed).     isosorbide  mononitrate (IMDUR ) 30 MG 24 hr tablet Take 1 tablet by mouth once daily 90 tablet 0   losartan (COZAAR) 100 MG tablet Take 100 mg by mouth daily.     metoprolol  succinate (TOPROL -XL) 50 MG 24 hr tablet TAKE 1 TABLET BY MOUTH ONCE DAILY . APPOINTMENT REQUIRED FOR FUTURE REFILLS 45 tablet 0   pantoprazole  (PROTONIX ) 40 MG tablet Take 1 tablet by mouth once daily 90 tablet 0   rosuvastatin  (CRESTOR ) 40 MG tablet Take 1 tablet (40 mg total) by mouth daily. 90 tablet 3   Sodium Sulfate-Mag Sulfate-KCl (SUTAB ) [phone] MG TABS Take 1 kit by mouth as directed. Patient to bring in coupon 24 tablet 0   tirzepatide 5 MG/0.5ML injection vial Inject 5 mg into the skin once a week. Start after completing the 4 weeks of the 2.5 mg dose. 2 mL 1   amiodarone  (PACERONE ) 200 MG tablet Take 2 tablets (400 mg total) by mouth 2 (two) times daily for 7 days, THEN 1 tablet (200 mg total) 2 (two) times daily for 7 days, THEN 1 tablet (200 mg total) daily. 88 tablet 0   nitroGLYCERIN  (NITROSTAT ) 0.4 MG SL tablet Place 1 tablet (0.4 mg total) under the tongue every 5 (five) minutes as needed for chest pain. Take for up to 3 doses and then call 911 if still having chest pain. (Patient not taking: Reported on 10/29/2023) 30 tablet 0   No current facility-administered medications for this visit.    Allergies  Allergen Reactions   Ferrous  Fumarate Other (See Comments)    STOMACH ISSUES STOMACH ISSUES STOMACH ISSUES   Lipitor  [Atorvastatin ] Other (See Comments)    Myalgia      Social History   Socioeconomic History   Marital status: Married    Spouse name: Not on file  Number of children: Not on file   Years of education: Not on file   Highest education level: Not on file  Occupational History   Not on file  Tobacco Use   Smoking status: Never    Passive exposure: Never   Smokeless tobacco: Never  Vaping Use   Vaping status: Never Used  Substance and Sexual Activity   Alcohol use: Not Currently    Alcohol/week: 0.0 standard drinks of alcohol   Drug use: No   Sexual activity: Not Currently    Birth control/protection: Post-menopausal  Other Topics Concern   Not on file  Social History Narrative   ** Merged History Encounter **       Lives in [place] with 10 YO nephew and husband. 3 dogs outside.  Work - Therapist, sports, [employer]  Diet - regular, limited meat  Exercise - walks some   Social Drivers of Corporate investment banker Strain: Low Risk  (03/26/2023)   Overall Financial Resource Strain (CARDIA)    Difficulty of Paying Living Expenses: Not hard at all  Food Insecurity: No Food Insecurity (04/30/2023)   Hunger Vital Sign    Worried About Running Out of Food in the Last Year: Never true    Ran Out of Food in the Last Year: Never true  Transportation Needs: No Transportation Needs (04/30/2023)   PRAPARE - Administrator, Civil Service (Medical): No    Lack of Transportation (Non-Medical): No  Physical Activity: Insufficiently Active (03/26/2023)   Exercise Vital Sign    Days of Exercise per Week: 2 days    Minutes of Exercise per Session: 30 min  Stress: No Stress Concern Present (03/26/2023)   [employer] of Occupational Health - Occupational Stress Questionnaire    Feeling of Stress : Not at all  Social Connections: Socially Integrated (03/26/2023)   Social  Connection and Isolation Panel [NHANES]    Frequency of Communication with Friends and Family: More than three times a week    Frequency of Social Gatherings with Friends and Family: Never    Attends Religious Services: More than 4 times per year    Active Member of [employer] or Organizations: Yes    Attends Banker Meetings: Never    Marital Status: Married  Catering manager Violence: Not At Risk (04/30/2023)   Humiliation, Afraid, Rape, and Kick questionnaire    Fear of Current or Ex-Partner: No    Emotionally Abused: No    Physically Abused: No    Sexually Abused: No      Family History  Problem Relation Age of Onset   Coronary artery disease Mother    Osteoporosis Mother    Heart disease Mother    Stroke Mother    Heart attack Mother    Hypertension Mother    Hypertension Father    Coronary artery disease Father    COPD Father    Heart disease Father    Heart attack Father    Glaucoma Father    Osteoporosis Father    Emphysema Father    Cancer Sister 58       colon   Hypertension Sister    Colon cancer Sister        dx in her 33's   Breast cancer Sister    Colon polyps Sister    Stroke Brother    Hypertension Brother    Colon polyps Brother    Stroke Maternal Grandfather    Stroke Maternal Grandmother  Osteoporosis Maternal Grandmother    Hypertension Maternal Grandmother    Hypertension Paternal Grandmother    Hypertension Paternal Grandfather    Breast cancer Maternal Aunt        2 aunts-50's   Esophageal cancer Neg Hx    Stomach cancer Neg Hx    Rectal cancer Neg Hx    Pancreatic cancer Neg Hx     Vitals:   10/29/23 0915  BP: (!) 161/83  Pulse: 64  SpO2: 98%  Weight: 189 lb (85.7 kg)   Wt Readings from Last 3 Encounters:  10/29/23 189 lb (85.7 kg)  07/09/23 189 lb 4 oz (85.8 kg)  07/05/23 187 lb (84.8 kg)     PHYSICAL EXAM: General:  Well appearing. No resp difficulty General:  Well appearing. No resp difficulty HEENT:  normal Neck: supple. no JVD. Carotids 2+ bilat; no bruits. No lymphadenopathy or thryomegaly appreciated. Cor: PMI nondisplaced. Regular rate & rhythm. No rubs, gallops or murmurs. Lungs: clear Abdomen: obese soft, nontender, nondistended. No hepatosplenomegaly. No bruits or masses. Good bowel sounds. Extremities: no cyanosis, clubbing, rash, very mild ankle edema Neuro: alert & orientedx3, cranial nerves grossly intact. moves all 4 extremities w/o difficulty. Affect pleasant  ASSESSMENT & PLAN:  1. Profound fatigue/dyspnea on exertion - I continue to suspect the main driver here is severe untreated OSA +/- frequent PVCs bu o likely a component of diastolic HF (BNP 865 today) - unable to tolerate CPAP in past but that was years ago - However HST 1/25 was negative. (Noted heavy snoring but AHI < 1.0) -> we have referred for in-lab study as suspicion for significant OSA remains very high - Has in-lab sleep study tonight. Await results  - Adding Entresto - SGLT2i soon  2. OSA, untreated - HST 2/25: AHI 0.7  Moderate to severe snoring sats down to 92% -> sent for in lab studyplan as above - As above sleep study tonight - If unable to tolerate would recommed GLP1RA  3. RV dysfunction - suspect OSA/OHS. - Echo 2/10 EF 45-50% in setting of AF with RVR. RV ok  - cMRI 5/25 RV normal   4. Frequent PVCs/SVT - Zio 1/25: Sinus rhythm. 1% AF 243 runs SVT PVCs 14.5%  - now on amio for AF. PVCs suppressed - continue amio for now. Eventually wean to mexilitene - amio labs today  5. PAF - In NSR. continue amio for now.  - continue Eliquis  5 bid. No bleeding  6. CAD s/p CABG - no s/s angina - continue Eliquis /statin/zetia  - cMRI 10/20/23 EF 58%  subendocardial (25-50% wall thickness) LGE mid-apical, anteroseptal segment and basal-mid inferolateral segment. RV ok mild MR  7. CKD 3b  - baseline Scr 1.6 in setting of solitary kidney with h/o R nephrectomy due to RCC - Continue losartan - Scr  1.6 today Personally reviewed - Add SGLT2i soon  8. Obesity - Body mass index is 34.57 kg/m. - she has discussed Mounjaro with PCP and HF PharmD but unable to get approval. Will continue to get approval as I think weight loss will help her tremendously - d/w HF PharmD  9. HTN - BP elevated. Switch losartan to Entresto 49/51 bid  Nyron Mozer, MD  9:49 AM

## 2023-10-30 LAB — T3, FREE: T3, Free: 2.9 pg/mL (ref 2.0–4.4)

## 2023-10-31 NOTE — Procedures (Signed)
   Maryan Smalling [facility] [address] [place], [place] [address] Tel: [phone]   Fax: [phone]  Polysomnography Interpretation  Patient Name:  Debbie Delacruz, Debbie Delacruz Date:  10/29/2023 Referring Physician:  Gaylyn Keas  MD  Indications for Polysomnography The patient is a 78 year-old Female who is 5\' 2"  and weighs 189.0 lbs. Her BMI equals 34.8.  A full night polysomnogram was performed to evaluate for Obstructive Sleep Apnea.  Medication  No Data.   Polysomnogram Data A full night polysomnogram recorded the standard physiologic parameters including EEG, EOG, EMG, EKG, nasal and oral airflow.  Respiratory parameters of chest and abdominal movements were recorded with Respiratory Inductance Plethysmography belts.  Oxygen saturation was recorded by pulse oximetry.   Sleep Architecture The total recording time of the polysomnogram was 417.3 minutes.  The total sleep time was 357.5 minutes.  The patient spent 0.0% of total sleep time in Stage N1, 29.5% in Stage N2, 46.9% in Stages N3, and 23.6% in REM.  Sleep latency was 28.6 minutes.  REM latency was 80.5 minutes.  Sleep Efficiency was 85.7%.  Wake after Sleep Onset time was 31.0 minutes.  Respiratory Events The polysomnogram revealed a presence of 0 obstructive, 0 central, and 0 mixed apneas resulting in an Apnea index of 0 events per hour.  There were 22 hypopneas (>=3% desaturation and/or arousal) resulting in an Apnea\Hypopnea Index (AHI >=3% desaturation and/or arousal) of 3.7 events per hour.  There were 9 hypopneas (>=4% desaturation) resulting in an Apnea\Hypopnea Index (AHI >=4% desaturation) of 1.5 events per hour.  There were 0 Respiratory Effort Related Arousals resulting in a RERA index of 0 events per hour. The Respiratory Disturbance Index is 3.7 events per hour.  The snore index was 0 events per hour.  Mean oxygen saturation was 95.2%.  The lowest oxygen saturation during sleep was 90.0%.  Time  spent <=88% oxygen saturation was 0 minutes  Limb Activity There were 0 total limb movements recorded.  Cardiac Summary The average pulse rate was 65.6 bpm.  The minimum pulse rate was 33.0 bpm while the maximum pulse rate was 118.0 bpm.  Cardiac rhythm was abnormal with occasional PVCs and several runs of paroxysmal atrial fibrillation.  Diagnosis:  Normal Study Paroxysmal Atrial Fibrillation  Recommendations: The patient should be counseled in good sleep hygiene.  Encourage the patient to avoid driving when sleepy. The patient should be counseled to avoid sleeping in the supine position.    This study was personally reviewed and electronically signed by: Aubrey Leaf MD Accredited Board Certified in Sleep Medicine Date/Time: 10/31/2023 9:34PM

## 2023-11-02 ENCOUNTER — Telehealth: Payer: Self-pay

## 2023-11-02 NOTE — Telephone Encounter (Signed)
 Notified patient via VM, per DPR, of sleep study results and recommendations. Left callback number for any questions and/or concerns.

## 2023-11-02 NOTE — Telephone Encounter (Signed)
-----   Message from Gaylyn Keas sent at 10/31/2023  9:36 PM EDT ----- Please let patient know that sleep study showed no significant sleep apnea.

## 2023-11-20 ENCOUNTER — Other Ambulatory Visit: Payer: Self-pay | Admitting: Internal Medicine

## 2023-11-20 DIAGNOSIS — E7800 Pure hypercholesterolemia, unspecified: Secondary | ICD-10-CM

## 2023-11-25 ENCOUNTER — Encounter: Payer: Self-pay | Admitting: Family

## 2023-11-25 ENCOUNTER — Telehealth (HOSPITAL_COMMUNITY): Payer: Self-pay

## 2023-11-25 ENCOUNTER — Encounter: Admitting: Nurse Practitioner

## 2023-11-25 ENCOUNTER — Ambulatory Visit: Attending: Family | Admitting: Family

## 2023-11-25 ENCOUNTER — Other Ambulatory Visit (HOSPITAL_COMMUNITY): Payer: Self-pay

## 2023-11-25 VITALS — BP 129/67 | HR 80 | Wt 195.8 lb

## 2023-11-25 DIAGNOSIS — I5022 Chronic systolic (congestive) heart failure: Secondary | ICD-10-CM

## 2023-11-25 DIAGNOSIS — R600 Localized edema: Secondary | ICD-10-CM | POA: Diagnosis not present

## 2023-11-25 DIAGNOSIS — I129 Hypertensive chronic kidney disease with stage 1 through stage 4 chronic kidney disease, or unspecified chronic kidney disease: Secondary | ICD-10-CM | POA: Diagnosis not present

## 2023-11-25 DIAGNOSIS — E669 Obesity, unspecified: Secondary | ICD-10-CM | POA: Diagnosis not present

## 2023-11-25 DIAGNOSIS — Z7901 Long term (current) use of anticoagulants: Secondary | ICD-10-CM | POA: Diagnosis not present

## 2023-11-25 DIAGNOSIS — G4719 Other hypersomnia: Secondary | ICD-10-CM | POA: Diagnosis not present

## 2023-11-25 DIAGNOSIS — Z951 Presence of aortocoronary bypass graft: Secondary | ICD-10-CM | POA: Insufficient documentation

## 2023-11-25 DIAGNOSIS — E785 Hyperlipidemia, unspecified: Secondary | ICD-10-CM

## 2023-11-25 DIAGNOSIS — Z905 Acquired absence of kidney: Secondary | ICD-10-CM | POA: Diagnosis not present

## 2023-11-25 DIAGNOSIS — Z6835 Body mass index (BMI) 35.0-35.9, adult: Secondary | ICD-10-CM | POA: Insufficient documentation

## 2023-11-25 DIAGNOSIS — R0609 Other forms of dyspnea: Secondary | ICD-10-CM | POA: Insufficient documentation

## 2023-11-25 DIAGNOSIS — I480 Paroxysmal atrial fibrillation: Secondary | ICD-10-CM | POA: Insufficient documentation

## 2023-11-25 DIAGNOSIS — I493 Ventricular premature depolarization: Secondary | ICD-10-CM | POA: Diagnosis not present

## 2023-11-25 DIAGNOSIS — I10 Essential (primary) hypertension: Secondary | ICD-10-CM

## 2023-11-25 DIAGNOSIS — D509 Iron deficiency anemia, unspecified: Secondary | ICD-10-CM | POA: Diagnosis not present

## 2023-11-25 DIAGNOSIS — Z79899 Other long term (current) drug therapy: Secondary | ICD-10-CM | POA: Diagnosis not present

## 2023-11-25 DIAGNOSIS — I2510 Atherosclerotic heart disease of native coronary artery without angina pectoris: Secondary | ICD-10-CM | POA: Diagnosis not present

## 2023-11-25 DIAGNOSIS — N1832 Chronic kidney disease, stage 3b: Secondary | ICD-10-CM | POA: Insufficient documentation

## 2023-11-25 DIAGNOSIS — R5383 Other fatigue: Secondary | ICD-10-CM | POA: Diagnosis not present

## 2023-11-25 DIAGNOSIS — G4733 Obstructive sleep apnea (adult) (pediatric): Secondary | ICD-10-CM | POA: Diagnosis not present

## 2023-11-25 DIAGNOSIS — Z7984 Long term (current) use of oral hypoglycemic drugs: Secondary | ICD-10-CM | POA: Insufficient documentation

## 2023-11-25 DIAGNOSIS — M7989 Other specified soft tissue disorders: Secondary | ICD-10-CM | POA: Diagnosis present

## 2023-11-25 MED ORDER — JARDIANCE 10 MG PO TABS: 10.0000 mg | ORAL_TABLET | Freq: Every day | ORAL | 11 refills | Status: AC

## 2023-11-25 NOTE — Telephone Encounter (Signed)
 Advanced Heart Failure Patient Advocate Encounter  Test billing for this patients current coverage shows copays of $47 for 30 days, $117.50 for 90 days of Jardiance.  Rachel DEL, CPhT Rx Patient Advocate Phone: [phone]

## 2023-11-25 NOTE — Patient Instructions (Signed)
 Medication Changes:  START Jardiance 10mg  (1 tab) daily  Special Instructions // Education:  PLEASE WEAR COMPRESSION SOCKS DAILY  Follow-Up in: Please follow up with the [facility] in 3-4 weeks with Ellouise Class, FNP.  At the [facility], you and your health needs are our priority. We have a designated team specialized in the treatment of Heart Failure. This Care Team includes your primary Heart Failure Specialized Cardiologist (physician), Advanced Practice Providers (APPs- Physician Assistants and Nurse Practitioners), and Pharmacist who all work together to provide you with the care you need, when you need it.   You may see any of the following providers on your designated Care Team at your next follow up:  Dr. Toribio Fuel Dr. Ezra Shuck Dr. Ria Commander Dr. Odis Brownie Ellouise Class, FNP Jaun Bash, RPH-CPP  Please be sure to bring in all your medications bottles to every appointment.   Need to Contact Us :  If you have any questions or concerns before your next appointment please send us  a message through [place] or call our office at [phone].    TO LEAVE A MESSAGE FOR THE NURSE SELECT OPTION 2, PLEASE LEAVE A MESSAGE INCLUDING: YOUR NAME DATE OF BIRTH CALL BACK NUMBER REASON FOR CALL**this is important as we prioritize the call backs  YOU WILL RECEIVE A CALL BACK THE SAME DAY AS LONG AS YOU CALL BEFORE 4:00 PM

## 2023-11-25 NOTE — Telephone Encounter (Signed)
 Noted

## 2023-11-25 NOTE — Progress Notes (Signed)
 ADVANCED HF CLINIC NOTE  Referring Physician:  Primary Care:  Primary Care  Primary Cardiologist: Redell Cave, MD  Chief Complaint: swelling of feet, lower legs   HPI:  Debbie Delacruz is a 78 y.o. female with a hx of CAD/CABG x4 in 2016 (LIMA to LAD, SVG -OM,PDA, PL), hypertension, hyperlipidemia, iron  deficiency anemia, right renal mass, CKD 3b.  She was recently diagnosed with a right renal mass, evaluated by oncology, suspicious for underlying malignancy.  Resection is being considered.  Long h/o HTN. Had first MI at age 33 y/o. Had cath with stents. Developed recurrent angina in 2016. Cath with severe CAD. Underwent CABG x 4 with Dr. Dusty 2016   Saw Dr. Fernande in 2018 for recurrent syncope and felt to have dysautonomia   Saw Dr. Budd in 1/22 for pre-op clearance for right RCC. Myoview  2/22 EF 70% no ischemia  Had full right nephrectomy.   Echo 2/22 EF 50-55% moderate RV dysfunction. Mild to moderate MR Pulmonary pressures normal  Seen for the first time 06/11/23 for evaluation of severe SOB and palpitation. We did echo and zio.   Zio 1/25: Sinus rhythm. 1% AF 243 runs SVT PVCs 14.5%   Went for echo 2/10 and found to be in AF with RVR at the time. Went to ER and broke spontaneously. Started on po amio.  Echo 45-50% in setting of AF with RVR RV ok Marked LAE  HST: 1/25 AHI 0.7  Moderate to severe snoring sats down to 92% -> sent for in lab study  cMRI 10/20/23 EF 58%  subendocardial (25-50% wall thickness) LGE mid-apical, anteroseptal segment and basal-mid inferolateral segment. RV ok mild MR  She presents today for an acute HF visit with chief complaint of bilateral pedal edema. Edema typically resolves overnight but didn't last night. No change in diet/ fluid intake. Has associated fatigue along with this. Denies shortness of breath, chest pain, palpitations, abdominal distention or dizziness. She says that she's been very busy taking her husband to several  appointments.   NAS. Drinking water 2-3 bottles daily.   Prior notes Left heart cath 05/2016 patent LIMA to LAD, patent SVG to OM, patent SVG to PDA, occluded SVG to PL. Echo 05/2016, EF 55 to 60%, basal inferior hypokinesis, impaired relaxation.   ROS: All systems negative except what is listed in HPI, PMH and Problem List   Past Medical History:  Diagnosis Date   A-fib (HCC)    Anemia    Anginal pain (HCC)    Anxiety    Arthritis    CAD (coronary artery disease)    Cancer (HCC)    Carpal tunnel syndrome    bilateral   Cataract    bil removed cataracts   Coronary artery disease    2x stents, Dr. Florencio   Depression    Dysrhythmia    Family history of adverse reaction to anesthesia    brother; S/P lipotripsy in [place]; transferred to [facility]; had to put him on life support for 2 days   GERD (gastroesophageal reflux disease)    Headache    weekly (09/13/2014)   History of blood transfusion 05/2014   we haven't figured out why I needed it yet (09/13/2014)   History of hiatal hernia    Hyperglycemia    Hyperlipidemia    Hypertension    Kidney stone    Myocardial infarct [facility])    Myocardial infarct, old 1999   15 years ago   Obesity  PONV (postoperative nausea and vomiting)    Renal mass    S/P CABG x 4 09/18/2014   LIMA to LAD, SVG to PDA-dRCA sequentially, SVG to OM, EVH via right thigh    Sleep apnea    Sleep apnea    don't use a mask (09/13/2014)   Unstable angina (HCC)     Current Outpatient Medications  Medication Sig Dispense Refill   acetaminophen  (TYLENOL ) 500 MG tablet Take 500 mg by mouth every 6 (six) hours as needed for headache.     amiodarone  (PACERONE ) 100 MG tablet Take 1 tablet (100 mg total) by mouth daily. 90 tablet 3   amLODipine  (NORVASC ) 10 MG tablet Take 1 tablet (10 mg total) by mouth daily. 30 tablet 6   apixaban  (ELIQUIS ) 5 MG TABS tablet Take 1 tablet (5 mg total) by mouth 2 (two) times daily. 60 tablet 5   aspirin   EC 81 MG tablet Take 1 tablet (81 mg total) by mouth daily.     Blood Pressure Monitoring (BLOOD PRESSURE KIT) DEVI Check blood pressure daily, brand at patient and insurance preference, dx code I10 1 each 0   Docusate Sodium  (DSS) 100 MG CAPS Take 1 capsule by mouth daily at 6 (six) AM.     Evolocumab  (REPATHA  SURECLICK) 140 MG/ML SOAJ Inject 140 mg into the skin every 14 (fourteen) days. 2 mL 2   ezetimibe  (ZETIA ) 10 MG tablet Take 1 tablet by mouth once daily 90 tablet 0   gentamicin ointment (GARAMYCIN) 0.1 % Apply 1 Application topically as needed (to nose as needed).     isosorbide  mononitrate (IMDUR ) 30 MG 24 hr tablet Take 1 tablet by mouth once daily 90 tablet 0   metoprolol  succinate (TOPROL -XL) 50 MG 24 hr tablet TAKE 1 TABLET BY MOUTH ONCE DAILY . APPOINTMENT REQUIRED FOR FUTURE REFILLS 45 tablet 0   nitroGLYCERIN  (NITROSTAT ) 0.4 MG SL tablet Place 1 tablet (0.4 mg total) under the tongue every 5 (five) minutes as needed for chest pain. Take for up to 3 doses and then call 911 if still having chest pain. 30 tablet 0   pantoprazole  (PROTONIX ) 40 MG tablet Take 1 tablet by mouth once daily 90 tablet 0   rosuvastatin  (CRESTOR ) 40 MG tablet Take 1 tablet (40 mg total) by mouth daily. 90 tablet 3   sacubitril-valsartan (ENTRESTO ) 49-51 MG Take 1 tablet by mouth 2 (two) times daily. 180 tablet 3   Sodium Sulfate-Mag Sulfate-KCl (SUTAB ) [phone] MG TABS Take 1 kit by mouth as directed. Patient to bring in coupon 24 tablet 0   No current facility-administered medications for this visit.    Allergies  Allergen Reactions   Ferrous Fumarate Other (See Comments)    STOMACH ISSUES STOMACH ISSUES STOMACH ISSUES   Lipitor  [Atorvastatin ] Other (See Comments)    Myalgia      Social History   Socioeconomic History   Marital status: Married    Spouse name: Not on file   Number of children: Not on file   Years of education: Not on file   Highest education level: Not on file   Occupational History   Not on file  Tobacco Use   Smoking status: Never    Passive exposure: Never   Smokeless tobacco: Never  Vaping Use   Vaping status: Never Used  Substance and Sexual Activity   Alcohol use: Not Currently    Alcohol/week: 0.0 standard drinks of alcohol   Drug use: No   Sexual activity: Not Currently  Birth control/protection: Post-menopausal  Other Topics Concern   Not on file  Social History Narrative   ** Merged History Encounter **       Lives in [place] with 10 YO nephew and husband. 3 dogs outside.  Work - Therapist, sports, [employer]  Diet - regular, limited meat  Exercise - walks some   Social Drivers of Corporate investment banker Strain: Low Risk  (03/26/2023)   Overall Financial Resource Strain (CARDIA)    Difficulty of Paying Living Expenses: Not hard at all  Food Insecurity: No Food Insecurity (04/30/2023)   Hunger Vital Sign    Worried About Running Out of Food in the Last Year: Never true    Ran Out of Food in the Last Year: Never true  Transportation Needs: No Transportation Needs (04/30/2023)   PRAPARE - Administrator, Civil Service (Medical): No    Lack of Transportation (Non-Medical): No  Physical Activity: Insufficiently Active (03/26/2023)   Exercise Vital Sign    Days of Exercise per Week: 2 days    Minutes of Exercise per Session: 30 min  Stress: No Stress Concern Present (03/26/2023)   [employer] of Occupational Health - Occupational Stress Questionnaire    Feeling of Stress : Not at all  Social Connections: Socially Integrated (03/26/2023)   Social Connection and Isolation Panel    Frequency of Communication with Friends and Family: More than three times a week    Frequency of Social Gatherings with Friends and Family: Never    Attends Religious Services: More than 4 times per year    Active Member of [employer] or Organizations: Yes    Attends Banker Meetings: Never    Marital Status:  Married  Catering manager Violence: Not At Risk (04/30/2023)   Humiliation, Afraid, Rape, and Kick questionnaire    Fear of Current or Ex-Partner: No    Emotionally Abused: No    Physically Abused: No    Sexually Abused: No      Family History  Problem Relation Age of Onset   Coronary artery disease Mother    Osteoporosis Mother    Heart disease Mother    Stroke Mother    Heart attack Mother    Hypertension Mother    Hypertension Father    Coronary artery disease Father    COPD Father    Heart disease Father    Heart attack Father    Glaucoma Father    Osteoporosis Father    Emphysema Father    Cancer Sister 61       colon   Hypertension Sister    Colon cancer Sister        dx in her 31's   Breast cancer Sister    Colon polyps Sister    Stroke Brother    Hypertension Brother    Colon polyps Brother    Stroke Maternal Grandfather    Stroke Maternal Grandmother    Osteoporosis Maternal Grandmother    Hypertension Maternal Grandmother    Hypertension Paternal Grandmother    Hypertension Paternal Grandfather    Breast cancer Maternal Aunt        2 aunts-50's   Esophageal cancer Neg Hx    Stomach cancer Neg Hx    Rectal cancer Neg Hx    Pancreatic cancer Neg Hx    Vitals:   11/25/23 1109  BP: 129/67  Pulse: 80  SpO2: 99%  Weight: 195 lb 12.8 oz (88.8 kg)   Wt  Readings from Last 3 Encounters:  11/25/23 195 lb 12.8 oz (88.8 kg)  10/29/23 189 lb (85.7 kg)  10/29/23 189 lb (85.7 kg)   Lab Results  Component Value Date   CREATININE 1.61 (H) 10/29/2023   CREATININE 1.64 (H) 07/09/2023   CREATININE 1.47 (H) 07/05/2023    PHYSICAL EXAM:  General: Well appearing. No resp difficulty HEENT: normal Neck: supple, no JVD Cor: Regular rhythm, rate. No rubs, gallops or murmurs Lungs: clear Abdomen: soft, nontender, nondistended. Extremities: no cyanosis, clubbing, rash, 1+ pitting edema bilateral lower legs Neuro: alert & oriented X 3. Moves all 4 extremities  w/o difficulty. Affect pleasant   ASSESSMENT & PLAN:  1. Profound fatigue/dyspnea on exertion - I continue to suspect the main driver here is severe untreated OSA +/- frequent PVCs but likely a component of diastolic HF (BNP 07/25/72 was 317) - unable to tolerate CPAP in past but that was years ago - However HST 1/25 was negative. (Noted heavy snoring but AHI < 1.0)  - Had in-lab sleep study 06/25 - Continue entresto  49/51mg  BID - Begin jardiance 10mg  daily (30 day voucher given) - BMET next visit  2. OSA, untreated - HST 2/25: AHI 0.7  Moderate to severe snoring sats down to 92%  - sleep study done 06/25  3. RV dysfunction - suspect OSA/OHS. - Echo 2/10 EF 45-50% in setting of AF with RVR. RV ok  - cMRI 5/25 RV normal  - continue entresto  and begin jardiance per above - wear compression socks daily with removal at bedtime  4. Frequent PVCs/SVT - Zio 1/25: Sinus rhythm. 1% AF 243 runs SVT PVCs 14.5%  - now on amio for AF. PVCs suppressed - continue amio for now. Eventually wean to mexilitene  5. PAF - In NSR. continue amio for now.  - continue Eliquis  5 bid. No bleeding  6. CAD s/p CABG - no s/s angina - continue Eliquis /statin/zetia  - cMRI 10/20/23 EF 58%  subendocardial (25-50% wall thickness) LGE mid-apical, anteroseptal segment and basal-mid inferolateral segment. RV ok mild MR  7. CKD 3b  - baseline Scr 1.6 in setting of solitary kidney with h/o R nephrectomy due to RCC - Continue entresto  - Begin jardiance 10mg  daily  8. Obesity - Body mass index is 35.81 kg/m. - unable to get GLP1 approved by insurance  9. HTN - BP 129/67 - to see PCP Ester) in 2 weeks - BMET 10/29/23 reviewed: sodium 141, potassium 4.7, creatinine 1.61 & GFR 33 - BMET next visit   Return in 3 weeks, sooner if needed.   Ellouise DELENA Class, FNP  11:20 AM 11/25/23

## 2023-11-30 ENCOUNTER — Encounter: Payer: Self-pay | Admitting: Internal Medicine

## 2023-12-10 ENCOUNTER — Encounter: Admitting: Nurse Practitioner

## 2023-12-23 ENCOUNTER — Encounter: Admitting: Nurse Practitioner

## 2023-12-30 ENCOUNTER — Telehealth: Payer: Self-pay | Admitting: Family

## 2023-12-30 ENCOUNTER — Other Ambulatory Visit: Payer: Self-pay | Admitting: Internal Medicine

## 2023-12-30 ENCOUNTER — Telehealth: Payer: Self-pay

## 2023-12-30 ENCOUNTER — Other Ambulatory Visit (HOSPITAL_COMMUNITY): Payer: Self-pay

## 2023-12-30 DIAGNOSIS — E7800 Pure hypercholesterolemia, unspecified: Secondary | ICD-10-CM

## 2023-12-30 NOTE — Telephone Encounter (Signed)
 Called to confirm/remind patient of their appointment at [facility] on 12/31/23.   Appointment:   [x] Confirmed  [] Left mess   [] No answer/No voice mail  [] VM Full/unable to leave message  [] Phone not in service  Patient reminded to bring all medications and/or complete list.  Confirmed patient has transportation. Gave directions, instructed to utilize valet parking.

## 2023-12-30 NOTE — Telephone Encounter (Signed)
 Pharmacy Patient Advocate Encounter   Received notification from CoverMyMeds that prior authorization for Repatha  SureClick 140MG /ML auto-injectors is required/requested.   Insurance verification completed.   The patient is insured through [facility] ADVANTAGE/RX ADVANCE .   Per test claim: PA required; PA submitted to above mentioned insurance via CoverMyMeds Key/confirmation #/EOC [facility] Status is pending

## 2023-12-30 NOTE — Progress Notes (Unsigned)
 ADVANCED HF CLINIC NOTE  Referring Physician:  Primary Care: [facility]  Primary Cardiologist: Redell Cave, MD  Chief Complaint: fatigue   HPI:  Debbie Delacruz is a 78 y.o. female with a hx of CAD/CABG x4 in 2016 (LIMA to LAD, SVG -OM,PDA, PL), hypertension, hyperlipidemia, iron  deficiency anemia, right renal mass, CKD 3b.  She was recently diagnosed with a right renal mass, evaluated by oncology, suspicious for underlying malignancy.  Resection is being considered.  Long h/o HTN. Had first MI at age 49 y/o. Had cath with stents. Developed recurrent angina in 2016. Cath with severe CAD. Underwent CABG x 4 with Dr. Dusty 2016   Saw Dr. Fernande in 2018 for recurrent syncope and felt to have dysautonomia   Saw Dr. Budd in 1/22 for pre-op clearance for right RCC. Myoview  2/22 EF 70% no ischemia  Had full right nephrectomy.   Echo 2/22 EF 50-55% moderate RV dysfunction. Mild to moderate MR Pulmonary pressures normal  Seen for the first time 06/11/23 for evaluation of severe SOB and palpitation. We did echo and zio.   Zio 1/25: Sinus rhythm. 1% AF 243 runs SVT PVCs 14.5%   Went for echo 2/10 and found to be in AF with RVR at the time. Went to ER and broke spontaneously. Started on po amio.  Echo 45-50% in setting of AF with RVR RV ok Marked LAE  HST: 1/25 AHI 0.7  Moderate to severe snoring sats down to 92% -> sent for in lab study  cMRI 10/20/23 EF 58%  subendocardial (25-50% wall thickness) LGE mid-apical, anteroseptal segment and basal-mid inferolateral segment. RV ok mild MR  Seen in [facility] 07/25 where jardiance  10mg  daily was started.   She presents today for a HF follow-up visit with a chief complaint of fatigue. Denies shortness of breath, chest pain, palpitations, abdominal distention, pedal edema or dizziness. 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 No issues with jardiance  that she's aware of.   Prior notes Left heart cath 05/2016 patent LIMA to LAD, patent SVG  to OM, patent SVG to PDA, occluded SVG to PL. Echo 05/2016, EF 55 to 60%, basal inferior hypokinesis, impaired relaxation.   ROS: All systems negative except what is listed in HPI, PMH and Problem List   Past Medical History:  Diagnosis Date   A-fib (HCC)    Anemia    Anginal pain (HCC)    Anxiety    Arthritis    CAD (coronary artery disease)    Cancer (HCC)    Carpal tunnel syndrome    bilateral   Cataract    bil removed cataracts   Coronary artery disease    2x stents, Dr. Florencio   Depression    Dysrhythmia    Family history of adverse reaction to anesthesia    brother; S/P lipotripsy in [place]; transferred to [facility]; had to put him on life support for 2 days   GERD (gastroesophageal reflux disease)    Headache    weekly (09/13/2014)   History of blood transfusion 05/2014   we haven't figured out why I needed it yet (09/13/2014)   History of hiatal hernia    Hyperglycemia    Hyperlipidemia    Hypertension    Kidney stone    Myocardial infarct [facility])    Myocardial infarct, old 1999   15 years ago   Obesity    PONV (postoperative nausea and vomiting)    Renal mass    S/P CABG x  4 09/18/2014   LIMA to LAD, SVG to PDA-dRCA sequentially, SVG to OM, EVH via right thigh    Sleep apnea    Sleep apnea    don't use a mask (09/13/2014)   Unstable angina (HCC)     Current Outpatient Medications  Medication Sig Dispense Refill   acetaminophen  (TYLENOL ) 500 MG tablet Take 500 mg by mouth every 6 (six) hours as needed for headache.     amiodarone  (PACERONE ) 100 MG tablet Take 1 tablet (100 mg total) by mouth daily. 90 tablet 3   amLODipine  (NORVASC ) 10 MG tablet Take 1 tablet (10 mg total) by mouth daily. 30 tablet 6   apixaban  (ELIQUIS ) 5 MG TABS tablet Take 1 tablet (5 mg total) by mouth 2 (two) times daily. 60 tablet 5   aspirin  EC 81 MG tablet Take 1 tablet (81 mg total) by mouth daily.     Blood Pressure Monitoring (BLOOD PRESSURE KIT) DEVI Check  blood pressure daily, brand at patient and insurance preference, dx code I10 1 each 0   Docusate Sodium  (DSS) 100 MG CAPS Take 1 capsule by mouth daily at 6 (six) AM.     ezetimibe  (ZETIA ) 10 MG tablet Take 1 tablet by mouth once daily 90 tablet 0   gentamicin ointment (GARAMYCIN) 0.1 % Apply 1 Application topically as needed (to nose as needed).     isosorbide  mononitrate (IMDUR ) 30 MG 24 hr tablet Take 1 tablet by mouth once daily 90 tablet 0   JARDIANCE  10 MG TABS tablet Take 1 tablet (10 mg total) by mouth daily before breakfast. 30 tablet 11   metoprolol  succinate (TOPROL -XL) 50 MG 24 hr tablet TAKE 1 TABLET BY MOUTH ONCE DAILY . APPOINTMENT REQUIRED FOR FUTURE REFILLS 45 tablet 0   nitroGLYCERIN  (NITROSTAT ) 0.4 MG SL tablet Place 1 tablet (0.4 mg total) under the tongue every 5 (five) minutes as needed for chest pain. Take for up to 3 doses and then call 911 if still having chest pain. 30 tablet 0   pantoprazole  (PROTONIX ) 40 MG tablet Take 1 tablet by mouth once daily 90 tablet 0   REPATHA  SURECLICK 140 MG/ML SOAJ INJECT 140 MG INTO THE SKIN EVERY 14 DAYS 2 mL 0   rosuvastatin  (CRESTOR ) 40 MG tablet Take 1 tablet (40 mg total) by mouth daily. 90 tablet 3   sacubitril-valsartan (ENTRESTO ) 49-51 MG Take 1 tablet by mouth 2 (two) times daily. 180 tablet 3   Sodium Sulfate-Mag Sulfate-KCl (SUTAB ) [phone] MG TABS Take 1 kit by mouth as directed. Patient to bring in coupon 24 tablet 0   No current facility-administered medications for this visit.    Allergies  Allergen Reactions   Ferrous Fumarate Other (See Comments)    STOMACH ISSUES STOMACH ISSUES STOMACH ISSUES   Lipitor  [Atorvastatin ] Other (See Comments)    Myalgia      Social History   Socioeconomic History   Marital status: Married    Spouse name: Not on file   Number of children: Not on file   Years of education: Not on file   Highest education level: Not on file  Occupational History   Not on file  Tobacco Use    Smoking status: Never    Passive exposure: Never   Smokeless tobacco: Never  Vaping Use   Vaping status: Never Used  Substance and Sexual Activity   Alcohol use: Not Currently    Alcohol/week: 0.0 standard drinks of alcohol   Drug use: No   Sexual  activity: Not Currently    Birth control/protection: Post-menopausal  Other Topics Concern   Not on file  Social History Narrative   ** Merged History Encounter **       Lives in [place] with 10 YO nephew and husband. 3 dogs outside.  Work - Therapist, sports, [employer]  Diet - regular, limited meat  Exercise - walks some   Social Drivers of Corporate investment banker Strain: Low Risk  (03/26/2023)   Overall Financial Resource Strain (CARDIA)    Difficulty of Paying Living Expenses: Not hard at all  Food Insecurity: No Food Insecurity (04/30/2023)   Hunger Vital Sign    Worried About Running Out of Food in the Last Year: Never true    Ran Out of Food in the Last Year: Never true  Transportation Needs: No Transportation Needs (04/30/2023)   PRAPARE - Administrator, Civil Service (Medical): No    Lack of Transportation (Non-Medical): No  Physical Activity: Insufficiently Active (03/26/2023)   Exercise Vital Sign    Days of Exercise per Week: 2 days    Minutes of Exercise per Session: 30 min  Stress: No Stress Concern Present (03/26/2023)   [employer] of Occupational Health - Occupational Stress Questionnaire    Feeling of Stress : Not at all  Social Connections: Socially Integrated (03/26/2023)   Social Connection and Isolation Panel    Frequency of Communication with Friends and Family: More than three times a week    Frequency of Social Gatherings with Friends and Family: Never    Attends Religious Services: More than 4 times per year    Active Member of [employer] or Organizations: Yes    Attends Banker Meetings: Never    Marital Status: Married  Catering manager Violence: Not At Risk  (04/30/2023)   Humiliation, Afraid, Rape, and Kick questionnaire    Fear of Current or Ex-Partner: No    Emotionally Abused: No    Physically Abused: No    Sexually Abused: No      Family History  Problem Relation Age of Onset   Coronary artery disease Mother    Osteoporosis Mother    Heart disease Mother    Stroke Mother    Heart attack Mother    Hypertension Mother    Hypertension Father    Coronary artery disease Father    COPD Father    Heart disease Father    Heart attack Father    Glaucoma Father    Osteoporosis Father    Emphysema Father    Cancer Sister 18       colon   Hypertension Sister    Colon cancer Sister        dx in her 27's   Breast cancer Sister    Colon polyps Sister    Stroke Brother    Hypertension Brother    Colon polyps Brother    Stroke Maternal Grandfather    Stroke Maternal Grandmother    Osteoporosis Maternal Grandmother    Hypertension Maternal Grandmother    Hypertension Paternal Grandmother    Hypertension Paternal Grandfather    Breast cancer Maternal Aunt        2 aunts-50's   Esophageal cancer Neg Hx    Stomach cancer Neg Hx    Rectal cancer Neg Hx    Pancreatic cancer Neg Hx    Vitals:   12/31/23 0847  BP: (!) 145/78  Pulse: 72  SpO2: 98%  Weight: 191 lb  9.6 oz (86.9 kg)   Wt Readings from Last 3 Encounters:  12/31/23 191 lb 9.6 oz (86.9 kg)  11/25/23 195 lb 12.8 oz (88.8 kg)  10/29/23 189 lb (85.7 kg)   Lab Results  Component Value Date   CREATININE 1.61 (H) 10/29/2023   CREATININE 1.64 (H) 07/09/2023   CREATININE 1.47 (H) 07/05/2023    PHYSICAL EXAM:  General: Well appearing.  Cor: No JVD. Regular rhythm, rate.  Lungs: clear Abdomen: soft, nontender, nondistended. Extremities: no edema Neuro:. Affect pleasant   ASSESSMENT & PLAN:  1. Chronic heart failure with reduced ejection fraction- - I continue to suspect the main driver here is severe untreated OSA +/- frequent PVCs  - NYHA class - euvolemic -  However HST 1/25 was negative. (Noted heavy snoring but AHI < 1.0)  - Had in-lab sleep study 06/25 - weight down 4 pounds from last visit here 1 month ago - Continue entresto  49/51mg  BID - Continue jardiance  10mg  daily  - BMET today and pending results may start spironolactone and stop amlodipine  - if spiro is started, will get BMET next week and at next OV - BNP 10/29/23 was 317.1  2. OSA, untreated - unable to tolerate CPAP in past but that was years ago - HST 2/25: AHI 0.7  Moderate to severe snoring sats down to 92%  - sleep study done 06/25  3. RV dysfunction - suspect OSA/OHS. - Echo 02/25 EF 45-50% in setting of AF with RVR. RV ok  - cMRI 5/25 RV normal  - continue entresto  and jardiance  - wear compression socks daily with removal at bedtime  4. Frequent PVCs/SVT - Zio 1/25: Sinus rhythm. 1% AF 243 runs SVT PVCs 14.5%  - now on amio for AF. PVCs suppressed - continue amio for now. Eventually wean to mexilitene  5. PAF - regular today.continue amio for now.  - continue Eliquis  5 bid. No bleeding  6. CAD s/p CABG - no s/s angina - continue Eliquis /statin/zetia  - cMRI 10/20/23 EF 58%  subendocardial (25-50% wall thickness) LGE mid-apical, anteroseptal segment and basal-mid inferolateral segment. RV ok mild MR  7. CKD 3b  - baseline Scr 1.6 in setting of solitary kidney with h/o R nephrectomy due to RCC - Continue entresto  - Continue jardiance  10mg  daily - saw nephrology Geoffry) 07/25  8. Obesity - BMI 35.04 kg/ m2 - unable to get GLP1 approved by insurance  9. HTN - BP 145/78 - to see PCP Ester) in 2 months - BMET 12/09/23 reviewed: sodium 139, potassium 4.8, creatinine 1.76 & GFR 30 - BMET today   Return later next month for already scheduled appointment, sooner if needed.   Ellouise DELENA Class, FNP  12/30/23

## 2023-12-31 ENCOUNTER — Ambulatory Visit: Admitting: Family

## 2023-12-31 ENCOUNTER — Other Ambulatory Visit
Admission: RE | Admit: 2023-12-31 | Discharge: 2023-12-31 | Disposition: A | Discharge status: Home / Self Care | Source: Ambulatory Visit | Attending: Family | Admitting: Family

## 2023-12-31 ENCOUNTER — Ambulatory Visit: Payer: Self-pay | Admitting: Family

## 2023-12-31 ENCOUNTER — Other Ambulatory Visit: Payer: Self-pay | Admitting: Family

## 2023-12-31 ENCOUNTER — Encounter: Payer: Self-pay | Admitting: Family

## 2023-12-31 VITALS — BP 145/78 | HR 72 | Wt 191.6 lb

## 2023-12-31 DIAGNOSIS — Z6835 Body mass index (BMI) 35.0-35.9, adult: Secondary | ICD-10-CM | POA: Insufficient documentation

## 2023-12-31 DIAGNOSIS — I471 Supraventricular tachycardia, unspecified: Secondary | ICD-10-CM | POA: Insufficient documentation

## 2023-12-31 DIAGNOSIS — I2510 Atherosclerotic heart disease of native coronary artery without angina pectoris: Secondary | ICD-10-CM

## 2023-12-31 DIAGNOSIS — Z951 Presence of aortocoronary bypass graft: Secondary | ICD-10-CM | POA: Diagnosis not present

## 2023-12-31 DIAGNOSIS — I25119 Atherosclerotic heart disease of native coronary artery with unspecified angina pectoris: Secondary | ICD-10-CM | POA: Insufficient documentation

## 2023-12-31 DIAGNOSIS — I130 Hypertensive heart and chronic kidney disease with heart failure and stage 1 through stage 4 chronic kidney disease, or unspecified chronic kidney disease: Secondary | ICD-10-CM | POA: Insufficient documentation

## 2023-12-31 DIAGNOSIS — I480 Paroxysmal atrial fibrillation: Secondary | ICD-10-CM | POA: Diagnosis not present

## 2023-12-31 DIAGNOSIS — I5022 Chronic systolic (congestive) heart failure: Secondary | ICD-10-CM | POA: Diagnosis not present

## 2023-12-31 DIAGNOSIS — Z905 Acquired absence of kidney: Secondary | ICD-10-CM | POA: Insufficient documentation

## 2023-12-31 DIAGNOSIS — N1832 Chronic kidney disease, stage 3b: Secondary | ICD-10-CM | POA: Insufficient documentation

## 2023-12-31 DIAGNOSIS — G4733 Obstructive sleep apnea (adult) (pediatric): Secondary | ICD-10-CM | POA: Diagnosis not present

## 2023-12-31 DIAGNOSIS — I252 Old myocardial infarction: Secondary | ICD-10-CM | POA: Insufficient documentation

## 2023-12-31 DIAGNOSIS — E669 Obesity, unspecified: Secondary | ICD-10-CM | POA: Insufficient documentation

## 2023-12-31 DIAGNOSIS — E785 Hyperlipidemia, unspecified: Secondary | ICD-10-CM | POA: Insufficient documentation

## 2023-12-31 DIAGNOSIS — I493 Ventricular premature depolarization: Secondary | ICD-10-CM | POA: Insufficient documentation

## 2023-12-31 DIAGNOSIS — Z7901 Long term (current) use of anticoagulants: Secondary | ICD-10-CM | POA: Insufficient documentation

## 2023-12-31 DIAGNOSIS — Z85528 Personal history of other malignant neoplasm of kidney: Secondary | ICD-10-CM | POA: Insufficient documentation

## 2023-12-31 DIAGNOSIS — Z862 Personal history of diseases of the blood and blood-forming organs and certain disorders involving the immune mechanism: Secondary | ICD-10-CM | POA: Insufficient documentation

## 2023-12-31 DIAGNOSIS — Z79899 Other long term (current) drug therapy: Secondary | ICD-10-CM | POA: Insufficient documentation

## 2023-12-31 LAB — BASIC METABOLIC PANEL WITH GFR
Anion gap: 9 (ref 5–15)
BUN: 34 mg/dL — ABNORMAL HIGH (ref 8–23)
CO2: 24 mmol/L (ref 22–32)
Calcium: 9 mg/dL (ref 8.9–10.3)
Chloride: 110 mmol/L (ref 98–111)
Creatinine, Ser: 1.82 mg/dL — ABNORMAL HIGH (ref 0.44–1.00)
GFR, Estimated: 28 mL/min — ABNORMAL LOW (ref >60)
Glucose, Bld: 100 mg/dL — ABNORMAL HIGH (ref 70–99)
Potassium: 5.3 mmol/L — ABNORMAL HIGH (ref 3.5–5.1)
Sodium: 143 mmol/L (ref 135–145)

## 2023-12-31 NOTE — Telephone Encounter (Signed)
 Pharmacy Patient Advocate Encounter  Received notification from [facility] [employer] that Prior Authorization for Repatha  SureClick 140MG /ML auto-injectors has been APPROVED from 12/30/23 to 12/29/24   PA #/Case ID/Reference #: [id number]

## 2023-12-31 NOTE — Patient Instructions (Addendum)
 Medication Changes:  No medication changes.   Lab Work:  Go over to the [facility]ICAL MALL. Go pass the gift shop and have your blood work completed.  We will only call you if the results are abnormal or if the provider would like to make medication changes.  No news is good news.   Thank you for choosing [facility].    At the [facility], you and your health needs are our priority. We have a designated team specialized in the treatment of Heart Failure. This Care Team includes your primary Heart Failure Specialized Cardiologist (physician), Advanced Practice Providers (APPs- Physician Assistants and Nurse Practitioners), and Pharmacist who all work together to provide you with the care you need, when you need it.   You may see any of the following providers on your designated Care Team at your next follow up:  Dr. Toribio Fuel Dr. Ezra Shuck Dr. Ria Commander Dr. Morene Brownie Ellouise Class, FNP Jaun Bash, RPH-CPP  Please be sure to bring in all your medications bottles to every appointment.   Need to Contact Us :  If you have any questions or concerns before your next appointment please send us  a message through [place] or call our office at [phone].    TO LEAVE A MESSAGE FOR THE NURSE SELECT OPTION 2, PLEASE LEAVE A MESSAGE INCLUDING: YOUR NAME DATE OF BIRTH CALL BACK NUMBER REASON FOR CALL**this is important as we prioritize the call backs  YOU WILL RECEIVE A CALL BACK THE SAME DAY AS LONG AS YOU CALL BEFORE 4:00 PM

## 2024-01-07 ENCOUNTER — Ambulatory Visit: Payer: Self-pay | Admitting: Family

## 2024-01-07 ENCOUNTER — Other Ambulatory Visit
Admission: RE | Admit: 2024-01-07 | Discharge: 2024-01-07 | Disposition: A | Discharge status: Home / Self Care | Source: Ambulatory Visit | Attending: Family | Admitting: Family

## 2024-01-07 DIAGNOSIS — I5022 Chronic systolic (congestive) heart failure: Secondary | ICD-10-CM | POA: Diagnosis present

## 2024-01-07 LAB — BASIC METABOLIC PANEL WITH GFR
Anion gap: 10 (ref 5–15)
BUN: 31 mg/dL — ABNORMAL HIGH (ref 8–23)
CO2: 22 mmol/L (ref 22–32)
Calcium: 8.9 mg/dL (ref 8.9–10.3)
Chloride: 109 mmol/L (ref 98–111)
Creatinine, Ser: 1.91 mg/dL — ABNORMAL HIGH (ref 0.44–1.00)
GFR, Estimated: 27 mL/min — ABNORMAL LOW (ref >60)
Glucose, Bld: 106 mg/dL — ABNORMAL HIGH (ref 70–99)
Potassium: 4.8 mmol/L (ref 3.5–5.1)
Sodium: 141 mmol/L (ref 135–145)

## 2024-01-14 ENCOUNTER — Inpatient Hospital Stay: Payer: HMO

## 2024-01-14 ENCOUNTER — Inpatient Hospital Stay: Attending: Oncology

## 2024-01-14 DIAGNOSIS — Z905 Acquired absence of kidney: Secondary | ICD-10-CM | POA: Diagnosis not present

## 2024-01-14 DIAGNOSIS — D631 Anemia in chronic kidney disease: Secondary | ICD-10-CM | POA: Diagnosis not present

## 2024-01-14 DIAGNOSIS — Z79899 Other long term (current) drug therapy: Secondary | ICD-10-CM | POA: Insufficient documentation

## 2024-01-14 DIAGNOSIS — Z7982 Long term (current) use of aspirin: Secondary | ICD-10-CM | POA: Diagnosis not present

## 2024-01-14 DIAGNOSIS — Z7901 Long term (current) use of anticoagulants: Secondary | ICD-10-CM | POA: Diagnosis not present

## 2024-01-14 DIAGNOSIS — I129 Hypertensive chronic kidney disease with stage 1 through stage 4 chronic kidney disease, or unspecified chronic kidney disease: Secondary | ICD-10-CM | POA: Insufficient documentation

## 2024-01-14 DIAGNOSIS — N2889 Other specified disorders of kidney and ureter: Secondary | ICD-10-CM | POA: Insufficient documentation

## 2024-01-14 DIAGNOSIS — N1832 Chronic kidney disease, stage 3b: Secondary | ICD-10-CM | POA: Diagnosis not present

## 2024-01-14 DIAGNOSIS — D509 Iron deficiency anemia, unspecified: Secondary | ICD-10-CM | POA: Insufficient documentation

## 2024-01-14 DIAGNOSIS — Z85528 Personal history of other malignant neoplasm of kidney: Secondary | ICD-10-CM | POA: Insufficient documentation

## 2024-01-14 LAB — CBC WITH DIFFERENTIAL/PLATELET
Abs Immature Granulocytes: 0.01 K/uL (ref 0.00–0.07)
Basophils Absolute: 0 K/uL (ref 0.0–0.1)
Basophils Relative: 0 %
Eosinophils Absolute: 0.2 K/uL (ref 0.0–0.5)
Eosinophils Relative: 4 %
HCT: 41.3 % (ref 36.0–46.0)
Hemoglobin: 13.2 g/dL (ref 12.0–15.0)
Immature Granulocytes: 0 %
Lymphocytes Relative: 29 %
Lymphs Abs: 1.4 K/uL (ref 0.7–4.0)
MCH: 30 pg (ref 26.0–34.0)
MCHC: 32 g/dL (ref 30.0–36.0)
MCV: 93.9 fL (ref 80.0–100.0)
Monocytes Absolute: 0.8 K/uL (ref 0.1–1.0)
Monocytes Relative: 16 %
Neutro Abs: 2.5 K/uL (ref 1.7–7.7)
Neutrophils Relative %: 51 %
Platelets: 194 K/uL (ref 150–400)
RBC: 4.4 MIL/uL (ref 3.87–5.11)
RDW: 13.2 % (ref 11.5–15.5)
WBC: 5 K/uL (ref 4.0–10.5)
nRBC: 0 % (ref 0.0–0.2)

## 2024-01-14 LAB — IRON AND TIBC
Iron: 48 ug/dL (ref 28–170)
Saturation Ratios: 11 % (ref 10.4–31.8)
TIBC: 449 ug/dL (ref 250–450)
UIBC: 401 ug/dL

## 2024-01-14 LAB — FERRITIN: Ferritin: 9 ng/mL — ABNORMAL LOW (ref 11–307)

## 2024-01-17 ENCOUNTER — Telehealth: Payer: Self-pay | Admitting: Nurse Practitioner

## 2024-01-17 NOTE — Telephone Encounter (Signed)
 Pt called and requested to change her visit for tomorrow to a virtual visit. Requested that the infusion (if still needed), would be moved to Wednesday. Changed appt type for MD visit, but left the infusion for now - pt thinks it will be canceled. Left note on infusion encounter - LH

## 2024-01-18 ENCOUNTER — Inpatient Hospital Stay

## 2024-01-18 ENCOUNTER — Other Ambulatory Visit: Payer: Self-pay | Admitting: *Deleted

## 2024-01-18 ENCOUNTER — Inpatient Hospital Stay (HOSPITAL_BASED_OUTPATIENT_CLINIC_OR_DEPARTMENT_OTHER): Admitting: Nurse Practitioner

## 2024-01-18 DIAGNOSIS — D509 Iron deficiency anemia, unspecified: Secondary | ICD-10-CM | POA: Diagnosis not present

## 2024-01-18 DIAGNOSIS — D508 Other iron deficiency anemias: Secondary | ICD-10-CM

## 2024-01-18 DIAGNOSIS — D631 Anemia in chronic kidney disease: Secondary | ICD-10-CM

## 2024-01-18 DIAGNOSIS — N1832 Chronic kidney disease, stage 3b: Secondary | ICD-10-CM | POA: Diagnosis not present

## 2024-01-18 NOTE — Progress Notes (Signed)
 Attempted to call patient 3 times to complete pre-video visit paperwork.  No answer.  Left message to remind patient of her video visit with Tinnie Dawn at 10:00.  Patient returned my call.  Pre-assessment complete.

## 2024-01-18 NOTE — Progress Notes (Signed)
 [facility]  [phone] [phone]  Virtual Visit Progress Note  I connected with Ronal Loa Marina on 01/18/24 at 10:00 AM EDT by video enabled telemedicine visit and verified that I am speaking with the correct person using two identifiers.   I discussed the limitations, risks, security and privacy concerns of performing an evaluation and management service by telemedicine and the availability of in-person appointments. I also discussed with the patient that there may be a patient responsible charge related to this service. The patient expressed understanding and agreed to proceed.   Other persons participating in the visit and their role in the encounter: none   Patient's location: work Provider's location: clinic   ID: Shaely Gadberry OB: 1946-02-13  MR#: [id number]  RDW#:[id number]  Patient Care Team: Patient, No Pcp Per as PCP - General (General Practice) Darliss Rogue, MD as PCP - Cardiology (Cardiology) Vannie Delon LABOR, MD (Internal Medicine) Dusty Sudie DEL, MD (Inactive) as Consulting Physician (Cardiothoracic Surgery) Blanca Elsie RAMAN, MD as Consulting Physician (Cardiology) Jacobo Evalene PARAS, MD as Consulting Physician (Oncology)  CHIEF COMPLAINT: Iron  deficiency anemia, right renal mass.  INTERVAL HISTORY: Debbie Delacruz is a 78 y.o. female with history of iron  deficiency anemia who agrees to evaluation via telemedicine for follow up of her iron  deficiency anemia. She last received IV feraheme  February 2025. She feels increasingly fatigued. Denies black or bloody stools. Is unaware of any blood loss. Denies any neurologic complaints. Denies recent fevers or illnesses. Denies any easy bleeding or bruising. No melena or hematochezia. No pica or restless leg. Reports good appetite and denies weight loss. Denies chest pain. Denies any nausea, vomiting, constipation, or diarrhea. Denies urinary complaints. Patient  offers no further specific complaints today. She has continues to work.   REVIEW OF SYSTEMS:   Review of Systems  Constitutional:  Positive for malaise/fatigue. Negative for fever and weight loss.  Respiratory:  Negative for cough and shortness of breath.   Cardiovascular:  Negative for chest pain and leg swelling.  Gastrointestinal:  Negative for abdominal pain, blood in stool and melena.  Genitourinary:  Negative for hematuria.  Musculoskeletal:  Negative for joint pain.  Skin:  Negative for rash.  Neurological:  Negative for dizziness, sensory change, focal weakness, weakness and headaches.  Psychiatric/Behavioral:  Negative for depression. The patient is not nervous/anxious and does not have insomnia.   As per HPI. Otherwise, a complete review of systems is negative.  PAST MEDICAL HISTORY: Past Medical History:  Diagnosis Date   A-fib (HCC)    Anemia    Anginal pain (HCC)    Anxiety    Arthritis    CAD (coronary artery disease)    Cancer (HCC)    Carpal tunnel syndrome    bilateral   Cataract    bil removed cataracts   Coronary artery disease    2x stents, Dr. Florencio   Depression    Dysrhythmia    Family history of adverse reaction to anesthesia    brother; S/P lipotripsy in [place]; transferred to [facility]; had to put him on life support for 2 days   GERD (gastroesophageal reflux disease)    Headache    weekly (09/13/2014)   History of blood transfusion 05/2014   we haven't figured out why I needed it yet (09/13/2014)   History of hiatal hernia    Hyperglycemia    Hyperlipidemia    Hypertension    Kidney stone    Myocardial infarct (HCC)  Myocardial infarct, old 1999   15 years ago   Obesity    PONV (postoperative nausea and vomiting)    Renal mass    S/P CABG x 4 09/18/2014   LIMA to LAD, SVG to PDA-dRCA sequentially, SVG to OM, EVH via right thigh    Sleep apnea    Sleep apnea    don't use a mask (09/13/2014)   Unstable angina (HCC)      PAST SURGICAL HISTORY: Past Surgical History:  Procedure Laterality Date   ABDOMINAL HYSTERECTOMY     APPENDECTOMY     BACK SURGERY     BLADDER SURGERY     CARDIAC CATHETERIZATION  06/2014   CARDIAC CATHETERIZATION N/A 06/16/2016   Procedure: Left Heart Cath and Cors/Grafts Angiography;  Surgeon: Lonni JONETTA Cash, MD;  Location: [facility] INVASIVE CV LAB;  Service: Cardiovascular;  Laterality: N/A;   CARPAL TUNNEL RELEASE Left    CATARACT EXTRACTION W/ INTRAOCULAR LENS  IMPLANT, BILATERAL     CHOLECYSTECTOMY     COLONOSCOPY  2022   MS-MAC-prep good-tics/multiple frag of TA-1 yr recall   CORONARY ANGIOPLASTY WITH STENT PLACEMENT  1999   [facility] x2 stent   CORONARY ARTERY BYPASS GRAFT N/A 09/18/2014   Procedure: CORONARY ARTERY BYPASS GRAFTING (CABG)times four using LIMA  to LAD:SVG to  PD and DIST RCA;SVG to OM;, EVH Right thigh;  Surgeon: Sudie VEAR Laine, MD;  Location: [facility] OR;  Service: Open Heart Surgery;  Laterality: N/A;   CYSTOSCOPY W/ RETROGRADES Left 03/15/2020   Procedure: CYSTOSCOPY WITH RETROGRADE PYELOGRAM;  Surgeon: Francisca Redell BROCKS, MD;  Location: [facility] ORS;  Service: Urology;  Laterality: Left;   CYSTOSCOPY WITH STENT PLACEMENT Left 02/20/2020   Procedure: CYSTOSCOPY WITH STENT PLACEMENT;  Surgeon: Francisca Redell BROCKS, MD;  Location: [facility] ORS;  Service: Urology;  Laterality: Left;   CYSTOSCOPY/URETEROSCOPY/HOLMIUM LASER/STENT PLACEMENT Left 03/15/2020   Procedure: CYSTOSCOPY/URETEROSCOPY;  Surgeon: Francisca Redell BROCKS, MD;  Location: [facility] ORS;  Service: Urology;  Laterality: Left;   ESOPHAGOGASTRODUODENOSCOPY N/A 09/17/2014   Procedure: ESOPHAGOGASTRODUODENOSCOPY (EGD);  Surgeon: Lamar JONETTA Aho, MD;  Location: [facility] ENDOSCOPY;  Service: Endoscopy;  Laterality: N/A;   heart stents     1999   INCONTINENCE SURGERY     INGUINAL HERNIA REPAIR     INGUINAL HERNIA REPAIR Right X 3   last one was in the 1990's; still have hernia there now (09/13/2014)   LAPAROSCOPIC CHOLECYSTECTOMY      LUMBAR DISC SURGERY  1990's?   LUMBAR SPINE SURGERY     NEPHRECTOMY Right    removal of ovaries     TEE WITHOUT CARDIOVERSION N/A 09/18/2014   Procedure: TRANSESOPHAGEAL ECHOCARDIOGRAM (TEE);  Surgeon: Sudie VEAR Laine, MD;  Location: [facility] OR;  Service: Open Heart Surgery;  Laterality: N/A;   UPPER GASTROINTESTINAL ENDOSCOPY      FAMILY HISTORY: Family History  Problem Relation Age of Onset   Coronary artery disease Mother    Osteoporosis Mother    Heart disease Mother    Stroke Mother    Heart attack Mother    Hypertension Mother    Hypertension Father    Coronary artery disease Father    COPD Father    Heart disease Father    Heart attack Father    Glaucoma Father    Osteoporosis Father    Emphysema Father    Cancer Sister 29       colon   Hypertension Sister    Colon cancer Sister  dx in her 98's   Breast cancer Sister    Colon polyps Sister    Stroke Brother    Hypertension Brother    Colon polyps Brother    Stroke Maternal Grandfather    Stroke Maternal Grandmother    Osteoporosis Maternal Grandmother    Hypertension Maternal Grandmother    Hypertension Paternal Grandmother    Hypertension Paternal Grandfather    Breast cancer Maternal Aunt        2 aunts-50's   Esophageal cancer Neg Hx    Stomach cancer Neg Hx    Rectal cancer Neg Hx    Pancreatic cancer Neg Hx    ADVANCED DIRECTIVES (Y/N):  N  HEALTH MAINTENANCE: Social History   Tobacco Use   Smoking status: Never    Passive exposure: Never   Smokeless tobacco: Never  Vaping Use   Vaping status: Never Used  Substance Use Topics   Alcohol use: Not Currently    Alcohol/week: 0.0 standard drinks of alcohol   Drug use: No    Colonoscopy:  PAP:  Bone density:  Lipid panel:  Allergies  Allergen Reactions   Ferrous Fumarate Other (See Comments)    STOMACH ISSUES STOMACH ISSUES STOMACH ISSUES   Lipitor  [Atorvastatin ] Other (See Comments)    Myalgia    Current Outpatient Medications   Medication Sig Dispense Refill   acetaminophen  (TYLENOL ) 500 MG tablet Take 500 mg by mouth every 6 (six) hours as needed for headache.     amiodarone  (PACERONE ) 100 MG tablet Take 1 tablet (100 mg total) by mouth daily. 90 tablet 3   amLODipine  (NORVASC ) 10 MG tablet Take 1 tablet (10 mg total) by mouth daily. 30 tablet 6   apixaban  (ELIQUIS ) 5 MG TABS tablet Take 1 tablet (5 mg total) by mouth 2 (two) times daily. 60 tablet 5   aspirin  EC 81 MG tablet Take 1 tablet (81 mg total) by mouth daily.     Blood Pressure Monitoring (BLOOD PRESSURE KIT) DEVI Check blood pressure daily, brand at patient and insurance preference, dx code I10 1 each 0   Docusate Sodium  (DSS) 100 MG CAPS Take 1 capsule by mouth daily at 6 (six) AM.     ezetimibe  (ZETIA ) 10 MG tablet Take 1 tablet by mouth once daily 90 tablet 0   gentamicin ointment (GARAMYCIN) 0.1 % Apply 1 Application topically as needed (to nose as needed).     isosorbide  mononitrate (IMDUR ) 30 MG 24 hr tablet Take 1 tablet by mouth once daily 90 tablet 0   JARDIANCE  10 MG TABS tablet Take 1 tablet (10 mg total) by mouth daily before breakfast. 30 tablet 11   metoprolol  succinate (TOPROL -XL) 50 MG 24 hr tablet TAKE 1 TABLET BY MOUTH ONCE DAILY . APPOINTMENT REQUIRED FOR FUTURE REFILLS 45 tablet 0   nitroGLYCERIN  (NITROSTAT ) 0.4 MG SL tablet Place 1 tablet (0.4 mg total) under the tongue every 5 (five) minutes as needed for chest pain. Take for up to 3 doses and then call 911 if still having chest pain. 30 tablet 0   REPATHA  SURECLICK 140 MG/ML SOAJ INJECT 140 MG INTO THE SKIN EVERY 14 DAYS 2 mL 0   rosuvastatin  (CRESTOR ) 40 MG tablet Take 1 tablet (40 mg total) by mouth daily. 90 tablet 3   sacubitril-valsartan (ENTRESTO ) 49-51 MG Take 1 tablet by mouth 2 (two) times daily. 180 tablet 3   pantoprazole  (PROTONIX ) 40 MG tablet Take 1 tablet by mouth once daily (Patient not taking: Reported on  01/18/2024) 90 tablet 0   Sodium Sulfate-Mag Sulfate-KCl (SUTAB )  [phone] MG TABS Take 1 kit by mouth as directed. Patient to bring in coupon (Patient not taking: Reported on 01/18/2024) 24 tablet 0   No current facility-administered medications for this visit.   OBJECTIVE: There were no vitals filed for this visit. There is no height or weight on file to calculate BMI.      ECOG FS:1 - Symptomatic but completely ambulatory  Physical Exam Constitutional:      Appearance: She is not ill-appearing.  Pulmonary:     Effort: Pulmonary effort is normal.  Skin:    Coloration: Skin is not pale.  Neurological:     Mental Status: She is alert and oriented to person, place, and time.  Psychiatric:        Mood and Affect: Mood normal.        Behavior: Behavior normal.    LAB RESULTS: Lab Results  Component Value Date   NA 141 01/07/2024   K 4.8 01/07/2024   CL 109 01/07/2024   CO2 22 01/07/2024   GLUCOSE 106 (H) 01/07/2024   BUN 31 (H) 01/07/2024   CREATININE 1.91 (H) 01/07/2024   CALCIUM  8.9 01/07/2024   PROT 7.3 10/29/2023   ALBUMIN  4.1 10/29/2023   AST 18 10/29/2023   ALT 14 10/29/2023   ALKPHOS 47 10/29/2023   BILITOT 0.7 10/29/2023   GFRNONAA 27 (L) 01/07/2024   GFRAA >60 02/24/2020   Lab Results  Component Value Date   WBC 5.0 01/14/2024   NEUTROABS 2.5 01/14/2024   HGB 13.2 01/14/2024   HCT 41.3 01/14/2024   MCV 93.9 01/14/2024   PLT 194 01/14/2024   Lab Results  Component Value Date   IRON  48 01/14/2024   TIBC 449 01/14/2024   IRONPCTSAT 11 01/14/2024   Lab Results  Component Value Date   FERRITIN 9 (L) 01/14/2024   STUDIES: No results found.  ASSESSMENT: Iron  deficiency anemia  PLAN:    1. Iron  deficiency anemia: Etiology unclear- likely multifactorial- CKD vs bleeding vs malabsorption (hernia) vs others. Endoscopy and colonoscopy have been negative for GI bleed. Colonoscopy 06/02/22 did reveal diverticula w/o evidence of bleeding and moderate grade I hemorrhoids. Endoscopy 01/14/21 revealed medium hiatal hernia  and erosions w/o evidence of bleeding. Last received IV feraheme  x 2 in February 2025. Tolerated well. Today, hmg 13.4, however ferritin has dropped to 9, iron  sat 11%. Symptomatic. Plan for feraheme  x 2.   2.  Right renal cell carcinoma: Patient underwent partial nephrectomy on 07/04/2020 at [facility] revealing stage I malignancy.  Followed by [facility] for surveillance of RCC however, she has not been seen recently. Recommend she reach out to [facility] for follow up visit.   3. CKD- stage 3b- managed by Dr. Marcelino. Gfr 32. Reviewed role of CKD in anemia. No role for EPO currently. Continue to optimize iron  stores.   Disposition:  Feraheme  x 2 (prefers Friday) 6 mo- lab (cbc, ferritin, iron  studies) Day to week later see Dr. Jacobo for virtual visit- la  I discussed the assessment and treatment plan with the patient. The patient was provided an opportunity to ask questions and all were answered. The patient agreed with the plan and demonstrated an understanding of the instructions.   The patient was advised to call back or seek an in-person evaluation if the symptoms worsen or if the condition fails to improve as anticipated.   I spent 15 minutes face-to-face video visit time dedicated to the care  of this patient on the date of this encounter to include pre-visit review of labs, prior notes, face-to-face time with the patient, and post visit ordering of testing/documentation.   Tinnie KANDICE Dawn, NP    01/18/2024

## 2024-01-21 ENCOUNTER — Ambulatory Visit: Payer: HMO | Admitting: Nurse Practitioner

## 2024-01-21 ENCOUNTER — Inpatient Hospital Stay

## 2024-01-21 ENCOUNTER — Ambulatory Visit: Payer: HMO

## 2024-01-21 VITALS — BP 126/70 | HR 78 | Temp 98.4°F | Resp 16

## 2024-01-21 DIAGNOSIS — D509 Iron deficiency anemia, unspecified: Secondary | ICD-10-CM

## 2024-01-21 MED ORDER — SODIUM CHLORIDE 0.9 % IV SOLN
510.0000 mg | Freq: Once | INTRAVENOUS | Status: AC
  Administered 2024-01-21: 510 mg via INTRAVENOUS
  Filled 2024-01-21: qty 510

## 2024-01-21 MED ORDER — SODIUM CHLORIDE 0.9 % IV SOLN
INTRAVENOUS | Status: DC
  Filled 2024-01-21: qty 250

## 2024-01-25 ENCOUNTER — Other Ambulatory Visit: Payer: Self-pay

## 2024-01-31 ENCOUNTER — Encounter: Payer: Self-pay | Admitting: Nurse Practitioner

## 2024-02-04 ENCOUNTER — Inpatient Hospital Stay: Attending: Oncology

## 2024-02-04 VITALS — BP 145/85 | HR 72 | Temp 98.4°F | Resp 17

## 2024-02-04 DIAGNOSIS — N1832 Chronic kidney disease, stage 3b: Secondary | ICD-10-CM | POA: Insufficient documentation

## 2024-02-04 DIAGNOSIS — D509 Iron deficiency anemia, unspecified: Secondary | ICD-10-CM | POA: Diagnosis present

## 2024-02-04 MED ORDER — SODIUM CHLORIDE 0.9 % IV SOLN
INTRAVENOUS | Status: DC
  Filled 2024-02-04: qty 250

## 2024-02-04 MED ORDER — SODIUM CHLORIDE 0.9 % IV SOLN
510.0000 mg | Freq: Once | INTRAVENOUS | Status: AC
  Administered 2024-02-04: 510 mg via INTRAVENOUS
  Filled 2024-02-04: qty 510

## 2024-02-04 NOTE — Progress Notes (Signed)
 Declined post-observation. Aware of risks. Vitals stable at discharge.

## 2024-02-04 NOTE — Patient Instructions (Signed)

## 2024-02-15 ENCOUNTER — Encounter: Admitting: Internal Medicine

## 2024-02-18 ENCOUNTER — Encounter: Admitting: Internal Medicine

## 2024-02-24 ENCOUNTER — Ambulatory Visit: Admitting: Nurse Practitioner

## 2024-02-24 ENCOUNTER — Encounter: Payer: Self-pay | Admitting: Nurse Practitioner

## 2024-02-24 VITALS — BP 128/82 | HR 75 | Temp 97.5°F | Ht 62.0 in | Wt 192.8 lb

## 2024-02-24 DIAGNOSIS — N1832 Chronic kidney disease, stage 3b: Secondary | ICD-10-CM

## 2024-02-24 DIAGNOSIS — I10 Essential (primary) hypertension: Secondary | ICD-10-CM

## 2024-02-24 DIAGNOSIS — E7800 Pure hypercholesterolemia, unspecified: Secondary | ICD-10-CM

## 2024-02-24 DIAGNOSIS — G4733 Obstructive sleep apnea (adult) (pediatric): Secondary | ICD-10-CM

## 2024-02-24 DIAGNOSIS — I5022 Chronic systolic (congestive) heart failure: Secondary | ICD-10-CM

## 2024-02-24 MED ORDER — REPATHA SURECLICK 140 MG/ML ~~LOC~~ SOAJ: SUBCUTANEOUS | 3 refills | Status: AC

## 2024-02-24 NOTE — Progress Notes (Unsigned)
 Established Patient Office Visit  Subjective:  Patient ID: Debbie Delacruz, female    DOB: 1945/10/16  Age: 78 y.o. MRN: [id number]  CC:  Chief Complaint  Patient presents with   Establish Care    Transfer of Care   Discussed the use of a AI scribe software for clinical note transcription with the patient, who gave verbal consent to proceed.  HPI  Demiya Delacruz presents for transfer of care. Her previous PCP was dr. Maribeth. She is over all doing well. No concerns at present.   CKD stage IIIb: Seen by nephrology.  History of renal cell carcinoma and s/p right nephrectomy in 2022.  CAD: She has a history of CAD and CABG in 2016.  She is on Zetia , aspirin , amlodipine , Crestor  and Repatha .  Hyperlipidemia:  Lab Results  Component Value Date   CHOL 171 02/12/2023   HDL 47.60 02/12/2023   LDLCALC 93 02/12/2023   LDLDIRECT 45.0 07/07/2021   TRIG 154.0 (H) 02/12/2023   CHOLHDL 4 02/12/2023     Hypertension: On amlodipine  and metoprolol   Iron  deficiency anemia: She has history of iron  deficiency anemia had prior iron  transfusion   HPI   Past Medical History:  Diagnosis Date   A-fib (HCC)    Anemia    Anginal pain    Anxiety    Arthritis    CAD (coronary artery disease)    Cancer (HCC)    Carpal tunnel syndrome    bilateral   Cataract    bil removed cataracts   Coronary artery disease    2x stents, Dr. Florencio   Depression    Dysrhythmia    Family history of adverse reaction to anesthesia    brother; S/P lipotripsy in [place]; transferred to [facility]; had to put him on life support for 2 days   GERD (gastroesophageal reflux disease)    Headache    weekly (09/13/2014)   History of blood transfusion 05/2014   we haven't figured out why I needed it yet (09/13/2014)   History of hiatal hernia    Hyperglycemia    Hyperlipidemia    Hypertension    Kidney stone    Myocardial infarct (HCC)    Myocardial infarct, old 1999   15 years  ago   Obesity    PONV (postoperative nausea and vomiting)    Renal mass    S/P CABG x 4 09/18/2014   LIMA to LAD, SVG to PDA-dRCA sequentially, SVG to OM, EVH via right thigh    Sleep apnea    Sleep apnea    don't use a mask (09/13/2014)   Unstable angina [facility])     Past Surgical History:  Procedure Laterality Date   ABDOMINAL HYSTERECTOMY     APPENDECTOMY     BACK SURGERY     BLADDER SURGERY     CARDIAC CATHETERIZATION  06/2014   CARDIAC CATHETERIZATION N/A 06/16/2016   Procedure: Left Heart Cath and Cors/Grafts Angiography;  Surgeon: Lonni JONETTA Cash, MD;  Location: [facility] INVASIVE CV LAB;  Service: Cardiovascular;  Laterality: N/A;   CARPAL TUNNEL RELEASE Left    CATARACT EXTRACTION W/ INTRAOCULAR LENS  IMPLANT, BILATERAL     CHOLECYSTECTOMY     COLONOSCOPY  2022   MS-MAC-prep good-tics/multiple frag of TA-1 yr recall   CORONARY ANGIOPLASTY WITH STENT PLACEMENT  1999   [facility] x2 stent   CORONARY ARTERY BYPASS GRAFT N/A 09/18/2014   Procedure: CORONARY ARTERY BYPASS GRAFTING (CABG)times four using LIMA  to  LAD:SVG to  PD and DIST RCA;SVG to OM;, EVH Right thigh;  Surgeon: Sudie VEAR Laine, MD;  Location: [facility] OR;  Service: Open Heart Surgery;  Laterality: N/A;   CYSTOSCOPY W/ RETROGRADES Left 03/15/2020   Procedure: CYSTOSCOPY WITH RETROGRADE PYELOGRAM;  Surgeon: Francisca Redell BROCKS, MD;  Location: [facility] ORS;  Service: Urology;  Laterality: Left;   CYSTOSCOPY WITH STENT PLACEMENT Left 02/20/2020   Procedure: CYSTOSCOPY WITH STENT PLACEMENT;  Surgeon: Francisca Redell BROCKS, MD;  Location: [facility] ORS;  Service: Urology;  Laterality: Left;   CYSTOSCOPY/URETEROSCOPY/HOLMIUM LASER/STENT PLACEMENT Left 03/15/2020   Procedure: CYSTOSCOPY/URETEROSCOPY;  Surgeon: Francisca Redell BROCKS, MD;  Location: [facility] ORS;  Service: Urology;  Laterality: Left;   ESOPHAGOGASTRODUODENOSCOPY N/A 09/17/2014   Procedure: ESOPHAGOGASTRODUODENOSCOPY (EGD);  Surgeon: Lamar JONETTA Aho, MD;  Location: [facility] ENDOSCOPY;  Service:  Endoscopy;  Laterality: N/A;   heart stents     1999   INCONTINENCE SURGERY     INGUINAL HERNIA REPAIR     INGUINAL HERNIA REPAIR Right X 3   last one was in the 1990's; still have hernia there now (09/13/2014)   LAPAROSCOPIC CHOLECYSTECTOMY     LUMBAR DISC SURGERY  1990's?   LUMBAR SPINE SURGERY     NEPHRECTOMY Right    removal of ovaries     TEE WITHOUT CARDIOVERSION N/A 09/18/2014   Procedure: TRANSESOPHAGEAL ECHOCARDIOGRAM (TEE);  Surgeon: Sudie VEAR Laine, MD;  Location: [facility] OR;  Service: Open Heart Surgery;  Laterality: N/A;   UPPER GASTROINTESTINAL ENDOSCOPY      Family History  Problem Relation Age of Onset   Coronary artery disease Mother    Osteoporosis Mother    Heart disease Mother    Stroke Mother    Heart attack Mother    Hypertension Mother    Hypertension Father    Coronary artery disease Father    COPD Father    Heart disease Father    Heart attack Father    Glaucoma Father    Osteoporosis Father    Emphysema Father    Cancer Sister 26       colon   Hypertension Sister    Colon cancer Sister        dx in her 77's   Breast cancer Sister    Colon polyps Sister    Stroke Brother    Hypertension Brother    Colon polyps Brother    Stroke Maternal Grandfather    Stroke Maternal Grandmother    Osteoporosis Maternal Grandmother    Hypertension Maternal Grandmother    Hypertension Paternal Grandmother    Hypertension Paternal Grandfather    Breast cancer Maternal Aunt        2 aunts-50's   Esophageal cancer Neg Hx    Stomach cancer Neg Hx    Rectal cancer Neg Hx    Pancreatic cancer Neg Hx     Social History   Socioeconomic History   Marital status: Married    Spouse name: Not on file   Number of children: Not on file   Years of education: Not on file   Highest education level: Not on file  Occupational History   Not on file  Tobacco Use   Smoking status: Never    Passive exposure: Never   Smokeless tobacco: Never  Vaping Use   Vaping  status: Never Used  Substance and Sexual Activity   Alcohol use: Not Currently    Alcohol/week: 0.0 standard drinks of alcohol   Drug use: No   Sexual activity:  Not Currently    Birth control/protection: Post-menopausal  Other Topics Concern   Not on file  Social History Narrative   ** Merged History Encounter **       Lives in [place] with 10 YO nephew and husband. 3 dogs outside.  Work - Therapist, sports, [employer]  Diet - regular, limited meat  Exercise - walks some   Social Drivers of Corporate investment banker Strain: Low Risk  (03/26/2023)   Overall Financial Resource Strain (CARDIA)    Difficulty of Paying Living Expenses: Not hard at all  Food Insecurity: No Food Insecurity (04/30/2023)   Hunger Vital Sign    Worried About Running Out of Food in the Last Year: Never true    Ran Out of Food in the Last Year: Never true  Transportation Needs: No Transportation Needs (04/30/2023)   PRAPARE - Administrator, Civil Service (Medical): No    Lack of Transportation (Non-Medical): No  Physical Activity: Insufficiently Active (03/26/2023)   Exercise Vital Sign    Days of Exercise per Week: 2 days    Minutes of Exercise per Session: 30 min  Stress: No Stress Concern Present (03/26/2023)   [employer] of Occupational Health - Occupational Stress Questionnaire    Feeling of Stress : Not at all  Social Connections: Socially Integrated (03/26/2023)   Social Connection and Isolation Panel    Frequency of Communication with Friends and Family: More than three times a week    Frequency of Social Gatherings with Friends and Family: Never    Attends Religious Services: More than 4 times per year    Active Member of [employer] or Organizations: Yes    Attends Banker Meetings: Never    Marital Status: Married  Catering manager Violence: Not At Risk (04/30/2023)   Humiliation, Afraid, Rape, and Kick questionnaire    Fear of Current or Ex-Partner: No     Emotionally Abused: No    Physically Abused: No    Sexually Abused: No     Outpatient Medications Prior to Visit  Medication Sig Dispense Refill   acetaminophen  (TYLENOL ) 500 MG tablet Take 500 mg by mouth every 6 (six) hours as needed for headache.     amiodarone  (PACERONE ) 100 MG tablet Take 1 tablet (100 mg total) by mouth daily. 90 tablet 3   amLODipine  (NORVASC ) 10 MG tablet Take 1 tablet (10 mg total) by mouth daily. 30 tablet 6   apixaban  (ELIQUIS ) 5 MG TABS tablet Take 1 tablet (5 mg total) by mouth 2 (two) times daily. 60 tablet 5   aspirin  EC 81 MG tablet Take 1 tablet (81 mg total) by mouth daily.     Blood Pressure Monitoring (BLOOD PRESSURE KIT) DEVI Check blood pressure daily, brand at patient and insurance preference, dx code I10 1 each 0   Docusate Sodium  (DSS) 100 MG CAPS Take 1 capsule by mouth daily at 6 (six) AM.     ezetimibe  (ZETIA ) 10 MG tablet Take 1 tablet by mouth once daily 90 tablet 0   gentamicin ointment (GARAMYCIN) 0.1 % Apply 1 Application topically as needed (to nose as needed).     isosorbide  mononitrate (IMDUR ) 30 MG 24 hr tablet Take 1 tablet by mouth once daily 90 tablet 0   JARDIANCE  10 MG TABS tablet Take 1 tablet (10 mg total) by mouth daily before breakfast. 30 tablet 11   metoprolol  succinate (TOPROL -XL) 50 MG 24 hr tablet TAKE 1 TABLET BY MOUTH ONCE  DAILY . APPOINTMENT REQUIRED FOR FUTURE REFILLS 45 tablet 0   nitroGLYCERIN  (NITROSTAT ) 0.4 MG SL tablet Place 1 tablet (0.4 mg total) under the tongue every 5 (five) minutes as needed for chest pain. Take for up to 3 doses and then call 911 if still having chest pain. 30 tablet 0   pantoprazole  (PROTONIX ) 40 MG tablet Take 1 tablet by mouth once daily 90 tablet 0   REPATHA  SURECLICK 140 MG/ML SOAJ INJECT 140 MG INTO THE SKIN EVERY 14 DAYS 2 mL 0   rosuvastatin  (CRESTOR ) 40 MG tablet Take 1 tablet (40 mg total) by mouth daily. 90 tablet 3   sacubitril-valsartan (ENTRESTO ) 49-51 MG Take 1 tablet by mouth 2  (two) times daily. 180 tablet 3   Sodium Sulfate-Mag Sulfate-KCl (SUTAB ) [phone] MG TABS Take 1 kit by mouth as directed. Patient to bring in coupon 24 tablet 0   No facility-administered medications prior to visit.    Allergies  Allergen Reactions   Ferrous Fumarate Other (See Comments)    STOMACH ISSUES STOMACH ISSUES STOMACH ISSUES   Lipitor  [Atorvastatin ] Other (See Comments)    Myalgia    ROS Review of Systems Negative unless indicated in HPI.    Objective:    Physical Exam  BP 128/82   Pulse 75   Temp (!) 97.5 F (36.4 C)   Ht 5' 2 (1.575 m)   Wt 192 lb 12.8 oz (87.5 kg)   SpO2 98%   BMI 35.26 kg/m  Wt Readings from Last 3 Encounters:  02/24/24 192 lb 12.8 oz (87.5 kg)  12/31/23 191 lb 9.6 oz (86.9 kg)  11/25/23 195 lb 12.8 oz (88.8 kg)     Health Maintenance  Topic Date Due   Zoster Vaccines- Shingrix (1 of 2) 11/17/1966   Medicare Annual Wellness (AWV)  03/25/2024   COVID-19 Vaccine (5 - 2025-26 season) 03/10/2024 (Originally 01/24/2024)   Influenza Vaccine  08/22/2024 (Originally 12/24/2023)   Colonoscopy  06/02/2025   DTaP/Tdap/Td (3 - Td or Tdap) 02/07/2031   Pneumococcal Vaccine: 50+ Years  Completed   DEXA SCAN  Completed   Hepatitis C Screening  Completed   HPV VACCINES  Aged Out   Meningococcal B Vaccine  Aged Out   Mammogram  Discontinued    There are no preventive care reminders to display for this patient.  Lab Results  Component Value Date   TSH 2.497 10/29/2023   Lab Results  Component Value Date   WBC 5.0 01/14/2024   HGB 13.2 01/14/2024   HCT 41.3 01/14/2024   MCV 93.9 01/14/2024   PLT 194 01/14/2024   Lab Results  Component Value Date   NA 141 01/07/2024   K 4.8 01/07/2024   CO2 22 01/07/2024   GLUCOSE 106 (H) 01/07/2024   BUN 31 (H) 01/07/2024   CREATININE 1.91 (H) 01/07/2024   BILITOT 0.7 10/29/2023   ALKPHOS 47 10/29/2023   AST 18 10/29/2023   ALT 14 10/29/2023   PROT 7.3 10/29/2023   ALBUMIN  4.1  10/29/2023   CALCIUM  8.9 01/07/2024   ANIONGAP 10 01/07/2024   EGFR 37 (L) 07/18/2021   GFR 33.26 (L) 07/07/2021   Lab Results  Component Value Date   CHOL 171 02/12/2023   Lab Results  Component Value Date   HDL 47.60 02/12/2023   Lab Results  Component Value Date   LDLCALC 93 02/12/2023   Lab Results  Component Value Date   TRIG 154.0 (H) 02/12/2023   Lab Results  Component Value  Date   CHOLHDL 4 02/12/2023   Lab Results  Component Value Date   HGBA1C 6.1 (H) 07/09/2023      Assessment & Plan:  Pure hypercholesterolemia    Follow-up: No follow-ups on file.   Skyrah Krupp, NP

## 2024-03-06 ENCOUNTER — Telehealth: Payer: Self-pay | Admitting: Nurse Practitioner

## 2024-03-06 DIAGNOSIS — I5022 Chronic systolic (congestive) heart failure: Secondary | ICD-10-CM | POA: Insufficient documentation

## 2024-03-06 DIAGNOSIS — G4733 Obstructive sleep apnea (adult) (pediatric): Secondary | ICD-10-CM | POA: Insufficient documentation

## 2024-03-06 NOTE — Telephone Encounter (Signed)
 LMTCB and sent MyChart message for scheduling TOC from Chelsea Aurora, NP to Dr Luke Shade   E2C2 please transfer pt to front office for scheduling -kh

## 2024-03-08 ENCOUNTER — Encounter: Payer: Self-pay | Admitting: Nurse Practitioner

## 2024-03-08 NOTE — Assessment & Plan Note (Signed)
 Chronic issue.  Lab Results  Component Value Date   CHOL 171 02/12/2023   HDL 47.60 02/12/2023   LDLCALC 93 02/12/2023   LDLDIRECT 45.0 07/07/2021   TRIG 154.0 (H) 02/12/2023   CHOLHDL 4 02/12/2023  - Continue Zetia , Crestor  and Repatha . - Repatha  refilled. - Will follow lipid panel.

## 2024-03-08 NOTE — Assessment & Plan Note (Signed)
 She is followed by cardiology. On Entresto  and Jardiance 

## 2024-03-08 NOTE — Assessment & Plan Note (Signed)
Chronic issue.  Seems to be decently well-controlled at home.  She will continue Imdur 30 mg daily, losartan 100 mg daily, metoprolol 50 mg daily, and amlodipine 5 mg daily.

## 2024-03-08 NOTE — Assessment & Plan Note (Signed)
 Chronic. Lab Results  Component Value Date   NA 141 01/07/2024   CL 109 01/07/2024   K 4.8 01/07/2024   CO2 22 01/07/2024   BUN 31 (H) 01/07/2024   CREATININE 1.91 (H) 01/07/2024   GFRNONAA 27 (L) 01/07/2024   CALCIUM  8.9 01/07/2024   PHOS 5.1 (H) 02/21/2020   ALBUMIN  4.1 10/29/2023   GLUCOSE 106 (H) 01/07/2024  -Followed by nephrologyShe is followed by cardiology. On Entresto  and Jardiance 

## 2024-03-14 ENCOUNTER — Other Ambulatory Visit: Payer: Self-pay | Admitting: Nurse Practitioner

## 2024-03-14 ENCOUNTER — Encounter: Payer: Self-pay | Admitting: Nurse Practitioner

## 2024-03-14 DIAGNOSIS — K2270 Barrett's esophagus without dysplasia: Secondary | ICD-10-CM

## 2024-03-14 DIAGNOSIS — K2970 Gastritis, unspecified, without bleeding: Secondary | ICD-10-CM

## 2024-03-14 MED ORDER — PANTOPRAZOLE SODIUM 40 MG PO TBEC: 40.0000 mg | DELAYED_RELEASE_TABLET | Freq: Every day | ORAL | 0 refills | Status: DC

## 2024-03-14 NOTE — Progress Notes (Signed)
Pantoprazole 40 mg - filled

## 2024-03-14 NOTE — Telephone Encounter (Signed)
 Last filled by Dr. Aneita. Is it okay to refill?

## 2024-03-14 NOTE — Telephone Encounter (Signed)
 LOV 02/24/24  NOV 07/20/24  The provider that prescribed this medication says permanently closed which was a GI provider.

## 2024-03-17 ENCOUNTER — Encounter: Admitting: Internal Medicine

## 2024-03-23 ENCOUNTER — Ambulatory Visit: Admitting: Urology

## 2024-03-23 DIAGNOSIS — N3281 Overactive bladder: Secondary | ICD-10-CM

## 2024-03-28 ENCOUNTER — Ambulatory Visit: Payer: Medicare Other

## 2024-03-31 ENCOUNTER — Ambulatory Visit: Admitting: Urology

## 2024-04-04 ENCOUNTER — Ambulatory Visit

## 2024-04-06 ENCOUNTER — Telehealth: Payer: Self-pay | Admitting: Family

## 2024-04-06 NOTE — Telephone Encounter (Signed)
 Called to confirm/remind patient of [facility] on 04/07[facility].   Appointment:   [] Confirmed  [x] Left mess   [] No answer/No voice mail  [] VM Full/unable to leave message  [] Phone not in service  Patient reminded to bring all medications and/or complete list.  Confirmed patient has transportation. Gave directions, instructed to utilize valet parking.

## 2024-04-06 NOTE — Progress Notes (Unsigned)
 ADVANCED HF CLINIC NOTE  Referring Physician:  Primary Care: [facility]  Primary Cardiologist: Redell Cave, MD  Chief Complaint:   HPI:  Debbie Delacruz is a 78 y.o. female with a hx of CAD/CABG x4 in 2016 (LIMA to LAD, SVG -OM,PDA, PL), hypertension, hyperlipidemia, iron  deficiency anemia, right renal mass, CKD 3b.  She was recently diagnosed with a right renal mass, evaluated by oncology, suspicious for underlying malignancy.  Resection is being considered.  Long h/o HTN. Had first MI at age 56 y/o. Had cath with stents. Developed recurrent angina in 2016. Cath with severe CAD. Underwent CABG x 4 with Dr. Dusty 2016   Saw Dr. Fernande in 2018 for recurrent syncope and felt to have dysautonomia   Saw Dr. Budd in 1/22 for pre-op clearance for right RCC. Myoview  2/22 EF 70% no ischemia  Had full right nephrectomy.   Echo 2/22 EF 50-55% moderate RV dysfunction. Mild to moderate MR Pulmonary pressures normal  Seen for the first time 06/11/23 for evaluation of severe SOB and palpitation. We did echo and zio.   Zio 1/25: Sinus rhythm. 1% AF 243 runs SVT PVCs 14.5%   Went for echo 2/10 and found to be in AF with RVR at the time. Went to ER and broke spontaneously. Started on po amio.  Echo 45-50% in setting of AF with RVR RV ok Marked LAE  HST: 1/25 AHI 0.7  Moderate to severe snoring sats down to 92% -> sent for in lab study  cMRI 10/20/23 EF 58%  subendocardial (25-50% wall thickness) LGE mid-apical, anteroseptal segment and basal-mid inferolateral segment. RV ok mild MR  Seen in [facility] 07/25 where jardiance  10mg  daily was started.   She presents today for a HF follow-up visit with a chief complaint of   Prior notes Left heart cath 05/2016 patent LIMA to LAD, patent SVG to OM, patent SVG to PDA, occluded SVG to PL. Echo 05/2016, EF 55 to 60%, basal inferior hypokinesis, impaired relaxation.   ROS: All systems negative except what is listed in HPI, PMH and Problem  List   Past Medical History:  Diagnosis Date   A-fib (HCC)    Anemia    Anginal pain    Anxiety    Arthritis    CAD (coronary artery disease)    Cancer (HCC)    Carpal tunnel syndrome    bilateral   Cataract    bil removed cataracts   Coronary artery disease    2x stents, Dr. Florencio   Depression    Dysrhythmia    Family history of adverse reaction to anesthesia    brother; S/P lipotripsy in [place]; transferred to [facility]; had to put him on life support for 2 days   GERD (gastroesophageal reflux disease)    Headache    weekly (09/13/2014)   History of blood transfusion 05/2014   we haven't figured out why I needed it yet (09/13/2014)   History of hiatal hernia    Hyperglycemia    Hyperlipidemia    Hypertension    Kidney stone    Myocardial infarct [facility])    Myocardial infarct, old 1999   15 years ago   Obesity    PONV (postoperative nausea and vomiting)    Renal mass    S/P CABG x 4 09/18/2014   LIMA to LAD, SVG to PDA-dRCA sequentially, SVG to OM, EVH via right thigh    Sleep apnea    Sleep apnea  don't use a mask (09/13/2014)   Unstable angina (HCC)     Current Outpatient Medications  Medication Sig Dispense Refill   acetaminophen  (TYLENOL ) 500 MG tablet Take 500 mg by mouth every 6 (six) hours as needed for headache.     amiodarone  (PACERONE ) 100 MG tablet Take 1 tablet (100 mg total) by mouth daily. 90 tablet 3   amLODipine  (NORVASC ) 10 MG tablet Take 1 tablet (10 mg total) by mouth daily. 30 tablet 6   apixaban  (ELIQUIS ) 5 MG TABS tablet Take 1 tablet (5 mg total) by mouth 2 (two) times daily. 60 tablet 5   aspirin  EC 81 MG tablet Take 1 tablet (81 mg total) by mouth daily.     Blood Pressure Monitoring (BLOOD PRESSURE KIT) DEVI Check blood pressure daily, brand at patient and insurance preference, dx code I10 1 each 0   Docusate Sodium  (DSS) 100 MG CAPS Take 1 capsule by mouth daily at 6 (six) AM.     Evolocumab  (REPATHA  SURECLICK) 140  MG/ML SOAJ INJECT 140 MG INTO THE SKIN EVERY 14 DAYS 2 mL 3   ezetimibe  (ZETIA ) 10 MG tablet Take 1 tablet by mouth once daily 90 tablet 0   gentamicin ointment (GARAMYCIN) 0.1 % Apply 1 Application topically as needed (to nose as needed).     isosorbide  mononitrate (IMDUR ) 30 MG 24 hr tablet Take 1 tablet by mouth once daily 90 tablet 0   JARDIANCE  10 MG TABS tablet Take 1 tablet (10 mg total) by mouth daily before breakfast. 30 tablet 11   metoprolol  succinate (TOPROL -XL) 50 MG 24 hr tablet TAKE 1 TABLET BY MOUTH ONCE DAILY . APPOINTMENT REQUIRED FOR FUTURE REFILLS 45 tablet 0   nitroGLYCERIN  (NITROSTAT ) 0.4 MG SL tablet Place 1 tablet (0.4 mg total) under the tongue every 5 (five) minutes as needed for chest pain. Take for up to 3 doses and then call 911 if still having chest pain. 30 tablet 0   pantoprazole  (PROTONIX ) 40 MG tablet Take 1 tablet (40 mg total) by mouth daily. 90 tablet 0   rosuvastatin  (CRESTOR ) 40 MG tablet Take 1 tablet (40 mg total) by mouth daily. 90 tablet 3   sacubitril-valsartan (ENTRESTO ) 49-51 MG Take 1 tablet by mouth 2 (two) times daily. 180 tablet 3   Sodium Sulfate-Mag Sulfate-KCl (SUTAB ) [phone] MG TABS Take 1 kit by mouth as directed. Patient to bring in coupon 24 tablet 0   No current facility-administered medications for this visit.    Allergies  Allergen Reactions   Ferrous Fumarate Other (See Comments)    STOMACH ISSUES STOMACH ISSUES STOMACH ISSUES   Lipitor  [Atorvastatin ] Other (See Comments)    Myalgia      Social History   Socioeconomic History   Marital status: Married    Spouse name: Not on file   Number of children: Not on file   Years of education: Not on file   Highest education level: Not on file  Occupational History   Not on file  Tobacco Use   Smoking status: Never    Passive exposure: Never   Smokeless tobacco: Never  Vaping Use   Vaping status: Never Used  Substance and Sexual Activity   Alcohol use: Not Currently     Alcohol/week: 0.0 standard drinks of alcohol   Drug use: No   Sexual activity: Not Currently    Birth control/protection: Post-menopausal  Other Topics Concern   Not on file  Social History Narrative   ** Merged History Encounter **  Lives in [place] with 10 YO nephew and husband. 3 dogs outside.  Work - Therapist, sports, [employer]  Diet - regular, limited meat  Exercise - walks some   Social Drivers of Corporate Investment Banker Strain: Low Risk  (03/26/2023)   Overall Financial Resource Strain (CARDIA)    Difficulty of Paying Living Expenses: Not hard at all  Food Insecurity: No Food Insecurity (04/30/2023)   Hunger Vital Sign    Worried About Running Out of Food in the Last Year: Never true    Ran Out of Food in the Last Year: Never true  Transportation Needs: No Transportation Needs (04/30/2023)   PRAPARE - Administrator, Civil Service (Medical): No    Lack of Transportation (Non-Medical): No  Physical Activity: Insufficiently Active (03/26/2023)   Exercise Vital Sign    Days of Exercise per Week: 2 days    Minutes of Exercise per Session: 30 min  Stress: No Stress Concern Present (03/26/2023)   [employer] of Occupational Health - Occupational Stress Questionnaire    Feeling of Stress : Not at all  Social Connections: Socially Integrated (03/26/2023)   Social Connection and Isolation Panel    Frequency of Communication with Friends and Family: More than three times a week    Frequency of Social Gatherings with Friends and Family: Never    Attends Religious Services: More than 4 times per year    Active Member of [employer] or Organizations: Yes    Attends Banker Meetings: Never    Marital Status: Married  Catering Manager Violence: Not At Risk (04/30/2023)   Humiliation, Afraid, Rape, and Kick questionnaire    Fear of Current or Ex-Partner: No    Emotionally Abused: No    Physically Abused: No    Sexually Abused: No       Family History  Problem Relation Age of Onset   Coronary artery disease Mother    Osteoporosis Mother    Heart disease Mother    Stroke Mother    Heart attack Mother    Hypertension Mother    Hypertension Father    Coronary artery disease Father    COPD Father    Heart disease Father    Heart attack Father    Glaucoma Father    Osteoporosis Father    Emphysema Father    Cancer Sister 59       colon   Hypertension Sister    Colon cancer Sister        dx in her 101's   Breast cancer Sister    Colon polyps Sister    Stroke Brother    Hypertension Brother    Colon polyps Brother    Stroke Maternal Grandfather    Stroke Maternal Grandmother    Osteoporosis Maternal Grandmother    Hypertension Maternal Grandmother    Hypertension Paternal Grandmother    Hypertension Paternal Grandfather    Breast cancer Maternal Aunt        2 aunts-50's   Esophageal cancer Neg Hx    Stomach cancer Neg Hx    Rectal cancer Neg Hx    Pancreatic cancer Neg Hx       PHYSICAL EXAM:  General: Well appearing.  Cor: No JVD. Regular rhythm, rate.  Lungs: clear Abdomen: soft, nontender, nondistended. Extremities: no edema Neuro:. Affect pleasant   ASSESSMENT & PLAN:  1. Chronic heart failure with reduced ejection fraction- - I continue to suspect the main driver here is severe  untreated OSA +/- frequent PVCs  - NYHA class - euvolemic - However HST 1/25 was negative. (Noted heavy snoring but AHI < 1.0)  - Had in-lab sleep study 06/25 - weight 191.6 from last visit here 3 months ago - Continue entresto  49/51mg  BID - Continue jardiance  10mg  daily  - iron  infusion done 02/04/24 - BNP 10/29/23 was 317.1  2. OSA, untreated - unable to tolerate CPAP in past but that was years ago - HST 2/25: AHI 0.7  Moderate to severe snoring sats down to 92%  - sleep study done 06/25  3. RV dysfunction - suspect OSA/OHS. - Echo 02/25 EF 45-50% in setting of AF with RVR. RV ok  - cMRI 5/25 RV normal   - continue entresto  and jardiance  - wear compression socks daily with removal at bedtime  4. Frequent PVCs/SVT - Zio 1/25: Sinus rhythm. 1% AF 243 runs SVT PVCs 14.5%  - now on amio for AF. PVCs suppressed - continue amio for now. Eventually wean to mexilitene  5. PAF - regular today.continue amio for now.  - continue Eliquis  5 bid. No bleeding  6. CAD s/p CABG - no s/s angina - continue Eliquis /statin/zetia  - cMRI 10/20/23 EF 58%  subendocardial (25-50% wall thickness) LGE mid-apical, anteroseptal segment and basal-mid inferolateral segment. RV ok mild MR  7. CKD 3b  - baseline Scr 1.6 in setting of solitary kidney with h/o R nephrectomy due to RCC - Continue entresto  - Continue jardiance  10mg  daily - saw nephrology Geoffry) 07/25  8. Obesity - BMI 35.04 kg/ m2 - unable to get GLP1 approved by insurance  9. HTN - BP  - saw PCP Ester) 10/25 - BMET 01/07/24 reviewed: sodium 141, potassium 4.8, creatinine 1.91 & GFR 27 - BMET today     Ellouise DELENA Class, [place]  12/30/23

## 2024-04-07 ENCOUNTER — Other Ambulatory Visit
Admission: RE | Admit: 2024-04-07 | Discharge: 2024-04-07 | Disposition: A | Discharge status: Home / Self Care | Source: Ambulatory Visit | Attending: Family | Admitting: Family

## 2024-04-07 ENCOUNTER — Other Ambulatory Visit (HOSPITAL_COMMUNITY): Payer: Self-pay | Admitting: Family

## 2024-04-07 ENCOUNTER — Ambulatory Visit: Admitting: Family

## 2024-04-07 ENCOUNTER — Ambulatory Visit: Payer: Self-pay | Admitting: Family

## 2024-04-07 ENCOUNTER — Encounter: Payer: Self-pay | Admitting: Family

## 2024-04-07 ENCOUNTER — Ambulatory Visit (HOSPITAL_COMMUNITY)
Admission: RE | Admit: 2024-04-07 | Discharge: 2024-04-07 | Disposition: A | Discharge status: Home / Self Care | Source: Ambulatory Visit | Attending: Family | Admitting: Family

## 2024-04-07 VITALS — BP 114/71 | HR 91 | Ht 62.0 in | Wt 192.2 lb

## 2024-04-07 DIAGNOSIS — I480 Paroxysmal atrial fibrillation: Secondary | ICD-10-CM | POA: Insufficient documentation

## 2024-04-07 DIAGNOSIS — I2510 Atherosclerotic heart disease of native coronary artery without angina pectoris: Secondary | ICD-10-CM | POA: Insufficient documentation

## 2024-04-07 DIAGNOSIS — Z905 Acquired absence of kidney: Secondary | ICD-10-CM | POA: Insufficient documentation

## 2024-04-07 DIAGNOSIS — N1832 Chronic kidney disease, stage 3b: Secondary | ICD-10-CM | POA: Diagnosis not present

## 2024-04-07 DIAGNOSIS — I5022 Chronic systolic (congestive) heart failure: Secondary | ICD-10-CM

## 2024-04-07 DIAGNOSIS — G4733 Obstructive sleep apnea (adult) (pediatric): Secondary | ICD-10-CM

## 2024-04-07 DIAGNOSIS — I130 Hypertensive heart and chronic kidney disease with heart failure and stage 1 through stage 4 chronic kidney disease, or unspecified chronic kidney disease: Secondary | ICD-10-CM | POA: Insufficient documentation

## 2024-04-07 DIAGNOSIS — Z7901 Long term (current) use of anticoagulants: Secondary | ICD-10-CM | POA: Insufficient documentation

## 2024-04-07 DIAGNOSIS — I493 Ventricular premature depolarization: Secondary | ICD-10-CM

## 2024-04-07 DIAGNOSIS — Z6835 Body mass index (BMI) 35.0-35.9, adult: Secondary | ICD-10-CM | POA: Insufficient documentation

## 2024-04-07 DIAGNOSIS — Z85528 Personal history of other malignant neoplasm of kidney: Secondary | ICD-10-CM | POA: Diagnosis not present

## 2024-04-07 DIAGNOSIS — R5383 Other fatigue: Secondary | ICD-10-CM | POA: Insufficient documentation

## 2024-04-07 DIAGNOSIS — Z951 Presence of aortocoronary bypass graft: Secondary | ICD-10-CM | POA: Insufficient documentation

## 2024-04-07 DIAGNOSIS — R002 Palpitations: Secondary | ICD-10-CM

## 2024-04-07 DIAGNOSIS — I10 Essential (primary) hypertension: Secondary | ICD-10-CM

## 2024-04-07 DIAGNOSIS — I252 Old myocardial infarction: Secondary | ICD-10-CM | POA: Insufficient documentation

## 2024-04-07 DIAGNOSIS — E669 Obesity, unspecified: Secondary | ICD-10-CM | POA: Insufficient documentation

## 2024-04-07 LAB — BASIC METABOLIC PANEL WITH GFR
Anion gap: 12 (ref 5–15)
BUN: 21 mg/dL (ref 8–23)
CO2: 22 mmol/L (ref 22–32)
Calcium: 9.1 mg/dL (ref 8.9–10.3)
Chloride: 106 mmol/L (ref 98–111)
Creatinine, Ser: 1.65 mg/dL — ABNORMAL HIGH (ref 0.44–1.00)
GFR, Estimated: 32 mL/min — ABNORMAL LOW (ref >60)
Glucose, Bld: 122 mg/dL — ABNORMAL HIGH (ref 70–99)
Potassium: 4.7 mmol/L (ref 3.5–5.1)
Sodium: 140 mmol/L (ref 135–145)

## 2024-04-07 LAB — LIPID PANEL
Cholesterol: 112 mg/dL (ref 0–200)
HDL: 55 mg/dL (ref >40)
LDL Cholesterol: 38 mg/dL (ref 0–99)
Total CHOL/HDL Ratio: 2 ratio
Triglycerides: 92 mg/dL (ref <150)
VLDL: 18 mg/dL (ref 0–40)

## 2024-04-07 NOTE — Progress Notes (Signed)
 Zio patch placed onto patient.  All instructions and information reviewed with patient, they verbalize understanding with no questions.

## 2024-04-07 NOTE — Patient Instructions (Signed)
 Medication Changes:  No Changes In Medications at this time.    Lab Work:  Go downstairs to [facility] on [address] to have your blood work completed.  We will only call you if the results are abnormal or if the provider would like to make medication changes.  No news is good news.  Testing/Procedures:  Your provider has recommended that  you wear a Zio Patch for 7 days.  This monitor will record your heart rhythm for our review.  IF you have any symptoms while wearing the monitor please press the button.  If you have any issues with the patch or you notice a red or orange light on it please call the company at [phone].  Once you remove the patch please mail it back to the company as soon as possible so we can get the results.  Follow-Up in: 2 months as scheduled   If you have any questions or concerns before your next appointment please send us  a message through [facility] or call our office at [phone], If it is after office hours your call will be answered by our answering service and directed appropriately.     At the [facility], you and your health needs are our priority. We have a designated team specialized in the treatment of Heart Failure. This Care Team includes your primary Heart Failure Specialized Cardiologist (physician), Advanced Practice Providers (APPs- Physician Assistants and Nurse Practitioners), and Pharmacist who all work together to provide you with the care you need, when you need it.   You may see any of the following providers on your designated Care Team at your next follow up:  Dr. Toribio Fuel Dr. Ezra Shuck Dr. Ria Commander Dr. Odis Brownie Greig Mosses, NP Caffie Shed, [place] [address] [place], [place] Beckey Coe, NP Jordan Lee, NP Ellouise Class, NP Jaun Bash, PharmD

## 2024-04-11 NOTE — Telephone Encounter (Signed)
 Open in error

## 2024-04-11 NOTE — Telephone Encounter (Signed)
 open in error

## 2024-05-24 ENCOUNTER — Other Ambulatory Visit: Payer: Self-pay | Admitting: Cardiology

## 2024-05-24 DIAGNOSIS — I4891 Unspecified atrial fibrillation: Secondary | ICD-10-CM

## 2024-05-24 DIAGNOSIS — I2510 Atherosclerotic heart disease of native coronary artery without angina pectoris: Secondary | ICD-10-CM

## 2024-05-24 DIAGNOSIS — I493 Ventricular premature depolarization: Secondary | ICD-10-CM

## 2024-05-24 MED ORDER — AMLODIPINE BESYLATE 10 MG PO TABS: 10.0000 mg | ORAL_TABLET | Freq: Every day | ORAL | 0 refills | Status: DC

## 2024-06-02 ENCOUNTER — Encounter: Payer: Self-pay | Admitting: Oncology

## 2024-06-08 ENCOUNTER — Other Ambulatory Visit: Payer: Self-pay | Admitting: Nurse Practitioner

## 2024-06-08 ENCOUNTER — Encounter: Admitting: Nurse Practitioner

## 2024-06-08 DIAGNOSIS — K2270 Barrett's esophagus without dysplasia: Secondary | ICD-10-CM

## 2024-06-08 DIAGNOSIS — K2970 Gastritis, unspecified, without bleeding: Secondary | ICD-10-CM

## 2024-06-08 NOTE — Progress Notes (Signed)
 "  ADVANCED HF CLINIC NOTE  Referring Physician:  Primary Care: [facility]  Primary Cardiologist: Redell Cave, MD  Chief Complaint: shortness of breath   HPI:  Debbie Delacruz is a 79 y.o. female with a hx of CAD/CABG x4 in 2016 (LIMA to LAD, SVG -OM,PDA, PL), hypertension, hyperlipidemia, iron  deficiency anemia, right renal mass, CKD 3b.  She was recently diagnosed with a right renal mass, evaluated by oncology, suspicious for underlying malignancy.  Resection is being considered.  Long h/o HTN. Had first MI at age 17 y/o. Had cath with stents. Developed recurrent angina in 2016. Cath with severe CAD. Underwent CABG x 4 with Dr. Dusty 2016   Saw Dr. Fernande in 2018 for recurrent syncope and felt to have dysautonomia   Saw Dr. Budd in 1/22 for pre-op clearance for right RCC. Myoview  2/22 EF 70% no ischemia  Had full right nephrectomy.   Echo 2/22 EF 50-55% moderate RV dysfunction. Mild to moderate MR Pulmonary pressures normal  Seen for the first time 06/11/23 for evaluation of severe SOB and palpitation. We did echo and zio.   Zio 1/25: Sinus rhythm. 1% AF 243 runs SVT PVCs 14.5%   Went for echo 2/10 and found to be in AF with RVR at the time. Went to ER and broke spontaneously. Started on po amio.  Echo 45-50% in setting of AF with RVR RV ok Marked LAE  HST: 1/25 AHI 0.7  Moderate to severe snoring sats down to 92% -> sent for in lab study  cMRI 10/20/23 EF 58%  subendocardial (25-50% wall thickness) LGE mid-apical, anteroseptal segment and basal-mid inferolateral segment. RV ok mild MR  Sleep study completed 06/25. AHI 3.7 / hr  Seen in [facility] 07/25 where jardiance  10mg  daily was started.   Zio 12/ 25: Multiple short runs of AF/ flutter, low PVC burden. Underlying rhythm was NSR.   She presents today for a HF follow-up visit with a chief complaint of minimal shortness of breath. Has associated fatigue, pedal edema. Still not sleeping well. Denies any chest pain,  palpitations, dizziness, abdominal distention. 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 Had her sleep study done 06/25 with AHI of 3.7 / hr. She says that she's too claustrophobic to even try wearing a CPAP mask of any sort. Has compression socks at home but doesn't wear them.   Prior notes Left heart cath 05/2016 patent LIMA to LAD, patent SVG to OM, patent SVG to PDA, occluded SVG to PL. Echo 05/2016, EF 55 to 60%, basal inferior hypokinesis, impaired relaxation.   ROS: All systems negative except what is listed in HPI, PMH and Problem List   Past Medical History:  Diagnosis Date   A-fib (HCC)    Anemia    Anginal pain    Anxiety    Arthritis    CAD (coronary artery disease)    Cancer (HCC)    Carpal tunnel syndrome    bilateral   Cataract    bil removed cataracts   Coronary artery disease    2x stents, Dr. Florencio   Depression    Dysrhythmia    Family history of adverse reaction to anesthesia    brother; S/P lipotripsy in [place]; transferred to [facility]; had to put him on life support for 2 days   GERD (gastroesophageal reflux disease)    Headache    weekly (09/13/2014)   History of blood transfusion 05/2014   we haven't figured out why I needed  it yet (09/13/2014)   History of hiatal hernia    Hyperglycemia    Hyperlipidemia    Hypertension    Kidney stone    Myocardial infarct [facility])    Myocardial infarct, old 1999   15 years ago   Obesity    PONV (postoperative nausea and vomiting)    Renal mass    S/P CABG x 4 09/18/2014   LIMA to LAD, SVG to PDA-dRCA sequentially, SVG to OM, EVH via right thigh    Sleep apnea    Sleep apnea    don't use a mask (09/13/2014)   Unstable angina (HCC)     Current Outpatient Medications  Medication Sig Dispense Refill   acetaminophen  (TYLENOL ) 500 MG tablet Take 500 mg by mouth every 6 (six) hours as needed for headache.     amiodarone  (PACERONE ) 100 MG tablet Take 1 tablet (100 mg total) by mouth daily.  (Patient not taking: Reported on 04/07/2024) 90 tablet 3   amLODipine  (NORVASC ) 10 MG tablet Take 1 tablet (10 mg total) by mouth daily. 30 tablet 0   apixaban  (ELIQUIS ) 5 MG TABS tablet Take 1 tablet (5 mg total) by mouth 2 (two) times daily. 60 tablet 5   aspirin  EC 81 MG tablet Take 1 tablet (81 mg total) by mouth daily.     Blood Pressure Monitoring (BLOOD PRESSURE KIT) DEVI Check blood pressure daily, brand at patient and insurance preference, dx code I10 1 each 0   Docusate Sodium  (DSS) 100 MG CAPS Take 1 capsule by mouth daily at 6 (six) AM.     Evolocumab  (REPATHA  SURECLICK) 140 MG/ML SOAJ INJECT 140 MG INTO THE SKIN EVERY 14 DAYS 2 mL 3   ezetimibe  (ZETIA ) 10 MG tablet Take 1 tablet by mouth once daily 90 tablet 0   gentamicin ointment (GARAMYCIN) 0.1 % Apply 1 Application topically as needed (to nose as needed).     isosorbide  mononitrate (IMDUR ) 30 MG 24 hr tablet Take 1 tablet by mouth once daily 90 tablet 0   JARDIANCE  10 MG TABS tablet Take 1 tablet (10 mg total) by mouth daily before breakfast. 30 tablet 11   metoprolol  succinate (TOPROL -XL) 50 MG 24 hr tablet TAKE 1 TABLET BY MOUTH ONCE DAILY . APPOINTMENT REQUIRED FOR FUTURE REFILLS 45 tablet 0   nitroGLYCERIN  (NITROSTAT ) 0.4 MG SL tablet Place 1 tablet (0.4 mg total) under the tongue every 5 (five) minutes as needed for chest pain. Take for up to 3 doses and then call 911 if still having chest pain. 30 tablet 0   pantoprazole  (PROTONIX ) 40 MG tablet Take 1 tablet by mouth once daily 90 tablet 3   rosuvastatin  (CRESTOR ) 40 MG tablet Take 1 tablet (40 mg total) by mouth daily. 90 tablet 3   sacubitril-valsartan (ENTRESTO ) 49-51 MG Take 1 tablet by mouth 2 (two) times daily. 180 tablet 3   Sodium Sulfate-Mag Sulfate-KCl (SUTAB ) [phone] MG TABS Take 1 kit by mouth as directed. Patient to bring in coupon 24 tablet 0   No current facility-administered medications for this visit.    Allergies  Allergen Reactions   Ferrous  Fumarate Other (See Comments)    STOMACH ISSUES STOMACH ISSUES STOMACH ISSUES   Lipitor  [Atorvastatin ] Other (See Comments)    Myalgia      Social History   Socioeconomic History   Marital status: Married    Spouse name: Not on file   Number of children: Not on file   Years of education: Not on  file   Highest education level: Not on file  Occupational History   Not on file  Tobacco Use   Smoking status: Never    Passive exposure: Never   Smokeless tobacco: Never  Vaping Use   Vaping status: Never Used  Substance and Sexual Activity   Alcohol use: Not Currently    Alcohol/week: 0.0 standard drinks of alcohol   Drug use: No   Sexual activity: Not Currently    Birth control/protection: Post-menopausal  Other Topics Concern   Not on file  Social History Narrative   ** Merged History Encounter **       Lives in [place] with 10 YO nephew and husband. 3 dogs outside.  Work - Therapist, sports, [employer]  Diet - regular, limited meat  Exercise - walks some   Social Drivers of Health   Tobacco Use: Low Risk (04/07/2024)   Patient History    Smoking Tobacco Use: Never    Smokeless Tobacco Use: Never    Passive Exposure: Never  Financial Resource Strain: Low Risk (03/26/2023)   Overall Financial Resource Strain (CARDIA)    Difficulty of Paying Living Expenses: Not hard at all  Food Insecurity: No Food Insecurity (04/30/2023)   Hunger Vital Sign    Worried About Running Out of Food in the Last Year: Never true    Ran Out of Food in the Last Year: Never true  Transportation Needs: No Transportation Needs (04/30/2023)   PRAPARE - Administrator, Civil Service (Medical): No    Lack of Transportation (Non-Medical): No  Physical Activity: Insufficiently Active (03/26/2023)   Exercise Vital Sign    Days of Exercise per Week: 2 days    Minutes of Exercise per Session: 30 min  Stress: No Stress Concern Present (03/26/2023)   [employer] of  Occupational Health - Occupational Stress Questionnaire    Feeling of Stress : Not at all  Social Connections: Socially Integrated (03/26/2023)   Social Connection and Isolation Panel    Frequency of Communication with Friends and Family: More than three times a week    Frequency of Social Gatherings with Friends and Family: Never    Attends Religious Services: More than 4 times per year    Active Member of [employer] or Organizations: Yes    Attends Banker Meetings: Never    Marital Status: Married  Catering Manager Violence: Not At Risk (04/30/2023)   Humiliation, Afraid, Rape, and Kick questionnaire    Fear of Current or Ex-Partner: No    Emotionally Abused: No    Physically Abused: No    Sexually Abused: No  Depression (PHQ2-9): High Risk (02/24/2024)   Depression (PHQ2-9)    PHQ-2 Score: 12  Alcohol Screen: Low Risk (03/26/2023)   Alcohol Screen    Last Alcohol Screening Score (AUDIT): 1  Housing: Low Risk (04/30/2023)   Housing    Last Housing Risk Score: 0  Utilities: Not At Risk (04/30/2023)   AHC Utilities    Threatened with loss of utilities: No  Health Literacy: Adequate Health Literacy (03/26/2023)   B1300 Health Literacy    Frequency of need for help with medical instructions: Never      Family History  Problem Relation Age of Onset   Coronary artery disease Mother    Osteoporosis Mother    Heart disease Mother    Stroke Mother    Heart attack Mother    Hypertension Mother    Hypertension Father    Coronary artery  disease Father    COPD Father    Heart disease Father    Heart attack Father    Glaucoma Father    Osteoporosis Father    Emphysema Father    Cancer Sister 47       colon   Hypertension Sister    Colon cancer Sister        dx in her 4's   Breast cancer Sister    Colon polyps Sister    Stroke Brother    Hypertension Brother    Colon polyps Brother    Stroke Maternal Grandfather    Stroke Maternal Grandmother    Osteoporosis  Maternal Grandmother    Hypertension Maternal Grandmother    Hypertension Paternal Grandmother    Hypertension Paternal Grandfather    Breast cancer Maternal Aunt        2 aunts-50's   Esophageal cancer Neg Hx    Stomach cancer Neg Hx    Rectal cancer Neg Hx    Pancreatic cancer Neg Hx    Vitals:   06/09/24 0917  BP: (!) 148/88  Pulse: 81  SpO2: 98%  Weight: 194 lb (88 kg)   Wt Readings from Last 3 Encounters:  06/09/24 194 lb (88 kg)  04/07/24 192 lb 3.2 oz (87.2 kg)  02/24/24 192 lb 12.8 oz (87.5 kg)   Lab Results  Component Value Date   CREATININE 1.65 (H) 04/07/2024   CREATININE 1.91 (H) 01/07/2024   CREATININE 1.82 (H) 12/31/2023     PHYSICAL EXAM:  General: Well appearing.  Cor: No JVD. Regular rhythm, rate.  Lungs: clear Abdomen: soft, nontender, nondistended. Extremities: 1+ pitting edema bilateral lower legs Neuro:. Affect pleasant   EKG 04/07/24: NSR with PAC, HR 91    ASSESSMENT & PLAN:  1. Chronic heart failure with reduced ejection fraction- - Suspect the main driver here is severe untreated OSA +/- frequent PVCs. HST 1/25 was negative. (Noted heavy snoring but AHI < 1.0). Had in-lab sleep study 06/25 with AHI 3.7 - NYHA class II - euvolemic - weight up 2 pounds from last visit here 2 months ago - Echo 07/05/23: EF 45-50% in setting of AF RVR, mild LVH, normal RV, mild MR - cMRI 5/25 RV normal  - Continue entresto  49/51mg  BID - Continue jardiance  10mg  daily  - may not be able to tolerate beta blocker due to already experiencing fatigue - consider MRA although she has had right nephrectomy and renal function would need to be watched closely - encouraged to wear compression socks daily with removal at bedtime. Should pedal edema continue, could stop amlodipine  but would probably need hydralazine for BP control. She wants to try wearing compression hose daily.  - iron  infusion done 02/04/24 - BNP 10/29/23 was 317.1  2. OSA, untreated - unable to  tolerate CPAP in past but that was years ago - HST 2/25: AHI 0.7  Moderate to severe snoring sats down to 92%  - sleep study done 06/25: AHI 3.7. She says that she is unable to tolerate CPAP mask of any kind.  - Voices understanding that untreated sleep apnea can affect her heart  3. Frequent PVCs/SVT - Zio 1/25: Sinus rhythm. 1% AF 243 runs SVT PVCs 14.5%  - Zio 12/25: Multiple short runs of AF/ flutter, low PVC burden. Underlying rhythm was NSR.   4. PAF - continue Eliquis  5 bid. No bleeding  5. CAD s/p CABG - no s/s angina - continue Eliquis /statin/zetia / repatha  - cMRI 10/20/23 EF 58%  subendocardial (25-50% wall thickness) LGE mid-apical, anteroseptal segment and basal-mid inferolateral segment. RV ok mild MR - LDL 04/07/24 was 38  6. CKD 3b  - baseline Scr 1.6 in setting of solitary kidney with h/o R nephrectomy due to RCC - Continue entresto  - Continue jardiance  10mg  daily - saw nephrology Geoffry) 07/25  7. Obesity - BMI 35.04 kg/ m2 - unable to get GLP1 approved by insurance  8. HTN - BP 148/88 - saw PCP Ester) 10/25 - BMET 04/07/24 reviewed: sodium 140, potassium 4.7, creatinine 1.65 & GFR 32   Return in 3 months, sooner if needed.   I spent 35 minutes reviewing records, interviewing/ examing patient and managing plan/ orders.   Ellouise DELENA Class, FNP-C 06/08/24 "

## 2024-06-09 ENCOUNTER — Encounter: Payer: Self-pay | Admitting: Family

## 2024-06-09 ENCOUNTER — Ambulatory Visit: Payer: Self-pay | Admitting: Family

## 2024-06-09 ENCOUNTER — Ambulatory Visit: Admitting: Family

## 2024-06-09 VITALS — BP 148/88 | HR 81 | Wt 194.0 lb

## 2024-06-09 DIAGNOSIS — I471 Supraventricular tachycardia, unspecified: Secondary | ICD-10-CM | POA: Insufficient documentation

## 2024-06-09 DIAGNOSIS — I493 Ventricular premature depolarization: Secondary | ICD-10-CM | POA: Diagnosis not present

## 2024-06-09 DIAGNOSIS — I5022 Chronic systolic (congestive) heart failure: Secondary | ICD-10-CM | POA: Diagnosis present

## 2024-06-09 DIAGNOSIS — E785 Hyperlipidemia, unspecified: Secondary | ICD-10-CM | POA: Diagnosis not present

## 2024-06-09 DIAGNOSIS — N1832 Chronic kidney disease, stage 3b: Secondary | ICD-10-CM | POA: Diagnosis not present

## 2024-06-09 DIAGNOSIS — I25810 Atherosclerosis of coronary artery bypass graft(s) without angina pectoris: Secondary | ICD-10-CM | POA: Insufficient documentation

## 2024-06-09 DIAGNOSIS — E669 Obesity, unspecified: Secondary | ICD-10-CM | POA: Insufficient documentation

## 2024-06-09 DIAGNOSIS — Z905 Acquired absence of kidney: Secondary | ICD-10-CM | POA: Insufficient documentation

## 2024-06-09 DIAGNOSIS — I10 Essential (primary) hypertension: Secondary | ICD-10-CM

## 2024-06-09 DIAGNOSIS — I2510 Atherosclerotic heart disease of native coronary artery without angina pectoris: Secondary | ICD-10-CM

## 2024-06-09 DIAGNOSIS — D631 Anemia in chronic kidney disease: Secondary | ICD-10-CM | POA: Diagnosis not present

## 2024-06-09 DIAGNOSIS — Z6835 Body mass index (BMI) 35.0-35.9, adult: Secondary | ICD-10-CM | POA: Insufficient documentation

## 2024-06-09 DIAGNOSIS — Z85528 Personal history of other malignant neoplasm of kidney: Secondary | ICD-10-CM | POA: Insufficient documentation

## 2024-06-09 DIAGNOSIS — N2889 Other specified disorders of kidney and ureter: Secondary | ICD-10-CM | POA: Insufficient documentation

## 2024-06-09 DIAGNOSIS — Z951 Presence of aortocoronary bypass graft: Secondary | ICD-10-CM | POA: Diagnosis not present

## 2024-06-09 DIAGNOSIS — I252 Old myocardial infarction: Secondary | ICD-10-CM | POA: Insufficient documentation

## 2024-06-09 DIAGNOSIS — F40240 Claustrophobia: Secondary | ICD-10-CM | POA: Insufficient documentation

## 2024-06-09 DIAGNOSIS — G4733 Obstructive sleep apnea (adult) (pediatric): Secondary | ICD-10-CM | POA: Diagnosis not present

## 2024-06-09 DIAGNOSIS — Z79899 Other long term (current) drug therapy: Secondary | ICD-10-CM | POA: Diagnosis not present

## 2024-06-09 DIAGNOSIS — Z7901 Long term (current) use of anticoagulants: Secondary | ICD-10-CM | POA: Insufficient documentation

## 2024-06-09 DIAGNOSIS — I480 Paroxysmal atrial fibrillation: Secondary | ICD-10-CM | POA: Diagnosis not present

## 2024-06-09 DIAGNOSIS — I130 Hypertensive heart and chronic kidney disease with heart failure and stage 1 through stage 4 chronic kidney disease, or unspecified chronic kidney disease: Secondary | ICD-10-CM | POA: Insufficient documentation

## 2024-06-09 DIAGNOSIS — D509 Iron deficiency anemia, unspecified: Secondary | ICD-10-CM | POA: Insufficient documentation

## 2024-06-09 NOTE — Patient Instructions (Signed)
Wear compression socks daily with removal at bedtime

## 2024-06-16 ENCOUNTER — Other Ambulatory Visit: Payer: Self-pay

## 2024-06-16 ENCOUNTER — Encounter: Admitting: Nurse Practitioner

## 2024-06-16 MED ORDER — ISOSORBIDE MONONITRATE ER 30 MG PO TB24: 30.0000 mg | ORAL_TABLET | Freq: Every day | ORAL | 0 refills | Status: AC

## 2024-06-20 ENCOUNTER — Other Ambulatory Visit: Payer: Self-pay | Admitting: Internal Medicine

## 2024-06-27 ENCOUNTER — Telehealth: Payer: Self-pay | Admitting: Family

## 2024-06-30 ENCOUNTER — Ambulatory Visit: Admitting: Nurse Practitioner

## 2024-07-21 ENCOUNTER — Other Ambulatory Visit

## 2024-07-28 ENCOUNTER — Telehealth: Admitting: Nurse Practitioner

## 2024-08-14 ENCOUNTER — Ambulatory Visit

## 2024-09-08 ENCOUNTER — Ambulatory Visit: Admitting: Family

## 2024-09-15 ENCOUNTER — Ambulatory Visit: Admitting: Family
